# Patient Record
Sex: Female | Born: 1960 | Race: Black or African American | Hispanic: No | Marital: Married | Smoking: Never smoker
Health system: Southern US, Community
[De-identification: ages and names within clinical notes are randomized; demographics above are authoritative.]

## PROBLEM LIST (undated history)

## (undated) DIAGNOSIS — D649 Anemia, unspecified: Secondary | ICD-10-CM

## (undated) DIAGNOSIS — I10 Essential (primary) hypertension: Secondary | ICD-10-CM

## (undated) DIAGNOSIS — M1990 Unspecified osteoarthritis, unspecified site: Secondary | ICD-10-CM

## (undated) DIAGNOSIS — F32A Depression, unspecified: Secondary | ICD-10-CM

## (undated) DIAGNOSIS — T7840XA Allergy, unspecified, initial encounter: Secondary | ICD-10-CM

## (undated) DIAGNOSIS — J45909 Unspecified asthma, uncomplicated: Secondary | ICD-10-CM

## (undated) DIAGNOSIS — F329 Major depressive disorder, single episode, unspecified: Secondary | ICD-10-CM

## (undated) DIAGNOSIS — C801 Malignant (primary) neoplasm, unspecified: Secondary | ICD-10-CM

## (undated) DIAGNOSIS — F419 Anxiety disorder, unspecified: Secondary | ICD-10-CM

## (undated) HISTORY — PX: COLONOSCOPY: SHX174

## (undated) HISTORY — DX: Unspecified osteoarthritis, unspecified site: M19.90

## (undated) HISTORY — DX: Unspecified asthma, uncomplicated: J45.909

## (undated) HISTORY — DX: Allergy, unspecified, initial encounter: T78.40XA

## (undated) HISTORY — PX: HERNIA REPAIR: SHX51

## (undated) HISTORY — PX: TUBAL LIGATION: SHX77

## (undated) HISTORY — DX: Major depressive disorder, single episode, unspecified: F32.9

## (undated) HISTORY — DX: Depression, unspecified: F32.A

## (undated) HISTORY — DX: Anxiety disorder, unspecified: F41.9

## (undated) HISTORY — PX: ABDOMINAL HYSTERECTOMY: SHX81

---

## 1991-05-31 HISTORY — PX: CHOLECYSTECTOMY: SHX55

## 1997-08-15 ENCOUNTER — Other Ambulatory Visit: Admission: RE | Admit: 1997-08-15 | Discharge: 1997-08-15 | Payer: Self-pay | Admitting: Family Medicine

## 1998-08-27 ENCOUNTER — Other Ambulatory Visit: Admission: RE | Admit: 1998-08-27 | Discharge: 1998-08-27 | Payer: Self-pay | Admitting: Family Medicine

## 2000-02-25 ENCOUNTER — Encounter: Payer: Self-pay | Admitting: *Deleted

## 2000-02-25 ENCOUNTER — Inpatient Hospital Stay (HOSPITAL_COMMUNITY): Admission: AD | Admit: 2000-02-25 | Discharge: 2000-02-28 | Payer: Self-pay | Admitting: Obstetrics & Gynecology

## 2000-03-27 ENCOUNTER — Other Ambulatory Visit: Admission: RE | Admit: 2000-03-27 | Discharge: 2000-03-27 | Payer: Self-pay | Admitting: *Deleted

## 2000-05-15 ENCOUNTER — Ambulatory Visit (HOSPITAL_COMMUNITY): Admission: RE | Admit: 2000-05-15 | Discharge: 2000-05-15 | Payer: Self-pay | Admitting: Obstetrics and Gynecology

## 2000-05-15 ENCOUNTER — Encounter: Payer: Self-pay | Admitting: Obstetrics and Gynecology

## 2000-10-02 ENCOUNTER — Inpatient Hospital Stay (HOSPITAL_COMMUNITY): Admission: AD | Admit: 2000-10-02 | Discharge: 2000-10-04 | Payer: Self-pay | Admitting: Obstetrics and Gynecology

## 2000-10-08 ENCOUNTER — Encounter: Admission: RE | Admit: 2000-10-08 | Discharge: 2000-11-07 | Payer: Self-pay | Admitting: Obstetrics and Gynecology

## 2011-04-27 ENCOUNTER — Other Ambulatory Visit: Payer: Self-pay | Admitting: Internal Medicine

## 2011-04-27 DIAGNOSIS — Z1231 Encounter for screening mammogram for malignant neoplasm of breast: Secondary | ICD-10-CM

## 2011-05-16 ENCOUNTER — Ambulatory Visit
Admission: RE | Admit: 2011-05-16 | Discharge: 2011-05-16 | Disposition: A | Discharge status: Home / Self Care | Payer: Self-pay | Source: Ambulatory Visit | Attending: Internal Medicine | Admitting: Internal Medicine

## 2011-05-16 DIAGNOSIS — Z1231 Encounter for screening mammogram for malignant neoplasm of breast: Secondary | ICD-10-CM

## 2012-04-23 ENCOUNTER — Other Ambulatory Visit: Payer: Self-pay | Admitting: Obstetrics & Gynecology

## 2012-04-23 DIAGNOSIS — Z1231 Encounter for screening mammogram for malignant neoplasm of breast: Secondary | ICD-10-CM

## 2012-05-29 ENCOUNTER — Ambulatory Visit: Payer: Self-pay

## 2012-06-19 ENCOUNTER — Ambulatory Visit
Admission: RE | Admit: 2012-06-19 | Discharge: 2012-06-19 | Disposition: A | Discharge status: Home / Self Care | Payer: 59 | Source: Ambulatory Visit | Attending: Obstetrics & Gynecology | Admitting: Obstetrics & Gynecology

## 2012-06-19 DIAGNOSIS — Z1231 Encounter for screening mammogram for malignant neoplasm of breast: Secondary | ICD-10-CM

## 2013-04-19 ENCOUNTER — Other Ambulatory Visit: Payer: Self-pay | Admitting: Dermatology

## 2013-05-08 ENCOUNTER — Other Ambulatory Visit: Payer: Self-pay | Admitting: Internal Medicine

## 2013-05-08 DIAGNOSIS — E2839 Other primary ovarian failure: Secondary | ICD-10-CM

## 2013-06-11 ENCOUNTER — Other Ambulatory Visit: Payer: 59

## 2013-08-09 ENCOUNTER — Ambulatory Visit
Admission: RE | Admit: 2013-08-09 | Discharge: 2013-08-09 | Disposition: A | Discharge status: Home / Self Care | Payer: 59 | Source: Ambulatory Visit | Attending: Internal Medicine | Admitting: Internal Medicine

## 2013-08-09 DIAGNOSIS — E2839 Other primary ovarian failure: Secondary | ICD-10-CM

## 2013-08-19 ENCOUNTER — Ambulatory Visit (HOSPITAL_COMMUNITY)
Admission: RE | Admit: 2013-08-19 | Discharge: 2013-08-19 | Disposition: A | Discharge status: Home / Self Care | Payer: 59 | Source: Ambulatory Visit | Attending: Internal Medicine | Admitting: Internal Medicine

## 2013-08-19 ENCOUNTER — Other Ambulatory Visit: Payer: Self-pay

## 2013-08-19 ENCOUNTER — Other Ambulatory Visit (HOSPITAL_COMMUNITY): Payer: Self-pay | Admitting: Internal Medicine

## 2013-08-19 DIAGNOSIS — M1990 Unspecified osteoarthritis, unspecified site: Secondary | ICD-10-CM

## 2013-08-19 DIAGNOSIS — M25469 Effusion, unspecified knee: Secondary | ICD-10-CM | POA: Insufficient documentation

## 2013-08-19 DIAGNOSIS — Z1231 Encounter for screening mammogram for malignant neoplasm of breast: Secondary | ICD-10-CM

## 2013-08-19 DIAGNOSIS — M25569 Pain in unspecified knee: Secondary | ICD-10-CM | POA: Insufficient documentation

## 2013-08-19 DIAGNOSIS — M2340 Loose body in knee, unspecified knee: Secondary | ICD-10-CM | POA: Insufficient documentation

## 2013-08-19 DIAGNOSIS — M19019 Primary osteoarthritis, unspecified shoulder: Secondary | ICD-10-CM | POA: Insufficient documentation

## 2013-08-30 ENCOUNTER — Ambulatory Visit
Admission: RE | Admit: 2013-08-30 | Discharge: 2013-08-30 | Disposition: A | Discharge status: Home / Self Care | Payer: 59 | Source: Ambulatory Visit

## 2013-08-30 DIAGNOSIS — Z1231 Encounter for screening mammogram for malignant neoplasm of breast: Secondary | ICD-10-CM

## 2013-10-02 ENCOUNTER — Encounter: Payer: Self-pay | Admitting: Gastroenterology

## 2013-11-04 ENCOUNTER — Ambulatory Visit (AMBULATORY_SURGERY_CENTER): Payer: 59 | Admitting: *Deleted

## 2013-11-04 VITALS — Ht 65.0 in | Wt 239.4 lb

## 2013-11-04 DIAGNOSIS — Z1211 Encounter for screening for malignant neoplasm of colon: Secondary | ICD-10-CM

## 2013-11-04 MED ORDER — MOVIPREP 100 G PO SOLR: ORAL | Status: DC

## 2013-11-04 NOTE — Progress Notes (Signed)
Patient denies any allergies to eggs or soy. Patient denies any problems with anesthesia/sedation. Patient denies any oxygen use at home and does not take any diet/weight loss medications. EMMI education assisgned to patient on colonoscopy, this was explained and instructions given to patient. 

## 2013-11-13 ENCOUNTER — Encounter: Payer: Self-pay | Admitting: Gastroenterology

## 2013-11-18 ENCOUNTER — Encounter: Payer: Self-pay | Admitting: Gastroenterology

## 2013-11-18 ENCOUNTER — Ambulatory Visit (AMBULATORY_SURGERY_CENTER): Payer: 59 | Admitting: Gastroenterology

## 2013-11-18 VITALS — BP 143/84 | HR 73 | Temp 97.7°F | Resp 14 | Ht 68.0 in | Wt 239.0 lb

## 2013-11-18 DIAGNOSIS — Z1211 Encounter for screening for malignant neoplasm of colon: Secondary | ICD-10-CM

## 2013-11-18 MED ORDER — SODIUM CHLORIDE 0.9 % IV SOLN: 500.0000 mL | INTRAVENOUS | Status: DC

## 2013-11-18 NOTE — Patient Instructions (Signed)
YOU HAD AN ENDOSCOPIC PROCEDURE TODAY AT THE [facility]: Refer to the procedure report that was given to you for any specific questions about what was found during the examination.  If the procedure report does not answer your questions, please call your gastroenterologist to clarify.  If you requested that your care partner not be given the details of your procedure findings, then the procedure report has been included in a sealed envelope for you to review at your convenience later.  YOU SHOULD EXPECT: Some feelings of bloating in the abdomen. Passage of more gas than usual.  Walking can help get rid of the air that was put into your GI tract during the procedure and reduce the bloating. If you had a lower endoscopy (such as a colonoscopy or flexible sigmoidoscopy) you may notice spotting of blood in your stool or on the toilet paper. If you underwent a bowel prep for your procedure, then you may not have a normal bowel movement for a few days.  DIET: Your first meal following the procedure should be a light meal and then it is ok to progress to your normal diet.  A half-sandwich or bowl of soup is an example of a good first meal.  Heavy or fried foods are harder to digest and may make you feel nauseous or bloated.  Likewise meals heavy in dairy and vegetables can cause extra gas to form and this can also increase the bloating.  Drink plenty of fluids but you should avoid alcoholic beverages for 24 hours.  ACTIVITY: Your care partner should take you home directly after the procedure.  You should plan to take it easy, moving slowly for the rest of the day.  You can resume normal activity the day after the procedure however you should NOT DRIVE or use heavy machinery for 24 hours (because of the sedation medicines used during the test).    SYMPTOMS TO REPORT IMMEDIATELY: A gastroenterologist can be reached at any hour.  During normal business hours, 8:30 AM to 5:00 PM Monday through Friday,  call [phone].  After hours and on weekends, please call the GI answering service at [phone] who will take a message and have the physician on call contact you.   Following lower endoscopy (colonoscopy or flexible sigmoidoscopy):  Excessive amounts of blood in the stool  Significant tenderness or worsening of abdominal pains  Swelling of the abdomen that is new, acute  Fever of 100F or higher    FOLLOW UP: If any biopsies were taken you will be contacted by phone or by letter within the next 1-3 weeks.  Call your gastroenterologist if you have not heard about the biopsies in 3 weeks.  Our staff will call the home number listed on your records the next business day following your procedure to check on you and address any questions or concerns that you may have at that time regarding the information given to you following your procedure. This is a courtesy call and so if there is no answer at the home number and we have not heard from you through the emergency physician on call, we will assume that you have returned to your regular daily activities without incident.  SIGNATURES/CONFIDENTIALITY: You and/or your care partner have signed paperwork which will be entered into your electronic medical record.  These signatures attest to the fact that that the information above on your After Visit Summary has been reviewed and is understood.  Full responsibility of the confidentiality   of this discharge information lies with you and/or your care-partner.     

## 2013-11-18 NOTE — Op Note (Signed)
[facility]  [address] [place], [id number]   COLONOSCOPY PROCEDURE REPORT  PATIENT: Hannah, [name]  MR#: [id number] BIRTHDATE: 09-05-1960 , 52  yrs. old GENDER: Female ENDOSCOPIST: Milus Banister, MD REFERRED DP:OEUMP Jeanie Cooks, M.D. PROCEDURE DATE:  11/18/2013 PROCEDURE:   Colonoscopy, screening First Screening Colonoscopy - Avg.  risk and is 50 yrs.  old or older Yes.  Prior Negative Screening - Now for repeat screening. N/A  History of Adenoma - Now for follow-up colonoscopy & has been > or = to 3 yrs.  N/A  Polyps Removed Today? No.  Recommend repeat exam, <10 yrs? No. ASA CLASS:   Class II INDICATIONS:average risk screening. MEDICATIONS: MAC sedation, administered by CRNA and Propofol (Diprivan) 230 mg IV  DESCRIPTION OF PROCEDURE:   After the risks benefits and alternatives of the procedure were thoroughly explained, informed consent was obtained.  A digital rectal exam revealed no abnormalities of the rectum.   The LB [id number]  endoscope was introduced through the anus and advanced to the cecum, which was identified by both the appendix and ileocecal valve. No adverse events experienced.   The quality of the prep was good.  The instrument was then slowly withdrawn as the colon was fully examined.   COLON FINDINGS: A normal appearing cecum, ileocecal valve, and appendiceal orifice were identified.  The ascending, hepatic flexure, transverse, splenic flexure, descending, sigmoid colon and rectum appeared unremarkable.  No polyps or cancers were seen. Retroflexed views revealed no abnormalities. The time to cecum=2 minutes 42 seconds.  Withdrawal time=6 minutes 24 seconds.  The scope was withdrawn and the procedure completed. COMPLICATIONS: There were no complications.  ENDOSCOPIC IMPRESSION: Normal colon No polyps or cancers  RECOMMENDATIONS: You should continue to follow colorectal cancer screening guidelines for "routine risk"  patients with a repeat colonoscopy in 10 years.   eSigned:  Milus Banister, MD 11/18/2013 9:32 AM

## 2013-11-18 NOTE — Progress Notes (Signed)
Report to PACU, RN, vss, BBS= Clear.  

## 2013-11-19 ENCOUNTER — Telehealth: Payer: Self-pay | Admitting: *Deleted

## 2013-11-19 NOTE — Telephone Encounter (Signed)
  Follow up Call-  Call back number 11/18/2013  Post procedure Call Back phone  # [phone]  Permission to leave phone message Yes     Patient questions:  Do you have a fever, pain , or abdominal swelling? no Pain Score  0 *  Have you tolerated food without any problems? yes  Have you been able to return to your normal activities? yes  Do you have any questions about your discharge instructions: Diet   no Medications  no Follow up visit  no  Do you have questions or concerns about your Care? no  Actions: * If pain score is 4 or above: No action needed, pain <4.

## 2014-02-19 ENCOUNTER — Ambulatory Visit: Payer: 59 | Admitting: Obstetrics & Gynecology

## 2014-02-26 ENCOUNTER — Encounter: Payer: Self-pay | Admitting: Obstetrics & Gynecology

## 2014-02-26 ENCOUNTER — Ambulatory Visit (INDEPENDENT_AMBULATORY_CARE_PROVIDER_SITE_OTHER): Payer: 59 | Admitting: Obstetrics & Gynecology

## 2014-02-26 ENCOUNTER — Encounter: Payer: Self-pay | Admitting: *Deleted

## 2014-02-26 VITALS — Ht 65.0 in | Wt 245.0 lb

## 2014-02-26 DIAGNOSIS — Z01419 Encounter for gynecological examination (general) (routine) without abnormal findings: Secondary | ICD-10-CM

## 2014-02-26 NOTE — Patient Instructions (Signed)
Menopause Menopause is the normal time of life when menstrual periods stop completely. Menopause is complete when you have missed 12 consecutive menstrual periods. It usually occurs between the ages of 48 years and 55 years. Very rarely does a woman develop menopause before the age of 40 years. At menopause, your ovaries stop producing the female hormones estrogen and progesterone. This can cause undesirable symptoms and also affect your health. Sometimes the symptoms may occur 4-5 years before the menopause begins. There is no relationship between menopause and:  Oral contraceptives.  Number of children you had.  Race.  The age your menstrual periods started (menarche). Heavy smokers and very thin women may develop menopause earlier in life. CAUSES  The ovaries stop producing the female hormones estrogen and progesterone.  Other causes include:  Surgery to remove both ovaries.  The ovaries stop functioning for no known reason.  Tumors of the pituitary gland in the brain.  Medical disease that affects the ovaries and hormone production.  Radiation treatment to the abdomen or pelvis.  Chemotherapy that affects the ovaries. SYMPTOMS   Hot flashes.  Night sweats.  Decrease in sex drive.  Vaginal dryness and thinning of the vagina causing painful intercourse.  Dryness of the skin and developing wrinkles.  Headaches.  Tiredness.  Irritability.  Memory problems.  Weight gain.  Bladder infections.  Hair growth of the face and chest.  Infertility. More serious symptoms include:  Loss of bone (osteoporosis) causing breaks (fractures).  Depression.  Hardening and narrowing of the arteries (atherosclerosis) causing heart attacks and strokes. DIAGNOSIS   When the menstrual periods have stopped for 12 straight months.  Physical exam.  Hormone studies of the blood. TREATMENT  There are many treatment choices and nearly as many questions about them. The  decisions to treat or not to treat menopausal changes is an individual choice made with your health care provider. Your health care provider can discuss the treatments with you. Together, you can decide which treatment will work best for you. Your treatment choices may include:   Hormone therapy (estrogen and progesterone).  Non-hormonal medicines.  Treating the individual symptoms with medicine (for example antidepressants for depression).  Herbal medicines that may help specific symptoms.  Counseling by a psychiatrist or psychologist.  Group therapy.  Lifestyle changes including:  Eating healthy.  Regular exercise.  Limiting caffeine and alcohol.  Stress management and meditation.  No treatment. HOME CARE INSTRUCTIONS   Take the medicine your health care provider gives you as directed.  Get plenty of sleep and rest.  Exercise regularly.  Eat a diet that contains calcium (good for the bones) and soy products (acts like estrogen hormone).  Avoid alcoholic beverages.  Do not smoke.  If you have hot flashes, dress in layers.  Take supplements, calcium, and vitamin D to strengthen bones.  You can use over-the-counter lubricants or moisturizers for vaginal dryness.  Group therapy is sometimes very helpful.  Acupuncture may be helpful in some cases. SEEK MEDICAL CARE IF:   You are not sure you are in menopause.  You are having menopausal symptoms and need advice and treatment.  You are still having menstrual periods after age 55 years.  You have pain with intercourse.  Menopause is complete (no menstrual period for 12 months) and you develop vaginal bleeding.  You need a referral to a specialist (gynecologist, psychiatrist, or psychologist) for treatment. SEEK IMMEDIATE MEDICAL CARE IF:   You have severe depression.  You have excessive vaginal bleeding.    You fell and think you have a broken bone.  You have pain when you urinate.  You develop leg or  chest pain.  You have a fast pounding heart beat (palpitations).  You have severe headaches.  You develop vision problems.  You feel a lump in your breast.  You have abdominal pain or severe indigestion. Document Released: 08/06/2003 Document Revised: 01/16/2013 Document Reviewed: 12/13/2012 ExitCare Patient Information 2015 [employer]. This information is not intended to replace advice given to you by your health care provider. Make sure you discuss any questions you have with your health care provider.  

## 2014-02-26 NOTE — Progress Notes (Signed)
Subjective:     Hannah [name] is a 53 y.o. female here for a routine exam.  Current complaints: none.    Personal health questionnaire:  Is patient Hannah [name], have a family history of breast and/or ovarian cancer: no Is there a family history of uterine cancer diagnosed at age < 53, gastrointestinal cancer, urinary tract cancer, family member who is a Field seismologist syndrome-associated carrier: no Is the patient overweight and hypertensive, family history of diabetes, personal history of gestational diabetes or PCOS: yes Is patient over 69, have PCOS,  family history of premature CHD under age 19, diabetes, smoke, have hypertension or peripheral artery disease:  no At any time, has a partner hit, kicked or otherwise hurt or frightened you?: no Over the past 2 weeks, have you felt down, depressed or hopeless?: no Over the past 2 weeks, have you felt little interest or pleasure in doing things?:no   Gynecologic History Patient's last menstrual period was 02/15/2014. Last Pap results were: normal Last mammogram: 4/15. Results were: normal  Obstetric History OB History  Gravida Para Term Preterm AB SAB TAB Ectopic Multiple Living  5 3 3  2  2   3     # Outcome Date GA Lbr Len/2nd Weight Sex Delivery Anes PTL Lv  5 TRM         Y  4 TRM         Y  3 TRM         Y  2 TAB           1 TAB                Past Medical History  Diagnosis Date  . Allergy   . Asthma   . Arthritis     Past Surgical History  Procedure Laterality Date  . Cholecystectomy  1993  . Tubal ligation      Current outpatient prescriptions:meloxicam (MOBIC) 7.5 MG tablet, Take 7.5 mg by mouth daily., Disp: , Rfl: ;  Multiple Vitamin (MULTIVITAMIN) tablet, Take 1 tablet by mouth daily., Disp: , Rfl: ;  VITAMIN D, CHOLECALCIFEROL, PO, Take 1 tablet by mouth once a week., Disp: , Rfl:  Allergies  Allergen Reactions  . Sulfur Swelling    History  Substance Use Topics  . Smoking status: Never Smoker   . Smokeless  tobacco: Never Used  . Alcohol Use: No    Family History  Problem Relation Age of Onset  . Colon cancer Neg Hx   . Asthma Mother       Review of Systems  Constitutional: negative for fatigue and weight loss Respiratory: negative for cough and wheezing Cardiovascular: negative for chest pain, fatigue and palpitations Gastrointestinal: negative for abdominal pain and change in bowel habits Musculoskeletal:negative for myalgias Neurological: negative for gait problems and tremors Behavioral/Psych: negative for abusive relationship, depression Endocrine: negative for temperature intolerance   Genitourinary:negative for abnormal menstrual periods, genital lesions, hot flashes, sexual problems and vaginal discharge Integument/breast: negative for breast lump, breast tenderness, nipple discharge and skin lesion(s)    Objective:       Ht 5\' 5"  (1.651 m)  Wt 111.131 kg (245 lb)  BMI 40.77 kg/m2  LMP 02/15/2014 General:   alert  Skin:   no rash or abnormalities  Lungs:   clear to auscultation bilaterally  Heart:   regular rate and rhythm, S1, S2 normal, no murmur, click, rub or gallop  Breasts:   normal without suspicious masses, skin or nipple changes  or axillary nodes  Abdomen:  normal findings: no organomegaly, soft, non-tender and no hernia  Pelvis:  External genitalia: normal general appearance Urinary system: urethral meatus normal and bladder without fullness, nontender Vaginal: normal without tenderness, induration or masses Cervix: normal appearance Adnexa: normal bimanual exam Uterus: anteverted and non-tender, normal size   Lab Review Urine pregnancy test Labs reviewed no Radiologic studies reviewed yes    Assessment:    Healthy female exam.    Plan:    Education reviewed: calcium supplements, low fat, low cholesterol diet and weight bearing exercise.   Meds ordered this encounter  Medications  . meloxicam (MOBIC) 7.5 MG tablet    Sig: Take 7.5 mg by  mouth daily.   Follow up as needed.

## 2014-02-27 LAB — PAP IG AND HPV HIGH-RISK: HPV DNA HIGH RISK: NOT DETECTED

## 2014-03-31 ENCOUNTER — Encounter: Payer: Self-pay | Admitting: Obstetrics & Gynecology

## 2014-05-26 ENCOUNTER — Encounter: Payer: Self-pay | Admitting: *Deleted

## 2014-05-27 ENCOUNTER — Encounter: Payer: Self-pay | Admitting: Obstetrics & Gynecology

## 2014-06-12 ENCOUNTER — Encounter (HOSPITAL_BASED_OUTPATIENT_CLINIC_OR_DEPARTMENT_OTHER): Payer: Self-pay | Admitting: *Deleted

## 2014-06-12 ENCOUNTER — Other Ambulatory Visit (HOSPITAL_BASED_OUTPATIENT_CLINIC_OR_DEPARTMENT_OTHER): Payer: Self-pay | Admitting: Orthopaedic Surgery

## 2014-06-12 NOTE — Progress Notes (Signed)
No labs needed

## 2014-06-18 ENCOUNTER — Encounter (HOSPITAL_BASED_OUTPATIENT_CLINIC_OR_DEPARTMENT_OTHER): Payer: Self-pay | Admitting: Anesthesiology

## 2014-06-18 ENCOUNTER — Ambulatory Visit (HOSPITAL_BASED_OUTPATIENT_CLINIC_OR_DEPARTMENT_OTHER): Payer: 59 | Admitting: Anesthesiology

## 2014-06-18 ENCOUNTER — Ambulatory Visit (HOSPITAL_BASED_OUTPATIENT_CLINIC_OR_DEPARTMENT_OTHER)
Admission: RE | Admit: 2014-06-18 | Discharge: 2014-06-18 | Disposition: A | Discharge status: Home / Self Care | Payer: 59 | Source: Ambulatory Visit | Attending: Orthopaedic Surgery | Admitting: Orthopaedic Surgery

## 2014-06-18 ENCOUNTER — Encounter (HOSPITAL_BASED_OUTPATIENT_CLINIC_OR_DEPARTMENT_OTHER)
Admission: RE | Disposition: A | Discharge status: Home / Self Care | Payer: Self-pay | Source: Ambulatory Visit | Attending: Orthopaedic Surgery

## 2014-06-18 DIAGNOSIS — M65331 Trigger finger, right middle finger: Secondary | ICD-10-CM | POA: Diagnosis not present

## 2014-06-18 DIAGNOSIS — Z6841 Body Mass Index (BMI) 40.0 and over, adult: Secondary | ICD-10-CM | POA: Diagnosis not present

## 2014-06-18 DIAGNOSIS — J45909 Unspecified asthma, uncomplicated: Secondary | ICD-10-CM | POA: Insufficient documentation

## 2014-06-18 DIAGNOSIS — M1990 Unspecified osteoarthritis, unspecified site: Secondary | ICD-10-CM | POA: Diagnosis not present

## 2014-06-18 HISTORY — PX: TRIGGER FINGER RELEASE: SHX641

## 2014-06-18 LAB — POCT HEMOGLOBIN-HEMACUE: HEMOGLOBIN: 11.3 g/dL — AB (ref 12.0–15.0)

## 2014-06-18 SURGERY — RELEASE, A1 PULLEY, FOR TRIGGER FINGER
Anesthesia: Monitor Anesthesia Care | Site: Finger | Laterality: Right

## 2014-06-18 MED ORDER — HYDROMORPHONE HCL 1 MG/ML IJ SOLN: 0.2500 mg | INTRAMUSCULAR | Status: DC | PRN

## 2014-06-18 MED ORDER — OXYCODONE HCL 5 MG/5ML PO SOLN: 5.0000 mg | Freq: Once | ORAL | Status: DC | PRN

## 2014-06-18 MED ORDER — PROPOFOL INFUSION 10 MG/ML OPTIME
INTRAVENOUS | Status: DC | PRN
  Administered 2014-06-18: 100 ug/kg/min via INTRAVENOUS

## 2014-06-18 MED ORDER — OXYCODONE HCL 5 MG PO TABS: 5.0000 mg | ORAL_TABLET | Freq: Once | ORAL | Status: DC | PRN

## 2014-06-18 MED ORDER — LIDOCAINE HCL (PF) 0.5 % IJ SOLN
INTRAMUSCULAR | Status: DC | PRN
  Administered 2014-06-18: 30 mL via INTRAVENOUS

## 2014-06-18 MED ORDER — CEFAZOLIN SODIUM-DEXTROSE 2-3 GM-% IV SOLR
INTRAVENOUS | Status: AC
  Filled 2014-06-18: qty 50

## 2014-06-18 MED ORDER — CEFAZOLIN SODIUM-DEXTROSE 2-3 GM-% IV SOLR
2.0000 g | INTRAVENOUS | Status: AC
  Administered 2014-06-18: 2 g via INTRAVENOUS

## 2014-06-18 MED ORDER — 0.9 % SODIUM CHLORIDE (POUR BTL) OPTIME
TOPICAL | Status: DC | PRN
  Administered 2014-06-18: 120 mL

## 2014-06-18 MED ORDER — ONDANSETRON HCL 4 MG/2ML IJ SOLN: 4.0000 mg | Freq: Once | INTRAMUSCULAR | Status: DC | PRN

## 2014-06-18 MED ORDER — LACTATED RINGERS IV SOLN
INTRAVENOUS | Status: DC
  Administered 2014-06-18: 08:00:00 via INTRAVENOUS

## 2014-06-18 MED ORDER — MIDAZOLAM HCL 5 MG/5ML IJ SOLN
INTRAMUSCULAR | Status: DC | PRN
  Administered 2014-06-18: 2 mg via INTRAVENOUS

## 2014-06-18 MED ORDER — BUPIVACAINE HCL (PF) 0.25 % IJ SOLN
INTRAMUSCULAR | Status: DC | PRN
  Administered 2014-06-18: 6 mL

## 2014-06-18 MED ORDER — FENTANYL CITRATE 0.05 MG/ML IJ SOLN
INTRAMUSCULAR | Status: DC | PRN
  Administered 2014-06-18: 100 ug via INTRAVENOUS

## 2014-06-18 MED ORDER — MIDAZOLAM HCL 2 MG/2ML IJ SOLN
INTRAMUSCULAR | Status: AC
  Filled 2014-06-18: qty 2

## 2014-06-18 MED ORDER — BUPIVACAINE HCL (PF) 0.25 % IJ SOLN
INTRAMUSCULAR | Status: AC
  Filled 2014-06-18: qty 30

## 2014-06-18 MED ORDER — FENTANYL CITRATE 0.05 MG/ML IJ SOLN: 50.0000 ug | INTRAMUSCULAR | Status: DC | PRN

## 2014-06-18 MED ORDER — HYDROCODONE-ACETAMINOPHEN 5-325 MG PO TABS
1.0000 | ORAL_TABLET | Freq: Four times a day (QID) | ORAL | Status: DC | PRN
Start: 2014-06-18 — End: 2015-03-12

## 2014-06-18 MED ORDER — FENTANYL CITRATE 0.05 MG/ML IJ SOLN
INTRAMUSCULAR | Status: AC
  Filled 2014-06-18: qty 4

## 2014-06-18 MED ORDER — MIDAZOLAM HCL 2 MG/2ML IJ SOLN: 1.0000 mg | INTRAMUSCULAR | Status: DC | PRN

## 2014-06-18 MED ORDER — PROPOFOL 10 MG/ML IV BOLUS
INTRAVENOUS | Status: AC
  Filled 2014-06-18: qty 20

## 2014-06-18 MED ORDER — ONDANSETRON HCL 4 MG/2ML IJ SOLN
INTRAMUSCULAR | Status: DC | PRN
  Administered 2014-06-18: 4 mg via INTRAVENOUS

## 2014-06-18 SURGICAL SUPPLY — 40 items
BANDAGE ELASTIC 3 VELCRO ST LF (GAUZE/BANDAGES/DRESSINGS) ×2 IMPLANT
BLADE MINI RND TIP GREEN BEAV (BLADE) ×1 IMPLANT
BLADE SURG 15 STRL LF DISP TIS (BLADE) ×1 IMPLANT
BLADE SURG 15 STRL SS (BLADE) ×2
BNDG CMPR 9X4 STRL LF SNTH (GAUZE/BANDAGES/DRESSINGS) ×1
BNDG ESMARK 4X9 LF (GAUZE/BANDAGES/DRESSINGS) ×2 IMPLANT
BRUSH SCRUB EZ PLAIN DRY (MISCELLANEOUS) ×2 IMPLANT
CORDS BIPOLAR (ELECTRODE) ×2 IMPLANT
COVER BACK TABLE 60X90IN (DRAPES) ×2 IMPLANT
COVER MAYO STAND STRL (DRAPES) ×2 IMPLANT
CUFF TOURNIQUET SINGLE 18IN (TOURNIQUET CUFF) ×1 IMPLANT
DECANTER SPIKE VIAL GLASS SM (MISCELLANEOUS) ×1 IMPLANT
DRAPE EXTREMITY T 121X128X90 (DRAPE) ×2 IMPLANT
DRAPE SURG 17X23 STRL (DRAPES) ×2 IMPLANT
GAUZE SPONGE 4X4 12PLY STRL (GAUZE/BANDAGES/DRESSINGS) ×2 IMPLANT
GAUZE SPONGE 4X4 16PLY XRAY LF (GAUZE/BANDAGES/DRESSINGS) IMPLANT
GAUZE XEROFORM 1X8 LF (GAUZE/BANDAGES/DRESSINGS) ×2 IMPLANT
GLOVE BIOGEL PI IND STRL 7.0 (GLOVE) IMPLANT
GLOVE BIOGEL PI INDICATOR 7.0 (GLOVE) ×1
GLOVE ECLIPSE 6.5 STRL STRAW (GLOVE) ×1 IMPLANT
GLOVE EXAM NITRILE MD LF STRL (GLOVE) ×1 IMPLANT
GLOVE NEODERM STRL 7.5 LF PF (GLOVE) ×1 IMPLANT
GLOVE SURG NEODERM 7.5  LF PF (GLOVE) ×1
GLOVE SURG SYN 7.5  E (GLOVE) ×1
GLOVE SURG SYN 7.5 E (GLOVE) ×1 IMPLANT
GLOVE SURG SYN 7.5 PF PI (GLOVE) ×1 IMPLANT
GOWN PREVENTION PLUS XLARGE (GOWN DISPOSABLE) ×2 IMPLANT
GOWN STRL REIN XL XLG (GOWN DISPOSABLE) ×2 IMPLANT
NEEDLE HYPO 22GX1.5 SAFETY (NEEDLE) ×1 IMPLANT
NS IRRIG 1000ML POUR BTL (IV SOLUTION) ×2 IMPLANT
PACK BASIN DAY SURGERY FS (CUSTOM PROCEDURE TRAY) ×2 IMPLANT
PAD CAST 3X4 CTTN HI CHSV (CAST SUPPLIES) ×1 IMPLANT
PADDING CAST COTTON 3X4 STRL (CAST SUPPLIES) ×2
STOCKINETTE 4X48 STRL (DRAPES) ×2 IMPLANT
SUT ETHILON 4 0 PS 2 18 (SUTURE) ×1 IMPLANT
SYR BULB 3OZ (MISCELLANEOUS) ×2 IMPLANT
SYR CONTROL 10ML LL (SYRINGE) ×1 IMPLANT
TOWEL OR 17X24 6PK STRL BLUE (TOWEL DISPOSABLE) ×4 IMPLANT
TRAY DSU PREP LF (CUSTOM PROCEDURE TRAY) ×2 IMPLANT
UNDERPAD 30X30 INCONTINENT (UNDERPADS AND DIAPERS) ×2 IMPLANT

## 2014-06-18 NOTE — Op Note (Signed)
Date of surgery: 06/18/2014  Preoperative diagnosis: Right long trigger finger  Postoperative diagnosis: Same  Procedure: Incision of A1 pulley of right long trigger finger  Surgeon: Eduard Roux, M.D.  Anesthesia: Bier block  Estimated blood loss: Minimal  Complications: None next  Condition to PACU: Stable  Indications for procedure: Mr. Zettlemoyer is a 54 year old female who presents for surgical treatment of her trigger finger after failing conservative treatment. The risks benefits alternatives to surgery were discussed with the patient and she understood and wished to proceed.  Description of procedure: The patient was identified in the preoperative holding area. The operative site was marked by the surgeon confirmed with the patient. He is brought back to the operating room. She was placed supine on table. A nonsterile tourniquet was placed on the upper forearm. The extremity was exsanguinated using Esmarch bandage and the tourniquet was inflated to 250 mmHg. The Bier block was administered. The right upper extremity was prepped and draped in standard sterile fashion. Timeout was performed. Antibiotics were given. Timeout was performed. A horizontal incision based in the distal palmar crease in line with the long finger was used. Blunt dissection was taken down to the level of the flexor tendon. The neurovascular bundles were identified on each side of the tendon sheath and protected. The possible edge of the A1 pulley was identified. This was sharply incised. Of note the tendon was of good quality and did not exhibit any tears. The A1 pulley was incised along its full width. Care was taken not to violate the A2 pulley. The palmar pulley was then visualized and released also. The tourniquet was then deflated and hemostasis was obtained. Local anesthesia was infiltrated. The wound was thoroughly irrigated and closed with 3-0 nylon sutures. Sterile dressings were applied and the hand was placed in  a soft dressing. Patient tolerated the procedure well and was taken to the PACU in stable condition.  Postoperative plan: Patient will be weightbearing as tolerated to the right hand she is to avoid heavy lifting for 4 weeks. We will follow-up with her for suture removal as scheduled.  Azucena Cecil, MD [place] 9:29 AM

## 2014-06-18 NOTE — Transfer of Care (Signed)
Immediate Anesthesia Transfer of Care Note  Patient: Hannah [name]  Procedure(s) Performed: Procedure(s): RIGHT LONG FINGER TRIGGER RELEASE (Right)  Patient Location: PACU  Anesthesia Type:MAC and Bier block  Level of Consciousness: sedated  Airway & Oxygen Therapy: Patient Spontanous Breathing and Patient connected to face mask oxygen  Post-op Assessment: Report given to PACU RN and Post -op Vital signs reviewed and stable  Post vital signs: Reviewed and stable  Complications: No apparent anesthesia complications

## 2014-06-18 NOTE — Anesthesia Preprocedure Evaluation (Signed)
Anesthesia Evaluation  Patient identified by MRN, date of birth, ID band Patient awake    Reviewed: Allergy & Precautions, NPO status , Patient's Chart, lab work & pertinent test results  Airway Mallampati: I  TM Distance: >3 FB Neck ROM: Full    Dental  (+) Teeth Intact, Dental Advisory Given   Pulmonary asthma (rare use of inhaler) ,  breath sounds clear to auscultation        Cardiovascular Rhythm:Regular Rate:Normal     Neuro/Psych    GI/Hepatic   Endo/Other  Morbid obesity  Renal/GU      Musculoskeletal   Abdominal   Peds  Hematology   Anesthesia Other Findings   Reproductive/Obstetrics                             Anesthesia Physical Anesthesia Plan  ASA: II  Anesthesia Plan: MAC and Bier Block   Post-op Pain Management:    Induction: Intravenous  Airway Management Planned: Simple Face Mask  Additional Equipment:   Intra-op Plan:   Post-operative Plan:   Informed Consent: I have reviewed the patients History and Physical, chart, labs and discussed the procedure including the risks, benefits and alternatives for the proposed anesthesia with the patient or authorized representative who has indicated his/her understanding and acceptance.   Dental advisory given  Plan Discussed with: CRNA, Anesthesiologist and Surgeon  Anesthesia Plan Comments:         Anesthesia Quick Evaluation

## 2014-06-18 NOTE — Anesthesia Postprocedure Evaluation (Signed)
  Anesthesia Post-op Note  Patient: Hannah [name]  Procedure(s) Performed: Procedure(s): RIGHT LONG FINGER TRIGGER RELEASE (Right)  Patient Location: PACU  Anesthesia Type: MAC, Bier Block   Level of Consciousness: awake, alert  and oriented  Airway and Oxygen Therapy: Patient Spontanous Breathing  Post-op Pain: none  Post-op Assessment: Post-op Vital signs reviewed  Post-op Vital Signs: Reviewed  Last Vitals:  Filed Vitals:   06/18/14 0945  BP: 159/89  Pulse: 69  Temp:   Resp: 15    Complications: No apparent anesthesia complications

## 2014-06-18 NOTE — H&P (Signed)
PREOPERATIVE H&P  Chief Complaint: Right long finger trigger finger  HPI: Hannah [name] is a 54 y.o. female who presents for surgical treatment of Right long finger trigger finger.  She denies any changes in medical history.  Past Medical History  Diagnosis Date  . Allergy   . Asthma   . Arthritis    Past Surgical History  Procedure Laterality Date  . Cholecystectomy  1993  . Tubal ligation     History   Social History  . Marital Status: Married    Spouse Name: N/A    Number of Children: N/A  . Years of Education: N/A   Social History Main Topics  . Smoking status: Never Smoker   . Smokeless tobacco: Never Used  . Alcohol Use: No  . Drug Use: No  . Sexual Activity:    Partners: Male    Birth Control/ Protection: Surgical   Other Topics Concern  . None   Social History Narrative   Family History  Problem Relation Age of Onset  . Colon cancer Neg Hx   . Asthma Mother    Allergies  Allergen Reactions  . Sulfur Swelling   Prior to Admission medications   Medication Sig Start Date End Date Taking? Authorizing Provider  meloxicam (MOBIC) 7.5 MG tablet Take 7.5 mg by mouth daily.    Historical Provider, MD  Multiple Vitamin (MULTIVITAMIN) tablet Take 1 tablet by mouth daily.    Historical Provider, MD  VITAMIN D, CHOLECALCIFEROL, PO Take 1 tablet by mouth once a week.    Historical Provider, MD     Positive ROS: All other systems have been reviewed and were otherwise negative with the exception of those mentioned in the HPI and as above.  Physical Exam: General: Alert, no acute distress Cardiovascular: No pedal edema Respiratory: No cyanosis, no use of accessory musculature GI: abdomen soft Skin: No lesions in the area of chief complaint Neurologic: Sensation intact distally Psychiatric: Patient is competent for consent with normal mood and affect Lymphatic: no lymphedema  MUSCULOSKELETAL: exam stable  Assessment: Right long finger trigger  finger  Plan: Plan for Procedure(s): RIGHT LONG FINGER TRIGGER RELEASE  The risks benefits and alternatives were discussed with the patient including but not limited to the risks of nonoperative treatment, versus surgical intervention including infection, bleeding, nerve injury,  blood clots, cardiopulmonary complications, morbidity, mortality, among others, and they were willing to proceed.   [name], MD   06/18/2014 7:30 AM

## 2014-06-18 NOTE — Discharge Instructions (Signed)
Postoperative instructions:  Weightbearing: Weight bear as tolerated, no heavy lifting  Keep your dressing and/or splint clean and dry at all times.  You can remove your dressing on post-operative day #3 and change with a dry/sterile dressing or Band-Aids as needed thereafter.    Incision instructions:  Do not soak your incision for 3 weeks after surgery.  If the incision gets wet, pat dry and do not scrub the incision.  Pain control:  You have been given a prescription to be taken as directed for post-operative pain control.  In addition, elevate the operative extremity above the heart at all times to prevent swelling and throbbing pain.  Take over-the-counter Colace, 100mg  by mouth twice a day while taking narcotic pain medications to help prevent constipation.  Follow up appointments: 1) 10-14 days for suture removal and wound check. 2) Hannah [name] as scheduled.   -------------------------------------------------------------------------------------------------------------  After Surgery Pain Control:  After your surgery, post-surgical discomfort or pain is likely. This discomfort can last several days to a few weeks. At certain times of the day your discomfort may be more intense.  Did you receive a nerve block?  A nerve block can provide pain relief for one hour to two days after your surgery. As long as the nerve block is working, you will experience little or no sensation in the area the surgeon operated on.  As the nerve block wears off, you will begin to experience pain or discomfort. It is very important that you begin taking your prescribed pain medication before the nerve block fully wears off. Treating your pain at the first sign of the block wearing off will ensure your pain is better controlled and more tolerable when full-sensation returns. Do not wait until the pain is intolerable, as the medicine will be less effective. It is better to treat pain in advance than to try and catch  up.  General Anesthesia:  If you did not receive a nerve block during your surgery, you will need to start taking your pain medication shortly after your surgery and should continue to do so as prescribed by your surgeon.  Pain Medication:  Most commonly we prescribe Vicodin and Percocet for post-operative pain. Both of these medications contain a combination of acetaminophen (Tylenol) and a narcotic to help control pain.   It takes between 30 and 45 minutes before pain medication starts to work. It is important to take your medication before your pain level gets too intense.   Nausea is a common side effect of many pain medications. You will want to eat something before taking your pain medicine to help prevent nausea.   If you are taking a prescription pain medication that contains acetaminophen, we recommend that you do not take additional over the counter acetaminophen (Tylenol).  Other pain relieving options:   Using a cold pack to ice the affected area a few times a day (15 to 20 minutes at a time) can help to relieve pain, reduce swelling and bruising.   Elevation of the affected area can also help to reduce pain and swelling.    Call your surgeon if you experience:   1.  Fever over 101.0. 2.  Inability to urinate. 3.  Nausea and/or vomiting. 4.  Extreme swelling or bruising at the surgical site. 5.  Continued bleeding from the incision. 6.  Increased pain, redness or drainage from the incision. 7.  Problems related to your pain medication. 8. Any change in color, movement and/or sensation 9. Any problems  and/or concerns   Post Anesthesia Home Care Instructions  Activity: Get plenty of rest for the remainder of the day. A responsible adult should stay with you for 24 hours following the procedure.  For the next 24 hours, DO NOT: -Drive a car -Paediatric nurse -Drink alcoholic beverages -Take any medication unless instructed by your physician -Make any legal  decisions or sign important papers.  Meals: Start with liquid foods such as gelatin or soup. Progress to regular foods as tolerated. Avoid greasy, spicy, heavy foods. If nausea and/or vomiting occur, drink only clear liquids until the nausea and/or vomiting subsides. Call your physician if vomiting continues.  Special Instructions/Symptoms: Your throat may feel dry or sore from the anesthesia or the breathing tube placed in your throat during surgery. If this causes discomfort, gargle with warm salt water. The discomfort should disappear within 24 hours.

## 2014-06-19 ENCOUNTER — Encounter (HOSPITAL_BASED_OUTPATIENT_CLINIC_OR_DEPARTMENT_OTHER): Payer: Self-pay | Admitting: Orthopaedic Surgery

## 2015-03-12 ENCOUNTER — Other Ambulatory Visit: Payer: Self-pay

## 2015-03-12 ENCOUNTER — Encounter: Payer: Self-pay | Admitting: Obstetrics

## 2015-03-12 ENCOUNTER — Ambulatory Visit (INDEPENDENT_AMBULATORY_CARE_PROVIDER_SITE_OTHER): Payer: 59 | Admitting: Obstetrics

## 2015-03-12 VITALS — BP 135/89 | HR 96 | Temp 99.0°F | Ht 65.0 in | Wt 241.0 lb

## 2015-03-12 DIAGNOSIS — Z01419 Encounter for gynecological examination (general) (routine) without abnormal findings: Secondary | ICD-10-CM | POA: Diagnosis not present

## 2015-03-12 DIAGNOSIS — Z1231 Encounter for screening mammogram for malignant neoplasm of breast: Secondary | ICD-10-CM

## 2015-03-12 NOTE — Addendum Note (Signed)
Addended by: Lewie Loron D on: 03/12/2015 05:15 PM   Modules accepted: Orders

## 2015-03-12 NOTE — Progress Notes (Signed)
Subjective:        Hannah [name] is a 54 y.o. female here for a routine exam.  Current complaints: None.    Personal health questionnaire:  Is patient [name], have a family history of breast and/or ovarian cancer: no Is there a family history of uterine cancer diagnosed at age < 30, gastrointestinal cancer, urinary tract cancer, family member who is a Field seismologist syndrome-associated carrier: no Is the patient overweight and hypertensive, family history of diabetes, personal history of gestational diabetes, preeclampsia or PCOS: no Is patient over 41, have PCOS,  family history of premature CHD under age 79, diabetes, smoke, have hypertension or peripheral artery disease:  no At any time, has a partner hit, kicked or otherwise hurt or frightened you?: no Over the past 2 weeks, have you felt down, depressed or hopeless?: no Over the past 2 weeks, have you felt little interest or pleasure in doing things?:no   Gynecologic History Patient's last menstrual period was 02/16/2015. Contraception: tubal ligation Last Pap: 2015. Results were: normal Last mammogram: 2015. Results were: normal  Obstetric History OB History  Gravida Para Term Preterm AB SAB TAB Ectopic Multiple Living  5 3 3  2  2   3     # Outcome Date GA Lbr Len/2nd Weight Sex Delivery Anes PTL Lv  5 Term         Y  4 Term         Y  3 Term         Y  2 TAB           1 TAB               Past Medical History  Diagnosis Date  . Allergy   . Asthma   . Arthritis     Past Surgical History  Procedure Laterality Date  . Cholecystectomy  1993  . Tubal ligation    . Trigger finger release Right 06/18/2014    Procedure: RIGHT LONG FINGER TRIGGER RELEASE;  Surgeon: [name], MD;  Location: [place];  Service: Orthopedics;  Laterality: Right;     Current outpatient prescriptions:  .  B Complex-C (SUPER B COMPLEX PO), Take by mouth., Disp: , Rfl:  .  glucosamine-chondroitin 500-400 MG  tablet, Take 1 tablet by mouth 3 (three) times daily., Disp: , Rfl:  .  hydrochlorothiazide (HYDRODIURIL) 12.5 MG tablet, Take 12.5 mg by mouth daily., Disp: , Rfl:  .  Multiple Vitamin (MULTIVITAMIN) tablet, Take 1 tablet by mouth daily., Disp: , Rfl:  .  Omega-3 Fatty Acids (FISH OIL) 1000 MG CAPS, Take by mouth., Disp: , Rfl:  .  VITAMIN D, CHOLECALCIFEROL, PO, Take 1 tablet by mouth once a week., Disp: , Rfl:  .  meloxicam (MOBIC) 7.5 MG tablet, Take 7.5 mg by mouth daily., Disp: , Rfl:  Allergies  Allergen Reactions  . Sulfur Swelling    Social History  Substance Use Topics  . Smoking status: Never Smoker   . Smokeless tobacco: Never Used  . Alcohol Use: No    Family History  Problem Relation Age of Onset  . Colon cancer Neg Hx   . Asthma Mother       Review of Systems  Constitutional: negative for fatigue and weight loss Respiratory: negative for cough and wheezing Cardiovascular: negative for chest pain, fatigue and palpitations Gastrointestinal: negative for abdominal pain and change in bowel habits Musculoskeletal:negative for myalgias Neurological: negative for gait problems  and tremors Behavioral/Psych: negative for abusive relationship, depression Endocrine: negative for temperature intolerance   Genitourinary:negative for abnormal menstrual periods, genital lesions, hot flashes, sexual problems and vaginal discharge Integument/breast: negative for breast lump, breast tenderness, nipple discharge and skin lesion(s)    Objective:       BP 135/89 mmHg  Pulse 96  Temp(Src) 99 F (37.2 C)  Ht 5\' 5"  (1.651 m)  Wt 241 lb (109.317 kg)  BMI 40.10 kg/m2  LMP 02/16/2015 General:   alert  Skin:   no rash or abnormalities  Lungs:   clear to auscultation bilaterally  Heart:   regular rate and rhythm, S1, S2 normal, no murmur, click, rub or gallop  Breasts:   normal without suspicious masses, skin or nipple changes or axillary nodes  Abdomen:  normal findings: no  organomegaly, soft, non-tender and no hernia  Pelvis:  External genitalia: normal general appearance Urinary system: urethral meatus normal and bladder without fullness, nontender Vaginal: normal without tenderness, induration or masses Cervix: normal appearance Adnexa: normal bimanual exam Uterus: anteverted and non-tender, normal size   Lab Review Urine pregnancy test Labs reviewed no Radiologic studies reviewed no    Assessment:    Healthy female exam.    Plan:    Education reviewed: calcium supplements, low fat, low cholesterol diet, self breast exams and weight bearing exercise. Follow up in: 1 year.   Meds ordered this encounter  Medications  . hydrochlorothiazide (HYDRODIURIL) 12.5 MG tablet    Sig: Take 12.5 mg by mouth daily.  . B Complex-C (SUPER B COMPLEX PO)    Sig: Take by mouth.  Marland Kitchen glucosamine-chondroitin 500-400 MG tablet    Sig: Take 1 tablet by mouth 3 (three) times daily.  . Omega-3 Fatty Acids (FISH OIL) 1000 MG CAPS    Sig: Take by mouth.   No orders of the defined types were placed in this encounter.

## 2015-03-12 NOTE — Patient Instructions (Signed)

## 2015-03-14 LAB — PAP, TP IMAGING W/ HPV RNA, RFLX HPV TYPE 16,18/45: HPV mRNA, High Risk: NOT DETECTED

## 2015-03-18 LAB — SURESWAB, VAGINOSIS/VAGINITIS PLUS
Atopobium vaginae: 7.8 Log (cells/mL)
C. ALBICANS, DNA: NOT DETECTED
C. GLABRATA, DNA: NOT DETECTED
C. TRACHOMATIS RNA, TMA: NOT DETECTED
C. TROPICALIS, DNA: NOT DETECTED
C. parapsilosis, DNA: NOT DETECTED
LACTOBACILLUS SPECIES: NOT DETECTED Log (cells/mL)
MEGASPHAERA SPECIES: >8 Log (cells/mL)
N. GONORRHOEAE RNA, TMA: NOT DETECTED
T. vaginalis RNA, QL TMA: NOT DETECTED

## 2015-03-19 ENCOUNTER — Other Ambulatory Visit: Payer: Self-pay | Admitting: Obstetrics

## 2015-03-19 DIAGNOSIS — B9689 Other specified bacterial agents as the cause of diseases classified elsewhere: Secondary | ICD-10-CM

## 2015-03-19 DIAGNOSIS — N760 Acute vaginitis: Principal | ICD-10-CM

## 2015-03-19 MED ORDER — METRONIDAZOLE 500 MG PO TABS
500.0000 mg | ORAL_TABLET | Freq: Two times a day (BID) | ORAL | Status: DC
Start: 2015-03-19 — End: 2016-01-13

## 2015-03-31 ENCOUNTER — Ambulatory Visit: Payer: 59

## 2015-05-18 ENCOUNTER — Ambulatory Visit: Payer: 59

## 2015-10-18 ENCOUNTER — Encounter (HOSPITAL_COMMUNITY): Payer: Self-pay

## 2015-10-18 ENCOUNTER — Emergency Department (HOSPITAL_COMMUNITY)
Admission: EM | Admit: 2015-10-18 | Discharge: 2015-10-18 | Disposition: A | Discharge status: Home / Self Care | Payer: 59 | Attending: Emergency Medicine | Admitting: Emergency Medicine

## 2015-10-18 DIAGNOSIS — K047 Periapical abscess without sinus: Secondary | ICD-10-CM | POA: Insufficient documentation

## 2015-10-18 DIAGNOSIS — J45909 Unspecified asthma, uncomplicated: Secondary | ICD-10-CM | POA: Insufficient documentation

## 2015-10-18 DIAGNOSIS — Z79899 Other long term (current) drug therapy: Secondary | ICD-10-CM | POA: Insufficient documentation

## 2015-10-18 DIAGNOSIS — K0889 Other specified disorders of teeth and supporting structures: Secondary | ICD-10-CM | POA: Diagnosis present

## 2015-10-18 DIAGNOSIS — Z792 Long term (current) use of antibiotics: Secondary | ICD-10-CM | POA: Insufficient documentation

## 2015-10-18 MED ORDER — NAPROXEN 500 MG PO TABS: 500.0000 mg | ORAL_TABLET | Freq: Two times a day (BID) | ORAL | Status: DC

## 2015-10-18 MED ORDER — PENICILLIN V POTASSIUM 500 MG PO TABS: 500.0000 mg | ORAL_TABLET | Freq: Four times a day (QID) | ORAL | Status: DC

## 2015-10-18 NOTE — ED Provider Notes (Signed)
CSN: [id number]     Arrival date & time 10/18/15  0734 History   First MD Initiated Contact with Patient 10/18/15 0750     Chief Complaint  Patient presents with  . Dental Pain     (Consider location/radiation/quality/duration/timing/severity/associated sxs/prior Treatment) The history is provided by the patient and medical records.    55 year old female here with left lower dental pain. One of patient's left lower molar is scheduled to be extracted by oral surgeon on 10/31/2015. She reports she's had intermittent swelling of her left lower jaw for the past week, worse over the past 24 hours. She denies any fever or chills. No difficulty swallowing.  She is not currently on any type of antibiotic. No history of diabetes.  Past Medical History  Diagnosis Date  . Allergy   . Asthma   . Arthritis    Past Surgical History  Procedure Laterality Date  . Cholecystectomy  1993  . Tubal ligation    . Trigger finger release Right 06/18/2014    Procedure: RIGHT LONG FINGER TRIGGER RELEASE;  Surgeon: [name], MD;  Location: [place];  Service: Orthopedics;  Laterality: Right;   Family History  Problem Relation Age of Onset  . Colon cancer Neg Hx   . Asthma Mother    Social History  Substance Use Topics  . Smoking status: Never Smoker   . Smokeless tobacco: Never Used  . Alcohol Use: No   OB History    Gravida Para Term Preterm AB TAB SAB Ectopic Multiple Living   5 3 3  2 2    3      Review of Systems  HENT: Positive for dental problem.   All other systems reviewed and are negative.     Allergies  Sulfur  Home Medications   Prior to Admission medications   Medication Sig Start Date End Date Taking? Authorizing Provider  B Complex-C (SUPER B COMPLEX PO) Take by mouth.    Historical Provider, MD  glucosamine-chondroitin 500-400 MG tablet Take 1 tablet by mouth 3 (three) times daily.    Historical Provider, MD  hydrochlorothiazide (HYDRODIURIL)  12.5 MG tablet Take 12.5 mg by mouth daily.    Historical Provider, MD  meloxicam (MOBIC) 7.5 MG tablet Take 7.5 mg by mouth daily.    Historical Provider, MD  metroNIDAZOLE (FLAGYL) 500 MG tablet Take 1 tablet (500 mg total) by mouth 2 (two) times daily. 03/19/15   Shelly Bombard, MD  Multiple Vitamin (MULTIVITAMIN) tablet Take 1 tablet by mouth daily.    Historical Provider, MD  Omega-3 Fatty Acids (FISH OIL) 1000 MG CAPS Take by mouth.    Historical Provider, MD  VITAMIN D, CHOLECALCIFEROL, PO Take 1 tablet by mouth once a week.    Historical Provider, MD   BP 172/112 mmHg  Pulse 100  Temp(Src) 98.6 F (37 C) (Oral)  Resp 18  Ht 5\' 7"  (1.702 m)  Wt 108.863 kg  BMI 37.58 kg/m2  SpO2 96%   Physical Exam  Constitutional: She is oriented to person, place, and time. She appears well-developed and well-nourished. No distress.  HENT:  Head: Normocephalic and atraumatic.  Mouth/Throat: Uvula is midline, oropharynx is clear and moist and mucous membranes are normal. Abnormal dentition. Dental abscesses and dental caries present. No oropharyngeal exudate, posterior oropharyngeal edema, posterior oropharyngeal erythema or tonsillar abscesses.  Teeth largely in fair dentition, left lower molar broken with large defect, surrounding gingiva swollen and appears irritated, handling secretions appropriately, no trismus,  mild swelling of left lower cheek without extension into neck, normal phonation without stridor  Eyes: Conjunctivae and EOM are normal. Pupils are equal, round, and reactive to light.  Neck: Normal range of motion. Neck supple.  Cardiovascular: Normal rate, regular rhythm and normal heart sounds.   Pulmonary/Chest: Effort normal and breath sounds normal. No respiratory distress. She has no wheezes.  Musculoskeletal: Normal range of motion. She exhibits no edema.  Neurological: She is alert and oriented to person, place, and time.  Skin: Skin is warm and dry. She is not diaphoretic.   Psychiatric: She has a normal mood and affect.  Nursing note and vitals reviewed.   ED Course  Procedures (including critical care time) Labs Review Labs Reviewed - No data to display  Imaging Review No results found. I have personally reviewed and evaluated these images and lab results as part of my medical decision-making.   EKG Interpretation None      MDM   Final diagnoses:  Dental abscess   55 year old female here with left lower dental pain. Tooth is scheduled to be started on 11/27/2015 by oral surgeon. Reports worsening pain in swelling last night. Patient is afebrile, nontoxic. She does have mild swelling of her left lower cheek without extension into neck. She is handling her secretions well, normal phonation without stridor. Currently drinking a smoothie. Do not clinically suspect Ludwig's angina at this time. Will start on antibiotics, naprosyn for pain. Patient has already notified her dentist of current situation-- may follow-up with them later this week if not improving.  Discussed plan with patient, he/she acknowledged understanding and agreed with plan of care.  Return precautions given for new or worsening symptoms.  Larene Pickett, PA-C 10/18/15 [id number]  Carmin Muskrat, MD 10/18/15 [phone]

## 2015-10-18 NOTE — Discharge Instructions (Signed)
Take the prescribed medication as directed. Follow-up with your dentist later this week if not improving or if symptoms worsen. Return to the ED for new concerns.

## 2015-10-18 NOTE — ED Notes (Signed)
Patient here with known bad tooth left lower, needs pulled and unable to see oral surgeon until 6/3. Here for increased pain and jaw swelling

## 2015-11-25 ENCOUNTER — Inpatient Hospital Stay: Admit: 2015-11-25 | Payer: 59 | Admitting: Orthopaedic Surgery

## 2015-11-25 SURGERY — ARTHROPLASTY, KNEE, TOTAL
Anesthesia: Spinal | Site: Knee | Laterality: Right

## 2016-01-13 ENCOUNTER — Other Ambulatory Visit: Payer: Self-pay | Admitting: Orthopaedic Surgery

## 2016-01-14 NOTE — Pre-Procedure Instructions (Signed)
Hannah [name]  01/14/2016     Your procedure is scheduled on : Thursday January 28, 2016 at 12:15 PM.  Report to [facility] Admitting at 10:15 AM.  Call this number if you have problems the morning of surgery: [phone]    Remember:  Do not eat food or drink liquids after midnight.  Take these medicines the morning of surgery with A SIP OF WATER : Symbicort inhaler if needed   Stop taking any vitamins, herbal medications/supplements, NSAIDs, Ibuprofen, Advil, Motrin, Aleve, Meloxicam/Mobic, etc on Thursday August 24th   Do not wear jewelry, make-up or nail polish.  Do not wear lotions, powders, or perfumes.    Do not shave 48 hours prior to surgery.    Do not bring valuables to the hospital.  [facility] is not responsible for any belongings or valuables.  Contacts, dentures or bridgework may not be worn into surgery.  Leave your suitcase in the car.  After surgery it may be brought to your room.  For patients admitted to the hospital, discharge time will be determined by your treatment team.  Patients discharged the day of surgery will not be allowed to drive home.   Name and phone number of your driver:    Special instructions:  Shower using CHG soap the night before and the morning of your surgery  Please read over the following fact sheets that you were given. MRSA Information and Surgical Site Infection Prevention

## 2016-01-15 ENCOUNTER — Encounter (HOSPITAL_COMMUNITY)
Admission: RE | Admit: 2016-01-15 | Discharge: 2016-01-15 | Disposition: A | Discharge status: Home / Self Care | Payer: 59 | Source: Ambulatory Visit | Attending: Orthopaedic Surgery | Admitting: Orthopaedic Surgery

## 2016-01-15 ENCOUNTER — Other Ambulatory Visit: Payer: Self-pay

## 2016-01-15 ENCOUNTER — Encounter (HOSPITAL_COMMUNITY): Payer: Self-pay

## 2016-01-15 DIAGNOSIS — M1711 Unilateral primary osteoarthritis, right knee: Secondary | ICD-10-CM | POA: Insufficient documentation

## 2016-01-15 DIAGNOSIS — Z0183 Encounter for blood typing: Secondary | ICD-10-CM | POA: Insufficient documentation

## 2016-01-15 DIAGNOSIS — I10 Essential (primary) hypertension: Secondary | ICD-10-CM | POA: Insufficient documentation

## 2016-01-15 DIAGNOSIS — R9431 Abnormal electrocardiogram [ECG] [EKG]: Secondary | ICD-10-CM | POA: Insufficient documentation

## 2016-01-15 DIAGNOSIS — Z01818 Encounter for other preprocedural examination: Secondary | ICD-10-CM | POA: Diagnosis not present

## 2016-01-15 DIAGNOSIS — Z01812 Encounter for preprocedural laboratory examination: Secondary | ICD-10-CM | POA: Insufficient documentation

## 2016-01-15 HISTORY — DX: Essential (primary) hypertension: I10

## 2016-01-15 LAB — CBC WITH DIFFERENTIAL/PLATELET
BASOS ABS: 0 10*3/uL (ref 0.0–0.1)
Basophils Relative: 0 %
EOS ABS: 0.1 10*3/uL (ref 0.0–0.7)
Eosinophils Relative: 1 %
HCT: 33.7 % — ABNORMAL LOW (ref 36.0–46.0)
HEMOGLOBIN: 10.6 g/dL — AB (ref 12.0–15.0)
LYMPHS ABS: 2.2 10*3/uL (ref 0.7–4.0)
Lymphocytes Relative: 28 %
MCH: 22.4 pg — AB (ref 26.0–34.0)
MCHC: 31.5 g/dL (ref 30.0–36.0)
MCV: 71.1 fL — ABNORMAL LOW (ref 78.0–100.0)
Monocytes Absolute: 0.3 10*3/uL (ref 0.1–1.0)
Monocytes Relative: 4 %
NEUTROS PCT: 67 %
Neutro Abs: 5.3 10*3/uL (ref 1.7–7.7)
Platelets: 262 10*3/uL (ref 150–400)
RBC: 4.74 MIL/uL (ref 3.87–5.11)
RDW: 16.4 % — ABNORMAL HIGH (ref 11.5–15.5)
WBC: 8 10*3/uL (ref 4.0–10.5)

## 2016-01-15 LAB — COMPREHENSIVE METABOLIC PANEL
ALBUMIN: 3.4 g/dL — AB (ref 3.5–5.0)
ALK PHOS: 71 U/L (ref 38–126)
ALT: 10 U/L — AB (ref 14–54)
AST: 12 U/L — AB (ref 15–41)
Anion gap: 6 (ref 5–15)
BUN: 12 mg/dL (ref 6–20)
CALCIUM: 8.5 mg/dL — AB (ref 8.9–10.3)
CO2: 27 mmol/L (ref 22–32)
CREATININE: 0.85 mg/dL (ref 0.44–1.00)
Chloride: 104 mmol/L (ref 101–111)
GFR calc non Af Amer: >60 mL/min (ref >60)
GLUCOSE: 94 mg/dL (ref 65–99)
Potassium: 3.8 mmol/L (ref 3.5–5.1)
SODIUM: 137 mmol/L (ref 135–145)
Total Bilirubin: 0.5 mg/dL (ref 0.3–1.2)
Total Protein: 7.7 g/dL (ref 6.5–8.1)

## 2016-01-15 LAB — URINALYSIS, ROUTINE W REFLEX MICROSCOPIC
Bilirubin Urine: NEGATIVE
Glucose, UA: NEGATIVE mg/dL
Hgb urine dipstick: NEGATIVE
KETONES UR: NEGATIVE mg/dL
NITRITE: NEGATIVE
PROTEIN: NEGATIVE mg/dL
Specific Gravity, Urine: 1.023 (ref 1.005–1.030)
pH: 5.5 (ref 5.0–8.0)

## 2016-01-15 LAB — C-REACTIVE PROTEIN: CRP: 0.6 mg/dL (ref <1.0)

## 2016-01-15 LAB — URINE MICROSCOPIC-ADD ON: RBC / HPF: NONE SEEN RBC/hpf (ref 0–5)

## 2016-01-15 LAB — PROTIME-INR
INR: 1.01
Prothrombin Time: 13.3 seconds (ref 11.4–15.2)

## 2016-01-15 LAB — TYPE AND SCREEN
ABO/RH(D): B NEG
Antibody Screen: NEGATIVE

## 2016-01-15 LAB — HCG, SERUM, QUALITATIVE: Preg, Serum: NEGATIVE

## 2016-01-15 LAB — SURGICAL PCR SCREEN
MRSA, PCR: NEGATIVE
Staphylococcus aureus: NEGATIVE

## 2016-01-15 LAB — APTT: aPTT: 32 seconds (ref 24–36)

## 2016-01-15 LAB — ABO/RH: ABO/RH(D): B NEG

## 2016-01-15 LAB — SEDIMENTATION RATE: Sed Rate: 43 mm/hr — ABNORMAL HIGH (ref 0–22)

## 2016-01-15 NOTE — Progress Notes (Signed)
   01/15/16 0820  OBSTRUCTIVE SLEEP APNEA  Have you ever been diagnosed with sleep apnea through a sleep study? No  Do you snore loudly (loud enough to be heard through closed doors)?  1  Do you often feel tired, fatigued, or sleepy during the daytime (such as falling asleep during driving or talking to someone)? 0  Has anyone observed you stop breathing during your sleep? 0  Do you have, or are you being treated for high blood pressure? 1  BMI more than 35 kg/m2? 1  Age > 50 (1-yes) 1  Neck circumference greater than:Female 16 inches or larger, Female 17inches or larger? 1  Female Gender (Yes=1) 0  Obstructive Sleep Apnea Score 5  Score 5 or greater  Results sent to PCP   This patient has screened at risk for sleep apnea using the STOP Bang tool used during a pre-surgical visit. A score of 5 or greater is at risk for sleep apnea.

## 2016-01-15 NOTE — Progress Notes (Signed)
PCP is Hannah [name]  Patient denied having any acute cardiac issues, but did inform Nurse that she had asthma and uses Symbicort inhaler two puffs once a day if needed. Symbicort was not a listed medication on med rec. Medication was added.   Patient stayed on cell phone during the entire PAT visit, and only looked up a few times when spoken to.   Large regular knee high TED hose needed, however there were not any available on unit. Nurse sent a A-11 down to material management.

## 2016-01-27 MED ORDER — BUPIVACAINE LIPOSOME 1.3 % IJ SUSP
20.0000 mL | Freq: Once | INTRAMUSCULAR | Status: DC
  Filled 2016-01-27: qty 20

## 2016-01-27 MED ORDER — SODIUM CHLORIDE 0.9 % IV SOLN
1000.0000 mg | INTRAVENOUS | Status: AC
  Administered 2016-01-28: 1000 mg via INTRAVENOUS
  Filled 2016-01-27: qty 10

## 2016-01-28 ENCOUNTER — Inpatient Hospital Stay (HOSPITAL_COMMUNITY): Payer: 59 | Admitting: Certified Registered Nurse Anesthetist

## 2016-01-28 ENCOUNTER — Encounter (HOSPITAL_COMMUNITY): Payer: Self-pay | Admitting: *Deleted

## 2016-01-28 ENCOUNTER — Encounter (HOSPITAL_COMMUNITY)
Admission: RE | Disposition: A | Discharge status: Home Health | Payer: Self-pay | Source: Ambulatory Visit | Attending: Orthopaedic Surgery

## 2016-01-28 ENCOUNTER — Inpatient Hospital Stay (HOSPITAL_COMMUNITY): Payer: 59

## 2016-01-28 ENCOUNTER — Inpatient Hospital Stay (HOSPITAL_COMMUNITY)
Admission: RE | Admit: 2016-01-28 | Discharge: 2016-01-30 | DRG: 470 | Disposition: A | Discharge status: Home Health | Payer: 59 | Source: Ambulatory Visit | Attending: Orthopaedic Surgery | Admitting: Orthopaedic Surgery

## 2016-01-28 DIAGNOSIS — Z79899 Other long term (current) drug therapy: Secondary | ICD-10-CM | POA: Diagnosis not present

## 2016-01-28 DIAGNOSIS — J45909 Unspecified asthma, uncomplicated: Secondary | ICD-10-CM | POA: Diagnosis present

## 2016-01-28 DIAGNOSIS — Z791 Long term (current) use of non-steroidal anti-inflammatories (NSAID): Secondary | ICD-10-CM | POA: Diagnosis not present

## 2016-01-28 DIAGNOSIS — M1711 Unilateral primary osteoarthritis, right knee: Principal | ICD-10-CM | POA: Diagnosis present

## 2016-01-28 DIAGNOSIS — I10 Essential (primary) hypertension: Secondary | ICD-10-CM | POA: Diagnosis present

## 2016-01-28 DIAGNOSIS — Z96659 Presence of unspecified artificial knee joint: Secondary | ICD-10-CM

## 2016-01-28 HISTORY — PX: TOTAL KNEE ARTHROPLASTY: SHX125

## 2016-01-28 SURGERY — ARTHROPLASTY, KNEE, TOTAL
Anesthesia: Monitor Anesthesia Care | Site: Knee | Laterality: Right

## 2016-01-28 MED ORDER — ONDANSETRON HCL 4 MG/2ML IJ SOLN
INTRAMUSCULAR | Status: AC
  Filled 2016-01-28: qty 2

## 2016-01-28 MED ORDER — ADULT MULTIVITAMIN W/MINERALS CH
1.0000 | ORAL_TABLET | Freq: Every day | ORAL | Status: DC
  Administered 2016-01-29 – 2016-01-30 (×2): 1 via ORAL
  Filled 2016-01-28 (×2): qty 1

## 2016-01-28 MED ORDER — SENNOSIDES-DOCUSATE SODIUM 8.6-50 MG PO TABS: 1.0000 | ORAL_TABLET | Freq: Every evening | ORAL | 1 refills | Status: DC | PRN

## 2016-01-28 MED ORDER — ASPIRIN EC 325 MG PO TBEC
325.0000 mg | DELAYED_RELEASE_TABLET | Freq: Two times a day (BID) | ORAL | 0 refills | Status: DC
Start: 2016-01-28 — End: 2016-10-31

## 2016-01-28 MED ORDER — SODIUM CHLORIDE 0.9 % IR SOLN
Status: DC | PRN
  Administered 2016-01-28: 1000 mL
  Administered 2016-01-28: 3000 mL

## 2016-01-28 MED ORDER — TRANEXAMIC ACID 1000 MG/10ML IV SOLN
2000.0000 mg | INTRAVENOUS | Status: DC
  Filled 2016-01-28: qty 20

## 2016-01-28 MED ORDER — METHOCARBAMOL 500 MG PO TABS
ORAL_TABLET | ORAL | Status: AC
  Filled 2016-01-28: qty 1

## 2016-01-28 MED ORDER — ONE-DAILY MULTI VITAMINS PO TABS: 1.0000 | ORAL_TABLET | Freq: Every day | ORAL | Status: DC

## 2016-01-28 MED ORDER — METHOCARBAMOL 750 MG PO TABS: 750.0000 mg | ORAL_TABLET | Freq: Two times a day (BID) | ORAL | 0 refills | Status: DC | PRN

## 2016-01-28 MED ORDER — METHOCARBAMOL 500 MG PO TABS
500.0000 mg | ORAL_TABLET | Freq: Four times a day (QID) | ORAL | Status: DC | PRN
  Administered 2016-01-28 – 2016-01-30 (×2): 500 mg via ORAL
  Filled 2016-01-28 (×2): qty 1

## 2016-01-28 MED ORDER — METOPROLOL TARTRATE 5 MG/5ML IV SOLN
INTRAVENOUS | Status: AC
  Filled 2016-01-28: qty 5

## 2016-01-28 MED ORDER — FENTANYL CITRATE (PF) 100 MCG/2ML IJ SOLN
INTRAMUSCULAR | Status: AC
  Administered 2016-01-28: 50 ug via INTRAVENOUS
  Filled 2016-01-28: qty 2

## 2016-01-28 MED ORDER — SALINE FLUSH 0.9 % IV SOLN
INTRAVENOUS | Status: DC | PRN
  Administered 2016-01-28: 40 mL

## 2016-01-28 MED ORDER — LIDOCAINE 2% (20 MG/ML) 5 ML SYRINGE
INTRAMUSCULAR | Status: DC | PRN
  Administered 2016-01-28 (×2): 20 mg via INTRAVENOUS

## 2016-01-28 MED ORDER — DEXAMETHASONE SODIUM PHOSPHATE 10 MG/ML IJ SOLN
10.0000 mg | Freq: Once | INTRAMUSCULAR | Status: AC
  Administered 2016-01-29: 10 mg via INTRAVENOUS
  Filled 2016-01-28: qty 1

## 2016-01-28 MED ORDER — GLYCOPYRROLATE 0.2 MG/ML IV SOSY
PREFILLED_SYRINGE | INTRAVENOUS | Status: AC
Start: 2016-01-28 — End: 2016-01-28
  Filled 2016-01-28: qty 3

## 2016-01-28 MED ORDER — CHLORHEXIDINE GLUCONATE 4 % EX LIQD: 60.0000 mL | Freq: Once | CUTANEOUS | Status: DC

## 2016-01-28 MED ORDER — MIDAZOLAM HCL 2 MG/2ML IJ SOLN
INTRAMUSCULAR | Status: AC
  Filled 2016-01-28: qty 2

## 2016-01-28 MED ORDER — POLYETHYLENE GLYCOL 3350 17 G PO PACK
17.0000 g | PACK | Freq: Every day | ORAL | Status: DC | PRN
  Administered 2016-01-30: 17 g via ORAL
  Filled 2016-01-28: qty 1

## 2016-01-28 MED ORDER — CEFAZOLIN SODIUM-DEXTROSE 2-4 GM/100ML-% IV SOLN
2.0000 g | INTRAVENOUS | Status: AC
  Administered 2016-01-28: 2 g via INTRAVENOUS
  Filled 2016-01-28: qty 100

## 2016-01-28 MED ORDER — TRANEXAMIC ACID 1000 MG/10ML IV SOLN
INTRAVENOUS | Status: DC | PRN
  Administered 2016-01-28: 2000 mg via TOPICAL

## 2016-01-28 MED ORDER — GLYCOPYRROLATE 0.2 MG/ML IJ SOLN
INTRAMUSCULAR | Status: DC | PRN
  Administered 2016-01-28: 0.2 mg via INTRAVENOUS

## 2016-01-28 MED ORDER — METOCLOPRAMIDE HCL 5 MG PO TABS: 5.0000 mg | ORAL_TABLET | Freq: Three times a day (TID) | ORAL | Status: DC | PRN

## 2016-01-28 MED ORDER — FENTANYL CITRATE (PF) 100 MCG/2ML IJ SOLN
25.0000 ug | INTRAMUSCULAR | Status: DC | PRN
  Administered 2016-01-28 (×2): 50 ug via INTRAVENOUS

## 2016-01-28 MED ORDER — SODIUM CHLORIDE 0.9 % IJ SOLN: INTRAMUSCULAR | Status: DC | PRN

## 2016-01-28 MED ORDER — SORBITOL 70 % SOLN: 30.0000 mL | Freq: Every day | Status: DC | PRN

## 2016-01-28 MED ORDER — METOCLOPRAMIDE HCL 5 MG/ML IJ SOLN: 5.0000 mg | Freq: Three times a day (TID) | INTRAMUSCULAR | Status: DC | PRN

## 2016-01-28 MED ORDER — ONDANSETRON HCL 4 MG/2ML IJ SOLN
INTRAMUSCULAR | Status: DC | PRN
  Administered 2016-01-28: 4 mg via INTRAVENOUS

## 2016-01-28 MED ORDER — LIDOCAINE 2% (20 MG/ML) 5 ML SYRINGE
INTRAMUSCULAR | Status: AC
  Filled 2016-01-28: qty 5

## 2016-01-28 MED ORDER — ONDANSETRON HCL 4 MG/2ML IJ SOLN: 4.0000 mg | Freq: Four times a day (QID) | INTRAMUSCULAR | Status: DC | PRN

## 2016-01-28 MED ORDER — FENTANYL CITRATE (PF) 100 MCG/2ML IJ SOLN
INTRAMUSCULAR | Status: AC
  Filled 2016-01-28: qty 2

## 2016-01-28 MED ORDER — KETOROLAC TROMETHAMINE 30 MG/ML IJ SOLN
30.0000 mg | Freq: Four times a day (QID) | INTRAMUSCULAR | Status: DC | PRN
  Administered 2016-01-28: 30 mg via INTRAVENOUS

## 2016-01-28 MED ORDER — METHOCARBAMOL 1000 MG/10ML IJ SOLN
500.0000 mg | Freq: Four times a day (QID) | INTRAVENOUS | Status: DC | PRN
  Filled 2016-01-28: qty 5

## 2016-01-28 MED ORDER — FENTANYL CITRATE (PF) 100 MCG/2ML IJ SOLN
INTRAMUSCULAR | Status: DC | PRN
  Administered 2016-01-28 (×2): 50 ug via INTRAVENOUS

## 2016-01-28 MED ORDER — MIDAZOLAM HCL 2 MG/2ML IJ SOLN
INTRAMUSCULAR | Status: DC | PRN
  Administered 2016-01-28 (×2): 1 mg via INTRAVENOUS

## 2016-01-28 MED ORDER — ALBUTEROL SULFATE HFA 108 (90 BASE) MCG/ACT IN AERS
INHALATION_SPRAY | RESPIRATORY_TRACT | Status: DC | PRN
  Administered 2016-01-28: 2 via RESPIRATORY_TRACT

## 2016-01-28 MED ORDER — KETOROLAC TROMETHAMINE 30 MG/ML IJ SOLN
INTRAMUSCULAR | Status: AC
  Administered 2016-01-28: 30 mg via INTRAVENOUS
  Filled 2016-01-28: qty 1

## 2016-01-28 MED ORDER — HYDROCHLOROTHIAZIDE 25 MG PO TABS
12.5000 mg | ORAL_TABLET | Freq: Every day | ORAL | Status: DC
  Administered 2016-01-29 – 2016-01-30 (×2): 12.5 mg via ORAL
  Filled 2016-01-28 (×2): qty 1

## 2016-01-28 MED ORDER — OXYCODONE HCL 5 MG PO TABS
5.0000 mg | ORAL_TABLET | ORAL | Status: DC | PRN
  Administered 2016-01-28: 10 mg via ORAL
  Administered 2016-01-28: 15 mg via ORAL
  Administered 2016-01-29 (×2): 10 mg via ORAL
  Administered 2016-01-29: 15 mg via ORAL
  Administered 2016-01-29: 10 mg via ORAL
  Filled 2016-01-28: qty 3
  Filled 2016-01-28 (×4): qty 2

## 2016-01-28 MED ORDER — PHENOL 1.4 % MT LIQD: 1.0000 | OROMUCOSAL | Status: DC | PRN

## 2016-01-28 MED ORDER — OXYCODONE HCL ER 10 MG PO T12A: 10.0000 mg | EXTENDED_RELEASE_TABLET | Freq: Two times a day (BID) | ORAL | 0 refills | Status: DC

## 2016-01-28 MED ORDER — PROPOFOL 1000 MG/100ML IV EMUL
INTRAVENOUS | Status: AC
  Filled 2016-01-28: qty 200

## 2016-01-28 MED ORDER — ONDANSETRON HCL 4 MG PO TABS: 4.0000 mg | ORAL_TABLET | Freq: Three times a day (TID) | ORAL | 0 refills | Status: DC | PRN

## 2016-01-28 MED ORDER — VITAMIN D (ERGOCALCIFEROL) 1.25 MG (50000 UNIT) PO CAPS
50000.0000 [IU] | ORAL_CAPSULE | ORAL | Status: DC
  Administered 2016-01-29: 50000 [IU] via ORAL
  Filled 2016-01-28: qty 1

## 2016-01-28 MED ORDER — PROPOFOL 10 MG/ML IV BOLUS
INTRAVENOUS | Status: AC
  Filled 2016-01-28: qty 20

## 2016-01-28 MED ORDER — DEXTROSE 5 % IV SOLN
INTRAVENOUS | Status: DC | PRN
  Administered 2016-01-28: 25 ug/min via INTRAVENOUS

## 2016-01-28 MED ORDER — OXYCODONE HCL ER 10 MG PO T12A
10.0000 mg | EXTENDED_RELEASE_TABLET | Freq: Two times a day (BID) | ORAL | Status: DC
  Administered 2016-01-28 – 2016-01-30 (×4): 10 mg via ORAL
  Filled 2016-01-28 (×4): qty 1

## 2016-01-28 MED ORDER — ONDANSETRON HCL 4 MG PO TABS: 4.0000 mg | ORAL_TABLET | Freq: Four times a day (QID) | ORAL | Status: DC | PRN

## 2016-01-28 MED ORDER — OXYCODONE HCL 5 MG PO TABS: 5.0000 mg | ORAL_TABLET | Freq: Once | ORAL | Status: DC | PRN

## 2016-01-28 MED ORDER — CEFAZOLIN SODIUM-DEXTROSE 2-4 GM/100ML-% IV SOLN
2.0000 g | Freq: Four times a day (QID) | INTRAVENOUS | Status: AC
  Administered 2016-01-28 – 2016-01-29 (×2): 2 g via INTRAVENOUS
  Filled 2016-01-28 (×2): qty 100

## 2016-01-28 MED ORDER — PROPOFOL 10 MG/ML IV BOLUS
INTRAVENOUS | Status: DC | PRN
  Administered 2016-01-28 (×2): 30 mg via INTRAVENOUS
  Administered 2016-01-28: 10 mg via INTRAVENOUS
  Administered 2016-01-28: 30 mg via INTRAVENOUS
  Administered 2016-01-28 (×2): 50 mg via INTRAVENOUS

## 2016-01-28 MED ORDER — METOPROLOL TARTARATE 1 MG/ML SYRINGE (5ML)
Status: DC | PRN
  Administered 2016-01-28: 2 mg via INTRAVENOUS
  Administered 2016-01-28: 1 mg via INTRAVENOUS
  Administered 2016-01-28 (×2): 2.5 mg via INTRAVENOUS
  Administered 2016-01-28: 2 mg via INTRAVENOUS

## 2016-01-28 MED ORDER — GLUCOSAMINE-CHONDROITIN 500-400 MG PO TABS: 1.0000 | ORAL_TABLET | Freq: Every day | ORAL | Status: DC

## 2016-01-28 MED ORDER — ONDANSETRON HCL 4 MG/2ML IJ SOLN: 4.0000 mg | Freq: Once | INTRAMUSCULAR | Status: DC | PRN

## 2016-01-28 MED ORDER — ASPIRIN EC 325 MG PO TBEC
325.0000 mg | DELAYED_RELEASE_TABLET | Freq: Two times a day (BID) | ORAL | Status: DC
  Administered 2016-01-29 – 2016-01-30 (×3): 325 mg via ORAL
  Filled 2016-01-28 (×3): qty 1

## 2016-01-28 MED ORDER — BUPIVACAINE LIPOSOME 1.3 % IJ SUSP
INTRAMUSCULAR | Status: DC | PRN
  Administered 2016-01-28: 20 mL

## 2016-01-28 MED ORDER — MAGNESIUM CITRATE PO SOLN: 1.0000 | Freq: Once | ORAL | Status: DC | PRN

## 2016-01-28 MED ORDER — MENTHOL 3 MG MT LOZG: 1.0000 | LOZENGE | OROMUCOSAL | Status: DC | PRN

## 2016-01-28 MED ORDER — DIPHENHYDRAMINE HCL 12.5 MG/5ML PO ELIX: 25.0000 mg | ORAL_SOLUTION | ORAL | Status: DC | PRN

## 2016-01-28 MED ORDER — MORPHINE SULFATE (PF) 2 MG/ML IV SOLN
1.0000 mg | INTRAVENOUS | Status: DC | PRN
  Administered 2016-01-28: 1 mg via INTRAVENOUS

## 2016-01-28 MED ORDER — ACETAMINOPHEN 500 MG PO TABS
1000.0000 mg | ORAL_TABLET | Freq: Four times a day (QID) | ORAL | Status: AC
  Administered 2016-01-28 – 2016-01-29 (×3): 1000 mg via ORAL
  Filled 2016-01-28 (×3): qty 2

## 2016-01-28 MED ORDER — OXYCODONE HCL 5 MG/5ML PO SOLN: 5.0000 mg | Freq: Once | ORAL | Status: DC | PRN

## 2016-01-28 MED ORDER — 0.9 % SODIUM CHLORIDE (POUR BTL) OPTIME
TOPICAL | Status: DC | PRN
  Administered 2016-01-28: 1000 mL

## 2016-01-28 MED ORDER — TETRACAINE HCL 1 % IJ SOLN
INTRAMUSCULAR | Status: AC
  Filled 2016-01-28: qty 2

## 2016-01-28 MED ORDER — ACETAMINOPHEN 325 MG PO TABS: 650.0000 mg | ORAL_TABLET | Freq: Four times a day (QID) | ORAL | Status: DC | PRN

## 2016-01-28 MED ORDER — OXYCODONE HCL 5 MG PO TABS
ORAL_TABLET | ORAL | Status: AC
  Administered 2016-01-28: 15 mg via ORAL
  Filled 2016-01-28: qty 3

## 2016-01-28 MED ORDER — PROPOFOL 500 MG/50ML IV EMUL
INTRAVENOUS | Status: DC | PRN
  Administered 2016-01-28: 25 ug/kg/min via INTRAVENOUS
  Administered 2016-01-28: 14:00:00 via INTRAVENOUS

## 2016-01-28 MED ORDER — OXYCODONE HCL 5 MG PO TABS: 5.0000 mg | ORAL_TABLET | ORAL | 0 refills | Status: DC | PRN

## 2016-01-28 MED ORDER — ACETAMINOPHEN 650 MG RE SUPP: 650.0000 mg | Freq: Four times a day (QID) | RECTAL | Status: DC | PRN

## 2016-01-28 MED ORDER — ALUM & MAG HYDROXIDE-SIMETH 200-200-20 MG/5ML PO SUSP: 30.0000 mL | ORAL | Status: DC | PRN

## 2016-01-28 MED ORDER — LACTATED RINGERS IV SOLN
INTRAVENOUS | Status: DC
  Administered 2016-01-28 (×2): via INTRAVENOUS

## 2016-01-28 MED ORDER — MORPHINE SULFATE (PF) 2 MG/ML IV SOLN
INTRAVENOUS | Status: AC
  Filled 2016-01-28: qty 1

## 2016-01-28 MED ORDER — SODIUM CHLORIDE 0.9 % IV SOLN
INTRAVENOUS | Status: DC
  Administered 2016-01-28: 19:00:00 via INTRAVENOUS

## 2016-01-28 SURGICAL SUPPLY — 67 items
ALCOHOL ISOPROPYL (RUBBING) (MISCELLANEOUS) ×2 IMPLANT
APL SKNCLS STERI-STRIP NONHPOA (GAUZE/BANDAGES/DRESSINGS) ×1
BAG DECANTER FOR FLEXI CONT (MISCELLANEOUS) ×2 IMPLANT
BANDAGE ACE 6X5 VEL STRL LF (GAUZE/BANDAGES/DRESSINGS) ×4 IMPLANT
BANDAGE ESMARK 6X9 LF (GAUZE/BANDAGES/DRESSINGS) ×1 IMPLANT
BENZOIN TINCTURE PRP APPL 2/3 (GAUZE/BANDAGES/DRESSINGS) ×2 IMPLANT
BLADE SAW SGTL 13.0X1.19X90.0M (BLADE) ×2 IMPLANT
BNDG CMPR 9X6 STRL LF SNTH (GAUZE/BANDAGES/DRESSINGS) ×1
BNDG ESMARK 6X9 LF (GAUZE/BANDAGES/DRESSINGS) ×2
BONE CEMENT PALACOS R-G (Orthopedic Implant) ×4 IMPLANT
BOWL SMART MIX CTS (DISPOSABLE) ×2 IMPLANT
CAP KNEE TOTAL 3 ×1 IMPLANT
CEMENT BONE PALACOS R-G (Orthopedic Implant) ×2 IMPLANT
CLSR STERI-STRIP ANTIMIC 1/2X4 (GAUZE/BANDAGES/DRESSINGS) ×4 IMPLANT
COVER SURGICAL LIGHT HANDLE (MISCELLANEOUS) ×2 IMPLANT
CUFF TOURNIQUET SINGLE 34IN LL (TOURNIQUET CUFF) ×2 IMPLANT
CUFF TOURNIQUET SINGLE 44IN (TOURNIQUET CUFF) IMPLANT
DRAPE EXTREMITY T 121X128X90 (DRAPE) ×2 IMPLANT
DRAPE INCISE IOBAN 66X45 STRL (DRAPES) IMPLANT
DRAPE ORTHO SPLIT 77X108 STRL (DRAPES) ×4
DRAPE PROXIMA HALF (DRAPES) ×2 IMPLANT
DRAPE SURG 17X11 SM STRL (DRAPES) ×4 IMPLANT
DRAPE SURG ORHT 6 SPLT 77X108 (DRAPES) ×2 IMPLANT
DRSG AQUACEL AG ADV 3.5X10 (GAUZE/BANDAGES/DRESSINGS) ×1 IMPLANT
DRSG AQUACEL AG ADV 3.5X14 (GAUZE/BANDAGES/DRESSINGS) ×2 IMPLANT
DURAPREP 26ML APPLICATOR (WOUND CARE) ×6 IMPLANT
ELECT CAUTERY BLADE 6.4 (BLADE) ×2 IMPLANT
ELECT REM PT RETURN 9FT ADLT (ELECTROSURGICAL) ×2
ELECTRODE REM PT RTRN 9FT ADLT (ELECTROSURGICAL) ×1 IMPLANT
GLOVE SKINSENSE NS SZ7.5 (GLOVE) ×1
GLOVE SKINSENSE STRL SZ7.5 (GLOVE) ×1 IMPLANT
GLOVE SURG SYN 7.5  E (GLOVE) ×4
GLOVE SURG SYN 7.5 E (GLOVE) ×4 IMPLANT
GLOVE SURG SYN 7.5 PF PI (GLOVE) ×4 IMPLANT
GOWN STRL REIN XL XLG (GOWN DISPOSABLE) ×2 IMPLANT
GOWN STRL REUS W/ TWL LRG LVL3 (GOWN DISPOSABLE) ×1 IMPLANT
GOWN STRL REUS W/TWL LRG LVL3 (GOWN DISPOSABLE) ×2
HANDPIECE INTERPULSE COAX TIP (DISPOSABLE) ×2
HOOD PEEL AWAY FLYTE STAYCOOL (MISCELLANEOUS) ×4 IMPLANT
KIT BASIN OR (CUSTOM PROCEDURE TRAY) ×2 IMPLANT
KIT ROOM TURNOVER OR (KITS) ×2 IMPLANT
MANIFOLD NEPTUNE II (INSTRUMENTS) ×2 IMPLANT
NDL SPNL 18GX3.5 QUINCKE PK (NEEDLE) ×1 IMPLANT
NEEDLE SPNL 18GX3.5 QUINCKE PK (NEEDLE) ×2 IMPLANT
NS IRRIG 1000ML POUR BTL (IV SOLUTION) ×2 IMPLANT
PACK TOTAL JOINT (CUSTOM PROCEDURE TRAY) ×2 IMPLANT
PAD ARMBOARD 7.5X6 YLW CONV (MISCELLANEOUS) ×4 IMPLANT
PEN SKIN MARKING BROAD (MISCELLANEOUS) ×2 IMPLANT
SAW OSC TIP CART 19.5X105X1.3 (SAW) ×2 IMPLANT
SEALER BIPOLAR AQUA 6.0 (INSTRUMENTS) ×2 IMPLANT
SET HNDPC FAN SPRY TIP SCT (DISPOSABLE) ×1 IMPLANT
STAPLER VISISTAT 35W (STAPLE) IMPLANT
SUCTION FRAZIER HANDLE 10FR (MISCELLANEOUS) ×1
SUCTION TUBE FRAZIER 10FR DISP (MISCELLANEOUS) ×1 IMPLANT
SUT ETHILON 2 0 FS 18 (SUTURE) IMPLANT
SUT MNCRL AB 4-0 PS2 18 (SUTURE) IMPLANT
SUT VIC AB 0 CT1 27 (SUTURE) ×4
SUT VIC AB 0 CT1 27XBRD ANBCTR (SUTURE) ×2 IMPLANT
SUT VIC AB 1 CTX 27 (SUTURE) ×9 IMPLANT
SUT VIC AB 2-0 CT1 27 (SUTURE) ×12
SUT VIC AB 2-0 CT1 TAPERPNT 27 (SUTURE) ×3 IMPLANT
SYR 20CC LL (SYRINGE) ×2 IMPLANT
SYR 50ML LL SCALE MARK (SYRINGE) ×2 IMPLANT
TAPE STRIPS DRAPE STRL (GAUZE/BANDAGES/DRESSINGS) ×1 IMPLANT
TOWEL OR 17X24 6PK STRL BLUE (TOWEL DISPOSABLE) ×2 IMPLANT
TOWEL OR 17X26 10 PK STRL BLUE (TOWEL DISPOSABLE) ×2 IMPLANT
WRAP KNEE MAXI GEL POST OP (GAUZE/BANDAGES/DRESSINGS) ×3 IMPLANT

## 2016-01-28 NOTE — Discharge Instructions (Signed)

## 2016-01-28 NOTE — Anesthesia Postprocedure Evaluation (Signed)
Anesthesia Post Note  Patient: Hannah [name]  Procedure(s) Performed: Procedure(s) (LRB): RIGHT TOTAL KNEE ARTHROPLASTY (Right)  Patient location during evaluation: PACU Anesthesia Type: Spinal Level of consciousness: awake, awake and alert and oriented Pain management: pain level controlled Vital Signs Assessment: post-procedure vital signs reviewed and stable Respiratory status: spontaneous breathing, nonlabored ventilation and respiratory function stable Cardiovascular status: blood pressure returned to baseline Postop Assessment: spinal receding Anesthetic complications: no    Last Vitals:  Vitals:   01/28/16 1530 01/28/16 1545  BP: 98/63   Pulse: 86 83  Resp: 17 17  Temp:      Last Pain:  Vitals:   01/28/16 1046  TempSrc: Oral                 Yanett Conkright COKER

## 2016-01-28 NOTE — Anesthesia Preprocedure Evaluation (Signed)
Anesthesia Evaluation  Patient identified by MRN, date of birth, ID band Patient awake    Reviewed: Allergy & Precautions, NPO status , Patient's Chart, lab work & pertinent test results  Airway Mallampati: II  TM Distance: >3 FB Neck ROM: Full    Dental  (+) Teeth Intact, Dental Advisory Given   Pulmonary  breath sounds clear to auscultation        Cardiovascular hypertension, Rhythm:Regular Rate:Normal     Neuro/Psych    GI/Hepatic   Endo/Other    Renal/GU      Musculoskeletal   Abdominal   Peds  Hematology   Anesthesia Other Findings   Reproductive/Obstetrics                            Anesthesia Physical Anesthesia Plan  ASA: II  Anesthesia Plan: MAC and Spinal   Post-op Pain Management:    Induction: Intravenous  Airway Management Planned: Natural Airway and Simple Face Mask  Additional Equipment:   Intra-op Plan:   Post-operative Plan:   Informed Consent: I have reviewed the patients History and Physical, chart, labs and discussed the procedure including the risks, benefits and alternatives for the proposed anesthesia with the patient or authorized representative who has indicated his/her understanding and acceptance.   Dental advisory given  Plan Discussed with: CRNA and Anesthesiologist  Anesthesia Plan Comments:         Anesthesia Quick Evaluation  

## 2016-01-28 NOTE — Progress Notes (Signed)
Pt is resting quietly; very pleasant; family has been updated & in to visit; waiting for spinal to wear off

## 2016-01-28 NOTE — OR Nursing (Signed)
All OR documentation from 1325-1400 done by [facility]

## 2016-01-28 NOTE — Anesthesia Procedure Notes (Signed)
Spinal  Patient location during procedure: OR Start time: 01/28/2016 12:50 PM End time: 01/28/2016 12:55 PM Staffing Performed: anesthesiologist  Preanesthetic Checklist Completed: patient identified, site marked, surgical consent, pre-op evaluation, timeout performed, IV checked, risks and benefits discussed and monitors and equipment checked Spinal Block Patient position: sitting Prep: Betadine Patient monitoring: cardiac monitor, continuous pulse ox, blood pressure and heart rate Approach: left paramedian Location: L3-4 Injection technique: single-shot Needle Needle type: Tuohy  Needle gauge: 22 G Needle length: 9 cm Needle insertion depth: 8 cm Assessment Sensory level: T6 Additional Notes 12 mg 1% tetracaine injected easily

## 2016-01-28 NOTE — H&P (Signed)
    PREOPERATIVE H&P  Chief Complaint: Right knee degenerative joint disease  HPI: Hannah [name] is a 55 y.o. female who presents for surgical treatment of Right knee degenerative joint disease.  She denies any changes in medical history.  Past Medical History:  Diagnosis Date  . Allergy   . Arthritis   . Asthma   . Hypertension    Past Surgical History:  Procedure Laterality Date  . CHOLECYSTECTOMY  1993  . COLONOSCOPY    . TRIGGER FINGER RELEASE Right 06/18/2014   Procedure: RIGHT LONG FINGER TRIGGER RELEASE;  Surgeon: [name], MD;  Location: [facility];  Service: Orthopedics;  Laterality: Right;  . TUBAL LIGATION     Social History   Social History  . Marital status: Married    Spouse name: N/A  . Number of children: N/A  . Years of education: N/A   Social History Main Topics  . Smoking status: Never Smoker  . Smokeless tobacco: Never Used  . Alcohol use No  . Drug use: No  . Sexual activity: Yes    Partners: Male    Birth control/ protection: Surgical   Other Topics Concern  . None   Social History Narrative  . None   Family History  Problem Relation Age of Onset  . Asthma Mother   . Colon cancer Neg Hx    Allergies  Allergen Reactions  . Sulfur Swelling   Prior to Admission medications   Medication Sig Start Date End Date Taking? Authorizing Provider  B Complex-C (SUPER B COMPLEX PO) Take 1 tablet by mouth daily.    Yes Historical Provider, MD  budesonide-formoterol (SYMBICORT) 160-4.5 MCG/ACT inhaler Inhale 2 puffs into the lungs daily as needed.   Yes Historical Provider, MD  glucosamine-chondroitin 500-400 MG tablet Take 1 tablet by mouth daily.    Yes Historical Provider, MD  hydrochlorothiazide (HYDRODIURIL) 12.5 MG tablet Take 12.5 mg by mouth daily.   Yes Historical Provider, MD  meloxicam (MOBIC) 7.5 MG tablet Take 7.5 mg by mouth 2 (two) times daily.    Yes Historical Provider, MD  Multiple Vitamin (MULTIVITAMIN)  tablet Take 1 tablet by mouth daily.   Yes Historical Provider, MD  Vitamin D, Ergocalciferol, (DRISDOL) 50000 units CAPS capsule Take 50,000 Units by mouth every 7 (seven) days.   Yes Historical Provider, MD     Positive ROS: All other systems have been reviewed and were otherwise negative with the exception of those mentioned in the HPI and as above.  Physical Exam: General: Alert, no acute distress Cardiovascular: No pedal edema Respiratory: No cyanosis, no use of accessory musculature GI: abdomen soft Skin: No lesions in the area of chief complaint Neurologic: Sensation intact distally Psychiatric: Patient is competent for consent with normal mood and affect Lymphatic: no lymphedema  MUSCULOSKELETAL: exam stable  Assessment: Right knee degenerative joint disease  Plan: Plan for Procedure(s): RIGHT TOTAL KNEE ARTHROPLASTY  The risks benefits and alternatives were discussed with the patient including but not limited to the risks of nonoperative treatment, versus surgical intervention including infection, bleeding, nerve injury,  blood clots, cardiopulmonary complications, morbidity, mortality, among others, and they were willing to proceed.   [name], MD   01/28/2016 11:48 AM

## 2016-01-28 NOTE — Transfer of Care (Signed)
Immediate Anesthesia Transfer of Care Note  Patient: Hannah [name]  Procedure(s) Performed: Procedure(s): RIGHT TOTAL KNEE ARTHROPLASTY (Right)  Patient Location: PACU  Anesthesia Type:Spinal  Level of Consciousness: awake, alert , oriented and patient cooperative  Airway & Oxygen Therapy: Patient Spontanous Breathing and Patient connected to nasal cannula oxygen  Post-op Assessment: Report given to RN and Post -op Vital signs reviewed and stable  Post vital signs: Reviewed and stable  Last Vitals:  Vitals:   01/28/16 1046  BP: (!) 151/86  Pulse: 96  Resp: 20  Temp: 36.8 C    Last Pain:  Vitals:   01/28/16 1046  TempSrc: Oral         Complications: No apparent anesthesia complications

## 2016-01-28 NOTE — Op Note (Signed)
Total Knee Arthroplasty Procedure Note Hannah [name] [id number] 01/28/2016   Preoperative diagnosis: Right knee osteoarthritis  Postoperative diagnosis:same  Operative procedure: Right total knee arthroplasty. CPT [phone]  Surgeon: N. Eduard Roux, MD  Assistants: Ky Barban, RNFA  Anesthesia: Spinal  Tourniquet time: less than 2 hrs  Implants used: [employer] BCS Femur: Size 3 PS Tibia: Size 4 Patella: 32 mm, 7.5 thick Polyethylene: 13 mm  Indication: Hannah [name] is a 55 y.o. year old female with a history of knee pain. Having failed conservative management, the patient elected to proceed with a total knee arthroplasty.  We have reviewed the risk and benefits of the surgery and they elected to proceed after voicing understanding.  Procedure:  After informed consent was obtained and understanding of the risk were voiced including but not limited to bleeding, infection, damage to surrounding structures including nerves and vessels, blood clots, leg length inequality and the failure to achieve desired results, the operative extremity was marked with verbal confirmation of the patient in the holding area.   The patient was then brought to the operating room and transported to the operating room table in the supine position.  A tourniquet was applied to the operative extremity around the upper thigh. The operative limb was then prepped and draped in the usual sterile fashion and preoperative antibiotics were administered.  A time out was performed prior to the start of surgery confirming the correct extremity, preoperative antibiotic administration, as well as team members, implants and instruments available for the case. Correct surgical site was also confirmed with preoperative radiographs. The limb was then elevated for exsanguination and the tourniquet was inflated. A midline incision was made and a standard medial parapatellar approach was performed.  The  patella was prepared and sized to a 32 mm.  A cover was placed on the patella for protection from retractors.  We then turned our attention to the femur. Posterior cruciate ligament was sacrificed. Start site was drilled in the femur and the intramedullary distal femoral cutting guide was placed, set at 5 degrees valgus, taking 9 mm of distal resection. The distal cut was made. Osteophytes were then removed. Next, the proximal tibial cutting guide was placed with appropriate slope, varus/valgus alignment and depth of resection. The proximal tibial cut was made. Gap blocks were then used to assess the extension gap and alignment, and appropriate soft tissue releases were performed. Attention was turned back to the femur, which was sized using the sizing guide to a size 3. Appropriate rotation of the femoral component was determined using epicondylar axis, Whiteside's line, and assessing the flexion gap under ligament tension. The appropriate size 4-in-1 cutting block was placed and cuts were made. Posterior femoral osteophytes and uncapped bone were then removed with the curved osteotome. The tibia was sized for a size 4 component. The femoral box-cutting guide was placed and prepared for a PS femoral component. Trial components were placed, and stability was checked in full extension, mid-flexion, and deep flexion. Proper tibial rotation was determined and marked.  The patella tracked well without a lateral release. Trial components were then removed and tibial preparation performed. A posterior capsular injection comprising of 20 cc of 1.3% exparel and 40 cc of normal saline was performed for postoperative pain control. The bony surfaces were irrigated with a pulse lavage and then dried. Bone cement was vacuum mixed on the back table, and the final components sized above were cemented into place. After cement had  finished curing, excess cement was removed. The stability of the construct was re-evaluated  throughout a range of motion and found to be acceptable. The trial liner was removed, the knee was copiously irrigated, and the knee was re-evaluated for any excess bone debris. The real polyethylene liner, 13 mm thick, was inserted and checked to ensure the locking mechanism had engaged appropriately. The tourniquet was deflated and hemostasis was achieved. The wound was irrigated with dilute betadine in normal saline, and then again with normal saline. A drain was not placed. Capsular closure was performed with a #1 vicryl, subcutaneous fat closed with a 0 vicryl suture, then subcutaneous tissue closed with interrupted 2.0 vicryl suture. The skin was then closed with a 3.0 monocryl. A sterile dressing was applied.   The patient was awakened in the operating room and taken to recovery in stable condition. All sponge, needle, and instrument counts were correct at the end of the case.  Position: supine  Complications: none.  Time Out: performed   Drains/Packing: none  Estimated blood loss: minimal  Returned to Recovery Room: in good condition.   Antibiotics: yes   Mechanical VTE (DVT) Prophylaxis: sequential compression devices, TED thigh-high  Chemical VTE (DVT) Prophylaxis: aspirin  Fluid Replacement  Crystalloid: see anesthesia record Blood: none  FFP: none   Specimens Removed: 1 to pathology   Sponge and Instrument Count Correct? yes   PACU: portable radiograph - knee AP and Lateral   Admission: inpatient status, start PT & OT POD#1  Plan/RTC: Return in 2 weeks for wound check.   Weight Bearing/Load Lower Extremity: full   N. Eduard Roux, MD [place] 2:49 PM

## 2016-01-28 NOTE — Plan of Care (Signed)
Problem: Safety: Goal: Ability to remain free from injury will improve Outcome: Progressing Safety precautions maintained  Problem: Tissue Perfusion: Goal: Risk factors for ineffective tissue perfusion will decrease Outcome: Progressing No S/S of DVT  Problem: Nutrition: Goal: Adequate nutrition will be maintained Outcome: Progressing Excellent appetite  Problem: Bowel/Gastric: Goal: Will not experience complications related to bowel motility Outcome: Progressing No bowel issues reported  Problem: Activity: Goal: Ability to avoid complications of mobility impairment will improve Outcome: Progressing OOB to BSC twice Goal: Range of joint motion will improve Outcome: Progressing Limited ROM to RLE Goal: Will remain free from falls Outcome: Progressing No fall noted  Problem: Education: Goal: Knowledge of the prescribed therapeutic regimen will improve Outcome: Progressing SCD and ted hose on  Problem: Physical Regulation: Goal: Ability to maintain clinical measurements within normal limits will improve Outcome: Progressing Moving in the bed with minimal assistance at present time Goal: Postoperative complications will be avoided or minimized Outcome: Progressing No complications noted  Problem: Pain Management: Goal: Pain level will decrease with appropriate interventions Outcome: Progressing Medicated once with PRN pain medication with full relief

## 2016-01-29 ENCOUNTER — Encounter (HOSPITAL_COMMUNITY): Payer: Self-pay | Admitting: Orthopaedic Surgery

## 2016-01-29 LAB — CBC
HCT: 30.2 % — ABNORMAL LOW (ref 36.0–46.0)
Hemoglobin: 9.7 g/dL — ABNORMAL LOW (ref 12.0–15.0)
MCH: 22.5 pg — AB (ref 26.0–34.0)
MCHC: 32.1 g/dL (ref 30.0–36.0)
MCV: 70.1 fL — ABNORMAL LOW (ref 78.0–100.0)
Platelets: 238 10*3/uL (ref 150–400)
RBC: 4.31 MIL/uL (ref 3.87–5.11)
RDW: 16.7 % — AB (ref 11.5–15.5)
WBC: 13.8 10*3/uL — ABNORMAL HIGH (ref 4.0–10.5)

## 2016-01-29 LAB — BASIC METABOLIC PANEL
Anion gap: 10 (ref 5–15)
BUN: 9 mg/dL (ref 6–20)
CALCIUM: 8.4 mg/dL — AB (ref 8.9–10.3)
CO2: 27 mmol/L (ref 22–32)
CREATININE: 0.88 mg/dL (ref 0.44–1.00)
Chloride: 99 mmol/L — ABNORMAL LOW (ref 101–111)
GFR calc non Af Amer: >60 mL/min (ref >60)
Glucose, Bld: 131 mg/dL — ABNORMAL HIGH (ref 65–99)
Potassium: 3.9 mmol/L (ref 3.5–5.1)
SODIUM: 136 mmol/L (ref 135–145)

## 2016-01-29 MED FILL — Sodium Chloride Flush IV Soln 0.9%: INTRAVENOUS | Qty: 40 | Status: AC

## 2016-01-29 NOTE — Progress Notes (Signed)
Physical Therapy Treatment Patient Details Name: Hannah [name] MRN: [id number] DOB: September 15, 1960 Today's Date: 01/29/2016    History of Present Illness 55 y.o. female now s/p Rt TKA. PMH: HTN, asthma    PT Comments    Pt remaining drowsy throughout session with frequent cues needed to attend to task with exercises and during gait. Pt able to ambulate 25 feet but distance limited by instability and fatigue. Pt returned to chair and quickly asleep. PT to continue to follow and progress mobility in anticipation of pt improving as she becomes more alert.    Follow Up Recommendations  Home health PT;Supervision for mobility/OOB     Equipment Recommendations  Rolling walker with 5" wheels    Recommendations for Other Services       Precautions / Restrictions Precautions Precautions: Knee;Fall Precaution Comments: reviewed knee extension precautions Restrictions Weight Bearing Restrictions: Yes RLE Weight Bearing: Weight bearing as tolerated    Mobility  Bed Mobility Overal bed mobility: Needs Assistance Bed Mobility: Supine to Sit     Supine to sit: Supervision;HOB elevated     General bed mobility comments: pt moving slowly but able to perform without physical assist.   Transfers Overall transfer level: Needs assistance Equipment used: Rolling walker (2 wheeled) Transfers: Sit to/from Stand Sit to Stand: Min guard         General transfer comment: cues for hand position  Ambulation/Gait Ambulation/Gait assistance: Min guard Ambulation Distance (Feet): 25 Feet Assistive device: Rolling walker (2 wheeled) Gait Pattern/deviations: Step-to pattern;Decreased weight shift to right Gait velocity: slow pattern   General Gait Details: Pt becoming unsteady during ambulation, requiring return to sitting for safety. Very drowsy throughout session.    Stairs            Wheelchair Mobility    Modified Rankin (Stroke Patients Only)       Balance Overall balance  assessment: Needs assistance Sitting-balance support: No upper extremity supported Sitting balance-Leahy Scale: Fair     Standing balance support: Bilateral upper extremity supported Standing balance-Leahy Scale: Poor Standing balance comment: using rw                    Cognition Arousal/Alertness: Lethargic;Suspect due to medications Behavior During Therapy: [facility] for tasks assessed/performed Overall Cognitive Status: Within Functional Limits for tasks assessed                      Exercises Total Joint Exercises Quad Sets: Strengthening;Right;10 reps Heel Slides: AAROM;Right;10 reps Straight Leg Raises: Strengthening;Right;10 reps Goniometric ROM: 75 degrees knee flexion    General Comments General comments (skin integrity, edema, etc.): Pt remaining very drowsy throughout session, frequent cues to stay awake and attend to task.        Pertinent Vitals/Pain Pain Assessment: 0-10 Pain Score: 7  Pain Location: Rt knee Pain Descriptors / Indicators: Sore Pain Intervention(s): Limited activity within patient's tolerance;Monitored during session    Home Living                      Prior Function            PT Goals (current goals can now be found in the care plan section) Acute Rehab PT Goals Patient Stated Goal: not expressed PT Goal Formulation: With patient Time For Goal Achievement: 02/12/16 Potential to Achieve Goals: Good Progress towards PT goals: Progressing toward goals    Frequency  7X/week    PT Plan Current plan remains appropriate  Co-evaluation             End of Session Equipment Utilized During Treatment: Gait belt Activity Tolerance: Patient limited by lethargy Patient left: in chair;with call bell/phone within reach (in knee extension)     Time: [id number] PT Time Calculation (min) (ACUTE ONLY): 25 min  Charges:  $Gait Training: 8-22 mins $Therapeutic Exercise: 8-22 mins                    G Codes:       Cassell Clement, PT, CSCS Pager [phone] Office [phone]  01/29/2016, 3:04 PM

## 2016-01-29 NOTE — Evaluation (Signed)
Physical Therapy Evaluation Patient Details Name: Hannah [name] MRN: [id number] DOB: Oct 05, 1960 Today's Date: 01/29/2016   History of Present Illness  55 y.o. female now s/p Rt TKA. PMH: HTN, asthma  Clinical Impression  Activity limited during initial PT session due to lethargy. Repeated cues needed for pt to attend to task and stay awake during activity. Ambulation limited by instability while standing and need to sit due to safety concerns. Anticipate pt will progress well when she is more alert. PT to continue to follow.     Follow Up Recommendations Home health PT;Supervision for mobility/OOB    Equipment Recommendations  Rolling walker with 5" wheels    Recommendations for Other Services       Precautions / Restrictions Precautions Precautions: Knee;Fall Precaution Booklet Issued: Yes (comment) Precaution Comments: HEP provided, reviewed knee extension precautions.  Restrictions Weight Bearing Restrictions: Yes RLE Weight Bearing: Weight bearing as tolerated      Mobility  Bed Mobility               General bed mobility comments: pt in chair upon arrival  Transfers Overall transfer level: Needs assistance Equipment used: Rolling walker (2 wheeled) Transfers: Sit to/from Stand Sit to Stand: Min guard         General transfer comment: cues for hand position  Ambulation/Gait Ambulation/Gait assistance: Min guard Ambulation Distance (Feet): 15 Feet Assistive device: Rolling walker (2 wheeled) Gait Pattern/deviations: Step-through pattern Gait velocity: slow pattern   General Gait Details: Cues to attend to task as pt very drowsy throughout session. Pt becoming unsteady while standing and required sitting for safety.   Stairs            Wheelchair Mobility    Modified Rankin (Stroke Patients Only)       Balance Overall balance assessment: Needs assistance Sitting-balance support: No upper extremity supported Sitting balance-Leahy Scale:  Fair     Standing balance support: Bilateral upper extremity supported Standing balance-Leahy Scale: Poor Standing balance comment: using rw                             Pertinent Vitals/Pain Pain Assessment: 0-10 Pain Score: 10-Worst pain ever Pain Location: Rt knee Pain Descriptors / Indicators:  (hurts) Pain Intervention(s): Monitored during session;Limited activity within patient's tolerance;Ice applied    Home Living Family/patient expects to be discharged to:: Private residence Living Arrangements: Spouse/significant other;Children Available Help at Discharge: Family;Available 24 hours/day Type of Home: House Home Access: Stairs to enter Entrance Stairs-Rails: None Entrance Stairs-Number of Steps: 1 Home Layout: Two level Home Equipment: Crutches      Prior Function Level of Independence: Independent               Hand Dominance        Extremity/Trunk Assessment   Upper Extremity Assessment: Overall WFL for tasks assessed           Lower Extremity Assessment: RLE deficits/detail RLE Deficits / Details: assist needed to raise Rt LE       Communication   Communication: No difficulties  Cognition Arousal/Alertness: Lethargic;Suspect due to medications Behavior During Therapy: [facility] for tasks assessed/performed Overall Cognitive Status: Within Functional Limits for tasks assessed                      General Comments General comments (skin integrity, edema, etc.): Pt very drowsy throughout session, limiting activity and education.     Exercises  Assessment/Plan    PT Assessment Patient needs continued PT services  PT Diagnosis Difficulty walking   PT Problem List Decreased strength;Decreased range of motion;Decreased activity tolerance;Decreased balance;Decreased mobility  PT Treatment Interventions DME instruction;Gait training;Stair training;Functional mobility training;Therapeutic activities;Therapeutic  exercise;Patient/family education   PT Goals (Current goals can be found in the Care Plan section) Acute Rehab PT Goals Patient Stated Goal: not expressed PT Goal Formulation: With patient Time For Goal Achievement: 02/12/16 Potential to Achieve Goals: Good    Frequency 7X/week   Barriers to discharge        Co-evaluation               End of Session Equipment Utilized During Treatment: Gait belt Activity Tolerance: Patient limited by lethargy Patient left: in chair;with call bell/phone within reach (in knee extension) Nurse Communication: Mobility status         Time: [id number] PT Time Calculation (min) (ACUTE ONLY): 18 min   Charges:   PT Evaluation $PT Eval Moderate Complexity: 1 Procedure     PT G Codes:        Cassell Clement, PT, CSCS Pager [phone] Office [phone]  01/29/2016, 9:42 AM

## 2016-01-29 NOTE — Care Management Note (Signed)
Case Management Note  Patient Details  Name: Hannah [name] MRN: [id number] Date of Birth: 08/02/1960  Subjective/Objective:  55 yr old female s/p right total knee arthroplasty.                  Action/Plan: Case manager spoke with patient at bedside. Patient quite sleepy at this time. Discussed Home Health and DME needs. Patient was preoperatively setup with [facility], no changes. Patient  Will have family support at discharge.   Expected Discharge Date:  01/30/16               Expected Discharge Plan:  [place]  In-House Referral:     Discharge planning Services  CM Consult  Post Acute Care Choice:  Durable Medical Equipment, Home Health Choice offered to:  Patient  DME Arranged:  3-N-1, Walker rolling DME Agency:  [place]:  PT [place]:  [facility] (now [facility])  Status of Service:  Completed, signed off  If discussed at [employer] of Stay Meetings, dates discussed:    Additional Comments:  Ninfa Meeker, RN 01/29/2016, 11:07 AM

## 2016-01-29 NOTE — Evaluation (Signed)
Occupational Therapy Evaluation Patient Details Name: Hannah [name] MRN: [id number] DOB: 1961/05/01 Today's Date: 01/29/2016    History of Present Illness 55 y.o. female now s/p Rt TKA. PMH: HTN, asthma   Clinical Impression   Pt was independent prior to admission. Limited ability to educate pt today in ADL due to lethargy, likely due to medication. Will follow acutely.    Follow Up Recommendations  No OT follow up    Equipment Recommendations  None recommended by OT    Recommendations for Other Services       Precautions / Restrictions Precautions Precautions: Knee;Fall Precaution Comments: reviewed knee extension precautions Restrictions Weight Bearing Restrictions: Yes RLE Weight Bearing: Weight bearing as tolerated      Mobility Bed Mobility Overal bed mobility: Needs Assistance Bed Mobility: Sit to Supine      Sit to supine: Min assist   General bed mobility comments: assist for R LE  Transfers Overall transfer level: Needs assistance Equipment used: Rolling walker (2 wheeled) Transfers: Sit to/from Stand Sit to Stand: Min guard         General transfer comment: cues for hand position    Balance Overall balance assessment: Needs assistance Sitting-balance support: No upper extremity supported Sitting balance-Leahy Scale: Fair     Standing balance support: Bilateral upper extremity supported Standing balance-Leahy Scale: Poor Standing balance comment: reliant on B UEs                            ADL Overall ADL's : Needs assistance/impaired Eating/Feeding: Independent;Sitting   Grooming: Wash/dry hands;Sitting;Set up   Upper Body Bathing: Set up;Sitting   Lower Body Bathing: Sit to/from stand;Maximal assistance   Upper Body Dressing : Set up;Sitting   Lower Body Dressing: Maximal assistance;Sit to/from stand   Toilet Transfer: Min guard;Stand-pivot;BSC;RW   Toileting- Water quality scientist and Hygiene: Minimal  assistance;Sit to/from stand         General ADL Comments: Did not educate in AE or multiple uses of 3 in 1 due to lethargy, pt unlikely to recall.     Vision     Perception     Praxis      Pertinent Vitals/Pain Pain Assessment: Faces Pain Score: 7  Faces Pain Scale: Hurts even more Pain Location: R knee, IV site Pain Descriptors / Indicators: Sore;Grimacing Pain Intervention(s): Monitored during session;Premedicated before session;Repositioned;Ice applied     Hand Dominance Right   Extremity/Trunk Assessment Upper Extremity Assessment Upper Extremity Assessment: Overall WFL for tasks assessed   Lower Extremity Assessment Lower Extremity Assessment: Defer to PT evaluation       Communication Communication Communication: No difficulties   Cognition Arousal/Alertness: Lethargic Behavior During Therapy: WFL for tasks assessed/performed Overall Cognitive Status: Within Functional Limits for tasks assessed                     General Comments       Exercises       Shoulder Instructions      Home Living Family/patient expects to be discharged to:: Private residence Living Arrangements: Spouse/significant other;Children Available Help at Discharge: Family;Available 24 hours/day Type of Home: House Home Access: Stairs to enter [employer] of Steps: 1 Entrance Stairs-Rails: None Home Layout: Two level Alternate Level Stairs-Number of Steps: flight Alternate Level Stairs-Rails: Right Bathroom Shower/Tub: Teacher, early years/pre: Standard     Home Equipment: Crutches   Additional Comments: 3 in 1 and RW already delivered  to room      Prior Functioning/Environment Level of Independence: Independent             OT Diagnosis: Generalized weakness;Acute pain   OT Problem List: Decreased strength;Decreased activity tolerance;Impaired balance (sitting and/or standing);Decreased knowledge of use of DME or AE;Obesity;Pain    OT Treatment/Interventions: Self-care/ADL training;DME and/or AE instruction;Patient/family education;Balance training    OT Goals(Current goals can be found in the care plan section) Acute Rehab OT Goals Patient Stated Goal: to have IV site pain addressed OT Goal Formulation: With patient Time For Goal Achievement: 02/05/16 Potential to Achieve Goals: Good ADL Goals Pt Will Perform Grooming: with supervision;standing Pt Will Transfer to Toilet: with supervision;ambulating;bedside commode (over toilet) Pt Will Perform Toileting - Clothing Manipulation and hygiene: with supervision;sit to/from stand Additional ADL Goal #1: Pt will be aware of availability and use of AE for LB ADL.  OT Frequency: Min 2X/week   Barriers to D/C:            Co-evaluation              End of Session Equipment Utilized During Treatment: Gait belt;Rolling walker  Activity Tolerance: Patient limited by lethargy Patient left: in bed;with call bell/phone within reach;with nursing/sitter in room   Time: 1408-1430 OT Time Calculation (min): 22 min Charges:  OT General Charges $OT Visit: 1 Procedure OT Evaluation $OT Eval Moderate Complexity: 1 Procedure G-Codes:    Malka So 01/29/2016, 4:51 PM  [phone]

## 2016-01-30 ENCOUNTER — Encounter (HOSPITAL_COMMUNITY): Payer: Self-pay

## 2016-01-30 NOTE — Discharge Summary (Signed)
Patient ID: GIA SEES MRN: [id number] DOB/AGE: Jul 25, 1960 55 y.o.  Admit date: 01/28/2016 Discharge date: 01/30/2016  Admission Diagnoses:  Active Problems:   Total knee replacement status   Discharge Diagnoses:  Same  Past Medical History:  Diagnosis Date  . Allergy   . Arthritis   . Asthma   . Hypertension     Surgeries: Procedure(s): RIGHT TOTAL KNEE ARTHROPLASTY on 01/28/2016   Consultants:   Discharged Condition: Improved  Hospital Course: Hannah [name] is an 55 y.o. female who was admitted 01/28/2016 for operative treatment of<principal problem not specified>. Patient has severe unremitting pain that affects sleep, daily activities, and work/hobbies. After pre-op clearance the patient was taken to the operating room on 01/28/2016 and underwent  Procedure(s): RIGHT TOTAL KNEE ARTHROPLASTY.    Patient was given perioperative antibiotics: Anti-infectives    Start     Dose/Rate Route Frequency Ordered Stop   01/28/16 2000  ceFAZolin (ANCEF) IVPB 2g/100 mL premix     2 g 200 mL/hr over 30 Minutes Intravenous Every 6 hours 01/28/16 1550 01/29/16 0300   01/28/16 1200  ceFAZolin (ANCEF) IVPB 2g/100 mL premix     2 g 200 mL/hr over 30 Minutes Intravenous On call to O.R. 01/28/16 1032 01/28/16 1243       Patient was given sequential compression devices, early ambulation, and chemoprophylaxis to prevent DVT.  Patient benefited maximally from hospital stay and there were no complications.    Recent vital signs: Patient Vitals for the past 24 hrs:  BP Temp Temp src Pulse Resp SpO2  01/30/16 0611 (!) 159/90 98.1 F (36.7 C) Oral 97 16 93 %  01/29/16 2300 (!) 143/80 - - - - -  01/29/16 2108 (!) 171/98 98.4 F (36.9 C) Oral 91 17 95 %  01/29/16 1300 (!) 172/98 98.8 F (37.1 C) Oral (!) 104 18 93 %     Recent laboratory studies:  Recent Labs  01/29/16 0515  WBC 13.8*  HGB 9.7*  HCT 30.2*  PLT 238  NA 136  K 3.9  CL 99*  CO2 27  BUN 9  CREATININE 0.88   GLUCOSE 131*  CALCIUM 8.4*     Discharge Medications:     Medication List    TAKE these medications   aspirin EC 325 MG tablet Take 1 tablet (325 mg total) by mouth 2 (two) times daily.   budesonide-formoterol 160-4.5 MCG/ACT inhaler Commonly known as:  SYMBICORT Inhale 2 puffs into the lungs daily as needed.   glucosamine-chondroitin 500-400 MG tablet Take 1 tablet by mouth daily.   hydrochlorothiazide 12.5 MG tablet Commonly known as:  HYDRODIURIL Take 12.5 mg by mouth daily.   meloxicam 7.5 MG tablet Commonly known as:  MOBIC Take 7.5 mg by mouth 2 (two) times daily.   methocarbamol 750 MG tablet Commonly known as:  ROBAXIN Take 1 tablet (750 mg total) by mouth 2 (two) times daily as needed for muscle spasms.   multivitamin tablet Take 1 tablet by mouth daily.   ondansetron 4 MG tablet Commonly known as:  ZOFRAN Take 1-2 tablets (4-8 mg total) by mouth every 8 (eight) hours as needed for nausea or vomiting.   oxyCODONE 10 mg 12 hr tablet Commonly known as:  OXYCONTIN Take 1 tablet (10 mg total) by mouth every 12 (twelve) hours.   oxyCODONE 5 MG immediate release tablet Commonly known as:  Oxy IR/ROXICODONE Take 1-3 tablets (5-15 mg total) by mouth every 4 (four) hours as needed.   senna-docusate  8.6-50 MG tablet Commonly known as:  SENOKOT S Take 1 tablet by mouth at bedtime as needed.   SUPER B COMPLEX PO Take 1 tablet by mouth daily.   Vitamin D (Ergocalciferol) 50000 units Caps capsule Commonly known as:  DRISDOL Take 50,000 Units by mouth every 7 (seven) days.       Diagnostic Studies: Dg Knee Right Port  Result Date: 01/28/2016 CLINICAL DATA:  Status post right total knee arthroplasty. EXAM: PORTABLE RIGHT KNEE - 1-2 VIEW COMPARISON:  Radiographs of August 19, 2013. FINDINGS: The femoral and tibial components appear to be well situated. No fracture or dislocation is noted. Expected postoperative findings are seen in the anterior soft tissues.  IMPRESSION: Status post right total knee arthroplasty. Electronically Signed   By: Marijo [facility], M.D.   On: 01/28/2016 16:12    Disposition: 01-Home or Self Care  Discharge Instructions    Call MD / Call 911    Complete by:  As directed   If you experience chest pain or shortness of breath, CALL 911 and be transported to the hospital emergency room.  If you develope a fever above 101 F, pus (white drainage) or increased drainage or redness at the wound, or calf pain, call your surgeon's office.   Constipation Prevention    Complete by:  As directed   Drink plenty of fluids.  Prune juice may be helpful.  You may use a stool softener, such as Colace (over the counter) 100 mg twice a day.  Use MiraLax (over the counter) for constipation as needed.   Diet - low sodium heart healthy    Complete by:  As directed   Discharge patient    Complete by:  As directed   Increase activity slowly as tolerated    Complete by:  As directed      Follow-up Information    [name], MD Follow up in 2 week(s).   Specialty:  Orthopedic Surgery Why:  For suture removal, For wound re-check Contact information: [place] [address] [phone]        [facility] .   Why:  Someone from [facility] ([facility] will contact you to arrange start date and time for therapy Contact information: [place] [address] [phone]            Signed: [name] 01/30/2016, 8:05 AM

## 2016-01-30 NOTE — Progress Notes (Signed)
Subjective: 2 Days Post-Op Procedure(s) (LRB): RIGHT TOTAL KNEE ARTHROPLASTY (Right) Patient reports pain as moderate.    Objective: Vital signs in last 24 hours: Temp:  [98.1 F (36.7 C)-98.8 F (37.1 C)] 98.1 F (36.7 C) (09/02 0611) Pulse Rate:  [91-104] 97 (09/02 0611) Resp:  [16-18] 16 (09/02 0611) BP: (143-172)/(80-98) 159/90 (09/02 0611) SpO2:  [93 %-95 %] 93 % (09/02 0611)  Intake/Output from previous day: 09/01 0701 - 09/02 0700 In: 480 [P.O.:480] Out: 4 [Urine:4] Intake/Output this shift: No intake/output data recorded.   Recent Labs  01/29/16 0515  HGB 9.7*    Recent Labs  01/29/16 0515  WBC 13.8*  RBC 4.31  HCT 30.2*  PLT 238    Recent Labs  01/29/16 0515  NA 136  K 3.9  CL 99*  CO2 27  BUN 9  CREATININE 0.88  GLUCOSE 131*  CALCIUM 8.4*   No results for input(s): LABPT, INR in the last 72 hours.  Sensation intact distally Intact pulses distally Dorsiflexion/Plantar flexion intact Incision: scant drainage Compartment soft  Assessment/Plan: 2 Days Post-Op Procedure(s) (LRB): RIGHT TOTAL KNEE ARTHROPLASTY (Right) Up with therapy Discharge home with home health today. Needs to work on stairs prior to discharge.  Hannah [name] 01/30/2016, 8:04 AM

## 2016-01-30 NOTE — Progress Notes (Signed)
Occupational Therapy Treatment/Discharge Patient Details Name: Hannah [name] MRN: [id number] DOB: 16-Dec-1960 Today's Date: 01/30/2016    History of present illness 55 y.o. female now s/p Rt TKA. PMH: HTN, asthma   OT comments  Pt progressing well and has achieved all acute OT goals. Practiced tub transfer using 3in1 as shower seat and advised pt's daughter or husband to be present and assist when pt is bathing at home. Educated pt on fall prevention and home safety strategies. All education has been completed and pt has no further questions. Pt with no further acute OT needs. OT signing off.   Follow Up Recommendations  No OT follow up;Supervision - Intermittent    Equipment Recommendations  None recommended by OT    Recommendations for Other Services      Precautions / Restrictions Precautions Precautions: Knee;Fall Precaution Comments: reviewed knee extension precautions Restrictions Weight Bearing Restrictions: Yes RLE Weight Bearing: Weight bearing as tolerated       Mobility Bed Mobility Overal bed mobility: Needs Assistance Bed Mobility: Supine to Sit     Supine to sit: Min assist     General bed mobility comments: Assist to support RLE coming off bed.   Transfers Overall transfer level: Needs assistance Equipment used: Rolling walker (2 wheeled) Transfers: Sit to/from [facility] Sit to Stand: Min guard Stand pivot transfers: Min guard       General transfer comment: Min guard assist for balance and safety. VCs for safe hand placement    Balance Overall balance assessment: Needs assistance Sitting-balance support: No upper extremity supported;Feet supported Sitting balance-Leahy Scale: Good     Standing balance support: Bilateral upper extremity supported;During functional activity Standing balance-Leahy Scale: Poor                     ADL Overall ADL's : Needs assistance/impaired     Grooming: Wash/dry hands;Wash/dry  face;Min guard;Standing   Upper Body Bathing: Supervision/ safety;Sitting   Lower Body Bathing: Minimal assistance;Sit to/from stand Lower Body Bathing Details (indicate cue type and reason): daughter or husband can assist Upper Body Dressing : Supervision/safety;Sitting   Lower Body Dressing: Minimal assistance;Sit to/from stand Lower Body Dressing Details (indicate cue type and reason): daughter or husband can assist Toilet Transfer: Min guard;Ambulation;BSC;RW   Toileting- Water quality scientist and Hygiene: Min guard;Sit to/from stand   Tub/ Shower Transfer: Tub transfer;Minimal assistance;Ambulation;3 in 1;Rolling walker Tub/Shower Transfer Details (indicate cue type and reason): Multiple attempts to complete transfer with 3in1, but pt unable to bend R knee enough to clear lip of tub. Ultimately decided stepping L foot in tub first and sitting down on 3in1 with daughter assisting to bring RLE into tub was best option. Advised pt's daughter to be present and assist with these transfers. Functional mobility during ADLs: Minimal assistance;Rolling walker        Vision                     Perception     Praxis      Cognition   Behavior During Therapy: WFL for tasks assessed/performed Overall Cognitive Status: Within Functional Limits for tasks assessed                       Extremity/Trunk Assessment               Exercises     Shoulder Instructions       General Comments      Pertinent Vitals/  Pain       Pain Assessment: 0-10 Pain Score: 8  Pain Location: R knee Pain Descriptors / Indicators: Aching Pain Intervention(s): Limited activity within patient's tolerance;Monitored during session;Repositioned;Patient requesting pain meds-RN notified  Home Living                                          Prior Functioning/Environment              Frequency       Progress Toward Goals  OT Goals(current goals can now be  found in the care plan section)  Progress towards OT goals: Goals met/education completed, patient discharged from OT  Acute Rehab OT Goals Patient Stated Goal: to go home OT Goal Formulation: With patient Time For Goal Achievement: 02/05/16 Potential to Achieve Goals: Good ADL Goals Pt Will Perform Grooming: with supervision;standing Pt Will Transfer to Toilet: with supervision;ambulating;bedside commode Pt Will Perform Toileting - Clothing Manipulation and hygiene: with supervision;sit to/from stand Additional ADL Goal #1: Pt will be aware of availability and use of AE for LB ADL.  Plan All goals met and education completed, patient discharged from OT services    Co-evaluation                 End of Session Equipment Utilized During Treatment: Gait belt;Rolling walker   Activity Tolerance Patient tolerated treatment well   Patient Left in bed;with call bell/phone within reach;with family/visitor present   Nurse Communication Mobility status;Patient requests pain meds        Time: 4259-5638 OT Time Calculation (min): 24 min  Charges: OT Treatments $Self Care/Home Management : 23-37 mins  Redmond Baseman, OTR/L Pager: [phone] 01/30/2016, 9:42 AM

## 2016-01-30 NOTE — Progress Notes (Signed)
Physical Therapy Treatment Patient Details Name: Hannah [name] MRN: [id number] DOB: Oct 02, 1960 Today's Date: 01/30/2016    History of Present Illness 55 y.o. female now s/p Rt TKA. PMH: HTN, asthma    PT Comments    Patient is making good progress with PT.  From a mobility standpoint anticipate patient will be ready for DC home with family support. Pt denies any questions or concerns following session and reports feeling confident with her mobility.     Follow Up Recommendations  Home health PT;Supervision for mobility/OOB     Equipment Recommendations  Rolling walker with 5" wheels    Recommendations for Other Services       Precautions / Restrictions Precautions Precautions: Knee;Fall Restrictions Weight Bearing Restrictions: Yes RLE Weight Bearing: Weight bearing as tolerated    Mobility  Bed Mobility Overal bed mobility: Needs Assistance Bed Mobility: Supine to Sit     Supine to sit: Supervision     General bed mobility comments: bed flat, no rail  Transfers Overall transfer level: Needs assistance Equipment used: Rolling walker (2 wheeled) Transfers: Sit to/from Stand Sit to Stand: Min guard         General transfer comment: min guard for safety.  Ambulation/Gait Ambulation/Gait assistance: Min guard Ambulation Distance (Feet): 100 Feet (plus 75) Assistive device: Rolling walker (2 wheeled) Gait Pattern/deviations: Step-to pattern;Step-through pattern;Decreased weight shift to right Gait velocity: decreased   General Gait Details: cues for even strides and increased weightbearing through Rt LE   Stairs Stairs: Yes Stairs assistance: Min assist Stair Management: One rail Right;Step to pattern;Sideways Number of Stairs: 2 General stair comments: At top of stairs pt reports feeling lightheaded, assisted back down stairs. Pt having difficulty with confidence through [place] for stairs. Able to complete 2 steps but not safe to attempt full flight. Pt  reports that she can stay on the main floor of her home. Also reports that she can use a side entrance without any stairs. Pt refused to attempt single step with backward approach but educated on technique. Pt denies and concerns about getting into her home following PT session.   Wheelchair Mobility    Modified Rankin (Stroke Patients Only)       Balance Overall balance assessment: Needs assistance Sitting-balance support: No upper extremity supported Sitting balance-Leahy Scale: Good     Standing balance support: Bilateral upper extremity supported Standing balance-Leahy Scale: Poor Standing balance comment: using rw                    Cognition Arousal/Alertness: Awake/alert Behavior During Therapy: WFL for tasks assessed/performed Overall Cognitive Status: Within Functional Limits for tasks assessed                      Exercises Total Joint Exercises Ankle Circles/Pumps: AROM;Both;10 reps Quad Sets: Strengthening;Right;10 reps Heel Slides: AAROM;Right;10 reps Hip ABduction/ADduction: Strengthening;Right;10 reps Straight Leg Raises: Strengthening;Right;10 reps Goniometric ROM: 77 degrees flexion    General Comments        Pertinent Vitals/Pain Pain Assessment: 0-10 Pain Score: 7  Pain Location: Rt knee Pain Descriptors / Indicators: Aching Pain Intervention(s): Limited activity within patient's tolerance;Monitored during session    Home Living                      Prior Function            PT Goals (current goals can now be found in the care plan section) Acute Rehab PT  Goals Patient Stated Goal: to go home PT Goal Formulation: With patient Time For Goal Achievement: 02/12/16 Potential to Achieve Goals: Good Progress towards PT goals: Progressing toward goals    Frequency  7X/week    PT Plan Current plan remains appropriate    Co-evaluation             End of Session Equipment Utilized During Treatment: Gait  belt Activity Tolerance: Patient tolerated treatment well Patient left: in chair;with call bell/phone within reach;with family/visitor present (in knee extension)     Time: [id number] PT Time Calculation (min) (ACUTE ONLY): 32 min  Charges:  $Gait Training: 8-22 mins $Therapeutic Exercise: 8-22 mins                    G Codes:      Cassell Clement, PT, CSCS Pager [phone] Office [phone]  01/30/2016, 1:15 PM

## 2016-03-10 ENCOUNTER — Ambulatory Visit (INDEPENDENT_AMBULATORY_CARE_PROVIDER_SITE_OTHER): Payer: 59 | Admitting: Orthopaedic Surgery

## 2016-03-10 DIAGNOSIS — M1711 Unilateral primary osteoarthritis, right knee: Secondary | ICD-10-CM

## 2016-03-29 ENCOUNTER — Telehealth (INDEPENDENT_AMBULATORY_CARE_PROVIDER_SITE_OTHER): Payer: Self-pay | Admitting: *Deleted

## 2016-03-29 NOTE — Telephone Encounter (Signed)
PT. Called with questions about her return to work. PT requesting call back.

## 2016-03-30 NOTE — Telephone Encounter (Signed)
Called pt tp asdvise peer to peer was never done, we tried to do the peer to peer yesterday but the case was closed. Called case manager and did not answer left message for [place] to call us back to see what the next step will be so dis will not be denied

## 2016-04-01 ENCOUNTER — Telehealth (INDEPENDENT_AMBULATORY_CARE_PROVIDER_SITE_OTHER): Payer: Self-pay

## 2016-04-01 ENCOUNTER — Encounter (INDEPENDENT_AMBULATORY_CARE_PROVIDER_SITE_OTHER): Payer: Self-pay

## 2016-04-01 NOTE — Telephone Encounter (Signed)
Hannah [name]- Case [phone]

## 2016-04-04 ENCOUNTER — Encounter (INDEPENDENT_AMBULATORY_CARE_PROVIDER_SITE_OTHER): Payer: Self-pay | Admitting: Orthopaedic Surgery

## 2016-04-05 ENCOUNTER — Ambulatory Visit (INDEPENDENT_AMBULATORY_CARE_PROVIDER_SITE_OTHER): Payer: 59 | Admitting: Orthopaedic Surgery

## 2016-04-05 ENCOUNTER — Ambulatory Visit (INDEPENDENT_AMBULATORY_CARE_PROVIDER_SITE_OTHER): Payer: Self-pay

## 2016-04-05 ENCOUNTER — Encounter (INDEPENDENT_AMBULATORY_CARE_PROVIDER_SITE_OTHER): Payer: Self-pay | Admitting: Orthopaedic Surgery

## 2016-04-05 DIAGNOSIS — M1711 Unilateral primary osteoarthritis, right knee: Secondary | ICD-10-CM

## 2016-04-05 MED ORDER — DICLOFENAC SODIUM 75 MG PO TBEC: 75.0000 mg | DELAYED_RELEASE_TABLET | Freq: Two times a day (BID) | ORAL | 2 refills | Status: DC

## 2016-04-05 MED ORDER — HYDROCODONE-ACETAMINOPHEN 5-325 MG PO TABS: 1.0000 | ORAL_TABLET | Freq: Every evening | ORAL | 0 refills | Status: DC | PRN

## 2016-04-05 NOTE — Addendum Note (Signed)
Addended by: Precious Bard on: 04/05/2016 11:29 AM   Modules accepted: Orders

## 2016-04-05 NOTE — Progress Notes (Signed)
Office Visit Note   Patient: Hannah [name]           Date of Birth: [birth year]           MRN: [id number] Visit Date: 04/05/2016              Requested by: Nolene Ebbs, MD [address] [place], [place] [address] PCP: Philis Fendt, MD   Assessment & Plan: Visit Diagnoses:  1. Primary osteoarthritis of right knee     Plan:  - findings c/w inflammation and flare up - recommend rest, OOW this week, return to PT next week - diclofenac, ice, norco for night time - f/u as scheduled  Follow-Up Instructions: Return for as scheduled.   Orders:  Orders Placed This Encounter  Procedures  . XR Knee 1-2 Views Right   Meds ordered this encounter  Medications  . diclofenac (VOLTAREN) 75 MG EC tablet    Sig: Take 1 tablet (75 mg total) by mouth 2 (two) times daily.    Dispense:  30 tablet    Refill:  2  . HYDROcodone-acetaminophen (NORCO) 5-325 MG tablet    Sig: Take 1 tablet by mouth at bedtime as needed.    Dispense:  20 tablet    Refill:  0      Procedures: No procedures performed   Clinical Data: No additional findings.   Subjective: Chief Complaint  Patient presents with  . Right Knee - Pain, Edema    Hannah [name] comes in today for acute swelling and pain of right knee.  She's having 5-6/10 pain and has not been able to do PT.  She's back to work part time.  Denies f/c/n/v.    Review of Systems   Objective: Vital Signs: There were no vitals taken for this visit.  Physical Exam  Musculoskeletal:       Right knee: She exhibits no effusion.    Right Knee Exam   Range of Motion  The patient has normal right knee ROM.  Tests  Varus: negative Valgus: negative  Other  Sensation: normal Pulse: present Swelling: moderate Other tests: no effusion present  Comments:  No signs of infection.  Minimally warmer than left knee      Specialty Comments:  No specialty comments available.  Imaging: No results found.   PMFS History: Patient  Active Problem List   Diagnosis Date Noted  . 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 Allergy   . Arthritis   . Asthma   . Hypertension     Family History  Problem Relation Age of Onset  . Asthma Mother   . Colon cancer Neg Hx     Past Surgical History:  Procedure Laterality Date  . CHOLECYSTECTOMY  1993  . COLONOSCOPY    . 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 TOTAL KNEE ARTHROPLASTY Right 01/28/2016   Procedure: RIGHT TOTAL KNEE ARTHROPLASTY;  Surgeon: [name], MD;  Location: [facility];  Service: Orthopedics;  Laterality: Right;  . TRIGGER FINGER RELEASE Right 06/18/2014   Procedure: RIGHT LONG FINGER TRIGGER RELEASE;  Surgeon: [name], MD;  Location: [facility];  Service: Orthopedics;  Laterality: Right;  . TUBAL LIGATION     Social History   Occupational History  . Not on file.   Social History Main Topics  . Smoking status: Never Smoker  . Smokeless tobacco: Never Used  . Alcohol use No  . Drug use:  No  . Sexual activity: Yes    Partners: Male    Birth control/ protection: Surgical

## 2016-04-25 ENCOUNTER — Ambulatory Visit (INDEPENDENT_AMBULATORY_CARE_PROVIDER_SITE_OTHER): Payer: 59 | Admitting: Orthopaedic Surgery

## 2016-04-25 ENCOUNTER — Encounter (INDEPENDENT_AMBULATORY_CARE_PROVIDER_SITE_OTHER): Payer: Self-pay | Admitting: Orthopaedic Surgery

## 2016-04-25 DIAGNOSIS — Z96651 Presence of right artificial knee joint: Secondary | ICD-10-CM

## 2016-04-25 NOTE — Progress Notes (Signed)
   Office Visit Note   Patient: Hannah [name]           Date of Birth: 1960-06-04           MRN: [id number] Visit Date: 04/25/2016              Requested by: Nolene Ebbs, MD [place], [place] [address] PCP: Philis Fendt, MD   Assessment & Plan: Visit Diagnoses: No diagnosis found.  Plan: Patient is doing well from my standpoint. I would like to see her back in 3 months.  Dental prophylaxis was reinforced. She is back to work now.  Follow-Up Instructions: Return in about 3 months (around 07/26/2016).   Orders:  No orders of the defined types were placed in this encounter.  No orders of the defined types were placed in this encounter.     Procedures: No procedures performed   Clinical Data: No additional findings.   Subjective: Chief Complaint  Patient presents with  . Right Knee - Pain, Follow-up    HPI The patient is almost 3 months status post right total knee replacement. She still has some mild pain. Endorses some swelling. Overall she is doing well and doing physical therapy twice a week. She is not taking any pain medicines. Review of Systems   Objective: Vital Signs: There were no vitals taken for this visit.  Physical Exam  Ortho Exam Sam of the right knee shows a well-healed surgical scar. Range of motion is progressing appropriately. Her quad strength is much better also. Specialty Comments:  No specialty comments available.  Imaging: No results found.   PMFS History: Patient Active Problem List   Diagnosis Date Noted  . 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 Allergy   . Arthritis   . Asthma   . Hypertension     Family History  Problem Relation Age of Onset  . Asthma Mother   . Colon cancer Neg Hx     Past Surgical History:  Procedure Laterality Date  . CHOLECYSTECTOMY  1993  . COLONOSCOPY    . 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 TOTAL KNEE ARTHROPLASTY  Right 01/28/2016   Procedure: RIGHT TOTAL KNEE ARTHROPLASTY;  Surgeon: [name], MD;  Location: [facility];  Service: Orthopedics;  Laterality: Right;  . TRIGGER FINGER RELEASE Right 06/18/2014   Procedure: RIGHT LONG FINGER TRIGGER RELEASE;  Surgeon: [name], MD;  Location: [facility];  Service: Orthopedics;  Laterality: Right;  . TUBAL LIGATION     Social History   Occupational History  . Not on file.   Social History Main Topics  . Smoking status: Never Smoker  . Smokeless tobacco: Never Used  . Alcohol use No  . Drug use: No  . Sexual activity: Yes    Partners: Male    Birth control/ protection: Surgical

## 2016-04-26 NOTE — Telephone Encounter (Signed)
Patient came by this morning to pickup some medical records, she said someone told her yesterdays dictation would be ready for her to pickup this morning. I wasn't able to get in touch with anyone regarding this so place call patient when ready.

## 2016-04-27 NOTE — Telephone Encounter (Signed)
Called pt to see what she wanted and she states she want office note for 04-25-16 Ready for pick up at the front desk. Patient aware

## 2016-05-11 ENCOUNTER — Telehealth (INDEPENDENT_AMBULATORY_CARE_PROVIDER_SITE_OTHER): Payer: Self-pay | Admitting: *Deleted

## 2016-05-11 NOTE — Telephone Encounter (Signed)
Pt called stating she wanted a call back regarding job accommodation paperwork that is incomplete

## 2016-05-12 NOTE — Telephone Encounter (Signed)
Called pt back and discussed disability paper work she is going to contact her case manager to see what they need and she will call me back.

## 2016-05-12 NOTE — Telephone Encounter (Signed)
Patient called again to request a phone call from you regarding the forms that have been filled out incorrectly. She does not want to relay the message as to what needs to be done, but speak directly with you.

## 2016-05-13 NOTE — Telephone Encounter (Signed)
LMOM

## 2016-05-13 NOTE — Telephone Encounter (Signed)
Pt called stating she missed a call from [place]

## 2016-05-16 NOTE — Telephone Encounter (Signed)
Spoke with pt this morning, she wanted me to print out her disability forms and they are ready for pick up at the front desk.

## 2016-05-27 ENCOUNTER — Telehealth (INDEPENDENT_AMBULATORY_CARE_PROVIDER_SITE_OTHER): Payer: Self-pay | Admitting: Orthopaedic Surgery

## 2016-05-27 NOTE — Telephone Encounter (Signed)
Patient called asked for a call back confirming that the job accommendation paperwork have been faxed.  The number to contact patient is [phone]

## 2016-05-31 NOTE — Telephone Encounter (Signed)
Called pt today to advise forms were faxed 05/26/16. They were refaxed today again

## 2016-05-31 NOTE — Telephone Encounter (Signed)
LMOM with details

## 2016-07-26 ENCOUNTER — Encounter (INDEPENDENT_AMBULATORY_CARE_PROVIDER_SITE_OTHER): Payer: Self-pay | Admitting: Orthopaedic Surgery

## 2016-07-26 ENCOUNTER — Ambulatory Visit (INDEPENDENT_AMBULATORY_CARE_PROVIDER_SITE_OTHER): Payer: 59 | Admitting: Orthopaedic Surgery

## 2016-07-26 DIAGNOSIS — G8929 Other chronic pain: Secondary | ICD-10-CM | POA: Diagnosis not present

## 2016-07-26 DIAGNOSIS — Z96651 Presence of right artificial knee joint: Secondary | ICD-10-CM

## 2016-07-26 DIAGNOSIS — M1711 Unilateral primary osteoarthritis, right knee: Secondary | ICD-10-CM | POA: Diagnosis not present

## 2016-07-26 DIAGNOSIS — M25511 Pain in right shoulder: Secondary | ICD-10-CM

## 2016-07-26 NOTE — Progress Notes (Signed)
Office Visit Note   Patient: Hannah [name]           Date of Birth: April 06, 1961           MRN: [id number] Visit Date: 07/26/2016              Requested by: Nolene Ebbs, MD [address] [place], [place] [address] PCP: Philis Fendt, MD   Assessment & Plan: Visit Diagnoses:  1. Primary osteoarthritis of right knee   2. Status post total right knee replacement   3. Chronic right shoulder pain     Plan: right TKA is doing well.  Reminded her of dental prophylaxis.  Increase activity as tolerated.  Will refer to Dr. [name] for right shoulder cortisone injection.  Will need right knee xrays on return.  Follow-Up Instructions: Return in about 6 months (around 01/23/2017).   Orders:  Orders Placed This Encounter  Procedures  . Ambulatory referral to Physical Medicine Rehab   No orders of the defined types were placed in this encounter.     Procedures: No procedures performed   Clinical Data: No additional findings.   Subjective: Chief Complaint  Patient presents with  . Right Knee - Follow-up    Patient is 6 months s/p right TKA doing well.  Also c/o right shoulder pain.  xrays of shoulder from 2015 showed end stage shoulder OA.  She's back to work and doing well.  Happy with TKA.  Taking mobic for the shoulder.      Review of Systems  Constitutional: Negative.   HENT: Negative.   Eyes: Negative.   Respiratory: Negative.   Cardiovascular: Negative.   Endocrine: Negative.   Musculoskeletal: Negative.   Neurological: Negative.   Hematological: Negative.   Psychiatric/Behavioral: Negative.   All other systems reviewed and are negative.    Objective: Vital Signs: There were no vitals taken for this visit.  Physical Exam  Constitutional: She is oriented to person, place, and time. She appears well-developed and well-nourished.  Pulmonary/Chest: Effort normal.  Neurological: She is alert and oriented to person, place, and time.  Skin: Skin is warm.  Capillary refill takes less than 2 seconds.  Psychiatric: She has a normal mood and affect. Her behavior is normal. Judgment and thought content normal.  Nursing note and vitals reviewed.   Ortho Exam Right knee - full ROM, well healed scar, no swelling Right shoulder - good ROM with pain, RC normal Specialty Comments:  No specialty comments available.  Imaging: No results found.   PMFS History: Patient Active Problem List   Diagnosis Date Noted  . Primary osteoarthritis of right knee 07/26/2016  . 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 Allergy   . Arthritis   . Asthma   . Hypertension     Family History  Problem Relation Age of Onset  . Asthma Mother   . Colon cancer Neg Hx     Past Surgical History:  Procedure Laterality Date  . CHOLECYSTECTOMY  1993  . COLONOSCOPY    . 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 TOTAL KNEE ARTHROPLASTY Right 01/28/2016   Procedure: RIGHT TOTAL KNEE ARTHROPLASTY;  Surgeon: [name], MD;  Location: [facility];  Service: Orthopedics;  Laterality: Right;  . TRIGGER FINGER RELEASE Right 06/18/2014   Procedure: RIGHT LONG FINGER TRIGGER RELEASE;  Surgeon: [name], MD;  Location: [facility];  Service: Orthopedics;  Laterality: Right;  . TUBAL  LIGATION     Social History   Occupational History  . Not on file.   Social History Main Topics  . Smoking status: Never Smoker  . Smokeless tobacco: Never Used  . Alcohol use No  . Drug use: No  . Sexual activity: Yes    Partners: Male    Birth control/ protection: Surgical

## 2016-08-05 ENCOUNTER — Ambulatory Visit (INDEPENDENT_AMBULATORY_CARE_PROVIDER_SITE_OTHER): Payer: Self-pay

## 2016-08-05 ENCOUNTER — Ambulatory Visit (INDEPENDENT_AMBULATORY_CARE_PROVIDER_SITE_OTHER): Payer: 59 | Admitting: Physical Medicine and Rehabilitation

## 2016-08-05 DIAGNOSIS — M25511 Pain in right shoulder: Secondary | ICD-10-CM

## 2016-08-05 DIAGNOSIS — G8929 Other chronic pain: Secondary | ICD-10-CM | POA: Diagnosis not present

## 2016-08-05 NOTE — Progress Notes (Signed)
Hannah [name] - 56 y.o. female MRN [id number]  Date of birth: 1960-07-23  Office Visit Note: Visit Date: 08/05/2016 PCP: Hannah Fendt, MD Referred by: Hannah Ebbs, MD  Subjective: Chief Complaint  Patient presents with  . Right Shoulder - Pain  . Left Shoulder - Pain   HPI: Hannah [name] is a 56 year old patient complaining of chronic worsening bilateral shoulder pain. Pain for several years and worse this past month. Pain with movement. Denies pain radiating down arm. She is followed by Hannah [name] who request a diagnostic and hopefully therapeutic right glenohumeral joint anesthetic arthrogram.    ROS Otherwise per HPI.  Assessment & Plan: Visit Diagnoses:  1. Chronic right shoulder pain     Plan: Findings:  Right glenohumeral joint anesthetic arthrogram. Diagnostically during the anesthetic phase she did get relief of her symptoms.    Meds & Orders: No orders of the defined types were placed in this encounter.   Orders Placed This Encounter  Procedures  . Large Joint Injection/Arthrocentesis  . XR C-ARM NO REPORT    Follow-up: Return if symptoms worsen or fail to improve.   Procedures: Large Joint Inj Date/Time: 08/05/2016 10:01 AM Performed by: Hannah [name] Authorized by: Hannah [name]   Consent Given by:  Patient Site marked: the procedure site was marked   Timeout: prior to procedure the correct patient, procedure, and site was verified   Indications:  Pain and diagnostic evaluation Location:  Shoulder Site:  R glenohumeral Prep: patient was prepped and draped in usual sterile fashion   Needle Size:  22 G Needle Length:  3.5 inches Approach:  Anteromedial Ultrasound Guidance: No   Fluoroscopic Guidance: No   Arthrogram: Yes   Medications:  80 mg triamcinolone acetonide 40 MG/ML; 3 mL bupivacaine 0.5 % Aspiration Attempted: No   Patient tolerance:  Patient tolerated the procedure well with no immediate complications  Arthrogram demonstrated  excellent flow of contrast throughout the joint surface without extravasation or obvious defect.  The patient had relief of symptoms during the anesthetic phase of the injection.     No notes on file   Clinical History: No specialty comments available.  She reports that she has never smoked. She has never used smokeless tobacco. No results for input(s): HGBA1C, LABURIC in the last 8760 hours.  Objective:  VS:  HT:    WT:   BMI:     BP:   HR: bpm  TEMP: ( )  RESP:  Physical Exam  Musculoskeletal:  Painful shoulder range of motion bilaterally. She has good strength in the upper extremities bilaterally without any deficits.    Ortho Exam Imaging: No results found.  Past Medical/Family/Surgical/Social History: Medications & Allergies reviewed per EMR Patient Active Problem List   Diagnosis Date Noted  . Primary osteoarthritis of right knee 07/26/2016  . 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 Allergy   . Arthritis   . Asthma   . Hypertension    Family History  Problem Relation Age of Onset  . Asthma Mother   . Colon cancer Neg Hx    Past Surgical History:  Procedure Laterality Date  . CHOLECYSTECTOMY  1993  . COLONOSCOPY    . 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 TOTAL KNEE ARTHROPLASTY Right 01/28/2016   Procedure: RIGHT TOTAL KNEE ARTHROPLASTY;  Surgeon: Hannah Koyanagi, MD;  Location: [facility];  Service: Orthopedics;  Laterality: Right;  . TRIGGER FINGER RELEASE Right 06/18/2014  Procedure: RIGHT LONG FINGER TRIGGER RELEASE;  Surgeon: Hannah Payment, MD;  Location: [facility];  Service: Orthopedics;  Laterality: Right;  . TUBAL LIGATION     Social History   Occupational History  . Not on file.   Social History Main Topics  . Smoking status: Never Smoker  . Smokeless tobacco: Never Used  . Alcohol use No  . Drug use: No  . Sexual activity: Yes    Partners: Male    Birth control/ protection:  Surgical

## 2016-08-05 NOTE — Patient Instructions (Signed)

## 2016-08-08 MED ORDER — TRIAMCINOLONE ACETONIDE 40 MG/ML IJ SUSP
80.0000 mg | INTRAMUSCULAR | Status: AC | PRN
  Administered 2016-08-05: 80 mg via INTRA_ARTICULAR

## 2016-08-08 MED ORDER — BUPIVACAINE HCL 0.5 % IJ SOLN
3.0000 mL | INTRAMUSCULAR | Status: AC | PRN
  Administered 2016-08-05: 3 mL via INTRA_ARTICULAR

## 2016-09-26 ENCOUNTER — Other Ambulatory Visit: Payer: Self-pay | Admitting: Obstetrics and Gynecology

## 2016-10-13 ENCOUNTER — Inpatient Hospital Stay (HOSPITAL_COMMUNITY)
Admission: RE | Admit: 2016-10-13 | Discharge: 2016-10-13 | Disposition: A | Discharge status: Home / Self Care | Payer: 59 | Source: Ambulatory Visit

## 2016-10-17 ENCOUNTER — Other Ambulatory Visit: Payer: Self-pay | Admitting: Obstetrics and Gynecology

## 2016-10-18 NOTE — H&P (Signed)
Hannah [name] is a 56 y.o. female  P: 3-0-4-3 who presents for hysterectomy because of  abnormal uterine bleeding, endometrial thickening and anemia.  Over the past year the patient has had random "period like" bleeding that lasts for 4 days requiring a pad change every 1-1.5 hours.  On occasion she will pass clots and experiences cramping rated 10/10 on a 10 point pain scale.  By taking Aleve the cramping will decrease to 7/10.    She denies any changes in bowel or bladder function and has no dyspareunia nor lower back pain.  An endometrial biopsy in April 2018 was benign with no malignancy, atypia or hyperplasia.  A pelvic ultrasound at that same time showed: anteverted uterus: 7.61 x 7.09 x 8.07 cm (length from fundus to external os = 11.2), endometrium: 0.99 cm; myometrium has appearance of adenomyosis and within endometrium a solid hypoechoic mass 4.2 cm ? sub-mucosal fibroid.   A review of both medical and surgical management options were given to the patient however,  she has chosen definitive therapy in the form of hysterectomy.   Past Medical History  OB History: G: 7;  P: 3-0-4-3;  history of ectopic, SVB 1987, 1989 and 2002 with largest infant 9 lbs. 9 oz.   GYN History: menarche: 11    LMP: September 14, 2016    Contracepton bilateral tubal ligation  History of Herpes Simplex 1 & 2;Denies history of abnormal PAP smear.   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 Sulfur Swelling    Facial swelling     Denies sensitivity to peanuts, shellfish, soy, latex or adhesives.   ROS: Admits to glasses and occasional swelling of right knee;   Denies headache, vision changes, nasal congestion, dysphagia, tinnitus, dizziness, hoarseness, cough,  chest pain, shortness of breath, nausea, vomiting, diarrhea,constipation,  urinary frequency, urgency  dysuria, hematuria, vaginitis symptoms, pelvic pain, easy bruising,  myalgias, skin rashes, unexplained weight loss and except as is mentioned in the history of present illness, patient's review of systems is otherwise negative.   Physical Exam  Bp: 128/70    P: 88 bpm   R: 20  Temperature: 98.9 degree F orally    Weight: 239 lbs.  Height: 5' 6.75 "  BMI: 37.7  Neck: supple without masses or thyromegaly Lungs: clear to auscultation Heart: regular rate and rhythm Abdomen: soft, non-tender and no organomegaly Pelvic:(from 09/14/16, patient declined exam at pre-op) EGBUS- wnl; vagina-normal with small blood; uterus-irregular and 10 weeks size, cervix without lesions or motion tenderness; adnexae-no tenderness or masses Extremities:  no clubbing, cyanosis or edema   Assesment: Abnormal Uterine Bleeding                      Thickened Endometrium           Anemia  Adenomyosis                      Uterine Fibroid   Disposition:  A discussion was held with patient regarding the indication for her procedure(s) along with the risks, which include but are not limited to: reaction to anesthesia, damage to adjacent organs, infection and excessive bleeding. The patient verbalized understanding of these risks and has consented to proceed witha Total Vaginal Hysterectomy with Bilateral Salpingectomy at [facility] on November 04, 2016   CSN# [id number]   [place] J. Florene Glen, PA-C  for Dr. Seymour Bars. Deetya Drouillard

## 2016-10-31 ENCOUNTER — Encounter (HOSPITAL_COMMUNITY)
Admission: RE | Admit: 2016-10-31 | Discharge: 2016-10-31 | Disposition: A | Discharge status: Home / Self Care | Payer: 59 | Source: Ambulatory Visit | Attending: Obstetrics and Gynecology | Admitting: Obstetrics and Gynecology

## 2016-10-31 ENCOUNTER — Encounter (HOSPITAL_COMMUNITY): Payer: Self-pay

## 2016-10-31 DIAGNOSIS — D259 Leiomyoma of uterus, unspecified: Secondary | ICD-10-CM | POA: Insufficient documentation

## 2016-10-31 DIAGNOSIS — Z01812 Encounter for preprocedural laboratory examination: Secondary | ICD-10-CM | POA: Insufficient documentation

## 2016-10-31 HISTORY — DX: Anemia, unspecified: D64.9

## 2016-10-31 LAB — BASIC METABOLIC PANEL
Anion gap: 8 (ref 5–15)
BUN: 14 mg/dL (ref 6–20)
CALCIUM: 9.1 mg/dL (ref 8.9–10.3)
CHLORIDE: 102 mmol/L (ref 101–111)
CO2: 29 mmol/L (ref 22–32)
CREATININE: 0.9 mg/dL (ref 0.44–1.00)
GFR calc non Af Amer: >60 mL/min (ref >60)
Glucose, Bld: 106 mg/dL — ABNORMAL HIGH (ref 65–99)
Potassium: 3.9 mmol/L (ref 3.5–5.1)
Sodium: 139 mmol/L (ref 135–145)

## 2016-10-31 LAB — CBC
HCT: 32.9 % — ABNORMAL LOW (ref 36.0–46.0)
Hemoglobin: 10.9 g/dL — ABNORMAL LOW (ref 12.0–15.0)
MCH: 23 pg — ABNORMAL LOW (ref 26.0–34.0)
MCHC: 33.1 g/dL (ref 30.0–36.0)
MCV: 69.6 fL — AB (ref 78.0–100.0)
PLATELETS: 250 10*3/uL (ref 150–400)
RBC: 4.73 MIL/uL (ref 3.87–5.11)
RDW: 15.8 % — AB (ref 11.5–15.5)
WBC: 7.8 10*3/uL (ref 4.0–10.5)

## 2016-10-31 NOTE — Patient Instructions (Signed)
Your procedure is scheduled on:  Friday, November 04, 2016  Enter through the [employer] [facility] at:  7:30 AM  Pick up the phone at the desk and dial [phone].  Call this number if you have problems the morning of surgery: [phone].  Remember: Do NOT eat food or drink after:  Midnight Thursday  Take these medicines the morning of surgery with a SIP OF WATER:  Hydrochlorothiazide  Stop ALL herbal medications, Aspirin, Glucosamine, and Diclofenac at this time  Do NOT smoke the day of surgery.  Do NOT wear jewelry (body piercing), metal hair clips/bobby pins, make-up, or nail polish. Do NOT wear lotions, powders, or perfumes.  You may wear deodorant. Do NOT shave for 48 hours prior to surgery. Do NOT bring valuables to the hospital. Contacts, dentures, or bridgework may not be worn into surgery.  Leave suitcase in car.  After surgery it may be brought to your room.  For patients admitted to the hospital, checkout time is 11:00 AM the day of discharge.  Bring a copy of your healthcare power of attorney and living will documents.

## 2016-11-04 ENCOUNTER — Ambulatory Visit (HOSPITAL_COMMUNITY): Payer: 59 | Admitting: Anesthesiology

## 2016-11-04 ENCOUNTER — Encounter (HOSPITAL_COMMUNITY)
Admission: RE | Disposition: A | Discharge status: Home / Self Care | Payer: Self-pay | Source: Ambulatory Visit | Attending: Obstetrics and Gynecology

## 2016-11-04 ENCOUNTER — Observation Stay (HOSPITAL_BASED_OUTPATIENT_CLINIC_OR_DEPARTMENT_OTHER)
Admission: RE | Admit: 2016-11-04 | Discharge: 2016-11-05 | Disposition: A | Discharge status: Home / Self Care | Payer: 59 | Source: Ambulatory Visit | Attending: Obstetrics and Gynecology | Admitting: Obstetrics and Gynecology

## 2016-11-04 ENCOUNTER — Encounter (HOSPITAL_COMMUNITY): Payer: Self-pay

## 2016-11-04 DIAGNOSIS — E559 Vitamin D deficiency, unspecified: Secondary | ICD-10-CM | POA: Diagnosis not present

## 2016-11-04 DIAGNOSIS — N809 Endometriosis, unspecified: Secondary | ICD-10-CM

## 2016-11-04 DIAGNOSIS — N838 Other noninflammatory disorders of ovary, fallopian tube and broad ligament: Secondary | ICD-10-CM | POA: Insufficient documentation

## 2016-11-04 DIAGNOSIS — N8003 Adenomyosis of the uterus: Secondary | ICD-10-CM

## 2016-11-04 DIAGNOSIS — Z8049 Family history of malignant neoplasm of other genital organs: Secondary | ICD-10-CM | POA: Diagnosis not present

## 2016-11-04 DIAGNOSIS — D219 Benign neoplasm of connective and other soft tissue, unspecified: Secondary | ICD-10-CM

## 2016-11-04 DIAGNOSIS — N920 Excessive and frequent menstruation with regular cycle: Secondary | ICD-10-CM | POA: Diagnosis not present

## 2016-11-04 DIAGNOSIS — D259 Leiomyoma of uterus, unspecified: Secondary | ICD-10-CM | POA: Diagnosis present

## 2016-11-04 DIAGNOSIS — D649 Anemia, unspecified: Secondary | ICD-10-CM | POA: Diagnosis not present

## 2016-11-04 DIAGNOSIS — Z6838 Body mass index (BMI) 38.0-38.9, adult: Secondary | ICD-10-CM | POA: Insufficient documentation

## 2016-11-04 DIAGNOSIS — E668 Other obesity: Secondary | ICD-10-CM | POA: Diagnosis not present

## 2016-11-04 DIAGNOSIS — I10 Essential (primary) hypertension: Secondary | ICD-10-CM | POA: Diagnosis not present

## 2016-11-04 DIAGNOSIS — J45909 Unspecified asthma, uncomplicated: Secondary | ICD-10-CM | POA: Diagnosis not present

## 2016-11-04 DIAGNOSIS — Z79899 Other long term (current) drug therapy: Secondary | ICD-10-CM | POA: Insufficient documentation

## 2016-11-04 HISTORY — PX: VAGINAL HYSTERECTOMY: SHX2639

## 2016-11-04 SURGERY — HYSTERECTOMY, VAGINAL
Anesthesia: General | Site: Vagina | Laterality: Bilateral

## 2016-11-04 MED ORDER — CEFOTETAN DISODIUM-DEXTROSE 2-2.08 GM-% IV SOLR
INTRAVENOUS | Status: AC
  Filled 2016-11-04: qty 50

## 2016-11-04 MED ORDER — SODIUM CHLORIDE 0.9% FLUSH
INTRAVENOUS | Status: AC
  Filled 2016-11-04: qty 6

## 2016-11-04 MED ORDER — DEXAMETHASONE SODIUM PHOSPHATE 4 MG/ML IJ SOLN
INTRAMUSCULAR | Status: AC
  Filled 2016-11-04: qty 1

## 2016-11-04 MED ORDER — KETOROLAC TROMETHAMINE 30 MG/ML IJ SOLN
30.0000 mg | Freq: Four times a day (QID) | INTRAMUSCULAR | Status: AC
  Administered 2016-11-04 (×2): 30 mg via INTRAVENOUS
  Filled 2016-11-04 (×2): qty 1

## 2016-11-04 MED ORDER — FENTANYL CITRATE (PF) 100 MCG/2ML IJ SOLN
INTRAMUSCULAR | Status: DC | PRN
  Administered 2016-11-04: 100 ug via INTRAVENOUS
  Administered 2016-11-04: 50 ug via INTRAVENOUS
  Administered 2016-11-04: 100 ug via INTRAVENOUS

## 2016-11-04 MED ORDER — HYDROMORPHONE HCL 1 MG/ML IJ SOLN
INTRAMUSCULAR | Status: AC
  Administered 2016-11-04: 0.5 mg via INTRAVENOUS
  Filled 2016-11-04: qty 1

## 2016-11-04 MED ORDER — HYDROMORPHONE HCL 1 MG/ML IJ SOLN
0.2500 mg | INTRAMUSCULAR | Status: DC | PRN
  Administered 2016-11-04 (×2): 0.5 mg via INTRAVENOUS

## 2016-11-04 MED ORDER — HYDROMORPHONE HCL 1 MG/ML IJ SOLN
INTRAMUSCULAR | Status: AC
  Filled 2016-11-04: qty 1

## 2016-11-04 MED ORDER — OXYCODONE HCL 5 MG PO TABS: 5.0000 mg | ORAL_TABLET | Freq: Once | ORAL | Status: DC | PRN

## 2016-11-04 MED ORDER — VASOPRESSIN 20 UNIT/ML IV SOLN
INTRAVENOUS | Status: DC | PRN
  Administered 2016-11-04: 50 mL via INTRAMUSCULAR

## 2016-11-04 MED ORDER — LACTATED RINGERS IV SOLN
INTRAVENOUS | Status: DC
  Administered 2016-11-04 – 2016-11-05 (×3): via INTRAVENOUS

## 2016-11-04 MED ORDER — DEXAMETHASONE SODIUM PHOSPHATE 10 MG/ML IJ SOLN
INTRAMUSCULAR | Status: DC | PRN
  Administered 2016-11-04: 4 mg via INTRAVENOUS

## 2016-11-04 MED ORDER — OXYCODONE HCL 5 MG/5ML PO SOLN: 5.0000 mg | Freq: Once | ORAL | Status: DC | PRN

## 2016-11-04 MED ORDER — ONDANSETRON HCL 4 MG/2ML IJ SOLN
INTRAMUSCULAR | Status: DC | PRN
  Administered 2016-11-04: 4 mg via INTRAVENOUS

## 2016-11-04 MED ORDER — LIDOCAINE HCL (CARDIAC) 20 MG/ML IV SOLN
INTRAVENOUS | Status: AC
  Filled 2016-11-04: qty 5

## 2016-11-04 MED ORDER — FENTANYL CITRATE (PF) 250 MCG/5ML IJ SOLN
INTRAMUSCULAR | Status: AC
  Filled 2016-11-04: qty 5

## 2016-11-04 MED ORDER — VASOPRESSIN 20 UNIT/ML IV SOLN
INTRAVENOUS | Status: AC
  Filled 2016-11-04: qty 1

## 2016-11-04 MED ORDER — PROPOFOL 10 MG/ML IV BOLUS
INTRAVENOUS | Status: DC | PRN
  Administered 2016-11-04: 200 mg via INTRAVENOUS

## 2016-11-04 MED ORDER — ROCURONIUM BROMIDE 100 MG/10ML IV SOLN
INTRAVENOUS | Status: DC | PRN
  Administered 2016-11-04: 10 mg via INTRAVENOUS
  Administered 2016-11-04: 50 mg via INTRAVENOUS

## 2016-11-04 MED ORDER — SUGAMMADEX SODIUM 500 MG/5ML IV SOLN
INTRAVENOUS | Status: DC | PRN
  Administered 2016-11-04: 218.2 mg via INTRAVENOUS

## 2016-11-04 MED ORDER — KETOROLAC TROMETHAMINE 30 MG/ML IJ SOLN
INTRAMUSCULAR | Status: AC
  Filled 2016-11-04: qty 1

## 2016-11-04 MED ORDER — SCOPOLAMINE 1 MG/3DAYS TD PT72
1.0000 | MEDICATED_PATCH | Freq: Once | TRANSDERMAL | Status: DC
  Administered 2016-11-04: 1.5 mg via TRANSDERMAL

## 2016-11-04 MED ORDER — LIDOCAINE HCL (CARDIAC) 20 MG/ML IV SOLN
INTRAVENOUS | Status: DC | PRN
  Administered 2016-11-04: 100 mg via INTRAVENOUS

## 2016-11-04 MED ORDER — LISINOPRIL 10 MG PO TABS
10.0000 mg | ORAL_TABLET | Freq: Once | ORAL | Status: AC
  Administered 2016-11-04: 10 mg via ORAL
  Filled 2016-11-04: qty 1

## 2016-11-04 MED ORDER — KETOROLAC TROMETHAMINE 30 MG/ML IJ SOLN
INTRAMUSCULAR | Status: DC | PRN
  Administered 2016-11-04: 30 mg via INTRAVENOUS

## 2016-11-04 MED ORDER — SCOPOLAMINE 1 MG/3DAYS TD PT72
MEDICATED_PATCH | TRANSDERMAL | Status: AC
  Administered 2016-11-04: 1.5 mg via TRANSDERMAL
  Filled 2016-11-04: qty 1

## 2016-11-04 MED ORDER — HYDROMORPHONE HCL 1 MG/ML IJ SOLN
1.0000 mg | INTRAMUSCULAR | Status: DC | PRN
  Administered 2016-11-04: 1 mg via INTRAVENOUS
  Filled 2016-11-04: qty 1

## 2016-11-04 MED ORDER — DOCUSATE SODIUM 100 MG PO CAPS
100.0000 mg | ORAL_CAPSULE | Freq: Two times a day (BID) | ORAL | Status: DC
  Administered 2016-11-04 – 2016-11-05 (×2): 100 mg via ORAL
  Filled 2016-11-04 (×2): qty 1

## 2016-11-04 MED ORDER — LACTATED RINGERS IV SOLN
INTRAVENOUS | Status: DC
  Administered 2016-11-04 (×2): via INTRAVENOUS
  Administered 2016-11-04: 125 mL/h via INTRAVENOUS
  Administered 2016-11-04: 09:00:00 via INTRAVENOUS

## 2016-11-04 MED ORDER — MIDAZOLAM HCL 2 MG/2ML IJ SOLN
INTRAMUSCULAR | Status: AC
  Filled 2016-11-04: qty 2

## 2016-11-04 MED ORDER — IBUPROFEN 600 MG PO TABS: 600.0000 mg | ORAL_TABLET | Freq: Four times a day (QID) | ORAL | Status: DC | PRN

## 2016-11-04 MED ORDER — ONDANSETRON HCL 4 MG/2ML IJ SOLN
INTRAMUSCULAR | Status: AC
  Filled 2016-11-04: qty 2

## 2016-11-04 MED ORDER — MENTHOL 3 MG MT LOZG: 1.0000 | LOZENGE | OROMUCOSAL | Status: DC | PRN

## 2016-11-04 MED ORDER — PROPOFOL 10 MG/ML IV BOLUS
INTRAVENOUS | Status: AC
Start: 2016-11-04 — End: ?
  Filled 2016-11-04: qty 20

## 2016-11-04 MED ORDER — MEPERIDINE HCL 25 MG/ML IJ SOLN: 6.2500 mg | INTRAMUSCULAR | Status: DC | PRN

## 2016-11-04 MED ORDER — KETOROLAC TROMETHAMINE 30 MG/ML IJ SOLN: 30.0000 mg | Freq: Once | INTRAMUSCULAR | Status: DC | PRN

## 2016-11-04 MED ORDER — ONDANSETRON HCL 4 MG PO TABS
4.0000 mg | ORAL_TABLET | Freq: Three times a day (TID) | ORAL | Status: DC | PRN
  Administered 2016-11-04: 4 mg via ORAL
  Filled 2016-11-04: qty 1

## 2016-11-04 MED ORDER — HYDROMORPHONE HCL 1 MG/ML IJ SOLN
INTRAMUSCULAR | Status: DC | PRN
  Administered 2016-11-04: 1 mg via INTRAVENOUS

## 2016-11-04 MED ORDER — ROCURONIUM BROMIDE 100 MG/10ML IV SOLN
INTRAVENOUS | Status: AC
  Filled 2016-11-04: qty 1

## 2016-11-04 MED ORDER — PROMETHAZINE HCL 25 MG/ML IJ SOLN: 6.2500 mg | INTRAMUSCULAR | Status: DC | PRN

## 2016-11-04 MED ORDER — OXYCODONE-ACETAMINOPHEN 5-325 MG PO TABS
1.0000 | ORAL_TABLET | ORAL | Status: DC | PRN
  Administered 2016-11-04 – 2016-11-05 (×2): 1 via ORAL
  Filled 2016-11-04 (×2): qty 1

## 2016-11-04 MED ORDER — CEFOTETAN DISODIUM-DEXTROSE 2-2.08 GM-% IV SOLR
2.0000 g | INTRAVENOUS | Status: AC
  Administered 2016-11-04: 2 g via INTRAVENOUS

## 2016-11-04 SURGICAL SUPPLY — 28 items
CANISTER SUCT 3000ML PPV (MISCELLANEOUS) ×2 IMPLANT
CLOTH BEACON ORANGE TIMEOUT ST (SAFETY) ×2 IMPLANT
CONT PATH 16OZ SNAP LID 3702 (MISCELLANEOUS) ×1 IMPLANT
DECANTER SPIKE VIAL GLASS SM (MISCELLANEOUS) ×1 IMPLANT
DRAPE SHEET LG 3/4 BI-LAMINATE (DRAPES) ×4 IMPLANT
GAUZE PACKING 2X5 YD STRL (GAUZE/BANDAGES/DRESSINGS) IMPLANT
GLOVE BIOGEL PI IND STRL 6.5 (GLOVE) ×1 IMPLANT
GLOVE BIOGEL PI IND STRL 7.0 (GLOVE) ×1 IMPLANT
GLOVE BIOGEL PI INDICATOR 6.5 (GLOVE) ×1
GLOVE BIOGEL PI INDICATOR 7.0 (GLOVE) ×1
GLOVE SURG SS PI 6.5 STRL IVOR (GLOVE) ×4 IMPLANT
GOWN STRL REUS W/TWL LRG LVL3 (GOWN DISPOSABLE) ×8 IMPLANT
NDL MAYO CATGUT SZ4 TPR NDL (NEEDLE) ×1 IMPLANT
NDL SPNL 22GX3.5 QUINCKE BK (NEEDLE) ×1 IMPLANT
NEEDLE MAYO CATGUT SZ4 (NEEDLE) ×2 IMPLANT
NEEDLE SPNL 22GX3.5 QUINCKE BK (NEEDLE) ×2 IMPLANT
NS IRRIG 1000ML POUR BTL (IV SOLUTION) ×2 IMPLANT
PACK TRENDGUARD 600 HYBRD PROC (MISCELLANEOUS) IMPLANT
PACK VAGINAL WOMENS (CUSTOM PROCEDURE TRAY) ×2 IMPLANT
PAD MAGNETIC INST (MISCELLANEOUS) ×2 IMPLANT
PAD OB MATERNITY 4.3X12.25 (PERSONAL CARE ITEMS) ×2 IMPLANT
SUT CHROMIC 2 0 TIES 18 (SUTURE) IMPLANT
SUT VIC AB 0 CT1 18XCR BRD8 (SUTURE) ×3 IMPLANT
SUT VIC AB 0 CT1 8-18 (SUTURE) ×6
SUT VICRYL 0 TIES 12 18 (SUTURE) ×2 IMPLANT
TOWEL OR 17X24 6PK STRL BLUE (TOWEL DISPOSABLE) ×4 IMPLANT
TRAY FOLEY CATH SILVER 14FR (SET/KITS/TRAYS/PACK) ×2 IMPLANT
TRENDGUARD 600 HYBRID PROC PK (MISCELLANEOUS)

## 2016-11-04 NOTE — Progress Notes (Signed)
MD notified about high BP when pt arrived to floor from PACU. MD stated to recheck in hour and notify her of the results.

## 2016-11-04 NOTE — Anesthesia Postprocedure Evaluation (Signed)
Anesthesia Post Note  Patient: Hannah [name]  Procedure(s) Performed: Procedure(s) (LRB): HYSTERECTOMY VAGINAL with Bilateral Salpingectomy (Bilateral)     Patient location during evaluation: PACU Anesthesia Type: General Level of consciousness: sedated and patient cooperative Pain management: pain level controlled Vital Signs Assessment: post-procedure vital signs reviewed and stable Respiratory status: spontaneous breathing Cardiovascular status: stable Anesthetic complications: no    Last Vitals:  Vitals:   11/04/16 1415 11/04/16 1538  BP: (!) 174/92 (!) 177/97  Pulse: 100 (!) 102  Resp:  18  Temp: 36.6 C 36.8 C    Last Pain:  Vitals:   11/04/16 1600  TempSrc:   PainSc: Asleep   Pain Goal: Patients Stated Pain Goal: 3 (11/04/16 1530)               Nolon Nations

## 2016-11-04 NOTE — Transfer of Care (Signed)
Immediate Anesthesia Transfer of Care Note  Patient: Hannah [name]  Procedure(s) Performed: Procedure(s): HYSTERECTOMY VAGINAL with Bilateral Salpingectomy (Bilateral)  Patient Location: PACU  Anesthesia Type:General  Level of Consciousness: awake, alert  and oriented  Airway & Oxygen Therapy: Patient Spontanous Breathing and Patient connected to nasal cannula oxygen  Post-op Assessment: Report given to RN and Post -op Vital signs reviewed and stable  Post vital signs: Reviewed and stable  Last Vitals:  Vitals:   11/04/16 0748  BP: 129/84  Pulse: 87  Resp: 20  Temp: 36.9 C    Last Pain:  Vitals:   11/04/16 0748  TempSrc: Oral      Patients Stated Pain Goal: 5 (24/49/75 3005)  Complications: No apparent anesthesia complications

## 2016-11-04 NOTE — Progress Notes (Signed)
Md notified of high BP 177/97. One time dose of Lisinopril 10 mg PO ordered.

## 2016-11-04 NOTE — Progress Notes (Signed)
Day of Surgery Procedure(s) (LRB): HYSTERECTOMY VAGINAL with Bilateral Salpingectomy (Bilateral)  Subjective: Patient reports nausea, vomiting, incisional pain and tolerating PO.    Objective: I have reviewed patient's vital signs, intake and output and medications.  General: cooperative, mild distress, moderately obese and sleepy Resp: clear to auscultation bilaterally Cardio: regular rate and rhythm, S1, S2 normal, no murmur, click, rub or gallop GI: soft, non-tender; bowel sounds normal; no masses,  no organomegaly Extremities: extremities normal, atraumatic, no cyanosis or edema Vaginal Bleeding: small amount.   Pad changed  Assessment: s/[name] Procedure(s): HYSTERECTOMY VAGINAL with Bilateral Salpingectomy (Bilateral): stable, progressing well and post op hypertension managed with a single dose of lisinopril  Plan: Advance diet Encourage ambulation Advance to PO medication Discharge home in am  LOS: 0 days    Hannah [name] 11/04/2016, 7:11 PM

## 2016-11-04 NOTE — Op Note (Signed)
OPERATIVE REPORT   INDICATIONS:pelvic pain and Menorrhagia  PRE-OP DIAGNOSIS:Fibroids and menorrhagia  POST-OP DIAGNOSIS;Fibroids and menorrhagia  PROCEDURE:Procedure(s) (LRB): HYSTERECTOMY VAGINAL with Bilateral Salpingectomy (Bilateral)   SURGEON:Fredric Slabach P  ASSIST: Earnstine Regal certified physician assistant  SPECIMENS: Uterus, cervix, portions of bilateral fallopian tubes  DISPOSITION OF SPECIMEN:Delivered to Histology  EBL: 244 cc  COMPLICATIONS: None   PATIENT TO:  PACU - hemodynamically stable.  No pharmacologic DVT prophylaxis was used. Because of risk of bleeding and anticipated early ambulation. SCD hose were used throughout the procedure.  PROCEDURE DETAILS: The patient was taken to the operating room after appropriate identification and placed on the operating table.  After the attainment of adequate general anesthesia the patient was placed in the lithotomy position. A timeout was performed. The perineum and vagina were prepped with multiple layers of Betadine. A Foley catheter was inserted into the bladder and connected to straight drainage.The perineum was draped as a sterile field. A weighted speculum was placed in the posterior vagina and Lahey tenaculae were placed on the anterior and posterior surfaces of the cervix. The cervicovaginal mucosa was injected with a dilute solution of Pitressin. The cervix was circumscribed. The anterior vaginal mucosa wasbluntly dissected off the anterior cervix and the anterior peritoneum entered. The bladder blade was placed and the bladder elevated. The posterior peritoneum was entered sharply and tagged. Uterosacral ligaments on the right and left side were clamped cut and suture ligated, and the sutures held. The paracervical tissues were then clamped cut and suture ligated. The uterine arteries were clamped cut and suture ligated. The parametral tissues were clamped, cut, and suture ligated.The uterus was inverted and the upper  pedicles clamped cut tied with free tie and suture-ligated. The uterus and cervix were removed from the operative field. The left fallopian tube was grasped with Babcock clamp and the distal portion clamped cut and suture-ligated. Then removed from the operative field and a suture placed through that specimen. A similar procedure was carried out on the opposite side.  The McCall culdoplasty sutures were then placed incorporating the uterosacral ligaments on either side, and the intervening peritoneum.  These were held. The vaginal angles were created with the sutures from the  uterosacral ligaments that had been held and placed through the anterior and posterior surfaces of the vagina then tied down. The remainder of the vaginal cuff was closed with figure-of-eight sutures of.  All sutures used were 0 Vicryl.  The McCall culdoplasty sutures were then tied down. Hemostasis was noted to be adequate. The patient was awakened from general anesthesia and taken to the recovery room in satisfactory condition having tolerated the procedure well the sponge and instrument counts correct.  [name], MD 11:52 AM

## 2016-11-04 NOTE — Anesthesia Preprocedure Evaluation (Signed)
Anesthesia Evaluation  Patient identified by MRN, date of birth, ID band Patient awake    Reviewed: Allergy & Precautions, NPO status , Patient's Chart, lab work & pertinent test results  Airway Mallampati: II  TM Distance: >3 FB Neck ROM: Full    Dental  (+) Teeth Intact, Dental Advisory Given   Pulmonary asthma ,    breath sounds clear to auscultation       Cardiovascular hypertension,  Rhythm:Regular Rate:Normal     Neuro/Psych    GI/Hepatic   Endo/Other    Renal/GU      Musculoskeletal  (+) Arthritis ,   Abdominal (+) + obese,   Peds  Hematology  (+) anemia ,   Anesthesia Other Findings   Reproductive/Obstetrics                             Anesthesia Physical  Anesthesia Plan  ASA: II  Anesthesia Plan: General   Post-op Pain Management:    Induction: Intravenous  PONV Risk Score and Plan: 3 and 4 or greater and Ondansetron, Dexamethasone, Propofol, Midazolam, Scopolamine patch - Pre-op and Promethazine  Airway Management Planned: Oral ETT  Additional Equipment:   Intra-op Plan:   Post-operative Plan: Extubation in OR  Informed Consent: I have reviewed the patients History and Physical, chart, labs and discussed the procedure including the risks, benefits and alternatives for the proposed anesthesia with the patient or authorized representative who has indicated his/her understanding and acceptance.   Dental advisory given  Plan Discussed with: CRNA  Anesthesia Plan Comments:         Anesthesia Quick Evaluation

## 2016-11-04 NOTE — Anesthesia Procedure Notes (Signed)
Procedure Name: Intubation Date/Time: 11/04/2016 9:22 AM Performed by: [employer], Sheron Nightingale Pre-anesthesia Checklist: Patient identified, Emergency Drugs available, Suction available, Patient being monitored and Timeout performed Patient Re-evaluated:Patient Re-evaluated prior to inductionOxygen Delivery Method: Circle system utilized Preoxygenation: Pre-oxygenation with 100% oxygen Intubation Type: IV induction Ventilation: Mask ventilation without difficulty and Oral airway inserted - appropriate to patient size Laryngoscope Size: Mac and 3 Grade View: Grade II Tube size: 7.0 mm Number of attempts: 1 Placement Confirmation: ETT inserted through vocal cords under direct vision,  positive ETCO2 and breath sounds checked- equal and bilateral Secured at: 22 cm Dental Injury: Teeth and Oropharynx as per pre-operative assessment

## 2016-11-05 ENCOUNTER — Encounter (HOSPITAL_COMMUNITY): Payer: Self-pay | Admitting: Obstetrics and Gynecology

## 2016-11-05 DIAGNOSIS — D259 Leiomyoma of uterus, unspecified: Secondary | ICD-10-CM | POA: Diagnosis not present

## 2016-11-05 LAB — CBC
HCT: 29.4 % — ABNORMAL LOW (ref 36.0–46.0)
Hemoglobin: 9.8 g/dL — ABNORMAL LOW (ref 12.0–15.0)
MCH: 22.8 pg — ABNORMAL LOW (ref 26.0–34.0)
MCHC: 33.3 g/dL (ref 30.0–36.0)
MCV: 68.5 fL — ABNORMAL LOW (ref 78.0–100.0)
PLATELETS: 238 10*3/uL (ref 150–400)
RBC: 4.29 MIL/uL (ref 3.87–5.11)
RDW: 15.7 % — ABNORMAL HIGH (ref 11.5–15.5)
WBC: 15.5 10*3/uL — AB (ref 4.0–10.5)

## 2016-11-05 MED ORDER — OXYCODONE-ACETAMINOPHEN 5-325 MG PO TABS: 1.0000 | ORAL_TABLET | ORAL | 0 refills | Status: DC | PRN

## 2016-11-05 MED ORDER — ONDANSETRON HCL 4 MG PO TABS: 4.0000 mg | ORAL_TABLET | Freq: Three times a day (TID) | ORAL | 0 refills | Status: DC | PRN

## 2016-11-05 MED ORDER — IBUPROFEN 600 MG PO TABS: ORAL_TABLET | ORAL | 0 refills | Status: DC

## 2016-11-05 MED ORDER — DOCUSATE SODIUM 100 MG PO CAPS: 100.0000 mg | ORAL_CAPSULE | Freq: Two times a day (BID) | ORAL | 0 refills | Status: DC

## 2016-11-05 NOTE — Discharge Instructions (Signed)
Vaginal Hysterectomy, Care After °Refer to this sheet in the next few weeks. These instructions provide you with information about caring for yourself after your procedure. Your health care provider may also give you more specific instructions. Your treatment has been planned according to current medical practices, but problems sometimes occur. Call your health care provider if you have any problems or questions after your procedure. °What can I expect after the procedure? °After the procedure, it is common to have: °· Pain. °· Soreness and numbness in your incision areas. °· Vaginal bleeding and discharge. °· Constipation. °· Temporary problems emptying the bladder. °· Feelings of sadness or other emotions. ° °Follow these instructions at home: °Medicines °· Take over-the-counter and prescription medicines only as told by your health care provider. °· If you were prescribed an antibiotic medicine, take it as told by your health care provider. Do not stop taking the antibiotic even if you start to feel better. °· Do not drive or operate heavy machinery while taking prescription pain medicine. °Activity °· Return to your normal activities as told by your health care provider. Ask your health care provider what activities are safe for you. °· Get regular exercise as told by your health care provider. You may be told to take short walks every day and go farther each time. °· Do not lift anything that is heavier than 10 lb (4.5 kg). °General instructions ° °· Do not put anything in your vagina for 6 weeks after your surgery or as told by your health care provider. This includes tampons and douches. °· Do not have sex until your health care provider says you can. °· Do not take baths, swim, or use a hot tub until your health care provider approves. °· Drink enough fluid to keep your urine clear or pale yellow. °· Do not drive for 24 hours if you were given a sedative. °· Keep all follow-up visits as told by your health  care provider. This is important. °Contact a health care provider if: °· Your pain medicine is not helping. °· You have a fever. °· You have redness, swelling, or pain at your incision site. °· You have blood, pus, or a bad-smelling discharge from your vagina. °· You continue to have difficulty urinating. °Get help right away if: °· You have severe abdominal or back pain. °· You have heavy bleeding from your vagina. °· You have chest pain or shortness of breath. °This information is not intended to replace advice given to you by your health care provider. Make sure you discuss any questions you have with your health care provider. °Document Released: 09/07/2015 Document Revised: 10/22/2015 Document Reviewed: 05/31/2015 °Elsevier Interactive Patient Education © 2018 [employer] ° °

## 2016-11-05 NOTE — Progress Notes (Signed)
Discharge instructions reviewed and signed.  Patient's questions answered.

## 2016-11-05 NOTE — Discharge Summary (Signed)
Physician Discharge Summary  Patient ID: Hannah [name] MRN: [id number] DOB/AGE: 54-Nov-1962 54 y.o.  Admit date: 11/04/2016 Discharge date: 11/05/2016  Admission Diagnoses: menorrhagia, fibroids, pelvic pain  Discharge Diagnoses:  Active Problems:   Menorrhagia   Fibroid tumor   Adenomyosis   Hypertension, essential   Discharged Condition: good  Hospital Course: Pt underwent vaginal hysterectomy on the day of admission.  She progressed well post operatively.  On POD#1, she was eating a regular diet, ambulating without difficulty, voiding well with minimal vaginal bleeding and no nausea or vomiting.  She received good relief from her po pain meds, was afebrile with stable vital signs and was deemed ready for discharge home  Consults: None  Significant Diagnostic Studies: labs: CBC and pathology (pending at time of discharge)  Treatments: IV hydration, antibiotics: cefotetan for surgical prophylaxis, analgesia: acetaminophen w/ codeine and Vicodin and surgery:elective total vaginal hysterectomy and bilateral salpingectomy  Discharge Exam: Blood pressure 135/75, pulse 99, temperature 98.5 F (36.9 C), temperature source Oral, resp. rate 18, height 5' 6.5" (1.689 m), weight 240 lb (108.9 kg), last menstrual period 10/22/2016, SpO2 92 %. General appearance: alert, cooperative, appears stated age, no distress and mildly obese Resp: clear to auscultation bilaterally Heart:  Regular rate and rhythm GI: soft, non-tender; bowel sounds normal; no masses,  no organomegaly and appropriate for POD#1 Minimal vaginal bleeding  Disposition: 06-Home-Health Care Svc   Allergies as of 11/05/2016      Reactions   Sulfur Swelling   Facial swelling       Medication List    STOP taking these medications   meloxicam 7.5 MG tablet Commonly known as:  MOBIC     TAKE these medications   budesonide-formoterol 160-4.5 MCG/ACT inhaler Commonly known as:  SYMBICORT Inhale 2 puffs into the lungs  daily as needed.   docusate sodium 100 MG capsule Commonly known as:  COLACE Take 1 capsule (100 mg total) by mouth 2 (two) times daily. Notes to patient:  TAKE AS NEEDED FOR CONSTIPATION   ferrous sulfate 325 (65 FE) MG tablet Take 325 mg by mouth 2 (two) times daily with a meal.   hydrochlorothiazide 12.5 MG tablet Commonly known as:  HYDRODIURIL Take 12.5 mg by mouth daily.   ibuprofen 600 MG tablet Commonly known as:  ADVIL,MOTRIN 600 MG BY MOUTH EVERY 6 HOURS FOR 5 DAYS, THEN EVERY 6 HOURS AS NEEDED FOR PAIN.  DON'T TAKE MOBIC WHILE TAKING IBUPROFEN   multivitamin tablet Take 1 tablet by mouth daily.   ondansetron 4 MG tablet Commonly known as:  ZOFRAN Take 1 tablet (4 mg total) by mouth every 8 (eight) hours as needed for nausea or vomiting.   oxyCODONE-acetaminophen 5-325 MG tablet Commonly known as:  PERCOCET/ROXICET Take 1-2 tablets by mouth every 4 (four) hours as needed for severe pain (moderate to severe pain (when tolerating fluids)).   SUPER B COMPLEX PO Take 1 tablet by mouth daily.   Vitamin D (Ergocalciferol) 50000 units Caps capsule Commonly known as:  DRISDOL Take 50,000 Units by mouth every Monday.        Signed: Sidnie Swalley P 11/05/2016, 11:18 AM

## 2017-01-23 ENCOUNTER — Ambulatory Visit (INDEPENDENT_AMBULATORY_CARE_PROVIDER_SITE_OTHER): Payer: 59

## 2017-01-23 ENCOUNTER — Ambulatory Visit (INDEPENDENT_AMBULATORY_CARE_PROVIDER_SITE_OTHER): Payer: 59 | Admitting: Orthopaedic Surgery

## 2017-01-23 ENCOUNTER — Encounter (INDEPENDENT_AMBULATORY_CARE_PROVIDER_SITE_OTHER): Payer: Self-pay | Admitting: Orthopaedic Surgery

## 2017-01-23 DIAGNOSIS — G8929 Other chronic pain: Secondary | ICD-10-CM

## 2017-01-23 DIAGNOSIS — M1711 Unilateral primary osteoarthritis, right knee: Secondary | ICD-10-CM

## 2017-01-23 DIAGNOSIS — M25511 Pain in right shoulder: Secondary | ICD-10-CM | POA: Diagnosis not present

## 2017-01-23 NOTE — Progress Notes (Signed)
Office Visit Note   Patient: Hannah [name]           Date of Birth: Dec 12, 1960           MRN: [id number] Visit Date: 01/23/2017              Requested by: Nolene Ebbs, MD [address] [place], [place] [address] PCP: [name], MD   Assessment & Plan: Visit Diagnoses:  1. Primary osteoarthritis of right knee   2. Chronic right shoulder pain     Plan: From the right knee standpoint patient is doing well. I reminded her of her dental prophylaxis. For her right shoulder she has failed conservative treatment. Recommend MRI to rule out structural abnormalities. Follow-up after the MRI.  Follow-Up Instructions: Return in about 2 weeks (around 02/06/2017).   Orders:  Orders Placed This Encounter  Procedures  . XR KNEE 3 VIEW RIGHT   No orders of the defined types were placed in this encounter.     Procedures: No procedures performed   Clinical Data: No additional findings.   Subjective: Chief Complaint  Patient presents with  . Right Shoulder - Pain, Follow-up  . Right Knee - Pain, Follow-up    Patient is one year status post right total knee replacement. She is doing well. She complains of some occasional stiffness and soreness after periods of immobility. Her right shoulder still continues to bother her. She had temporary relief from a right shoulder injection with Dr. [name] 5 months ago. He denies any radicular symptoms.    Review of Systems  Constitutional: Negative.   HENT: Negative.   Eyes: Negative.   Respiratory: Negative.   Cardiovascular: Negative.   Endocrine: Negative.   Musculoskeletal: Negative.   Neurological: Negative.   Hematological: Negative.   Psychiatric/Behavioral: Negative.   All other systems reviewed and are negative.    Objective: Vital Signs: There were no vitals taken for this visit.  Physical Exam  Constitutional: She is oriented to person, place, and time. She appears well-developed and well-nourished.    Pulmonary/Chest: Effort normal.  Neurological: She is alert and oriented to person, place, and time.  Skin: Skin is warm. Capillary refill takes less than 2 seconds.  Psychiatric: She has a normal mood and affect. Her behavior is normal. Judgment and thought content normal.  Nursing note and vitals reviewed.   Ortho Exam Right knee exam shows a fully healed surgical scar. She has excellent range of motion. No swelling. Right shoulder exam shows positive impingement signs. Rotator cuff is intact but is somewhat limited by pain. Specialty Comments:  No specialty comments available.  Imaging: Xr Knee 3 View Right  Result Date: 01/23/2017 Stable right total knee replacement in good alignment    PMFS History: Patient Active Problem List   Diagnosis Date Noted  . Menorrhagia 11/04/2016  . Fibroid tumor 11/04/2016  . Adenomyosis 11/04/2016  . Hypertension, essential 11/04/2016  . Primary osteoarthritis of right knee 07/26/2016  . 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 Allergy   . Anemia   . Arthritis   . Asthma   . Hypertension     Family History  Problem Relation Age of Onset  . Asthma Mother   . Colon cancer Neg Hx     Past Surgical History:  Procedure Laterality Date  . CHOLECYSTECTOMY  1993  . COLONOSCOPY    . 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 TOTAL KNEE ARTHROPLASTY Right  01/28/2016   Procedure: RIGHT TOTAL KNEE ARTHROPLASTY;  Surgeon: [name], MD;  Location: [facility];  Service: Orthopedics;  Laterality: Right;  . TRIGGER FINGER RELEASE Right 06/18/2014   Procedure: RIGHT LONG FINGER TRIGGER RELEASE;  Surgeon: [name], MD;  Location: [facility];  Service: Orthopedics;  Laterality: Right;  . TUBAL LIGATION    . VAGINAL HYSTERECTOMY Bilateral 11/04/2016   Procedure: HYSTERECTOMY VAGINAL with Bilateral Salpingectomy;  Surgeon: [name], MD;  Location: [facility];  Service:  Gynecology;  Laterality: Bilateral;   Social History   Occupational History  . Not on file.   Social History Main Topics  . Smoking status: Never Smoker  . Smokeless tobacco: Never Used  . Alcohol use No  . Drug use: No  . Sexual activity: Yes    Partners: Male    Birth control/ protection: Surgical

## 2017-02-05 ENCOUNTER — Other Ambulatory Visit: Payer: 59

## 2017-02-06 ENCOUNTER — Ambulatory Visit (INDEPENDENT_AMBULATORY_CARE_PROVIDER_SITE_OTHER): Payer: 59 | Admitting: Orthopaedic Surgery

## 2017-02-09 ENCOUNTER — Ambulatory Visit (INDEPENDENT_AMBULATORY_CARE_PROVIDER_SITE_OTHER): Payer: 59 | Admitting: Orthopaedic Surgery

## 2017-02-17 ENCOUNTER — Ambulatory Visit
Admission: RE | Admit: 2017-02-17 | Discharge: 2017-02-17 | Disposition: A | Discharge status: Home / Self Care | Payer: 59 | Source: Ambulatory Visit | Attending: Orthopaedic Surgery | Admitting: Orthopaedic Surgery

## 2017-02-17 DIAGNOSIS — G8929 Other chronic pain: Secondary | ICD-10-CM

## 2017-02-17 DIAGNOSIS — M25511 Pain in right shoulder: Principal | ICD-10-CM

## 2017-02-21 ENCOUNTER — Ambulatory Visit (INDEPENDENT_AMBULATORY_CARE_PROVIDER_SITE_OTHER): Payer: 59 | Admitting: Orthopaedic Surgery

## 2017-02-21 DIAGNOSIS — M19011 Primary osteoarthritis, right shoulder: Secondary | ICD-10-CM | POA: Diagnosis not present

## 2017-02-21 NOTE — Progress Notes (Signed)
   Office Visit Note   Patient: Hannah [name]           Date of Birth: 1960-12-12           MRN: [id number] Visit Date: 02/21/2017              Requested by: [name], MD [address] [place], [place] [address] PCP: [name], MD   Assessment & Plan: Visit Diagnoses:  1. Primary osteoarthritis, right shoulder     Plan: MRI shows advanced glenohumeral degenerative joint disease with rotator cuff tendinosis and acromioclavicular arthropathy. Overall her symptoms are more consistent with the arthritis of her shoulder joint. We ordered right shoulder injection with Dr. [name] which she has gotten good relief from in the past. Follow-up as needed. Total face to face encounter time was greater than 25 minutes and over half of this time was spent in counseling and/or coordination of care.  Follow-Up Instructions: Return if symptoms worsen or fail to improve.   Orders:  Orders Placed This Encounter  Procedures  . Ambulatory referral to Physical Medicine Rehab   No orders of the defined types were placed in this encounter.     Procedures: No procedures performed   Clinical Data: No additional findings.   Subjective: Chief Complaint  Patient presents with  . Right Shoulder - Pain, Follow-up    Patient's a 56 year old female who follows up today for her right shoulder MRI. Denies any changes in medical history.    Review of Systems   Objective: Vital Signs: There were no vitals taken for this visit.  Physical Exam  Ortho Exam Right shoulder exam shows pain and catching with crepitus with elevation. Rotator cuff strength is intact. Specialty Comments:  No specialty comments available.  Imaging: No results found.   PMFS History: Patient Active Problem List   Diagnosis Date Noted  . Menorrhagia 11/04/2016  . Fibroid tumor 11/04/2016  . Adenomyosis 11/04/2016  . Hypertension, essential 11/04/2016  . Primary osteoarthritis of right knee  07/26/2016  . 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 Allergy   . Anemia   . Arthritis   . Asthma   . Hypertension     Family History  Problem Relation Age of Onset  . Asthma Mother   . Colon cancer Neg Hx     Past Surgical History:  Procedure Laterality Date  . CHOLECYSTECTOMY  1993  . COLONOSCOPY    . 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 TOTAL KNEE ARTHROPLASTY Right 01/28/2016   Procedure: RIGHT TOTAL KNEE ARTHROPLASTY;  Surgeon: [name], MD;  Location: [facility];  Service: Orthopedics;  Laterality: Right;  . TRIGGER FINGER RELEASE Right 06/18/2014   Procedure: RIGHT LONG FINGER TRIGGER RELEASE;  Surgeon: [name], MD;  Location: [facility];  Service: Orthopedics;  Laterality: Right;  . TUBAL LIGATION    . VAGINAL HYSTERECTOMY Bilateral 11/04/2016   Procedure: HYSTERECTOMY VAGINAL with Bilateral Salpingectomy;  Surgeon: [name], MD;  Location: [facility];  Service: Gynecology;  Laterality: Bilateral;   Social History   Occupational History  . Not on file.   Social History Main Topics  . Smoking status: Never Smoker  . Smokeless tobacco: Never Used  . Alcohol use No  . Drug use: No  . Sexual activity: Yes    Partners: Male    Birth control/ protection: Surgical

## 2017-03-14 ENCOUNTER — Telehealth (INDEPENDENT_AMBULATORY_CARE_PROVIDER_SITE_OTHER): Payer: Self-pay | Admitting: Orthopaedic Surgery

## 2017-03-14 DIAGNOSIS — M19011 Primary osteoarthritis, right shoulder: Secondary | ICD-10-CM

## 2017-03-14 NOTE — Telephone Encounter (Signed)
yes

## 2017-03-14 NOTE — Telephone Encounter (Signed)
IC patient and advised referral entered for her to Dr Estanislado Pandy, [facility].

## 2017-03-14 NOTE — Telephone Encounter (Signed)
Patient called asked if Dr Erlinda Hong would refer her to Dr Estanislado Pandy. The number to contact patient is [phone]

## 2017-03-14 NOTE — Telephone Encounter (Signed)
Please advise on referral. thanks

## 2017-04-05 ENCOUNTER — Telehealth (INDEPENDENT_AMBULATORY_CARE_PROVIDER_SITE_OTHER): Payer: Self-pay | Admitting: Orthopaedic Surgery

## 2017-04-05 NOTE — Telephone Encounter (Signed)
Patient called wanting to speak with you about her FMLA papers. CB # [phone]

## 2017-04-14 ENCOUNTER — Encounter (INDEPENDENT_AMBULATORY_CARE_PROVIDER_SITE_OTHER): Payer: Self-pay | Admitting: Physical Medicine and Rehabilitation

## 2017-04-14 ENCOUNTER — Ambulatory Visit (INDEPENDENT_AMBULATORY_CARE_PROVIDER_SITE_OTHER): Payer: 59 | Admitting: Physical Medicine and Rehabilitation

## 2017-04-14 ENCOUNTER — Ambulatory Visit (INDEPENDENT_AMBULATORY_CARE_PROVIDER_SITE_OTHER): Payer: 59

## 2017-04-14 DIAGNOSIS — M25511 Pain in right shoulder: Secondary | ICD-10-CM | POA: Diagnosis not present

## 2017-04-14 DIAGNOSIS — G8929 Other chronic pain: Secondary | ICD-10-CM

## 2017-04-14 MED ORDER — TRIAMCINOLONE ACETONIDE 40 MG/ML IJ SUSP
80.0000 mg | INTRAMUSCULAR | Status: AC | PRN
  Administered 2017-04-14: 80 mg via INTRA_ARTICULAR

## 2017-04-14 MED ORDER — BUPIVACAINE HCL 0.5 % IJ SOLN
3.0000 mL | INTRAMUSCULAR | Status: AC | PRN
  Administered 2017-04-14: 3 mL via INTRA_ARTICULAR

## 2017-04-14 NOTE — Progress Notes (Signed)
Hannah [name] - 56 y.o. female MRN [id number]  Date of birth: 1960-11-27  Office Visit Note: Visit Date: 04/14/2017 PCP: [name], MD Referred by: [name], MD  Subjective: Chief Complaint  Patient presents with  . Right Shoulder - Pain   HPI: In March of this year and did extremely well for a few months.  We will repeat the injection today.    ROS Otherwise per HPI.  Assessment & Plan: Visit Diagnoses:  1. Chronic right shoulder pain     Plan: Findings:  Intra-articular glenohumeral joint injection with fluoroscopic guidance.  Initial flow of contrast showed more of a muscular pattern we did reposition with good flow of contrast in the joint.  Some relief during the anesthetic phase.    Meds & Orders: No orders of the defined types were placed in this encounter.   Orders Placed This Encounter  Procedures  . Large Joint Inj: R glenohumeral  . XR C-ARM NO REPORT    Follow-up: No Follow-up on file.   Procedures: Large Joint Inj: R glenohumeral on 04/14/2017 8:26 AM Indications: pain and diagnostic evaluation Details: 22 G 3.5 in needle, fluoroscopy-guided anteromedial approach  Arthrogram: No  Medications: 80 mg triamcinolone acetonide 40 MG/ML; 3 mL bupivacaine 0.5 % Outcome: tolerated well, no immediate complications  There was excellent flow of contrast producing a partial arthrogram of the glenohumeral joint. The patient did have relief of symptoms during the anesthetic phase of the injection. Procedure, treatment alternatives, risks and benefits explained, specific risks discussed. Consent was given by the patient. Immediately prior to procedure a time out was called to verify the correct patient, procedure, equipment, support staff and site/side marked as required. Patient was prepped and draped in the usual sterile fashion.      No notes on file   Clinical History: No specialty comments available.  She reports that  has never smoked.  she has never used smokeless tobacco. No results for input(s): HGBA1C, LABURIC in the last 8760 hours.  Objective:  VS:  HT:    WT:   BMI:     BP:   HR: bpm  TEMP: ( )  RESP:  Physical Exam  Ortho Exam Imaging: No results found.  Past Medical/Family/Surgical/Social History: Medications & Allergies reviewed per EMR Patient Active Problem List   Diagnosis Date Noted  . Menorrhagia 11/04/2016  . Fibroid tumor 11/04/2016  . Adenomyosis 11/04/2016  . Hypertension, essential 11/04/2016  . Primary osteoarthritis of right knee 07/26/2016  . 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 Allergy   . Anemia   . Arthritis   . Asthma   . Hypertension    Family History  Problem Relation Age of Onset  . Asthma Mother   . Colon cancer Neg Hx    Past Surgical History:  Procedure Laterality Date  . CHOLECYSTECTOMY  1993  . COLONOSCOPY    . 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 TOTAL KNEE ARTHROPLASTY Right 01/28/2016   Procedure: RIGHT TOTAL KNEE ARTHROPLASTY;  Surgeon: [name], MD;  Location: [facility];  Service: Orthopedics;  Laterality: Right;  . TRIGGER FINGER RELEASE Right 06/18/2014   Procedure: RIGHT LONG FINGER TRIGGER RELEASE;  Surgeon: [name], MD;  Location: [facility];  Service: Orthopedics;  Laterality: Right;  . TUBAL LIGATION    . VAGINAL HYSTERECTOMY Bilateral 11/04/2016   Procedure: HYSTERECTOMY VAGINAL with Bilateral Salpingectomy;  Surgeon: Kendall Flack  P, MD;  Location: [facility];  Service: Gynecology;  Laterality: Bilateral;   Social History   Occupational History  . Not on file  Tobacco Use  . Smoking status: Never Smoker  . Smokeless tobacco: Never Used  Substance and Sexual Activity  . Alcohol use: No    Alcohol/week: 0.0 oz  . Drug use: No  . Sexual activity: Yes    Partners: Male    Birth control/protection: Surgical

## 2017-04-14 NOTE — Progress Notes (Deleted)
Right shoulder pain. Worse with sleeping, lifting arm. Pain radiates to hand and forearm when typing.

## 2017-04-14 NOTE — Patient Instructions (Signed)

## 2017-04-18 ENCOUNTER — Ambulatory Visit (INDEPENDENT_AMBULATORY_CARE_PROVIDER_SITE_OTHER): Payer: 59

## 2017-04-18 ENCOUNTER — Ambulatory Visit (INDEPENDENT_AMBULATORY_CARE_PROVIDER_SITE_OTHER): Payer: 59 | Admitting: Orthopaedic Surgery

## 2017-04-18 ENCOUNTER — Encounter (INDEPENDENT_AMBULATORY_CARE_PROVIDER_SITE_OTHER): Payer: Self-pay | Admitting: Orthopaedic Surgery

## 2017-04-18 DIAGNOSIS — M1712 Unilateral primary osteoarthritis, left knee: Secondary | ICD-10-CM | POA: Diagnosis not present

## 2017-04-18 NOTE — Progress Notes (Signed)
Office Visit Note   Patient: Hannah [name]           Date of Birth: [birth year]           MRN: [id number] Visit Date: 04/18/2017              Requested by: [name], MD [address] [place], [place] [address] PCP: Osborne Casco Fransico Him, MD   Assessment & Plan: Visit Diagnoses:  1. Primary osteoarthritis of left knee     Plan: Impression is advanced left knee degenerative joint disease.  Patient has had very little relief from previous cortisone injections and wishes to try Monovisc injection instead.  Aleve currently gives her relief.  Referral to aquatic therapy with physical therapy was made today.  Questions encouraged and answered.  Follow-up for the Monovisc injection  Follow-Up Instructions: Return if symptoms worsen or fail to improve.   Orders:  Orders Placed This Encounter  Procedures  . XR KNEE 3 VIEW LEFT   No orders of the defined types were placed in this encounter.     Procedures: No procedures performed   Clinical Data: No additional findings.   Subjective: Chief Complaint  Patient presents with  . Left Knee - Pain    Patient comes in today for worsening left knee pain reminiscent of her right knee arthritis.  She reports constant pain with locking and giving way.  Denies any swelling or numbness and tingling or radiation of pain.    Review of Systems  Constitutional: Negative.   HENT: Negative.   Eyes: Negative.   Respiratory: Negative.   Cardiovascular: Negative.   Endocrine: Negative.   Musculoskeletal: Negative.   Neurological: Negative.   Hematological: Negative.   Psychiatric/Behavioral: Negative.   All other systems reviewed and are negative.    Objective: Vital Signs: There were no vitals taken for this visit.  Physical Exam  Constitutional: She is oriented to person, place, and time. She appears well-developed and well-nourished.  Pulmonary/Chest: Effort normal.  Neurological: She is alert and oriented to person,  place, and time.  Skin: Skin is warm. Capillary refill takes less than 2 seconds.  Psychiatric: She has a normal mood and affect. Her behavior is normal. Judgment and thought content normal.  Nursing note and vitals reviewed.   Ortho Exam Left knee exam shows no joint effusion.  Collaterals and cruciates are stable.  Normal range of motion.  Positive patellar crepitus. Specialty Comments:  No specialty comments available.  Imaging: Xr Knee 3 View Left  Result Date: 04/18/2017 Advanced degenerative joint disease of the left knee.  Stable right total knee replacement in good alignment    PMFS History: Patient Active Problem List   Diagnosis Date Noted  . Primary osteoarthritis of left knee 04/18/2017  . Menorrhagia 11/04/2016  . Fibroid tumor 11/04/2016  . Adenomyosis 11/04/2016  . Hypertension, essential 11/04/2016  . Primary osteoarthritis of right knee 07/26/2016  . 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 Allergy   . Anemia   . Arthritis   . Asthma   . Hypertension     Family History  Problem Relation Age of Onset  . Asthma Mother   . Colon cancer Neg Hx     Past Surgical History:  Procedure Laterality Date  . CHOLECYSTECTOMY  1993  . COLONOSCOPY    . HYSTERECTOMY VAGINAL with Bilateral Salpingectomy Bilateral 11/04/2016   Performed by [name], MD at [facility]  .  RIGHT LONG FINGER TRIGGER RELEASE Right 06/18/2014   Performed by [name], MD at [facility]  . RIGHT TOTAL KNEE ARTHROPLASTY Right 01/28/2016   Performed by [name], MD at [facility]  . 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 TUBAL LIGATION     Social History   Occupational History  . Not on file  Tobacco Use  . Smoking status: Never Smoker  . Smokeless tobacco: Never Used  Substance and Sexual Activity  . Alcohol use: No    Alcohol/week: 0.0 oz  . Drug use: No  . Sexual activity: Yes    Partners: Male     Birth control/protection: Surgical

## 2017-04-26 ENCOUNTER — Telehealth (INDEPENDENT_AMBULATORY_CARE_PROVIDER_SITE_OTHER): Payer: Self-pay

## 2017-04-26 ENCOUNTER — Telehealth (INDEPENDENT_AMBULATORY_CARE_PROVIDER_SITE_OTHER): Payer: Self-pay | Admitting: Orthopaedic Surgery

## 2017-04-26 NOTE — Telephone Encounter (Signed)
Returned call to patient left message to call back. 

## 2017-04-26 NOTE — Telephone Encounter (Signed)
Please see first message below regarding FMLA forms.

## 2017-04-26 NOTE — Telephone Encounter (Signed)
Tried to call patient.  Patient again no answer LMOM to return my call.   FYI ----I will be leaving today at 3:30 PM

## 2017-04-26 NOTE — Telephone Encounter (Signed)
Needs Appt for Monovisc Injection. Please schedule if she calls back.  ([employer] Bill  Left Monovisc inj)

## 2017-04-26 NOTE — Telephone Encounter (Signed)
Patient needs a letter stating why she missed deadline turning FMLA papers in (was not her fault). The deadline was 11/26 and she wasn't able to send them until 11/27. So until she gets a letter stating why she missed her deadline she is denied. Also, Kathlee Nations please give patient a call back concerning injection [phone].

## 2017-04-27 NOTE — Telephone Encounter (Signed)
I am following up on this for you.  I will let you know what I find out.

## 2017-04-28 ENCOUNTER — Encounter (INDEPENDENT_AMBULATORY_CARE_PROVIDER_SITE_OTHER): Payer: Self-pay | Admitting: Radiology

## 2017-04-28 NOTE — Telephone Encounter (Signed)
See message.

## 2017-04-28 NOTE — Telephone Encounter (Signed)
Patient called and wishes to hold off making an appt for the Monovisc injection until she knows for sure that the note is sent explaining why the FMLA papers were late on our behalf. She wants to make sure its covered before she receives it. She has a new deadline: 04/30/17. She would like a call back today concerning the note. CB# [phone]

## 2017-04-28 NOTE — Telephone Encounter (Signed)
Letter is now in chart, and I have emailed it to the patient as requested. IC patient and LMVM advising done.

## 2017-05-04 ENCOUNTER — Telehealth (INDEPENDENT_AMBULATORY_CARE_PROVIDER_SITE_OTHER): Payer: Self-pay | Admitting: Orthopaedic Surgery

## 2017-05-04 NOTE — Telephone Encounter (Signed)
Patient did not specify what she needed but she would like a call back Kathlee Nations! CB # [phone]

## 2017-05-04 NOTE — Telephone Encounter (Signed)
Tried to call patient no answer. 

## 2017-05-05 NOTE — Telephone Encounter (Signed)
Patient called back, you were away from your desk. Please call her when possible. # [phone]

## 2017-05-05 NOTE — Telephone Encounter (Signed)
Tried to call patient no answer LMOM to return call.  

## 2017-05-09 NOTE — Telephone Encounter (Signed)
IC patient and LMVM to call office.

## 2017-07-27 ENCOUNTER — Telehealth (INDEPENDENT_AMBULATORY_CARE_PROVIDER_SITE_OTHER): Payer: Self-pay | Admitting: Physical Medicine and Rehabilitation

## 2017-07-27 NOTE — Telephone Encounter (Signed)
Would run by Dr. Erlinda Hong as he has not seen her since November and follow up at that time was mainly about her knees. Obviously need to know how much/how long it helped

## 2017-07-27 NOTE — Telephone Encounter (Signed)
Left message asking patient to call back to let me know amount and duration of relief from last injection.

## 2017-08-15 NOTE — Telephone Encounter (Signed)
Closing call. Will await callback from patient.

## 2017-08-24 NOTE — Progress Notes (Signed)
Office Visit Note  Patient: Hannah [name]             Date of Birth: [birth year]           MRN: [id number]             PCP: [name], MD Referring: [name], MD Visit Date: 09/05/2017 Occupation: Customer service representative     Subjective:  Pain in multiple joints   History of Present Illness: Hannah [name] is a 57 y.o. female in consultation per request of her PCP.  According to patient she has had pain in her knee joints for the last 10 years.  She underwent right total knee replacement by Dr. Erlinda Hong in 2017 which is done quite well.  She had been having some discomfort in her left knee joint as well.  She states for the last 1-1/2-year she has been having discomfort in her right shoulder joint pain where she has difficulty raising her arm and sleeping on that side.  She also has difficulty dressing up.  She has received 2 cortisone injections by Dr. [name] which did not help much.  She states she was advised total shoulder replacement due to severe osteoarthritis.  She states over time she has experienced pain in multiple joints which she describes in her lower back, bilateral shoulders more in the right than the left, right elbow, and bilateral hands.  She states that she gets intermittent swelling in her hands especially in the morning.  She is also noticed decreased grip strength in her hands.  She also describes discomfort in her left knee joint and intermittent swelling in her right knee which is been replaced.  She denies any discomfort in her ankles and feet.  There is no history of rash.  There is no family history of psoriasis. She has taken Mobic in the past for joint pain but discontinued due to elevation in blood pressure.  She states she takes Aleve and Tylenol on as needed basis now. Activities of Daily Living:  Patient reports morning stiffness for 2 hours.   Patient Reports nocturnal pain.  Difficulty dressing/grooming: Denies Difficulty climbing stairs:  Reports Difficulty getting out of chair: Reports Difficulty using hands for taps, buttons, cutlery, and/or writing: Reports   Review of Systems  Constitutional: Positive for fatigue. Negative for night sweats, weight gain and weight loss.  HENT: Negative for mouth sores, trouble swallowing, trouble swallowing, mouth dryness and nose dryness.   Eyes: Negative for pain, redness, visual disturbance and dryness.  Respiratory: Negative for cough, shortness of breath and difficulty breathing.   Cardiovascular: Negative for chest pain, palpitations, hypertension, irregular heartbeat and swelling in legs/feet.  Gastrointestinal: Negative for blood in stool, constipation and diarrhea.  Endocrine: Negative for increased urination.  Genitourinary: Negative for vaginal dryness.  Musculoskeletal: Positive for arthralgias, joint pain, joint swelling and morning stiffness. Negative for myalgias, muscle weakness, muscle tenderness and myalgias.  Skin: Negative for color change, rash, hair loss, skin tightness, ulcers and sensitivity to sunlight.  Allergic/Immunologic: Negative for susceptible to infections.  Neurological: Negative for dizziness, memory loss, night sweats and weakness.  Hematological: Negative for swollen glands.  Psychiatric/Behavioral: Negative for depressed mood and sleep disturbance. The patient is nervous/anxious.     PMFS History:  Patient Active Problem List   Diagnosis Date Noted  . Primary osteoarthritis of left knee 04/18/2017  . Menorrhagia 11/04/2016  . Fibroid tumor 11/04/2016  . Adenomyosis 11/04/2016  . Hypertension, essential 11/04/2016  .  Primary osteoarthritis of right knee 07/26/2016  . Total knee replacement status 01/28/2016    Past Medical History:  Diagnosis Date  . Allergy   . Anemia   . Arthritis   . Asthma   . Hypertension     Family History  Problem Relation Age of Onset  . Cancer Mother        uterine   . Asthma Mother   . Arthritis Daughter     . Colon cancer Neg Hx    Past Surgical History:  Procedure Laterality Date  . CHOLECYSTECTOMY  1993  . COLONOSCOPY    . 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 TOTAL KNEE ARTHROPLASTY Right 01/28/2016   Procedure: RIGHT TOTAL KNEE ARTHROPLASTY;  Surgeon: [name], MD;  Location: [facility];  Service: Orthopedics;  Laterality: Right;  . TRIGGER FINGER RELEASE Right 06/18/2014   Procedure: RIGHT LONG FINGER TRIGGER RELEASE;  Surgeon: [name], MD;  Location: [facility];  Service: Orthopedics;  Laterality: Right;  . TUBAL LIGATION    . VAGINAL HYSTERECTOMY Bilateral 11/04/2016   Procedure: HYSTERECTOMY VAGINAL with Bilateral Salpingectomy;  Surgeon: [name], MD;  Location: [facility];  Service: Gynecology;  Laterality: Bilateral;   Social History   Social History Narrative  . Not on file     Objective: Vital Signs: BP 117/77 (BP Location: Left Arm, Patient Position: Sitting, Cuff Size: Large)   Pulse 86   Resp 16   Ht 5\' 6"  (1.676 m)   Wt 250 lb (113.4 kg)   LMP 10/22/2016 (Exact Date)   BMI 40.35 kg/m    Physical Exam  Constitutional: She is oriented to person, place, and time. She appears well-developed and well-nourished.  HENT:  Head: Normocephalic and atraumatic.  Eyes: Conjunctivae and EOM are normal.  Neck: Normal range of motion.  Cardiovascular: Normal rate, regular rhythm, normal heart sounds and intact distal pulses.  Pulmonary/Chest: Effort normal and breath sounds normal.  Abdominal: Soft. Bowel sounds are normal.  Lymphadenopathy:    She has no cervical adenopathy.  Neurological: She is alert and oriented to person, place, and time.  Skin: Skin is warm and dry. Capillary refill takes less than 2 seconds.  Psychiatric: She has a normal mood and affect. Her behavior is normal.  Nursing note and vitals reviewed.    Musculoskeletal Exam: C-spine thoracic lumbar spine good range of motion.  She has some discomfort with range of  motion of her lumbar spine.  Left shoulder joint full range of motion with discomfort.  Right shoulder joint abduction was limited to 90 degrees.  She has some tenderness over right lateral epicondyle area.  She has DIP and PIP thickening but no synovitis was noted in her hands.  Hip joints and limited painful range of motion.  Right total knee replacement is doing well with full extension.  She is some crepitus in her left knee joint without any warmth swelling or effusion.  She has osteoarthritic changes in her toes with postsurgical changes from prior left bunionectomy. CDAI Exam: No CDAI exam completed.    Investigation: Findings:  IMPRESSION: 1. Advanced glenohumeral joint degenerative changes as described above. 2. Degenerated and torn superior and posterior glenoid labrum. 3. Moderate rotator cuff tendinopathy/tendinosis. Articular surface tears but no full-thickness tear. 4. Tendinopathy involving the intra-articular portion of the long head biceps tendon. 5. Mild subacromial/subdeltoid bursitis.   Electronically Signed   By: Marijo Sanes M.D.   On: 02/18/2017 10:26  CBC Latest  Ref Rng & Units 11/05/2016 10/31/2016 01/29/2016  WBC 4.0 - 10.5 K/uL 15.5(H) 7.8 13.8(H)  Hemoglobin 12.0 - 15.0 g/dL 9.8(L) 10.9(L) 9.7(L)  Hematocrit 36.0 - 46.0 % 29.4(L) 32.9(L) 30.2(L)  Platelets 150 - 400 K/uL 238 250 238   CMP Latest Ref Rng & Units 10/31/2016 01/29/2016 01/15/2016  Glucose 65 - 99 mg/dL 106(H) 131(H) 94  BUN 6 - 20 mg/dL 14 9 12   Creatinine 0.44 - 1.00 mg/dL 0.90 0.88 0.85  Sodium 135 - 145 mmol/L 139 136 137  Potassium 3.5 - 5.1 mmol/L 3.9 3.9 3.8  Chloride 101 - 111 mmol/L 102 99(L) 104  CO2 22 - 32 mmol/L 29 27 27   Calcium 8.9 - 10.3 mg/dL 9.1 8.4(L) 8.5(L)  Total Protein 6.5 - 8.1 g/dL - - 7.7  Total Bilirubin 0.3 - 1.2 mg/dL - - 0.5  Alkaline Phos 38 - 126 U/L - - 71  AST 15 - 41 U/L - - 12(L)  ALT 14 - 54 U/L - - 10(L)    Imaging: Xr Hip Unilat W Or W/o Pelvis 2-3  Views Left  Result Date: 09/05/2017 Mild inferior medial joint space narrowing was noted.  No chondrocalcinosis was noted. Impression: These findings are consistent with mild osteoarthritis of the hip joint.  Xr Hip Unilat W Or W/o Pelvis 2-3 Views Right  Result Date: 09/05/2017 Mild inferior medial joint space narrowing was noted.  No chondrocalcinosis was noted. Impression: These findings are consistent with mild osteoarthritis of the hip joint.  Xr Hand 2 View Left  Result Date: 09/05/2017 All PIP and DIP joint space narrowing was noted.  CMC joint space narrowing was noted.  No MCP, intercarpal, radiocarpal changes were noted. Impression :These findings are consistent with osteoarthritis of the hand.  Xr Hand 2 View Right  Result Date: 09/05/2017 Right first MCP narrowing was noted.  CMC narrowing was noted.  DIP and PIP narrowing was noted.  None of the other MCPs were involved.  No intercarpal radiocarpal joint space narrowing was noted.  No erosive changes were noted. Impression: These findings are consistent with osteoarthritis of the hand.  Xr Shoulder Left  Result Date: 09/05/2017 No glenohumeral joint space narrowing was noted.  Inferior humeral spur was noted.  No acromioclavicular joint space narrowing was noted.  No chondrocalcinosis was noted. Impression- unremarkable x-ray of the left shoulder.   Speciality Comments: No specialty comments available.    Procedures:  No procedures performed Allergies: Sulfur   Assessment / Plan:     Visit Diagnoses: Primary osteoarthritis of right shoulder - MRI showed severe  loss of cartilage and rotator cuff tear  Chronic left shoulder pain - Plan: XR Shoulder Left.  The x-ray was unremarkable.  Pain in both hands -she has DIP and PIP thickening consistent with osteoarthritis.  No synovitis was noted.  Patient gives history of intermittent swelling and significant stiffness in the morning with decreased grip strength.  Plan: XR Hand 2  View Right, XR Hand 2 View Left, x-rays were consistent with osteoarthritis.  Uric acid, Rheumatoid factor, Cyclic citrul peptide antibody, IgG, Angiotensin converting enzyme  Chronic pain of both hips -she had discomfort range of motion of bilateral hip joints.  Plan: XR HIP UNILAT W OR W/O PELVIS 2-3 VIEWS RIGHT, XR HIP UNILAT W OR W/O PELVIS 2-3 VIEWS LEFT.  X-rays reveal mild osteoarthritic changes in the hip joints.  Primary osteoarthritis of left knee- She continues to have ongoing pain and discomfort in her left knee.  Status  post total right knee replacement - 2017 by Dr. Erlinda Hong  Primary osteoarthritis of both feet - Status post left bunionectomy, pes planus  Hypertension, essential-her blood pressure is well controlled currently.  History of asthma  Other fatigue -she gives history of significant fatigue for the last few months.  Plan: CBC with Differential/Platelet, CK, TSH, Sedimentation rate, Serum protein electrophoresis with reflex, COMPLETE METABOLIC PANEL WITH GFR    Orders: Orders Placed This Encounter  Procedures  . XR Hand 2 View Right  . XR Hand 2 View Left  . XR HIP UNILAT W OR W/O PELVIS 2-3 VIEWS RIGHT  . XR HIP UNILAT W OR W/O PELVIS 2-3 VIEWS LEFT  . XR Shoulder Left  . CBC with Differential/Platelet  . CK  . TSH  . Uric acid  . Sedimentation rate  . Rheumatoid factor  . Cyclic citrul peptide antibody, IgG  . Angiotensin converting enzyme  . Serum protein electrophoresis with reflex  . COMPLETE METABOLIC PANEL WITH GFR   No orders of the defined types were placed in this encounter.   Face-to-face time spent with patient was 50 minutes.  Greater than 50% of time was spent in counseling and coordination of care.  Follow-Up Instructions: Return for Osteoarthritis.   Bo Merino, MD  Note - This record has been created using Editor, commissioning.  Chart creation errors have been sought, but may not always  have been located. Such creation errors do not  reflect on  the standard of medical care.

## 2017-09-05 ENCOUNTER — Ambulatory Visit (INDEPENDENT_AMBULATORY_CARE_PROVIDER_SITE_OTHER): Payer: 59 | Admitting: Rheumatology

## 2017-09-05 ENCOUNTER — Ambulatory Visit (INDEPENDENT_AMBULATORY_CARE_PROVIDER_SITE_OTHER): Payer: Self-pay

## 2017-09-05 ENCOUNTER — Encounter: Payer: Self-pay | Admitting: Rheumatology

## 2017-09-05 VITALS — BP 117/77 | HR 86 | Resp 16 | Ht 66.0 in | Wt 250.0 lb

## 2017-09-05 DIAGNOSIS — M25512 Pain in left shoulder: Secondary | ICD-10-CM | POA: Diagnosis not present

## 2017-09-05 DIAGNOSIS — M79641 Pain in right hand: Secondary | ICD-10-CM

## 2017-09-05 DIAGNOSIS — M19072 Primary osteoarthritis, left ankle and foot: Secondary | ICD-10-CM

## 2017-09-05 DIAGNOSIS — M25552 Pain in left hip: Secondary | ICD-10-CM | POA: Diagnosis not present

## 2017-09-05 DIAGNOSIS — M1712 Unilateral primary osteoarthritis, left knee: Secondary | ICD-10-CM | POA: Diagnosis not present

## 2017-09-05 DIAGNOSIS — Z96651 Presence of right artificial knee joint: Secondary | ICD-10-CM | POA: Diagnosis not present

## 2017-09-05 DIAGNOSIS — M79642 Pain in left hand: Secondary | ICD-10-CM | POA: Diagnosis not present

## 2017-09-05 DIAGNOSIS — I10 Essential (primary) hypertension: Secondary | ICD-10-CM

## 2017-09-05 DIAGNOSIS — M25551 Pain in right hip: Secondary | ICD-10-CM

## 2017-09-05 DIAGNOSIS — M19071 Primary osteoarthritis, right ankle and foot: Secondary | ICD-10-CM

## 2017-09-05 DIAGNOSIS — R5383 Other fatigue: Secondary | ICD-10-CM | POA: Diagnosis not present

## 2017-09-05 DIAGNOSIS — Z8709 Personal history of other diseases of the respiratory system: Secondary | ICD-10-CM

## 2017-09-05 DIAGNOSIS — M19011 Primary osteoarthritis, right shoulder: Secondary | ICD-10-CM | POA: Diagnosis not present

## 2017-09-05 DIAGNOSIS — G8929 Other chronic pain: Secondary | ICD-10-CM | POA: Diagnosis not present

## 2017-09-06 NOTE — Progress Notes (Signed)
We will discuss labs at the follow-up visit.

## 2017-09-08 LAB — PROTEIN ELECTROPHORESIS, SERUM, WITH REFLEX
ABNORMAL PROTEIN BAND1: 1.4 g/dL — AB
ABNORMAL PROTEIN BAND2: 0.2 g/dL — AB
ALPHA 2: 0.7 g/dL (ref 0.5–0.9)
Albumin ELP: 3.4 g/dL — ABNORMAL LOW (ref 3.8–4.8)
Alpha 1: 0.3 g/dL (ref 0.2–0.3)
BETA 2: 1.5 g/dL — AB (ref 0.2–0.5)
BETA GLOBULIN: 0.4 g/dL (ref 0.4–0.6)
GAMMA GLOBULIN: 0.8 g/dL (ref 0.8–1.7)
Total Protein: 7.2 g/dL (ref 6.1–8.1)

## 2017-09-08 LAB — COMPLETE METABOLIC PANEL WITH GFR
AG Ratio: 1.2 (calc) (ref 1.0–2.5)
ALBUMIN MSPROF: 3.8 g/dL (ref 3.6–5.1)
ALT: 8 U/L (ref 6–29)
AST: 11 U/L (ref 10–35)
Alkaline phosphatase (APISO): 74 U/L (ref 33–130)
BUN: 17 mg/dL (ref 7–25)
CO2: 32 mmol/L (ref 20–32)
CREATININE: 0.85 mg/dL (ref 0.50–1.05)
Calcium: 8.6 mg/dL (ref 8.6–10.4)
Chloride: 100 mmol/L (ref 98–110)
GFR, EST AFRICAN AMERICAN: 90 mL/min/{1.73_m2} (ref >=60)
GFR, Est Non African American: 78 mL/min/{1.73_m2} (ref >=60)
GLUCOSE: 94 mg/dL (ref 65–99)
Globulin: 3.1 g/dL (calc) (ref 1.9–3.7)
Potassium: 3.5 mmol/L (ref 3.5–5.3)
Sodium: 139 mmol/L (ref 135–146)
TOTAL PROTEIN: 6.9 g/dL (ref 6.1–8.1)
Total Bilirubin: 0.2 mg/dL (ref 0.2–1.2)

## 2017-09-08 LAB — CBC WITH DIFFERENTIAL/PLATELET
BASOS ABS: 26 {cells}/uL (ref 0–200)
BASOS PCT: 0.3 %
EOS PCT: 0.8 %
Eosinophils Absolute: 70 cells/uL (ref 15–500)
HEMATOCRIT: 33.9 % — AB (ref 35.0–45.0)
HEMOGLOBIN: 11.2 g/dL — AB (ref 11.7–15.5)
LYMPHS ABS: 2059 {cells}/uL (ref 850–3900)
MCH: 23.3 pg — ABNORMAL LOW (ref 27.0–33.0)
MCHC: 33 g/dL (ref 32.0–36.0)
MCV: 70.5 fL — ABNORMAL LOW (ref 80.0–100.0)
MPV: 10.8 fL (ref 7.5–12.5)
Monocytes Relative: 4.9 %
NEUTROS ABS: 6213 {cells}/uL (ref 1500–7800)
Neutrophils Relative %: 70.6 %
Platelets: 244 10*3/uL (ref 140–400)
RBC: 4.81 10*6/uL (ref 3.80–5.10)
RDW: 16.5 % — ABNORMAL HIGH (ref 11.0–15.0)
Total Lymphocyte: 23.4 %
WBC mixed population: 431 cells/uL (ref 200–950)
WBC: 8.8 10*3/uL (ref 3.8–10.8)

## 2017-09-08 LAB — IFE INTERPRETATION: IMMUNOFIX ELECTR INT: DETECTED

## 2017-09-08 LAB — CYCLIC CITRUL PEPTIDE ANTIBODY, IGG: Cyclic Citrullin Peptide Ab: <16 UNITS

## 2017-09-08 LAB — CK: Total CK: 86 U/L (ref 29–143)

## 2017-09-08 LAB — TSH: TSH: 1.14 mIU/L

## 2017-09-08 LAB — SEDIMENTATION RATE: SED RATE: 53 mm/h — AB (ref 0–30)

## 2017-09-08 LAB — RHEUMATOID FACTOR: Rhuematoid fact SerPl-aCnc: <14 IU/mL (ref <14)

## 2017-09-08 LAB — ANGIOTENSIN CONVERTING ENZYME: Angiotensin-Converting Enzyme: 35 U/L (ref 9–67)

## 2017-09-08 LAB — URIC ACID: URIC ACID, SERUM: 7.2 mg/dL — AB (ref 2.5–7.0)

## 2017-09-26 ENCOUNTER — Telehealth: Payer: Self-pay | Admitting: Rheumatology

## 2017-09-26 DIAGNOSIS — Z8709 Personal history of other diseases of the respiratory system: Secondary | ICD-10-CM | POA: Insufficient documentation

## 2017-09-26 DIAGNOSIS — Z96651 Presence of right artificial knee joint: Secondary | ICD-10-CM | POA: Insufficient documentation

## 2017-09-26 DIAGNOSIS — M19072 Primary osteoarthritis, left ankle and foot: Secondary | ICD-10-CM

## 2017-09-26 DIAGNOSIS — M19041 Primary osteoarthritis, right hand: Secondary | ICD-10-CM | POA: Insufficient documentation

## 2017-09-26 DIAGNOSIS — M19042 Primary osteoarthritis, left hand: Secondary | ICD-10-CM | POA: Insufficient documentation

## 2017-09-26 DIAGNOSIS — M19011 Primary osteoarthritis, right shoulder: Secondary | ICD-10-CM | POA: Insufficient documentation

## 2017-09-26 DIAGNOSIS — M160 Bilateral primary osteoarthritis of hip: Secondary | ICD-10-CM | POA: Insufficient documentation

## 2017-09-26 DIAGNOSIS — M19071 Primary osteoarthritis, right ankle and foot: Secondary | ICD-10-CM | POA: Insufficient documentation

## 2017-09-26 NOTE — Progress Notes (Signed)
 Office Visit Note  Patient: Hannah [name]             Date of Birth: 11/24/1960           MRN: [id number]             PCP: Tisovec, Richard W, MD Referring: Tisovec, Richard W, MD Visit Date: 10/05/2017 Occupation: @GUAROCC@    Subjective:  Pain in multiple joints.   History of Present Illness: Hannah [name] is a 56 y.o. female with history of osteoarthritis.  She continues to have pain and discomfort in her bilateral hands, bilateral shoulders, bilateral knee joints and her lower back.  She states she has difficulty walking because of that.  She feels tightness and swelling in her hands especially in the morning.  She also feels some swelling in her right knee joint.  Patient reports her pain level on scale of 0-10 about 8.  Activities of Daily Living:  Patient reports morning stiffness for 2 hours.   Patient Reports nocturnal pain.  Difficulty dressing/grooming: Denies Difficulty climbing stairs: Reports Difficulty getting out of chair: Reports Difficulty using hands for taps, buttons, cutlery, and/or writing: Reports   Review of Systems  Constitutional: Positive for fatigue. Negative for night sweats, weight gain and weight loss.  HENT: Negative for mouth sores, trouble swallowing, trouble swallowing, mouth dryness and nose dryness.   Eyes: Negative for pain, redness, visual disturbance and dryness.  Respiratory: Negative for cough, shortness of breath and difficulty breathing.   Cardiovascular: Negative for chest pain, palpitations, hypertension, irregular heartbeat and swelling in legs/feet.  Gastrointestinal: Negative for blood in stool, constipation and diarrhea.  Endocrine: Negative for increased urination.  Genitourinary: Negative for vaginal dryness.  Musculoskeletal: Positive for arthralgias, joint pain, joint swelling, myalgias, morning stiffness and myalgias. Negative for muscle weakness and muscle tenderness.  Skin: Negative for color change, rash, hair loss,  skin tightness, ulcers and sensitivity to sunlight.  Allergic/Immunologic: Negative for susceptible to infections.  Neurological: Negative for dizziness, memory loss, night sweats and weakness.  Hematological: Negative for swollen glands.  Psychiatric/Behavioral: Positive for depressed mood and sleep disturbance. The patient is nervous/anxious.     PMFS History:  Patient Active Problem List   Diagnosis Date Noted  . Primary osteoarthritis of both hands 09/26/2017  . Primary osteoarthritis of right shoulder 09/26/2017  . Primary osteoarthritis of both hips 09/26/2017  . Status post total knee replacement, right 09/26/2017  . Primary osteoarthritis of both feet 09/26/2017  . History of asthma 09/26/2017  . Primary osteoarthritis of left knee 04/18/2017  . Menorrhagia 11/04/2016  . Fibroid tumor 11/04/2016  . Adenomyosis 11/04/2016  . Hypertension, essential 11/04/2016  . Total knee replacement status 01/28/2016    Past Medical History:  Diagnosis Date  . Allergy   . Anemia   . Anxiety   . Arthritis   . Asthma   . Depression   . Hypertension     Family History  Problem Relation Age of Onset  . Cancer Mother        uterine   . Asthma Mother   . Arthritis Daughter   . Depression Maternal Uncle   . Colon cancer Neg Hx    Past Surgical History:  Procedure Laterality Date  . CHOLECYSTECTOMY  1993  . COLONOSCOPY    . 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 TOTAL KNEE ARTHROPLASTY Right 01/28/2016   Procedure: RIGHT TOTAL KNEE ARTHROPLASTY;  Surgeon: Naiping M Xu, MD;  Location: [facility];    Service: Orthopedics;  Laterality: Right;  . TRIGGER FINGER RELEASE Right 06/18/2014   Procedure: RIGHT LONG FINGER TRIGGER RELEASE;  Surgeon: [name], MD;  Location: [facility];  Service: Orthopedics;  Laterality: Right;  . TUBAL LIGATION    . VAGINAL HYSTERECTOMY Bilateral 11/04/2016   Procedure: HYSTERECTOMY VAGINAL with Bilateral Salpingectomy;  Surgeon:  [name], MD;  Location: [facility];  Service: Gynecology;  Laterality: Bilateral;   Social History   Social History Narrative  . Not on file     Objective: Vital Signs: BP 124/76 (BP Location: Left Arm, Patient Position: Sitting, Cuff Size: Large)   Pulse 72   Resp 17   Ht 5' 6" (1.676 m)   Wt 246 lb 8 oz (111.8 kg)   LMP 10/22/2016 (Exact Date)   BMI 39.79 kg/m    Physical Exam  Constitutional: She is oriented to person, place, and time. She appears well-developed and well-nourished.  HENT:  Head: Normocephalic and atraumatic.  Eyes: Conjunctivae and EOM are normal.  Neck: Normal range of motion.  Cardiovascular: Normal rate, regular rhythm, normal heart sounds and intact distal pulses.  Pulmonary/Chest: Effort normal and breath sounds normal.  Abdominal: Soft. Bowel sounds are normal.  Lymphadenopathy:    She has no cervical adenopathy.  Neurological: She is alert and oriented to person, place, and time.  Skin: Skin is warm and dry. Capillary refill takes less than 2 seconds.  Psychiatric: She has a normal mood and affect. Her behavior is normal.  Nursing note and vitals reviewed.    Musculoskeletal Exam: C-spine thoracic lumbar spine good range of motion.  Shoulder joints painful range of motion bilaterally.  Elbow joints were in good range of motion.  She has good range of motion of her wrist joint and MCPs.  She has DIP PIP thickening bilaterally with no synovitis.  Hip joints were in good range of motion.  Her right total knee replacement has some warmth and discomfort with range of motion.  Left knee joint has some crepitus but no warmth or swelling.  She has some osteoarthritic changes in her feet.  No synovitis was noted on examination.  She has some generalized hyperalgesia.  CDAI Exam: No CDAI exam completed.    Investigation: No additional findings. 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  Speciality Comments: No specialty comments available.    Procedures:  No procedures performed Allergies: Sulfur   Assessment / Plan:     Visit Diagnoses: Primary osteoarthritis of both hands-patient continues to have some stiffness and discomfort in her hands.  Her clinical findings are consistent with osteoarthritis.  She has no synovitis on examination.  I informed patient if she has recurrence of swelling she should notify us and we can schedule ultrasound of her bilateral hands.  Primary osteoarthritis of right shoulder - MRI consistent with severe osteoarthritis and rotator cuff tear.  She has chronic pain.  Primary osteoarthritis of both hips - mild.  She has good range of motion in her bilateral hips.  Primary osteoarthritis of left knee-she has ongoing discomfort in her knee joints.  Weight loss diet and exercise was discussed.  Patient has not started the water therapy yet.  Status post total knee replacement, right - 2017 by Dr. Xu, she still have some discomfort.  Primary osteoarthritis of both feet-proper fitting shoes were discussed.  Hypertension, essential-her blood pressure is under control.  History of asthma  Abnormal laboratory test - Positive IFE and elevated ESR .  She was referred to hematology.   Orders: No orders of the defined types were placed in this encounter.  No orders of the defined types were placed in this encounter.   Face-to-face time spent with patient was 30 minutes. .>50% of time was spent in counseling and coordination of care.  Follow-Up Instructions: Return if symptoms worsen or fail to improve, for Osteoarthritis.   Shaili  Deveshwar, MD  Note - This record has been created using Dragon software.  Chart creation errors have been sought, but may not always  have been located. Such creation errors do not reflect on  the standard of medical care. 

## 2017-09-26 NOTE — Telephone Encounter (Signed)
Patient left a voicemail requesting the results from her bloodwork on 09/05/17.

## 2017-09-26 NOTE — Telephone Encounter (Signed)
Left message to advise patient lab results will be discussed at new patient follow up visit.

## 2017-10-03 ENCOUNTER — Other Ambulatory Visit (HOSPITAL_COMMUNITY): Payer: PRIVATE HEALTH INSURANCE | Attending: Psychiatry | Admitting: Psychiatry

## 2017-10-03 ENCOUNTER — Encounter (HOSPITAL_COMMUNITY): Payer: Self-pay | Admitting: Psychiatry

## 2017-10-03 DIAGNOSIS — Z8261 Family history of arthritis: Secondary | ICD-10-CM | POA: Insufficient documentation

## 2017-10-03 DIAGNOSIS — Z9889 Other specified postprocedural states: Secondary | ICD-10-CM | POA: Diagnosis not present

## 2017-10-03 DIAGNOSIS — Z90710 Acquired absence of both cervix and uterus: Secondary | ICD-10-CM | POA: Insufficient documentation

## 2017-10-03 DIAGNOSIS — Z825 Family history of asthma and other chronic lower respiratory diseases: Secondary | ICD-10-CM | POA: Diagnosis not present

## 2017-10-03 DIAGNOSIS — Z791 Long term (current) use of non-steroidal anti-inflammatories (NSAID): Secondary | ICD-10-CM | POA: Diagnosis not present

## 2017-10-03 DIAGNOSIS — Z882 Allergy status to sulfonamides status: Secondary | ICD-10-CM | POA: Diagnosis not present

## 2017-10-03 DIAGNOSIS — Z808 Family history of malignant neoplasm of other organs or systems: Secondary | ICD-10-CM | POA: Diagnosis not present

## 2017-10-03 DIAGNOSIS — F320 Major depressive disorder, single episode, mild: Secondary | ICD-10-CM | POA: Insufficient documentation

## 2017-10-03 DIAGNOSIS — Z96651 Presence of right artificial knee joint: Secondary | ICD-10-CM | POA: Insufficient documentation

## 2017-10-03 DIAGNOSIS — Z79891 Long term (current) use of opiate analgesic: Secondary | ICD-10-CM | POA: Insufficient documentation

## 2017-10-03 DIAGNOSIS — Z79899 Other long term (current) drug therapy: Secondary | ICD-10-CM | POA: Insufficient documentation

## 2017-10-03 DIAGNOSIS — R451 Restlessness and agitation: Secondary | ICD-10-CM | POA: Diagnosis not present

## 2017-10-03 DIAGNOSIS — Z9049 Acquired absence of other specified parts of digestive tract: Secondary | ICD-10-CM | POA: Insufficient documentation

## 2017-10-03 NOTE — Progress Notes (Signed)
    Daily Group Progress Note  Program: IOP  Group Time: 9:00-12:00  Participation Level: Active  Behavioral Response: Appropriate  Type of Therapy:  Group Therapy  Summary of Progress: Pt. Presented initially with flat affect, depressed. Pt.'s affect brightened significantly by the end of group while talking in grief and loss group. Pt. Participated in grief and loss group with the Chaplain. Pt. Commented that she was working through issues related to job stress, parenting, and several family relationships. Pt. Commented that she appreciated the term "reset" to describe starting the IOP program.      Nancie Neas, LPC

## 2017-10-03 NOTE — Progress Notes (Signed)
Comprehensive Clinical Assessment (CCA) Note  10/03/2017 Hannah [name] [id number]  Visit Diagnosis:      ICD-10-CM   1. Current mild episode of major depressive disorder without prior episode ([place]) F32.0       CCA Part One  Part One has been completed on paper by the patient.  (See scanned document in Chart Review)  CCA Part Two A  Intake/Chief Complaint:  CCA Intake With Chief Complaint CCA Part Two Date: 10/03/17 CCA Part Two Time: 7616 Chief Complaint/Presenting Problem: This is a 57 yr old, married, employed, [place] American female; who was referred per PCP (Dr. Osborne Casco); treatment for worsening depressive symptoms with anxiety.  Admits to passive SI.  Pt denies a plan or intent/means.  Discussed safety options with pt.  Pt able to contract for safety.  Denies any prior attempts or gestures.  Admits to seeing a therapist in the past; denies seeing a psychiatrist in the past.  Denies inpt hospitalizations.  Stressors:  1)  Grief/Loss Issues:  Two yrs ago mother passed d/t uterine cancer.  Father resides two hrs away and pt assists him when needed.  2)  Job ([employer]) of sixteen yrs.  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  Reports being written up due to forgetting to say things on the phone to customers.  Plans to explore other job options.  3)  46 yr old son:  Pt states she worries about him.  He is ADD. "I think he's on the Autism Spectrum."  Pt plans to have him tested.  77 yr old son is also ADD and resides at the home.  ETOH issues; third DUI.  Family hx:  Maternal Uncle (Depression)                                                          Patients Currently Reported Symptoms/Problems: Sadness, tearfulness, poor concentrating, agitated, ruminating thoughts, anxious, poor sleep (awakenings), no energy, anhedonia, isolative, flucuating appetite, passive SI Collateral Involvement: Reports her church, family and friends are supportive. Individual's  Strengths: Pt is motivated for treatment. Type of Services Patient Feels Are Needed: MH-IOP  Mental Health Symptoms Depression:  Depression: Change in energy/activity, Difficulty Concentrating, Increase/decrease in appetite, Irritability, Sleep (too much or little), Tearfulness  Mania:  Mania: N/A  Anxiety:   Anxiety: Worrying  Psychosis:  Psychosis: N/A  Trauma:  Trauma: N/A  Obsessions:  Obsessions: N/A  Compulsions:  Compulsions: N/A  Inattention:  Inattention: N/A  Hyperactivity/Impulsivity:  Hyperactivity/Impulsivity: N/A  Oppositional/Defiant Behaviors:  Oppositional/Defiant Behaviors: N/A  Borderline Personality:  Emotional Irregularity: N/A  Other Mood/Personality Symptoms:      Mental Status Exam Appearance and self-care  Stature:  Stature: Average  Weight:  Weight: Average weight  Clothing:  Clothing: Casual  Grooming:  Grooming: Normal  Cosmetic use:  Cosmetic Use: None  Posture/gait:  Posture/Gait: Normal  Motor activity:  Motor Activity: Not Remarkable  Sensorium  Attention:  Attention: Normal  Concentration:  Concentration: Normal  Orientation:  Orientation: X5  Recall/memory:  Recall/Memory: Normal  Affect and Mood  Affect:  Affect: Blunted  Mood:  Mood: Depressed  Relating  Eye contact:  Eye Contact: Normal  Facial expression:  Facial Expression: Sad  Attitude toward examiner:  Attitude Toward Examiner: Cooperative  Thought and Language  Speech flow: Speech Flow: Normal  Thought content:  Thought Content: Appropriate to mood and circumstances  Preoccupation:     Hallucinations:     Organization:     Transport planner of Knowledge:  Fund of Knowledge: Average  Intelligence:  Intelligence: Average  Abstraction:  Abstraction: Normal  Judgement:  Judgement: Normal  Reality Testing:  Reality Testing: Adequate  Insight:  Insight: Good  Decision Making:  Decision Making: Normal  Social Functioning  Social Maturity:  Social Maturity: Isolates   Social Judgement:  Social Judgement: Normal  Stress  Stressors:  Stressors: Family conflict, Grief/losses, Work  Coping Ability:  Coping Ability: English as a second language teacher Deficits:     Supports:      Family and Psychosocial History: Family history Marital status: Married Number of Years Married: 76 What types of issues is patient dealing with in the relationship?: states husband is supportive Does patient have children?: Yes How many children?: 2 How is patient's relationship with their children?: 38 yr old and 1 yr old (both in the home)  Childhood History:  Childhood History By whom was/is the patient raised?: Both parents Additional childhood history information: Born in [place].  States childhood was good.  "I was a mouthy child, so my mom kept me busy in activities."  Denies trauma or abuse. Description of patient's relationship with caregiver when they were a child: "Good" Patient's description of current relationship with people who raised him/her: Close to father. Does patient have siblings?: Yes Number of Siblings: 2 Description of patient's current relationship with siblings: Two younger sisters. Did patient suffer any verbal/emotional/physical/sexual abuse as a child?: No Did patient suffer from severe childhood neglect?: No Has patient ever been sexually abused/assaulted/raped as an adolescent or adult?: No Was the patient ever a victim of a crime or a disaster?: No Witnessed domestic violence?: No Has patient been effected by domestic violence as an adult?: No  CCA Part Two B  Employment/Work Situation: Employment / Work Copywriter, advertising Employment situation: Employed Where is patient currently employed?: [employer] How long has patient been employed?: 16 yrs Patient's job has been impacted by current illness: Yes Describe how patient's job has been impacted: Decreased functioning at work; taking work home to complete Has patient 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) Has patient ever served in combat?: No Did You Receive Any Psychiatric Treatment/Services While in Passenger transport manager?: No Are There Guns or Other Weapons in [place]?: No  Education: Education Did Teacher, adult education From [employer]?: Yes Did Physicist, medical?: Yes What Type of College Degree Do you Have?: Business ED in IT Did [place]?: No Did You Have An Individualized Education Program (IIEP): No Did You Have Any Difficulty At [employer]?: (States she's dx with ADD)  Religion: Religion/Spirituality Are You A Religious Person?: Yes What is Your Religious Affiliation?: Pentecostal  Leisure/Recreation: Leisure / Recreation Leisure and Hobbies: Shopping at [employer]; performing arts with kids at church  Exercise/Diet: Exercise/Diet Do You Exercise?: No Have You Gained or Lost A Significant Amount of Weight in the Past Six Months?: No Do You Follow a Special Diet?: No Do You Have Any Trouble Sleeping?: Yes Explanation of Sleeping Difficulties: awakenings  CCA Part Two C  Alcohol/Drug Use: Alcohol / Drug Use Pain Medications: CC:  MAR Prescriptions: cc:  MAR Over the Counter: cc:  MAR History of alcohol / drug use?: No history of alcohol / drug abuse  CCA Part Three  ASAM's:  Six Dimensions of Multidimensional Assessment  Dimension 1:  Acute Intoxication and/or Withdrawal Potential:     Dimension 2:  Biomedical Conditions and Complications:     Dimension 3:  Emotional, Behavioral, or Cognitive Conditions and Complications:     Dimension 4:  Readiness to Change:     Dimension 5:  Relapse, Continued use, or Continued Problem Potential:     Dimension 6:  Recovery/Living Environment:      Substance use Disorder (SUD)    Social Function:  Social Functioning Social Maturity: Isolates Social Judgement: Normal  Stress:  Stress Stressors: Family conflict, Grief/losses, Work Coping Ability:  Overwhelmed Patient Takes Medications The Way The Doctor Instructed?: Yes Priority Risk: Moderate Risk  Risk Assessment- Self-Harm Potential: Risk Assessment For Self-Harm Potential Thoughts of Self-Harm: Vague current thoughts Availability of Means: No access/NA Additional Comments for Self-Harm Potential: able to contract for safety.  Risk Assessment -Dangerous to Others Potential: Risk Assessment For Dangerous to Others Potential Method: No Plan Availability of Means: No access or NA Intent: Vague intent or NA Notification Required: No need or identified person  DSM5 Diagnoses: Patient Active Problem List   Diagnosis Date Noted  . Primary osteoarthritis of both hands 09/26/2017  . Primary osteoarthritis of right shoulder 09/26/2017  . Primary osteoarthritis of both hips 09/26/2017  . Status post total knee replacement, right 09/26/2017  . Primary osteoarthritis of both feet 09/26/2017  . History of asthma 09/26/2017  . Primary osteoarthritis of left knee 04/18/2017  . Menorrhagia 11/04/2016  . Fibroid tumor 11/04/2016  . Adenomyosis 11/04/2016  . Hypertension, essential 11/04/2016  . Total knee replacement status 01/28/2016    Patient Centered Plan: Patient is on the following Treatment Plan(s):  Anxiety and Depression  Recommendations for Services/Supports/Treatments: Recommendations for Services/Supports/Treatments Recommendations For Services/Supports/Treatments: IOP (Intensive Outpatient Program)  Treatment Plan Summary:  Oriented pt to MH-IOP.  Provided pt with an orientation folder.  Encouraged support groups.  Will refer pt to a therapist.  F/U with Dr. Osborne Casco.  Referrals to Alternative Service(s): Referred to Alternative Service(s):   Place:   Date:   Time:    Referred to Alternative Service(s):   Place:   Date:   Time:    Referred to Alternative Service(s):   Place:   Date:   Time:    Referred to Alternative Service(s):   Place:   Date:   Time:      Dellia Nims, M.Ed,CNA

## 2017-10-03 NOTE — Progress Notes (Signed)
Psychiatric Initial Adult Assessment   Patient Identification: Hannah [name] MRN:  [id number] Date of Evaluation:  10/03/2017 Referral Source:  Chief Complaint:   Visit Diagnosis:    ICD-10-CM   1. 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  Patient reports worsening depression. States stress related to employment and multiple family stressors.  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 Reports she feels pressure perform current job duties and has not been able to keep up with tasks.  States slight resentment with upper management due to her supervisors have been younger than her.  Hata continues to ruminate with the passing of her mother. Reports her mother passed away 2 years ago from cervical cancer and states she continues grief and mourning her mother's death. Reports a fairly good relationship with her father however states her father relocated to [facility] Idy reports she is  married with 2 children in the household, 2 y.o son who she reports  may have a learning disability, she is currently seeking for developmental screenings for autism and psychological testing.  Reports ongoing stress and worry with regards to her 33 year old son who recently got his third ( DUI ) driving while intoxicated.   Reports she is noticed that her mood has changed and she is easily angered and agitated.  Reports difficulty with concentration, hopelessness and depression.  Denies previous suicidal ideations or attempts.  However patient is expressing passive thoughts of " not wanting to be here" denies plan or intent.  Reports she was recently started on Zoloft however has not picked up medications as of yet. States she dosnt  like taking medications. Patient was encouraged to take medications.  Support, encouragement and reassurance was provided.     Associated Signs/Symptoms: Depression Symptoms:  depressed mood, difficulty concentrating, hopelessness, (Hypo) Manic Symptoms:  Distractibility, Irritable Mood, Anxiety Symptoms:  Excessive Worry, Psychotic Symptoms:  Hallucinations: None PTSD Symptoms: NA  Past Psychiatric History:   Previous Psychotropic Medications:   Substance Abuse History in the last 12 months:  No.  Consequences of Substance Abuse: NA  Past Medical History:  Past Medical History:  Diagnosis Date  . Allergy   . Anemia   . Arthritis   . Asthma   . Hypertension     Past Surgical History:  Procedure Laterality Date  . CHOLECYSTECTOMY  1993  . COLONOSCOPY    . 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 TOTAL KNEE ARTHROPLASTY Right 01/28/2016   Procedure: RIGHT TOTAL KNEE ARTHROPLASTY;  Surgeon: [name], MD;  Location: [facility];  Service: Orthopedics;  Laterality: Right;  . TRIGGER FINGER RELEASE Right 06/18/2014   Procedure: RIGHT LONG FINGER TRIGGER RELEASE;  Surgeon: [name], MD;  Location: [facility];  Service: Orthopedics;  Laterality: Right;  . TUBAL LIGATION    . VAGINAL HYSTERECTOMY Bilateral 11/04/2016   Procedure: HYSTERECTOMY VAGINAL with Bilateral Salpingectomy;  Surgeon: [name], MD;  Location: [facility];  Service: Gynecology;  Laterality: Bilateral;    Family Psychiatric History:   Family History:  Family History  Problem Relation Age of Onset  . Cancer Mother        uterine   . Asthma Mother   . Arthritis Daughter   . Colon cancer Neg Hx  Social History:   Social History   Socioeconomic History  . Marital status: Married    Spouse name: Not on file  . Number of children: Not on file  . Years of education: Not on file  . Highest education level: Not on file  Occupational History  . Not on file  Social Needs  . Financial resource strain: Not on file  . Food insecurity:     Worry: Not on file    Inability: Not on file  . Transportation needs:    Medical: Not on file    Non-medical: Not on file  Tobacco Use  . Smoking status: Never Smoker  . Smokeless tobacco: Never Used  Substance and Sexual Activity  . Alcohol use: No    Alcohol/week: 0.0 oz  . Drug use: No  . Sexual activity: Yes    Partners: Male    Birth control/protection: Surgical  Lifestyle  . Physical activity:    Days per week: Not on file    Minutes per session: Not on file  . Stress: Not on file  Relationships  . Social connections:    Talks on phone: Not on file    Gets together: Not on file    Attends religious service: Not on file    Active member of club or organization: Not on file    Attends meetings of clubs or organizations: Not on file    Relationship status: Not on file  Other Topics Concern  . Not on file  Social History Narrative  . Not on file    Additional Social History:   Allergies:   Allergies  Allergen Reactions  . Sulfur Swelling    Facial swelling     Metabolic Disorder Labs: No results found for: HGBA1C, MPG No results found for: PROLACTIN No results found for: CHOL, TRIG, HDL, CHOLHDL, VLDL, LDLCALC   Current Medications: Current Outpatient Medications  Medication Sig Dispense Refill  . B Complex-C (SUPER B COMPLEX PO) Take 1 tablet by mouth daily.     . budesonide-formoterol (SYMBICORT) 160-4.5 MCG/ACT inhaler Inhale 2 puffs into the lungs daily as needed.    . chlorthalidone (HYGROTON) 25 MG tablet TAKE 1 TABLET BY MOUTH EVERY DAY FOR HTN  3  . docusate sodium (COLACE) 100 MG capsule Take 1 capsule (100 mg total) by mouth 2 (two) times daily. (Patient not taking: Reported on 01/23/2017) 10 capsule 0  . ferrous sulfate 325 (65 FE) MG tablet Take 325 mg by mouth 2 (two) times daily with a meal.    . hydrochlorothiazide (HYDRODIURIL) 12.5 MG tablet Take 12.5 mg by mouth daily.    Marland Kitchen ibuprofen (ADVIL,MOTRIN) 600 MG tablet 600 MG BY MOUTH EVERY 6  HOURS FOR 5 DAYS, THEN EVERY 6 HOURS AS NEEDED FOR PAIN.  DON'T TAKE MOBIC WHILE TAKING IBUPROFEN (Patient not taking: Reported on 09/05/2017) 60 tablet 0  . Multiple Vitamin (MULTIVITAMIN) tablet Take 1 tablet by mouth daily.    . naproxen sodium (ALEVE) 220 MG tablet Take 220 mg by mouth as needed.    . ondansetron (ZOFRAN) 4 MG tablet Take 1 tablet (4 mg total) by mouth every 8 (eight) hours as needed for nausea or vomiting. (Patient not taking: Reported on 09/05/2017) 20 tablet 0  . oxyCODONE-acetaminophen (PERCOCET/ROXICET) 5-325 MG tablet Take 1-2 tablets by mouth every 4 (four) hours as needed for severe pain (moderate to severe pain (when tolerating fluids)). (Patient not taking: Reported on 09/05/2017) 30 tablet 0  . Vitamin D, Ergocalciferol, (  DRISDOL) 50000 units CAPS capsule Take 50,000 Units by mouth every Monday.      No current facility-administered medications for this visit.     Neurologic: Headache: No Seizure: No Paresthesias:No  Musculoskeletal: Strength & Muscle Tone: within normal limits Gait & Station: normal Patient leans: N/A  Psychiatric Specialty Exam: Review of Systems  Psychiatric/Behavioral: Positive for depression. Negative for suicidal ideas (passive ). The patient is nervous/anxious and has insomnia.   All other systems reviewed and are negative.   Last menstrual period 10/22/2016.There is no height or weight on file to calculate BMI.  General Appearance: Casual  Eye Contact:  Fair  Speech:  Clear and Coherent  Volume:  Normal  Mood:  Anxious and Depressed  Affect:  Congruent  Thought Process:  Coherent  Orientation:  Full (Time, Place, and Person)  Thought Content:  Hallucinations: None and Rumination  Suicidal Thoughts:  No  Homicidal Thoughts:  No  Memory:  Immediate;   Fair Recent;   Fair Remote;   Fair  Judgement:  Fair  Insight:  Present  Psychomotor Activity:  Normal  Concentration:  Concentration: Fair  Recall:  [employer] of  [place]: Fair  Akathisia:  No  Handed:  Right  AIMS (if indicated):    Assets:  Communication Skills Desire for Improvement Resilience Social Support  ADL's:  Intact  Cognition: WNL  Sleep:      Treatment Plan Summary: Admit to IOP ( intensive outpatient )  Consider Grief therapy session as discharge  Start Zoloft 50 mg PO QD as prescribed by PCP   Treatment plan was reviewed and agreed upon by NP T.Sailor Hevia and patient Nezzie Manera need for group session  Derrill Center, NP 5/7/20194:01 PM

## 2017-10-04 ENCOUNTER — Other Ambulatory Visit (HOSPITAL_COMMUNITY): Payer: PRIVATE HEALTH INSURANCE | Admitting: Psychiatry

## 2017-10-04 DIAGNOSIS — F320 Major depressive disorder, single episode, mild: Secondary | ICD-10-CM

## 2017-10-05 ENCOUNTER — Ambulatory Visit (INDEPENDENT_AMBULATORY_CARE_PROVIDER_SITE_OTHER): Payer: 59 | Admitting: Rheumatology

## 2017-10-05 ENCOUNTER — Encounter: Payer: Self-pay | Admitting: Rheumatology

## 2017-10-05 VITALS — BP 124/76 | HR 72 | Resp 17 | Ht 66.0 in | Wt 246.5 lb

## 2017-10-05 DIAGNOSIS — M19071 Primary osteoarthritis, right ankle and foot: Secondary | ICD-10-CM

## 2017-10-05 DIAGNOSIS — M19072 Primary osteoarthritis, left ankle and foot: Secondary | ICD-10-CM

## 2017-10-05 DIAGNOSIS — R899 Unspecified abnormal finding in specimens from other organs, systems and tissues: Secondary | ICD-10-CM

## 2017-10-05 DIAGNOSIS — M19042 Primary osteoarthritis, left hand: Secondary | ICD-10-CM

## 2017-10-05 DIAGNOSIS — M19041 Primary osteoarthritis, right hand: Secondary | ICD-10-CM

## 2017-10-05 DIAGNOSIS — Z96651 Presence of right artificial knee joint: Secondary | ICD-10-CM

## 2017-10-05 DIAGNOSIS — M160 Bilateral primary osteoarthritis of hip: Secondary | ICD-10-CM

## 2017-10-05 DIAGNOSIS — M1712 Unilateral primary osteoarthritis, left knee: Secondary | ICD-10-CM

## 2017-10-05 DIAGNOSIS — M19011 Primary osteoarthritis, right shoulder: Secondary | ICD-10-CM

## 2017-10-05 DIAGNOSIS — Z8709 Personal history of other diseases of the respiratory system: Secondary | ICD-10-CM

## 2017-10-05 DIAGNOSIS — I10 Essential (primary) hypertension: Secondary | ICD-10-CM

## 2017-10-05 NOTE — Progress Notes (Signed)
    Daily Group Progress Note  Program: IOP  Group Time: 9:00-12:00  Participation Level: Active  Behavioral Response: Appropriate  Type of Therapy:  Group Therapy  Summary of Progress: Pt. Presented as talkative, alert, engaged in the group process. Pt. Provided thoughtful feedback to other patients that they found helpful related to her experience of being a caregiver for her mother. Pt. Discussed that it had been very difficult to ask for help from her siblings and her mother did not want outside help in the home. Pt. Participated in discussion about recognizing barriers to self-care, how she will address and remind herself to practice self-care, identifying negative coping strategies (I.e., isolating), and what I will do instead (I.e., reaching out to my support system, journaling, breathing exercises).       Nancie Neas, LPC

## 2017-10-06 ENCOUNTER — Other Ambulatory Visit (HOSPITAL_COMMUNITY): Payer: PRIVATE HEALTH INSURANCE | Admitting: Psychiatry

## 2017-10-06 DIAGNOSIS — F320 Major depressive disorder, single episode, mild: Secondary | ICD-10-CM

## 2017-10-06 NOTE — Progress Notes (Signed)
    Daily Group Progress Note  Program: IOP  Group Time: 9:00-12:00  Participation Level: Active  Behavioral Response: Appropriate  Type of Therapy:  Group Therapy  Summary of Progress: Pt. Presented with bright affect, talkative, engaged in the group process. Pt. Discussed her recent problems with emotional regulation and fears about not being able to control her anger. Pt. Received feedback from the group about learning to recognize the thoughts that she has about situations that trigger her to have strong emotions. Pt. Discussed thoughts about her husband and children and reviewed cognitive modeling worksheet to help identify her problematic thoughts and develop new thoughts that will allow her feelings to be more manageable. Pt. Dicussed that her self-care has been inconsistent except for sleeping which has been excessive and contributed to her depression. Pt. Discussed her love for movies and books and spending time with her family.     Nancie Neas, LPC

## 2017-10-09 ENCOUNTER — Other Ambulatory Visit (HOSPITAL_COMMUNITY): Payer: PRIVATE HEALTH INSURANCE | Admitting: Psychiatry

## 2017-10-09 DIAGNOSIS — F320 Major depressive disorder, single episode, mild: Secondary | ICD-10-CM | POA: Diagnosis not present

## 2017-10-09 NOTE — Progress Notes (Signed)
    Daily Group Progress Note  Program: IOP  Group Time: 9:00-12:00  Participation Level: Active  Behavioral Response: Appropriate  Type of Therapy:  Group Therapy  Summary of Progress: Pt. Presented as alert,  talkative, engaged in the therapeutic process pt. Shared that she was feeling "Ok". Pt. Reported to the group that she was able to "focus on me" over the weekend referring to pattern of caregiving and overextending herself with family obligations. Pt. Participated in group discussion about developing healthy boundaries, developing consistent self-care, and the importance of assertive communication in relationships.      Nancie Neas, LPC

## 2017-10-10 ENCOUNTER — Other Ambulatory Visit (HOSPITAL_COMMUNITY): Payer: PRIVATE HEALTH INSURANCE

## 2017-10-11 ENCOUNTER — Other Ambulatory Visit (HOSPITAL_COMMUNITY): Payer: PRIVATE HEALTH INSURANCE | Admitting: Psychiatry

## 2017-10-11 DIAGNOSIS — F320 Major depressive disorder, single episode, mild: Secondary | ICD-10-CM

## 2017-10-12 ENCOUNTER — Telehealth: Payer: Self-pay | Admitting: Nurse Practitioner

## 2017-10-12 ENCOUNTER — Encounter: Payer: Self-pay | Admitting: Nurse Practitioner

## 2017-10-12 ENCOUNTER — Other Ambulatory Visit (HOSPITAL_COMMUNITY): Payer: PRIVATE HEALTH INSURANCE | Admitting: Psychiatry

## 2017-10-12 DIAGNOSIS — F320 Major depressive disorder, single episode, mild: Secondary | ICD-10-CM

## 2017-10-12 NOTE — Telephone Encounter (Signed)
Received a referral from Dr. Estanislado Pandy for a dx of abnormal IFE. Tc has been made to the pt to see Regan Rakers Burton/Dr. Burr Medico on 5/28 at 130pm. Pt aware to arrive 30 minutes early. Letter with the appt information mailed to the pt.

## 2017-10-13 ENCOUNTER — Other Ambulatory Visit (HOSPITAL_COMMUNITY): Payer: PRIVATE HEALTH INSURANCE | Admitting: Psychiatry

## 2017-10-13 DIAGNOSIS — F320 Major depressive disorder, single episode, mild: Secondary | ICD-10-CM | POA: Diagnosis not present

## 2017-10-13 NOTE — Progress Notes (Signed)
    Daily Group Progress Note  Program: IOP  Group Time: 9:00-12:00  Participation Level: Active  Behavioral Response: Appropriate  Type of Therapy:  Group Therapy  Summary of Progress: Pt. Presented as talkative, engaged in group process. Pt. Discussed challenge of school system and parenting children. Pt. Discussed that she copes by making engaged connections with her son and volunteering in his school. Pt. Offered helpful advice to another group member about writing down short term goals with objectives to avoid getting overwhelmed about the future. Pt. Participated in guided meditation breathwork exercise.     Nancie Neas, LPC

## 2017-10-16 ENCOUNTER — Other Ambulatory Visit (HOSPITAL_COMMUNITY): Payer: PRIVATE HEALTH INSURANCE | Admitting: Psychiatry

## 2017-10-16 DIAGNOSIS — F320 Major depressive disorder, single episode, mild: Secondary | ICD-10-CM | POA: Diagnosis not present

## 2017-10-16 NOTE — Progress Notes (Signed)
    Daily Group Progress Note  Program: IOP  Group Time: 9:00-12:00  Participation Level: Active  Behavioral Response: Appropriate  Type of Therapy:  Group Therapy   Summary of Progress: Pt. Presented as talkative, engaged in the group process. Pt. Discussed generally work stress and feeling overwhelmed by changes that have occurred in her work place. Pt. Discussed that in the past she has not made a daily commitment to her self-care and recognized the need to rediscover what that would mean for her. Pt. Participated in discussion facilitated by Wellness Director Frederich Balding about developing a wellness plan.     Nancie Neas, LPC

## 2017-10-16 NOTE — Progress Notes (Signed)
    Daily Group Progress Note  Program: IOP  Group Time: 9:00-12:00  Participation Level: Active  Behavioral Response: Appropriate  Type of Therapy:  Group Therapy  Summary of Progress: Pt. Presents as alert, talkative, engaged in the group process. Pt. Discussed her history of becoming overwhelmed by work, family, and volunteering at her son's schools. Pt. Is learning to set effective boundaries and prioritize her self-care in order to better manage her stress. Pt. Participated in discussion about taking small steps to develop motivation during periods of depression and discussion about identifying the salient parts of self that are not dependent on roles.    Nancie Neas, LPC

## 2017-10-17 ENCOUNTER — Other Ambulatory Visit (HOSPITAL_COMMUNITY): Payer: PRIVATE HEALTH INSURANCE | Admitting: Psychiatry

## 2017-10-17 DIAGNOSIS — F320 Major depressive disorder, single episode, mild: Secondary | ICD-10-CM | POA: Diagnosis not present

## 2017-10-17 NOTE — Progress Notes (Signed)
    Daily Group Progress Note  Program: IOP  Group Time: 9:00-12:00  Participation Level: Active  Behavioral Response: Appropriate  Type of Therapy:  Group Therapy  Summary of Progress:  Pt. Presents as talkative, alert, brightened affect, engaged in the group process. Pt. Discussed the challenges of raising a teenage son, challenges of managing a healthy communication with her spouse. Pt. Briefly discussed her job related stress and concerns about returning to work. Pt. Participated in discussion about use of the grounding series, meditation, and breathing to manage anxiety, depression, and stress.      Nancie Neas, LPC

## 2017-10-17 NOTE — Progress Notes (Signed)
Hannah [name] is a 57 y.o. , married, employed, [place] American female; who was referred per PCP (Dr. Osborne Casco); treatment for worsening depressive symptoms with anxiety.  Admits to passive SI.  Pt denies a plan or intent/means.  Discussed safety options with pt.  Pt able to contract for safety.  Denies any prior attempts or gestures.  Admits to seeing a therapist in the past; denies seeing a psychiatrist in the past.  Denies inpt hospitalizations.  Stressors:  1)  Grief/Loss Issues:  Two yrs ago mother passed d/t uterine cancer.  Father resides two hrs away and pt assists him when needed.  2)  Job ([employer]) of sixteen yrs.  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  Reports being written up due to forgetting to say things on the phone to customers.  3)  3 yr old son:  Pt states she worries about him.  He is ADD. "I think he's on the Autism Spectrum."  Pt plans to have him tested.  27 yr old son is also ADD and resides at the home.  ETOH issues; third DUI. Fam JJ:HERDEYCXKGYJE([id number])                                                    Patients Currently Reported Symptoms/Problems: Sadness, tearfulness, poor concentrating, agitated, ruminating thoughts, anxious, poor sleep (awakenings), no energy, anhedonia, isolative, flucuating appetite, passive SI. Pt has been attending [facility].  Pt is very active in all the groups. A: Pt will be discharged on 10-26-17. Recommending that patient doesn't return to work until 11-06-17; this will give her a chance to see her new therapist at least 1-2 times along with her PCP. R:  Pt receptive.         Carlis Abbott, RITA, M.Ed, CNA

## 2017-10-17 NOTE — Progress Notes (Signed)
    Daily Group Progress Note  Program: IOP  Group Time: 9:00-12:00  Participation Level: Active  Behavioral Response: Appropriate  Type of Therapy:  Group Therapy  Summary of Progress: Pt. Presents as talkative, engaged in the group process. Pt. Shared her experience of being a single parent to her older children, parenting challenges related to raising her teenage son, and the importance of learning to exercise daily forgiveness of self in order to better manage depression. Pt. Participated in medication management group with the pharmacist. Pt. Participated in discussion about "Just for Today" present focused awareness and mindfulness.     Hannah [name], LPC

## 2017-10-18 ENCOUNTER — Other Ambulatory Visit (HOSPITAL_COMMUNITY): Payer: PRIVATE HEALTH INSURANCE | Admitting: Psychiatry

## 2017-10-18 DIAGNOSIS — F320 Major depressive disorder, single episode, mild: Secondary | ICD-10-CM | POA: Diagnosis not present

## 2017-10-18 NOTE — Progress Notes (Signed)
    Daily Group Progress Note  Program: IOP  Group Time: 9:00-12:00  Participation Level: Active  Behavioral Response: Appropriate  Type of Therapy:  Group Therapy  Summary of Progress:  Pt. Presented as talkative, alert, engaged in the group process. Pt. Discussed her relationship with her son and appreciating his ability to help her become more self-aware through open communication. Pt. Participated in reflective reading about developing compassion for others while honoring our feelings. Pt. Participated in discussion about the use of self-compassion- mindfulness, connection to other, and kindness to self.    Nancie Neas, LPC

## 2017-10-19 ENCOUNTER — Other Ambulatory Visit (HOSPITAL_COMMUNITY): Payer: PRIVATE HEALTH INSURANCE | Admitting: Psychiatry

## 2017-10-19 DIAGNOSIS — F320 Major depressive disorder, single episode, mild: Secondary | ICD-10-CM | POA: Diagnosis not present

## 2017-10-20 ENCOUNTER — Other Ambulatory Visit (HOSPITAL_COMMUNITY): Payer: PRIVATE HEALTH INSURANCE | Admitting: Psychiatry

## 2017-10-20 DIAGNOSIS — F320 Major depressive disorder, single episode, mild: Secondary | ICD-10-CM
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
## 2017-10-23 ENCOUNTER — Ambulatory Visit (HOSPITAL_COMMUNITY): Payer: Self-pay | Admitting: Psychiatry

## 2017-10-24 ENCOUNTER — Inpatient Hospital Stay: Payer: 59

## 2017-10-24 ENCOUNTER — Telehealth: Payer: Self-pay | Admitting: Hematology

## 2017-10-24 ENCOUNTER — Encounter: Payer: Self-pay | Admitting: Nurse Practitioner

## 2017-10-24 ENCOUNTER — Inpatient Hospital Stay: Payer: 59 | Attending: Nurse Practitioner | Admitting: Nurse Practitioner

## 2017-10-24 ENCOUNTER — Other Ambulatory Visit (HOSPITAL_COMMUNITY): Payer: PRIVATE HEALTH INSURANCE | Admitting: Psychiatry

## 2017-10-24 VITALS — BP 136/89 | HR 80 | Temp 98.6°F | Resp 18 | Ht 66.0 in | Wt 243.2 lb

## 2017-10-24 DIAGNOSIS — D649 Anemia, unspecified: Secondary | ICD-10-CM

## 2017-10-24 DIAGNOSIS — D472 Monoclonal gammopathy: Secondary | ICD-10-CM | POA: Diagnosis not present

## 2017-10-24 DIAGNOSIS — D509 Iron deficiency anemia, unspecified: Secondary | ICD-10-CM | POA: Diagnosis not present

## 2017-10-24 DIAGNOSIS — M1990 Unspecified osteoarthritis, unspecified site: Secondary | ICD-10-CM

## 2017-10-24 DIAGNOSIS — I10 Essential (primary) hypertension: Secondary | ICD-10-CM | POA: Insufficient documentation

## 2017-10-24 DIAGNOSIS — F320 Major depressive disorder, single episode, mild: Secondary | ICD-10-CM

## 2017-10-24 DIAGNOSIS — E876 Hypokalemia: Secondary | ICD-10-CM

## 2017-10-24 LAB — CBC WITH DIFFERENTIAL (CANCER CENTER ONLY)
BASOS PCT: 0 %
Basophils Absolute: 0 10*3/uL (ref 0.0–0.1)
EOS ABS: 0.1 10*3/uL (ref 0.0–0.5)
Eosinophils Relative: 1 %
HEMATOCRIT: 33.6 % — AB (ref 34.8–46.6)
HEMOGLOBIN: 11.1 g/dL — AB (ref 11.6–15.9)
LYMPHS ABS: 2.3 10*3/uL (ref 0.9–3.3)
Lymphocytes Relative: 28 %
MCH: 23.5 pg — ABNORMAL LOW (ref 25.1–34.0)
MCHC: 33 g/dL (ref 31.5–36.0)
MCV: 71.2 fL — ABNORMAL LOW (ref 79.5–101.0)
Monocytes Absolute: 0.4 10*3/uL (ref 0.1–0.9)
Monocytes Relative: 4 %
NEUTROS ABS: 5.6 10*3/uL (ref 1.5–6.5)
NEUTROS PCT: 67 %
Platelet Count: 244 10*3/uL (ref 145–400)
RBC: 4.72 MIL/uL (ref 3.70–5.45)
RDW: 16.5 % — ABNORMAL HIGH (ref 11.2–14.5)
WBC: 8.4 10*3/uL (ref 3.9–10.3)

## 2017-10-24 LAB — CMP (CANCER CENTER ONLY)
ALT: 13 U/L (ref 0–55)
ANION GAP: 12 — AB (ref 3–11)
AST: 16 U/L (ref 5–34)
Albumin: 3.6 g/dL (ref 3.5–5.0)
Alkaline Phosphatase: 76 U/L (ref 40–150)
BUN: 11 mg/dL (ref 7–26)
CALCIUM: 8.9 mg/dL (ref 8.4–10.4)
CHLORIDE: 99 mmol/L (ref 98–109)
CO2: 27 mmol/L (ref 22–29)
CREATININE: 0.81 mg/dL (ref 0.60–1.10)
Glucose, Bld: 101 mg/dL (ref 70–140)
Potassium: 3 mmol/L — CL (ref 3.5–5.1)
SODIUM: 138 mmol/L (ref 136–145)
Total Bilirubin: 0.4 mg/dL (ref 0.2–1.2)
Total Protein: 8 g/dL (ref 6.4–8.3)

## 2017-10-24 MED ORDER — POTASSIUM CHLORIDE CRYS ER 20 MEQ PO TBCR: 20.0000 meq | EXTENDED_RELEASE_TABLET | Freq: Every day | ORAL | 0 refills | Status: DC

## 2017-10-24 NOTE — Telephone Encounter (Signed)
Gave patient avs report and appointments for August.  °

## 2017-10-24 NOTE — Progress Notes (Addendum)
[facility]  [phone] Fax:([phone]) [phone]  Clinic New consult Note   Patient Care Team: Tisovec, Fransico Him, MD as PCP - General (Internal Medicine) 10/24/2017  CHIEF COMPLAINTS/PURPOSE OF CONSULTATION:  Abnormal IFE  REFERRED BY: Dr. Estanislado Pandy, rheumatology   HISTORY OF PRESENTING ILLNESS:  Hannah [name] 57 y.o. female is here because of abnormal blood work. She was referred by her rheumatologist, Dr. Estanislado Pandy. She presents alone for today's consult. She was found to have abnormal CBC on 01/15/2016 with mild anemia Hgb 10.6, low MCV 71.7, and elevated RDW 16.4%. Her anemia appears chronic; underwent on 11/16/2016 per GYN for menorrhagia.  She has taken iron sporadically for a few months. She was referred to rheumatology on 09/05/17 for evaluation and work up for existing bone/joint pain. She has imaging findings consisted with OA. She has relatively new pelvic and lower back pain within the last 6 months. OA pain is controlled with aleve and extra strength tylenol PRN. She reported to rheumatology few months history of fatigue, which she attributed to recent diagnosis of anxiety and depression, which prompted further lab work. She illness or injury. Sed rate elevated to 53, SPEP with two abnormal protein bands detected, one in the gamma globulins and one in the beta globulins. 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   Past medical history includes anemia on iron therapy, OA of knees, right shoulder, bilateral hands, and bilateral hips, HTN, asthma, depression, and anxiety. She is stable on zoloft and seeing mental health counselor. S/p hysterectomy for menorrhagia in 10/2016 per Dr. Leo Grosser and total right knee arthroplasty on 12/2015 per Dr. Erlinda Hong. She lives in her home, independent of all ADLs. She works as a Secondary school teacher. Family history is positive for mother with uterine cancer. No prior personal history of cancer; she is up to date on routine  cancer screenings such as PAP, mammogram, and colonoscopy.   Today she reports few months history of fatigue, she is still able to work and function. Has 28-monthhistory of night sweats which she attributes to menopause. No decreased appetite, fever, chills, or abnormal unexplained weight loss. No recent bleeding, cough, chest pain, dyspnea, n/v/c/d. Mood is controlled. Sleep is fair. Joint pain is controlled with NSAIDs and tylenol PRN. Denies neuropathy or skin rash.    MEDICAL HISTORY:  Past Medical History:  Diagnosis Date  . Allergy   . Anemia   . Anxiety   . Arthritis   . Asthma   . Depression   . Hypertension     SURGICAL HISTORY: Past Surgical History:  Procedure Laterality Date  . CHOLECYSTECTOMY  1993  . COLONOSCOPY    . 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 TOTAL KNEE ARTHROPLASTY Right 01/28/2016   Procedure: RIGHT TOTAL KNEE ARTHROPLASTY;  Surgeon: NLeandrew Koyanagi MD;  [facility]  Service: Orthopedics;  Laterality: Right;  . TRIGGER FINGER RELEASE Right 06/18/2014   Procedure: RIGHT LONG FINGER TRIGGER RELEASE;  Surgeon: NMarianna Payment MD;  Location[facility]  Service: Orthopedics;  Laterality: Right;  . TUBAL LIGATION    . VAGINAL HYSTERECTOMY Bilateral 11/04/2016   Procedure: HYSTERECTOMY VAGINAL with Bilateral Salpingectomy;  Surgeon: HEldred Manges MD;  [facility];  Service: Gynecology;  Laterality: Bilateral;    SOCIAL HISTORY: Social History   Socioeconomic History  . Marital status: Married    Spouse name: Not on file  . Number of children: Not on file  . Years of  education: Not on file  . Highest education level: Not on file  Occupational History  . Not on file  Social Needs  . Financial resource strain: Not on file  . Food insecurity:    Worry: Not on file    Inability: Not on file  . Transportation needs:    Medical: Not on file    Non-medical: Not on file  Tobacco Use  . Smoking status: Never Smoker    . Smokeless tobacco: Never Used  Substance and Sexual Activity  . Alcohol use: No    Alcohol/week: 0.0 oz  . Drug use: No  . Sexual activity: Yes    Partners: Male    Birth control/protection: Surgical  Lifestyle  . Physical activity:    Days per week: Not on file    Minutes per session: Not on file  . Stress: Not on file  Relationships  . Social connections:    Talks on phone: Not on file    Gets together: Not on file    Attends religious service: Not on file    Active member of club or organization: Not on file    Attends meetings of clubs or organizations: Not on file    Relationship status: Not on file  . Intimate partner violence:    Fear of current or ex partner: Not on file    Emotionally abused: Not on file    Physically abused: Not on file    Forced sexual activity: Not on file  Other Topics Concern  . Not on file  Social History Narrative  . Not on file    FAMILY HISTORY: Family History  Problem Relation Age of Onset  . Cancer Mother        uterine   . Asthma Mother   . Arthritis Daughter   . Depression Maternal Uncle   . Colon cancer Neg Hx     ALLERGIES:  is allergic to sulfur.  MEDICATIONS:  Current Outpatient Medications  Medication Sig Dispense Refill  . B Complex-C (SUPER B COMPLEX PO) Take 1 tablet by mouth daily.     . budesonide-formoterol (SYMBICORT) 160-4.5 MCG/ACT inhaler Inhale 2 puffs into the lungs daily as needed.    . chlorthalidone (HYGROTON) 25 MG tablet TAKE 1 TABLET BY MOUTH EVERY DAY FOR HTN  3  . ferrous sulfate 325 (65 FE) MG tablet Take 325 mg by mouth 2 (two) times daily with a meal.    . ibuprofen (ADVIL,MOTRIN) 600 MG tablet 600 MG BY MOUTH EVERY 6 HOURS FOR 5 DAYS, THEN EVERY 6 HOURS AS NEEDED FOR PAIN.  DON'T TAKE MOBIC WHILE TAKING IBUPROFEN 60 tablet 0  . Multiple Vitamin (MULTIVITAMIN) tablet Take 1 tablet by mouth daily.    . naproxen sodium (ALEVE) 220 MG tablet Take 220 mg by mouth as needed.    . sertraline  (ZOLOFT) 50 MG tablet Take 25 mg by mouth at bedtime.    . Vitamin D, Ergocalciferol, (DRISDOL) 50000 units CAPS capsule Take 50,000 Units by mouth every Monday.     . docusate sodium (COLACE) 100 MG capsule Take 1 capsule (100 mg total) by mouth 2 (two) times daily. (Patient not taking: Reported on 10/24/2017) 10 capsule 0  . hydrochlorothiazide (HYDRODIURIL) 12.5 MG tablet Take 12.5 mg by mouth daily.    . ondansetron (ZOFRAN) 4 MG tablet Take 1 tablet (4 mg total) by mouth every 8 (eight) hours as needed for nausea or vomiting. (Patient not taking: Reported on 10/05/2017) 20 tablet  0  . oxyCODONE-acetaminophen (PERCOCET/ROXICET) 5-325 MG tablet Take 1-2 tablets by mouth every 4 (four) hours as needed for severe pain (moderate to severe pain (when tolerating fluids)). (Patient not taking: Reported on 10/05/2017) 30 tablet 0  . potassium chloride SA (K-DUR,KLOR-CON) 20 MEQ tablet Take 1 tablet (20 mEq total) by mouth daily. Take 1 tab twice daily for 3 days then take 1 tab daily 33 tablet 0   No current facility-administered medications for this visit.     REVIEW OF SYSTEMS:   Constitutional: Denies fevers, chills, weight loss (+) night sweats x6 months (+) few months h/o fatigue  Eyes: Denies blurriness of vision, double vision or watery eyes Ears, nose, mouth, throat, and face: Denies mucositis or sore throat Respiratory: Denies cough, dyspnea or wheezes Cardiovascular: Denies palpitation, chest discomfort or lower extremity swelling Gastrointestinal:  Denies nausea, vomiting, constipation, diarrhea, hematochezia, heartburn or change in bowel habits Skin: Denies abnormal skin rashes Lymphatics: Denies new lymphadenopathy or easy bruising Neurological:Denies numbness, tingling or new weaknesses Behavioral/Psych: Mood is stable, no new changes (+) anxiety (+) depression, stable on zoloft under care of [place] provider   MSK: (+) OA related chronic joint pain R shoulder, hips, hands (+) 6 month h/o low  back pain  All other systems were reviewed with the patient and are negative.  PHYSICAL EXAMINATION: ECOG PERFORMANCE STATUS: 1 - Symptomatic but completely ambulatory  Vitals:   10/24/17 1332  BP: 136/89  Pulse: 80  Resp: 18  Temp: 98.6 F (37 C)  SpO2: 96%   Filed Weights   10/24/17 1332  Weight: 243 lb 3.2 oz (110.3 kg)    GENERAL:alert, no distress and comfortable SKIN: no rashes or significant lesions EYES: normal, conjunctiva are pink and non-injected, sclera clear OROPHARYNX:no thrush or ulcers   LYMPH:  no palpable cervical, supraclavicular, or axillary lymphadenopathy LUNGS: clear to auscultation with normal breathing effort HEART: regular rate & rhythm and no murmurs and no lower extremity edema ABDOMEN:abdomen soft, non-tender and normal bowel sounds Musculoskeletal:no cyanosis of digits and no clubbing  PSYCH: alert & oriented x 3 with fluent speech NEURO: no focal motor/sensory deficits  LABORATORY DATA:  I have reviewed the data as listed CBC Latest Ref Rng & Units 10/24/2017 09/05/2017 11/05/2016  WBC 3.9 - 10.3 K/uL 8.4 8.8 15.5(H)  Hemoglobin 11.6 - 15.9 g/dL 11.1(L) 11.2(L) 9.8(L)  Hematocrit 34.8 - 46.6 % 33.6(L) 33.9(L) 29.4(L)  Platelets 145 - 400 K/uL 244 244 238    CMP Latest Ref Rng & Units 10/24/2017 09/05/2017 09/05/2017  Glucose 70 - 140 mg/dL 101 94 -  BUN 7 - 26 mg/dL 11 17 -  Creatinine 0.60 - 1.10 mg/dL 0.81 0.85 -  Sodium 136 - 145 mmol/L 138 139 -  Potassium 3.5 - 5.1 mmol/L 3.0(LL) 3.5 -  Chloride 98 - 109 mmol/L 99 100 -  CO2 22 - 29 mmol/L 27 32 -  Calcium 8.4 - 10.4 mg/dL 8.9 8.6 -  Total Protein 6.4 - 8.3 g/dL 8.0 6.9 7.2  Total Bilirubin 0.2 - 1.2 mg/dL 0.4 0.2 -  Alkaline Phos 40 - 150 U/L 76 - -  AST 5 - 34 U/L 16 11 -  ALT 0 - 55 U/L 13 8 -     RADIOGRAPHIC STUDIES: I have personally reviewed the radiological images as listed and agreed with the findings in the report. No results found.  ASSESSMENT & PLAN: Hannah [name] is a  pleasant 57 year old AAFwith chronic anemia and abnormal SPEP  and IFE.   1. MGUS, IGA Kappa monoclonal protein  -We reviewed her medical chart including labs and imaging in detail with the patient. 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 She has no renal dysfunction. CMP is normal other than hypokalemia likely related to diuretic use. Dr. Burr Medico reviewed the nature of MGUS as a benign condition but carries a risk of transforming in to smoldering myeloma or multiple myeloma which is a cancerous condition. Dr. Burr Medico plan to repeat MM panel and light chains, will check bone survey to rule out lytic lesions.  -Will call to discuss labs and imaging results. If labs are stable will likely monitor her labs and condition q3 months. If M protein is very elevated or other abnormalities, Dr. Burr Medico may obtain bone marrow biopsy. The risks of the procedure were explained to the patient and she agrees.  -F/u pending labs, bone survey  2. Anemia, unspecified type  -She appears to have chronic mild microcytic anemia, Hgb 9.7 - 11, RDW is elevated; likely a component of iron deficiency. She has been on oral iron 1 tab BID although not consistently. Will recheck iron studies today. She will continue oral iron   3. HTN -controlled on chlorthalidone.  -followed by PCP   4. OA -She has imaging evidence consistent with OA in multiple joints. Pain currently controlled with NSAIDs and tylenol PRN.  -she was evaluated by rheumatology, will f/u there on PRN basis   5. Anxiety, depression -under care of [place] provider, stable and tolerating zoloft   6. Hypokalemia -likely secondary to diuretic; K. 3.0 today. I reviewed potassium rich foods to incorporate in her diet. Will begin oral K supplement 20 mEq, take 1 tab BID x3 days then continue 1 tab daily. I send Rx to her pharmacy.   PLAN: -repeats labs today: MM panel, light chains, CBC, CMP, iron studies -bone survey in next week  -f/u to  be determined based on lab and imaging findings  -will call patient to discuss results -hypokalemia: begin oral K therapy 20 Meq 1 tab BID x3 days then take 1 tab daily    All questions were answered. The patient knows to call the clinic with any problems, questions or concerns. I spent 45 minutes counseling the patient face to face. The total time spent in the appointment was 60 minutes and more than 50% was on counseling.     Alla Feeling, NP 10/24/17   Addendum  I have seen the patient, examined her. I agree with the assessment and and plan and have edited the notes.   Hannah [name] is a 57 yo female with PMH of OA, chronic anemia, hypertension, was referred due to her abnormal SPEP.  The test was done for her work-up of arthritis.  She has chronic long-standing osteoarthritis, has developed some low back pain in the past year, no other new symptoms.  Labs reviewed, she has microcytic mild anemia, will check her iron level.  No hypercalcemia, renal function is normal, no clinical high suspicion for multiple myeloma.  This is likely MGUS.  I will repeat SPEP with immunofixation, light chain levels, and get a bone survey.  I will decide if she requires a bone marrow biopsy after the above results coming back. Will call her with the results, and schedule her f/u accordingly.   Truitt Merle  10/24/2017

## 2017-10-24 NOTE — Progress Notes (Signed)
    Daily Group Progress Note  Program: IOP  Group Time: 9:00-12:00  Participation Level: Active  Behavioral Response: Appropriate  Type of Therapy:  Group Therapy  Summary of Progress: Pt. Presented as talkative, brightened affect, engaged in the group process. Pt. Discussed her plans to go to the beach for the [facility]. Pt. Discussed her ongoing family support, concerns about return to work and using skills to cope with work related stress, and learning "new normal" since the loss of her mother.      Nancie Neas, LPC

## 2017-10-25 ENCOUNTER — Encounter (HOSPITAL_COMMUNITY): Payer: Self-pay | Admitting: Family

## 2017-10-25 ENCOUNTER — Ambulatory Visit (HOSPITAL_COMMUNITY): Payer: Self-pay | Admitting: Psychiatry

## 2017-10-25 ENCOUNTER — Other Ambulatory Visit (HOSPITAL_COMMUNITY): Payer: PRIVATE HEALTH INSURANCE | Admitting: Psychiatry

## 2017-10-25 DIAGNOSIS — F320 Major depressive disorder, single episode, mild: Secondary | ICD-10-CM | POA: Diagnosis not present

## 2017-10-25 LAB — IRON AND TIBC
IRON: 51 ug/dL (ref 41–142)
SATURATION RATIOS: 18 % — AB (ref 21–57)
TIBC: 288 ug/dL (ref 236–444)
UIBC: 237 ug/dL

## 2017-10-25 LAB — KAPPA/LAMBDA LIGHT CHAINS
Kappa free light chain: 16.8 mg/L (ref 3.3–19.4)
Kappa, lambda light chain ratio: 2.9 — ABNORMAL HIGH (ref 0.26–1.65)
Lambda free light chains: 5.8 mg/L (ref 5.7–26.3)

## 2017-10-25 LAB — FERRITIN: Ferritin: 40 ng/mL (ref 9–269)

## 2017-10-25 NOTE — Progress Notes (Signed)
    Daily Group Progress Note  Program: IOP  Group Time: 9:00-12:00  Participation Level: Active  Behavioral Response: Appropriate  Type of Therapy:  Group Therapy  Summary of Progress: Pt. Presents as talkative, alert, engaged in the therapy process. Pt. Met with the nurse practitioner and case manager for discharge planning for planned discharge on tomorrow. Pt. Discussed her concerns about ADD and therapist provided information about following up with psychological testing for diagnosis. Pt. Discussed how her difficulty concentration, ease of distraction are interfering with her work performance and that they have been present in other areas of her life. Pt. Discussed ongoing challenge of planning to transition from her current job. Pt. Participated in discussion facilitated by the [place].    Nancie Neas, LPC

## 2017-10-25 NOTE — Progress Notes (Signed)
    Daily Group Progress Note  Program: IOP  Group Time: 9:00-12:00  Participation Level: Active  Behavioral Response: Appropriate  Type of Therapy:  Group Therapy  Summary of Progress: Pt. Presented as active and engaged in the group process. Pt. Discussed that she had a good weekend at the beach with her family and this was very therapeutic for her. Pt. Discussed her upcoming return to work and developing awareness that the job is not a good fit for her interests. Pt. 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 Pt. Discussed her plan to use her education degree and to enter the school system. Pt. Received feedback from the group about making a transition plan. Pt. Participated in grief and loss group with the Chaplain.    Nancie Neas, LPC

## 2017-10-25 NOTE — Progress Notes (Signed)
  [facility] Discharge Summary  MARTHENA WHITMYER [id number]  Admission date: 10/03/2017 Discharge date: 10/25/2017  Reason for admission: Depression   Per assessment note: Malonie Tatum 57 y.o African [place] female present after a referral by her primary care provider [place].  Patient reports worsening depression. States stress related to employment and multiple family stressors.  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 Reports she feels pressure perform current job duties and has not been able to keep up with tasks.  States slight resentment with upper management due to her supervisors have been younger than her.Sherman continues to ruminate with the passing of her mother. Reports her mother passed away 2 years ago from cervical cancer and states she continues grief and mourning her mother's death. Reports a fairly good relationship with her father however states her father relocated to [facility]. Kiaria reports she is  married with 2 children in the household, 77 y.o son who she reports  may have a learning disability, she is currently seeking for developmental screenings for autism and psychological testing.  Reports ongoing stress and worry with regards to her 76 year old son who recently got his third ( DUI ) driving while intoxicated.  Family of Origin Issues:  Reports her husband and children are supportive, states she will continue to work on her anxiety about returning back to work.   Progress in Program Toward Treatment Goals: Patient attended and participated in group session.  Patient reports she is looking forward to following up with her therapist.  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  Denies suicidal homicidal ideations during discharge interview.  Continues to present future or goal oriented.  -Continues to  express concerns with her disability approval  Progress (rationale): Ongoing, Follow-up with Eloise Levels 10-31-2017 an PCP Dr . Cyndra Numbers   Patient is requesting psychological testing.  Additional resources was provided with [place] attention specialists- or Dr. Jake Michaelis Altabet      Take all medications as prescribed. Keep all follow-up appointments as scheduled.  Do not consume alcohol or use illegal drugs while on prescription medications. Report any adverse effects from your medications to your primary care provider promptly.  In the event of recurrent symptoms or worsening symptoms, call 911, a crisis hotline, or go to the nearest emergency department for evaluation.   Derrill Center, NP 10/25/2017

## 2017-10-26 ENCOUNTER — Other Ambulatory Visit (HOSPITAL_COMMUNITY): Payer: PRIVATE HEALTH INSURANCE | Admitting: Psychiatry

## 2017-10-26 DIAGNOSIS — F320 Major depressive disorder, single episode, mild: Secondary | ICD-10-CM | POA: Diagnosis not present

## 2017-10-26 NOTE — Patient Instructions (Signed)
D:  Patient successfully completed MH-IOP today.  A:  Discharge today.  Follow up with Eloise Levels, LPC on 10-31-17 @ 2 pm and PCP (Dr. Osborne Casco).  Recommended patient to return to work on 11-06-17.  (In order to see providers).  R:  Pt receptive.

## 2017-10-26 NOTE — Progress Notes (Signed)
Hannah [name] is a 57 y.o. , married, employed, [place] American female; who was referred per PCP (Dr. Osborne Casco); treatment for worsening depressive symptoms with anxiety.  Admitted to passive SI upon admission.  Pt denied a plan or intent/means.  Pt currently denies any SI.  Also, denies HI or A/V hallucinations.  Admits to seeing a therapist in the past; denies seeing a psychiatrist in the past.  Denies inpt hospitalizations.  Stressors:  1)  Grief/Loss Issues:  Two yrs ago mother passed d/t uterine cancer.  Father resides two hrs away and pt assists him when needed.  2)  Job ([employer]) of sixteen yrs.  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  Reports being written up due to forgetting to say things on the phone to customers.  Plans to explore other job options.  3)  45 yr old son:  Pt states she worries about him.  He is ADD. "I think he's on the Autism Spectrum."  Pt plans to have him tested.  63 yr old son is also ADD and resides at the home.  ETOH issues; third DUI.  Family hx:  Maternal Uncle (Depression)   Pt completed MH-IOP today.  Reports overall mood improving.  Admits to feeling anxious about her return to work.  A:  D/C today.  F/U with Eloise Levels, PhD on 10-31-17 @ 2 pm.  Pt will also f/u with Dr. Osborne Casco as needed.  Recommend patient return to work on 11-06-17.  Encouraged support groups.  R:  Pt receptive.                                                          Carlis Abbott, RITA, M.Ed, CNA

## 2017-10-27 ENCOUNTER — Other Ambulatory Visit (HOSPITAL_COMMUNITY): Payer: PRIVATE HEALTH INSURANCE

## 2017-10-27 LAB — MULTIPLE MYELOMA PANEL, SERUM
ALBUMIN/GLOB SERPL: 1 (ref 0.7–1.7)
ALPHA 1: 0.2 g/dL (ref 0.0–0.4)
ALPHA2 GLOB SERPL ELPH-MCNC: 0.6 g/dL (ref 0.4–1.0)
Albumin SerPl Elph-Mcnc: 3.6 g/dL (ref 2.9–4.4)
B-GLOBULIN SERPL ELPH-MCNC: 2.3 g/dL — AB (ref 0.7–1.3)
GAMMA GLOB SERPL ELPH-MCNC: 0.7 g/dL (ref 0.4–1.8)
GLOBULIN, TOTAL: 3.8 g/dL (ref 2.2–3.9)
IGG (IMMUNOGLOBIN G), SERUM: 855 mg/dL (ref 700–1600)
IGM (IMMUNOGLOBULIN M), SRM: 32 mg/dL (ref 26–217)
IgA: 1795 mg/dL — ABNORMAL HIGH (ref 87–352)
M Protein SerPl Elph-Mcnc: 1.2 g/dL — ABNORMAL HIGH
Total Protein ELP: 7.4 g/dL (ref 6.0–8.5)

## 2017-10-30 ENCOUNTER — Other Ambulatory Visit (HOSPITAL_COMMUNITY): Payer: 59

## 2017-10-30 ENCOUNTER — Telehealth: Payer: Self-pay | Admitting: Nurse Practitioner

## 2017-10-30 ENCOUNTER — Telehealth: Payer: Self-pay

## 2017-10-30 NOTE — Progress Notes (Signed)
    Daily Group Progress Note  Program: IOP  Group Time: 9:00-12:00  Participation Level: Active  Behavioral Response: Appropriate  Type of Therapy:  Group Therapy  Summary of Progress: Pt. Prepared for discharge and met with the case manager and NP. Pt. Received feedback from the group therapist and group members regarding her participation in group. Pt. Discussed her plans to return to work, continue with her therapy and self-care. Pt. Participated in group about use of aromatherapy and essential oils as complementary therapy for anxiety and depression.     Nancie Neas, LPC

## 2017-10-30 NOTE — Telephone Encounter (Signed)
Patient calls stating she called to schedule bone marrow biopsy and was forwarded to voice mail.  None scheduled,   Her [phone]

## 2017-10-30 NOTE — Telephone Encounter (Signed)
I reviewed labs with Dr. Burr Medico and called patient to discuss. Due to elevated M spike, MD recommends bone marrow biopsy. She has not had bone survey yet, will need to undergo this test as well. Will arrange for these studies and f/u with patient in office 1-2 weeks depending on when the above can be done. I briefly reviewed BM procedure, she agrees. She verbalizes understanding.

## 2017-10-31 ENCOUNTER — Ambulatory Visit (INDEPENDENT_AMBULATORY_CARE_PROVIDER_SITE_OTHER): Payer: PRIVATE HEALTH INSURANCE | Admitting: Psychiatry

## 2017-10-31 ENCOUNTER — Other Ambulatory Visit (HOSPITAL_COMMUNITY): Payer: 59

## 2017-10-31 DIAGNOSIS — F321 Major depressive disorder, single episode, moderate: Secondary | ICD-10-CM

## 2017-10-31 DIAGNOSIS — F320 Major depressive disorder, single episode, mild: Secondary | ICD-10-CM

## 2017-11-01 ENCOUNTER — Other Ambulatory Visit (HOSPITAL_COMMUNITY): Payer: 59

## 2017-11-02 ENCOUNTER — Telehealth: Payer: Self-pay | Admitting: Adult Health

## 2017-11-02 ENCOUNTER — Other Ambulatory Visit (HOSPITAL_COMMUNITY): Payer: 59

## 2017-11-02 NOTE — Progress Notes (Signed)
Psychiatric Initial Adult Assessment   Patient Identification: Hannah [name] MRN:  [id number] Date of Evaluation:  11/04/2017 Referral Source: IOP, Ricky Ala, NP Chief Complaint:   Chief Complaint    Follow-up; Depression    "I'm really not happy" Visit Diagnosis:    ICD-10-CM   1. MDD (major depressive disorder), recurrent episode, moderate (HCC) F33.1     History of Present Illness:   Hannah [name] is a 57 y.o. year old female with a history of depression, abnormal SPEP, OA, chronic anemia, hypertension , who is referred for depression. She completed IOP for depression.   She states that she is here for depression. She recently finished IOP and have found it helpful as she is now more aware that she has symptoms of depression. She has been struggling with hopelessness, difficulty in concentration for a while. She believes that she has been suffering from depression for many years. She came to the program partly because her son at home, age 72 recommended it. She has been off from work since May 7th. She had to take days off prior to it as she knows that she will not be able to handle interacting with others due to fatigue and irritability. Thoughts of going back to work makes her feels more anxious, causes panic attacks and she does not think she is ready to back to work. She has been overwhelmed and stressed about their management at work. She did not like to be interrupted by monitor while she was talking with customers. She lash out to her supervisor at times. She feels that what "I am doing does not matter to others" when she works. She is weighing other job options, stating that she has a degree in business. She is isolative and tends to stay in the bed lately. Although she does volunteer at [employer], she feels exhausted and does not enjoy it. Although she used to be very active, she stopped doing things due to OA. She is also concerned about upcoming bone marrow biopsy, referring that her  mother suffered from gynecological cancer. She has fear that she may have some malignancy. She misses her mother at times; her mother deceased in 09-23-15. She has been unable to share her feelings with others, stating that she is a "glue" to the family and take care of other family members. She goes to see her father every other week, who lives in the area of two hours of driving from her place. She reports good relationship with her husband and her son.   She has insomnia. She feels fatigue and depressed. She has difficulty in concentration. She denies SI. She feels anxious, tense. She has occasional panic attacks. She denies alcohol use or drug use.   Associated Signs/Symptoms: Depression Symptoms:  depressed mood, anhedonia, insomnia, fatigue, (Hypo) Manic Symptoms:  denies decreased need for sleep, euphoria Anxiety Symptoms:  Excessive Worry, Panic Symptoms, Psychotic Symptoms:  denies AH, VH PTSD Symptoms: Negative  Past Psychiatric History: Outpatient: denies Psychiatry admission: denies  Previous suicide attempt: denies Past trials of medication: sertraline  History of violence: denies   Previous Psychotropic Medications: Yes   Substance Abuse History in the last 12 months:  No.  Consequences of Substance Abuse: NA  Past Medical History:  Past Medical History:  Diagnosis Date  . Allergy   . Anemia   . Anxiety   . Arthritis   . Asthma   . Depression   . Hypertension     Past Surgical History:  Procedure Laterality Date  . CHOLECYSTECTOMY  1993  . COLONOSCOPY    . 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 TOTAL KNEE ARTHROPLASTY Right 01/28/2016   Procedure: RIGHT TOTAL KNEE ARTHROPLASTY;  Surgeon: [name], MD;  Location: [facility];  Service: Orthopedics;  Laterality: Right;  . TRIGGER FINGER RELEASE Right 06/18/2014   Procedure: RIGHT LONG FINGER TRIGGER RELEASE;  Surgeon: Hannah Payment, MD;  Location: [facility];  Service: Orthopedics;   Laterality: Right;  . TUBAL LIGATION    . VAGINAL HYSTERECTOMY Bilateral 11/04/2016   Procedure: HYSTERECTOMY VAGINAL with Bilateral Salpingectomy;  Surgeon: Hannah Manges, MD;  Location: [facility];  Service: Gynecology;  Laterality: Bilateral;    Family Psychiatric History:  Maternal uncle- some issues in [place]-  not formally diagnosed  Family History:  Family History  Problem Relation Age of Onset  . Cancer Mother        uterine   . Asthma Mother   . Arthritis Daughter   . Depression Maternal Uncle   . Colon cancer Neg Hx     Social History:   Social History   Socioeconomic History  . Marital status: Married    Spouse name: Not on file  . Number of children: Not on file  . Years of education: Not on file  . Highest education level: Not on file  Occupational History  . Not on file  Social Needs  . Financial resource strain: Not hard at all  . Food insecurity:    Worry: Never true    Inability: Never true  . Transportation needs:    Medical: No    Non-medical: No  Tobacco Use  . Smoking status: Never Smoker  . Smokeless tobacco: Never Used  Substance and Sexual Activity  . Alcohol use: No    Alcohol/week: 0.0 oz  . Drug use: No  . Sexual activity: Yes    Partners: Male    Birth control/protection: Surgical  Lifestyle  . Physical activity:    Days per week: 0 days    Minutes per session: 0 min  . Stress: Very much  Relationships  . Social connections:    Talks on phone: More than three times a week    Gets together: Once a week    Attends religious service: More than 4 times per year    Active member of club or organization: Yes    Attends meetings of clubs or organizations: More than 4 times per year    Relationship status: Married  Other Topics Concern  . Not on file  Social History Narrative  . Not on file    Additional Social History:  Married, she lives with her husband of 84 years (in November) and her son, age 31. Her husband is "very laid  back" and is supportive to the patient.  She grew up in [place]. She has two sisters. She reports her father is laid back and her mother had "very strong personality"; she deceased in Aug 03, 2015.  Work: [employer] call center for 16 years.   Education: graduated from college, Chemical engineer in business education  Allergies:   Allergies  Allergen Reactions  . Sulfur Swelling    Facial swelling     Metabolic Disorder Labs: No results found for: HGBA1C, MPG No results found for: PROLACTIN No results found for: CHOL, TRIG, HDL, CHOLHDL, VLDL, LDLCALC   Current Medications: Current Outpatient Medications  Medication Sig Dispense Refill  . B Complex-C (SUPER B COMPLEX PO) Take  1 tablet by mouth daily.     . budesonide-formoterol (SYMBICORT) 160-4.5 MCG/ACT inhaler Inhale 2 puffs into the lungs daily as needed.    . chlorthalidone (HYGROTON) 25 MG tablet TAKE 1 TABLET BY MOUTH EVERY DAY FOR HTN  3  . docusate sodium (COLACE) 100 MG capsule Take 1 capsule (100 mg total) by mouth 2 (two) times daily. 10 capsule 0  . ferrous sulfate 325 (65 FE) MG tablet Take 325 mg by mouth 2 (two) times daily with a meal.    . hydrochlorothiazide (HYDRODIURIL) 12.5 MG tablet Take 12.5 mg by mouth daily.    Marland Kitchen ibuprofen (ADVIL,MOTRIN) 600 MG tablet 600 MG BY MOUTH EVERY 6 HOURS FOR 5 DAYS, THEN EVERY 6 HOURS AS NEEDED FOR PAIN.  DON'T TAKE MOBIC WHILE TAKING IBUPROFEN 60 tablet 0  . Multiple Vitamin (MULTIVITAMIN) tablet Take 1 tablet by mouth daily.    . naproxen sodium (ALEVE) 220 MG tablet Take 220 mg by mouth as needed.    . ondansetron (ZOFRAN) 4 MG tablet Take 1 tablet (4 mg total) by mouth every 8 (eight) hours as needed for nausea or vomiting. 20 tablet 0  . oxyCODONE-acetaminophen (PERCOCET/ROXICET) 5-325 MG tablet Take 1-2 tablets by mouth every 4 (four) hours as needed for severe pain (moderate to severe pain (when tolerating fluids)). 30 tablet 0  . Potassium (POTASSIMIN PO) Take by mouth.    . potassium  chloride SA (K-DUR,KLOR-CON) 20 MEQ tablet Take 1 tablet (20 mEq total) by mouth daily. Take 1 tab twice daily for 3 days then take 1 tab daily 33 tablet 0  . sertraline (ZOLOFT) 50 MG tablet Take 1.5 tablets (75 mg total) by mouth at bedtime. 45 tablet 1  . Vitamin D, Ergocalciferol, (DRISDOL) 50000 units CAPS capsule Take 50,000 Units by mouth every Monday.      No current facility-administered medications for this visit.     Neurologic: Headache: No Seizure: No Paresthesias:No  Musculoskeletal: Strength & Muscle Tone: within normal limits Gait & Station: normal Patient leans: N/A  Psychiatric Specialty Exam: Review of Systems  Psychiatric/Behavioral: Positive for depression. Negative for hallucinations, memory loss, substance abuse and suicidal ideas. The patient is nervous/anxious and has insomnia.   All other systems reviewed and are negative.   Blood pressure 124/83, pulse 88, height _0  (1.676 m), weight 245 lb (111.1 kg), last menstrual period 10/22/2016, SpO2 93 %.Body mass index is 39.54 kg/m.  General Appearance: Fairly Groomed  Eye Contact:  Good  Speech:  Clear and Coherent  Volume:  Normal  Mood:  Depressed  Affect:  Restricted, Tearful and down  Thought Process:  Coherent  Orientation:  Full (Time, Place, and Person)  Thought Content:  Logical  Suicidal Thoughts:  No  Homicidal Thoughts:  No  Memory:  Immediate;   Good  Judgement:  Good  Insight:  Fair  Psychomotor Activity:  Normal  Concentration:  Concentration: Good and Attention Span: Good  Recall:  Good  Fund of Knowledge:Good  Language: Good  Akathisia:  No  Handed:  Right  AIMS (if indicated):  N/A  Assets:  Communication Skills Desire for Improvement  ADL's:  Intact  Cognition: WNL  Sleep:  fair   Assessment TAREVA LESKE is a 57 y.o. year old female with a history of depression, abnormal SPEP, OA, chronic anemia, hypertension , who is referred for depression. She completed IOP for  depression.   # MDD, moderate, recurrent without psychotic features Exam is notable for restricted affect  and patient continues to endorse neurovegetative symptoms and irritability. Psychosocial stressor including work environment and she is demoralized by her medical condition. Will uptitrate sertraline to optimize its effect for depression. Will do gradual uptitration given patient preference.  Discussed behavioral activation of having structure in a day and does daily exercise (waterobic). Explored her value of connection with her family and discussed value congruent action she can take. She will continue to see a therapist.   She will not be able to return to work until the next appointment due to severity of her symptoms. Her current mood symptoms interferes ability to collaborate with others, interacting with customers or completing tasks.    Plan 1. Increase sertraline 75 mg at night  2. Return to clinic in one month for 30 mins 3. Hypokalemia- followed at cancer center per patient - She will continue to see Mr. Owens Shark for therapy  The patient demonstrates the following risk factors for suicide: Chronic risk factors for suicide include: psychiatric disorder of depression and medical illness of OA. Acute risk factors for suicide include: N/A. Protective factors for this patient include: positive social support, responsibility to others (children, family), coping skills and hope for the future. Considering these factors, the overall suicide risk at this point appears to be low. Patient is appropriate for outpatient follow up.   Treatment Plan Summary: Plan as above   Norman Clay, MD 6/8/201911:51 AM

## 2017-11-02 NOTE — Progress Notes (Signed)
   THERAPIST PROGRESS NOTE  Session Time: 2:15-3:00  Participation Level: Active  Behavioral Response: CasualLethargicDepressed  Type of Therapy: Individual Therapy  Treatment Goals addressed: Coping  Interventions: CBT  Summary: Hannah [name] is a 57 y.o. female who presents with major depressive disorder.   Suicidal/Homicidal: Nowithout intent/plan  Therapist Response: Pt. Presented for her first therapy session following her discharge from the IOP program. Pt. Presented as lethargic, appeared sad, appropriately tearful, complained of poor motivation to participate in family activities, household chores. Pt. Reported that she felt like doing little else other than sleep. Pt. Discussed her reluctance to give herself time to heal and felt pressure to return to work. Pt. Received concerning results from blood test that required her to schedule further blood marrow testing at the cancer center next week. Pt. Discussed that she is worrying about the results of the blood test. Pt. Also expressed concern for her adult daughter who is also experiencing a mental health crisis. Pt. Did not appear in condition to return to work and was encouraged to take additional time to focus on stress management and recovery from depression crisis.  Plan: Return again in 2 weeks.  Diagnosis: Major Depressive Episode, Moderate    Nancie Neas, [facility] 11/02/2017

## 2017-11-02 NOTE — Telephone Encounter (Signed)
Called patient to review bone marrow biopsy.  Reviewed with her that due to her weight, it would be a challenging procedure, and she may prefer having the bone marrow biopsy done with CT guidance.  I discussed how the procedure was done.  I reviewed the differences between having the procedure done here or in radiology. Patient would like to think about her options, and will call me back to let me know.  Wilber Bihari, NP

## 2017-11-03 ENCOUNTER — Other Ambulatory Visit (HOSPITAL_COMMUNITY): Payer: 59

## 2017-11-04 ENCOUNTER — Ambulatory Visit (INDEPENDENT_AMBULATORY_CARE_PROVIDER_SITE_OTHER): Payer: PRIVATE HEALTH INSURANCE | Admitting: Psychiatry

## 2017-11-04 ENCOUNTER — Telehealth (HOSPITAL_COMMUNITY): Payer: Self-pay

## 2017-11-04 ENCOUNTER — Encounter (HOSPITAL_COMMUNITY): Payer: Self-pay | Admitting: Psychiatry

## 2017-11-04 VITALS — BP 124/83 | HR 88 | Ht 66.0 in | Wt 245.0 lb

## 2017-11-04 DIAGNOSIS — M1990 Unspecified osteoarthritis, unspecified site: Secondary | ICD-10-CM | POA: Diagnosis not present

## 2017-11-04 DIAGNOSIS — Z818 Family history of other mental and behavioral disorders: Secondary | ICD-10-CM

## 2017-11-04 DIAGNOSIS — F419 Anxiety disorder, unspecified: Secondary | ICD-10-CM | POA: Diagnosis not present

## 2017-11-04 DIAGNOSIS — G4700 Insomnia, unspecified: Secondary | ICD-10-CM

## 2017-11-04 DIAGNOSIS — Z566 Other physical and mental strain related to work: Secondary | ICD-10-CM

## 2017-11-04 DIAGNOSIS — F331 Major depressive disorder, recurrent, moderate: Secondary | ICD-10-CM

## 2017-11-04 DIAGNOSIS — R454 Irritability and anger: Secondary | ICD-10-CM

## 2017-11-04 DIAGNOSIS — E876 Hypokalemia: Secondary | ICD-10-CM | POA: Diagnosis not present

## 2017-11-04 DIAGNOSIS — R450 Nervousness: Secondary | ICD-10-CM | POA: Diagnosis not present

## 2017-11-04 DIAGNOSIS — F410 Panic disorder [episodic paroxysmal anxiety] without agoraphobia: Secondary | ICD-10-CM

## 2017-11-04 MED ORDER — SERTRALINE HCL 50 MG PO TABS: 75.0000 mg | ORAL_TABLET | Freq: Every day | ORAL | 1 refills | Status: DC

## 2017-11-04 NOTE — Telephone Encounter (Signed)
Patient came to the office to see Dr. Modesta Messing, she asked that I have you call her, she would like to see you next week, patient states she is not doing real well.

## 2017-11-04 NOTE — Patient Instructions (Signed)
1. Increase sertraline 75 mg daily  2. Return to clinic in one month for 30 mins

## 2017-11-06 ENCOUNTER — Other Ambulatory Visit (HOSPITAL_COMMUNITY): Payer: 59

## 2017-11-06 ENCOUNTER — Ambulatory Visit (HOSPITAL_COMMUNITY)
Admission: RE | Admit: 2017-11-06 | Discharge: 2017-11-06 | Disposition: A | Discharge status: Home / Self Care | Payer: 59 | Source: Ambulatory Visit | Attending: Hematology | Admitting: Hematology

## 2017-11-06 DIAGNOSIS — D472 Monoclonal gammopathy: Secondary | ICD-10-CM | POA: Diagnosis present

## 2017-11-07 ENCOUNTER — Telehealth: Payer: Self-pay

## 2017-11-07 ENCOUNTER — Other Ambulatory Visit (HOSPITAL_COMMUNITY): Payer: 59

## 2017-11-07 ENCOUNTER — Inpatient Hospital Stay: Payer: 59

## 2017-11-07 NOTE — Telephone Encounter (Signed)
Hannah [name], please let the patient know that bone marrow biopsies require coordinations between three different departments: the medical oncology team (myself/Dr. Lindi Adie, etc), the flow lab (lab techs who come and collect the specimen), and nursing in the infusion room (must have a procedure bed and nurse to help).    This requires planning as all three of these teams must coordinate an available time for collection.  Please let her know that due to all of these variables, we only perform bone marrow biopsies in the morning at the cancer center for her to arrive at 730 am at her appointment time.    If something cancels later this week however, my nurse Cecille Rubin will call her.    Thanks,  Mendel Ryder

## 2017-11-07 NOTE — Telephone Encounter (Signed)
Per Dr. Burr Medico let patient know bone survey is negative.  She had an appointment to have bone marrow biopsy today but overslept.  She was very upset she missed it.  Has been reschedule for Monday but if Wilber Bihari NP if she has cancellation for today she wants it.

## 2017-11-07 NOTE — Telephone Encounter (Signed)
-----  Message from Truitt Merle, MD sent at 11/06/2017 11:33 PM EDT ----- Malachy Mood, please let pt know the bone survey was negative. Please encourage her to decide if she wants bone marrow biopsy here or at radiology (due to her weight) and we will arrange accordingly). Please schedule her to see me 2 weeks after bone marrow biopsy after her biopsy is scheduled.   Thanks  Truitt Merle

## 2017-11-08 ENCOUNTER — Other Ambulatory Visit (HOSPITAL_COMMUNITY): Payer: 59

## 2017-11-09 ENCOUNTER — Other Ambulatory Visit (HOSPITAL_COMMUNITY): Payer: 59

## 2017-11-10 ENCOUNTER — Other Ambulatory Visit (HOSPITAL_COMMUNITY): Payer: 59

## 2017-11-13 ENCOUNTER — Inpatient Hospital Stay: Payer: 59 | Attending: Nurse Practitioner | Admitting: Hematology and Oncology

## 2017-11-13 ENCOUNTER — Inpatient Hospital Stay: Payer: 59

## 2017-11-13 ENCOUNTER — Other Ambulatory Visit (HOSPITAL_COMMUNITY): Payer: 59

## 2017-11-13 VITALS — BP 153/94 | HR 71 | Temp 98.7°F | Resp 18

## 2017-11-13 DIAGNOSIS — D472 Monoclonal gammopathy: Secondary | ICD-10-CM | POA: Insufficient documentation

## 2017-11-13 LAB — CBC WITH DIFFERENTIAL (CANCER CENTER ONLY)
Basophils Absolute: 0 K/uL (ref 0.0–0.1)
Basophils Relative: 0 %
Eosinophils Absolute: 0.1 K/uL (ref 0.0–0.5)
Eosinophils Relative: 1 %
HCT: 34.8 % (ref 34.8–46.6)
Hemoglobin: 11.4 g/dL — ABNORMAL LOW (ref 11.6–15.9)
Lymphocytes Relative: 27 %
Lymphs Abs: 1.8 K/uL (ref 0.9–3.3)
MCH: 23.5 pg — ABNORMAL LOW (ref 25.1–34.0)
MCHC: 32.8 g/dL (ref 31.5–36.0)
MCV: 71.6 fL — ABNORMAL LOW (ref 79.5–101.0)
Monocytes Absolute: 0.4 K/uL (ref 0.1–0.9)
Monocytes Relative: 6 %
Neutro Abs: 4.4 K/uL (ref 1.5–6.5)
Neutrophils Relative %: 66 %
Platelet Count: 229 K/uL (ref 145–400)
RBC: 4.86 MIL/uL (ref 3.70–5.45)
RDW: 16.3 % — ABNORMAL HIGH (ref 11.2–14.5)
WBC Count: 6.6 K/uL (ref 3.9–10.3)

## 2017-11-13 MED ORDER — LIDOCAINE HCL 2 % IJ SOLN
INTRAMUSCULAR | Status: AC
  Filled 2017-11-13: qty 20

## 2017-11-13 NOTE — Progress Notes (Signed)
INDICATION: r/u multiple myeloma, MGUS Assisted by Dr. Gudena   Bone Marrow Biopsy and Aspiration Procedure Note   Informed consent was obtained and potential risks including bleeding, infection and pain were reviewed with the patient.  The patient's name, date of birth, identification, consent and allergies were verified prior to the start of procedure and time out was performed.  The left posterior iliac crest was chosen as the site of biopsy.  The skin was prepped with ChloraPrep.   12 cc of 1% lidocaine was used to provide local anaesthesia.   8 cc of bone marrow aspirate was obtained followed by 1cm biopsy.  Pressure was applied to the biopsy site and bandage was placed over the biopsy site. Patient was made to lie on the back for 30 mins prior to discharge.  The procedure was tolerated well. COMPLICATIONS: None BLOOD LOSS: none The patient was discharged home in stable condition with a 1 week follow up to review results.  Patient was provided with post bone marrow biopsy instructions and instructed to call if there was any bleeding or worsening pain.  Specimens sent for flow cytometry, cytogenetics and additional studies.  Signed Lindsey C Causey, NP  ATTENDING NOTE: I supervised the procedure from consent to the completion of procedure. I assisted Lindsey in the procedure as well. Patient tolerated the procedure extremely well. No complications.   

## 2017-11-13 NOTE — Patient Instructions (Signed)
Bone Marrow Aspiration and Bone Marrow Biopsy, Adult, Care After This sheet gives you information about how to care for yourself after your procedure. Your health care provider may also give you more specific instructions. If you have problems or questions, contact your health care provider. What can I expect after the procedure? After the procedure, it is common to have:  Mild pain and tenderness.  Swelling.  Bruising.  Follow these instructions at home:  Take over-the-counter or prescription medicines only as told by your health care provider.  Do not take baths, swim, or use a hot tub until your health care provider approves. Ask if you can take a shower or have a sponge bath.  Follow instructions from your health care provider about how to take care of the puncture site. Make sure you: ? Wash your hands with soap and water before you change your bandage (dressing). If soap and water are not available, use hand sanitizer. ? Change your dressing as told by your health care provider.  Check your puncture siteevery day for signs of infection. Check for: ? More redness, swelling, or pain. ? More fluid or blood. ? Warmth. ? Pus or a bad smell.  Return to your normal activities as told by your health care provider. Ask your health care provider what activities are safe for you.  Do not drive for 24 hours if you were given a medicine to help you relax (sedative).  Keep all follow-up visits as told by your health care provider. This is important. Contact a health care provider if:  You have more redness, swelling, or pain around the puncture site.  You have more fluid or blood coming from the puncture site.  Your puncture site feels warm to the touch.  You have pus or a bad smell coming from the puncture site.  You have a fever.  Your pain is not controlled with medicine. This information is not intended to replace advice given to you by your health care provider. Make sure  you discuss any questions you have with your health care provider. Document Released: 12/03/2004 Document Revised: 12/04/2015 Document Reviewed: 10/28/2015 Elsevier Interactive Patient Education  2018 [employer].

## 2017-11-13 NOTE — Progress Notes (Signed)
At 0900 1min post procedure, site dressing clean/dry/intact, no bleeding or bruising noted.

## 2017-11-14 ENCOUNTER — Ambulatory Visit (INDEPENDENT_AMBULATORY_CARE_PROVIDER_SITE_OTHER): Payer: 59 | Admitting: Psychiatry

## 2017-11-14 ENCOUNTER — Encounter (HOSPITAL_COMMUNITY): Payer: Self-pay | Admitting: Psychiatry

## 2017-11-14 ENCOUNTER — Other Ambulatory Visit (HOSPITAL_COMMUNITY): Payer: 59

## 2017-11-14 DIAGNOSIS — F331 Major depressive disorder, recurrent, moderate: Secondary | ICD-10-CM

## 2017-11-14 DIAGNOSIS — F321 Major depressive disorder, single episode, moderate: Secondary | ICD-10-CM | POA: Diagnosis not present

## 2017-11-15 ENCOUNTER — Other Ambulatory Visit (HOSPITAL_COMMUNITY): Payer: 59

## 2017-11-16 ENCOUNTER — Other Ambulatory Visit: Payer: Self-pay | Admitting: Nurse Practitioner

## 2017-11-16 ENCOUNTER — Other Ambulatory Visit (HOSPITAL_COMMUNITY): Payer: 59

## 2017-11-16 DIAGNOSIS — E876 Hypokalemia: Secondary | ICD-10-CM

## 2017-11-17 ENCOUNTER — Other Ambulatory Visit (HOSPITAL_COMMUNITY): Payer: 59

## 2017-11-17 NOTE — Progress Notes (Signed)
   THERAPIST PROGRESS NOTE  Session Time: 2:10-3:00  Participation Level: Active  Behavioral Response: CasualLethargicDepressed  Type of Therapy: Individual Therapy  Treatment Goals addressed: Coping  Interventions: CBT  Summary: Hannah [name] is a 57 y.o. female who presents with major depressive disorder.   Suicidal/Homicidal: Nowithout intent/plan  Therapist Response: Pt. As lethargic, depressed. Pt. Processed loss of her mother two years ago. Pt. Was given referral to follow up with hospice for grief counseling. Pt. Discussed history of caregiving for her father and feeling that her sisters do not take on their share of the responsibility, and that her younger sister is excessively dependent. Pt. Reported significant pain due to recent medical procedures. Pt. Discussed that she has been sleeping a lot and has low motivation to engage in household tasks. Session focused on accepting her sister for who she is and releasing herself of the responsibility to control her sister's actions.  Plan: Return again in 2 weeks.  Diagnosis:      Major Depressive Episode, Moderate   Nancie Neas, [facility] 11/17/2017

## 2017-11-20 ENCOUNTER — Other Ambulatory Visit (HOSPITAL_COMMUNITY): Payer: 59

## 2017-11-21 ENCOUNTER — Other Ambulatory Visit (HOSPITAL_COMMUNITY): Payer: 59

## 2017-11-22 ENCOUNTER — Other Ambulatory Visit (HOSPITAL_COMMUNITY): Payer: 59

## 2017-11-22 ENCOUNTER — Encounter (HOSPITAL_COMMUNITY): Payer: Self-pay | Admitting: Hematology

## 2017-11-23 ENCOUNTER — Other Ambulatory Visit (HOSPITAL_COMMUNITY): Payer: 59

## 2017-11-23 ENCOUNTER — Telehealth: Payer: Self-pay | Admitting: Hematology

## 2017-11-23 NOTE — Telephone Encounter (Signed)
Called pt re appts that were added per 6/26 sch msg - spoke w/ pt re appts.

## 2017-11-23 NOTE — Progress Notes (Signed)
[place]  [phone] Fax:([phone]) [phone]  Clinic Follow-up Note   Patient Care Team: Tisovec, Fransico Him, MD as PCP - General (Internal Medicine) Bo Merino, MD as Consulting Physician (Rheumatology) 11/28/2017  CHIEF COMPLAINTS:  Follow-up lab and bone marrow biopsy results    HISTORY OF PRESENTING ILLNESS (10/24/2017):  Hannah [name] 57 y.o. female is here because of abnormal blood work. She was referred by her rheumatologist, Dr. Estanislado Pandy. She presents alone for today's consult. She was found to have abnormal CBC on 01/15/2016 with mild anemia Hgb 10.6, low MCV 71.7, and elevated RDW 16.4%. Her anemia appears chronic; underwent on 11/16/2016 per GYN for menorrhagia.  She has taken iron sporadically for a few months. She was referred to rheumatology on 09/05/17 for evaluation and work up for existing bone/joint pain. She has imaging findings consisted with OA. She has relatively new pelvic and lower back pain within the last 6 months. OA pain is controlled with aleve and extra strength tylenol PRN. She reported to rheumatology few months history of fatigue, which she attributed to recent diagnosis of anxiety and depression, which prompted further lab work. She illness or injury. Sed rate elevated to 53, SPEP with two abnormal protein bands detected, one in the gamma globulins and one in the beta globulins. 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   Past medical history includes anemia on iron therapy, OA of knees, right shoulder, bilateral hands, and bilateral hips, HTN, asthma, depression, and anxiety. She is stable on zoloft and seeing mental health counselor. S/p hysterectomy for menorrhagia in 10/2016 per Dr. Leo Grosser and total right knee arthroplasty on 12/2015 per Dr. Erlinda Hong. She lives in her home, independent of all ADLs. She works as a Secondary school teacher. Family history is positive for mother with uterine cancer. No prior personal history of  cancer; she is up to date on routine cancer screenings such as PAP, mammogram, and colonoscopy.   Today she reports few months history of fatigue, she is still able to work and function. Has 45-monthhistory of night sweats which she attributes to menopause. No decreased appetite, fever, chills, or abnormal unexplained weight loss. No recent bleeding, cough, chest pain, dyspnea, n/v/c/d. Mood is controlled. Sleep is fair. Joint pain is controlled with NSAIDs and tylenol PRN. Denies neuropathy or skin rash.   CURRENT THERAPY: Surveillance and oral iron   INTERVAL HISTORY: .Hannah MULDREWis a 57y.o. female who is here for follow-up and discussed her recent test results. She presents to the clinic today with her family member. She complains of right knee pain. She has a right knee replacement, but still experiences pain. She feels fatigued with generalized pains and aches including her back and pelvic area. These symptoms' have been these for  6 months-1 year, which she contributes to decreased physical activity.  She is compliant with potassium pills. She states that she takes iron pills BID. She had a hysterectomy last year due to menorrhagia.    MEDICAL HISTORY:  Past Medical History:  Diagnosis Date  . Allergy   . Anemia   . Anxiety   . Arthritis   . Asthma   . Depression   . Hypertension     SURGICAL HISTORY: Past Surgical History:  Procedure Laterality Date  . CHOLECYSTECTOMY  1993  . COLONOSCOPY    . 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 TOTAL KNEE ARTHROPLASTY Right 01/28/2016   Procedure: RIGHT TOTAL KNEE ARTHROPLASTY;  Surgeon: NMarylynn Pearson  Erlinda Hong, MD;  Location: [facility];  Service: Orthopedics;  Laterality: Right;  . TRIGGER FINGER RELEASE Right 06/18/2014   Procedure: RIGHT LONG FINGER TRIGGER RELEASE;  Surgeon: [name], MD;  Location: [facility];  Service: Orthopedics;  Laterality: Right;  . TUBAL LIGATION    . VAGINAL HYSTERECTOMY Bilateral 11/04/2016    Procedure: HYSTERECTOMY VAGINAL with Bilateral Salpingectomy;  Surgeon: [name], MD;  Location: [facility];  Service: Gynecology;  Laterality: Bilateral;    SOCIAL HISTORY: Social History   Socioeconomic History  . Marital status: Married    Spouse name: Not on file  . Number of children: Not on file  . Years of education: Not on file  . Highest education level: Not on file  Occupational History  . Not on file  Social Needs  . Financial resource strain: Not hard at all  . Food insecurity:    Worry: Never true    Inability: Never true  . Transportation needs:    Medical: No    Non-medical: No  Tobacco Use  . Smoking status: Never Smoker  . Smokeless tobacco: Never Used  Substance and Sexual Activity  . Alcohol use: No    Alcohol/week: 0.0 oz  . Drug use: No  . Sexual activity: Yes    Partners: Male    Birth control/protection: Surgical  Lifestyle  . Physical activity:    Days per week: 0 days    Minutes per session: 0 min  . Stress: Very much  Relationships  . Social connections:    Talks on phone: More than three times a week    Gets together: Once a week    Attends religious service: More than 4 times per year    Active member of club or organization: Yes    Attends meetings of clubs or organizations: More than 4 times per year    Relationship status: Married  . Intimate partner violence:    Fear of current or ex partner: No    Emotionally abused: No    Physically abused: No    Forced sexual activity: No  Other Topics Concern  . Not on file  Social History Narrative  . Not on file    FAMILY HISTORY: Family History  Problem Relation Age of Onset  . Cancer Mother        uterine   . Asthma Mother   . Arthritis Daughter   . Depression Maternal Uncle   . Colon cancer Neg Hx     ALLERGIES:  is allergic to sulfur.  MEDICATIONS:  Current Outpatient Medications  Medication Sig Dispense Refill  . budesonide-formoterol (SYMBICORT) 160-4.5 MCG/ACT  inhaler Inhale 2 puffs into the lungs daily as needed.    . chlorthalidone (HYGROTON) 25 MG tablet TAKE 1 TABLET BY MOUTH EVERY DAY FOR HTN  3  . docusate sodium (COLACE) 100 MG capsule Take 1 capsule (100 mg total) by mouth 2 (two) times daily. 10 capsule 0  . ferrous sulfate 325 (65 FE) MG tablet Take 325 mg by mouth 2 (two) times daily with a meal.    . hydrochlorothiazide (HYDRODIURIL) 12.5 MG tablet Take 12.5 mg by mouth daily.    Marland Kitchen ibuprofen (ADVIL,MOTRIN) 600 MG tablet 600 MG BY MOUTH EVERY 6 HOURS FOR 5 DAYS, THEN EVERY 6 HOURS AS NEEDED FOR PAIN.  DON'T TAKE MOBIC WHILE TAKING IBUPROFEN 60 tablet 0  . naproxen sodium (ALEVE) 220 MG tablet Take 220 mg by mouth as needed.    Marland Kitchen  Potassium (POTASSIMIN PO) Take by mouth.    . potassium chloride SA (K-DUR,KLOR-CON) 20 MEQ tablet Take 1 tablet (20 mEq total) by mouth daily. Take 1 tab twice daily for 3 days then take 1 tab daily 33 tablet 0  . sertraline (ZOLOFT) 50 MG tablet Take 1.5 tablets (75 mg total) by mouth at bedtime. 45 tablet 1  . Multiple Vitamin (MULTIVITAMIN) tablet Take 1 tablet by mouth daily.    . ondansetron (ZOFRAN) 4 MG tablet Take 1 tablet (4 mg total) by mouth every 8 (eight) hours as needed for nausea or vomiting. (Patient not taking: Reported on 11/28/2017) 20 tablet 0  . Vitamin D, Ergocalciferol, (DRISDOL) 50000 units CAPS capsule Take 50,000 Units by mouth every Monday.      No current facility-administered medications for this visit.     REVIEW OF SYSTEMS:   Constitutional: Denies fevers, chills, weight loss(+) months of generalized fatigue and body aches  Eyes: Denies blurriness of vision, double vision or watery eyes Ears, nose, mouth, throat, and face: Denies mucositis or sore throat Respiratory: Denies cough, dyspnea or wheezes Cardiovascular: Denies palpitation, chest discomfort or lower extremity swelling Gastrointestinal:  Denies nausea, vomiting, constipation, diarrhea, hematochezia, heartburn or change in  bowel habits Skin: Denies abnormal skin rashes Lymphatics: Denies new lymphadenopathy or easy bruising Neurological:Denies numbness, tingling or new weaknesses Behavioral/Psych: Mood is stable, no new changes   MSK: (+) OA related chronic joint pain R shoulder, hips, hands (+) low back pain  All other systems were reviewed with the patient and are negative.  PHYSICAL EXAMINATION: ECOG PERFORMANCE STATUS: 1 - Symptomatic but completely ambulatory  Vitals:   11/28/17 1420  BP: 119/76  Pulse: 80  Resp: 18  Temp: 98 F (36.7 C)  SpO2: 98%   Filed Weights   11/28/17 1420  Weight: 238 lb 8 oz (108.2 kg)    GENERAL:alert, no distress and comfortable SKIN: no rashes or significant lesions EYES: normal, conjunctiva are pink and non-injected, sclera clear OROPHARYNX:no thrush or ulcers   LYMPH:  no palpable cervical, supraclavicular, or axillary lymphadenopathy LUNGS: clear to auscultation with normal breathing effort HEART: regular rate & rhythm and no murmurs and no lower extremity edema ABDOMEN:abdomen soft, non-tender and normal bowel sounds Musculoskeletal:no cyanosis of digits and no clubbing  PSYCH: alert & oriented x 3 with fluent speech NEURO: no focal motor/sensory deficits  LABORATORY DATA:  I have reviewed the data as listed CBC Latest Ref Rng & Units 11/28/2017 11/13/2017 10/24/2017  WBC 3.9 - 10.3 K/uL 7.7 6.6 8.4  Hemoglobin 11.6 - 15.9 g/dL 11.0(L) 11.4(L) 11.1(L)  Hematocrit 34.8 - 46.6 % 33.6(L) 34.8 33.6(L)  Platelets 145 - 400 K/uL 222 229 244    CMP Latest Ref Rng & Units 11/28/2017 10/24/2017 09/05/2017  Glucose 70 - 99 mg/dL 90 101 94  BUN 6 - 20 mg/dL _0 Creatinine 0.44 - 1.00 mg/dL 0.97 0.81 0.85  Sodium 135 - 145 mmol/L 140 138 139  Potassium 3.5 - 5.1 mmol/L 3.3(L) 3.0(LL) 3.5  Chloride 98 - 111 mmol/L 101 99 100  CO2 22 - 32 mmol/L 32 27 32  Calcium 8.9 - 10.3 mg/dL 9.1 8.9 8.6  Total Protein 6.5 - 8.1 g/dL 7.9 8.0 6.9  Total Bilirubin 0.3 - 1.2  mg/dL 0.4 0.4 0.2  Alkaline Phos 38 - 126 U/L 81 76 -  AST 15 - 41 U/L _1 ALT 0 - 44 U/L _2 PATHOLOGY 11/13/2017  Bone Marrow Biopsy Surgical Pathology Results Diagnosis Bone Marrow, Aspirate,Biopsy, and Clot - PLASMA CELL NEOPLASM. - SEE COMMENT. PERIPHERAL BLOOD: - MICROCYTIC ANEMIA. Diagnosis Note The marrow reveals increased monoclonal plasma cells (6% by aspirate, 20% by CD138). Correlation with clinical, radiographic, and laboratory data are required for further classification. Vicente Males MD Pathologist, Electronic Signature (Case signed 11/15/2017) Specimen Clinical Information r/o multiple myeloma [rd] Source Bone Marrow, Aspirate,Biopsy, and Clot Microscopic LAB DATA: CBC performed on 11/13/2017 shows: WBC 6.6 K/ul Neutrophils 62% HB 11.4 g/dl Lymphocytes 33% HCT 34.8 % Monocytes 4% MCV 71.6 fL Eosinophils 1% RDW 16.3 % Basophils 0% PLT 229 K/ul PERIPHERAL BLOOD SMEAR: There is a microcytic anemia with occasional target cells and mild rouleaux formation. Leukocytes are present in normal numbers. Circulating plasma cells are not identified. Platelets are present in normal numbers. BONE MARROW ASPIRATE: Spicular and cellular. Erythroid precursors: Present in appropriate proportions. No significant dysplasia. Granulocytic precursors: Present in appropriate proportions. No significant dysplasia. No increase in blasts. Megakaryocytes: Present with occasional hypolobated forms. Lymphocytes/plasma cells: There is a mild increase in plasma cells (6% by manual differential counts) with atypical forms (large forms, prominent nucleoli). The distribution is patchy and some spicules have more plasma cells. Lymphocytes are not increased. TOUCH PREPARATIONS: Similar to aspirate smears. CLOT and BIOPSY: The core biopsy and clot section are normocellular for age (40%). CD138 highlights increased plasma cells (approximately 20%) with small aggregates. By light chain in  situ hybridization the plasma cells are kappa restricted. Myeloid and erythroid elements are present in normal proportions. There are scattered hypolobated megakaryocytes. There are not atypical lymphoid aggregates. IRON STAIN: Iron stains are performed on a bone marrow aspirate smear and section of clot. The controls stained appropriately. Storage Iron: Present. Ringed Sideroblasts: Absent. ADDITIONAL DATA / TESTING: Cytogenetics, including FISH for myeloma, was ordered. Specimen Table Bone Marrow count performed on 500 cells shows: Blasts: 0% Myeloid 54% Promyelocyts: 0% Myelocytes: 9% Erythroid 30% Metamyelocyts: 3% Bands: 16% Lymphocytes: 10% Neutrophils: 24% Eosinophils: 2% Plasma Cells: 6% Basophils: 0% Monocytes: 0% M:E ratio: 1.8 Gross Received in B plus is a 0.5 x 0.4 x 0.1 cm aggregate of tan red soft material, submitted in block A. Also received in B plus in a separate container is a 1.1 x .15 cm core of tan red firm tissue, submitted in block B following decalcification. (SSW:kh 11-13-17) Stain(s) Used in Diagnosis The following stain(s) were used in diagnosing the case: [place], CD 138, KAPPA-ISH. The control(s) stained Appropriately.  11/22/2017 [facility] Cytogenetic Pathology Result    RADIOGRAPHIC STUDIES:  11/06/2017 DG Bone Survey Met IMPRESSION: Single tiny lucency high in the parietal region of the skull, most likely a vascular channel. Otherwise, negative metastatic bone Survey.  I have personally reviewed the radiological images as listed and agreed with the findings in the report. Dg Bone Survey Met  Result Date: 11/06/2017 CLINICAL DATA:  Monoclonal gammopathy of unknown significance. EXAM: METASTATIC BONE SURVEY COMPARISON:  None. FINDINGS: Single 4 mm lucency high in the right parietal region. The skull otherwise appears normal. No other lytic lesions. Moderate arthritis of the right glenohumeral joint with slight arthritis of the left glenohumeral  joint. IMPRESSION: Single tiny lucency high in the parietal region of the skull, most likely a vascular channel. Otherwise, negative metastatic bone survey. Electronically Signed   By: Lorriane Shire M.D.   On: 11/06/2017 15:39    ASSESSMENT & PLAN:  Hannah [name] is a pleasant 57 year old AA Fwith chronic anemia and abnormal  SPEP and IFE.   1.  Smoldering multiple myeloma, IgA Kappa type -We reviewed her medical chart including labs and imaging in detail with the patient.  -Her lab test showed elevated M protein 1.2g/dl, elevated IgA level, light chain levels were within normal limits and ratio was slightly increased.  He does have mild anemia, likely related to iron deficiency, no renal failure, hypercalcemia, neuropathy, and her previous surgery was negative. -Discussed her bone marrow biopsy findings.  She has increased plasma (6% on aspirate, 20% by CD 138).  I discussed with pathologist Dr. Gari Crown, will review the report, the increased plasma cells in bone marrow is likely between 10 to 20%, and would favor monitoring multiple myeloma than MGUS -Also patient does not meet CRAB for MM, she does have low back and hip pain which started 6-12 months ago. I will obtain an pelvic and lumbar MRI for further evaluation to see if she has MM bone changes to further rule out MM -Reviewed the natural course of smoldering multiple myeloma, and the risk of transforming to multiple myeloma (5-10% yearly).  I recommend close follow-up by repeating lab every 3 months she agrees. -We discussed that current standard care for smoldering multiple myeloma is close clinical monitoring, although there are clinical trials to evaluate if earlier treatment is needed  -Discussed multiple myeloma related clinical symptoms, and what to watch, pt voiced good understanding. -I will see her back in 3 months.    2. Anemia, unspecified type  -She appears to have chronic mild microcytic anemia, Hgb 9.7 - 11, RDW is elevated;  likely a component of iron deficiency. She has been on oral iron 1 tab BID although not consistently.  Iron level showed a mild low transferrin saturation, serum iron and return levels are normal.  We discussed the benefits and potential side effect of IV around, she declined.  She would like to continue oral iron -I will check hemoglobin electrophoresis on next lab, to rule out thalassemia also.  3. HTN -controlled on chlorthalidone.  -followed by PCP   4. OA -She has imaging evidence consistent with OA in multiple joints. Pain currently controlled with NSAIDs and tylenol PRN.  -she was evaluated by rheumatology, will f/u there on PRN basis   5. Anxiety, depression -under care of [place] provider, stable and tolerating zoloft   6. Hypokalemia -likely secondary to diuretic; -she is on KCL supplement    PLAN: - continue iron pills -Pelvic and lumbar spine MRI w wo contrast in 2-3 weeks, I will call her after the scan  -F/u in 3 months with lab one week before     All questions were answered. The patient knows to call the clinic with any problems, questions or concerns. I spent 20 minutes counseling the patient face to face. The total time spent in the appointment was 30 minutes and more than 50% was on counseling.   Dierdre Searles Dweik am acting as scribe for Dr. Truitt Merle.  I have reviewed the above documentation for accuracy and completeness, and I agree with the above.    Truitt Merle, MD 11/28/2017

## 2017-11-24 ENCOUNTER — Other Ambulatory Visit (HOSPITAL_COMMUNITY): Payer: 59

## 2017-11-27 ENCOUNTER — Telehealth (HOSPITAL_COMMUNITY): Payer: Self-pay | Admitting: Psychiatry

## 2017-11-27 ENCOUNTER — Other Ambulatory Visit (HOSPITAL_COMMUNITY): Payer: PRIVATE HEALTH INSURANCE

## 2017-11-27 NOTE — Telephone Encounter (Signed)
Late entry: Peer review for [employer] was requested by Dr. Freda Munro, which was scheduled on 6/27. There was no call on scheduled time.   Left voice message today to call back and have discussion if needed. Dr. Freda Munro [phone] (consent form obtained to release information)

## 2017-11-27 NOTE — Telephone Encounter (Signed)
Discussed with Dr. Ebony Hail, who is a covering provider for Dr. Freda Munro for disability evaluation. Discussed the case with this provider.

## 2017-11-28 ENCOUNTER — Other Ambulatory Visit (HOSPITAL_COMMUNITY): Payer: PRIVATE HEALTH INSURANCE

## 2017-11-28 ENCOUNTER — Inpatient Hospital Stay (HOSPITAL_BASED_OUTPATIENT_CLINIC_OR_DEPARTMENT_OTHER): Payer: 59 | Admitting: Hematology

## 2017-11-28 ENCOUNTER — Inpatient Hospital Stay: Payer: 59 | Attending: Nurse Practitioner

## 2017-11-28 ENCOUNTER — Other Ambulatory Visit: Payer: Self-pay

## 2017-11-28 ENCOUNTER — Encounter: Payer: Self-pay | Admitting: Hematology

## 2017-11-28 VITALS — BP 119/76 | HR 80 | Temp 98.0°F | Resp 18 | Ht 66.0 in | Wt 238.5 lb

## 2017-11-28 DIAGNOSIS — D509 Iron deficiency anemia, unspecified: Secondary | ICD-10-CM

## 2017-11-28 DIAGNOSIS — F419 Anxiety disorder, unspecified: Secondary | ICD-10-CM

## 2017-11-28 DIAGNOSIS — F329 Major depressive disorder, single episode, unspecified: Secondary | ICD-10-CM | POA: Diagnosis not present

## 2017-11-28 DIAGNOSIS — C9000 Multiple myeloma not having achieved remission: Secondary | ICD-10-CM | POA: Diagnosis not present

## 2017-11-28 DIAGNOSIS — I10 Essential (primary) hypertension: Secondary | ICD-10-CM

## 2017-11-28 DIAGNOSIS — E876 Hypokalemia: Secondary | ICD-10-CM | POA: Diagnosis not present

## 2017-11-28 DIAGNOSIS — M1990 Unspecified osteoarthritis, unspecified site: Secondary | ICD-10-CM | POA: Diagnosis not present

## 2017-11-28 DIAGNOSIS — D472 Monoclonal gammopathy: Secondary | ICD-10-CM

## 2017-11-28 DIAGNOSIS — Z8049 Family history of malignant neoplasm of other genital organs: Secondary | ICD-10-CM

## 2017-11-28 LAB — CMP (CANCER CENTER ONLY)
ALBUMIN: 3.8 g/dL (ref 3.5–5.0)
ALT: 13 U/L (ref 0–44)
AST: 16 U/L (ref 15–41)
Alkaline Phosphatase: 81 U/L (ref 38–126)
Anion gap: 7 (ref 5–15)
BILIRUBIN TOTAL: 0.4 mg/dL (ref 0.3–1.2)
BUN: 18 mg/dL (ref 6–20)
CO2: 32 mmol/L (ref 22–32)
CREATININE: 0.97 mg/dL (ref 0.44–1.00)
Calcium: 9.1 mg/dL (ref 8.9–10.3)
Chloride: 101 mmol/L (ref 98–111)
GFR, Est AFR Am: >60 mL/min (ref >60)
GLUCOSE: 90 mg/dL (ref 70–99)
POTASSIUM: 3.3 mmol/L — AB (ref 3.5–5.1)
Sodium: 140 mmol/L (ref 135–145)
Total Protein: 7.9 g/dL (ref 6.5–8.1)

## 2017-11-28 LAB — CBC WITH DIFFERENTIAL (CANCER CENTER ONLY)
BASOS PCT: 0 %
Basophils Absolute: 0 10*3/uL (ref 0.0–0.1)
Eosinophils Absolute: 0.1 10*3/uL (ref 0.0–0.5)
Eosinophils Relative: 1 %
HEMATOCRIT: 33.6 % — AB (ref 34.8–46.6)
HEMOGLOBIN: 11 g/dL — AB (ref 11.6–15.9)
Lymphocytes Relative: 30 %
Lymphs Abs: 2.3 10*3/uL (ref 0.9–3.3)
MCH: 23.5 pg — ABNORMAL LOW (ref 25.1–34.0)
MCHC: 32.7 g/dL (ref 31.5–36.0)
MCV: 71.6 fL — ABNORMAL LOW (ref 79.5–101.0)
Monocytes Absolute: 0.3 10*3/uL (ref 0.1–0.9)
Monocytes Relative: 3 %
NEUTROS ABS: 5.1 10*3/uL (ref 1.5–6.5)
NEUTROS PCT: 66 %
Platelet Count: 222 10*3/uL (ref 145–400)
RBC: 4.69 MIL/uL (ref 3.70–5.45)
RDW: 15.7 % — ABNORMAL HIGH (ref 11.2–14.5)
WBC: 7.7 10*3/uL (ref 3.9–10.3)

## 2017-11-28 LAB — FERRITIN: Ferritin: 54 ng/mL (ref 11–307)

## 2017-11-28 NOTE — Addendum Note (Signed)
Addended by: Truitt Merle on: 11/28/2017 09:46 PM   Modules accepted: Orders

## 2017-12-05 ENCOUNTER — Ambulatory Visit (HOSPITAL_COMMUNITY): Payer: Self-pay | Admitting: Psychiatry

## 2017-12-05 NOTE — Progress Notes (Signed)
[facility] MD/PA/NP OP Progress Note  12/06/2017 8:35 AM Hannah [name]  MRN:  [id number]  Chief Complaint:  Chief Complaint    Follow-up; Depression     HPI:  Patient presents for follow-up appointment for depression.  She states that she has been feeling a little better.  She has just started to go to grief counseling.  She still misses her mother, although it has becoming less. She did have a few periods of feeling  isolative, hopeless and very anxious, thinking about her stresses; her father, who lives in two hours away and her work. However, she does want to try returning to work, working for half days. She has started to take a walk every day in the morning.  She denies insomnia after working on her sleep hygiene. She has fair concentration. She denies SI. She feels anxious, tense. She denies panic attacks. She feels a little drowsy after taking sertraline; denies significant residual effect in the morning.    Visit Diagnosis:    ICD-10-CM   1. MDD (major depressive disorder), recurrent episode, moderate ([place]) F33.1     Past Psychiatric History: Please see initial evaluation for full details. I have reviewed the history. No updates at this time.     Past Medical History:  Past Medical History:  Diagnosis Date  . Allergy   . Anemia   . Anxiety   . Arthritis   . Asthma   . Depression   . Hypertension     Past Surgical History:  Procedure Laterality Date  . CHOLECYSTECTOMY  1993  . COLONOSCOPY    . 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 TOTAL KNEE ARTHROPLASTY Right 01/28/2016   Procedure: RIGHT TOTAL KNEE ARTHROPLASTY;  Surgeon: [name], MD;  Location: [facility];  Service: Orthopedics;  Laterality: Right;  . TRIGGER FINGER RELEASE Right 06/18/2014   Procedure: RIGHT LONG FINGER TRIGGER RELEASE;  Surgeon: [name], MD;  Location: [facility];  Service: Orthopedics;  Laterality: Right;  . TUBAL LIGATION    . VAGINAL HYSTERECTOMY Bilateral 11/04/2016    Procedure: HYSTERECTOMY VAGINAL with Bilateral Salpingectomy;  Surgeon: [name], MD;  Location: [facility];  Service: Gynecology;  Laterality: Bilateral;    Family Psychiatric History: Please see initial evaluation for full details. I have reviewed the history. No updates at this time.     Family History:  Family History  Problem Relation Age of Onset  . Cancer Mother        uterine   . Asthma Mother   . Arthritis Daughter   . Depression Maternal Uncle   . Colon cancer Neg Hx     Social History:  Social History   Socioeconomic History  . Marital status: Married    Spouse name: Not on file  . Number of children: Not on file  . Years of education: Not on file  . Highest education level: Not on file  Occupational History  . Not on file  Social Needs  . Financial resource strain: Not hard at all  . Food insecurity:    Worry: Never true    Inability: Never true  . Transportation needs:    Medical: No    Non-medical: No  Tobacco Use  . Smoking status: Never Smoker  . Smokeless tobacco: Never Used  Substance and Sexual Activity  . Alcohol use: No    Alcohol/week: 0.0 oz  . Drug use: No  . Sexual activity: Yes    Partners: Male  Birth control/protection: Surgical  Lifestyle  . Physical activity:    Days per week: 0 days    Minutes per session: 0 min  . Stress: Very much  Relationships  . Social connections:    Talks on phone: More than three times a week    Gets together: Once a week    Attends religious service: More than 4 times per year    Active member of club or organization: Yes    Attends meetings of clubs or organizations: More than 4 times per year    Relationship status: Married  Other Topics Concern  . Not on file  Social History Narrative  . Not on file    Allergies:  Allergies  Allergen Reactions  . Sulfur Swelling    Facial swelling     Metabolic Disorder Labs: No results found for: HGBA1C, MPG No results found for:  PROLACTIN No results found for: CHOL, TRIG, HDL, CHOLHDL, VLDL, LDLCALC Lab Results  Component Value Date   TSH 1.14 09/05/2017    Therapeutic Level Labs: No results found for: LITHIUM No results found for: VALPROATE No components found for:  CBMZ  Current Medications: Current Outpatient Medications  Medication Sig Dispense Refill  . budesonide-formoterol (SYMBICORT) 160-4.5 MCG/ACT inhaler Inhale 2 puffs into the lungs daily as needed.    . chlorthalidone (HYGROTON) 25 MG tablet TAKE 1 TABLET BY MOUTH EVERY DAY FOR HTN  3  . docusate sodium (COLACE) 100 MG capsule Take 1 capsule (100 mg total) by mouth 2 (two) times daily. 10 capsule 0  . ferrous sulfate 325 (65 FE) MG tablet Take 325 mg by mouth 2 (two) times daily with a meal.    . hydrochlorothiazide (HYDRODIURIL) 12.5 MG tablet Take 12.5 mg by mouth daily.    Marland Kitchen ibuprofen (ADVIL,MOTRIN) 600 MG tablet 600 MG BY MOUTH EVERY 6 HOURS FOR 5 DAYS, THEN EVERY 6 HOURS AS NEEDED FOR PAIN.  DON'T TAKE MOBIC WHILE TAKING IBUPROFEN 60 tablet 0  . Multiple Vitamin (MULTIVITAMIN) tablet Take 1 tablet by mouth daily.    . naproxen sodium (ALEVE) 220 MG tablet Take 220 mg by mouth as needed.    . ondansetron (ZOFRAN) 4 MG tablet Take 1 tablet (4 mg total) by mouth every 8 (eight) hours as needed for nausea or vomiting. 20 tablet 0  . Potassium (POTASSIMIN PO) Take by mouth.    . potassium chloride SA (K-DUR,KLOR-CON) 20 MEQ tablet Take 1 tablet (20 mEq total) by mouth daily. Take 1 tab twice daily for 3 days then take 1 tab daily 33 tablet 0  . sertraline (ZOLOFT) 100 MG tablet Take 1 tablet (100 mg total) by mouth at bedtime. 30 tablet 0  . Vitamin D, Ergocalciferol, (DRISDOL) 50000 units CAPS capsule Take 50,000 Units by mouth every Monday.      No current facility-administered medications for this visit.      Musculoskeletal: Strength & Muscle Tone: within normal limits Gait & Station: normal Patient leans: N/A  Psychiatric Specialty  Exam: Review of Systems  Psychiatric/Behavioral: Positive for depression. Negative for hallucinations, memory loss, substance abuse and suicidal ideas. The patient is nervous/anxious. The patient does not have insomnia.   All other systems reviewed and are negative.   Blood pressure 118/81, pulse 66, height 5\' 6"  (1.676 m), weight 245 lb (111.1 kg), last menstrual period 10/22/2016, SpO2 94 %.Body mass index is 39.54 kg/m.  General Appearance: Fairly Groomed  Eye Contact:  Good  Speech:  Clear and Coherent  Volume:  Normal  Mood:  "better"  Affect:  Appropriate, Congruent, Restricted and improving  Thought Process:  Coherent  Orientation:  Full (Time, Place, and Person)  Thought Content: Logical   Suicidal Thoughts:  No  Homicidal Thoughts:  No  Memory:  Immediate;   Good  Judgement:  Good  Insight:  Fair  Psychomotor Activity:  Normal  Concentration:  Concentration: Good and Attention Span: Good  Recall:  Good  Fund of Knowledge: Good  Language: Good  Akathisia:  No  Handed:  Right  AIMS (if indicated): not done  Assets:  Communication Skills Desire for Improvement  ADL's:  Intact  Cognition: WNL  Sleep:  Fair   Screenings:   Assessment and Plan:  Hannah [name] is a 57 y.o. year old female with a history of depression, abnormal SPEP, OA,chronic anemia, hypertension, who presents for follow up appointment for MDD (major depressive disorder), recurrent episode, moderate ([place])  # MDD, moderate, recurrent without psychotic features Exam is notable for slightly improvement in restricted affect, and patient reports overall improvement in neurovegetative symptoms after up titration of sertraline.  Psychosocial stressors including loss of her mother, work environment and she is demoralized by current medical condition.  Will uptitrate sertraline to target residual neurovegetative symptoms.  Discussed behavioral activation. She will continue to see a therapist for grief.   In  regards to her work, she would like to start working half days. It is reasonable given her severity of her symptoms, which can be worsened easily in the stressful environment. If she relapses, it is difficult for her to effectively communicate with co-workers, customers or complete tasks. Will plan to send this note to company per patient request.   Plan 1. Increase sertraline 100 mg at night  2. Return to clinic in one month for 30 mins - Will plan to return to work on 7/15, half days. This note will be send to them per patient request.   The patient demonstrates the following risk factors for suicide: Chronic risk factors for suicide include: psychiatric disorder of depression and medical illness of OA. Acute risk factors for suicide include: N/A. Protective factors for this patient include: positive social support, responsibility to others (children, family), coping skills and hope for the future. Considering these factors, the overall suicide risk at this point appears to be low. Patient is appropriate for outpatient follow up.  The duration of this appointment visit was 30 minutes of face-to-face time with the patient.  Greater than 50% of this time was spent in counseling, explanation of  diagnosis, planning of further management, and coordination of care.  Norman Clay, MD 12/06/2017, 8:35 AM

## 2017-12-06 ENCOUNTER — Ambulatory Visit (HOSPITAL_COMMUNITY): Payer: Self-pay | Admitting: Psychiatry

## 2017-12-06 ENCOUNTER — Encounter (HOSPITAL_COMMUNITY): Payer: Self-pay | Admitting: Psychiatry

## 2017-12-06 ENCOUNTER — Ambulatory Visit (INDEPENDENT_AMBULATORY_CARE_PROVIDER_SITE_OTHER): Payer: 59 | Admitting: Psychiatry

## 2017-12-06 VITALS — BP 118/81 | HR 66 | Ht 66.0 in | Wt 245.0 lb

## 2017-12-06 DIAGNOSIS — F331 Major depressive disorder, recurrent, moderate: Secondary | ICD-10-CM | POA: Diagnosis not present

## 2017-12-06 MED ORDER — SERTRALINE HCL 100 MG PO TABS: 100.0000 mg | ORAL_TABLET | Freq: Every day | ORAL | 0 refills | Status: DC

## 2017-12-06 NOTE — Patient Instructions (Signed)
1. Increase sertraline 100 mg at night  2. Return to clinic in one month for 30 mins

## 2017-12-08 ENCOUNTER — Telehealth (HOSPITAL_COMMUNITY): Payer: Self-pay | Admitting: Psychiatry

## 2017-12-08 ENCOUNTER — Encounter (HOSPITAL_COMMUNITY): Payer: Self-pay | Admitting: Psychiatry

## 2017-12-08 NOTE — Telephone Encounter (Signed)
Received a call from the patient regarding the disability. She reports that she was told that this note writer told them that she can come back to work last month. Discussed with the patient that the information is incorrect. However, the company will make decision regarding her work. She will appeal this and may send another paperwork to the office.

## 2017-12-08 NOTE — Telephone Encounter (Signed)
This encounter was created in error - please disregard.

## 2017-12-13 ENCOUNTER — Telehealth (HOSPITAL_COMMUNITY): Payer: Self-pay | Admitting: *Deleted

## 2017-12-13 NOTE — Telephone Encounter (Signed)
Patient called stating that her short term disability was denied after the peer to peer took place. Per patient her case worker states that the call did not reflect the office visit notes. She is requesting that the MD fax paperwork to support her being off work as previously discussed  And to have documentation to support her office visit or to call and speak with case worker to give more information to support her case. Her case worker is Gildardo Griffes claim # is [id number]. Fax # [phone] or phone # [phone].

## 2017-12-13 NOTE — Telephone Encounter (Signed)
Discussed with Gae Bon, who is covering for [employer], [phone]. Discussed the case in length with Gae Bon. Please tell the patient that Gae Bon states that the disability is declined. If the patient would like to submit for appeal, she may do it. There are no other forms/notes for [place] to send to them at this time. Please remind the patient that they make decision regarding disability, but not [place]- we unfortunately will not be able to change that decision.

## 2017-12-14 ENCOUNTER — Ambulatory Visit (HOSPITAL_COMMUNITY)
Admission: RE | Admit: 2017-12-14 | Discharge: 2017-12-14 | Disposition: A | Discharge status: Home / Self Care | Payer: 59 | Source: Ambulatory Visit | Attending: Hematology | Admitting: Hematology

## 2017-12-14 DIAGNOSIS — M5127 Other intervertebral disc displacement, lumbosacral region: Secondary | ICD-10-CM | POA: Diagnosis not present

## 2017-12-14 DIAGNOSIS — C9000 Multiple myeloma not having achieved remission: Secondary | ICD-10-CM | POA: Diagnosis not present

## 2017-12-14 DIAGNOSIS — M5126 Other intervertebral disc displacement, lumbar region: Secondary | ICD-10-CM | POA: Diagnosis not present

## 2017-12-14 DIAGNOSIS — M47898 Other spondylosis, sacral and sacrococcygeal region: Secondary | ICD-10-CM | POA: Insufficient documentation

## 2017-12-14 DIAGNOSIS — D472 Monoclonal gammopathy: Secondary | ICD-10-CM

## 2017-12-14 DIAGNOSIS — Z90710 Acquired absence of both cervix and uterus: Secondary | ICD-10-CM | POA: Diagnosis not present

## 2017-12-14 DIAGNOSIS — M47896 Other spondylosis, lumbar region: Secondary | ICD-10-CM | POA: Insufficient documentation

## 2017-12-14 DIAGNOSIS — M47897 Other spondylosis, lumbosacral region: Secondary | ICD-10-CM | POA: Insufficient documentation

## 2017-12-14 MED ORDER — GADOBENATE DIMEGLUMINE 529 MG/ML IV SOLN
20.0000 mL | Freq: Once | INTRAVENOUS | Status: AC | PRN
  Administered 2017-12-14: 20 mL via INTRAVENOUS

## 2017-12-15 ENCOUNTER — Telehealth (HOSPITAL_COMMUNITY): Payer: Self-pay | Admitting: Professional

## 2017-12-19 ENCOUNTER — Ambulatory Visit (INDEPENDENT_AMBULATORY_CARE_PROVIDER_SITE_OTHER): Payer: PRIVATE HEALTH INSURANCE | Admitting: Psychiatry

## 2017-12-19 DIAGNOSIS — F331 Major depressive disorder, recurrent, moderate: Secondary | ICD-10-CM

## 2017-12-20 ENCOUNTER — Ambulatory Visit (INDEPENDENT_AMBULATORY_CARE_PROVIDER_SITE_OTHER): Payer: 59 | Admitting: Orthopaedic Surgery

## 2017-12-20 ENCOUNTER — Telehealth (HOSPITAL_COMMUNITY): Payer: Self-pay | Admitting: Professional

## 2017-12-20 ENCOUNTER — Ambulatory Visit (INDEPENDENT_AMBULATORY_CARE_PROVIDER_SITE_OTHER): Payer: Self-pay

## 2017-12-20 ENCOUNTER — Encounter (INDEPENDENT_AMBULATORY_CARE_PROVIDER_SITE_OTHER): Payer: Self-pay | Admitting: Orthopaedic Surgery

## 2017-12-20 ENCOUNTER — Other Ambulatory Visit (HOSPITAL_COMMUNITY): Payer: Self-pay | Admitting: Psychiatry

## 2017-12-20 DIAGNOSIS — G8929 Other chronic pain: Secondary | ICD-10-CM | POA: Insufficient documentation

## 2017-12-20 DIAGNOSIS — M19011 Primary osteoarthritis, right shoulder: Secondary | ICD-10-CM

## 2017-12-20 DIAGNOSIS — M25511 Pain in right shoulder: Secondary | ICD-10-CM

## 2017-12-20 MED ORDER — SERTRALINE HCL 100 MG PO TABS: 100.0000 mg | ORAL_TABLET | Freq: Every day | ORAL | 0 refills | Status: DC

## 2017-12-20 NOTE — Progress Notes (Signed)
Office Visit Note   Patient: Hannah [name]           Date of Birth: 1961/01/02           MRN: [id number] Visit Date: 12/20/2017              Requested by: [name], MD [address] [place], [place] [address] PCP: [name], MD   Assessment & Plan: Visit Diagnoses:  1. Primary osteoarthritis of right shoulder     Plan: Findings are consistent with primary osteoarthritis of the right shoulder.  She does have MRI from last year which does show some rotator cuff tendinosis but overall presentation is not consistent with rotator cuff arthropathy.  After long discussion it sounds like the patient is interested in discussing total shoulder replacement further with Dr. [name] who can provide her with more detai on the surgery itself and postoperative rehab and recovery.  Follow-Up Instructions: Return for Dr. [name] asap, Friday if possible.   Orders:  Orders Placed This Encounter  Procedures  . XR Shoulder Right   No orders of the defined types were placed in this encounter.     Procedures: No procedures performed   Clinical Data: No additional findings.   Subjective: Chief Complaint  Patient presents with  . Right Shoulder - Pain    HPI patient is a 57 year old female who presents our clinic today with recurrent right shoulder pain.  History of right shoulder glenohumeral osteoarthritis.  She has had this shoulder joint injected by Dr. [name] in the past.  Her first injection gave her significant relief, but her last injection did not help very much.  She also had a previous MRI from 02/17/2017 which showed marked glenohumeral changes, degenerated and torn superior and posterior labrum, moderate rotator cuff tendinopathy.  She has discussed replacement surgery in the past and would like to discuss it again today.  Review of Systems as detailed in HPI.  All others reviewed and are negative.   Objective: Vital Signs: LMP 10/22/2016 (Exact Date)   Physical  Exam well-developed and well-nourished female in no acute distress.  Alert and oriented x3.  Ortho Exam  Specialty Comments:  No specialty comments available.  Imaging: Xr Shoulder Right  Result Date: 12/20/2017 X-rays demonstrate severe glenohumeral degenerative changes with periarticular spurring    PMFS History: Patient Active Problem List   Diagnosis Date Noted  . Chronic right shoulder pain 12/20/2017  . 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 MDD (major depressive disorder), recurrent episode, moderate (Hannah [name]) 11/04/2017  . MGUS (monoclonal gammopathy of unknown significance) 10/24/2017  . Primary osteoarthritis of both hands 09/26/2017  . Primary osteoarthritis of right shoulder 09/26/2017  . Primary osteoarthritis of both hips 09/26/2017  . Status post total knee replacement, right 09/26/2017  . Primary osteoarthritis of both feet 09/26/2017  . History of asthma 09/26/2017  . Primary osteoarthritis of left knee 04/18/2017  . Menorrhagia 11/04/2016  . Fibroid tumor 11/04/2016  . Adenomyosis 11/04/2016  . Hypertension, essential 11/04/2016  . 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 Allergy   . Anemia   . Anxiety   . Arthritis   . Asthma   . Depression   . Hypertension     Family History  Problem Relation Age of Onset  . Cancer Mother        uterine   . Asthma Mother   . Arthritis Daughter   . Depression Maternal  Uncle   . Colon cancer Neg Hx     Past Surgical History:  Procedure Laterality Date  . CHOLECYSTECTOMY  1993  . COLONOSCOPY    . 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 TOTAL KNEE ARTHROPLASTY Right 01/28/2016   Procedure: RIGHT TOTAL KNEE ARTHROPLASTY;  Surgeon: [name], MD;  Location: [facility];  Service: Orthopedics;  Laterality: Right;  . TRIGGER FINGER RELEASE Right 06/18/2014   Procedure: RIGHT LONG FINGER TRIGGER RELEASE;  Surgeon: [name], MD;  Location: [facility];  Service: Orthopedics;  Laterality: Right;  . TUBAL LIGATION    . VAGINAL HYSTERECTOMY Bilateral 11/04/2016   Procedure: HYSTERECTOMY VAGINAL with Bilateral Salpingectomy;  Surgeon: [name], MD;  Location: [facility];  Service: Gynecology;  Laterality: Bilateral;   Social History   Occupational History  . Not on file  Tobacco Use  . Smoking status: Never Smoker  . Smokeless tobacco: Never Used  Substance and Sexual Activity  . Alcohol use: No    Alcohol/week: 0.0 oz  . Drug use: No  . Sexual activity: Yes    Partners: Male    Birth control/protection: Surgical

## 2017-12-21 ENCOUNTER — Other Ambulatory Visit (HOSPITAL_COMMUNITY): Payer: PRIVATE HEALTH INSURANCE | Attending: Psychiatry | Admitting: Licensed Clinical Social Worker

## 2017-12-21 DIAGNOSIS — M1990 Unspecified osteoarthritis, unspecified site: Secondary | ICD-10-CM | POA: Insufficient documentation

## 2017-12-21 DIAGNOSIS — Z9114 Patient's other noncompliance with medication regimen: Secondary | ICD-10-CM | POA: Diagnosis not present

## 2017-12-21 DIAGNOSIS — F331 Major depressive disorder, recurrent, moderate: Secondary | ICD-10-CM | POA: Diagnosis not present

## 2017-12-21 DIAGNOSIS — Z79899 Other long term (current) drug therapy: Secondary | ICD-10-CM | POA: Insufficient documentation

## 2017-12-21 DIAGNOSIS — R451 Restlessness and agitation: Secondary | ICD-10-CM | POA: Diagnosis not present

## 2017-12-21 DIAGNOSIS — Z90710 Acquired absence of both cervix and uterus: Secondary | ICD-10-CM | POA: Diagnosis not present

## 2017-12-21 DIAGNOSIS — Z8261 Family history of arthritis: Secondary | ICD-10-CM | POA: Insufficient documentation

## 2017-12-21 DIAGNOSIS — Z818 Family history of other mental and behavioral disorders: Secondary | ICD-10-CM | POA: Insufficient documentation

## 2017-12-21 DIAGNOSIS — Z9049 Acquired absence of other specified parts of digestive tract: Secondary | ICD-10-CM | POA: Insufficient documentation

## 2017-12-21 DIAGNOSIS — Z825 Family history of asthma and other chronic lower respiratory diseases: Secondary | ICD-10-CM | POA: Diagnosis not present

## 2017-12-21 DIAGNOSIS — Z8049 Family history of malignant neoplasm of other genital organs: Secondary | ICD-10-CM | POA: Insufficient documentation

## 2017-12-21 DIAGNOSIS — Z96651 Presence of right artificial knee joint: Secondary | ICD-10-CM | POA: Insufficient documentation

## 2017-12-21 DIAGNOSIS — Z9889 Other specified postprocedural states: Secondary | ICD-10-CM | POA: Diagnosis not present

## 2017-12-21 DIAGNOSIS — I10 Essential (primary) hypertension: Secondary | ICD-10-CM | POA: Diagnosis not present

## 2017-12-21 DIAGNOSIS — Z882 Allergy status to sulfonamides status: Secondary | ICD-10-CM | POA: Insufficient documentation

## 2017-12-21 DIAGNOSIS — J45909 Unspecified asthma, uncomplicated: Secondary | ICD-10-CM | POA: Insufficient documentation

## 2017-12-21 DIAGNOSIS — F079 Unspecified personality and behavioral disorder due to known physiological condition: Secondary | ICD-10-CM | POA: Diagnosis not present

## 2017-12-21 DIAGNOSIS — F419 Anxiety disorder, unspecified: Secondary | ICD-10-CM | POA: Insufficient documentation

## 2017-12-21 DIAGNOSIS — R4589 Other symptoms and signs involving emotional state: Secondary | ICD-10-CM | POA: Insufficient documentation

## 2017-12-21 NOTE — Progress Notes (Signed)
     THERAPIST PROGRESS NOTE  Session Time: 3:10-3:45  Participation Level: Active  Behavioral Response: CasualLethargicDepressed  Type of Therapy: Individual Therapy  Treatment Goals addressed: Coping  Interventions: CBT  Summary: Hannah [name] is a 57 y.o. female who presents with major depressive disorder.   Suicidal/Homicidal: Nowithout intent/plan  Therapist Response: Pt. Continues to present as lethargic, sad, tearful, complained of poor motivation to participate in family functions/responsibilities, and work requirements, difficulty engaging and focusing. Pt. Reported that her depression seems to be getting worse and that she does not feel that she has the coping skills to function at work or at home. Pt. Also discussed that she continues to be assessed for blood disorder which has caused her significant distress. Pt. Was referred for assessment for Partial Hospitalization program.  Plan: Pt to be assessed by [facility] program.  Diagnosis:      Major Depressive Episode, Moderate     Nancie Neas, [facility] 12/21/2017

## 2017-12-22 ENCOUNTER — Telehealth: Payer: Self-pay

## 2017-12-22 NOTE — Telephone Encounter (Signed)
Left voice message for patient with MRI results of lumbar spine and pelvis, no evidence of multiple myeloma, Dr. Burr Medico recommends follow up with PCP, but no concerns.

## 2017-12-22 NOTE — Telephone Encounter (Signed)
-----   Message from Wilmon Arms, RN sent at 12/21/2017  2:55 PM EDT -----   ----- Message ----- From: Truitt Merle, MD Sent: 12/21/2017  10:57 AM To: Wilmon Arms, RN  Please let pt know the MRI findings, let her f/u with PCP for her back pain and MRI findings, no concerns for MM. Thanks   Truitt Merle  12/21/2017

## 2017-12-25 ENCOUNTER — Encounter (HOSPITAL_COMMUNITY): Payer: Self-pay | Admitting: Occupational Therapy

## 2017-12-25 ENCOUNTER — Other Ambulatory Visit: Payer: Self-pay

## 2017-12-25 ENCOUNTER — Other Ambulatory Visit (HOSPITAL_COMMUNITY): Payer: PRIVATE HEALTH INSURANCE | Admitting: Occupational Therapy

## 2017-12-25 ENCOUNTER — Other Ambulatory Visit (HOSPITAL_COMMUNITY): Payer: PRIVATE HEALTH INSURANCE | Admitting: Licensed Clinical Social Worker

## 2017-12-25 ENCOUNTER — Encounter (HOSPITAL_COMMUNITY): Payer: Self-pay | Admitting: Professional

## 2017-12-25 VITALS — BP 122/74 | HR 72 | Ht 66.0 in | Wt 245.0 lb

## 2017-12-25 DIAGNOSIS — F331 Major depressive disorder, recurrent, moderate: Secondary | ICD-10-CM | POA: Diagnosis not present

## 2017-12-25 DIAGNOSIS — R4589 Other symptoms and signs involving emotional state: Secondary | ICD-10-CM

## 2017-12-25 DIAGNOSIS — F079 Unspecified personality and behavioral disorder due to known physiological condition: Secondary | ICD-10-CM

## 2017-12-25 NOTE — Psych (Signed)
Comprehensive Clinical Assessment (CCA) Note  12/25/2017 Hannah [name] [id number]  Visit Diagnosis:      ICD-10-CM   1. MDD (major depressive disorder), recurrent episode, moderate ([place]) F33.1       CCA Part One  Part One has been completed on paper by the patient.  (See scanned document in Chart Review)  CCA Part Two A  Intake/Chief Complaint:  CCA Intake With Chief Complaint CCA Part Two Date: 12/21/17 CCA Part Two Time: 31 Chief Complaint/Presenting Problem: This is a 57 yr old, married, employed, [place] American female; who was referred per counselor, Eloise Levels; treatment for worsening depressive symptoms with anxiety.  Admits to passive SI.  Pt denies a plan or intent/means.  Discussed safety options with pt.  Pt able to contract for safety.  Denies any prior attempts or gestures.  Denies inpt hospitalizations.  Stressors:  1)  Grief/Loss Issues:  Two yrs ago mother passed d/t uterine cancer.  Father resides two hrs away and pt assists him when needed;  Pt reports 2 unexpected deaths of co-workers in the past 2 weeks. Pt reports she is struggling with her grief and constantly wondering "am I going to die next?"  2)  Job ([employer]) of sixteen yrs.  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  Reports being written up due to forgetting to say things on the phone to customers.  Plans to explore other job options.  Pt reports she has been taking days off due to "being unable to get out of bed." Family hx:  Maternal Uncle (Depression)                                                          Patients Currently Reported Symptoms/Problems: increased depression/anxiety; passive SI; Sadness, tearfulness, poor concentrating, agitated, ruminating thoughts, anxious, poor sleep (awakenings), no energy, anhedonia, isolative, flucuating appetite Collateral Involvement: Reports her church, family and friends are supportive. Individual's Strengths: Pt is motivated for  treatment.  Mental Health Symptoms Depression:  Depression: Change in energy/activity, Difficulty Concentrating, Increase/decrease in appetite, Irritability, Sleep (too much or little), Tearfulness, Hopelessness, Fatigue  Mania:  Mania: N/A  Anxiety:   Anxiety: Worrying  Psychosis:  Psychosis: N/A  Trauma:  Trauma: N/A  Obsessions:  Obsessions: N/A  Compulsions:  Compulsions: N/A  Inattention:  Inattention: N/A  Hyperactivity/Impulsivity:  Hyperactivity/Impulsivity: N/A  Oppositional/Defiant Behaviors:  Oppositional/Defiant Behaviors: N/A  Borderline Personality:  Emotional Irregularity: N/A  Other Mood/Personality Symptoms:      Mental Status Exam Appearance and self-care  Stature:  Stature: Average  Weight:  Weight: Overweight  Clothing:  Clothing: Casual  Grooming:  Grooming: Normal  Cosmetic use:  Cosmetic Use: None  Posture/gait:  Posture/Gait: Normal  Motor activity:  Motor Activity: Not Remarkable  Sensorium  Attention:  Attention: Normal  Concentration:  Concentration: Normal  Orientation:  Orientation: X5  Recall/memory:  Recall/Memory: Normal  Affect and Mood  Affect:  Affect: Depressed  Mood:  Mood: Depressed  Relating  Eye contact:  Eye Contact: Normal  Facial expression:  Facial Expression: Sad  Attitude toward examiner:  Attitude Toward Examiner: Cooperative  Thought and Language  Speech flow: Speech Flow: Normal  Thought content:  Thought Content: Appropriate to mood and circumstances  Preoccupation:     Hallucinations:  Organization:     Transport planner of Knowledge:  Fund of Knowledge: Average  Intelligence:  Intelligence: Average  Abstraction:  Abstraction: Normal  Judgement:  Judgement: Normal  Reality Testing:  Reality Testing: Adequate  Insight:  Insight: Good  Decision Making:  Decision Making: Normal  Social Functioning  Social Maturity:  Social Maturity: Isolates  Social Judgement:  Social Judgement: Normal  Stress   Stressors:  Stressors: Brewing technologist, Chiropodist, Work, Illness  Coping Ability:  Coping Ability: Research officer, political party Deficits:     Supports:      Family and Psychosocial History: Family history Marital status: Married Number of Years Married: 25 What types of issues is patient dealing with in the relationship?: states husband is supportive Additional relationship information: Pt reports she does not share much about her mental health with husband because she does not want to worry him. Does patient have children?: Yes How many children?: 3 How is patient's relationship with their children?: 27 yr old and 78 yr old sons (both in the home); 104 yr old daughter  Childhood History:  Childhood History By whom was/is the patient raised?: Both parents Additional childhood history information: Born in [place].  States childhood was good.  "I was a mouthy child, so my mom kept me busy in activities."  Denies trauma or abuse. Description of patient's relationship with caregiver when they were a child: "Good" Patient's description of current relationship with people who raised him/her: Close to father. Mother deceased Does patient have siblings?: Yes Number of Siblings: 2 Description of patient's current relationship with siblings: Two younger sisters. Did patient suffer any verbal/emotional/physical/sexual abuse as a child?: No Did patient suffer from severe childhood neglect?: No Has patient ever been sexually abused/assaulted/raped as an adolescent or adult?: No Was the patient ever a victim of a crime or a disaster?: No Witnessed domestic violence?: No Has patient been effected by domestic violence as an adult?: No  CCA Part Two B  Employment/Work Situation: Employment / Work Copywriter, advertising Employment situation: Employed Where is patient currently employed?: [employer] How long has patient been employed?: 16 yrs Patient's job has been impacted by current illness: Yes Describe how patient's job has been  impacted: Decreased functioning at work; taking work home to complete Did You Receive Any Psychiatric Treatment/Services While in Passenger transport manager?: No Are There Guns or Other Weapons in [place]?: No  Education: Education Did Teacher, adult education From [employer]?: Yes Did Physicist, medical?: Yes What Type of College Degree Do you Have?: Business ED in IT Did [place]?: No Did You Have An Individualized Education Program (IIEP): No Did You Have Any Difficulty At [employer]?: (States she's dx with ADD)  Religion: Religion/Spirituality Are You A Religious Person?: Yes What is Your Religious Affiliation?: Pentecostal  Leisure/Recreation: Leisure / Recreation Leisure and Hobbies: Shopping at [employer]; performing arts with kids at [employer]  Exercise/Diet: Exercise/Diet Do You Exercise?: No Have You Gained or Lost A Significant Amount of Weight in the Past Six Months?: No Do You Follow a Special Diet?: No Do You Have Any Trouble Sleeping?: Yes Explanation of Sleeping Difficulties: awakenings  CCA Part Two C  Alcohol/Drug Use: Alcohol / Drug Use Pain Medications: see MAR Prescriptions: see MAR Over the Counter: see MAR History of alcohol / drug use?: No history of alcohol / drug abuse  CCA Part Three  ASAM's:  Six Dimensions of Multidimensional Assessment  Dimension 1:  Acute Intoxication and/or Withdrawal Potential:  Dimension 2:  Biomedical Conditions and Complications:     Dimension 3:  Emotional, Behavioral, or Cognitive Conditions and Complications:     Dimension 4:  Readiness to Change:     Dimension 5:  Relapse, Continued use, or Continued Problem Potential:     Dimension 6:  Recovery/Living Environment:      Substance use Disorder (SUD)    Social Function:  Social Functioning Social Maturity: Isolates Social Judgement: Normal  Stress:  Stress Stressors: Grief/losses, Chiropodist, Work, Illness Coping Ability: Exhausted Patient Takes Medications The Way  The Doctor Instructed?: Yes Priority Risk: Moderate Risk  Risk Assessment- Self-Harm Potential: Risk Assessment For Self-Harm Potential Thoughts of Self-Harm: Vague current thoughts Method: No plan Additional Comments for Self-Harm Potential: Pt reports daily passive SI. Pt states "I'm always worrying about if I will be the next one that needs a memorial book at work."  Risk Assessment -Dangerous to Others Potential: Risk Assessment For Dangerous to Others Potential Method: No Plan  DSM5 Diagnoses: Patient Active Problem List   Diagnosis Date Noted  . Chronic right shoulder pain 12/20/2017  . 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 MDD (major depressive disorder), recurrent episode, moderate ([place]) 11/04/2017  . MGUS (monoclonal gammopathy of unknown significance) 10/24/2017  . Primary osteoarthritis of both hands 09/26/2017  . Primary osteoarthritis of right shoulder 09/26/2017  . Primary osteoarthritis of both hips 09/26/2017  . Status post total knee replacement, right 09/26/2017  . Primary osteoarthritis of both feet 09/26/2017  . History of asthma 09/26/2017  . Primary osteoarthritis of left knee 04/18/2017  . Menorrhagia 11/04/2016  . Fibroid tumor 11/04/2016  . Adenomyosis 11/04/2016  . Hypertension, essential 11/04/2016  . Total knee replacement status 01/28/2016    Patient Centered Plan: Patient is on the following Treatment Plan(s):  Depression  Recommendations for Services/Supports/Treatments: Recommendations for Services/Supports/Treatments Recommendations For Services/Supports/Treatments: Partial Hospitalization(Pt referred to [facility] per counselor, Eloise Levels. Pt reports daily passive SI. Pt is unable to function at work due to symptoms. Pt needs to gain coping skills to help manage passive SI, depression, anxiety symptoms.)  Treatment Plan Summary:  Pt reports, "I want tools to manage this. Right now I don't have any tools."  Referrals to Alternative  Service(s): Referred to Alternative Service(s):   Place:   Date:   Time:    Referred to Alternative Service(s):   Place:   Date:   Time:    Referred to Alternative Service(s):   Place:   Date:   Time:    Referred to Alternative Service(s):   Place:   Date:   Time:     Maitland Lesiak J Margarette Vannatter, LPCA. LCASA

## 2017-12-25 NOTE — Progress Notes (Signed)
Patient presents with flat affect, depressed mood but denied any suicidal or homicidal ideations, no auditory or visual hallucinations and no plan or intent to harm self or others. Patient reported no past attempts to harm self or others but acknowledged after loosing her Mother to cancer 2 years previous, then learning her uncle has stage 4 lung cancer and her own concern for being tested recently for cancer, she has been more depressed and having problems sleeping.  Patient admitted she has never taken the Zoloft prescribed to her accurately and none over the past 2 weeks.  Discussed this as a concern and helped explain to patient how medication works and risks and benefits.  Patient reported being on edge more recently too and anxious so plans to restart Zoloft at 50 mg, one a day and will discuss with nurse practitioner regarding how she should titrate it back up of if should be tried on another medication.  Encouraged patient to also discuss sleep concerns if continues to only sleep 4-5 hours a night.  Patient rated her current level of depression a 10, anxiety a 9 and hopelessness a 9 on a scale of 0-10 with 0 being none and 10 the worst she could manage.  Patient reported desire to help her better manage ruminating thoughts as reports at times difficulty shutting down her mind.  Admits this occurs due to her own fears about death and recent losses.  Patient reported poor appetite but does "eat for comfort".  Patient again denied any suicidal ideations or plan or intent to harm self or others and agreed to discuss medications with Ricky Ala, NP due to concerns for potential side effects.  Patient agreed to contact this nurse or PHP staff if any worsening of symptoms or problems with medications. Patient plans to restart Zoloft this evening and will let our staff know if any problems with this.

## 2017-12-25 NOTE — Therapy (Signed)
[facility] [place], [place], [address] Phone: [phone]   Fax:  [phone]  Occupational Therapy Evaluation  Patient Details  Name: Hannah [name] MRN: [id number] Date of Birth: 1960/10/10 Referring Provider: Ricky Ala, NP   Encounter Date: 12/25/2017  OT End of Session - 12/25/17 1548    Visit Number  1    Number of Visits  16    Date for OT Re-Evaluation  01/22/18    OT Start Time  1115    OT Stop Time  1245    OT Time Calculation (min)  90 min    Activity Tolerance  Patient tolerated treatment well    Behavior During Therapy  [facility] for tasks assessed/performed       Past Medical History:  Diagnosis Date  . Allergy   . Anemia   . Anxiety   . Arthritis   . Asthma   . Depression   . Hypertension     Past Surgical History:  Procedure Laterality Date  . CHOLECYSTECTOMY  1993  . COLONOSCOPY    . 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 TOTAL KNEE ARTHROPLASTY Right 01/28/2016   Procedure: RIGHT TOTAL KNEE ARTHROPLASTY;  Surgeon: [name], MD;  Location: [facility];  Service: Orthopedics;  Laterality: Right;  . TRIGGER FINGER RELEASE Right 06/18/2014   Procedure: RIGHT LONG FINGER TRIGGER RELEASE;  Surgeon: [name], MD;  Location: [facility];  Service: Orthopedics;  Laterality: Right;  . TUBAL LIGATION    . VAGINAL HYSTERECTOMY Bilateral 11/04/2016   Procedure: HYSTERECTOMY VAGINAL with Bilateral Salpingectomy;  Surgeon: [name], MD;  Location: [facility];  Service: Gynecology;  Laterality: Bilateral;    There were no vitals filed for this visit.  Subjective Assessment - 12/25/17 1543    Currently in Pain?  Other (Comment) OA pain        [facility] OT Assessment - 12/25/17 0001      Assessment   Medical Diagnosis  Major Depressive Disorder, moderate    Referring Provider  Ricky Ala, NP    Onset Date/Surgical Date  12/25/17      Balance Screen   Has the  patient fallen in the past 6 months  No    Has the patient had a decrease in activity level because of a fear of falling?   No    Is the patient reluctant to leave their home because of a fear of falling?   No        OT assessment: OCAIRS  Diagnosis: MDD moderate  Past medical history/referral information: Pt presents to PHP after previous admin to IOP with increased SI.  Living situation: Pt lives with husband and 34 year old son  ADLs/IADLs: Pt reports decreased motivation to complete IADL/ADL activity  Work: Pt works for [employer], she reports the work place is minimally supportive  Leisure: Pt identifies no leisure activities, but is very involved in volunteering for church and school  Social support: Pt identifies husband and children as supports  OT goal: To learn coping skills to regain functional independence  [place] Summary of Client Scores:  FACILITATES PARTICIPATION IN OCCUPATION  ALLOWS PARTICIPATION IN OCCUPATION INHIBITS PARTICIPATION IN OCCUPATION RESTRICTS PARTICIPATION IN OCCUPATION COMMENTS  ROLES              X    HABITS              X  PERSONAL CAUSATION              X    VALUES              X    INTERESTS             X     SKILLS             X     SHORT TERM GOALS              X    LONG TERM GOALS              X    INTERPETATION OF PAST EXPERIENCES             X     PHYSICAL ENVIRONMENT             X     SOCIAL ENVIRONMENT              X    READINESS FOR CHANGE              X      Need for Occupational Therapy:  4 Shows positive occupational participation, no need for OT.   3 Need for minimal intervention/consultative participation    X 2 Need for OT intervention indicated to restore/improve participation   1 Need for extensive OT intervention indicated to improve participation.  Referral for follow up services also recommended.    Assessment:  Patient demonstrates behavior that inhibits participation in occupation.   Patient will benefit from occupational therapy intervention in order to improve time management, financial management, stress management, job readiness skills, social skills, and health management skills in preparation to return to full time community living and to be a productive community member.    Plan:  Patient will participate in skilled occupational therapy sessions individually or in a group setting to improve coping skills, psychosocial skills, and emotional skills required to return to prior level of function. Treatment will be 4-5 times per week for 3 weeks.     OT Treatment  S: "I need to work on my physical"  O: Education given on protective factors and their importance in building resiliency to face difficult life challenges. Protective factors worksheet completed. Pt to rate current protective factors of social support, coping skills, physical health, sense of purpose, self-esteem, and healthy thinking on a scale from weak-moderate-strong. Pt then to identify the most valuable protective factor, 2 protective factors to improve, and specific goals to accomplish this task.   A: Pt presents to group with flat affect, engaged and offering examples with other group members and facilitator. Pt completed protective factors worksheet, identifying that she scores close to weak on every protective factor except social support. Pt made a goal to work on physical health by increasing activity and coping skills this date.   P: Education given on protective factors and goal setting using art as therapeutic tool. OT will continue follow up with pt to ensure successful implementation in daily life.              OT Education - 12/25/17 1547    Education Details  education given on protective factors     Person(s) Educated  Patient    Methods  Explanation;Handout    Comprehension  Verbalized understanding       OT Short Term Goals - 12/25/17 1552      OT SHORT TERM GOAL #1   Title   Pt  will apply psychosocial skills and coping mechanisms to daily activities in order to function independently and reintegrate into community dwelling    Time  4    Period  Weeks    Status  New    Target Date  01/22/18      OT SHORT TERM GOAL #2   Title  Pt will be educated on strategies to improve psychosocial skills needed to participate fully in all daily, work, and leisure activities    Time  4    Period  Weeks    Status  New      OT SHORT TERM GOAL #3   Title  Pt will be educated and independent with HEP implementation for improved coping strategies    Time  4    Period  Weeks    Status  New               Plan - 12/25/17 1550    Occupational performance deficits (Please refer to evaluation for details):  ADL's;IADL's;Rest and Sleep;Work;Leisure;Social Participation    Rehab Potential  Good    OT Frequency  5x / week    OT Duration  4 weeks    OT Treatment/Interventions  Psychosocial skills training;Coping strategies training;Self-care/ADL training;Other (comment) community reintegration    Consulted and Agree with Plan of Care  Patient       Patient will benefit from skilled therapeutic intervention in order to improve the following deficits and impairments:  Decreased coping skills, Decreased psychosocial skills, Other (comment)(decreased ability to engage in BADL and reintegrate into the comunity)  Visit Diagnosis: 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 Chronic right shoulder pain 12/20/2017  . 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 MDD (major depressive disorder), recurrent episode, moderate ([place]) 11/04/2017  . MGUS (monoclonal gammopathy of unknown significance) 10/24/2017  . Primary osteoarthritis of both hands 09/26/2017  . Primary osteoarthritis of  right shoulder 09/26/2017  . Primary osteoarthritis of both hips 09/26/2017  . Status post total knee replacement, right 09/26/2017  . Primary osteoarthritis of both feet 09/26/2017  . History of asthma 09/26/2017  . Primary osteoarthritis of left knee 04/18/2017  . Menorrhagia 11/04/2016  . Fibroid tumor 11/04/2016  . Adenomyosis 11/04/2016  . Hypertension, essential 11/04/2016  . Total knee replacement status 01/28/2016   Zenovia Jarred, MSOT, OTR/L  [place] 12/25/2017, 3:55 PM  [facility] PARTIAL HOSPITALIZATION PROGRAM [place], [place], [address] Phone: [phone]   Fax:  [phone]  Name: Hannah [name] MRN: [id number] Date of Birth: [birth year]

## 2017-12-26 ENCOUNTER — Other Ambulatory Visit (HOSPITAL_COMMUNITY): Payer: PRIVATE HEALTH INSURANCE | Admitting: Licensed Clinical Social Worker

## 2017-12-26 ENCOUNTER — Encounter (HOSPITAL_COMMUNITY): Payer: Self-pay | Admitting: Occupational Therapy

## 2017-12-26 ENCOUNTER — Encounter (HOSPITAL_COMMUNITY): Payer: Self-pay | Admitting: Family

## 2017-12-26 ENCOUNTER — Other Ambulatory Visit (HOSPITAL_COMMUNITY): Payer: PRIVATE HEALTH INSURANCE | Admitting: Occupational Therapy

## 2017-12-26 DIAGNOSIS — F331 Major depressive disorder, recurrent, moderate: Secondary | ICD-10-CM | POA: Diagnosis not present

## 2017-12-26 DIAGNOSIS — F079 Unspecified personality and behavioral disorder due to known physiological condition: Secondary | ICD-10-CM

## 2017-12-26 DIAGNOSIS — R4589 Other symptoms and signs involving emotional state: Secondary | ICD-10-CM

## 2017-12-26 MED ORDER — SERTRALINE HCL 50 MG PO TABS: 50.0000 mg | ORAL_TABLET | Freq: Every day | ORAL | 0 refills | Status: DC

## 2017-12-26 MED ORDER — TRAZODONE HCL 50 MG PO TABS: 50.0000 mg | ORAL_TABLET | Freq: Every day | ORAL | 0 refills | Status: DC

## 2017-12-26 NOTE — Progress Notes (Signed)
Behavioral Health Partial Program Assessment Note  Date: 12/26/2017 Name: Hannah [name] MRN: [id number]  Chief Complaint: Worsening depression with mood irritability     HPI: Patient is a 57 y.o. African American female presents with worsening depression and mood irritability.  Patient was recently discharged from intensive outpatient hospitalization and was to follow-up with primary psychiatrist.  Patient was prescribed sertraline 150 mg at the time of discharge.  However reports she has not been taking her medications as prescribed.  Reports a fear of medication side effects and reports she felt that she was doing better without the medications.  Reports after going back to work she noticed her mood and a consultation have been getting increasingly worse.  Reports racing thoughts, and ongoing grief related to death and dying.  [place] reports her own physical health has been declining and she has fears that she could possibly be diagnosed with cancer.  Denies suicidal or homicidal ideation.  Denies auditory or  visual hallucinations.  Patient reports a poor appetite continues to report job related stressors.  Reports during her previous admission to the intensive outpatient program patient had a "peer to to peer' review how however her disability was denied.  patient was enrolled in partial psychiatric program on 12/26/17.  Primary complaints include: agitation, depression worse, difficulty sleeping, feeling depressed and poor concentration.  Onset of symptoms was gradual with gradually worsening course since that time. Psychosocial Stressors include the following: family, financial and occupational.   I have reviewed the following documentation dated 12/26/2017: past psychiatric history, past medical history, past social and family history and past Review of systems  Complaints of Pain: location: Generalized Past Psychiatric History:  Previous admission to intensive outpatient program.  Patient  is followed by Dr. Modesta Messing but she is prescribed Zoloft 150 mg p.o. daily  Currently in treatment with Zoloft  Substance Abuse History: none Use of Alcohol: denied Use of Caffeine: denies use Use of over the counter:   Past Surgical History:  Procedure Laterality Date  . CHOLECYSTECTOMY  1993  . COLONOSCOPY    . 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 TOTAL KNEE ARTHROPLASTY Right 01/28/2016   Procedure: RIGHT TOTAL KNEE ARTHROPLASTY;  Surgeon: [name], MD;  Location: [facility];  Service: Orthopedics;  Laterality: Right;  . TRIGGER FINGER RELEASE Right 06/18/2014   Procedure: RIGHT LONG FINGER TRIGGER RELEASE;  Surgeon: [name], MD;  Location: [facility];  Service: Orthopedics;  Laterality: Right;  . TUBAL LIGATION    . VAGINAL HYSTERECTOMY Bilateral 11/04/2016   Procedure: HYSTERECTOMY VAGINAL with Bilateral Salpingectomy;  Surgeon: [name], MD;  Location: [facility];  Service: Gynecology;  Laterality: Bilateral;    Past Medical History:  Diagnosis Date  . Allergy   . Anemia   . Anxiety   . Arthritis   . Asthma   . Depression   . Hypertension    Outpatient Encounter Medications as of 12/26/2017  Medication Sig Note  . budesonide-formoterol (SYMBICORT) 160-4.5 MCG/ACT inhaler Inhale 2 puffs into the lungs daily as needed.   . chlorthalidone (HYGROTON) 25 MG tablet TAKE 1 TABLET BY MOUTH EVERY DAY FOR HTN   . docusate sodium (COLACE) 100 MG capsule Take 1 capsule (100 mg total) by mouth 2 (two) times daily. (Patient not taking: Reported on 12/25/2017)   . ferrous sulfate 325 (65 FE) MG tablet Take 325 mg by mouth 2 (two) times daily with a meal.   . hydrochlorothiazide (HYDRODIURIL) 12.5 MG tablet  Take 12.5 mg by mouth daily.   Marland Kitchen ibuprofen (ADVIL,MOTRIN) 600 MG tablet 600 MG BY MOUTH EVERY 6 HOURS FOR 5 DAYS, THEN EVERY 6 HOURS AS NEEDED FOR PAIN.  DON'T TAKE MOBIC WHILE TAKING IBUPROFEN   . Multiple Vitamin (MULTIVITAMIN) tablet Take 1 tablet by  mouth daily.   . naproxen sodium (ALEVE) 220 MG tablet Take 220 mg by mouth as needed.   . ondansetron (ZOFRAN) 4 MG tablet Take 1 tablet (4 mg total) by mouth every 8 (eight) hours as needed for nausea or vomiting. (Patient not taking: Reported on 12/25/2017)   . Potassium (POTASSIMIN PO) Take by mouth.   . potassium chloride SA (K-DUR,KLOR-CON) 20 MEQ tablet Take 1 tablet (20 mEq total) by mouth daily. Take 1 tab twice daily for 3 days then take 1 tab daily (Patient not taking: Reported on 12/25/2017)   . sertraline (ZOLOFT) 100 MG tablet Take 1 tablet (100 mg total) by mouth at bedtime.   . Vitamin D, Ergocalciferol, (DRISDOL) 50000 units CAPS capsule Take 50,000 Units by mouth every Monday.  10/31/2016: Takes on Monday   No facility-administered encounter medications on file as of 12/26/2017.    Allergies  Allergen Reactions  . Sulfur Swelling    Facial swelling     Social History   Tobacco Use  . Smoking status: Never Smoker  . Smokeless tobacco: Never Used  Substance Use Topics  . Alcohol use: No    Alcohol/week: 0.0 oz   Functioning Relationships: good support system, gets along well with co-workers and good relationship with children, church family supportive Education:  Other Pertinent History:  Family History  Problem Relation Age of Onset  . Cancer Mother        uterine   . Asthma Mother   . Arthritis Daughter   . Depression Maternal Uncle   . Colon cancer Neg Hx      Review of Systems Constitutional: negative  Objective:  There were no vitals filed for this visit.  Physical Exam:   Mental Status Exam: Appearance:  Well groomed Psychomotor::  Within Normal Limits Attention span and concentration: Normal Behavior: calm and cooperative Speech:  loud Mood:  depressed and anxious Affect:  normal Thought Process:  Coherent Thought Content:  WDL Orientation:  person, place and time/date Cognition:  grossly intact Insight:  Fair Judgment:  Fair Estimate of  Intelligence: Average Fund of knowledge: Aware of current events Memory: Recent and remote intact Abnormal movements: None Gait and station: Normal  Assessment:  Diagnosis: MDD (major depressive disorder), recurrent episode, moderate ([place]) [F33.1] 1. MDD (major depressive disorder), recurrent episode, moderate ([place])     Indications for admission: Worsening depression  Plan: Orders placed for occupational therapy OT patient enrolled in Partial Hospitalization Program, patient's current medications are to be continued, the following medications are being prescribed trazodone and Zoloft a comprehensive treatment plan will be developed and side effects of medications have been reviewed with patient  Restarted sertraline 50 mg x 4 days will titrate to 100 mg  initiated trazodone 50 mg PO PRN for insomnia   Treatment options and alternatives reviewed with patient and patient understands the above plan.  Treatment plan was reviewed and agreed upon by NP., T .Bobby Rumpf and patient Nena Alexander for continued group.   Derrill Center, NP

## 2017-12-26 NOTE — Therapy (Signed)
[facility] [place], [place], [address] Phone: [phone]   Fax:  [phone]  Occupational Therapy Treatment  Patient Details  Name: Hannah [name] MRN: [id number] Date of Birth: 23-Mar-1961 Referring Provider: Ricky Ala, NP   Encounter Date: 12/26/2017  OT End of Session - 12/26/17 1332    Visit Number  2    Number of Visits  16    Date for OT Re-Evaluation  01/22/18    OT Start Time  1100    OT Stop Time  1200    OT Time Calculation (min)  60 min    Activity Tolerance  Patient tolerated treatment well    Behavior During Therapy  [facility] for tasks assessed/performed       Past Medical History:  Diagnosis Date  . Allergy   . Anemia   . Anxiety   . Arthritis   . Asthma   . Depression   . Hypertension     Past Surgical History:  Procedure Laterality Date  . CHOLECYSTECTOMY  1993  . COLONOSCOPY    . 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 TOTAL KNEE ARTHROPLASTY Right 01/28/2016   Procedure: RIGHT TOTAL KNEE ARTHROPLASTY;  Surgeon: [name], MD;  Location: [facility];  Service: Orthopedics;  Laterality: Right;  . TRIGGER FINGER RELEASE Right 06/18/2014   Procedure: RIGHT LONG FINGER TRIGGER RELEASE;  Surgeon: [name], MD;  Location: [facility];  Service: Orthopedics;  Laterality: Right;  . TUBAL LIGATION    . VAGINAL HYSTERECTOMY Bilateral 11/04/2016   Procedure: HYSTERECTOMY VAGINAL with Bilateral Salpingectomy;  Surgeon: [name], MD;  Location: [facility];  Service: Gynecology;  Laterality: Bilateral;    There were no vitals filed for this visit.  Subjective Assessment - 12/26/17 1331    Currently in Pain?  Other (Comment) OA pain       S: "I like to communicate to problem solve, but feel devalued in my communication at work"  O: Education given on communication skills to apply when reintegrating into community dwelling. Connection made of how mental health  conditions tie into communication difficulties and how to improve these practices. Nonverbal and conversational skills discussed and applied to pt daily life. Further education given on how to practice these skills within the community to build confidence. Education also given on how to express feelings/emotions to others with appropriate social skills. Active listening discussed for further sessions, handout given for pt to reference. Pt asked to identify one area of communication they wish to develop this date.  ?  A: Pt presents to group with flat affect, engaged and participatory throughout session. Pt educated on nonverbal and conversational skills to apply when reintegrating into community this date. Pt shares that she has most difficulty expressing herself at work without feeling devalued. She states her work place is very unsupportive with closed communication. Pt states she would like to work on her facial expressions during conversation, because they can often come off as offensive. Pt in understanding of active listening and acquired handout.   P: Pt provided with communication skills to implement when reintegrating into community dwelling. OT will continue follow up with communication skills for successful implementation in daily life.                      OT Education - 12/26/17 1331    Education Details  education given on communication skills to apply when reinegrating  into community    Person(s) Educated  Patient    Methods  Explanation;Handout    Comprehension  Verbalized understanding       OT Short Term Goals - 12/26/17 1333      OT SHORT TERM GOAL #1   Title  Pt will apply psychosocial skills and coping mechanisms to daily activities in order to function independently and reintegrate into community dwelling    Time  4    Period  Weeks    Status  On-going    Target Date  01/22/18      OT SHORT TERM GOAL #2   Title  Pt will be educated on strategies to  improve psychosocial skills needed to participate fully in all daily, work, and leisure activities    Time  4    Period  Weeks    Status  On-going      OT [place] #3   Title  Pt will be educated and independent with HEP implementation for improved coping strategies    Time  4    Period  Weeks    Status  On-going               Plan - 12/26/17 1332    Occupational performance deficits (Please refer to evaluation for details):  ADL's;IADL's;Rest and Sleep;Work;Leisure;Social Participation       Patient will benefit from skilled therapeutic intervention in order to improve the following deficits and impairments:  Decreased coping skills, Decreased psychosocial skills, Other (comment)(decreased ability to engage in BADL and reintegrate into community)  Visit Diagnosis: 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 Chronic right shoulder pain 12/20/2017  . 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 MDD (major depressive disorder), recurrent episode, moderate ([place]) 11/04/2017  . MGUS (monoclonal gammopathy of unknown significance) 10/24/2017  . Primary osteoarthritis of both hands 09/26/2017  . Primary osteoarthritis of right shoulder 09/26/2017  . Primary osteoarthritis of both hips 09/26/2017  . Status post total knee replacement, right 09/26/2017  . Primary osteoarthritis of both feet 09/26/2017  . History of asthma 09/26/2017  . Primary osteoarthritis of left knee 04/18/2017  . Menorrhagia 11/04/2016  . Fibroid tumor 11/04/2016  . Adenomyosis 11/04/2016  . Hypertension, essential 11/04/2016  . Total knee replacement status 01/28/2016   Zenovia Jarred, MSOT, OTR/L  [place] 12/26/2017, 1:34 PM  [facility] PARTIAL HOSPITALIZATION PROGRAM [place], [place], [address] Phone: [phone]   Fax:  [phone]  Name: GOLDY CALANDRA MRN: [id number] Date of Birth: 1960-08-03

## 2017-12-27 ENCOUNTER — Encounter (HOSPITAL_COMMUNITY): Payer: Self-pay | Admitting: Family

## 2017-12-27 ENCOUNTER — Other Ambulatory Visit (HOSPITAL_COMMUNITY): Payer: PRIVATE HEALTH INSURANCE | Admitting: Licensed Clinical Social Worker

## 2017-12-27 DIAGNOSIS — F331 Major depressive disorder, recurrent, moderate: Secondary | ICD-10-CM

## 2017-12-28 ENCOUNTER — Other Ambulatory Visit (HOSPITAL_COMMUNITY): Payer: PRIVATE HEALTH INSURANCE | Attending: Psychiatry | Admitting: Occupational Therapy

## 2017-12-28 ENCOUNTER — Encounter (HOSPITAL_COMMUNITY): Payer: Self-pay | Admitting: Occupational Therapy

## 2017-12-28 ENCOUNTER — Other Ambulatory Visit (HOSPITAL_COMMUNITY): Payer: PRIVATE HEALTH INSURANCE | Admitting: Licensed Clinical Social Worker

## 2017-12-28 ENCOUNTER — Ambulatory Visit (HOSPITAL_COMMUNITY): Payer: 59 | Admitting: Psychiatry

## 2017-12-28 DIAGNOSIS — F331 Major depressive disorder, recurrent, moderate: Secondary | ICD-10-CM | POA: Insufficient documentation

## 2017-12-28 DIAGNOSIS — F079 Unspecified personality and behavioral disorder due to known physiological condition: Secondary | ICD-10-CM

## 2017-12-28 DIAGNOSIS — R4589 Other symptoms and signs involving emotional state: Secondary | ICD-10-CM

## 2017-12-28 DIAGNOSIS — Z9114 Patient's other noncompliance with medication regimen: Secondary | ICD-10-CM | POA: Diagnosis not present

## 2017-12-28 NOTE — Therapy (Signed)
[facility] [place], [place], [address] Phone: [phone]   Fax:  [phone]  Occupational Therapy Treatment  Patient Details  Name: Hannah [name] MRN: [id number] Date of Birth: [birth year] Referring Provider: Ricky Ala, NP   Encounter Date: 12/28/2017  OT End of Session - 12/28/17 1355    Visit Number  3    Number of Visits  16    Date for OT Re-Evaluation  01/22/18    Authorization Type  [employer]    OT Start Time  1100    OT Stop Time  1200    OT Time Calculation (min)  60 min    Activity Tolerance  Patient tolerated treatment well    Behavior During Therapy  [facility] for tasks assessed/performed       Past Medical History:  Diagnosis Date  . Allergy   . Anemia   . Anxiety   . Arthritis   . Asthma   . Depression   . Hypertension     Past Surgical History:  Procedure Laterality Date  . CHOLECYSTECTOMY  1993  . COLONOSCOPY    . 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 TOTAL KNEE ARTHROPLASTY Right 01/28/2016   Procedure: RIGHT TOTAL KNEE ARTHROPLASTY;  Surgeon: [name], MD;  Location: [facility];  Service: Orthopedics;  Laterality: Right;  . TRIGGER FINGER RELEASE Right 06/18/2014   Procedure: RIGHT LONG FINGER TRIGGER RELEASE;  Surgeon: [name], MD;  Location: [facility];  Service: Orthopedics;  Laterality: Right;  . TUBAL LIGATION    . VAGINAL HYSTERECTOMY Bilateral 11/04/2016   Procedure: HYSTERECTOMY VAGINAL with Bilateral Salpingectomy;  Surgeon: [name], MD;  Location: [facility];  Service: Gynecology;  Laterality: Bilateral;    There were no vitals filed for this visit.  Subjective Assessment - 12/28/17 1353    Currently in Pain?  Other (Comment) OA pain        S: "I am going to start playing positive affirmations everyday"   O: Education and demonstration given on using yoga as a psychosocial coping strategy throughout daily routines. Yoga video used as  therapeutic aide for participants to follow and incorporate into daily routine. Possible activity restrictions addressed for safety of pt in yoga practice. Opportunities for practice of yoga on the floor with a mat or in a chair per preference. Further education given on how to access various styles and types of yoga for different situations (exercise, stretch, relaxation, etc.), pt in understanding. Information given on the importance of positive self-talk, with the use of positive affirmations as a coping strategy. Meditation video with incorporated positive affirmations used as therapeutic aide to help continue to build coping skills. Handouts given to educate on incorporating exercise into daily routine, contraindications, and places in the community to get active.   A: Pt presents to group with flat affect, was actively engaged in treatment this date. Pt will minimal yoga experience this date. Pt participated in yoga while sitting in chair. Mild OA pain was noted at baseline so activity was was limited to not increase baseline pain. Pt in understanding of how to acquire different yoga practices for appropriate situation and routine. Pt exhibits understanding of the importance of positive self-talk and how positive affirmations can be a useful coping strategy to combat negative thoughts. Pt expressed much enjoyment of the positive affirmation videos and wishes to consistently use this practice and share it with others in her family.  P:  Pt provided with education and demonstration of yoga and using positive affirmations during meditation as an effective coping strategy. OT will continue to follow up on skills learned this date for successful implementation into daily life.                      OT Education - 12/28/17 1354    Education Details  education and demonstration of yoga and positive affirmations as a coping strategy    Person(s) Educated  Patient    Methods   Explanation;Handout    Comprehension  Verbalized understanding       OT Short Term Goals - 12/26/17 1333      OT SHORT TERM GOAL #1   Title  Pt will apply psychosocial skills and coping mechanisms to daily activities in order to function independently and reintegrate into community dwelling    Time  4    Period  Weeks    Status  On-going    Target Date  01/22/18      OT SHORT TERM GOAL #2   Title  Pt will be educated on strategies to improve psychosocial skills needed to participate fully in all daily, work, and leisure activities    Time  4    Period  Weeks    Status  On-going      OT [place] #3   Title  Pt will be educated and independent with HEP implementation for improved coping strategies    Time  4    Period  Weeks    Status  On-going               Plan - 12/28/17 1355    Occupational performance deficits (Please refer to evaluation for details):  ADL's;IADL's;Rest and Sleep;Work;Leisure;Social Participation       Patient will benefit from skilled therapeutic intervention in order to improve the following deficits and impairments:  Decreased coping skills, Decreased psychosocial skills, Other (comment)(decreased ability to engage in BADL and reintegrate into community)  Visit Diagnosis: 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 Chronic right shoulder pain 12/20/2017  . 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 MDD (major depressive disorder), recurrent episode, moderate ([place]) 11/04/2017  . MGUS (monoclonal gammopathy of unknown significance) 10/24/2017  . Primary osteoarthritis of both hands 09/26/2017  . Primary osteoarthritis of right shoulder 09/26/2017  . Primary osteoarthritis of both hips 09/26/2017  . Status post total knee replacement, right 09/26/2017  . Primary osteoarthritis of both feet 09/26/2017  .  History of asthma 09/26/2017  . Primary osteoarthritis of left knee 04/18/2017  . Menorrhagia 11/04/2016  . Fibroid tumor 11/04/2016  . Adenomyosis 11/04/2016  . Hypertension, essential 11/04/2016  . Total knee replacement status 01/28/2016   Zenovia Jarred, MSOT, OTR/L  [place] 12/28/2017, 1:56 PM  [facility] PARTIAL HOSPITALIZATION PROGRAM [place], [place], [address] Phone: [phone]   Fax:  [phone]  Name: Hannah [name] MRN: [id number] Date of Birth: 1961/05/13

## 2017-12-28 NOTE — Psych (Signed)
   [facility] PHP THERAPIST PROGRESS NOTE  [name] [id number]  Session Time: 9:00 - 11:00  Participation Level: Active  Behavioral Response: CasualAlertDepressed and Irritable  Type of Therapy: Group Therapy; Psychotherapy  Treatment Goals addressed: Coping  Interventions: CBT, DBT, Solution Focused, Supportive and Reframing  Summary: Clinician led check-in regarding current stressors and situation, and review of patient completed daily inventory. Clinician utilized active listening and empathetic response and validated patient emotions. Clinician facilitated processing group on pertinent issues.   Therapist Response: Hannah [name] is a 57 y.o. female who presents with depression symptoms. Patient arrived within time allowed and reports that she is feeling "okay." Patient rates her mood at a 5 on a scale of 1-10 with 10 being great. Pt reports she began taking her medication again last night. Pt reports church went "okay" and she tried to pace herself to not get frustrated. Pt reports continued poor sleep. Pt is able to process this in group. Patient engaged in discussion.      Session Time: 11:00 -12:00   Participation Level: Active   Behavioral Response: CasualAlertDepressed   Type of Therapy: Group Therapy, OT   Treatment Goals addressed: Coping   Interventions: Psychosocial skills training, Supportive,    Summary:  Occupational Therapy group   Therapist Response: Patient engaged in group. See OT note.           Session Time: 12:00 - 12:45  Participation Level: Active  Behavioral Response: CasualAlertDepressed  Type of Therapy: Group Therapy, Activity Therapy  Treatment Goals addressed: Coping  Interventions: Systems analyst, Supportive  Summary:  Reflection Group: Patients encouraged to practice skills and interpersonal techniques or work on mindfulness and relaxation techniques. The importance of self-care and making skills part of a routine  to increase usage were stressed   Therapist Response: Patient engaged and participated appropriately.       Session Time: 12:45- 2:00  Participation Level: Active  Behavioral Response: CasualAlertDepressed  Type of Therapy: Group Therapy, Psychoeducation; Psychotherapy  Treatment Goals addressed: Coping  Interventions: CBT; Solution focused; Supportive; Reframing  Summary: 12:45 - 1:50: Clinician continued topic of distress tolerance and educated group on ACCEPTS skill. Group members discussed examples and applications for their lives. 1:50 -2:00 Clinician led check-out. Clinician assessed for immediate needs, medication compliance and efficacy, and safety concerns   Therapist Response: Patient engaged activity and discussion. Pt able to recognize strategies for application including music. At [place], patient rates her mood at a 7 on a scale of 1-10 with 10 being great. Patient reports plans of going to church this afternoon.  Patient demonstrates some progress as evidenced by trying to use skills last night. Patient denies SI/HI/self-harm at the end of group.     Suicidal/Homicidal: Nowithout intent/plan   Plan: Pt will continue in PHP while working to decrease depression and increase skillfulness to manage symptoms.    Diagnosis: MDD (major depressive disorder), recurrent episode, moderate ([place]) [F33.1]    1. MDD (major depressive disorder), recurrent episode, moderate ([place])       Lorin Glass, LCSW 12/28/2017

## 2017-12-28 NOTE — Psych (Signed)
   [facility] BH PHP THERAPIST PROGRESS NOTE  Hannah [name] [id number]  Session Time: 9:00 - 11:00  Participation Level: Active  Behavioral Response: CasualAlertDepressed and Irritable  Type of Therapy: Group Therapy; Psychotherapy  Treatment Goals addressed: Coping  Interventions: CBT, DBT, Solution Focused, Supportive and Reframing  Summary: Clinician led check-in regarding current stressors and situation, and review of patient completed daily inventory. Clinician utilized active listening and empathetic response and validated patient emotions. Clinician facilitated processing group on pertinent issues.   Therapist Response: Hannah [name] is a 56 y.o. female who presents with depression symptoms. Patient arrived within time allowed and reports that she is feeling "agitated." Patient rates her mood at a 1.5 on a scale of 1-10 with 10 being great. Pt reports she spent the weekend with family which was nice, but sad because her uncle is dying. Pt states stress regarding helping with a church conference this week. Pt shares she is not taking her medication and has some fears regarding side effects. Pt is able to process this in group. Patient engaged in discussion.      Session Time: 11:00 -12:00   Participation Level: Active   Behavioral Response: CasualAlertDepressed   Type of Therapy: Group Therapy, OT   Treatment Goals addressed: Coping   Interventions: Psychosocial skills training, Supportive,    Summary:  Occupational Therapy group   Therapist Response: Patient engaged in group. See OT note.           Session Time: 12:00 - 12:45  Participation Level: Active  Behavioral Response: CasualAlertDepressed  Type of Therapy: Group Therapy, Activity Therapy  Treatment Goals addressed: Coping  Interventions: Systems analyst, Supportive  Summary:  Reflection Group: Patients encouraged to practice skills and interpersonal techniques or work on mindfulness and  relaxation techniques. The importance of self-care and making skills part of a routine to increase usage were stressed   Therapist Response: Patient engaged and participated appropriately.       Session Time: 12:45- 2:00  Participation Level: Active  Behavioral Response: CasualAlertAnxious and Depressed  Type of Therapy: Group Therapy, Psychoeducation; Psychotherapy  Treatment Goals addressed: Coping  Interventions: CBT; Solution focused; Supportive; Reframing  Summary: 12:45 - 1:50: Clinician introduced topic of distress tolerance. Clinician educated group on what the skills are and their purpose. Cln introduced STOP skill and group members discussed how to incorporate this into their lives. 1:50 -2:00 Clinician led check-out. Clinician assessed for immediate needs, medication compliance and efficacy, and safety concerns   Therapist Response: Patient engaged activity and discussion. Pt reports this will be helpful when she is ruminating.   At [place], patient rates her mood at a 5 on a scale of 1-10 with 10 being great. Patient reports plans of doing her hair and going to church this afternoon. Patient demonstrates some progress as evidenced by engaging in first group session. Patient denies SI/HI/self-harm at the end of group.     Suicidal/Homicidal: Nowithout intent/plan   Plan: Pt will continue in PHP while working to decrease depression and increase skillfulness to manage symptoms.    Diagnosis: MDD (major depressive disorder), recurrent episode, moderate ([place]) [F33.1]    1. MDD (major depressive disorder), recurrent episode, moderate ([place])       Lorin Glass, LCSW 12/28/2017

## 2017-12-29 ENCOUNTER — Other Ambulatory Visit (HOSPITAL_COMMUNITY): Payer: Self-pay

## 2017-12-29 ENCOUNTER — Telehealth (HOSPITAL_COMMUNITY): Payer: Self-pay | Admitting: Professional

## 2017-12-29 ENCOUNTER — Ambulatory Visit (HOSPITAL_COMMUNITY): Payer: Self-pay | Admitting: Occupational Therapy

## 2018-01-01 ENCOUNTER — Other Ambulatory Visit (HOSPITAL_COMMUNITY): Payer: PRIVATE HEALTH INSURANCE | Admitting: Occupational Therapy

## 2018-01-01 ENCOUNTER — Encounter (HOSPITAL_COMMUNITY): Payer: Self-pay | Admitting: Occupational Therapy

## 2018-01-01 ENCOUNTER — Other Ambulatory Visit (HOSPITAL_COMMUNITY): Payer: PRIVATE HEALTH INSURANCE | Admitting: Licensed Clinical Social Worker

## 2018-01-01 DIAGNOSIS — F331 Major depressive disorder, recurrent, moderate: Secondary | ICD-10-CM | POA: Diagnosis not present

## 2018-01-01 DIAGNOSIS — R4589 Other symptoms and signs involving emotional state: Secondary | ICD-10-CM

## 2018-01-01 DIAGNOSIS — F079 Unspecified personality and behavioral disorder due to known physiological condition: Secondary | ICD-10-CM

## 2018-01-01 NOTE — Psych (Signed)
   [facility] BH PHP THERAPIST PROGRESS NOTE  Hannah [name] [id number]  Session Time: 9:00 - 9:50  Participation Level: Minimal  Behavioral Response: CasualAlertDepressed and Irritable  Type of Therapy: Group Therapy; Psychotherapy  Treatment Goals addressed: Coping  Interventions: CBT, DBT, Solution Focused, Supportive and Reframing  Summary: Clinician led check-in regarding current stressors and situation, and review of patient completed daily inventory. Clinician utilized active listening and empathetic response and validated patient emotions. Clinician facilitated processing group on pertinent issues.   Therapist Response: RALENE GASPARYAN is a 57 y.o. female who presents with depression symptoms. Patient arrived within time allowed and reports that she is feeling "out of it." Patient rates her mood at a 3 on a scale of 1-10 with 10 being great. Pt reports feeling irritable and dealing with pain. Pt is short with answers and has depressed affect. Pt requests to leave at 9:50, stating she needs to rest and cannot focus in group today. Cln meets with pt to assess for risk prior to pt leaving. Pt denies SI/HI.        Suicidal/Homicidal: Nowithout intent/plan   Plan: Pt will continue in PHP while working to decrease depression and increase skillfulness to manage symptoms.    Diagnosis: MDD (major depressive disorder), recurrent episode, moderate ([place]) [F33.1]    1. MDD (major depressive disorder), recurrent episode, moderate ([place])       Lorin Glass, LCSW 01/01/2018

## 2018-01-01 NOTE — Therapy (Signed)
[place], [place], [address] Phone: [phone]   Fax:  [phone]  Occupational Therapy Treatment  Patient Details  Name: Hannah [name] MRN: [id number] Date of Birth: Jul 08, 1960 Referring Provider: Ricky Ala, NP   Encounter Date: 01/01/2018  OT End of Session - 01/01/18 1545    Visit Number  4    Number of Visits  16    Date for OT Re-Evaluation  01/22/18    Authorization Type  [employer]    OT Start Time  1100    OT Stop Time  1200    OT Time Calculation (min)  60 min    Activity Tolerance  Patient tolerated treatment well    Behavior During Therapy  [facility] for tasks assessed/performed       Past Medical History:  Diagnosis Date  . Allergy   . Anemia   . Anxiety   . Arthritis   . Asthma   . Depression   . Hypertension     Past Surgical History:  Procedure Laterality Date  . CHOLECYSTECTOMY  1993  . COLONOSCOPY    . 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 TOTAL KNEE ARTHROPLASTY Right 01/28/2016   Procedure: RIGHT TOTAL KNEE ARTHROPLASTY;  Surgeon: [name], MD;  Location: [place];  Service: Orthopedics;  Laterality: Right;  . TRIGGER FINGER RELEASE Right 06/18/2014   Procedure: RIGHT LONG FINGER TRIGGER RELEASE;  Surgeon: [name], MD;  Location: [place];  Service: Orthopedics;  Laterality: Right;  . TUBAL LIGATION    . VAGINAL HYSTERECTOMY Bilateral 11/04/2016   Procedure: HYSTERECTOMY VAGINAL with Bilateral Salpingectomy;  Surgeon: [name], MD;  Location: [facility];  Service: Gynecology;  Laterality: Bilateral;    There were no vitals filed for this visit.  Subjective Assessment - 01/01/18 1544    Currently in Pain?  Other (Comment) OA pain       S: "I get stressed out because I help too many people"  O: Stress management group completed to use as productive coping strategy, to help mitigate maladaptive coping to integrate in functional  BADL/IADL. Education given on the definition of stress and its cognitive, behavioral, emotional, and physical effects on the body. Stress symptom checklist completed to raise insight on current symptoms felt with stress. Stress management tool worksheet begun, to finish next treatment date. Mood tracker worksheet given to implement into daily routine.  A: Pt presents to group with appropriate, engaged and participatory throughout session. Pt has tendency to be tangential about unnecessary topics. Pt completed stress symptom checklist, reporting pt has high stress levels. Pt in agrees with score and states she loses appetite and becomes irritable when stressed. Stress management tools worksheet initiated, with pt identifying that she suppresses emotions until the point of acting out. She also mentions stress because she takes care of too many people and not herself.  P: Pt provided with education on stress management activities. OT will continue to follow up with activities learned for successful implementation into daily life                      OT Education - 01/01/18 1545    Education Details  education given on stress management to apply when reintegrating into community    Person(s) Educated  Patient    Methods  Explanation;Handout    Comprehension  Verbalized understanding       OT Short Term Goals -  12/26/17 1333      OT SHORT TERM GOAL #1   Title  Pt will apply psychosocial skills and coping mechanisms to daily activities in order to function independently and reintegrate into community dwelling    Time  4    Period  Weeks    Status  On-going    [place] Date  01/22/18      OT SHORT TERM GOAL #2   Title  Pt will be educated on strategies to improve psychosocial skills needed to participate fully in all daily, work, and leisure activities    Time  4    Period  Weeks    Status  On-going      OT [place] #3   Title  Pt will be educated and independent with  HEP implementation for improved coping strategies    Time  4    Period  Weeks    Status  On-going               Plan - 01/01/18 1545    Occupational performance deficits (Please refer to evaluation for details):  ADL's;IADL's;Rest and Sleep;Work;Leisure;Social Participation       Patient will benefit from skilled therapeutic intervention in order to improve the following deficits and impairments:  Decreased coping skills, Decreased psychosocial skills, Other (comment)(decreased ability to engage in BADL and reintegrate into community)  Visit Diagnosis: 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 Chronic right shoulder pain 12/20/2017  . 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 MDD (major depressive disorder), recurrent episode, moderate ([place]) 11/04/2017  . MGUS (monoclonal gammopathy of unknown significance) 10/24/2017  . Primary osteoarthritis of both hands 09/26/2017  . Primary osteoarthritis of right shoulder 09/26/2017  . Primary osteoarthritis of both hips 09/26/2017  . Status post total knee replacement, right 09/26/2017  . Primary osteoarthritis of both feet 09/26/2017  . History of asthma 09/26/2017  . Primary osteoarthritis of left knee 04/18/2017  . Menorrhagia 11/04/2016  . Fibroid tumor 11/04/2016  . Adenomyosis 11/04/2016  . Hypertension, essential 11/04/2016  . Total knee replacement status 01/28/2016   Zenovia Jarred, MSOT, OTR/L  [place] 01/01/2018, 3:46 PM  [facility] PARTIAL HOSPITALIZATION PROGRAM [place], [place], [address] Phone: [phone]   Fax:  [phone]  Name: Hannah [name] MRN: [id number] Date of Birth: 09-16-1960

## 2018-01-01 NOTE — Progress Notes (Signed)
[place] MD/PA/NP OP Progress Note  01/15/2018 8:42 AM Hannah [name]  MRN:  [id number]  Chief Complaint:  Chief Complaint    Depression; Follow-up     HPI:  Patient presents late for follow up appointment for depression.  She states that she "don't know" when she is asked how she was doing. She completed PHP a few days ago, which was originally recommended by her therapist. She feels "up and down." She felt very overwhelmed and had significant anxiety when she tried to return to work.  She has been very frustrated and overwhelmed as her leave was not approved.  She does not know what to do.  Although she wants to go back to work, she finds it very difficult due to her difficulty in concentration.  She tends to stay in the bed most of the time.  When she was able to take a walk in the morning, she feels slightly better.  She could not continue sertraline 100 mg due to nausea. She self discontinued sertraline as she was taking it in the morning (as opposed to night) and felt drowsy when she went to work.  It was recently restarted at [facility] at lower dose. She denies drowsiness when she takes it at night. She has insomnia.  She feels fatigue.  She has anhedonia.  She has passive SI.  She feels mildly anxious.  She denies panic attacks.   Visit Diagnosis:    ICD-10-CM   1. Moderate episode of recurrent major depressive disorder ([place]) F33.1     Past Psychiatric History: Please see initial evaluation for full details. I have reviewed the history. No updates at this time.     Past Medical History:  Past Medical History:  Diagnosis Date  . Allergy   . Anemia   . Anxiety   . Arthritis   . Asthma   . Depression   . Hypertension     Past Surgical History:  Procedure Laterality Date  . CHOLECYSTECTOMY  1993  . COLONOSCOPY    . 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 TOTAL KNEE ARTHROPLASTY Right 01/28/2016   Procedure: RIGHT TOTAL KNEE ARTHROPLASTY;  Surgeon: [name], MD;  Location: [facility];   Service: Orthopedics;  Laterality: Right;  . TRIGGER FINGER RELEASE Right 06/18/2014   Procedure: RIGHT LONG FINGER TRIGGER RELEASE;  Surgeon: [name], MD;  Location: [facility];  Service: Orthopedics;  Laterality: Right;  . TUBAL LIGATION    . VAGINAL HYSTERECTOMY Bilateral 11/04/2016   Procedure: HYSTERECTOMY VAGINAL with Bilateral Salpingectomy;  Surgeon: [name], MD;  Location: [facility];  Service: Gynecology;  Laterality: Bilateral;    Family Psychiatric History: Please see initial evaluation for full details. I have reviewed the history. No updates at this time.     Family History:  Family History  Problem Relation Age of Onset  . Cancer Mother        uterine   . Asthma Mother   . Arthritis Daughter   . Depression Maternal Uncle   . Colon cancer Neg Hx     Social History:  Social History   Socioeconomic History  . Marital status: Married    Spouse name: Not on file  . Number of children: Not on file  . Years of education: Not on file  . Highest education level: Not on file  Occupational History  . Not on file  Social Needs  . Financial resource strain: Not hard at all  . Food  insecurity:    Worry: Never true    Inability: Never true  . Transportation needs:    Medical: No    Non-medical: No  Tobacco Use  . Smoking status: Never Smoker  . Smokeless tobacco: Never Used  Substance and Sexual Activity  . Alcohol use: No    Alcohol/week: 0.0 standard drinks  . Drug use: No  . Sexual activity: Yes    Partners: Male    Birth control/protection: Surgical  Lifestyle  . Physical activity:    Days per week: 0 days    Minutes per session: 0 min  . Stress: Very much  Relationships  . Social connections:    Talks on phone: More than three times a week    Gets together: Once a week    Attends religious service: More than 4 times per year    Active member of club or organization: Yes    Attends meetings of clubs or organizations:  More than 4 times per year    Relationship status: Married  Other Topics Concern  . Not on file  Social History Narrative  . Not on file    Allergies:  Allergies  Allergen Reactions  . Sulfur Swelling    Facial swelling     Metabolic Disorder Labs: No results found for: HGBA1C, MPG No results found for: PROLACTIN No results found for: CHOL, TRIG, HDL, CHOLHDL, VLDL, LDLCALC Lab Results  Component Value Date   TSH 1.14 09/05/2017    Therapeutic Level Labs: No results found for: LITHIUM No results found for: VALPROATE No components found for:  CBMZ  Current Medications: Current Outpatient Medications  Medication Sig Dispense Refill  . budesonide-formoterol (SYMBICORT) 160-4.5 MCG/ACT inhaler Inhale 2 puffs into the lungs daily as needed.    . chlorthalidone (HYGROTON) 25 MG tablet TAKE 1 TABLET BY MOUTH EVERY DAY FOR HTN  3  . ferrous sulfate 325 (65 FE) MG tablet Take 325 mg by mouth 2 (two) times daily with a meal.    . hydrochlorothiazide (HYDRODIURIL) 12.5 MG tablet Take 12.5 mg by mouth daily.    Marland Kitchen ibuprofen (ADVIL,MOTRIN) 600 MG tablet 600 MG BY MOUTH EVERY 6 HOURS FOR 5 DAYS, THEN EVERY 6 HOURS AS NEEDED FOR PAIN.  DON'T TAKE MOBIC WHILE TAKING IBUPROFEN 60 tablet 0  . Multiple Vitamin (MULTIVITAMIN) tablet Take 1 tablet by mouth daily.    . naproxen sodium (ALEVE) 220 MG tablet Take 220 mg by mouth as needed.    . Potassium (POTASSIMIN PO) Take by mouth.    . sertraline (ZOLOFT) 50 MG tablet Take 1 tablet (50 mg total) by mouth at bedtime. 30 tablet 0  . traZODone (DESYREL) 50 MG tablet Take 1 tablet (50 mg total) by mouth at bedtime. 30 tablet 0  . buPROPion (WELLBUTRIN XL) 150 MG 24 hr tablet Take 1 tablet (150 mg total) by mouth daily. 30 tablet 0  . docusate sodium (COLACE) 100 MG capsule Take 1 capsule (100 mg total) by mouth 2 (two) times daily. (Patient not taking: Reported on 12/25/2017) 10 capsule 0  . ondansetron (ZOFRAN) 4 MG tablet Take 1 tablet (4 mg  total) by mouth every 8 (eight) hours as needed for nausea or vomiting. (Patient not taking: Reported on 12/25/2017) 20 tablet 0  . potassium chloride SA (K-DUR,KLOR-CON) 20 MEQ tablet Take 1 tablet (20 mEq total) by mouth daily. Take 1 tab twice daily for 3 days then take 1 tab daily (Patient not taking: Reported on 12/25/2017) 33 tablet  0  . Vitamin D, Ergocalciferol, (DRISDOL) 50000 units CAPS capsule Take 50,000 Units by mouth every Monday.      No current facility-administered medications for this visit.      Musculoskeletal: Strength & Muscle Tone: within normal limits Gait & Station: normal Patient leans: N/A  Psychiatric Specialty Exam: Review of Systems  Psychiatric/Behavioral: Positive for depression and suicidal ideas. Negative for hallucinations, memory loss and substance abuse. The patient is nervous/anxious and has insomnia.   All other systems reviewed and are negative.   Blood pressure 119/81, pulse 70, height 5\' 6"  (1.676 m), weight 238 lb (108 kg), last menstrual period 10/22/2016, SpO2 94 %.Body mass index is 38.41 kg/m.  General Appearance: Fairly Groomed  Eye Contact:  Minimal  Speech:  Clear and Coherent  Volume:  Normal  Mood:  Depressed  Affect:  Appropriate, Congruent, Tearful and dysphoric  Thought Process:  Coherent  Orientation:  Full (Time, Place, and Person)  Thought Content: Logical   Suicidal Thoughts:  Yes.  without intent/plan  Homicidal Thoughts:  No  Memory:  Immediate;   Good  Judgement:  Good  Insight:  Present  Psychomotor Activity:  Decreased  Concentration:  Concentration: Poor and Attention Span: Poor  Recall:  Good  Fund of Knowledge: Good  Language: Good  Akathisia:  No  Handed:  Right  AIMS (if indicated): not done  Assets:  Desire for Improvement  ADL's:  Intact  Cognition: WNL  Sleep:  Poor   Screenings: GAD-7     Counselor from 12/25/2017 in [facility]  Total GAD-7 Score  17     PHQ2-9     Counselor from 12/25/2017 in [facility]  PHQ-2 Total Score  6  PHQ-9 Total Score  24       Assessment and Plan:  Hannah [name] is a 57 y.o. year old female with a history of depression, abnormal SPEP,OA,chronic anemia, hypertension , who presents for follow up appointment for Moderate episode of recurrent major depressive disorder ([place])  # MDD, moderate, recurrent without psychotic features Exam is notable for worsening affect (tearful, dysphoric), slightly slowed psychomotor activity and patient endorses worsening in neurovegetative symptoms since the last appointment.  The patient could not tolerate higher dose of sertraline due to nausea.  Will add Wellbutrin as adjunctive treatment for depression. Discussed risk of worsening anxiety, headache. She has no known history of seizure.  Will continue sertraline at lower dose to target depression. Will continue trazodone for insomnia. Psychosocial stressors including loss of her mother, work environment and she is demoralized by her current medical condition.  Discussed behavioral activation.  She is encouraged to continue to see her therapist.   Patient will not be able to function at work given severity of her symptoms, which includes difficulty in concentration (evidenced by her difficulty in answering questions). Patient will not be able to complete tasks nor collaborate with others. It is discussed that patient remain out of work until the next appointment.   Plan 1. Continue sertraline 50 mg daily  2. Start Wellbutrin 150 mg daily  3. Continue Trazodone 50 mg at night as needed for sleep 4. Return to clinic in one month for 30 mins - Will send today's note per patient request, write return to work note (out of work until next appt)  The patient demonstrates the following risk factors for suicide: Chronic risk factors for suicide include:psychiatric disorder ofdepressionand medical  illness of OA. Acute risk factorsfor  suicide include: N/A. Protective factorsfor this patient include: positive social support, responsibility to others (children, family), coping skills and hope for the future. Considering these factors, the overall suicide risk at this point appears to below. Patientisappropriate for outpatient follow up.  The duration of this appointment visit was 30 minutes of face-to-face time with the patient.  Greater than 50% of this time was spent in counseling, explanation of  diagnosis, planning of further management, and coordination of care.   Norman Clay, MD 01/15/2018, 8:42 AM

## 2018-01-02 ENCOUNTER — Other Ambulatory Visit (HOSPITAL_COMMUNITY): Payer: PRIVATE HEALTH INSURANCE | Admitting: Licensed Clinical Social Worker

## 2018-01-02 ENCOUNTER — Other Ambulatory Visit (INDEPENDENT_AMBULATORY_CARE_PROVIDER_SITE_OTHER): Payer: PRIVATE HEALTH INSURANCE | Admitting: Occupational Therapy

## 2018-01-02 ENCOUNTER — Encounter (HOSPITAL_COMMUNITY): Payer: Self-pay | Admitting: Family

## 2018-01-02 DIAGNOSIS — F079 Unspecified personality and behavioral disorder due to known physiological condition: Secondary | ICD-10-CM

## 2018-01-02 DIAGNOSIS — F331 Major depressive disorder, recurrent, moderate: Secondary | ICD-10-CM

## 2018-01-02 DIAGNOSIS — R4589 Other symptoms and signs involving emotional state: Secondary | ICD-10-CM

## 2018-01-02 NOTE — Therapy (Addendum)
[place], [place], [address] Phone: [phone]   Fax:  [phone]  Patient Details  Name: Hannah [name] MRN: [id number] Date of Birth: 22-May-1961 Referring Provider:  [name], MD  Encounter Date: 01/02/2018  Pt left group prior to OT session, pt being d/c due to meeting goals and missing +3 sessions in PHP.   OCCUPATIONAL THERAPY DISCHARGE SUMMARY  Visits from Start of Care: 4  Current functional level related to goals / functional outcomes: Independent    Remaining deficits: N/A, pt is stepping down to individual counseling level of care   Education / Equipment: Education given on a variety of psychosocial coping mechanisms to apply to BADL/IADL routine to increase community reintegration. Plan: Patient agrees to discharge.  Patient goals were met. Patient is being discharged due to meeting the stated rehab goals.  ?????        Zenovia Jarred, MSOT, OTR/L  [place] 01/02/2018, 1:22 PM  [facility] HOSPITALIZATION PROGRAM [place], [place], [address] Phone: [phone]   Fax:  [phone]

## 2018-01-02 NOTE — Progress Notes (Signed)
  [facility] Discharge Summary  Hannah [name] [id number]  Admission date:  Discharge date: 01/02/2018  Reason for admission:  Worsening Depression   Per assessment note: Hannah [name] is a 57 y.o. African American female presents with worsening depression and mood irritability.  Patient was recently discharged from intensive outpatient hospitalization and was to follow-up with primary psychiatrist.  Patient was prescribed sertraline 150 mg at the time of discharge.  However reports she has not been taking her medications as prescribed.  Reports a fear of medication side effects and reports she felt that she was doing better without the medications.  Reports after going back to work she noticed her mood and a consultation have been getting increasingly worse.  Reports racing thoughts, and ongoing grief related to death and dying.  Hannah [name] reports her own physical health has been declining and she has fears that she could possibly be diagnosed with cancer.  Denies suicidal or homicidal ideation.  Denies auditory or  visual hallucinations.  Patient reports a poor appetite continues to report job related stressors.  Reports during her previous admission to the intensive outpatient program patient had a "peer to to peer' review how however her disability was denied.  patient was enrolled in partial psychiatric program on 12/26/17.   Chemical Use History: was denied   Family of Origin Issues:  Reports she continues to be a support symptom  for her family states she recently discovered that her uncle health is declining and needs to be there for her family.    Progress in Program Toward Treatment Goals: Hannah [name] was recently discharged from our intensive outpatient program ( IOP), she was recently enrolled with partial hospitalization with sporadic attendance and limited engagement during group sessions. Report ongoing stressor with her employment, however states her church  family has been a great support.    Progress (rationale): Ongoing, Patient to keep follow-up with Psychiatrist [facility] 01/14/2018 and Eloise Levels, [facility]  Medications refill was offer at discharge, patient declined.   Take all medications as prescribed. Keep all follow-up appointments as scheduled.  Do not consume alcohol or use illegal drugs while on prescription medications. Report any adverse effects from your medications to your primary care provider promptly.  In the event of recurrent symptoms or worsening symptoms, call 911, a crisis hotline, or go to the nearest emergency department for evaluation.    Derrill Center, NP 01/02/2018

## 2018-01-03 ENCOUNTER — Other Ambulatory Visit (HOSPITAL_COMMUNITY): Payer: Self-pay

## 2018-01-03 ENCOUNTER — Encounter (INDEPENDENT_AMBULATORY_CARE_PROVIDER_SITE_OTHER): Payer: Self-pay | Admitting: Physical Medicine and Rehabilitation

## 2018-01-03 NOTE — Psych (Signed)
   [facility] PHP THERAPIST PROGRESS NOTE  [name] [id number]  Session Time: 9:00 - 11:00  Participation Level: Active  Behavioral Response: CasualAlertDepressed and Irritable  Type of Therapy: Group Therapy; Psychotherapy  Treatment Goals addressed: Coping  Interventions: CBT, DBT, Solution Focused, Supportive and Reframing  Summary: Clinician led check-in regarding current stressors and situation, and review of patient completed daily inventory. Clinician utilized active listening and empathetic response and validated patient emotions. Clinician facilitated processing group on pertinent issues.   Therapist Response: Hannah [name] is a 57 y.o. female who presents with depression symptoms. Patient arrived within time allowed and reports that she is feeling "annoyed." Patient rates her mood at a 5.5 on a scale of 1-10 with 10 being great. Pt reports she lost power at her house overnight which upset her routine. Pt reports continued adjustment to her medication and feeling groggy. Pt shared she has not told her son about her treatment and has some guilt for it. Pt is able to process this in group. Patient engaged in discussion.      Session Time: 11:00 -12:00   Participation Level: Active   Behavioral Response: CasualAlertDepressed   Type of Therapy: Group Therapy, OT   Treatment Goals addressed: Coping   Interventions: Psychosocial skills training, Supportive,    Summary:  Occupational Therapy group   Therapist Response: Patient engaged in group. See OT note.           Session Time: 12:00 - 12:45  Participation Level: Active  Behavioral Response: CasualAlertDepressed  Type of Therapy: Group Therapy, Activity Therapy  Treatment Goals addressed: Coping  Interventions: Systems analyst, Supportive  Summary:  Reflection Group: Patients encouraged to practice skills and interpersonal techniques or work on mindfulness and relaxation techniques. The  importance of self-care and making skills part of a routine to increase usage were stressed   Therapist Response: Patient engaged and participated appropriately.       Session Time: 12:45 - 2:00   Participation Level: Active   Behavioral Response: CasualAlertDepressed   Type of Therapy: Group Therapy, Psychotherapy; Psychoeducation   Treatment Goals addressed: Coping   Interventions: CBT, Solution focused, Supportive, Reframing   Summary: 12:45 - 1:50 Cln introduced topic of boundaries. Cln provided psychoeducation on what boundaries are, porous, rigid and healthy boundary characteristics, and the different types of boundaries. Group related the information to themselves to begin discovering potential boundary issues.  1:50 -2:00 Clinician led check-out. Clinician assessed for immediate needs, medication compliance and efficacy, and safety concerns      Therapist Response: Patient engaged in group. Pt reports understanding of boundaries and shares she has mainly porous boundaries.  At [place], patient rates her mood at a 7 on a scale of 1-10 with 10 being great. Pt reports afternoon plans of spending time with her cousin and going to church. Patient demonstrates some progress as evidenced by sharing her situation with supports. Patient denies SI/HI/self-harm thoughts at the end of group.       Suicidal/Homicidal: Nowithout intent/plan   Plan: Pt will continue in PHP while working to decrease depression and increase skillfulness to manage symptoms.    Diagnosis: MDD (major depressive disorder), recurrent episode, moderate ([place]) [F33.1]    1. MDD (major depressive disorder), recurrent episode, moderate ([place])       Lorin Glass, LCSW 01/03/2018

## 2018-01-03 NOTE — Psych (Signed)
   [facility] PHP THERAPIST PROGRESS NOTE  Hannah [name] [id number]  Session Time: 9:00 - 11:00  Participation Level: Active  Behavioral Response: CasualAlertDepressed and Irritable  Type of Therapy: Group Therapy; Psychotherapy  Treatment Goals addressed: Coping  Interventions: CBT, DBT, Solution Focused, Supportive and Reframing  Summary: Clinician led check-in regarding current stressors and situation, and review of patient completed daily inventory. Clinician utilized active listening and empathetic response and validated patient emotions. Clinician facilitated processing group on pertinent issues.   Therapist Response: Hannah [name] is a 57 y.o. female who presents with depression symptoms. Patient arrived within time allowed and reports that she is feeling "irritated." Patient rates her mood at a 4 on a scale of 1-10 with 10 being great. Pt reports a mixed weekend with an anxiety attack and feeling exhausted, and also being able to have family time, be out in public, and let a friend talk her through the anxiety. Pt reports continued medication compliance.  Pt is able to process this in group. Patient engaged in discussion.      Session Time: 11:00 -12:00   Participation Level: Active   Behavioral Response: CasualAlertDepressed   Type of Therapy: Group Therapy, OT   Treatment Goals addressed: Coping   Interventions: Psychosocial skills training, Supportive,    Summary:  Occupational Therapy group   Therapist Response: Patient engaged in group. See OT note.           Session Time: 12:00 - 12:45  Participation Level: Active  Behavioral Response: CasualAlertDepressed  Type of Therapy: Group Therapy, Activity Therapy  Treatment Goals addressed: Coping  Interventions: Systems analyst, Supportive  Summary:  Reflection Group: Patients encouraged to practice skills and interpersonal techniques or work on mindfulness and relaxation techniques. The  importance of self-care and making skills part of a routine to increase usage were stressed   Therapist Response: Patient engaged and participated appropriately.       Session Time: 12:45 - 2:00  Participation Level: Alert  Behavioral Response: CasualAlertDepressed  Type of Therapy: Group Therapy, Psychoeducation; Psychotherapy  Treatment Goals addressed: Coping  Interventions: CBT; Solution focused; Supportive; Reframing  Summary: 12:45 - 1:50: Cln discussed how to set and maintain boundaries. Group reviewed handout "How to set boundaries" and walked through steps together.  1:50 -2:00 Clinician led check-out. Clinician assessed for immediate needs, medication compliance and efficacy, and safety concerns   Therapist Response: Patient engaged in activity. Pt reports understanding of how to set boundaries. At [place], patient rates her mood at a 5.5 on a scale of 1-10 with 10 being great. Pt reports afternoon plans to rest.  Patient demonstrates some progress as evidenced by increased ability to work through emotions. Patient denies SI/HI/self-harm thoughts at the end of group.       Suicidal/Homicidal: Nowithout intent/plan   Plan: Pt will continue in PHP while working to decrease depression and increase skillfulness to manage symptoms.    Diagnosis: MDD (major depressive disorder), recurrent episode, moderate ([place]) [F33.1]    1. MDD (major depressive disorder), recurrent episode, moderate ([place])       Lorin Glass, LCSW 01/03/2018

## 2018-01-04 ENCOUNTER — Other Ambulatory Visit (HOSPITAL_COMMUNITY): Payer: Self-pay

## 2018-01-04 ENCOUNTER — Ambulatory Visit (HOSPITAL_COMMUNITY): Payer: Self-pay

## 2018-01-04 NOTE — Psych (Signed)
   [facility] PHP THERAPIST PROGRESS NOTE  Hannah [name] [id number]  Session Time: 9:00 - 11:00  Participation Level: Active  Behavioral Response: CasualAlertDepressed and Irritable  Type of Therapy: Group Therapy; Psychotherapy  Treatment Goals addressed: Coping  Interventions: CBT, DBT, Solution Focused, Supportive and Reframing  Summary: Clinician led check-in regarding current stressors and situation, and review of patient completed daily inventory. Clinician utilized active listening and empathetic response and validated patient emotions. Clinician facilitated processing group on pertinent issues.   Therapist Response: Hannah [name] is a 57 y.o. female who presents with depression symptoms. Patient arrived within time allowed and reports that she is feeling "rested." Patient rates her mood at a 7 on a scale of 1-10 with 10 being great. Pt reports feeling well rested however is still tired. Pt reports she did hair and spent time with her son yesterday. Pt reports improvement in appetite and concentration since joining group and rates depression and anxiety as mild today.  Pt reports continued medication compliance.  Pt is able to process  in group. Patient engaged in discussion.   Pt spoke to cln privately and reports she received a call from her aunt that pt's uncle's health took a sharp decline and they do not think he will be alive much longer. Pt chose to leave group so she could transport her niece to her father. Pt denies SI/HI at time of her leaving.   After pt's departure, pt's chart was reviewed by provider and decision was made to discharge pt due to non-compliance. Provider, Ricky Ala, and cln [name], called pt and informed her of discharge and reasons, namely that she had 3 absences which is grounds for discharge due to group rules signed upon intake. Pt is not in danger to step down from intensive care. Pt is aligned with discharge and will return to previous  outpatient providers.      Suicidal/Homicidal: Nowithout intent/plan   Plan: Pt will discharge from PHP due to non-compliance AEB poor attendance and leaving group early. Pt has appointments with previous providers, Eloise Levels 01/11/18 and Dr Modesta Messing 01/15/18 within this agency. Pt denies SI/HI at time of discharge.    Diagnosis: MDD (major depressive disorder), recurrent episode, moderate ([place]) [F33.1]    1. MDD (major depressive disorder), recurrent episode, moderate ([place])       Lorin Glass, LCSW 01/04/2018

## 2018-01-05 ENCOUNTER — Ambulatory Visit (HOSPITAL_COMMUNITY): Payer: Self-pay

## 2018-01-05 ENCOUNTER — Other Ambulatory Visit (HOSPITAL_COMMUNITY): Payer: Self-pay

## 2018-01-08 ENCOUNTER — Ambulatory Visit (HOSPITAL_COMMUNITY): Payer: Self-pay

## 2018-01-08 ENCOUNTER — Other Ambulatory Visit (HOSPITAL_COMMUNITY): Payer: Self-pay

## 2018-01-09 ENCOUNTER — Other Ambulatory Visit (HOSPITAL_COMMUNITY): Payer: Self-pay

## 2018-01-09 ENCOUNTER — Ambulatory Visit (HOSPITAL_COMMUNITY): Payer: Self-pay

## 2018-01-10 ENCOUNTER — Other Ambulatory Visit (HOSPITAL_COMMUNITY): Payer: Self-pay

## 2018-01-11 ENCOUNTER — Other Ambulatory Visit (HOSPITAL_COMMUNITY): Payer: Self-pay

## 2018-01-11 ENCOUNTER — Ambulatory Visit (HOSPITAL_COMMUNITY): Payer: Self-pay

## 2018-01-11 ENCOUNTER — Ambulatory Visit (INDEPENDENT_AMBULATORY_CARE_PROVIDER_SITE_OTHER): Payer: PRIVATE HEALTH INSURANCE | Admitting: Psychiatry

## 2018-01-11 DIAGNOSIS — F331 Major depressive disorder, recurrent, moderate: Secondary | ICD-10-CM | POA: Diagnosis not present

## 2018-01-12 ENCOUNTER — Telehealth (HOSPITAL_COMMUNITY): Payer: Self-pay | Admitting: Licensed Clinical Social Worker

## 2018-01-12 ENCOUNTER — Ambulatory Visit (HOSPITAL_COMMUNITY): Payer: Self-pay

## 2018-01-12 ENCOUNTER — Other Ambulatory Visit (HOSPITAL_COMMUNITY): Payer: Self-pay

## 2018-01-12 DIAGNOSIS — F339 Major depressive disorder, recurrent, unspecified: Secondary | ICD-10-CM | POA: Insufficient documentation

## 2018-01-12 NOTE — Progress Notes (Signed)
   THERAPIST PROGRESS NOTE  Session Time:3:05-4:00  Participation Level:Active  Behavioral Response:CasualLethargicDepressed  Type of Therapy:Individual Therapy  Treatment Goals addressed:Coping  Interventions:CBT  Summary:Hannah L Geraldis a 57 y.o.femalewho presents with major depressive disorder.   Suicidal/Homicidal:Nowithout intent/plan  Therapist Response:Pt. presents for her first session since discharge from the partial hospitalization program. Pt. Presents with dysphoric mood, lethargic, sad, poor motivation to engage in daily activities. Pt. Does not report SI/HI. Pt. Reports that she could sleep most of everyday, but has been forcing herself to meet her scheduled appointments. Pt. Reports that she continues to engage in hypersomnia and has awareness that she is using sleep as avoidance. Pt. Discussed that she has chronic arthritis and shoulder pain that she manages. Pt. Discussed that she is not sure that her medication is well managed and is scheduled to see her psychiatrist on Monday. Pt. Continues to follow through with communication regarding her short term disability. Pt. Discussed that she is letting go of caregiving responsibilities for her teenage son and husband, realizing that she needs to continue to make herself her primary priority. Pt. Discussed ongoing stressor of workplace dissatisfaction, but feels that the emotion regulation skills that she has learned will assist her with her planned return to work next week.  Plan: Pt to be assessed by [facility] program.  Diagnosis:Major Depressive Episode, Moderate    Nancie Neas, [facility] 01/12/2018

## 2018-01-15 ENCOUNTER — Encounter (HOSPITAL_COMMUNITY): Payer: Self-pay | Admitting: Psychiatry

## 2018-01-15 ENCOUNTER — Ambulatory Visit (INDEPENDENT_AMBULATORY_CARE_PROVIDER_SITE_OTHER): Payer: 59 | Admitting: Psychiatry

## 2018-01-15 ENCOUNTER — Other Ambulatory Visit (HOSPITAL_COMMUNITY): Payer: Self-pay

## 2018-01-15 VITALS — BP 119/81 | HR 70 | Ht 66.0 in | Wt 238.0 lb

## 2018-01-15 DIAGNOSIS — R45851 Suicidal ideations: Secondary | ICD-10-CM | POA: Diagnosis not present

## 2018-01-15 DIAGNOSIS — F331 Major depressive disorder, recurrent, moderate: Secondary | ICD-10-CM | POA: Diagnosis not present

## 2018-01-15 DIAGNOSIS — G4700 Insomnia, unspecified: Secondary | ICD-10-CM

## 2018-01-15 DIAGNOSIS — R450 Nervousness: Secondary | ICD-10-CM

## 2018-01-15 MED ORDER — SERTRALINE HCL 50 MG PO TABS: 50.0000 mg | ORAL_TABLET | Freq: Every day | ORAL | 0 refills | Status: DC

## 2018-01-15 MED ORDER — BUPROPION HCL ER (XL) 150 MG PO TB24: 150.0000 mg | ORAL_TABLET | Freq: Every day | ORAL | 0 refills | Status: DC

## 2018-01-15 MED ORDER — TRAZODONE HCL 50 MG PO TABS: 50.0000 mg | ORAL_TABLET | Freq: Every day | ORAL | 0 refills | Status: DC

## 2018-01-15 NOTE — Patient Instructions (Signed)
1. Continue sertraline 50 mg daily  2. Start Wellbutrin 150 mg daily  3. Continue Trazodone 50 mg at night as needed for sleep 4. Return to clinic in one month for 30 mins  CONTACT INFORMATION  What to do if you need to get in touch with someone regarding a psychiatric issue:  1. EMERGENCY: For psychiatric emergencies (if you are suicidal or if there are any other safety issues) call 911 and/or go to your nearest Emergency Room immediately.   2. IF YOU NEED SOMEONE TO TALK TO RIGHT NOW: Given my clinical responsibilities, I may not be able to speak with you over the phone for a prolonged period of time.  a. You may always call The National Suicide Prevention Lifeline at 1-800-273-TALK 838-559-3310).  b. Your county of residence will also have local crisis services. For [facility]: [place] at [phone] ([place])

## 2018-01-16 ENCOUNTER — Other Ambulatory Visit (HOSPITAL_COMMUNITY): Payer: Self-pay

## 2018-01-16 ENCOUNTER — Ambulatory Visit (HOSPITAL_COMMUNITY): Payer: Self-pay

## 2018-01-17 ENCOUNTER — Other Ambulatory Visit (HOSPITAL_COMMUNITY): Payer: Self-pay

## 2018-01-18 ENCOUNTER — Ambulatory Visit (HOSPITAL_COMMUNITY): Payer: Self-pay

## 2018-01-18 ENCOUNTER — Other Ambulatory Visit (HOSPITAL_COMMUNITY): Payer: Self-pay

## 2018-01-19 ENCOUNTER — Other Ambulatory Visit (HOSPITAL_COMMUNITY): Payer: Self-pay

## 2018-01-19 ENCOUNTER — Ambulatory Visit (HOSPITAL_COMMUNITY): Payer: Self-pay

## 2018-01-22 ENCOUNTER — Other Ambulatory Visit (HOSPITAL_COMMUNITY): Payer: Self-pay

## 2018-01-22 ENCOUNTER — Ambulatory Visit (HOSPITAL_COMMUNITY): Payer: Self-pay

## 2018-01-23 ENCOUNTER — Other Ambulatory Visit (HOSPITAL_COMMUNITY): Payer: Self-pay

## 2018-01-23 ENCOUNTER — Other Ambulatory Visit: Payer: Self-pay

## 2018-01-23 ENCOUNTER — Ambulatory Visit (HOSPITAL_COMMUNITY): Payer: Self-pay

## 2018-01-23 ENCOUNTER — Ambulatory Visit: Payer: Self-pay | Admitting: Nurse Practitioner

## 2018-01-24 ENCOUNTER — Other Ambulatory Visit (HOSPITAL_COMMUNITY): Payer: Self-pay

## 2018-01-25 ENCOUNTER — Ambulatory Visit (INDEPENDENT_AMBULATORY_CARE_PROVIDER_SITE_OTHER): Payer: PRIVATE HEALTH INSURANCE | Admitting: Psychiatry

## 2018-01-25 DIAGNOSIS — F330 Major depressive disorder, recurrent, mild: Secondary | ICD-10-CM | POA: Diagnosis not present

## 2018-01-25 NOTE — Progress Notes (Signed)
   THERAPIST PROGRESS NOTE Session Time:2:10-3:00  Participation Level:Active  Behavioral Response:CasualLethargicMildlyDepressed  Type of Therapy:Individual Therapy  Treatment Goals addressed:Coping  Interventions:CBT  Summary:Hannah [name] a 57  y.o.femalewho presents with major depressive disorder.   Suicidal/Homicidal:Nowithout intent/plan  Therapist Response:Treatment plan was completed in today's session. Pt.presents with significantly improved mood. Pt. Reports that she is sleeping less and better able to focus on her self care. Session focused on strategies for planned return to work following next psychiatric appointment. Pt. Discussed that she was taking new medication and she feels that it is helping. Pt. Discussed patterns of low self-esteem regarding professional contributions, despite knowing her worth with her children and family. Pt. Discussed tendency to underrate her skills and abilities which and not apply for jobs because of her fear of rejection. Pt. Was not able to identify the origination of this thought. Pt. Received challenge from therapist that a place for her to work is to routinely apply for positions and to sit with the discomfort of possible rejection so that she can raise her tolerance for rejection. Pt. Indicated that she thought this would be hard for her, but understood that it was an important part of hr recovery process.   Plan:Pt. To return for CBT based therapy in two weeks  Diagnosis:Major Depressive Episode, Moderate     Nancie Neas, [facility] 01/25/2018

## 2018-02-06 NOTE — Progress Notes (Signed)
[facility] MD/PA/NP OP Progress Note  02/15/2018 8:40 AM TORRI MICHALSKI  MRN:  [id number]  Chief Complaint:  Chief Complaint    Follow-up; Depression     HPI:  Patient presents for follow-up appointment for depression.  She states that she has been feeling much better since the last appointment.  She makes sure to take a walk every day for 30 minutes.  She also notices that she does better if she goes to bed earlier.  She has been trying to work on sleep hygiene to be prepared for work.  Her maternal uncle deceased a few days ago from cancer.  She had flashback of loss of her mother, referring that she also took care of her mother at home.  She tries to be prepared for herself for funeral as she knows that she tends to get overwhelmed and anxious when there are group of people.  She agrees that now she has more awareness about her feeding.  She reports passive SI and imagining her to be deceased as she is undergoing evaluation for potential bone malignancy, although she denies any active plan/intent. She sleeps better. She feels fatigue. She feels less depressed. She has fair concentration (although she occasionally pauses for word finding difficulty). She feels less anxious. She denies panic attacks. She has not started Wellbutrin as she was scared of taking medication, with concern for cancer.  She now feels ready to take it next week.  She also feels ready to go back to work next month, working half day.  Wt Readings from Last 3 Encounters:  02/15/18 235 lb (106.6 kg)  01/15/18 238 lb (108 kg)  12/25/17 245 lb (111.1 kg)    Visit Diagnosis:    ICD-10-CM   1. MDD (major depressive disorder), recurrent episode, moderate ([place]) F33.1     Past Psychiatric History: Please see initial evaluation for full details. I have reviewed the history. No updates at this time.     Past Medical History:  Past Medical History:  Diagnosis Date  . Allergy   . Anemia   . Anxiety   . Arthritis   . Asthma   .  Depression   . Hypertension     Past Surgical History:  Procedure Laterality Date  . CHOLECYSTECTOMY  1993  . COLONOSCOPY    . 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 TRIGGER FINGER RELEASE Right 06/18/2014   Procedure: RIGHT LONG FINGER TRIGGER RELEASE;  Surgeon: [name], MD;  Location: [facility];  Service: Orthopedics;  Laterality: Right;  . TUBAL LIGATION    . VAGINAL HYSTERECTOMY Bilateral 11/04/2016   Procedure: HYSTERECTOMY VAGINAL with Bilateral Salpingectomy;  Surgeon: [name], MD;  Location: [facility];  Service: Gynecology;  Laterality: Bilateral;    Family Psychiatric History: Please see initial evaluation for full details. I have reviewed the history. No updates at this time.     Family History:  Family History  Problem Relation Age of Onset  . Cancer Mother        uterine   . Asthma Mother   . Arthritis Daughter   . Depression Maternal Uncle   . Colon cancer Neg Hx     Social History:  Social History   Socioeconomic History  . Marital status: Married    Spouse name: Not on  file  . Number of children: Not on file  . Years of education: Not on file  . Highest education level: Not on file  Occupational History  . Not on file  Social Needs  . Financial resource strain: Not hard at all  . Food insecurity:    Worry: Never true    Inability: Never true  . Transportation needs:    Medical: No    Non-medical: No  Tobacco Use  . Smoking status: Never Smoker  . Smokeless tobacco: Never Used  Substance and Sexual Activity  . Alcohol use: No    Alcohol/week: 0.0 standard drinks  . Drug use: No  . Sexual activity: Yes    Partners: Male    Birth control/protection: Surgical  Lifestyle  . Physical activity:    Days per week: 0 days    Minutes per  session: 0 min  . Stress: Very much  Relationships  . Social connections:    Talks on phone: More than three times a week    Gets together: Once a week    Attends religious service: More than 4 times per year    Active member of club or organization: Yes    Attends meetings of clubs or organizations: More than 4 times per year    Relationship status: Married  Other Topics Concern  . Not on file  Social History Narrative  . Not on file    Allergies:  Allergies  Allergen Reactions  . Sulfur Swelling    Facial swelling     Metabolic Disorder Labs: No results found for: HGBA1C, MPG No results found for: PROLACTIN No results found for: CHOL, TRIG, HDL, CHOLHDL, VLDL, LDLCALC Lab Results  Component Value Date   TSH 1.14 09/05/2017    Therapeutic Level Labs: No results found for: LITHIUM No results found for: VALPROATE No components found for:  CBMZ  Current Medications: Current Outpatient Medications  Medication Sig Dispense Refill  . budesonide-formoterol (SYMBICORT) 160-4.5 MCG/ACT inhaler Inhale 2 puffs into the lungs daily as needed.    Marland Kitchen buPROPion (WELLBUTRIN XL) 150 MG 24 hr tablet Take 1 tablet (150 mg total) by mouth daily. 30 tablet 0  . chlorthalidone (HYGROTON) 25 MG tablet TAKE 1 TABLET BY MOUTH EVERY DAY FOR HTN  3  . ferrous sulfate 325 (65 FE) MG tablet Take 325 mg by mouth 2 (two) times daily with a meal.    . ibuprofen (ADVIL,MOTRIN) 600 MG tablet 600 MG BY MOUTH EVERY 6 HOURS FOR 5 DAYS, THEN EVERY 6 HOURS AS NEEDED FOR PAIN.  DON'T TAKE MOBIC WHILE TAKING IBUPROFEN 60 tablet 0  . naproxen sodium (ALEVE) 220 MG tablet Take 220 mg by mouth as needed.    . sertraline (ZOLOFT) 50 MG tablet Take 1 tablet (50 mg total) by mouth at bedtime. 30 tablet 0  . traZODone (DESYREL) 50 MG tablet Take 1 tablet (50 mg total) by mouth at bedtime. 30 tablet 0  . Vitamin D, Ergocalciferol, (DRISDOL) 50000 units CAPS capsule Take 50,000 Units by mouth every Monday.     .  docusate sodium (COLACE) 100 MG capsule Take 1 capsule (100 mg total) by mouth 2 (two) times daily. (Patient not taking: Reported on 12/25/2017) 10 capsule 0  . Multiple Vitamin (MULTIVITAMIN) tablet Take 1 tablet by mouth daily.    . Potassium (POTASSIMIN PO) Take by mouth.    . potassium chloride SA (K-DUR,KLOR-CON) 20 MEQ tablet Take 1 tablet (20 mEq total) by mouth daily. Take 1  tab twice daily for 3 days then take 1 tab daily (Patient not taking: Reported on 12/25/2017) 33 tablet 0   No current facility-administered medications for this visit.      Musculoskeletal: Strength & Muscle Tone: within normal limits Gait & Station: normal Patient leans: N/A  Psychiatric Specialty Exam: Review of Systems  Psychiatric/Behavioral: Positive for depression and suicidal ideas. Negative for hallucinations, memory loss and substance abuse. The patient is nervous/anxious and has insomnia.   All other systems reviewed and are negative.   Blood pressure 130/84, pulse 77, height 5' 6.75" (1.695 m), weight 235 lb (106.6 kg), last menstrual period 10/22/2016.Body mass index is 37.08 kg/m.  General Appearance: Fairly Groomed  Eye Contact:  Good  Speech:  Clear and Coherent  Volume:  Normal  Mood:  "much better"  Affect:  Appropriate, Congruent and slightly down, but more reactive  Thought Process:  Coherent  Orientation:  Full (Time, Place, and Person)  Thought Content: Logical   Suicidal Thoughts:  Yes.  without intent/plan  Homicidal Thoughts:  No  Memory:  Immediate;   Good  Judgement:  Good  Insight:  Good  Psychomotor Activity:  slightly slow- significantly improving  Concentration:  Concentration: Fair and Attention Span: Fair  Recall:  Good  Fund of Knowledge: Good  Language: Good  Akathisia:  No  Handed:  Right  AIMS (if indicated): not done  Assets:  Communication Skills Desire for Improvement  ADL's:  Intact  Cognition: WNL  Sleep:  Fair   Screenings: GAD-7     Counselor  from 12/25/2017 in [facility]  Total GAD-7 Score  17    PHQ2-9     Counselor from 12/25/2017 in [facility]  PHQ-2 Total Score  6  PHQ-9 Total Score  24       Assessment and Plan:  SARAHY CREEDON is a 57 y.o. year old female with a history of depression, abnormal SPEP,OA,chronic anemia, hypertension, who presents for follow up appointment for MDD (major depressive disorder), recurrent episode, moderate ([place])  # MDD, moderate, recurrent without psychotic features Exam is notable for significantly improved affect, and improvement in psychomotor activity since the last appointment.  She has not started Wellbutrin due to fear of potential side effect, although she now feels ready to take it.  Will start Wellbutrin as adjunctive treatment for depression.  Discussed potential risk of seizure, headache and worsening anxiety.  Will continue sertraline to target depression.  Will continue trazodone for insomnia.  Psychosocial stressors including loss of her mother, demoralization due to her medical condition, and recent loss of her uncle.  Discussed behavioral activation.  Patient is willing to return to work on October 1st. Discussed that she will have transition period to avoid relapse given severity of her symptoms. She agrees to work half day for two to four weeks, then return to full work. This note will be sent to them per patient request.  Plan  I have reviewed and updated plans as below 1. Continue sertraline 50 mg daily (nausea at higher dose) 2. Start Wellbutrin 150 mg daily  3. Continue Trazodone 50 mg at night as needed for sleep 4. Return to clinic in one month for 30 mins - Will send today's note per patient request, write return to work note (out of work until next appt)  The patient demonstrates the following risk factors for suicide: Chronic risk factors for suicide include:psychiatric disorder  ofdepressionand medical illness of OA. Acute  risk factorsfor suicide include: N/A. Protective factorsfor this patient include: positive social support, responsibility to others (children, family), coping skills and hope for the future. Considering these factors, the overall suicide risk at this point appears to below. Patientisappropriate for outpatient follow up.  The duration of this appointment visit was 30 minutes of face-to-face time with the patient.  Greater than 50% of this time was spent in counseling, explanation of  diagnosis, planning of further management, and coordination of care.  Norman Clay, MD 02/15/2018, 8:40 AM

## 2018-02-08 ENCOUNTER — Ambulatory Visit (HOSPITAL_COMMUNITY): Payer: Self-pay | Admitting: Psychiatry

## 2018-02-12 ENCOUNTER — Ambulatory Visit (INDEPENDENT_AMBULATORY_CARE_PROVIDER_SITE_OTHER): Payer: PRIVATE HEALTH INSURANCE | Admitting: Psychiatry

## 2018-02-12 DIAGNOSIS — F331 Major depressive disorder, recurrent, moderate: Secondary | ICD-10-CM

## 2018-02-12 NOTE — Progress Notes (Signed)
   THERAPIST PROGRESS NOTE  Session Time:10:10-11:00  Participation Level:Active  Behavioral Response:CasualLethargicEuthymic  Type of Therapy:Individual Therapy  Treatment Goals addressed:Coping  Interventions:CBT  Summary:Hannah [name] a 57  y.o.femalewho presents with major depressive disorder.   Suicidal/Homicidal:Nowithout intent/plan  Therapist Response:Pt. Presented with bright affect, talkative, engaged in therapy process. Pt. Discussed that her maternal uncle died this morning, but she was coping with his passing well because she knew that he was no longer in pain. Pt. Discussed that she got up this morning and walked for 30 minutes and posted on FB her gratitude to many family members who had taught her how to survive adversity. Pt. Discussed recent meaningful interaction with her son and husband as evidence of creating healthy boundaries and reversing patterns of overextending herself to family and excessive caregiving. Pt. Discussed that she completed decoration of her "she shed" and was happy with the colors, organization and focus on her personal interests and self-care. Pt. Discussed plan for return to work next Monday following visit with psychiatrist. Pt. Discussed plan for returning to work and continuing to not overextend herself and focus on her emotional health and self-care and choosing not to focus on toxic environment. Counselor discussed that this would be our last session and discussed transition plan to new therapist. Pt. [facility] and Nannette Funderburke as follow-up.   Plan:Pt. Referred to Youlanda Roys and Frederich Balding for follow-up.  Diagnosis:Major Depressive Episode, Moderate  Nancie Neas, [facility] 02/12/2018

## 2018-02-15 ENCOUNTER — Encounter (HOSPITAL_COMMUNITY): Payer: Self-pay | Admitting: Psychiatry

## 2018-02-15 ENCOUNTER — Ambulatory Visit (INDEPENDENT_AMBULATORY_CARE_PROVIDER_SITE_OTHER): Payer: 59 | Admitting: Psychiatry

## 2018-02-15 VITALS — BP 130/84 | HR 77 | Ht 66.75 in | Wt 235.0 lb

## 2018-02-15 DIAGNOSIS — F331 Major depressive disorder, recurrent, moderate: Secondary | ICD-10-CM

## 2018-02-15 DIAGNOSIS — G4700 Insomnia, unspecified: Secondary | ICD-10-CM | POA: Diagnosis not present

## 2018-02-15 DIAGNOSIS — Z818 Family history of other mental and behavioral disorders: Secondary | ICD-10-CM | POA: Diagnosis not present

## 2018-02-15 MED ORDER — SERTRALINE HCL 50 MG PO TABS: 50.0000 mg | ORAL_TABLET | Freq: Every day | ORAL | 0 refills | Status: DC

## 2018-02-15 MED ORDER — TRAZODONE HCL 50 MG PO TABS: 50.0000 mg | ORAL_TABLET | Freq: Every day | ORAL | 0 refills | Status: DC

## 2018-02-15 NOTE — Patient Instructions (Signed)
1. Continue sertraline 50 mg daily (nausea at higher dose) 2. Start Wellbutrin 150 mg daily  3. Continue Trazodone 50 mg at night as needed for sleep 4. Return to clinic in one month for 30 mins

## 2018-02-16 ENCOUNTER — Other Ambulatory Visit (HOSPITAL_COMMUNITY): Payer: Self-pay | Admitting: Psychiatry

## 2018-02-16 MED ORDER — TRAZODONE HCL 50 MG PO TABS: 50.0000 mg | ORAL_TABLET | Freq: Every day | ORAL | 0 refills | Status: DC

## 2018-02-16 MED ORDER — SERTRALINE HCL 50 MG PO TABS: 50.0000 mg | ORAL_TABLET | Freq: Every day | ORAL | 0 refills | Status: DC

## 2018-02-21 ENCOUNTER — Inpatient Hospital Stay: Payer: 59 | Attending: Hematology

## 2018-02-21 ENCOUNTER — Telehealth (HOSPITAL_COMMUNITY): Payer: Self-pay | Admitting: *Deleted

## 2018-02-21 DIAGNOSIS — D472 Monoclonal gammopathy: Secondary | ICD-10-CM

## 2018-02-21 DIAGNOSIS — D509 Iron deficiency anemia, unspecified: Secondary | ICD-10-CM

## 2018-02-21 LAB — CBC WITH DIFFERENTIAL (CANCER CENTER ONLY)
Basophils Absolute: 0 10*3/uL (ref 0.0–0.1)
Basophils Relative: 0 %
EOS ABS: 0 10*3/uL (ref 0.0–0.5)
Eosinophils Relative: 1 %
HEMATOCRIT: 34.4 % — AB (ref 34.8–46.6)
HEMOGLOBIN: 11.2 g/dL — AB (ref 11.6–15.9)
Lymphocytes Relative: 24 %
Lymphs Abs: 1.5 10*3/uL (ref 0.9–3.3)
MCH: 23.4 pg — AB (ref 25.1–34.0)
MCHC: 32.5 g/dL (ref 31.5–36.0)
MCV: 72.1 fL — AB (ref 79.5–101.0)
Monocytes Absolute: 0.3 10*3/uL (ref 0.1–0.9)
Monocytes Relative: 5 %
NEUTROS PCT: 70 %
Neutro Abs: 4.3 10*3/uL (ref 1.5–6.5)
Platelet Count: 199 10*3/uL (ref 145–400)
RBC: 4.78 MIL/uL (ref 3.70–5.45)
RDW: 16 % — ABNORMAL HIGH (ref 11.2–14.5)
WBC Count: 6.1 10*3/uL (ref 3.9–10.3)

## 2018-02-21 LAB — CMP (CANCER CENTER ONLY)
ALT: 12 U/L (ref 0–44)
AST: 14 U/L — AB (ref 15–41)
Albumin: 3.6 g/dL (ref 3.5–5.0)
Alkaline Phosphatase: 72 U/L (ref 38–126)
Anion gap: 8 (ref 5–15)
BUN: 15 mg/dL (ref 6–20)
CO2: 34 mmol/L — ABNORMAL HIGH (ref 22–32)
CREATININE: 0.83 mg/dL (ref 0.44–1.00)
Calcium: 9.1 mg/dL (ref 8.9–10.3)
Chloride: 101 mmol/L (ref 98–111)
GFR, Estimated: >60 mL/min (ref >60)
Glucose, Bld: 97 mg/dL (ref 70–99)
Potassium: 3.4 mmol/L — ABNORMAL LOW (ref 3.5–5.1)
SODIUM: 143 mmol/L (ref 135–145)
Total Bilirubin: 0.5 mg/dL (ref 0.3–1.2)
Total Protein: 7.7 g/dL (ref 6.5–8.1)

## 2018-02-21 LAB — FERRITIN: FERRITIN: 65 ng/mL (ref 11–307)

## 2018-02-21 NOTE — Telephone Encounter (Signed)
Printed last office visit to send to [facility]

## 2018-02-23 LAB — HEMOGLOBINOPATHY EVALUATION
HGB A: 64.7 % — AB (ref 96.4–98.8)
HGB S QUANTITAION: 0 %
HGB VARIANT: 0 %
Hgb A2 Quant: 2.9 % (ref 1.8–3.2)
Hgb C: 32.4 % — ABNORMAL HIGH
Hgb F Quant: 0 % (ref 0.0–2.0)

## 2018-02-25 NOTE — Progress Notes (Signed)
[facility]  [phone] [phone]  Clinic Follow-up Note   Patient Care Team: Hannah [name], Hannah Him, MD as PCP - General (Internal Medicine) Hannah Merino, MD as Consulting Physician (Rheumatology) 02/28/2018  CHIEF COMPLAINTS:  Follow-up anemia and smoldering multiple myeloma   HISTORY OF PRESENTING ILLNESS (10/24/2017):  Hannah [name] 57 y.o. female is here because of abnormal blood work. She was referred by her rheumatologist, Dr. Estanislado [name]. She presents alone for today's consult. She was found to have abnormal CBC on 01/15/2016 with mild anemia Hgb 10.6, low MCV 71.7, and elevated RDW 16.4%. Her anemia appears chronic; underwent on 11/16/2016 per GYN for menorrhagia.  She has taken iron sporadically for a few months. She was referred to rheumatology on 09/05/17 for evaluation and work up for existing bone/joint pain. She has imaging findings consisted with OA. She has relatively new pelvic and lower back pain within the last 6 months. OA pain is controlled with aleve and extra strength tylenol PRN. She reported to rheumatology few months history of fatigue, which she attributed to recent diagnosis of anxiety and depression, which prompted further lab work. She illness or injury. Sed rate elevated to 53, SPEP with two abnormal protein bands detected, one in the gamma globulins and one in the beta globulins. 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   Past medical history includes anemia on iron therapy, OA of knees, right shoulder, bilateral hands, and bilateral hips, HTN, asthma, depression, and anxiety. She is stable on zoloft and seeing mental health counselor. S/p hysterectomy for menorrhagia in 10/2016 per Dr. Leo [name] and total right knee arthroplasty on 12/2015 per Dr. Erlinda [name]. She lives in her home, independent of all ADLs. She works as a Secondary school teacher. Family history is positive for mother with uterine cancer. No prior personal history  of cancer; she is up to date on routine cancer screenings such as PAP, mammogram, and colonoscopy.   Today she reports few months history of fatigue, she is still able to work and function. Has 72-monthhistory of night sweats which she attributes to menopause. No decreased appetite, fever, chills, or abnormal unexplained weight loss. No recent bleeding, cough, chest pain, dyspnea, n/v/c/d. Mood is controlled. Sleep is fair. Joint pain is controlled with NSAIDs and tylenol PRN. Denies neuropathy or skin rash.   CURRENT THERAPY: Surveillance and oral iron   INTERVAL HISTORY: .Hannah HUESCAis a 57y.o. female who is here for follow-up. I last saw her 2 months ago. She has been following up with psychiatry in the interim. Today, she is here alone at the clinic. She feels good. Her pain is tolerable now. She ran out of potassium pills and still takes iron pills.  She states that her uncle died from lung cancer. Family history is positive for lung, endometrial and brain cancer. No family history of colon cancer. She follows up with her PCP for health maintenance and cancer screening.   She is curious about her ideal weight as she is exercising and trying to lose weight.   MEDICAL HISTORY:  Past Medical History:  Diagnosis Date  . Allergy   . Anemia   . Anxiety   . Arthritis   . Asthma   . Depression   . Hypertension     SURGICAL HISTORY: Past Surgical History:  Procedure Laterality Date  . CHOLECYSTECTOMY  1993  . COLONOSCOPY    . 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 TOTAL KNEE ARTHROPLASTY Right 01/28/2016  Procedure: RIGHT TOTAL KNEE ARTHROPLASTY;  Surgeon: Hannah Koyanagi, MD;  Location: [facility];  Service: Orthopedics;  Laterality: Right;  . TRIGGER FINGER RELEASE Right 06/18/2014   Procedure: RIGHT LONG FINGER TRIGGER RELEASE;  Surgeon: Hannah Payment, MD;  Location: [facility];  Service: Orthopedics;  Laterality: Right;  . TUBAL LIGATION    . VAGINAL  HYSTERECTOMY Bilateral 11/04/2016   Procedure: HYSTERECTOMY VAGINAL with Bilateral Salpingectomy;  Surgeon: Hannah Manges, MD;  Location: [facility];  Service: Gynecology;  Laterality: Bilateral;    SOCIAL HISTORY: Social History   Socioeconomic History  . Marital status: Married    Spouse name: Not on file  . Number of children: Not on file  . Years of education: Not on file  . Highest education level: Not on file  Occupational History  . Not on file  Social Needs  . Financial resource strain: Not hard at all  . Food insecurity:    Worry: Never true    Inability: Never true  . Transportation needs:    Medical: No    Non-medical: No  Tobacco Use  . Smoking status: Never Smoker  . Smokeless tobacco: Never Used  Substance and Sexual Activity  . Alcohol use: No    Alcohol/week: 0.0 standard drinks  . Drug use: No  . Sexual activity: Yes    Partners: Male    Birth control/protection: Surgical  Lifestyle  . Physical activity:    Days per week: 0 days    Minutes per session: 0 min  . Stress: Very much  Relationships  . Social connections:    Talks on phone: More than three times a week    Gets together: Once a week    Attends religious service: More than 4 times per year    Active member of club or organization: Yes    Attends meetings of clubs or organizations: More than 4 times per year    Relationship status: Married  . Intimate partner violence:    Fear of current or ex partner: No    Emotionally abused: No    Physically abused: No    Forced sexual activity: No  Other Topics Concern  . Not on file  Social History Narrative  . Not on file    FAMILY HISTORY: Family History  Problem Relation Age of Onset  . Cancer Mother        uterine   . Asthma Mother   . Arthritis Daughter   . Depression Maternal Uncle   . Colon cancer Neg Hx     ALLERGIES:  is allergic to sulfur.  MEDICATIONS:  Current Outpatient Medications  Medication Sig Dispense Refill  .  budesonide-formoterol (SYMBICORT) 160-4.5 MCG/ACT inhaler Inhale 2 puffs into the lungs daily as needed.    Marland Kitchen buPROPion (WELLBUTRIN XL) 150 MG 24 hr tablet Take 1 tablet (150 mg total) by mouth daily. 30 tablet 0  . chlorthalidone (HYGROTON) 25 MG tablet TAKE 1 TABLET BY MOUTH EVERY DAY FOR HTN  3  . ferrous sulfate 325 (65 FE) MG tablet Take 325 mg by mouth 2 (two) times daily with a meal.    . ibuprofen (ADVIL,MOTRIN) 600 MG tablet 600 MG BY MOUTH EVERY 6 HOURS FOR 5 DAYS, THEN EVERY 6 HOURS AS NEEDED FOR PAIN.  DON'T TAKE MOBIC WHILE TAKING IBUPROFEN 60 tablet 0  . Multiple Vitamin (MULTIVITAMIN) tablet Take 1 tablet by mouth daily.    . naproxen sodium (ALEVE) 220 MG tablet Take 220  mg by mouth as needed.    . sertraline (ZOLOFT) 50 MG tablet Take 1 tablet (50 mg total) by mouth at bedtime. 90 tablet 0  . traZODone (DESYREL) 50 MG tablet Take 1 tablet (50 mg total) by mouth at bedtime. 90 tablet 0  . Vitamin D, Ergocalciferol, (DRISDOL) 50000 units CAPS capsule Take 50,000 Units by mouth every Monday.     . Potassium (POTASSIMIN PO) Take by mouth.    . potassium chloride SA (K-DUR,KLOR-CON) 20 MEQ tablet Take 1 tablet (20 mEq total) by mouth daily. Take 1 tab twice daily for 3 days then take 1 tab daily 33 tablet 0   No current facility-administered medications for this visit.     REVIEW OF SYSTEMS:   Constitutional: Denies fevers, chills, weight loss Eyes: Denies blurriness of vision, double vision or watery eyes Ears, nose, mouth, throat, and face: Denies mucositis or sore throat Respiratory: Denies cough, dyspnea or wheezes Cardiovascular: Denies palpitation, chest discomfort or lower extremity swelling Gastrointestinal:  Denies nausea, vomiting, constipation, diarrhea, hematochezia, heartburn or change in bowel habits Skin: Denies abnormal skin rashes Lymphatics: Denies new lymphadenopathy or easy bruising Neurological:Denies numbness, tingling or new weaknesses Behavioral/Psych:  Mood is stable, no new changes   MSK: (+) OA related chronic joint pain R shoulder, hips, hands (+) low back pain, tolerable All other systems were reviewed with the patient and are negative.  PHYSICAL EXAMINATION: ECOG PERFORMANCE STATUS: 1 - Symptomatic but completely ambulatory  Vitals:   02/28/18 0818  BP: 127/76  Pulse: 65  Resp: 20  Temp: 98 F (36.7 C)  SpO2: 94%   Filed Weights   02/28/18 0818  Weight: 232 lb 12.8 oz (105.6 kg)    GENERAL:alert, no distress and comfortable SKIN: no rashes or significant lesions EYES: normal, conjunctiva are pink and non-injected, sclera clear OROPHARYNX:no thrush or ulcers   LYMPH:  no palpable cervical, supraclavicular, or axillary lymphadenopathy LUNGS: clear to auscultation with normal breathing effort HEART: regular rate & rhythm and no murmurs and no lower extremity edema ABDOMEN:abdomen soft, non-tender and normal bowel sounds Musculoskeletal:no cyanosis of digits and no clubbing  PSYCH: alert & oriented x 3 with fluent speech NEURO: no focal motor/sensory deficits  LABORATORY DATA:  I have reviewed the data as listed CBC Latest Ref Rng & Units 02/21/2018 11/28/2017 11/13/2017  WBC 3.9 - 10.3 K/uL 6.1 7.7 6.6  Hemoglobin 11.6 - 15.9 g/dL 11.2(L) 11.0(L) 11.4(L)  Hematocrit 34.8 - 46.6 % 34.4(L) 33.6(L) 34.8  Platelets 145 - 400 K/uL 199 222 229    CMP Latest Ref Rng & Units 02/21/2018 11/28/2017 10/24/2017  Glucose 70 - 99 mg/dL 97 90 101  BUN 6 - 20 mg/dL 15 18 11   Creatinine 0.44 - 1.00 mg/dL 0.83 0.97 0.81  Sodium 135 - 145 mmol/L 143 140 138  Potassium 3.5 - 5.1 mmol/L 3.4(L) 3.3(L) 3.0(LL)  Chloride 98 - 111 mmol/L 101 101 99  CO2 22 - 32 mmol/L 34(H) 32 27  Calcium 8.9 - 10.3 mg/dL 9.1 9.1 8.9  Total Protein 6.5 - 8.1 g/dL 7.7 7.9 8.0  Total Bilirubin 0.3 - 1.2 mg/dL 0.5 0.4 0.4  Alkaline Phos 38 - 126 U/L 72 81 76  AST 15 - 41 U/L 14(L) 16 16  ALT 0 - 44 U/L 12 13 13    02/21/2018 Hg pattern revealed Hg C elevated  at 32.4% (normal 0%) and Hg A decreased at 64.7% (normal 96.4-98.8%)   PATHOLOGY 11/13/2017 Bone Marrow Biopsy Surgical Pathology Results  Diagnosis Bone Marrow, Aspirate,Biopsy, and Clot - PLASMA CELL NEOPLASM. - SEE COMMENT. PERIPHERAL BLOOD: - MICROCYTIC ANEMIA. Diagnosis Note The marrow reveals increased monoclonal plasma cells (6% by aspirate, 20% by CD138). Correlation with clinical, radiographic, and laboratory data are required for further classification. Specimen Clinical Information r/o multiple myeloma [rd] Source Bone Marrow, Aspirate,Biopsy, and Clot Microscopic LAB DATA: CBC performed on 11/13/2017 shows: WBC 6.6 K/ul Neutrophils 62% HB 11.4 g/dl Lymphocytes 33% HCT 34.8 % Monocytes 4% MCV 71.6 fL Eosinophils 1% RDW 16.3 % Basophils 0% PLT 229 K/ul PERIPHERAL BLOOD SMEAR: There is a microcytic anemia with occasional target cells and mild rouleaux formation. Leukocytes are present in normal numbers. Circulating plasma cells are not identified. Platelets are present in normal numbers. BONE MARROW ASPIRATE: Spicular and cellular. Erythroid precursors: Present in appropriate proportions. No significant dysplasia. Granulocytic precursors: Present in appropriate proportions. No significant dysplasia. No increase in blasts. Megakaryocytes: Present with occasional hypolobated forms. Lymphocytes/plasma cells: There is a mild increase in plasma cells (6% by manual differential counts) with atypical forms (large forms, prominent nucleoli). The distribution is patchy and some spicules have more plasma cells. Lymphocytes are not increased. TOUCH PREPARATIONS: Similar to aspirate smears. CLOT and BIOPSY: The core biopsy and clot section are normocellular for age (40%). CD138 highlights increased plasma cells (approximately 20%) with small aggregates. By light chain in situ hybridization the plasma cells are kappa restricted. Myeloid and erythroid elements are present in normal  proportions. There are scattered hypolobated megakaryocytes. There are not atypical lymphoid aggregates. IRON STAIN: Iron stains are performed on a bone marrow aspirate smear and section of clot. The controls stained appropriately. Storage Iron: Present. Ringed Sideroblasts: Absent. ADDITIONAL DATA / TESTING: Cytogenetics, including FISH for myeloma, was ordered. Specimen Table Bone Marrow count performed on 500 cells shows: Blasts: 0% Myeloid 54% Promyelocyts: 0% Myelocytes: 9% Erythroid 30% Metamyelocyts: 3% Bands: 16% Lymphocytes: 10% Neutrophils: 24% Eosinophils: 2% Plasma Cells: 6% Basophils: 0% Monocytes: 0% M:E ratio: 1.8 Gross Received in B plus is a 0.5 x 0.4 x 0.1 cm aggregate of tan red soft material, submitted in block A. Also received in B plus in a separate container is a 1.1 x .15 cm core of tan red firm tissue, submitted in block B following decalcification. (SSW:kh 11-13-17) Stain(s) Used in Diagnosis The following stain(s) were used in diagnosing the case: [place], CD 138, KAPPA-ISH. The control(s) stained Appropriately.  11/22/2017 [facility] Cytogenetic Pathology Result    RADIOGRAPHIC STUDIES:  12/20/2017 XR Shoulder Narrative  X-rays demonstrate severe glenohumeral degenerative changes with  periarticular spurring   12/15/2017 MRI Lumbar Spine IMPRESSION: No evidence of myeloma involvement in the region from T12 through S3.  L3-4: Shallow disc protrusion more towards the right. Facet osteoarthritis right worse than left. Findings could certainly be associated with back pain. There is some potential for irritation of the right L3 and L4 nerves as well. Definite neural compression is not demonstrated however.  L4-5: Disc bulge and facet osteoarthritis. Findings could contribute to low back pain.  L5-S1: Minimal disc bulge and facet osteoarthritis.  12/15/2017 MRI Pelvis IMPRESSION: Negative for multiple myeloma.  No acute or focal  abnormality.  Mild bilateral SI joint degenerative change  11/06/2017 DG Bone Survey Met IMPRESSION: Single tiny lucency high in the parietal region of the skull, most likely a vascular channel. Otherwise, negative metastatic bone Survey.  I have personally reviewed the radiological images as listed and agreed with the findings in the report. No results found.  ASSESSMENT &  PLAN:  Hannah [name] is a pleasant 57 year old AA Fwith chronic anemia and abnormal SPEP and IFE.   1.  Smoldering multiple myeloma, IgA Kappa type -We previously reviewed her medical chart including labs and imaging in detail with the patient.  -Her previous lab test showed elevated M protein 1.2g/dl, elevated IgA level, light chain levels were within normal limits and ratio was slightly increased.  She does have mild anemia, likely related to iron deficiency, no renal failure, hypercalcemia, neuropathy, and her previous surgery was negative. -Discussed her bone marrow biopsy findings.  She has increased plasma (6% on aspirate, 20% by CD 138).  I previously discussed with pathologist Dr. Gari Crown, the increased plasma cells in bone marrow is likely between 10 to 20%, and would favor monitoring multiple myeloma than MGUS -Also patient does not meet CRAB for MM, she does have low back and hip pain which started 6-12 months ago. I previously obtained a pelvic and lumbar MRI for further evaluation to see if she has MM bone changes to further rule out MM -Reviewed the natural course of smoldering multiple myeloma, and the risk of transforming to multiple myeloma (5-10% yearly).  I recommend close follow-up by repeating lab every 3 months she agrees. -We discussed that current standard care for smoldering multiple myeloma is close clinical monitoring, although there are clinical trials to evaluate if earlier treatment is needed  -Discussed multiple myeloma related clinical symptoms, and what to watch, pt voiced good  understanding. -Her most recent MRI of the pelvis and lumbar spine didn't reveal evidence of MM I reviewed with her.  -Her pain is now more tolerable. I advised her to call if her pain worsens.  -Labs in 3 months and f/u in 6 months   2. Anemia, hemoglobin C trait -She appears to have chronic mild microcytic anemia, Hgb 9.7 - 11, RDW is elevated; likely a component of iron deficiency. She has been on oral iron 1 tab BID although not consistently.  Iron level showed a mild low transferrin saturation, serum iron and return levels are normal.  We previously discussed the benefits and potential side effect of IV around, she declined.  She would like to continue oral iron -Hemoglobin electrophoresis showed increase in Hg C, consistent with Hg C trait. I discussed the results with her and informed her that her children are at risk of hemoglobin C trait, and I encouraged [name] to be tested. -Labs from 02/21/2018 reviewed, CBC showed Hg 11.2 Hct 34.4, table.  3. HTN -controlled on chlorthalidone.  -followed by PCP   4. OA -She has imaging evidence consistent with OA in multiple joints. Pain currently controlled with NSAIDs and tylenol PRN.  -she was evaluated by rheumatology, will f/u there on PRN basis   5. Anxiety, depression -under care of [facility] provider, stable and tolerating zoloft   6. Hypokalemia -likely secondary to diuretic; -she is on KCL supplement -She states that she ran out of potassium pills, but is trying to take high potassium food. -Labs from 02/21/2018 reviewed, CMP showed K 3.4 -I refilled today, she will take potassium 2 to 3 tablets a week for a few months and increase her K intake in diet   7. Obesity -She is trying to lose weight and exercise  -I offered to refer her to a weight loss program, but she states that she is trying on her own with diet and exercise.    8. Cancer Screening -f/u with PCP, she has had screening colonoscopy,  and annual mammogram  PLAN: - continue  iron pills once daily  -refilled potassium today -Labs today and in 3 months -F/u in 6 months with labs one week before.   All questions were answered. The patient knows to call the clinic with any problems, questions or concerns. I spent 20 minutes counseling the patient face to face. The total time spent in the appointment was 25 minutes and more than 50% was on counseling.   Dierdre Searles Dweik am acting as scribe for Dr. Truitt Merle.  I have reviewed the above documentation for accuracy and completeness, and I agree with the above.    Truitt Merle, MD 02/28/2018

## 2018-02-27 DIAGNOSIS — D582 Other hemoglobinopathies: Secondary | ICD-10-CM | POA: Insufficient documentation

## 2018-02-28 ENCOUNTER — Telehealth: Payer: Self-pay

## 2018-02-28 ENCOUNTER — Telehealth (HOSPITAL_COMMUNITY): Payer: Self-pay | Admitting: Psychiatry

## 2018-02-28 ENCOUNTER — Inpatient Hospital Stay: Payer: 59

## 2018-02-28 ENCOUNTER — Encounter: Payer: Self-pay | Admitting: Hematology

## 2018-02-28 ENCOUNTER — Inpatient Hospital Stay: Payer: 59 | Attending: Nurse Practitioner | Admitting: Hematology

## 2018-02-28 VITALS — BP 127/76 | HR 65 | Temp 98.0°F | Resp 20 | Ht 66.75 in | Wt 232.8 lb

## 2018-02-28 DIAGNOSIS — I10 Essential (primary) hypertension: Secondary | ICD-10-CM | POA: Diagnosis not present

## 2018-02-28 DIAGNOSIS — Z90710 Acquired absence of both cervix and uterus: Secondary | ICD-10-CM | POA: Diagnosis not present

## 2018-02-28 DIAGNOSIS — D509 Iron deficiency anemia, unspecified: Secondary | ICD-10-CM | POA: Diagnosis not present

## 2018-02-28 DIAGNOSIS — D472 Monoclonal gammopathy: Secondary | ICD-10-CM

## 2018-02-28 DIAGNOSIS — M5126 Other intervertebral disc displacement, lumbar region: Secondary | ICD-10-CM | POA: Diagnosis not present

## 2018-02-28 DIAGNOSIS — D582 Other hemoglobinopathies: Secondary | ICD-10-CM

## 2018-02-28 DIAGNOSIS — E669 Obesity, unspecified: Secondary | ICD-10-CM | POA: Diagnosis not present

## 2018-02-28 DIAGNOSIS — Z8049 Family history of malignant neoplasm of other genital organs: Secondary | ICD-10-CM | POA: Insufficient documentation

## 2018-02-28 DIAGNOSIS — M545 Low back pain: Secondary | ICD-10-CM | POA: Insufficient documentation

## 2018-02-28 DIAGNOSIS — F419 Anxiety disorder, unspecified: Secondary | ICD-10-CM | POA: Insufficient documentation

## 2018-02-28 DIAGNOSIS — C9000 Multiple myeloma not having achieved remission: Secondary | ICD-10-CM | POA: Diagnosis present

## 2018-02-28 DIAGNOSIS — E876 Hypokalemia: Secondary | ICD-10-CM | POA: Insufficient documentation

## 2018-02-28 DIAGNOSIS — M170 Bilateral primary osteoarthritis of knee: Secondary | ICD-10-CM | POA: Insufficient documentation

## 2018-02-28 DIAGNOSIS — M47897 Other spondylosis, lumbosacral region: Secondary | ICD-10-CM | POA: Diagnosis not present

## 2018-02-28 DIAGNOSIS — Z801 Family history of malignant neoplasm of trachea, bronchus and lung: Secondary | ICD-10-CM | POA: Diagnosis not present

## 2018-02-28 DIAGNOSIS — J45909 Unspecified asthma, uncomplicated: Secondary | ICD-10-CM | POA: Insufficient documentation

## 2018-02-28 DIAGNOSIS — F329 Major depressive disorder, single episode, unspecified: Secondary | ICD-10-CM | POA: Diagnosis not present

## 2018-02-28 DIAGNOSIS — Z79899 Other long term (current) drug therapy: Secondary | ICD-10-CM | POA: Diagnosis not present

## 2018-02-28 DIAGNOSIS — Z96651 Presence of right artificial knee joint: Secondary | ICD-10-CM | POA: Insufficient documentation

## 2018-02-28 DIAGNOSIS — N920 Excessive and frequent menstruation with regular cycle: Secondary | ICD-10-CM | POA: Diagnosis not present

## 2018-02-28 MED ORDER — POTASSIUM CHLORIDE CRYS ER 20 MEQ PO TBCR: 20.0000 meq | EXTENDED_RELEASE_TABLET | Freq: Every day | ORAL | 0 refills | Status: DC

## 2018-02-28 NOTE — Telephone Encounter (Signed)
Printed avs and calender of upcoming appointment. Per 10/2 los 

## 2018-03-01 LAB — KAPPA/LAMBDA LIGHT CHAINS
Kappa free light chain: 18.1 mg/L (ref 3.3–19.4)
Kappa, lambda light chain ratio: 2.92 — ABNORMAL HIGH (ref 0.26–1.65)
LAMDA FREE LIGHT CHAINS: 6.2 mg/L (ref 5.7–26.3)

## 2018-03-02 LAB — MULTIPLE MYELOMA PANEL, SERUM
ALBUMIN/GLOB SERPL: 1.1 (ref 0.7–1.7)
ALPHA 1: 0.2 g/dL (ref 0.0–0.4)
ALPHA2 GLOB SERPL ELPH-MCNC: 0.6 g/dL (ref 0.4–1.0)
Albumin SerPl Elph-Mcnc: 3.6 g/dL (ref 2.9–4.4)
B-Globulin SerPl Elph-Mcnc: 2.2 g/dL — ABNORMAL HIGH (ref 0.7–1.3)
GAMMA GLOB SERPL ELPH-MCNC: 0.6 g/dL (ref 0.4–1.8)
GLOBULIN, TOTAL: 3.6 g/dL (ref 2.2–3.9)
IGG (IMMUNOGLOBIN G), SERUM: 764 mg/dL (ref 700–1600)
IgA: 1699 mg/dL — ABNORMAL HIGH (ref 87–352)
IgM (Immunoglobulin M), Srm: 27 mg/dL (ref 26–217)
M Protein SerPl Elph-Mcnc: 1.3 g/dL — ABNORMAL HIGH
Total Protein ELP: 7.2 g/dL (ref 6.0–8.5)

## 2018-03-07 ENCOUNTER — Telehealth: Payer: Self-pay

## 2018-03-07 NOTE — Telephone Encounter (Signed)
-----   Message from Truitt Merle, MD sent at 03/07/2018  7:16 AM EDT ----- Please let pt know her myeloma lab is stable, no concerns, thanks   Truitt Merle  03/07/2018

## 2018-03-07 NOTE — Telephone Encounter (Signed)
Spoke with patient per Dr. Burr Medico notified her that the myeloma labs are stable. No concerns, patient verbalized an understanding.

## 2018-03-13 ENCOUNTER — Ambulatory Visit (INDEPENDENT_AMBULATORY_CARE_PROVIDER_SITE_OTHER): Payer: PRIVATE HEALTH INSURANCE | Admitting: Licensed Clinical Social Worker

## 2018-03-13 ENCOUNTER — Encounter (HOSPITAL_COMMUNITY): Payer: Self-pay | Admitting: Licensed Clinical Social Worker

## 2018-03-13 DIAGNOSIS — F331 Major depressive disorder, recurrent, moderate: Secondary | ICD-10-CM

## 2018-03-13 NOTE — Progress Notes (Signed)
IKHLAS ALBO is a 57 y.o. female patient who presents after being referred by Eloise Levels, LPC. Pt has significant recent tx hx w/ this office, attending our PHP, IOP, and individual counseling. She is active,engaged, and warm in session. She describes her current status as "ok" and admits that she is nervous about returning to work full-time. She is currently working half days, per Dr. Modesta Messing, and feels this is "tolerable but she is severely drained" after working. She describes a long hx of needing control of situations, significant unresolved grief for family and friends she has lost this year. She identifies as the oldest child of 3 sisters and states her father will still call her to fix things in her house even though she lives 2 hours away and her sisters live in his same town. Counselor spends time asking about pt's current self care pattern. Pt replies she walks in the mornings and practices mindfulness. Pt wants to work on her focus and her ability to tolerate waiting and/or silence.         Archie Balboa, LCAS-A

## 2018-03-16 NOTE — Progress Notes (Signed)
Hannah [name]/PA/NP OP Progress Note  03/19/2018 3:33 PM Hannah [name]  MRN:  [id number]  Chief Complaint:  Chief Complaint    Follow-up; Depression     HPI:  Patient presents late for follow-up appointment for depression.  She states that she has been feeling better.  She returned to work already this month.  She has been trying to have structure in the day.  She believes taking a walk in the morning helped her mood significantly.  She feels exhausted later in the day, and complains of difficulty in concentration.  She is unsure if she can handle work full day due to her significant fatigue.  She has good relationship with her husband.  She hopes to go out this weekend with him, although she feels nervous being around with people.  Although she misses her mother and her uncle, she does not do well on it as much as she used to.  She sleeps well.  She has more motivation.  She denies SI.  She denies panic attacks.  She has occasional headache after starting Wellbutrin.   Visit Diagnosis:    ICD-10-CM   1. Moderate episode of recurrent major depressive disorder ([place]) F33.1     Past Psychiatric History: Please see initial evaluation for full details. I have reviewed the history. No updates at this time.     Past Medical History:  Past Medical History:  Diagnosis Date  . Allergy   . Anemia   . Anxiety   . Arthritis   . Asthma   . Depression   . Hypertension     Past Surgical History:  Procedure Laterality Date  . CHOLECYSTECTOMY  1993  . COLONOSCOPY    . 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 TOTAL KNEE ARTHROPLASTY Right 01/28/2016   Procedure: RIGHT TOTAL KNEE ARTHROPLASTY;  Surgeon: [name], [name];  Location: [facility];  Service: Orthopedics;  Laterality: Right;  . TRIGGER FINGER RELEASE Right 06/18/2014   Procedure: RIGHT LONG FINGER TRIGGER RELEASE;  Surgeon: [name], [name];  Location: [facility];  Service: Orthopedics;  Laterality: Right;  . TUBAL LIGATION     . VAGINAL HYSTERECTOMY Bilateral 11/04/2016   Procedure: HYSTERECTOMY VAGINAL with Bilateral Salpingectomy;  Surgeon: [name], [name];  Location: [facility];  Service: Gynecology;  Laterality: Bilateral;    Family Psychiatric History: Please see initial evaluation for full details. I have reviewed the history. No updates at this time.     Family History:  Family History  Problem Relation Age of Onset  . Cancer Mother        uterine   . Asthma Mother   . Arthritis Daughter   . Depression Maternal Uncle   . Colon cancer Neg Hx     Social History:  Social History   Socioeconomic History  . Marital status: Married    Spouse name: Not on file  . Number of children: Not on file  . Years of education: Not on file  . Highest education level: Not on file  Occupational History  . Not on file  Social Needs  . Financial resource strain: Not hard at all  . Food insecurity:    Worry: Never true    Inability: Never true  . Transportation needs:    Medical: No    Non-medical: No  Tobacco Use  . Smoking status: Never Smoker  . Smokeless tobacco: Never Used  Substance and Sexual Activity  . Alcohol use: No  Alcohol/week: 0.0 standard drinks  . Drug use: No  . Sexual activity: Yes    Partners: Male    Birth control/protection: Surgical  Lifestyle  . Physical activity:    Days per week: 0 days    Minutes per session: 0 min  . Stress: Very much  Relationships  . Social connections:    Talks on phone: More than three times a week    Gets together: Once a week    Attends religious service: More than 4 times per year    Active member of club or organization: Yes    Attends meetings of clubs or organizations: More than 4 times per year    Relationship status: Married  Other Topics Concern  . Not on file  Social History Narrative  . Not on file    Allergies:  Allergies  Allergen Reactions  . Sulfur Swelling    Facial swelling     Metabolic Disorder Labs: No  results found for: HGBA1C, MPG No results found for: PROLACTIN No results found for: CHOL, TRIG, HDL, CHOLHDL, VLDL, LDLCALC Lab Results  Component Value Date   TSH 1.14 09/05/2017    Therapeutic Level Labs: No results found for: LITHIUM No results found for: VALPROATE No components found for:  CBMZ  Current Medications: Current Outpatient Medications  Medication Sig Dispense Refill  . budesonide-formoterol (SYMBICORT) 160-4.5 MCG/ACT inhaler Inhale 2 puffs into the lungs daily as needed.    Marland Kitchen buPROPion (WELLBUTRIN XL) 150 MG 24 hr tablet Take 1 tablet (150 mg total) by mouth daily. 90 tablet 0  . chlorthalidone (HYGROTON) 25 MG tablet TAKE 1 TABLET BY MOUTH EVERY DAY FOR HTN  3  . ferrous sulfate 325 (65 FE) MG tablet Take 325 mg by mouth 2 (two) times daily with a meal.    . ibuprofen (ADVIL,MOTRIN) 600 MG tablet 600 MG BY MOUTH EVERY 6 HOURS FOR 5 DAYS, THEN EVERY 6 HOURS AS NEEDED FOR PAIN.  DON'T TAKE MOBIC WHILE TAKING IBUPROFEN 60 tablet 0  . Multiple Vitamin (MULTIVITAMIN) tablet Take 1 tablet by mouth daily.    . naproxen sodium (ALEVE) 220 MG tablet Take 220 mg by mouth as needed.    . Potassium (POTASSIMIN PO) Take by mouth.    . potassium chloride SA (K-DUR,KLOR-CON) 20 MEQ tablet Take 1 tablet (20 mEq total) by mouth daily. Take 1 tab twice daily for 3 days then take 1 tab daily 33 tablet 0  . sertraline (ZOLOFT) 50 MG tablet Take 1 tablet (50 mg total) by mouth at bedtime. 90 tablet 0  . traZODone (DESYREL) 50 MG tablet Take 1 tablet (50 mg total) by mouth at bedtime. 90 tablet 0  . Vitamin D, Ergocalciferol, (DRISDOL) 50000 units CAPS capsule Take 50,000 Units by mouth every Monday.      No current facility-administered medications for this visit.      Musculoskeletal: Strength & Muscle Tone: within normal limits Gait & Station: normal Patient leans: N/A  Psychiatric Specialty Exam: Review of Systems  Psychiatric/Behavioral: Positive for depression. Negative for  hallucinations, memory loss, substance abuse and suicidal ideas. The patient is nervous/anxious. The patient does not have insomnia.   All other systems reviewed and are negative.   Blood pressure 134/83, pulse 81, height 5\' 6"  (1.676 m), weight 232 lb (105.2 kg), last menstrual period 10/22/2016, SpO2 98 %.Body mass index is 37.45 kg/m.  General Appearance: Fairly Groomed  Eye Contact:  Good  Speech:  Clear and Coherent  Volume:  Normal  Mood:  "better"  Affect:  Appropriate, Congruent and slightly down, but reactive  Thought Process:  Coherent  Orientation:  Full (Time, Place, and Person)  Thought Content: Logical   Suicidal Thoughts:  No  Homicidal Thoughts:  No  Memory:  Immediate;   Good  Judgement:  Good  Insight:  Good  Psychomotor Activity:  Normal  Concentration:  Concentration: Fair and Attention Span: Fair  Recall:  Good  Fund of Knowledge: Good  Language: Good  Akathisia:  No  Handed:  Right  AIMS (if indicated): not done  Assets:  Communication Skills Desire for Improvement  ADL's:  Intact  Cognition: WNL  Sleep:  Good   Screenings: GAD-7     Counselor from 12/25/2017 in [place]  Total GAD-7 Score  17    PHQ2-9     Counselor from 12/25/2017 in [place]  PHQ-2 Total Score  6  PHQ-9 Total Score  24       Assessment and Plan:  Hannah [name] is a 57 y.o. year old female with a history of depression, abnormal SPEP,OA,chronic anemia, hypertension,, who presents for follow up appointment for Moderate episode of recurrent major depressive disorder ([place])  # MDD, moderate, recurrent without psychotic features Exam is notable for reactive affect, and patient reports steady improvement in neurovegetative symptoms since the last appointment.  Psychosocial stressors including loss of her mother, demoralization due to medical condition, and recent loss of her uncle.  Will continue  Wellbutrin at the current dose as adjunctive treatment for depression.  May consider up titration in the future if she continues to have residual mood symptoms.  She has no known history of seizure.  Will continue to monitor headache.  Will continue sertraline to target depression.  Will continue trazodone as needed for insomnia.  Discussed behavioral activation.   I would support that patient remain working half days until the next appointment given she is still at risk of relapsing in depression. Although her psychomotor activity is significantly improved, she still has difficulty in concentration and endorses fatigue, which interferes with her ability to work.   Plan  I have reviewed and updated plans as below 1.Continue sertraline 50 mg daily (nausea at higher dose) 2. Continue Wellbutrin 150 mg daily  3. Continue Trazodone 50 mg at night as needed for sleep 4.Return to clinic in one month for 15 mins  - Will send today's note per patient request, write return to work note (out of work until next appt)  The patient demonstrates the following risk factors for suicide: Chronic risk factors for suicide include:psychiatric disorder ofdepressionand medical illness of OA. Acute risk factorsfor suicide include: N/A. Protective factorsfor this patient include: positive social support, responsibility to others (children, family), coping skills and hope for the future. Considering these factors, the overall suicide risk at this point appears to below. Patientisappropriate for outpatient follow up.  Norman Clay, [name] 03/19/2018, 3:33 PM

## 2018-03-19 ENCOUNTER — Encounter (HOSPITAL_COMMUNITY): Payer: Self-pay | Admitting: Psychiatry

## 2018-03-19 ENCOUNTER — Ambulatory Visit (INDEPENDENT_AMBULATORY_CARE_PROVIDER_SITE_OTHER): Payer: PRIVATE HEALTH INSURANCE | Admitting: Psychiatry

## 2018-03-19 ENCOUNTER — Ambulatory Visit (HOSPITAL_COMMUNITY): Payer: 59 | Admitting: Psychiatry

## 2018-03-19 VITALS — BP 134/83 | HR 81 | Ht 66.0 in | Wt 232.0 lb

## 2018-03-19 DIAGNOSIS — F419 Anxiety disorder, unspecified: Secondary | ICD-10-CM | POA: Diagnosis not present

## 2018-03-19 DIAGNOSIS — F331 Major depressive disorder, recurrent, moderate: Secondary | ICD-10-CM

## 2018-03-19 MED ORDER — BUPROPION HCL ER (XL) 150 MG PO TB24: 150.0000 mg | ORAL_TABLET | Freq: Every day | ORAL | 0 refills | Status: DC

## 2018-03-19 NOTE — Patient Instructions (Addendum)
1.Continue sertraline 50 mg daily  2. Continue Wellbutrin 150 mg daily  3. Continue Trazodone 50 mg at night as needed for sleep 4.Return to clinic in one month for 15 mins

## 2018-03-23 ENCOUNTER — Other Ambulatory Visit: Payer: Self-pay | Admitting: Hematology

## 2018-03-23 ENCOUNTER — Telehealth (HOSPITAL_COMMUNITY): Payer: Self-pay

## 2018-03-23 DIAGNOSIS — E876 Hypokalemia: Secondary | ICD-10-CM

## 2018-03-23 NOTE — Telephone Encounter (Signed)
Left voice message to contact the office.

## 2018-03-23 NOTE — Telephone Encounter (Signed)
Patients disability company calling with questions about patients work status. Please return call at [phone]

## 2018-03-29 NOTE — Telephone Encounter (Signed)
Dr Modesta Messing Dr Tora Kindred requesting  PEER TO PEER review for [place] Return call @ [phone]

## 2018-03-29 NOTE — Telephone Encounter (Signed)
Left voice message to contact the office. If I am not available, please advise them to schedule review when I have open slot for 15 mins.

## 2018-03-30 NOTE — Telephone Encounter (Signed)
Dr Cecile Sheerer ( covering Dr Philbert Riser) Peer  To Peer for Short Term Disability   Has to be done today To Close- Out Case Phone #  [phone].

## 2018-03-30 NOTE — Telephone Encounter (Signed)
Discussed with Dr. Cecile Sheerer. Discussed the case. If the patient has any questions about process in the future, contact [phone]

## 2018-04-13 NOTE — Progress Notes (Addendum)
BH MD/PA/NP OP Progress Note  04/19/2018 1:43 PM SEVEN MARENGO  MRN:  [id number]  Chief Complaint:  Chief Complaint    Follow-up; Depression     HPI:  Patient presents late for follow-up appointment for depression.  She states that she has not been doing well.  She went back to work full-time last week.  She has not been functioning well due to her difficulty in concentration.  She feels exhausted.  She goes to bed right after she returns from work.  She had a fair day on her wedding anniversary, visiting her family.  She sleeps well with trazodone.  She feels fatigue.  She has fair appetite.  She denies SI.  She feels anxious and tense.  She denies panic attacks.    Visit Diagnosis:    ICD-10-CM   1. MDD (major depressive disorder), recurrent episode, moderate ([place]) F33.1     Past Psychiatric History: Please see initial evaluation for full details. I have reviewed the history. No updates at this time.     Past Medical History:  Past Medical History:  Diagnosis Date  . Allergy   . Anemia   . Anxiety   . Arthritis   . Asthma   . Depression   . Hypertension     Past Surgical History:  Procedure Laterality Date  . CHOLECYSTECTOMY  1993  . COLONOSCOPY    . 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 TRIGGER FINGER RELEASE Right 06/18/2014   Procedure: RIGHT LONG FINGER TRIGGER RELEASE;  Surgeon: [name], MD;  Location: [facility];  Service: Orthopedics;  Laterality: Right;  . TUBAL LIGATION    . VAGINAL HYSTERECTOMY Bilateral 11/04/2016   Procedure: HYSTERECTOMY VAGINAL with Bilateral Salpingectomy;  Surgeon: [name], MD;  Location: [facility];  Service: Gynecology;  Laterality: Bilateral;    Family Psychiatric History: Please see initial evaluation for full details. I  have reviewed the history. No updates at this time.     Family History:  Family History  Problem Relation Age of Onset  . Cancer Mother        uterine   . Asthma Mother   . Arthritis Daughter   . Depression Maternal Uncle   . Colon cancer Neg Hx     Social History:  Social History   Socioeconomic History  . Marital status: Married    Spouse name: Not on file  . Number of children: Not on file  . Years of education: Not on file  . Highest education level: Not on file  Occupational History  . Not on file  Social Needs  . Financial resource strain: Not hard at all  . Food insecurity:    Worry: Never true    Inability: Never true  . Transportation needs:    Medical: No    Non-medical: No  Tobacco Use  . Smoking status: Never Smoker  . Smokeless tobacco: Never Used  Substance and Sexual Activity  . Alcohol use: No    Alcohol/week: 0.0 standard drinks  . Drug use: No  . Sexual activity: Yes    Partners: Male    Birth control/protection: Surgical  Lifestyle  . Physical activity:    Days per week: 0 days    Minutes per session: 0 min  . Stress: Very much  Relationships  .  Social connections:    Talks on phone: More than three times a week    Gets together: Once a week    Attends religious service: More than 4 times per year    Active member of club or organization: Yes    Attends meetings of clubs or organizations: More than 4 times per year    Relationship status: Married  Other Topics Concern  . Not on file  Social History Narrative  . Not on file    Allergies:  Allergies  Allergen Reactions  . Sulfur Swelling    Facial swelling     Metabolic Disorder Labs: No results found for: HGBA1C, MPG No results found for: PROLACTIN No results found for: CHOL, TRIG, HDL, CHOLHDL, VLDL, LDLCALC Lab Results  Component Value Date   TSH 1.14 09/05/2017    Therapeutic Level Labs: No results found for: LITHIUM No results found for: VALPROATE No components  found for:  CBMZ  Current Medications: Current Outpatient Medications  Medication Sig Dispense Refill  . budesonide-formoterol (SYMBICORT) 160-4.5 MCG/ACT inhaler Inhale 2 puffs into the lungs daily as needed.    Marland Kitchen buPROPion (WELLBUTRIN XL) 150 MG 24 hr tablet Take 1 tablet (150 mg total) by mouth daily. 90 tablet 0  . chlorthalidone (HYGROTON) 25 MG tablet TAKE 1 TABLET BY MOUTH EVERY DAY FOR HTN  3  . ferrous sulfate 325 (65 FE) MG tablet Take 325 mg by mouth 2 (two) times daily with a meal.    . ibuprofen (ADVIL,MOTRIN) 600 MG tablet 600 MG BY MOUTH EVERY 6 HOURS FOR 5 DAYS, THEN EVERY 6 HOURS AS NEEDED FOR PAIN.  DON'T TAKE MOBIC WHILE TAKING IBUPROFEN 60 tablet 0  . KLOR-CON M20 20 MEQ tablet TAKE 1 TABLET (20 MEQ TOTAL) BY MOUTH DAILY. TAKE 1 TAB TWICE DAILY FOR 3 DAYS THEN TAKE 1 TAB DAILY 33 tablet 0  . Multiple Vitamin (MULTIVITAMIN) tablet Take 1 tablet by mouth daily.    . naproxen sodium (ALEVE) 220 MG tablet Take 220 mg by mouth as needed.    . Potassium (POTASSIMIN PO) Take by mouth.    Derrill Memo ON 05/16/2018] sertraline (ZOLOFT) 50 MG tablet Take 1 tablet (50 mg total) by mouth at bedtime. 90 tablet 0  . traZODone (DESYREL) 50 MG tablet Take 1 tablet (50 mg total) by mouth at bedtime. 90 tablet 0  . Vitamin D, Ergocalciferol, (DRISDOL) 50000 units CAPS capsule Take 50,000 Units by mouth every Monday.      No current facility-administered medications for this visit.      Musculoskeletal: Strength & Muscle Tone: within normal limits Gait & Station: normal Patient leans: N/A  Psychiatric Specialty Exam: Review of Systems  Psychiatric/Behavioral: Positive for depression. Negative for hallucinations, memory loss, substance abuse and suicidal ideas. The patient is nervous/anxious. The patient does not have insomnia.   All other systems reviewed and are negative.   Blood pressure (!) 150/96, pulse 67, height 5\' 6"  (1.676 m), weight 232 lb (105.2 kg), last menstrual period  10/22/2016, SpO2 96 %.Body mass index is 37.45 kg/m.  General Appearance: Fairly Groomed  Eye Contact:  Good  Speech:  Clear and Coherent  Volume:  Normal  Mood:  Depressed  Affect:  Appropriate, Congruent and Restricted  Thought Process:  Coherent  Orientation:  Full (Time, Place, and Person)  Thought Content: Logical   Suicidal Thoughts:  No  Homicidal Thoughts:  No  Memory:  Immediate;   Good  Judgement:  Good  Insight:  Fair  Psychomotor Activity:  Normal  Concentration:  Concentration: Good and Attention Span: Good  Recall:  Good  Fund of Knowledge: Good  Language: Good  Akathisia:  No  Handed:  Right  AIMS (if indicated): not done  Assets:  Communication Skills Desire for Improvement  ADL's:  Intact  Cognition: WNL  Sleep:  Fair   Screenings: GAD-7     Counselor from 12/25/2017 in [place]  Total GAD-7 Score  17    PHQ2-9     Counselor from 12/25/2017 in [place]  PHQ-2 Total Score  6  PHQ-9 Total Score  24       Assessment and Plan:  CHANLER SCHREITER is a 57 y.o. year old female with a history of depression, abnormal SPEP,OA,chronic anemia, hypertension, who presents for follow up appointment for MDD (major depressive disorder), recurrent episode, moderate ([place])  # MDD, moderate, recurrent without psychotic features Exam is notable for restricted affect, and patient reports worsening in depressive symptoms since the last appointment.  Psychosocial stressors including loss of her mother, demoralization due to medical condition, and loss of her uncle.  Will do up titration of Wellbutrin to target residual mood symptoms.  She has no known history of seizure.  Discussed potential side effect of worsening anxiety and headache.  Will continue scheduling to target depression.  Will continue trazodone as needed for insomnia.  Discussed behavioral activation.   I would support that she works  half days until the next appointment given her relapse of depression. She has very low energy, difficulty in concentration, and it is difficult for the patient to attend full time at work.   Plan I have reviewed and updated plans as below 1.Continue sertraline 50 mg daily(nausea at higher dose) 2. Increase Wellbutrin 300 mg daily  3. Continue Trazodone 50 mg at night as needed for sleep 4.Return to clinic in one month for 15 mins  - Will send today's note per patient request, write return to work note (out of work until next appt)  The patient demonstrates the following risk factors for suicide: Chronic risk factors for suicide include:psychiatric disorder ofdepressionand medical illness of OA. Acute risk factorsfor suicide include: N/A. Protective factorsfor this patient include: positive social support, responsibility to others (children, family), coping skills and hope for the future. Considering these factors, the overall suicide risk at this point appears to below. Patientisappropriate for outpatient follow up.   Norman Clay, MD 04/19/2018, 1:43 PM

## 2018-04-19 ENCOUNTER — Ambulatory Visit (INDEPENDENT_AMBULATORY_CARE_PROVIDER_SITE_OTHER): Payer: PRIVATE HEALTH INSURANCE | Admitting: Psychiatry

## 2018-04-19 VITALS — BP 150/96 | HR 67 | Ht 66.0 in | Wt 232.0 lb

## 2018-04-19 DIAGNOSIS — F331 Major depressive disorder, recurrent, moderate: Secondary | ICD-10-CM | POA: Diagnosis not present

## 2018-04-19 MED ORDER — SERTRALINE HCL 50 MG PO TABS: 50.0000 mg | ORAL_TABLET | Freq: Every day | ORAL | 0 refills | Status: DC

## 2018-04-19 NOTE — Patient Instructions (Addendum)
1.Continue sertraline 50 mg daily 2. Increase Wellbutrin 300 mg daily  3. Continue Trazodone 50 mg at night as needed for sleep 4.Return to clinic in one month for 15 mins

## 2018-04-20 ENCOUNTER — Ambulatory Visit (HOSPITAL_COMMUNITY): Payer: PRIVATE HEALTH INSURANCE | Admitting: Licensed Clinical Social Worker

## 2018-04-20 ENCOUNTER — Encounter

## 2018-04-20 ENCOUNTER — Telehealth (HOSPITAL_COMMUNITY): Payer: Self-pay

## 2018-04-20 NOTE — Telephone Encounter (Signed)
Reice, could you contact the patient. We did send a note/form to them to request for working half day as requested by the patient yesterday. Verify with her if she would like to pursue this, or any change in plans. Thanks.

## 2018-04-20 NOTE — Telephone Encounter (Signed)
Informed  Patient that our fax machine isn't working . And  hadn'e since 11/21/9 afternoon. Repairman has been working on for 2 hrs

## 2018-04-20 NOTE — Telephone Encounter (Signed)
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 Apparently the paperwork allowed her 1 hour a month for her office visit with the doctor, but she also drives to [place] twice a month to see Wes for therapy. Patient would like this included. She states she also asked for additional accommodations such as extra time or breaks for anxiety, extra time to complete tasks etc. Please review and advise or contact patient, thank you

## 2018-04-30 ENCOUNTER — Ambulatory Visit (INDEPENDENT_AMBULATORY_CARE_PROVIDER_SITE_OTHER): Payer: PRIVATE HEALTH INSURANCE | Admitting: Licensed Clinical Social Worker

## 2018-04-30 ENCOUNTER — Encounter (HOSPITAL_COMMUNITY): Payer: Self-pay | Admitting: Licensed Clinical Social Worker

## 2018-04-30 DIAGNOSIS — F331 Major depressive disorder, recurrent, moderate: Secondary | ICD-10-CM

## 2018-04-30 NOTE — Progress Notes (Signed)
   THERAPIST PROGRESS NOTE  Session Time: 9-10  Participation Level: Active  Behavioral Response: CasualAlertDepressed  Type of Therapy: Individual Therapy  Treatment Goals addressed: Coping  Interventions: CBT and Motivational Interviewing  Summary: Hannah [name] is a 57 y.o. female who presents with MDD.   Pt was active, engaged, and reports feeling that she is "in a fog today" but that it is unusual since the past few weeks have been productive and her mood has been mostly upbeat. She continues to report feeling frustrated and tired w/ low energy at her job and irritation about how to proceed w/ the next 2 years at [employer] before she can retire. Counselor and pt discuss strategies for improving her work experience, her attitude while at [employer], and improving her atmosphere at her desk.  Suicidal/Homicidal: Nowithout intent/plan  Therapist Response: Counselor used open questions, active listening, and supportive encouragement. Counselor asked pt to consider how her "[employer] employee self would coach her [employer] employee self". Counselor worked to help pt integrate her energized and tired selves. Pt appears frustrated w/ her lack of creativity in her job. Pt seems to do best when presented w/ challenges and problem solving.   Plan: Return again in 2 weeks.  Diagnosis:    ICD-10-CM   1. MDD (major depressive disorder), recurrent episode, moderate ([place]) F33.1       Archie Balboa, LCAS-A 04/30/2018

## 2018-05-14 NOTE — Progress Notes (Signed)
[facility] [facility] MD/PA/NP OP Progress Note  05/21/2018 10:10 AM Hannah [name]  MRN:  [id number]  Chief Complaint:  Chief Complaint    Follow-up; Depression     HPI:  Patient presents for follow-up appointment for depression.  She states that she started to increase the dose of Wellbutrin this week.  She denies any side effect from the medication.  She feels comfortable working half days.  She feels apprehensive if she were not to return to work full-time.  She has been trying to stay up during the day so that she has good sleep at night.  She denies insomnia.  She feels fatigue.  She feels less depressed.  She has a little more energy.  She denies SI.  She feels anxious and tense.  She had a panic attack when she had conversation with her supervisor the other day. She wants to use noise cancelling headphone as she feels distracted at work.   Wt Readings from Last 3 Encounters:  05/21/18 227 lb (103 kg)  04/19/18 232 lb (105.2 kg)  03/19/18 232 lb (105.2 kg)    Visit Diagnosis:    ICD-10-CM   1. MDD (major depressive disorder), recurrent episode, moderate ([place]) F33.1     Past Psychiatric History: Please see initial evaluation for full details. I have reviewed the history. No updates at this time.     Past Medical History:  Past Medical History:  Diagnosis Date  . Allergy   . Anemia   . Anxiety   . Arthritis   . Asthma   . Depression   . Hypertension     Past Surgical History:  Procedure Laterality Date  . CHOLECYSTECTOMY  1993  . COLONOSCOPY    . 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 TOTAL KNEE ARTHROPLASTY Right 01/28/2016   Procedure: RIGHT TOTAL KNEE ARTHROPLASTY;  Surgeon: [name], MD;  Location: [facility];  Service: Orthopedics;  Laterality: Right;  . TRIGGER FINGER RELEASE Right 06/18/2014   Procedure: RIGHT LONG FINGER TRIGGER RELEASE;  Surgeon: [name], MD;  Location: [facility];  Service: Orthopedics;  Laterality: Right;  . TUBAL LIGATION     . VAGINAL HYSTERECTOMY Bilateral 11/04/2016   Procedure: HYSTERECTOMY VAGINAL with Bilateral Salpingectomy;  Surgeon: [name], MD;  Location: [facility];  Service: Gynecology;  Laterality: Bilateral;    Family Psychiatric History: Please see initial evaluation for full details. I have reviewed the history. No updates at this time.     Family History:  Family History  Problem Relation Age of Onset  . Cancer Mother        uterine   . Asthma Mother   . Arthritis Daughter   . Depression Maternal Uncle   . Colon cancer Neg Hx     Social History:  Social History   Socioeconomic History  . Marital status: Married    Spouse name: Not on file  . Number of children: Not on file  . Years of education: Not on file  . Highest education level: Not on file  Occupational History  . Not on file  Social Needs  . Financial resource strain: Not hard at all  . Food insecurity:    Worry: Never true    Inability: Never true  . Transportation needs:    Medical: No    Non-medical: No  Tobacco Use  . Smoking status: Never Smoker  . Smokeless tobacco: Never Used  Substance and Sexual Activity  . Alcohol use: No  Alcohol/week: 0.0 standard drinks  . Drug use: No  . Sexual activity: Yes    Partners: Male    Birth control/protection: Surgical  Lifestyle  . Physical activity:    Days per week: 0 days    Minutes per session: 0 min  . Stress: Very much  Relationships  . Social connections:    Talks on phone: More than three times a week    Gets together: Once a week    Attends religious service: More than 4 times per year    Active member of club or organization: Yes    Attends meetings of clubs or organizations: More than 4 times per year    Relationship status: Married  Other Topics Concern  . Not on file  Social History Narrative  . Not on file    Allergies:  Allergies  Allergen Reactions  . Sulfur Swelling    Facial swelling     Metabolic Disorder Labs: No  results found for: HGBA1C, MPG No results found for: PROLACTIN No results found for: CHOL, TRIG, HDL, CHOLHDL, VLDL, LDLCALC Lab Results  Component Value Date   TSH 1.14 09/05/2017    Therapeutic Level Labs: No results found for: LITHIUM No results found for: VALPROATE No components found for:  CBMZ  Current Medications: Current Outpatient Medications  Medication Sig Dispense Refill  . budesonide-formoterol (SYMBICORT) 160-4.5 MCG/ACT inhaler Inhale 2 puffs into the lungs daily as needed.    Marland Kitchen buPROPion (WELLBUTRIN XL) 300 MG 24 hr tablet Take 1 tablet (300 mg total) by mouth daily. 90 tablet 0  . chlorthalidone (HYGROTON) 25 MG tablet TAKE 1 TABLET BY MOUTH EVERY DAY FOR HTN  3  . ferrous sulfate 325 (65 FE) MG tablet Take 325 mg by mouth 2 (two) times daily with a meal.    . ibuprofen (ADVIL,MOTRIN) 600 MG tablet 600 MG BY MOUTH EVERY 6 HOURS FOR 5 DAYS, THEN EVERY 6 HOURS AS NEEDED FOR PAIN.  DON'T TAKE MOBIC WHILE TAKING IBUPROFEN 60 tablet 0  . KLOR-CON M20 20 MEQ tablet TAKE 1 TABLET (20 MEQ TOTAL) BY MOUTH DAILY. TAKE 1 TAB TWICE DAILY FOR 3 DAYS THEN TAKE 1 TAB DAILY 33 tablet 0  . Multiple Vitamin (MULTIVITAMIN) tablet Take 1 tablet by mouth daily.    . naproxen sodium (ALEVE) 220 MG tablet Take 220 mg by mouth as needed.    . Potassium (POTASSIMIN PO) Take by mouth.    . sertraline (ZOLOFT) 50 MG tablet Take 1 tablet (50 mg total) by mouth at bedtime. 90 tablet 0  . traZODone (DESYREL) 50 MG tablet Take 1 tablet (50 mg total) by mouth at bedtime. 90 tablet 0  . Vitamin D, Ergocalciferol, (DRISDOL) 50000 units CAPS capsule Take 50,000 Units by mouth every Monday.      No current facility-administered medications for this visit.      Musculoskeletal: Strength & Muscle Tone: within normal limits Gait & Station: normal Patient leans: N/A  Psychiatric Specialty Exam: Review of Systems  Psychiatric/Behavioral: Positive for depression. Negative for hallucinations, memory  loss, substance abuse and suicidal ideas. The patient is nervous/anxious. The patient does not have insomnia.   All other systems reviewed and are negative.   Blood pressure 124/82, pulse 64, height 5\' 6"  (1.676 m), weight 227 lb (103 kg), last menstrual period 10/22/2016, SpO2 97 %.Body mass index is 36.64 kg/m.  General Appearance: Fairly Groomed  Eye Contact:  Good  Speech:  Clear and Coherent  Volume:  Normal  Mood:  Depressed  Affect:  Appropriate, Congruent, Restricted and down  Thought Process:  Coherent  Orientation:  Full (Time, Place, and Person)  Thought Content: Logical   Suicidal Thoughts:  No  Homicidal Thoughts:  No  Memory:  Immediate;   Good  Judgement:  Good  Insight:  Fair  Psychomotor Activity:  Normal  Concentration:  Concentration: Good and Attention Span: Good  Recall:  Good  Fund of Knowledge: Good  Language: Good  Akathisia:  No  Handed:  Right  AIMS (if indicated): not done  Assets:  Communication Skills Desire for Improvement  ADL's:  Intact  Cognition: WNL  Sleep:  Good   Screenings: GAD-7     Counselor from 12/25/2017 in [facility]  Total GAD-7 Score  17    PHQ2-9     Counselor from 12/25/2017 in [facility]  PHQ-2 Total Score  6  PHQ-9 Total Score  24       Assessment and Plan:  Hannah [name] is a 57 y.o. year old female with a history of depression,  abnormal SPEP,OA,chronic anemia, hypertension, who presents for follow up appointment for MDD (major depressive disorder), recurrent episode, moderate ([place])  # MDD, moderate, recurrent without psychotic features There has been steady improvement in depressive symptoms after up titration of Wellbutrin.  Psychosocial stressors including work, loss of her mother, demoralization due to medical condition, and loss of her uncle.  She uptitrated Wellbutrin about a week ago; will continue current dose to see if it exerts  full effect to target depression.  She has no known history of seizure.  Discussed potential side effect of worsening anxiety and headache.  Will continue sertraline to target depression.  Discussed behavioral activation.   # Insomnia Will continue trazodone as needed for insomnia.  She is advised to limit its use if any worsening of snoring.  She is scheduled to see PCP for sleep study.  Although there has been improvement in depression, she is at high risk of relapsing depression under certain stress.  I would support that she remains working half days until the next appointment, which would allow her to take care of her depression.  I would also support that she uses noise canceling headphones at work given decreased concentration.   Plan I have reviewed and updated plans as below 1.Continue sertraline 50 mg daily(nausea at higher dose) 2.ContinueWellbutrin 300 mg daily  3. Continue Trazodone 50 mg at night as needed for sleep 4.Return to clinic in one month for24mins   The patient demonstrates the following risk factors for suicide: Chronic risk factors for suicide include:psychiatric disorder ofdepressionand medical illness of OA. Acute risk factorsfor suicide include: N/A. Protective factorsfor this patient include: positive social support, responsibility to others (children, family), coping skills and hope for the future. Considering these factors, the overall suicide risk at this point appears to below. Patientisappropriate for outpatient follow up.  Norman Clay, MD 05/21/2018, 10:10 AM

## 2018-05-21 ENCOUNTER — Ambulatory Visit (INDEPENDENT_AMBULATORY_CARE_PROVIDER_SITE_OTHER): Payer: PRIVATE HEALTH INSURANCE | Admitting: Psychiatry

## 2018-05-21 ENCOUNTER — Encounter (HOSPITAL_COMMUNITY): Payer: Self-pay | Admitting: Psychiatry

## 2018-05-21 VITALS — BP 124/82 | HR 64 | Ht 66.0 in | Wt 227.0 lb

## 2018-05-21 DIAGNOSIS — F331 Major depressive disorder, recurrent, moderate: Secondary | ICD-10-CM | POA: Diagnosis not present

## 2018-05-21 MED ORDER — TRAZODONE HCL 50 MG PO TABS: 50.0000 mg | ORAL_TABLET | Freq: Every day | ORAL | 0 refills | Status: DC

## 2018-05-21 MED ORDER — BUPROPION HCL ER (XL) 300 MG PO TB24: 300.0000 mg | ORAL_TABLET | Freq: Every day | ORAL | 0 refills | Status: DC

## 2018-05-21 NOTE — Patient Instructions (Addendum)
1.Continue sertraline 50 mg daily 2.ContinueWellbutrin 300 mg daily  3. Continue Trazodone 50 mg at night as needed for sleep 4.Return to clinic in one month for30mins

## 2018-05-29 ENCOUNTER — Ambulatory Visit (INDEPENDENT_AMBULATORY_CARE_PROVIDER_SITE_OTHER): Payer: PRIVATE HEALTH INSURANCE | Admitting: Licensed Clinical Social Worker

## 2018-05-29 ENCOUNTER — Encounter (HOSPITAL_COMMUNITY): Payer: Self-pay | Admitting: Licensed Clinical Social Worker

## 2018-05-29 DIAGNOSIS — F331 Major depressive disorder, recurrent, moderate: Secondary | ICD-10-CM

## 2018-05-29 NOTE — Progress Notes (Signed)
   THERAPIST PROGRESS NOTE  Session Time: 2-3  Participation Level: Active  Behavioral Response: CasualDrowsyEuthymic  Type of Therapy: Individual Therapy  Treatment Goals addressed: Diagnosis: Depression  Interventions: CBT  Summary: Hannah [name] is a 57 y.o. female who presents with MDD.  Pt presents as flat and tired in session. She makes intermittent eye contact. She reports a woman who she considered a "surrogate mother" died last weekend and pt is grieving. Pt admitted in the past she has tried to "keep busy" but now she is working to "allow herself to feel sad if needed". Counselor and pt discussed pt's plan for 2020 and her long-term plan for retirement from [employer]   Suicidal/Homicidal: Nowithout intent/plan  Therapist Response: Counselor used open questions, active listening, and supportive reflection. Counselor helped pt to process the ways she "allows others to push her buttons' and taught brief psychoeducation on "not allowing other's feelings to effect our own".  Plan: Return again in 2 weeks.  Diagnosis:    ICD-10-CM   1. MDD (major depressive disorder), recurrent episode, moderate ([place]) F33.1        Archie Balboa, LCAS-A 05/29/2018

## 2018-06-01 ENCOUNTER — Inpatient Hospital Stay: Payer: 59 | Attending: Nurse Practitioner

## 2018-06-01 DIAGNOSIS — C9000 Multiple myeloma not having achieved remission: Secondary | ICD-10-CM | POA: Diagnosis not present

## 2018-06-01 DIAGNOSIS — D472 Monoclonal gammopathy: Secondary | ICD-10-CM

## 2018-06-01 DIAGNOSIS — D509 Iron deficiency anemia, unspecified: Secondary | ICD-10-CM | POA: Diagnosis not present

## 2018-06-01 LAB — CMP (CANCER CENTER ONLY)
ALBUMIN: 3.4 g/dL — AB (ref 3.5–5.0)
ALK PHOS: 81 U/L (ref 38–126)
ALT: 14 U/L (ref 0–44)
AST: 15 U/L (ref 15–41)
Anion gap: 10 (ref 5–15)
BUN: 14 mg/dL (ref 6–20)
CALCIUM: 9.1 mg/dL (ref 8.9–10.3)
CO2: 29 mmol/L (ref 22–32)
Chloride: 103 mmol/L (ref 98–111)
Creatinine: 0.82 mg/dL (ref 0.44–1.00)
GFR, Est AFR Am: >60 mL/min (ref >60)
GFR, Estimated: >60 mL/min (ref >60)
GLUCOSE: 102 mg/dL — AB (ref 70–99)
Potassium: 3.6 mmol/L (ref 3.5–5.1)
SODIUM: 142 mmol/L (ref 135–145)
Total Bilirubin: 0.3 mg/dL (ref 0.3–1.2)
Total Protein: 7.8 g/dL (ref 6.5–8.1)

## 2018-06-01 LAB — CBC WITH DIFFERENTIAL (CANCER CENTER ONLY)
ABS IMMATURE GRANULOCYTES: 0.02 10*3/uL (ref 0.00–0.07)
BASOS ABS: 0 10*3/uL (ref 0.0–0.1)
Basophils Relative: 0 %
Eosinophils Absolute: 0.1 10*3/uL (ref 0.0–0.5)
Eosinophils Relative: 1 %
HCT: 35.7 % — ABNORMAL LOW (ref 36.0–46.0)
HEMOGLOBIN: 11.6 g/dL — AB (ref 12.0–15.0)
IMMATURE GRANULOCYTES: 0 %
LYMPHS ABS: 1.9 10*3/uL (ref 0.7–4.0)
LYMPHS PCT: 25 %
MCH: 23.9 pg — ABNORMAL LOW (ref 26.0–34.0)
MCHC: 32.5 g/dL (ref 30.0–36.0)
MCV: 73.6 fL — ABNORMAL LOW (ref 80.0–100.0)
Monocytes Absolute: 0.4 10*3/uL (ref 0.1–1.0)
Monocytes Relative: 5 %
NEUTROS ABS: 5.3 10*3/uL (ref 1.7–7.7)
NEUTROS PCT: 69 %
NRBC: 0 % (ref 0.0–0.2)
Platelet Count: 208 10*3/uL (ref 150–400)
RBC: 4.85 MIL/uL (ref 3.87–5.11)
RDW: 15.4 % (ref 11.5–15.5)
WBC Count: 7.7 10*3/uL (ref 4.0–10.5)

## 2018-06-01 LAB — FERRITIN: Ferritin: 83 ng/mL (ref 11–307)

## 2018-06-04 LAB — KAPPA/LAMBDA LIGHT CHAINS
Kappa free light chain: 18.7 mg/L (ref 3.3–19.4)
Kappa, lambda light chain ratio: 3.12 — ABNORMAL HIGH (ref 0.26–1.65)
Lambda free light chains: 6 mg/L (ref 5.7–26.3)

## 2018-06-04 LAB — MULTIPLE MYELOMA PANEL, SERUM
ALBUMIN SERPL ELPH-MCNC: 4 g/dL (ref 2.9–4.4)
ALPHA 1: 0.2 g/dL (ref 0.0–0.4)
Albumin/Glob SerPl: 1.2 (ref 0.7–1.7)
Alpha2 Glob SerPl Elph-Mcnc: 0.6 g/dL (ref 0.4–1.0)
B-GLOBULIN SERPL ELPH-MCNC: 2 g/dL — AB (ref 0.7–1.3)
GLOBULIN, TOTAL: 3.4 g/dL (ref 2.2–3.9)
Gamma Glob SerPl Elph-Mcnc: 0.7 g/dL (ref 0.4–1.8)
IgA: 1839 mg/dL — ABNORMAL HIGH (ref 87–352)
IgG (Immunoglobin G), Serum: 839 mg/dL (ref 700–1600)
IgM (Immunoglobulin M), Srm: 29 mg/dL (ref 26–217)
M Protein SerPl Elph-Mcnc: 1.3 g/dL — ABNORMAL HIGH
TOTAL PROTEIN ELP: 7.4 g/dL (ref 6.0–8.5)

## 2018-06-08 ENCOUNTER — Telehealth (HOSPITAL_COMMUNITY): Payer: Self-pay | Admitting: Psychiatry

## 2018-06-08 ENCOUNTER — Telehealth (INDEPENDENT_AMBULATORY_CARE_PROVIDER_SITE_OTHER): Payer: Self-pay | Admitting: Orthopaedic Surgery

## 2018-06-08 NOTE — Telephone Encounter (Signed)
Returned call to patient left message to call back concerning her being referred to another doctor for surgery.

## 2018-06-12 ENCOUNTER — Ambulatory Visit (HOSPITAL_COMMUNITY): Payer: PRIVATE HEALTH INSURANCE | Admitting: Licensed Clinical Social Worker

## 2018-06-12 ENCOUNTER — Telehealth: Payer: Self-pay

## 2018-06-12 NOTE — Telephone Encounter (Signed)
Left patient voice message regarding lab results, per Dr. Burr Medico M-protein is stable, no concerns.

## 2018-06-12 NOTE — Telephone Encounter (Signed)
-----   Message from Truitt Merle, MD sent at 06/10/2018  8:23 PM EST ----- Please let pt know her recent lab showedstable M-protein, her MGUS is stable, no concerns, thanks   Truitt Merle  06/10/2018

## 2018-06-15 ENCOUNTER — Encounter (HOSPITAL_COMMUNITY): Payer: Self-pay | Admitting: Licensed Clinical Social Worker

## 2018-06-15 ENCOUNTER — Ambulatory Visit (INDEPENDENT_AMBULATORY_CARE_PROVIDER_SITE_OTHER): Payer: 59 | Admitting: Licensed Clinical Social Worker

## 2018-06-15 DIAGNOSIS — F331 Major depressive disorder, recurrent, moderate: Secondary | ICD-10-CM

## 2018-06-15 NOTE — Progress Notes (Signed)
   THERAPIST PROGRESS NOTE  Session Time: 8-9  Participation Level: Active  Behavioral Response: Well GroomedAlertAnxious  Type of Therapy: Individual Therapy  Treatment Goals addressed: Anxiety  Interventions: CBT and Supportive  Summary: Hannah [name] is a 58 y.o. female who presents with hx of MDD and anxiety.  She is well groomed, leans forward, and appears agitated in session. She states agitation began on her way to this appointment. Pt discusses an upcoming meeting she is anxious about which will take place today at noon and will involve a coworker she is having difficulty with.  Pt processes her feelings and identifies her goals for the meeting.   Suicidal/Homicidal: Nowithout intent/plan  Therapist Response: Counselor used open questions, active listening, emotion-focusing, breathing techniques, and reflection.   Plan: Return again in 2 weeks.  Diagnosis:    ICD-10-CM   1. MDD (major depressive disorder), recurrent episode, moderate ([place]) F33.1       Archie Balboa, LCAS-A 06/15/2018

## 2018-06-18 ENCOUNTER — Telehealth (HOSPITAL_COMMUNITY): Payer: Self-pay | Admitting: Psychiatry

## 2018-06-18 NOTE — Telephone Encounter (Signed)
Please inform that patient that I plan to finalize the accomodation form after I see her on 1/23 for better assessment.

## 2018-06-18 NOTE — Telephone Encounter (Signed)
Per Recording the mailbox is full & cannot accept new messages

## 2018-06-19 NOTE — Progress Notes (Addendum)
[facility] MD/PA/NP OP Progress Note  06/21/2018 8:44 AM Hannah [name]  MRN:  [id number]  Chief Complaint:  Chief Complaint    Depression; Follow-up     HPI:  Patient presents for follow-up appointment for depression.  She states that she has been feeling more depressed.  She notices that she is forgetful.  Although she goes to work every day, she has significant fatigue especially later in the day, and it is difficult for the patient to function well.  She has appetite loss, although she will make herself eat.  She has insomnia.  She has passive SI.  She feels anxious and tense.  She denies panic attacks.  She wants to try PHP, which she could not complete as she had absence due to loss of her uncle when she attended the last time. She does not want to be "highly medicated," and declined to make any medication change at this time.  Wt Readings from Last 3 Encounters:  06/21/18 229 lb (103.9 kg)  05/21/18 227 lb (103 kg)  04/19/18 232 lb (105.2 kg)    Visit Diagnosis:    ICD-10-CM   1. Moderate episode of recurrent major depressive disorder ([place]) F33.1     Past Psychiatric History: Please see initial evaluation for full details. I have reviewed the history. No updates at this time.     Past Medical History:  Past Medical History:  Diagnosis Date  . Allergy   . Anemia   . Anxiety   . Arthritis   . Asthma   . Depression   . Hypertension     Past Surgical History:  Procedure Laterality Date  . CHOLECYSTECTOMY  1993  . COLONOSCOPY    . 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 TOTAL KNEE ARTHROPLASTY Right 01/28/2016   Procedure: RIGHT TOTAL KNEE ARTHROPLASTY;  Surgeon: [name], MD;  Location: [facility];  Service: Orthopedics;  Laterality: Right;  . TRIGGER FINGER RELEASE Right 06/18/2014   Procedure: RIGHT LONG FINGER TRIGGER RELEASE;  Surgeon: [name], MD;  Location: [facility];  Service: Orthopedics;  Laterality: Right;  . TUBAL LIGATION    . VAGINAL  HYSTERECTOMY Bilateral 11/04/2016   Procedure: HYSTERECTOMY VAGINAL with Bilateral Salpingectomy;  Surgeon: [name], MD;  Location: [facility];  Service: Gynecology;  Laterality: Bilateral;    Family Psychiatric History: Please see initial evaluation for full details. I have reviewed the history. No updates at this time.     Family History:  Family History  Problem Relation Age of Onset  . Cancer Mother        uterine   . Asthma Mother   . Arthritis Daughter   . Depression Maternal Uncle   . Colon cancer Neg Hx     Social History:  Social History   Socioeconomic History  . Marital status: Married    Spouse name: Not on file  . Number of children: Not on file  . Years of education: Not on file  . Highest education level: Not on file  Occupational History  . Not on file  Social Needs  . Financial resource strain: Not hard at all  . Food insecurity:    Worry: Never true    Inability: Never true  . Transportation needs:    Medical: No    Non-medical: No  Tobacco Use  . Smoking status: Never Smoker  . Smokeless tobacco: Never Used  Substance and Sexual Activity  . Alcohol use: No  Alcohol/week: 0.0 standard drinks  . Drug use: No  . Sexual activity: Yes    Partners: Male    Birth control/protection: Surgical  Lifestyle  . Physical activity:    Days per week: 0 days    Minutes per session: 0 min  . Stress: Very much  Relationships  . Social connections:    Talks on phone: More than three times a week    Gets together: Once a week    Attends religious service: More than 4 times per year    Active member of club or organization: Yes    Attends meetings of clubs or organizations: More than 4 times per year    Relationship status: Married  Other Topics Concern  . Not on file  Social History Narrative  . Not on file    Allergies:  Allergies  Allergen Reactions  . Sulfur Swelling    Facial swelling     Metabolic Disorder Labs: No results found  for: HGBA1C, MPG No results found for: PROLACTIN No results found for: CHOL, TRIG, HDL, CHOLHDL, VLDL, LDLCALC Lab Results  Component Value Date   TSH 1.14 09/05/2017    Therapeutic Level Labs: No results found for: LITHIUM No results found for: VALPROATE No components found for:  CBMZ  Current Medications: Current Outpatient Medications  Medication Sig Dispense Refill  . budesonide-formoterol (SYMBICORT) 160-4.5 MCG/ACT inhaler Inhale 2 puffs into the lungs daily as needed.    Marland Kitchen buPROPion (WELLBUTRIN XL) 300 MG 24 hr tablet Take 1 tablet (300 mg total) by mouth daily. 90 tablet 0  . chlorthalidone (HYGROTON) 25 MG tablet TAKE 1 TABLET BY MOUTH EVERY DAY FOR HTN  3  . ferrous sulfate 325 (65 FE) MG tablet Take 325 mg by mouth 2 (two) times daily with a meal.    . ibuprofen (ADVIL,MOTRIN) 600 MG tablet 600 MG BY MOUTH EVERY 6 HOURS FOR 5 DAYS, THEN EVERY 6 HOURS AS NEEDED FOR PAIN.  DON'T TAKE MOBIC WHILE TAKING IBUPROFEN 60 tablet 0  . KLOR-CON M20 20 MEQ tablet TAKE 1 TABLET (20 MEQ TOTAL) BY MOUTH DAILY. TAKE 1 TAB TWICE DAILY FOR 3 DAYS THEN TAKE 1 TAB DAILY 33 tablet 0  . Multiple Vitamin (MULTIVITAMIN) tablet Take 1 tablet by mouth daily.    . naproxen sodium (ALEVE) 220 MG tablet Take 220 mg by mouth as needed.    . Potassium (POTASSIMIN PO) Take by mouth.    . sertraline (ZOLOFT) 50 MG tablet Take 1 tablet (50 mg total) by mouth at bedtime. 90 tablet 0  . traZODone (DESYREL) 50 MG tablet Take 1 tablet (50 mg total) by mouth at bedtime. 90 tablet 0  . Vitamin D, Ergocalciferol, (DRISDOL) 50000 units CAPS capsule Take 50,000 Units by mouth every Monday.      No current facility-administered medications for this visit.      Musculoskeletal: Strength & Muscle Tone: within normal limits Gait & Station: normal Patient leans: N/A  Psychiatric Specialty Exam: Review of Systems  Psychiatric/Behavioral: Positive for depression, memory loss and suicidal ideas. Negative for  hallucinations and substance abuse. The patient is nervous/anxious and has insomnia.   All other systems reviewed and are negative.   Blood pressure 119/79, pulse 91, height 5\' 6"  (1.676 m), weight 229 lb (103.9 kg), last menstrual period 10/22/2016, SpO2 93 %.Body mass index is 36.96 kg/m.  General Appearance: Fairly Groomed  Eye Contact:  Good  Speech:  Clear and Coherent  Volume:  Normal  Mood:  Depressed  Affect:  Appropriate, Congruent and Restricted  Thought Process:  Coherent  Orientation:  Full (Time, Place, and Person)  Thought Content: Logical   Suicidal Thoughts:  Yes.  without intent/plan  Homicidal Thoughts:  No  Memory:  Immediate;   Good  Judgement:  Good  Insight:  Fair  Psychomotor Activity:  Normal  Concentration:  Concentration: Fair and Attention Span: Fair  Recall:  Good  Fund of Knowledge: Good  Language: Good  Akathisia:  No  Handed:  Right  AIMS (if indicated): N/A  Assets:  Communication Skills Desire for Improvement  ADL's:  Intact  Cognition: WNL  Sleep:  Poor   Screenings: GAD-7     Counselor from 12/25/2017 in [facility]  Total GAD-7 Score  17    PHQ2-9     Counselor from 12/25/2017 in [facility]  PHQ-2 Total Score  6  PHQ-9 Total Score  24       Assessment and Plan:  ADALENE GULOTTA is a 58 y.o. year old female with a history of depression,  abnormal SPEP,OA,chronic anemia, hypertension, who presents for follow up appointment for Moderate episode of recurrent major depressive disorder ([place])  # MDD, moderate, recurrent without psychotic features Patient reports slight worsening in depressive symptoms since the last visit.  Psychosocial stressors including work, loss of her mother, demoralization due to medical condition, and loss of her uncle.  Although it is strongly recommended to uptitrate bupropion, she reports strong preference to stay on current dose.   Will continue current dose of bupropion to target depression.  She has no known history of seizure.  Discussed potential side effect of worsening anxiety and headache.  Will continue sertraline to target depression.  Discussed behavioral activation.  She is interested in trying PHP again; will make referral.   She has relapsing depression, which appears to be exacerbated under stress at work.  I would support that she has intermittent absences 30 hours/month given patient is at high risk of worsening in depression, which interferes with her ability to complete tasks or go to work.   Plan I have reviewed and updated plans as below 1.Continue sertraline 50 mg daily(nausea at higher dose) 2.ContinueWellbutrin300 mg daily  3. Continue Trazodone 50 mg at night as needed for sleep 4.Return to clinic in one month for68mins  Fill out paperwork for job accomodation: 30 hours per month of intermittent absences. - Referral to [facility]  The patient demonstrates the following risk factors for suicide: Chronic risk factors for suicide include:psychiatric disorder ofdepressionand medical illness of OA. Acute risk factorsfor suicide include: N/A. Protective factorsfor this patient include: positive social support, responsibility to others (children, family), coping skills and hope for the future. Considering these factors, the overall suicide risk at this point appears to below. Patientisappropriate for outpatient follow up.  Norman Clay, MD 06/21/2018, 8:44 AM

## 2018-06-21 ENCOUNTER — Ambulatory Visit (INDEPENDENT_AMBULATORY_CARE_PROVIDER_SITE_OTHER): Payer: 59 | Admitting: Psychiatry

## 2018-06-21 ENCOUNTER — Encounter (HOSPITAL_COMMUNITY): Payer: Self-pay | Admitting: Psychiatry

## 2018-06-21 VITALS — BP 119/79 | HR 91 | Ht 66.0 in | Wt 229.0 lb

## 2018-06-21 DIAGNOSIS — F331 Major depressive disorder, recurrent, moderate: Secondary | ICD-10-CM | POA: Diagnosis not present

## 2018-06-21 NOTE — Patient Instructions (Signed)
1.Continue sertraline 50 mg daily 2.ContinueWellbutrin300 mg daily  3. Continue Trazodone 50 mg at night as needed for sleep 4.Return to clinic in one month for79mins

## 2018-06-25 ENCOUNTER — Telehealth (HOSPITAL_COMMUNITY): Payer: Self-pay | Admitting: Professional

## 2018-06-26 ENCOUNTER — Ambulatory Visit (INDEPENDENT_AMBULATORY_CARE_PROVIDER_SITE_OTHER): Payer: 59 | Admitting: Licensed Clinical Social Worker

## 2018-06-26 ENCOUNTER — Telehealth (HOSPITAL_COMMUNITY): Payer: Self-pay | Admitting: Professional

## 2018-06-26 DIAGNOSIS — F331 Major depressive disorder, recurrent, moderate: Secondary | ICD-10-CM

## 2018-06-26 NOTE — Telephone Encounter (Signed)
Cln returned pt call. Pt made pt aware that provider, Ricky Ala, declined pt and recommended pt try another program since pt has done PHP and IOP within this agency. Cln told pt about OV in [place] and pt reports she would not drive to [place] "because that would be too much." Cln told pt that would be her option and the provider has the final say about group. Cln told pt she could discuss other options with Youlanda Roys, individual counselor, at appt today. Pt hung up on cln before could complete conversation.

## 2018-06-27 ENCOUNTER — Encounter (HOSPITAL_COMMUNITY): Payer: Self-pay | Admitting: Licensed Clinical Social Worker

## 2018-06-27 NOTE — Progress Notes (Signed)
   THERAPIST PROGRESS NOTE  Session Time: 2:30-3:30PM  Participation Level: Active  Behavioral Response: NeatAlertEuthymic  Type of Therapy: Individual Therapy  Treatment Goals addressed: Coping  Interventions: CBT  Summary: Hannah [name] is a 58 y.o. female who presents with MDD.  She states she is "still not happy w/ the days that she wants to sleep all day."  PT reports she is enjoying her job more and noticing that her irritability is decreasing and is able to brush off minor annoyances. PT reports she recently started taking a new prescribed medication that is helping her sleep, though she cannot name the medication. PT wants to discuss possiblity of starting an intensive group therapy treatment again in this office. PT is discouraged from this and recommended to try [facility] if she is interested in "going deeper" or working on unresolved issues. PT wonders aloud if she is ADHD and reports having lack of focus, inability to stay on task, and easily distracted sxs. Counselor recommends PT seek testing from Rachel Moulds at [facility] to rule out adult ADHD.   Suicidal/Homicidal: Nowithout intent/plan  Therapist Response: Counselor used supportive, open questions, feedback, challenging cognitions, and reflection. Counselor made recommendations about increasing therapy interventions w/o joining another group since she has completed 2 groups here last year. PT continues to show improvement though she is bothered by her "lack of being perfect".   Plan: Return again in 2 weeks.  Diagnosis:    ICD-10-CM   1. MDD (major depressive disorder), recurrent episode, moderate ([place]) F33.1       Archie Balboa, LCAS-A 06/27/2018

## 2018-07-09 ENCOUNTER — Ambulatory Visit (INDEPENDENT_AMBULATORY_CARE_PROVIDER_SITE_OTHER): Payer: 59 | Admitting: Licensed Clinical Social Worker

## 2018-07-09 ENCOUNTER — Encounter (HOSPITAL_COMMUNITY): Payer: Self-pay | Admitting: Licensed Clinical Social Worker

## 2018-07-09 DIAGNOSIS — F331 Major depressive disorder, recurrent, moderate: Secondary | ICD-10-CM

## 2018-07-09 NOTE — Progress Notes (Signed)
   THERAPIST PROGRESS NOTE  Session Time: 8-9  Participation Level: Active  Behavioral Response: Well GroomedAlertEuthymic  Type of Therapy: Individual Therapy  Treatment Goals addressed: Communication: Self-Talk, and interpersonal effectiveness  Interventions: CBT and Psychosocial Skills: Communication  Summary: PRINCES FINGER is a 58 y.o. female who presents with hx of MDD and Anxiety. She is pleasant and talkative in session. She reports on her difficult weekend and finding out that her 65yo daughter's is having relationship difficulties. PT and counselor discuss PT desire for "plans of action" and how to improve her self talk.   Suicidal/Homicidal: Nowithout intent/plan  Therapist Response: Counselor used open questions active listening and encouragement. Counselor reframed PT's criticism of her daughter as feedback for herself. PT continues to report she needs to be gentler w/ herself and consider how her depression can slow her down.   Plan: Return again in 2 weeks.  Diagnosis:    ICD-10-CM   1. MDD (major depressive disorder), recurrent episode, moderate ([place]) F33.1         Archie Balboa, LCAS-A 07/09/2018

## 2018-07-16 ENCOUNTER — Ambulatory Visit (HOSPITAL_COMMUNITY): Payer: PRIVATE HEALTH INSURANCE | Admitting: Licensed Clinical Social Worker

## 2018-07-23 ENCOUNTER — Ambulatory Visit (INDEPENDENT_AMBULATORY_CARE_PROVIDER_SITE_OTHER): Payer: 59 | Admitting: Licensed Clinical Social Worker

## 2018-07-23 DIAGNOSIS — F331 Major depressive disorder, recurrent, moderate: Secondary | ICD-10-CM

## 2018-07-23 NOTE — Progress Notes (Signed)
[facility] MD/PA/NP OP Progress Note  07/26/2018 3:35 PM IRIDIAN READER  MRN:  [id number]  Chief Complaint:  Chief Complaint    Follow-up; Depression     HPI:  Patient presents late for follow-up appointment for depression.  She states that "Work is work." She was not approved of intermittent absence from work. She has had difficulty in concentration and has significant fatigue.  She has significant anhedonia. She does not do anything after returning from work.  She has improved sleep after starting trazodone.  She has fair appetite.  She denies SI.  She feels anxious and tense; it worsened especially when there are loud noises at work. She had at least a few panic attacks.  Wt Readings from Last 3 Encounters:  07/26/18 233 lb (105.7 kg)  06/21/18 229 lb (103.9 kg)  05/21/18 227 lb (103 kg)    Visit Diagnosis:    ICD-10-CM   1. Moderate episode of recurrent major depressive disorder ([place]) F33.1     Past Psychiatric History: Please see initial evaluation for full details. I have reviewed the history. No updates at this time.     Past Medical History:  Past Medical History:  Diagnosis Date  . Allergy   . Anemia   . Anxiety   . Arthritis   . Asthma   . Depression   . Hypertension     Past Surgical History:  Procedure Laterality Date  . CHOLECYSTECTOMY  1993  . COLONOSCOPY    . 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 TRIGGER FINGER RELEASE Right 06/18/2014   Procedure: RIGHT LONG FINGER TRIGGER RELEASE;  Surgeon: [name], MD;  Location: [facility];  Service: Orthopedics;  Laterality: Right;  . TUBAL LIGATION    . VAGINAL HYSTERECTOMY Bilateral 11/04/2016   Procedure: HYSTERECTOMY VAGINAL with Bilateral Salpingectomy;  Surgeon: [name], MD;  Location: [facility];  Service:  Gynecology;  Laterality: Bilateral;    Family Psychiatric History: Please see initial evaluation for full details. I have reviewed the history. No updates at this time.     Family History:  Family History  Problem Relation Age of Onset  . Cancer Mother        uterine   . Asthma Mother   . Arthritis Daughter   . Depression Maternal Uncle   . Colon cancer Neg Hx     Social History:  Social History   Socioeconomic History  . Marital status: Married    Spouse name: Not on file  . Number of children: Not on file  . Years of education: Not on file  . Highest education level: Not on file  Occupational History  . Not on file  Social Needs  . Financial resource strain: Not hard at all  . Food insecurity:    Worry: Never true    Inability: Never true  . Transportation needs:    Medical: No    Non-medical: No  Tobacco Use  . Smoking status: Never Smoker  . Smokeless tobacco: Never Used  Substance and Sexual Activity  . Alcohol use: No    Alcohol/week: 0.0 standard drinks  . Drug use: No  . Sexual activity: Yes    Partners: Male    Birth control/protection: Surgical  Lifestyle  . Physical activity:    Days per week: 0  days    Minutes per session: 0 min  . Stress: Very much  Relationships  . Social connections:    Talks on phone: More than three times a week    Gets together: Once a week    Attends religious service: More than 4 times per year    Active member of club or organization: Yes    Attends meetings of clubs or organizations: More than 4 times per year    Relationship status: Married  Other Topics Concern  . Not on file  Social History Narrative  . Not on file    Allergies:  Allergies  Allergen Reactions  . Sulfur Swelling    Facial swelling     Metabolic Disorder Labs: No results found for: HGBA1C, MPG No results found for: PROLACTIN No results found for: CHOL, TRIG, HDL, CHOLHDL, VLDL, LDLCALC Lab Results  Component Value Date   TSH 1.14  09/05/2017    Therapeutic Level Labs: No results found for: LITHIUM No results found for: VALPROATE No components found for:  CBMZ  Current Medications: Current Outpatient Medications  Medication Sig Dispense Refill  . budesonide-formoterol (SYMBICORT) 160-4.5 MCG/ACT inhaler Inhale 2 puffs into the lungs daily as needed.    Marland Kitchen buPROPion (WELLBUTRIN XL) 300 MG 24 hr tablet 450 mg daily (150 mg + 300 mg) 90 tablet 0  . chlorthalidone (HYGROTON) 25 MG tablet TAKE 1 TABLET BY MOUTH EVERY DAY FOR HTN  3  . ferrous sulfate 325 (65 FE) MG tablet Take 325 mg by mouth 2 (two) times daily with a meal.    . ibuprofen (ADVIL,MOTRIN) 600 MG tablet 600 MG BY MOUTH EVERY 6 HOURS FOR 5 DAYS, THEN EVERY 6 HOURS AS NEEDED FOR PAIN.  DON'T TAKE MOBIC WHILE TAKING IBUPROFEN 60 tablet 0  . KLOR-CON M20 20 MEQ tablet TAKE 1 TABLET (20 MEQ TOTAL) BY MOUTH DAILY. TAKE 1 TAB TWICE DAILY FOR 3 DAYS THEN TAKE 1 TAB DAILY 33 tablet 0  . Multiple Vitamin (MULTIVITAMIN) tablet Take 1 tablet by mouth daily.    . naproxen sodium (ALEVE) 220 MG tablet Take 220 mg by mouth as needed.    . Potassium (POTASSIMIN PO) Take by mouth.    Derrill Memo ON 08/13/2018] sertraline (ZOLOFT) 50 MG tablet Take 1 tablet (50 mg total) by mouth at bedtime. 90 tablet 0  . [START ON 08/15/2018] traZODone (DESYREL) 50 MG tablet Take 1 tablet (50 mg total) by mouth at bedtime. 90 tablet 0  . Vitamin D, Ergocalciferol, (DRISDOL) 50000 units CAPS capsule Take 50,000 Units by mouth every Monday.     Marland Kitchen buPROPion (WELLBUTRIN XL) 150 MG 24 hr tablet 450 mg daily (150 mg + 300 mg) 30 tablet 0   No current facility-administered medications for this visit.      Musculoskeletal: Strength & Muscle Tone: within normal limits Gait & Station: normal Patient leans: N/A  Psychiatric Specialty Exam: Review of Systems  Psychiatric/Behavioral: Positive for depression. Negative for hallucinations, memory loss, substance abuse and suicidal ideas. The patient is  nervous/anxious. The patient does not have insomnia.   All other systems reviewed and are negative.   Blood pressure 128/82, pulse 72, height 5\' 6"  (1.676 m), weight 233 lb (105.7 kg), last menstrual period 10/22/2016, SpO2 97 %.Body mass index is 37.61 kg/m.  General Appearance: Fairly Groomed  Eye Contact:  Good  Speech:  Clear and Coherent  Volume:  Normal  Mood:  Depressed  Affect:  Appropriate, Congruent and Restricted  Thought  Process:  Coherent  Orientation:  Full (Time, Place, and Person)  Thought Content: Logical   Suicidal Thoughts:  No  Homicidal Thoughts:  No  Memory:  Immediate;   Good  Judgement:  Good  Insight:  Fair  Psychomotor Activity:  Normal  Concentration:  Concentration: Fair and Attention Span: Fair  Recall:  Good  Fund of Knowledge: Good  Language: Good  Akathisia:  No  Handed:  Right  AIMS (if indicated): not done  Assets:  Communication Skills Desire for Improvement  ADL's:  Intact  Cognition: WNL  Sleep:  Fair   Screenings: GAD-7     Counselor from 12/25/2017 in [place]  Total GAD-7 Score  17    PHQ2-9     Counselor from 12/25/2017 in [place]  PHQ-2 Total Score  6  PHQ-9 Total Score  24       Assessment and Plan:  GILMA BESSETTE is a 58 y.o. year old female with a history of depression, abnormal SPEP,OA,chronic anemia, hypertension, who presents for follow up appointment for Moderate episode of recurrent major depressive disorder ([place])  # MDD, moderate, recurrent without psychotic features Exam is notable for restricted affect, fatigue, and she continues to have depressive symptoms since the last visit.  Psychosocial stressors including work, loss of her mother, demoralization due to medical condition and loss of her uncle.  Will uptitrate bupropion as adjunctive treatment for depression.  Discussed potential side effect of worsening anxiety and headache.   She has no known history of seizure.  Will continue sertraline to target depression.  Discussed behavioral activation.   No change in the following recommendation She has relapsing depression, which appears to be exacerbated under stress at work.  I would support that she has intermittent absences 30 hours/month given patient is at high risk of worsening in depression, which interferes with her ability to complete tasks or go to work.   Plan I have reviewed and updated plans as below 1.Continue sertraline 50 mg daily(nausea at higher dose) 2.Increase bupropion 450 mg daily (300 mg + 150 mg) 3. Continue Trazodone 50 mg at night as needed for sleep 4.Return to clinic in one month for82mins   The patient demonstrates the following risk factors for suicide: Chronic risk factors for suicide include:psychiatric disorder ofdepressionand medical illness of OA. Acute risk factorsfor suicide include: N/A. Protective factorsfor this patient include: positive social support, responsibility to others (children, family), coping skills and hope for the future. Considering these factors, the overall suicide risk at this point appears to below. Patientisappropriate for outpatient follow up.  Norman Clay, MD 07/26/2018, 3:35 PM

## 2018-07-24 NOTE — Progress Notes (Signed)
PT no showed.

## 2018-07-26 ENCOUNTER — Encounter (HOSPITAL_COMMUNITY): Payer: Self-pay | Admitting: Psychiatry

## 2018-07-26 ENCOUNTER — Ambulatory Visit (INDEPENDENT_AMBULATORY_CARE_PROVIDER_SITE_OTHER): Payer: PRIVATE HEALTH INSURANCE | Admitting: Psychiatry

## 2018-07-26 VITALS — BP 128/82 | HR 72 | Ht 66.0 in | Wt 233.0 lb

## 2018-07-26 DIAGNOSIS — F331 Major depressive disorder, recurrent, moderate: Secondary | ICD-10-CM | POA: Diagnosis not present

## 2018-07-26 MED ORDER — BUPROPION HCL ER (XL) 300 MG PO TB24: ORAL_TABLET | ORAL | 0 refills | Status: DC

## 2018-07-26 MED ORDER — SERTRALINE HCL 50 MG PO TABS: 50.0000 mg | ORAL_TABLET | Freq: Every day | ORAL | 0 refills | Status: DC

## 2018-07-26 MED ORDER — BUPROPION HCL ER (XL) 150 MG PO TB24: ORAL_TABLET | ORAL | 0 refills | Status: DC

## 2018-07-26 MED ORDER — TRAZODONE HCL 50 MG PO TABS: 50.0000 mg | ORAL_TABLET | Freq: Every day | ORAL | 0 refills | Status: DC

## 2018-07-26 NOTE — Patient Instructions (Addendum)
1.Continue sertraline 50 mg daily 2.Increase bupropion 450 mg daily (300 mg + 150 mg) 3. Continue Trazodone 50 mg at night as needed for sleep 4.Return to clinic in one month for12mins

## 2018-07-27 ENCOUNTER — Telehealth (HOSPITAL_COMMUNITY): Payer: Self-pay | Admitting: Psychiatry

## 2018-07-27 NOTE — Telephone Encounter (Signed)
Left voice message. Will plan to contact on Monday as well.

## 2018-07-30 ENCOUNTER — Telehealth (HOSPITAL_COMMUNITY): Payer: Self-pay | Admitting: Psychiatry

## 2018-07-30 NOTE — Telephone Encounter (Signed)
Discussed with the patient. Apologized about the incident, and updated about her job accomodation paperwork. She verbalized the understanding. She would like the copy of paperwork to be mailed to her. Will plan to mail the form.

## 2018-07-30 NOTE — Telephone Encounter (Signed)
Called [employer] disability service center  [phone]  They have not received any paperwork from this clinic since last Dec. Explained to the staff that we did fax the form, which was signed on 06/21/2018 [phone]). The staff on the phone advised to send the form to the number below. Completed another form with additional information from the recent encounter. Will plan to fax the form   Fax: [phone]

## 2018-08-06 ENCOUNTER — Ambulatory Visit (INDEPENDENT_AMBULATORY_CARE_PROVIDER_SITE_OTHER): Payer: 59 | Admitting: Licensed Clinical Social Worker

## 2018-08-06 DIAGNOSIS — F331 Major depressive disorder, recurrent, moderate: Secondary | ICD-10-CM

## 2018-08-11 ENCOUNTER — Encounter (HOSPITAL_COMMUNITY): Payer: Self-pay | Admitting: Licensed Clinical Social Worker

## 2018-08-11 NOTE — Progress Notes (Signed)
   THERAPIST PROGRESS NOTE  Session Time: 8-9am  Participation Level: Minimal  Behavioral Response: Well GroomedDrowsyEuthymic  Type of Therapy: Individual Therapy  Treatment Goals addressed: Coping  Interventions: CBT  Summary: Hannah [name] is a 58 y.o. female who presents with hx of anxiety and depression for an individual therapy session. She is tired, yawns frequently throughout session, and states she is sleepy. She reports on her recent trip to [facility] w/ family friends. PT denies any struggles w/ depression since last session. She wants to step down to meeting monthly.  Suicidal/Homicidal: Nowithout intent/plan  Therapist Response: Counselor used open questions, active listening, and reflection of coping skills.  Plan: Return again in 4 weeks.  Diagnosis:    ICD-10-CM   1. Moderate episode of recurrent major depressive disorder [facility]) F33.1       Archie Balboa, LCAS-A 08/11/2018

## 2018-08-20 ENCOUNTER — Ambulatory Visit (HOSPITAL_COMMUNITY): Payer: 59 | Admitting: Licensed Clinical Social Worker

## 2018-08-20 NOTE — Progress Notes (Signed)
Virtual Visit via Telephone Note  I connected with Hannah [name] on 08/23/18 at  4:30 PM EDT by telephone and verified that I am speaking with the correct person using two identifiers.   I discussed the limitations, risks, security and privacy concerns of performing an evaluation and management service by telephone and the availability of in person appointments. I also discussed with the patient that there may be a patient responsible charge related to this service. The patient expressed understanding and agreed to proceed.    I discussed the assessment and treatment plan with the patient. The patient was provided an opportunity to ask questions and all were answered. The patient agreed with the plan and demonstrated an understanding of the instructions.   The patient was advised to call back or seek an in-person evaluation if the symptoms worsen or if the condition fails to improve as anticipated.  I provided 15 minutes of non-face-to-face time during this encounter.   Norman Clay, MD   [facility] MD/PA/NP OP Progress Note  08/23/2018 4:58 PM Hannah [name]  MRN:  [id number]  Chief Complaint:  Chief Complaint    Follow-up; Depression     HPI:  Patient states that she has been feeling better since the last visit. She has not increased bupropion as she misunderstood that the new order to be higher dose and she wanted to complete the medication she has.  She has been out of work for the past week as her son is at home.  She will likely have a choice to stay out of work for another few weeks if that is needed because of her son.  She believes that she has been able to handle situation better. She notices more about her trigger. She tries to remove herself from environment when she feels anxious.  She notices that it is easily triggered by customers who are "excitable." She takes a walk regularly and hopes to do it more.   She reports better sleep.  She feels less depressed.  She has more  motivation and energy.  She has difficulty concentration.  She denies SI.  She feels less anxious.  She had occasional panic attacks.    Visit Diagnosis:    ICD-10-CM   1. Moderate episode of recurrent major depressive disorder ([place]) F33.1     Past Psychiatric History: Please see initial evaluation for full details. I have reviewed the history. No updates at this time.     Past Medical History:  Past Medical History:  Diagnosis Date  . Allergy   . Anemia   . Anxiety   . Arthritis   . Asthma   . Depression   . Hypertension     Past Surgical History:  Procedure Laterality Date  . CHOLECYSTECTOMY  1993  . COLONOSCOPY    . 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 TRIGGER FINGER RELEASE Right 06/18/2014   Procedure: RIGHT LONG FINGER TRIGGER RELEASE;  Surgeon: [name], MD;  Location: [facility];  Service: Orthopedics;  Laterality: Right;  . TUBAL LIGATION    . VAGINAL HYSTERECTOMY Bilateral 11/04/2016   Procedure: HYSTERECTOMY VAGINAL with Bilateral Salpingectomy;  Surgeon: [name], MD;  Location: [facility];  Service: Gynecology;  Laterality: Bilateral;    Family Psychiatric History: Please see initial evaluation for full details.  I have reviewed the history. No updates at this time.     Family History:  Family History  Problem Relation Age of Onset  . Cancer Mother        uterine   . Asthma Mother   . Arthritis Daughter   . Depression Maternal Uncle   . Colon cancer Neg Hx     Social History:  Social History   Socioeconomic History  . Marital status: Married    Spouse name: Not on file  . Number of children: Not on file  . Years of education: Not on file  . Highest education level: Not on file  Occupational History  . Not on file  Social Needs  .  Financial resource strain: Not hard at all  . Food insecurity:    Worry: Never true    Inability: Never true  . Transportation needs:    Medical: No    Non-medical: No  Tobacco Use  . Smoking status: Never Smoker  . Smokeless tobacco: Never Used  Substance and Sexual Activity  . Alcohol use: No    Alcohol/week: 0.0 standard drinks  . Drug use: No  . Sexual activity: Yes    Partners: Male    Birth control/protection: Surgical  Lifestyle  . Physical activity:    Days per week: 0 days    Minutes per session: 0 min  . Stress: Very much  Relationships  . Social connections:    Talks on phone: More than three times a week    Gets together: Once a week    Attends religious service: More than 4 times per year    Active member of club or organization: Yes    Attends meetings of clubs or organizations: More than 4 times per year    Relationship status: Married  Other Topics Concern  . Not on file  Social History Narrative  . Not on file    Allergies:  Allergies  Allergen Reactions  . Sulfur Swelling    Facial swelling     Metabolic Disorder Labs: No results found for: HGBA1C, MPG No results found for: PROLACTIN No results found for: CHOL, TRIG, HDL, CHOLHDL, VLDL, LDLCALC Lab Results  Component Value Date   TSH 1.14 09/05/2017    Therapeutic Level Labs: No results found for: LITHIUM No results found for: VALPROATE No components found for:  CBMZ  Current Medications: Current Outpatient Medications  Medication Sig Dispense Refill  . budesonide-formoterol (SYMBICORT) 160-4.5 MCG/ACT inhaler Inhale 2 puffs into the lungs daily as needed.    Marland Kitchen buPROPion (WELLBUTRIN XL) 150 MG 24 hr tablet 450 mg daily (150 mg + 300 mg) 30 tablet 0  . buPROPion (WELLBUTRIN XL) 300 MG 24 hr tablet 450 mg daily (150 mg + 300 mg) 90 tablet 0  . chlorthalidone (HYGROTON) 25 MG tablet TAKE 1 TABLET BY MOUTH EVERY DAY FOR HTN  3  . ferrous sulfate 325 (65 FE) MG tablet Take 325 mg by mouth 2  (two) times daily with a meal.    . ibuprofen (ADVIL,MOTRIN) 600 MG tablet 600 MG BY MOUTH EVERY 6 HOURS FOR 5 DAYS, THEN EVERY 6 HOURS AS NEEDED FOR PAIN.  DON'T TAKE MOBIC WHILE TAKING IBUPROFEN 60 tablet 0  . KLOR-CON M20 20 MEQ tablet TAKE 1 TABLET (20 MEQ TOTAL) BY MOUTH DAILY. TAKE 1 TAB TWICE DAILY FOR 3 DAYS THEN TAKE 1 TAB DAILY 33 tablet 0  . Multiple Vitamin (MULTIVITAMIN) tablet Take 1 tablet by mouth daily.    Marland Kitchen  naproxen sodium (ALEVE) 220 MG tablet Take 220 mg by mouth as needed.    . Potassium (POTASSIMIN PO) Take by mouth.    . sertraline (ZOLOFT) 50 MG tablet Take 1 tablet (50 mg total) by mouth at bedtime. 90 tablet 0  . traZODone (DESYREL) 50 MG tablet Take 1 tablet (50 mg total) by mouth at bedtime. 90 tablet 0  . Vitamin D, Ergocalciferol, (DRISDOL) 50000 units CAPS capsule Take 50,000 Units by mouth every Monday.      No current facility-administered medications for this visit.      Musculoskeletal: Strength & Muscle Tone: within normal limits Gait & Station: normal Patient leans: N/A  Psychiatric Specialty Exam: Review of Systems Immediate;   NA Psychiatric/Behavioral: Positive for depression. Negative for hallucinations, memory loss, substance abuse and suicidal ideas. The patient is nervous/anxious. The patient does not have insomnia.   All other systems reviewed and are negative.   Last menstrual period 10/22/2016.There is no height or weight on file to calculate BMI.  General Appearance: NA  Eye Contact:  NA  Speech:  Clear and Coherent  Volume:  Normal  Mood:  Depressed  Affect:  NA  Thought Process:  Coherent  Orientation:  Full (Time, Place, and Person)  Thought Content: Logical   Suicidal Thoughts:  No  Homicidal Thoughts:  No  Memory:  Immediate;   Good  Judgement:  Good  Insight:  Fair  Psychomotor Activity:  Normal  Concentration:  Concentration: Good and Attention Span: Good  Recall:  Good  Fund of Knowledge: Good  Language: Good   Akathisia:  No  Handed:  Right  AIMS (if indicated): not done  Assets:  Communication Skills Desire for Improvement  ADL's:  Intact  Cognition: WNL  Sleep:  Good   Screenings: GAD-7     Counselor from 12/25/2017 in [place]  Total GAD-7 Score  17    PHQ2-9     Counselor from 12/25/2017 in [place]  PHQ-2 Total Score  6  PHQ-9 Total Score  24       Assessment and Plan:  Hannah [name] is a 58 y.o. year old female with a history of depressionabnormal SPEP,OA,chronic anemia, hypertension, , who presents for follow up appointment for Moderate episode of recurrent major depressive disorder ([place])  # MDD, moderate, recurrent without psychotic features There has been overall improvement in depressive symptoms and anxiety in the context of being out from work.  Psychosocial stressors includes work, loss of her mother, demoralization due to medical condition and loss of her uncle.  Noted that the patient misunderstood the instruction and has not uptitrated bupropion.  However, given her improvement in mood symptoms, will plan to stay on the same medication regimen at this time.  Will continue bupropion as adjunctive treatment for depression.  She has no known history of seizure.  Will continue sertraline to target depression.  Discussed behavioral activation.   Although she reports better mood in the context of being out of work for her son, her mood symptoms is easily exacerbated under stress at work.  I would support that she has intermittent absences 30 hours/month to avoid relapse in her mood symptoms.  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  Plan I have reviewed and updated plans as below 1.Continue sertraline 50 mg daily(nausea at higher dose) 2.Continue bupropion 300 mg daily  3. Continue Trazodone  50 mg at night as needed for  sleep 4.Return to clinic in two months for 15 mins  The patient demonstrates the following risk factors for suicide: Chronic risk factors for suicide include:psychiatric disorder ofdepressionand medical illness of OA. Acute risk factorsfor suicide include: N/A. Protective factorsfor this patient include: positive social support, responsibility to others (children, family), coping skills and hope for the future. Considering these factors, the overall suicide risk at this point appears to below. Patientisappropriate for outpatient follow up.  Norman Clay, MD 08/23/2018, 4:58 PM

## 2018-08-21 ENCOUNTER — Telehealth (INDEPENDENT_AMBULATORY_CARE_PROVIDER_SITE_OTHER): Payer: Self-pay | Admitting: *Deleted

## 2018-08-21 NOTE — Telephone Encounter (Signed)
Called pt and asked COVID-19 Pre-Screening Questions and they answered no to all questions listed. 

## 2018-08-22 ENCOUNTER — Other Ambulatory Visit: Payer: Self-pay

## 2018-08-22 ENCOUNTER — Encounter (INDEPENDENT_AMBULATORY_CARE_PROVIDER_SITE_OTHER): Payer: Self-pay | Admitting: Orthopedic Surgery

## 2018-08-22 ENCOUNTER — Ambulatory Visit (INDEPENDENT_AMBULATORY_CARE_PROVIDER_SITE_OTHER): Payer: 59 | Admitting: Orthopedic Surgery

## 2018-08-22 DIAGNOSIS — M19011 Primary osteoarthritis, right shoulder: Secondary | ICD-10-CM

## 2018-08-22 NOTE — Progress Notes (Signed)
Office Visit Note   Patient: Hannah [name]           Date of Birth: 05-13-1961           MRN: [id number] Visit Date: 08/22/2018 Requested by: [name], MD [address] [place], [place] [address] PCP: Osborne Casco Fransico Him, MD  Subjective: Chief Complaint  Patient presents with  . Right Shoulder - Pain    HPI: Hannah [name] is a patient with right shoulder pain.  She has a known history of arthritis.  She is had 2 injections done about 2 years ago.  First 1 helped second 1 did not.  Radiographs on the hospital system demonstrate end-stage glenohumeral arthritis without much in terms of glenoid deformity and maintenance of the acromiohumeral distance.  MRI scan shows some tendinosis of the supraspinatus tendon but rotator cuff is intact.  Patient does hair.  The pain does radiate down her arm.  She has some catching when she walks.  She describes decreased motion and decreased strength.  Takes Aleve and Tylenol as needed.  Did well with right total knee replacement 3 years ago.              ROS: All systems reviewed are negative as they relate to the chief complaint within the history of present illness.  Patient denies  fevers or chills.   Assessment & Plan: Visit Diagnoses: No diagnosis found.  Plan: Impression is right shoulder pain and arthritis.  Plan is observation for now.  I think she is heading for shoulder replacement at sometime in the future but currently she is relatively not symptomatic enough to warrant that intervention.  We will see her back in 6 months and consider further intervention at that time.  She does have good external rotation range of motion at this time.  Follow-Up Instructions: Return in about 6 months (around 02/22/2019).   Orders:  No orders of the defined types were placed in this encounter.  No orders of the defined types were placed in this encounter.     Procedures: No procedures performed   Clinical Data: No additional findings.   Objective: Vital Signs: LMP 10/22/2016 (Exact Date)   Physical Exam:   Constitutional: Patient appears well-developed HEENT:  Head: Normocephalic Eyes:EOM are normal Neck: Normal range of motion Cardiovascular: Normal rate Pulmonary/chest: Effort normal Neurologic: Patient is alert Skin: Skin is warm Psychiatric: Patient has normal mood and affect    Ortho Exam: Ortho exam demonstrates full active and passive range of motion of the cervical spine.  Right shoulder demonstrates full forward flexion to about 150 and isolated real abduction to about 95.  This is slightly less on the left-hand side.  Rotator cuff strength is excellent infraspinatus supraspinatus and subscap muscle testing.  Radial pulse intact bilaterally.  No other masses lymphadenopathy or skin changes noted in that shoulder girdle region.  Specialty Comments:  No specialty comments available.  Imaging: No results found.   PMFS History: Patient Active Problem List   Diagnosis Date Noted  . 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 Major depression, recurrent ([place]) 01/12/2018  . Chronic right shoulder pain 12/20/2017  . Smoldering multiple myeloma ([place]) 11/28/2017  . MDD (major depressive disorder), recurrent episode, moderate ([place]) 11/04/2017  . MGUS (monoclonal gammopathy of unknown significance) 10/24/2017  . Primary osteoarthritis of both hands 09/26/2017  . Primary osteoarthritis of right shoulder 09/26/2017  . Primary osteoarthritis of both hips 09/26/2017  . Status post total knee replacement, right 09/26/2017  .  Primary osteoarthritis of both feet 09/26/2017  . History of asthma 09/26/2017  . Primary osteoarthritis of left knee 04/18/2017  . Menorrhagia 11/04/2016  . Fibroid tumor 11/04/2016  . Adenomyosis 11/04/2016  . Hypertension, essential 11/04/2016  . 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 Allergy   . Anemia   . Anxiety   . Arthritis   .  Asthma   . Depression   . Hypertension     Family History  Problem Relation Age of Onset  . Cancer Mother        uterine   . Asthma Mother   . Arthritis Daughter   . Depression Maternal Uncle   . Colon cancer Neg Hx     Past Surgical History:  Procedure Laterality Date  . CHOLECYSTECTOMY  1993  . COLONOSCOPY    . 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 TOTAL KNEE ARTHROPLASTY Right 01/28/2016   Procedure: RIGHT TOTAL KNEE ARTHROPLASTY;  Surgeon: [name], MD;  Location: [facility];  Service: Orthopedics;  Laterality: Right;  . TRIGGER FINGER RELEASE Right 06/18/2014   Procedure: RIGHT LONG FINGER TRIGGER RELEASE;  Surgeon: [name], MD;  Location: [facility];  Service: Orthopedics;  Laterality: Right;  . TUBAL LIGATION    . VAGINAL HYSTERECTOMY Bilateral 11/04/2016   Procedure: HYSTERECTOMY VAGINAL with Bilateral Salpingectomy;  Surgeon: [name], MD;  Location: [facility];  Service: Gynecology;  Laterality: Bilateral;   Social History   Occupational History  . Not on file  Tobacco Use  . Smoking status: Never Smoker  . Smokeless tobacco: Never Used  Substance and Sexual Activity  . Alcohol use: No    Alcohol/week: 0.0 standard drinks  . Drug use: No  . Sexual activity: Yes    Partners: Male    Birth control/protection: Surgical

## 2018-08-23 ENCOUNTER — Ambulatory Visit (INDEPENDENT_AMBULATORY_CARE_PROVIDER_SITE_OTHER): Payer: 59 | Admitting: Psychiatry

## 2018-08-23 ENCOUNTER — Encounter (HOSPITAL_COMMUNITY): Payer: Self-pay | Admitting: Psychiatry

## 2018-08-23 DIAGNOSIS — F331 Major depressive disorder, recurrent, moderate: Secondary | ICD-10-CM

## 2018-08-23 NOTE — Patient Instructions (Signed)
1.Continue sertraline 50 mg daily(nausea at higher dose) 2.Continue bupropion 300 mg daily  3. Continue Trazodone 50 mg at night as needed for sleep 4.Return to clinic in two months for 15 mins

## 2018-08-29 ENCOUNTER — Inpatient Hospital Stay: Payer: 59 | Attending: Nurse Practitioner

## 2018-08-29 ENCOUNTER — Ambulatory Visit: Payer: Self-pay | Admitting: Hematology

## 2018-08-29 ENCOUNTER — Other Ambulatory Visit: Payer: Self-pay

## 2018-08-29 DIAGNOSIS — C9000 Multiple myeloma not having achieved remission: Secondary | ICD-10-CM | POA: Insufficient documentation

## 2018-08-29 DIAGNOSIS — E876 Hypokalemia: Secondary | ICD-10-CM | POA: Diagnosis not present

## 2018-08-29 DIAGNOSIS — I10 Essential (primary) hypertension: Secondary | ICD-10-CM | POA: Insufficient documentation

## 2018-08-29 DIAGNOSIS — E669 Obesity, unspecified: Secondary | ICD-10-CM | POA: Diagnosis not present

## 2018-08-29 DIAGNOSIS — F419 Anxiety disorder, unspecified: Secondary | ICD-10-CM | POA: Diagnosis not present

## 2018-08-29 DIAGNOSIS — D509 Iron deficiency anemia, unspecified: Secondary | ICD-10-CM | POA: Diagnosis not present

## 2018-08-29 DIAGNOSIS — Z90710 Acquired absence of both cervix and uterus: Secondary | ICD-10-CM | POA: Insufficient documentation

## 2018-08-29 DIAGNOSIS — F329 Major depressive disorder, single episode, unspecified: Secondary | ICD-10-CM | POA: Insufficient documentation

## 2018-08-29 DIAGNOSIS — J45909 Unspecified asthma, uncomplicated: Secondary | ICD-10-CM | POA: Insufficient documentation

## 2018-08-29 DIAGNOSIS — Z791 Long term (current) use of non-steroidal anti-inflammatories (NSAID): Secondary | ICD-10-CM | POA: Insufficient documentation

## 2018-08-29 DIAGNOSIS — Z79899 Other long term (current) drug therapy: Secondary | ICD-10-CM | POA: Diagnosis not present

## 2018-08-29 DIAGNOSIS — D472 Monoclonal gammopathy: Secondary | ICD-10-CM

## 2018-08-29 LAB — CBC WITH DIFFERENTIAL (CANCER CENTER ONLY)
Abs Immature Granulocytes: 0.02 10*3/uL (ref 0.00–0.07)
Basophils Absolute: 0 10*3/uL (ref 0.0–0.1)
Basophils Relative: 0 %
Eosinophils Absolute: 0.1 10*3/uL (ref 0.0–0.5)
Eosinophils Relative: 1 %
HCT: 37.2 % (ref 36.0–46.0)
Hemoglobin: 12.2 g/dL (ref 12.0–15.0)
Immature Granulocytes: 0 %
Lymphocytes Relative: 23 %
Lymphs Abs: 1.8 10*3/uL (ref 0.7–4.0)
MCH: 24.1 pg — ABNORMAL LOW (ref 26.0–34.0)
MCHC: 32.8 g/dL (ref 30.0–36.0)
MCV: 73.4 fL — ABNORMAL LOW (ref 80.0–100.0)
Monocytes Absolute: 0.4 10*3/uL (ref 0.1–1.0)
Monocytes Relative: 5 %
Neutro Abs: 5.5 10*3/uL (ref 1.7–7.7)
Neutrophils Relative %: 71 %
Platelet Count: 216 10*3/uL (ref 150–400)
RBC: 5.07 MIL/uL (ref 3.87–5.11)
RDW: 15.2 % (ref 11.5–15.5)
WBC Count: 7.7 10*3/uL (ref 4.0–10.5)
nRBC: 0 % (ref 0.0–0.2)

## 2018-08-29 LAB — CMP (CANCER CENTER ONLY)
ALT: 13 U/L (ref 0–44)
AST: 14 U/L — ABNORMAL LOW (ref 15–41)
Albumin: 3.6 g/dL (ref 3.5–5.0)
Alkaline Phosphatase: 83 U/L (ref 38–126)
Anion gap: 12 (ref 5–15)
BUN: 16 mg/dL (ref 6–20)
CO2: 27 mmol/L (ref 22–32)
Calcium: 8.9 mg/dL (ref 8.9–10.3)
Chloride: 100 mmol/L (ref 98–111)
Creatinine: 0.91 mg/dL (ref 0.44–1.00)
GFR, Est AFR Am: >60 mL/min (ref >60)
GFR, Estimated: >60 mL/min (ref >60)
Glucose, Bld: 107 mg/dL — ABNORMAL HIGH (ref 70–99)
Potassium: 3.2 mmol/L — ABNORMAL LOW (ref 3.5–5.1)
Sodium: 139 mmol/L (ref 135–145)
Total Bilirubin: 0.3 mg/dL (ref 0.3–1.2)
Total Protein: 8.2 g/dL — ABNORMAL HIGH (ref 6.5–8.1)

## 2018-08-29 LAB — FERRITIN: Ferritin: 103 ng/mL (ref 11–307)

## 2018-08-30 LAB — KAPPA/LAMBDA LIGHT CHAINS
Kappa free light chain: 22.7 mg/L — ABNORMAL HIGH (ref 3.3–19.4)
Kappa, lambda light chain ratio: 3.98 — ABNORMAL HIGH (ref 0.26–1.65)
Lambda free light chains: 5.7 mg/L (ref 5.7–26.3)

## 2018-08-30 LAB — MULTIPLE MYELOMA PANEL, SERUM
Albumin SerPl Elph-Mcnc: 3.6 g/dL (ref 2.9–4.4)
Albumin/Glob SerPl: 0.9 (ref 0.7–1.7)
Alpha 1: 0.2 g/dL (ref 0.0–0.4)
Alpha2 Glob SerPl Elph-Mcnc: 0.7 g/dL (ref 0.4–1.0)
B-Globulin SerPl Elph-Mcnc: 2.5 g/dL — ABNORMAL HIGH (ref 0.7–1.3)
Gamma Glob SerPl Elph-Mcnc: 0.7 g/dL (ref 0.4–1.8)
Globulin, Total: 4.2 g/dL — ABNORMAL HIGH (ref 2.2–3.9)
IgA: 2041 mg/dL — ABNORMAL HIGH (ref 87–352)
IgG (Immunoglobin G), Serum: 864 mg/dL (ref 586–1602)
IgM (Immunoglobulin M), Srm: 29 mg/dL (ref 26–217)
M Protein SerPl Elph-Mcnc: 1.7 g/dL — ABNORMAL HIGH
Total Protein ELP: 7.8 g/dL (ref 6.0–8.5)

## 2018-09-03 ENCOUNTER — Ambulatory Visit (HOSPITAL_COMMUNITY): Payer: 59 | Admitting: Licensed Clinical Social Worker

## 2018-09-03 NOTE — Progress Notes (Signed)
[facility]   Telephone:(336) [phone] Fax:(336) [phone]   Clinic Follow up Note   Patient Care Team: Tisovec, Fransico Him, MD as PCP - General (Internal Medicine) Bo Merino, MD as Consulting Physician (Rheumatology)   I connected with Hannah [name] on 09/05/2018 at  8:30 AM EDT by telephone and verified that I am speaking with the correct person using two identifiers.   I discussed the limitations, risks, security and privacy concerns of performing an evaluation and management service by telephone and the availability of in person appointments. I also discussed with the patient that there may be a patient responsible charge related to this service. The patient expressed understanding and agreed to proceed.   Patient's location:  Her home Provider's location:  My Office   CHIEF COMPLAINT: Follow-up anemia and smoldering multiple myeloma  CURRENT THERAPY:  Surveillance and oral iron  INTERVAL HISTORY:  Hannah [name] is here for a follow up of Smoldering Myeloma. She was last seen by me 6 months ago. She was able to identify herself by birth date. She notes she is doing well. She notes stable pain in her back and no new changes. She notes she is tolerating oral iron well and taking daily. She notes she no new changes.    REVIEW OF SYSTEMS:   Constitutional: Denies fevers, chills or abnormal weight loss Eyes: Denies blurriness of vision Ears, nose, mouth, throat, and face: Denies mucositis or sore throat Respiratory: Denies cough, dyspnea or wheezes Cardiovascular: Denies palpitation, chest discomfort or lower extremity swelling Gastrointestinal:  Denies nausea, heartburn or change in bowel habits Skin: Denies abnormal skin rashes MSK:(+) Stable, chronic back pain  Lymphatics: Denies new lymphadenopathy or easy bruising Neurological:Denies numbness, tingling or new weaknesses Behavioral/Psych: Mood is stable, no new changes  All other systems were reviewed  with the patient and are negative.  MEDICAL HISTORY:  Past Medical History:  Diagnosis Date  . Allergy   . Anemia   . Anxiety   . Arthritis   . Asthma   . Depression   . Hypertension     SURGICAL HISTORY: Past Surgical History:  Procedure Laterality Date  . CHOLECYSTECTOMY  1993  . COLONOSCOPY    . 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 TOTAL KNEE ARTHROPLASTY Right 01/28/2016   Procedure: RIGHT TOTAL KNEE ARTHROPLASTY;  Surgeon: [name], MD;  Location: [facility];  Service: Orthopedics;  Laterality: Right;  . TRIGGER FINGER RELEASE Right 06/18/2014   Procedure: RIGHT LONG FINGER TRIGGER RELEASE;  Surgeon: [name], MD;  Location: [facility];  Service: Orthopedics;  Laterality: Right;  . TUBAL LIGATION    . VAGINAL HYSTERECTOMY Bilateral 11/04/2016   Procedure: HYSTERECTOMY VAGINAL with Bilateral Salpingectomy;  Surgeon: [name], MD;  Location: [facility];  Service: Gynecology;  Laterality: Bilateral;    I have reviewed the social history and family history with the patient and they are unchanged from previous note.  ALLERGIES:  is allergic to sulfur.  MEDICATIONS:  Current Outpatient Medications  Medication Sig Dispense Refill  . budesonide-formoterol (SYMBICORT) 160-4.5 MCG/ACT inhaler Inhale 2 puffs into the lungs daily as needed.    Marland Kitchen buPROPion (WELLBUTRIN XL) 150 MG 24 hr tablet 450 mg daily (150 mg + 300 mg) 30 tablet 0  . buPROPion (WELLBUTRIN XL) 300 MG 24 hr tablet 450 mg daily (150 mg + 300 mg) 90 tablet 0  . chlorthalidone (HYGROTON) 25 MG tablet TAKE 1 TABLET BY MOUTH EVERY DAY  FOR HTN  3  . ferrous sulfate 325 (65 FE) MG tablet Take 325 mg by mouth 2 (two) times daily with a meal.    . ibuprofen (ADVIL,MOTRIN) 600 MG tablet 600 MG BY MOUTH EVERY 6 HOURS FOR 5 DAYS, THEN EVERY 6 HOURS AS NEEDED FOR PAIN.  DON'T TAKE MOBIC WHILE TAKING IBUPROFEN 60 tablet 0  . KLOR-CON M20 20 MEQ tablet TAKE 1 TABLET (20 MEQ TOTAL) BY MOUTH DAILY.  TAKE 1 TAB TWICE DAILY FOR 3 DAYS THEN TAKE 1 TAB DAILY 33 tablet 0  . Multiple Vitamin (MULTIVITAMIN) tablet Take 1 tablet by mouth daily.    . naproxen sodium (ALEVE) 220 MG tablet Take 220 mg by mouth as needed.    . Potassium (POTASSIMIN PO) Take by mouth.    . potassium chloride (KLOR-CON) 20 MEQ packet Take 20 mEq by mouth 2 (two) times daily. 30 packet 5  . sertraline (ZOLOFT) 50 MG tablet Take 1 tablet (50 mg total) by mouth at bedtime. 90 tablet 0  . traZODone (DESYREL) 50 MG tablet Take 1 tablet (50 mg total) by mouth at bedtime. 90 tablet 0  . Vitamin D, Ergocalciferol, (DRISDOL) 50000 units CAPS capsule Take 50,000 Units by mouth every Monday.      No current facility-administered medications for this visit.     PHYSICAL EXAMINATION: ECOG PERFORMANCE STATUS: 1 - Symptomatic but completely ambulatory  exam not performed today   LABORATORY DATA:  I have reviewed the data as listed CBC Latest Ref Rng & Units 08/29/2018 06/01/2018 02/21/2018  WBC 4.0 - 10.5 K/uL 7.7 7.7 6.1  Hemoglobin 12.0 - 15.0 g/dL 12.2 11.6(L) 11.2(L)  Hematocrit 36.0 - 46.0 % 37.2 35.7(L) 34.4(L)  Platelets 150 - 400 K/uL 216 208 199     CMP Latest Ref Rng & Units 08/29/2018 06/01/2018 02/21/2018  Glucose 70 - 99 mg/dL 107(H) 102(H) 97  BUN 6 - 20 mg/dL 16 14 15   Creatinine 0.44 - 1.00 mg/dL 0.91 0.82 0.83  Sodium 135 - 145 mmol/L 139 142 143  Potassium 3.5 - 5.1 mmol/L 3.2(L) 3.6 3.4(L)  Chloride 98 - 111 mmol/L 100 103 101  CO2 22 - 32 mmol/L 27 29 34(H)  Calcium 8.9 - 10.3 mg/dL 8.9 9.1 9.1  Total Protein 6.5 - 8.1 g/dL 8.2(H) 7.8 7.7  Total Bilirubin 0.3 - 1.2 mg/dL 0.3 0.3 0.5  Alkaline Phos 38 - 126 U/L 83 81 72  AST 15 - 41 U/L 14(L) 15 14(L)  ALT 0 - 44 U/L 13 14 12       RADIOGRAPHIC STUDIES: I have personally reviewed the radiological images as listed and agreed with the findings in the report. No results found.   ASSESSMENT & PLAN:  Hannah [name] is a 58 y.o. female with   1.  Smoldering multiple myeloma, IgA Kappa type -We previously reviewed her medical chart including labs and imaging in detail with the patient.  -Her previous outside test showed elevated M protein 1.2g/dl, elevated IgA level, light chain levels were within normal limits and ratio was slightly increased.  She does have mild anemia, likely related to iron deficiency, no renal failure, hypercalcemia, neuropathy, and her previous surgery was negative. -Discussed her bone marrow biopsy findings from 10/2017. She has increased plasma (6% on aspirate, 20% by CD 138).  I previously discussed with pathologist Dr. Gari Crown, the increased plasma cells in bone marrow is likely between 10 to 20%, and would favor monitoring multiple myeloma than MGUS -Also patient does  not meet CRAB for MM, she does have low back and hip pain which started 6-12 months ago. I previously obtained a pelvic and lumbar MRI for further evaluation to see if she has MM bone changes to further rule out MM -Reviewed the natural course of smoldering multiple myeloma, and the risk of transforming to multiple myeloma (5-10% yearly).  I recommend close follow-up by repeating lab every 3 months she agrees. -She is clinically doing well. Last week's labs reviewed, CBC, CMP and Ferritin WNL except K 3.2, BG 107. I will refill her oral potassium and advised her to increase potassium in her diet. She is on  chlorthalidone which is a diuretic, so I recommend she continue on potassium daily.  -Her MM panel shows slight increase with M-protein at 1.7 and IgA 2041, slightly increased,  with light chains 22.7, 5.7, 3.98. Overall smoldering Myeloma is mainly stable but trending up. -We discussed that current standard care for smoldering multiple myeloma is close clinical monitoring, although there are clinical trials to evaluate if earlier treatment is needed  -Discussed multiple myeloma related clinical symptoms, and what to watch, pt voiced good understanding. Would  repeat bone marrow biopsy at that time.  -Her back pain is stable and tolerable. I advised her to call if her pain worsens.  -Labs in 3 months and f/u in 6 months. Will repeat bone survey before next visit.   2. 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  -Hemoglobin electrophoresis showed increase in Hg C, consistent with Hg C trait. I discussed the results with her and informed her that her children are at risk of hemoglobin C trait, and I encouraged him to be tested. -Labs from 08/29/18 reviewed, CBC showed anemia resolved, ferritin WNL.  -Continue oral iron daily.   3. HTN -controlled on chlorthalidone.  -followed by PCP   4. OA -She has imaging evidence consistent with OA in multiple joints. Pain currently controlled with NSAIDs and tylenol PRN.  -she was evaluated by rheumatology, will f/u there on PRN basis   5. Anxiety, depression -under care of [facility] provider, stable and tolerating zoloft   6. Hypokalemia -likely secondary to diuretic, chlorthalidone -Labs from 08/29/18 reviewed, CMP showed K 3.2 -Continue KCL daily, and increase in potassium rich foods. I refilled today (09/05/18)  7. Obesity -She is trying to lose weight and exercise  -I previously offered to refer her to a weight loss program, but she states that she is trying on her own with diet and exercise.    8. Cancer Screening -f/u with PCP, she has had screening colonoscopy, and annual mammogram  PLAN:  -continue iron pills once daily  -refilled potassium today -Labs in 3 months -F/u in 6 months with labs and bone survey one week before    No problem-specific Assessment & Plan notes found for this encounter.   Orders Placed This Encounter  Procedures  . DG Bone Survey Met    Standing Status:   Future    Standing Expiration Date:   11/05/2019    Order Specific Question:   Reason for Exam (SYMPTOM  OR DIAGNOSIS REQUIRED)     Answer:   rule out myeloma bone lesions    Order Specific Question:   Is patient pregnant?    Answer:   No    Order Specific Question:   Preferred imaging location?    Answer:   [facility]    Order  Specific Question:   Radiology Contrast Protocol - do NOT remove file path    Answer:   [email or web address]   All questions were answered. The patient knows to call the clinic with any problems, questions or concerns. No barriers to learning was detected. I spent 15 minutes counseling the patient over the [facility]. The total time spent in the appointment was 20 minutes and more than 50% was on counseling and review of test results     Truitt Merle, MD 09/05/2018   I, Joslyn Devon, am acting as scribe for Truitt Merle, MD.   I have reviewed the above documentation for accuracy and completeness, and I agree with the above.

## 2018-09-05 ENCOUNTER — Encounter: Payer: Self-pay | Admitting: Hematology

## 2018-09-05 ENCOUNTER — Inpatient Hospital Stay (HOSPITAL_BASED_OUTPATIENT_CLINIC_OR_DEPARTMENT_OTHER): Payer: 59 | Admitting: Hematology

## 2018-09-05 ENCOUNTER — Other Ambulatory Visit: Payer: Self-pay

## 2018-09-05 ENCOUNTER — Telehealth: Payer: Self-pay | Admitting: Hematology

## 2018-09-05 DIAGNOSIS — D582 Other hemoglobinopathies: Secondary | ICD-10-CM

## 2018-09-05 DIAGNOSIS — D472 Monoclonal gammopathy: Secondary | ICD-10-CM

## 2018-09-05 DIAGNOSIS — C9000 Multiple myeloma not having achieved remission: Secondary | ICD-10-CM

## 2018-09-05 MED ORDER — POTASSIUM CHLORIDE 20 MEQ PO PACK: 20.0000 meq | PACK | Freq: Two times a day (BID) | ORAL | 5 refills | Status: DC

## 2018-09-05 NOTE — Telephone Encounter (Signed)
Scheduled appt per 4/8 sch message.   Patient aware of scheduled appts.

## 2018-10-15 ENCOUNTER — Other Ambulatory Visit (HOSPITAL_COMMUNITY): Payer: Self-pay | Admitting: Psychiatry

## 2018-10-17 NOTE — Progress Notes (Deleted)
[facility] MD/PA/NP OP Progress Note  10/17/2018 11:08 AM Hannah [name]  MRN:  [id number]  Chief Complaint:  HPI: *** Visit Diagnosis: No diagnosis found.  Past Psychiatric History: Please see initial evaluation for full details. I have reviewed the history. No updates at this time.     Past Medical History:  Past Medical History:  Diagnosis Date  . Allergy   . Anemia   . Anxiety   . Arthritis   . Asthma   . Depression   . Hypertension     Past Surgical History:  Procedure Laterality Date  . CHOLECYSTECTOMY  1993  . COLONOSCOPY    . 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 TOTAL KNEE ARTHROPLASTY Right 01/28/2016   Procedure: RIGHT TOTAL KNEE ARTHROPLASTY;  Surgeon: Hannah Koyanagi, MD;  Location: [facility];  Service: Orthopedics;  Laterality: Right;  . TRIGGER FINGER RELEASE Right 06/18/2014   Procedure: RIGHT LONG FINGER TRIGGER RELEASE;  Surgeon: Hannah Payment, MD;  Location: [facility];  Service: Orthopedics;  Laterality: Right;  . TUBAL LIGATION    . VAGINAL HYSTERECTOMY Bilateral 11/04/2016   Procedure: HYSTERECTOMY VAGINAL with Bilateral Salpingectomy;  Surgeon: Hannah Manges, MD;  Location: [facility];  Service: Gynecology;  Laterality: Bilateral;    Family Psychiatric History: Please see initial evaluation for full details. I have reviewed the history. No updates at this time.     Family History:  Family History  Problem Relation Age of Onset  . Cancer Mother        uterine   . Asthma Mother   . Arthritis Daughter   . Depression Maternal Uncle   . Colon cancer Neg Hx     Social History:  Social History   Socioeconomic History  . Marital status: Married    Spouse name: Not on file  . Number of children: Not on file  . Years of education: Not on file  . Highest education level: Not on file  Occupational History  . Not on file  Social Needs  . Financial resource strain: Not hard at all  . Food insecurity:    Worry: Never true     Inability: Never true  . Transportation needs:    Medical: No    Non-medical: No  Tobacco Use  . Smoking status: Never Smoker  . Smokeless tobacco: Never Used  Substance and Sexual Activity  . Alcohol use: No    Alcohol/week: 0.0 standard drinks  . Drug use: No  . Sexual activity: Yes    Partners: Male    Birth control/protection: Surgical  Lifestyle  . Physical activity:    Days per week: 0 days    Minutes per session: 0 min  . Stress: Very much  Relationships  . Social connections:    Talks on phone: More than three times a week    Gets together: Once a week    Attends religious service: More than 4 times per year    Active member of club or organization: Yes    Attends meetings of clubs or organizations: More than 4 times per year    Relationship status: Married  Other Topics Concern  . Not on file  Social History Narrative  . Not on file    Allergies:  Allergies  Allergen Reactions  . Sulfur Swelling    Facial swelling     Metabolic Disorder Labs: No results found for: HGBA1C, MPG No results found for: PROLACTIN No results found for: CHOL, TRIG, HDL,  CHOLHDL, VLDL, LDLCALC Lab Results  Component Value Date   TSH 1.14 09/05/2017    Therapeutic Level Labs: No results found for: LITHIUM No results found for: VALPROATE No components found for:  CBMZ  Current Medications: Current Outpatient Medications  Medication Sig Dispense Refill  . budesonide-formoterol (SYMBICORT) 160-4.5 MCG/ACT inhaler Inhale 2 puffs into the lungs daily as needed.    Marland Kitchen buPROPion (WELLBUTRIN XL) 150 MG 24 hr tablet 450 mg daily (150 mg + 300 mg) 30 tablet 0  . buPROPion (WELLBUTRIN XL) 300 MG 24 hr tablet 450 mg daily (150 mg + 300 mg) 90 tablet 0  . chlorthalidone (HYGROTON) 25 MG tablet TAKE 1 TABLET BY MOUTH EVERY DAY FOR HTN  3  . ferrous sulfate 325 (65 FE) MG tablet Take 325 mg by mouth 2 (two) times daily with a meal.    . ibuprofen (ADVIL,MOTRIN) 600 MG tablet 600 MG BY  MOUTH EVERY 6 HOURS FOR 5 DAYS, THEN EVERY 6 HOURS AS NEEDED FOR PAIN.  DON'T TAKE MOBIC WHILE TAKING IBUPROFEN 60 tablet 0  . KLOR-CON M20 20 MEQ tablet TAKE 1 TABLET (20 MEQ TOTAL) BY MOUTH DAILY. TAKE 1 TAB TWICE DAILY FOR 3 DAYS THEN TAKE 1 TAB DAILY 33 tablet 0  . Multiple Vitamin (MULTIVITAMIN) tablet Take 1 tablet by mouth daily.    . naproxen sodium (ALEVE) 220 MG tablet Take 220 mg by mouth as needed.    . Potassium (POTASSIMIN PO) Take by mouth.    . potassium chloride (KLOR-CON) 20 MEQ packet Take 20 mEq by mouth 2 (two) times daily. 30 packet 5  . sertraline (ZOLOFT) 50 MG tablet Take 1 tablet (50 mg total) by mouth at bedtime. 90 tablet 0  . traZODone (DESYREL) 50 MG tablet Take 1 tablet (50 mg total) by mouth at bedtime. 90 tablet 0  . Vitamin D, Ergocalciferol, (DRISDOL) 50000 units CAPS capsule Take 50,000 Units by mouth every Monday.      No current facility-administered medications for this visit.      Musculoskeletal: Strength & Muscle Tone: N/A Gait & Station: N/A Patient leans: N/A  Psychiatric Specialty Exam: ROS  Last menstrual period 10/22/2016.There is no height or weight on file to calculate BMI.  General Appearance: {Appearance:22683}  Eye Contact:  {BHH EYE CONTACT:22684}  Speech:  Clear and Coherent  Volume:  Normal  Mood:  {BHH MOOD:22306}  Affect:  {Affect (PAA):22687}  Thought Process:  Coherent  Orientation:  Full (Time, Place, and Person)  Thought Content: Logical   Suicidal Thoughts:  {ST/HT (PAA):22692}  Homicidal Thoughts:  {ST/HT (PAA):22692}  Memory:  Immediate;   Good  Judgement:  {Judgement (PAA):22694}  Insight:  {Insight (PAA):22695}  Psychomotor Activity:  Normal  Concentration:  Concentration: Good and Attention Span: Good  Recall:  Good  Fund of Knowledge: Good  Language: Good  Akathisia:  No  Handed:  Right  AIMS (if indicated): not done  Assets:  Communication Skills Desire for Improvement  ADL's:  Intact  Cognition: WNL   Sleep:  {BHH GOOD/FAIR/POOR:22877}   Screenings: GAD-7     Counselor from 12/25/2017 in [facility]  Total GAD-7 Score  17    PHQ2-9     Counselor from 12/25/2017 in [facility]  PHQ-2 Total Score  6  PHQ-9 Total Score  24       Assessment and Plan:  Hannah [name] is a 58 y.o. year old female with a history of depression,  abnormal SPEP,OA,chronic anemia, hypertension, , who presents for follow up appointment for No diagnosis found.  # MDD, moderate, recurrent without psychotic features  There has been overall improvement in depressive symptoms and anxiety in the context of being out from work.  Psychosocial stressors includes work, loss of her mother, demoralization due to medical condition and loss of her uncle.  Noted that the patient misunderstood the instruction and has not uptitrated bupropion.  However, given her improvement in mood symptoms, will plan to stay on the same medication regimen at this time.  Will continue bupropion as adjunctive treatment for depression.  She has no known history of seizure.  Will continue sertraline to target depression.  Discussed behavioral activation.   Although she reports better mood in the context of being out of work for her son, her mood symptoms is easily exacerbated under stress at work.  I would support that she has intermittent absences 30 hours/month to avoid relapse in her mood symptoms.  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  Plan  1.Continue sertraline 50 mg daily(nausea at higher dose) 2.Continue bupropion 300 mg daily  3. Continue Trazodone 50 mg at night as needed for sleep 4.Return to clinic in two months for 15 mins  The patient demonstrates the following risk factors for suicide: Chronic risk factors for suicide include:psychiatric disorder ofdepressionand medical  illness of OA. Acute risk factorsfor suicide include: N/A. Protective factorsfor this patient include: positive social support, responsibility to others (children, family), coping skills and hope for the future. Considering these factors, the overall suicide risk at this point appears to below. Patientisappropriate for outpatient follow up.   Norman Clay, MD 10/17/2018, 11:08 AM

## 2018-10-23 ENCOUNTER — Ambulatory Visit (HOSPITAL_COMMUNITY): Payer: 59 | Admitting: Psychiatry

## 2018-12-05 ENCOUNTER — Inpatient Hospital Stay: Payer: Self-pay | Attending: Nurse Practitioner

## 2018-12-08 ENCOUNTER — Other Ambulatory Visit: Payer: Self-pay | Admitting: Hematology

## 2018-12-13 ENCOUNTER — Other Ambulatory Visit (HOSPITAL_COMMUNITY): Payer: Self-pay | Admitting: Psychiatry

## 2018-12-13 MED ORDER — TRAZODONE HCL 50 MG PO TABS: 50.0000 mg | ORAL_TABLET | Freq: Every day | ORAL | 0 refills | Status: DC

## 2019-02-13 DIAGNOSIS — C9000 Multiple myeloma not having achieved remission: Secondary | ICD-10-CM | POA: Diagnosis not present

## 2019-02-13 DIAGNOSIS — I10 Essential (primary) hypertension: Secondary | ICD-10-CM | POA: Diagnosis not present

## 2019-02-13 DIAGNOSIS — D582 Other hemoglobinopathies: Secondary | ICD-10-CM | POA: Diagnosis not present

## 2019-02-13 DIAGNOSIS — Z0000 Encounter for general adult medical examination without abnormal findings: Secondary | ICD-10-CM | POA: Diagnosis not present

## 2019-02-13 DIAGNOSIS — F3341 Major depressive disorder, recurrent, in partial remission: Secondary | ICD-10-CM | POA: Diagnosis not present

## 2019-02-13 DIAGNOSIS — M159 Polyosteoarthritis, unspecified: Secondary | ICD-10-CM | POA: Diagnosis not present

## 2019-02-22 ENCOUNTER — Other Ambulatory Visit: Payer: Self-pay

## 2019-02-22 ENCOUNTER — Encounter: Payer: Self-pay | Admitting: Orthopedic Surgery

## 2019-02-22 ENCOUNTER — Ambulatory Visit (INDEPENDENT_AMBULATORY_CARE_PROVIDER_SITE_OTHER): Payer: BC Managed Care – PPO | Admitting: Orthopedic Surgery

## 2019-02-22 DIAGNOSIS — M19011 Primary osteoarthritis, right shoulder: Secondary | ICD-10-CM | POA: Diagnosis not present

## 2019-02-22 DIAGNOSIS — M545 Low back pain, unspecified: Secondary | ICD-10-CM

## 2019-02-22 NOTE — Progress Notes (Addendum)
Office Visit Note   Patient: Hannah [name]           Date of Birth: 09-04-1960           MRN: [id number] Visit Date: 02/22/2019 Requested by: [name], MD [address] [place],  [place] [address] PCP: Osborne Casco Fransico Him, MD  Subjective: Chief Complaint  Patient presents with  . Right Shoulder - Follow-up    HPI: Hannah [name] is a 58 y.o. female who presents to the office complaining of R shoulder and low back pain.  Pt was last seen for the R shoulder 6 months ago.  She complains of ongoing R shoulder pain that is worse with lifting, laying on her R side at night, and overhead motion.  The pain wakes her at night occasionally.  She has tried Aleve, Tylenol, and a stretching routine with some relief.  She has never had surgery on the R shoulder and has had 2 injections 2 years ago.  Her pain radiates to her elbow but she denies any numbness/tingling or neck pain.    She also reports low back pain that is getting worse.  Denies any weakness or radicular leg symptoms.  She had a lumbar MRI 1 year ago that showed facet arthritis and bone edema.              ROS:  All systems reviewed are negative as they relate to the chief complaint within the history of present illness.  Patient denies fevers or chills.  Assessment & Plan: Visit Diagnoses:  1. Primary osteoarthritis of right shoulder     Plan: Patient is a 58 year old female who presents complaining of right shoulder and lumbar back pain.  Right shoulder impression is right shoulder arthritis.  Patient has had a history of right shoulder pain but that is not her major concern in the office today and she does not request on injection today.  Her main complaint today in the clinic is lumbar back pain.  She has had a previous MRI about a year ago that showed facet arthritis.  Recommended that patient try turmeric as well as 1-2 sessions of physical therapy with transition to a home exercise program.  Will refer patient for ESI's  with Dr. [name] as well.  Patient will bring a copy of her lumbar spine MRI to physical therapy.  Patient will follow-up with the office as needed.  From Dr. [name].  Patient seen and examined.  Right shoulder early arthritis symptomatic but not excessively so at this time.  No intervention required for that right now.  More symptomatic is the patient's low back.  Plan referral to Dr. [name] for lumbar ESI and physical therapy.  We can see her back as needed for further treatment on the shoulder when it becomes symptomatic.   Follow-Up Instructions: No follow-ups on file.   Orders:  No orders of the defined types were placed in this encounter.  No orders of the defined types were placed in this encounter.     Procedures: No procedures performed   Clinical Data: No additional findings.  Objective: Vital Signs: LMP 10/22/2016 (Exact Date)   Physical Exam:  Constitutional: Patient appears well-developed HEENT:  Head: Normocephalic Eyes:EOM are normal Neck: Normal range of motion Cardiovascular: Normal rate Pulmonary/chest: Effort normal Neurologic: Patient is alert Skin: Skin is warm Psychiatric: Patient has normal mood and affect  Ortho Exam:  Right shoulder Exam Able to fully forward flex and abduct shoulder overhead No loss  of ER relative to the other shoulder.  Good endpoint with ER No TTP over the [facility] joint or bicipital groove 5/5 motor strength of the subscapularis, supraspinatus, and infraspinatus muscles Positive Hawkins impingement 5/5 grip strength, forearm pronation/supination, and bicep strength No grinding with passive range of motion of the shoulder  Mild TTP throughout the axial lumbar spine Negative straight leg raise bilaterally 5/5 motor strength of the bilateral lower extremities  Specialty Comments:  No specialty comments available.  Imaging: No results found.   PMFS History: Patient Active Problem List   Diagnosis Date Noted  . 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 Major depression, recurrent ([facility]) 01/12/2018  . Chronic right shoulder pain 12/20/2017  . Smoldering multiple myeloma ([facility]) 11/28/2017  . MDD (major depressive disorder), recurrent episode, moderate ([facility]) 11/04/2017  . MGUS (monoclonal gammopathy of unknown significance) 10/24/2017  . Primary osteoarthritis of both hands 09/26/2017  . Primary osteoarthritis of right shoulder 09/26/2017  . Primary osteoarthritis of both hips 09/26/2017  . Status post total knee replacement, right 09/26/2017  . Primary osteoarthritis of both feet 09/26/2017  . History of asthma 09/26/2017  . Primary osteoarthritis of left knee 04/18/2017  . Menorrhagia 11/04/2016  . Fibroid tumor 11/04/2016  . Adenomyosis 11/04/2016  . Hypertension, essential 11/04/2016  . 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 Allergy   . Anemia   . Anxiety   . Arthritis   . Asthma   . Depression   . Hypertension     Family History  Problem Relation Age of Onset  . Cancer Mother        uterine   . Asthma Mother   . Arthritis Daughter   . Depression Maternal Uncle   . Colon cancer Neg Hx     Past Surgical History:  Procedure Laterality Date  . CHOLECYSTECTOMY  1993  . COLONOSCOPY    . 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 TOTAL KNEE ARTHROPLASTY Right 01/28/2016   Procedure: RIGHT TOTAL KNEE ARTHROPLASTY;  Surgeon: [name], MD;  Location: [facility];  Service: Orthopedics;  Laterality: Right;  . TRIGGER FINGER RELEASE Right 06/18/2014   Procedure: RIGHT LONG FINGER TRIGGER RELEASE;  Surgeon: [name], MD;  Location: [facility];  Service: Orthopedics;  Laterality: Right;  . TUBAL LIGATION    . VAGINAL HYSTERECTOMY Bilateral 11/04/2016   Procedure: HYSTERECTOMY VAGINAL with Bilateral Salpingectomy;  Surgeon: [name], MD;  Location: [facility];  Service: Gynecology;  Laterality: Bilateral;   Social History    Occupational History  . Not on file  Tobacco Use  . Smoking status: Never Smoker  . Smokeless tobacco: Never Used  Substance and Sexual Activity  . Alcohol use: No    Alcohol/week: 0.0 standard drinks  . Drug use: No  . Sexual activity: Yes    Partners: Male    Birth control/protection: Surgical

## 2019-02-24 ENCOUNTER — Encounter: Payer: Self-pay | Admitting: Orthopedic Surgery

## 2019-02-27 ENCOUNTER — Other Ambulatory Visit: Payer: Self-pay

## 2019-02-27 DIAGNOSIS — D472 Monoclonal gammopathy: Secondary | ICD-10-CM

## 2019-02-27 DIAGNOSIS — C9000 Multiple myeloma not having achieved remission: Secondary | ICD-10-CM

## 2019-02-28 ENCOUNTER — Other Ambulatory Visit: Payer: Self-pay

## 2019-02-28 ENCOUNTER — Inpatient Hospital Stay: Payer: BC Managed Care – PPO | Attending: Nurse Practitioner

## 2019-02-28 DIAGNOSIS — J45909 Unspecified asthma, uncomplicated: Secondary | ICD-10-CM | POA: Diagnosis not present

## 2019-02-28 DIAGNOSIS — E669 Obesity, unspecified: Secondary | ICD-10-CM | POA: Diagnosis not present

## 2019-02-28 DIAGNOSIS — D509 Iron deficiency anemia, unspecified: Secondary | ICD-10-CM | POA: Insufficient documentation

## 2019-02-28 DIAGNOSIS — I10 Essential (primary) hypertension: Secondary | ICD-10-CM | POA: Insufficient documentation

## 2019-02-28 DIAGNOSIS — Z79899 Other long term (current) drug therapy: Secondary | ICD-10-CM | POA: Insufficient documentation

## 2019-02-28 DIAGNOSIS — R5383 Other fatigue: Secondary | ICD-10-CM | POA: Diagnosis not present

## 2019-02-28 DIAGNOSIS — Z791 Long term (current) use of non-steroidal anti-inflammatories (NSAID): Secondary | ICD-10-CM | POA: Diagnosis not present

## 2019-02-28 DIAGNOSIS — D472 Monoclonal gammopathy: Secondary | ICD-10-CM

## 2019-02-28 DIAGNOSIS — Z7951 Long term (current) use of inhaled steroids: Secondary | ICD-10-CM | POA: Diagnosis not present

## 2019-02-28 DIAGNOSIS — G8929 Other chronic pain: Secondary | ICD-10-CM | POA: Diagnosis not present

## 2019-02-28 DIAGNOSIS — M549 Dorsalgia, unspecified: Secondary | ICD-10-CM | POA: Insufficient documentation

## 2019-02-28 DIAGNOSIS — C9000 Multiple myeloma not having achieved remission: Secondary | ICD-10-CM | POA: Diagnosis not present

## 2019-02-28 DIAGNOSIS — F419 Anxiety disorder, unspecified: Secondary | ICD-10-CM | POA: Insufficient documentation

## 2019-02-28 DIAGNOSIS — F329 Major depressive disorder, single episode, unspecified: Secondary | ICD-10-CM | POA: Diagnosis not present

## 2019-02-28 LAB — CBC WITH DIFFERENTIAL (CANCER CENTER ONLY)
Abs Immature Granulocytes: 0.02 10*3/uL (ref 0.00–0.07)
Basophils Absolute: 0 10*3/uL (ref 0.0–0.1)
Basophils Relative: 0 %
Eosinophils Absolute: 0.1 10*3/uL (ref 0.0–0.5)
Eosinophils Relative: 1 %
HCT: 35.9 % — ABNORMAL LOW (ref 36.0–46.0)
Hemoglobin: 11.8 g/dL — ABNORMAL LOW (ref 12.0–15.0)
Immature Granulocytes: 0 %
Lymphocytes Relative: 29 %
Lymphs Abs: 2.2 10*3/uL (ref 0.7–4.0)
MCH: 23.8 pg — ABNORMAL LOW (ref 26.0–34.0)
MCHC: 32.9 g/dL (ref 30.0–36.0)
MCV: 72.5 fL — ABNORMAL LOW (ref 80.0–100.0)
Monocytes Absolute: 0.3 10*3/uL (ref 0.1–1.0)
Monocytes Relative: 4 %
Neutro Abs: 4.9 10*3/uL (ref 1.7–7.7)
Neutrophils Relative %: 66 %
Platelet Count: 215 10*3/uL (ref 150–400)
RBC: 4.95 MIL/uL (ref 3.87–5.11)
RDW: 15 % (ref 11.5–15.5)
WBC Count: 7.5 10*3/uL (ref 4.0–10.5)
nRBC: 0 % (ref 0.0–0.2)

## 2019-03-01 LAB — KAPPA/LAMBDA LIGHT CHAINS
Kappa free light chain: 31.2 mg/L — ABNORMAL HIGH (ref 3.3–19.4)
Kappa, lambda light chain ratio: 4.73 — ABNORMAL HIGH (ref 0.26–1.65)
Lambda free light chains: 6.6 mg/L (ref 5.7–26.3)

## 2019-03-03 LAB — MULTIPLE MYELOMA PANEL, SERUM
Albumin SerPl Elph-Mcnc: 3.7 g/dL (ref 2.9–4.4)
Albumin/Glob SerPl: 1.1 (ref 0.7–1.7)
Alpha 1: 0.2 g/dL (ref 0.0–0.4)
Alpha2 Glob SerPl Elph-Mcnc: 0.7 g/dL (ref 0.4–1.0)
B-Globulin SerPl Elph-Mcnc: 2 g/dL — ABNORMAL HIGH (ref 0.7–1.3)
Gamma Glob SerPl Elph-Mcnc: 0.6 g/dL (ref 0.4–1.8)
Globulin, Total: 3.5 g/dL (ref 2.2–3.9)
IgA: 1751 mg/dL — ABNORMAL HIGH (ref 87–352)
IgG (Immunoglobin G), Serum: 837 mg/dL (ref 586–1602)
IgM (Immunoglobulin M), Srm: 26 mg/dL (ref 26–217)
M Protein SerPl Elph-Mcnc: 1.4 g/dL — ABNORMAL HIGH
Total Protein ELP: 7.2 g/dL (ref 6.0–8.5)

## 2019-03-04 NOTE — Progress Notes (Signed)
[facility]   Telephone:(336) [phone] [phone]) [phone]   Clinic Follow up Note   Patient Care Team: Tisovec, Fransico Him, MD as PCP - General (Internal Medicine) Bo Merino, MD as Consulting Physician (Rheumatology)  Date of Service:  03/07/2019  CHIEF COMPLAINT: Follow-upanemia andsmoldering multiple myeloma   CURRENT THERAPY:  Surveillance and oral iron  INTERVAL HISTORY:  Hannah [name] is here for a follow up. She was last seen by me 6 months ago. She presents to the clinic alone.  She is clinically doing well and stable, with mild fatigue, stable chronic back pain, no new pain or other symptoms, her appetite and energy level has been normal.    REVIEW OF SYSTEMS:   Constitutional: Denies fevers, chills or abnormal weight loss Eyes: Denies blurriness of vision Ears, nose, mouth, throat, and face: Denies mucositis or sore throat Respiratory: Denies cough, dyspnea or wheezes Cardiovascular: Denies palpitation, chest discomfort or lower extremity swelling Gastrointestinal:  Denies nausea, heartburn or change in bowel habits Skin: Denies abnormal skin rashes Lymphatics: Denies new lymphadenopathy or easy bruising Neurological:Denies numbness, tingling or new weaknesses, (+) chronic back pain  Behavioral/Psych: Mood is stable, no new changes  All other systems were reviewed with the patient and are negative.  MEDICAL HISTORY:  Past Medical History:  Diagnosis Date  . Allergy   . Anemia   . Anxiety   . Arthritis   . Asthma   . Depression   . Hypertension     SURGICAL HISTORY: Past Surgical History:  Procedure Laterality Date  . CHOLECYSTECTOMY  1993  . COLONOSCOPY    . 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 TOTAL KNEE ARTHROPLASTY Right 01/28/2016   Procedure: RIGHT TOTAL KNEE ARTHROPLASTY;  Surgeon: [name], MD;  Location: [facility];  Service: Orthopedics;  Laterality: Right;  . TRIGGER FINGER RELEASE Right 06/18/2014   Procedure:  RIGHT LONG FINGER TRIGGER RELEASE;  Surgeon: [name], MD;  Location: [facility];  Service: Orthopedics;  Laterality: Right;  . TUBAL LIGATION    . VAGINAL HYSTERECTOMY Bilateral 11/04/2016   Procedure: HYSTERECTOMY VAGINAL with Bilateral Salpingectomy;  Surgeon: [name], MD;  Location: [facility];  Service: Gynecology;  Laterality: Bilateral;    I have reviewed the social history and family history with the patient and they are unchanged from previous note.  ALLERGIES:  is allergic to sulfur.  MEDICATIONS:  Current Outpatient Medications  Medication Sig Dispense Refill  . budesonide-formoterol (SYMBICORT) 160-4.5 MCG/ACT inhaler Inhale 2 puffs into the lungs daily as needed.    Marland Kitchen buPROPion (WELLBUTRIN XL) 150 MG 24 hr tablet 450 mg daily (150 mg + 300 mg) 30 tablet 0  . chlorthalidone (HYGROTON) 25 MG tablet TAKE 1 TABLET BY MOUTH EVERY DAY FOR HTN  3  . ferrous sulfate 325 (65 FE) MG tablet Take 325 mg by mouth 2 (two) times daily with a meal.    . ibuprofen (ADVIL,MOTRIN) 600 MG tablet 600 MG BY MOUTH EVERY 6 HOURS FOR 5 DAYS, THEN EVERY 6 HOURS AS NEEDED FOR PAIN.  DON'T TAKE MOBIC WHILE TAKING IBUPROFEN 60 tablet 0  . KLOR-CON M20 20 MEQ tablet TAKE 1 TABLET (20 MEQ TOTAL) BY MOUTH DAILY. TAKE 1 TAB TWICE DAILY FOR 3 DAYS THEN TAKE 1 TAB DAILY 33 tablet 0  . Multiple Vitamin (MULTIVITAMIN) tablet Take 1 tablet by mouth daily.    . naproxen sodium (ALEVE) 220 MG tablet Take 220 mg by mouth as needed.    Marland Kitchen  sertraline (ZOLOFT) 50 MG tablet Take 1 tablet (50 mg total) by mouth at bedtime. 90 tablet 0  . traZODone (DESYREL) 50 MG tablet Take 1 tablet (50 mg total) by mouth at bedtime. 90 tablet 0  . Vitamin D, Ergocalciferol, (DRISDOL) 50000 units CAPS capsule Take 50,000 Units by mouth every Monday.      No current facility-administered medications for this visit.     PHYSICAL EXAMINATION: ECOG PERFORMANCE STATUS: 1 - Symptomatic but completely ambulatory   Vitals:   03/07/19 0944 03/07/19 0948  BP: (!) 161/138 (!) 157/101  Pulse: 82   Resp: 17   Temp: 98.2 F (36.8 C)   SpO2: 94%    Filed Weights   03/07/19 0944  Weight: 241 lb 4.8 oz (109.5 kg)   GENERAL:alert, no distress and comfortable, obese female  SKIN: skin color, texture, turgor are normal, no rashes or significant lesions EYES: normal, Conjunctiva are pink and non-injected, sclera clear NECK: supple, thyroid normal size, non-tender, without nodularity LYMPH:  no palpable lymphadenopathy in the cervical, axillary  LUNGS: clear to auscultation and percussion with normal breathing effort HEART: regular rate & rhythm and no murmurs and no lower extremity edema ABDOMEN:abdomen soft, non-tender and normal bowel sounds Musculoskeletal:no cyanosis of digits and no clubbing  NEURO: alert & oriented x 3 with fluent speech, no focal motor/sensory deficits  LABORATORY DATA:  I have reviewed the data as listed CBC Latest Ref Rng & Units 02/28/2019 08/29/2018 06/01/2018  WBC 4.0 - 10.5 K/uL 7.5 7.7 7.7  Hemoglobin 12.0 - 15.0 g/dL 11.8(L) 12.2 11.6(L)  Hematocrit 36.0 - 46.0 % 35.9(L) 37.2 35.7(L)  Platelets 150 - 400 K/uL 215 216 208     CMP Latest Ref Rng & Units 08/29/2018 06/01/2018 02/21/2018  Glucose 70 - 99 mg/dL 107(H) 102(H) 97  BUN 6 - 20 mg/dL _0 Creatinine 0.44 - 1.00 mg/dL 0.91 0.82 0.83  Sodium 135 - 145 mmol/L 139 142 143  Potassium 3.5 - 5.1 mmol/L 3.2(L) 3.6 3.4(L)  Chloride 98 - 111 mmol/L 100 103 101  CO2 22 - 32 mmol/L 27 29 34(H)  Calcium 8.9 - 10.3 mg/dL 8.9 9.1 9.1  Total Protein 6.5 - 8.1 g/dL 8.2(H) 7.8 7.7  Total Bilirubin 0.3 - 1.2 mg/dL 0.3 0.3 0.5  Alkaline Phos 38 - 126 U/L 83 81 72  AST 15 - 41 U/L 14(L) 15 14(L)  ALT 0 - 44 U/L _1 RADIOGRAPHIC STUDIES: I have personally reviewed the radiological images as listed and agreed with the findings in the report. No results found.   ASSESSMENT & PLAN:  Hannah [name] is a 58 y.o.  female with   1. Smoldering multiple myeloma, IgA Kappa type -Wepreviouslyreviewed her medical chart including labs and imaging in detail with the patient.  -Herpreviousoutside test showed elevated M protein 1.2g/dl, elevated IgA level, light chain levels were within normal limits and ratio was slightly increased.She does have mild anemia, likely related to iron deficiency, no renal failure, hypercalcemia, neuropathy, and her previous surgery was negative. -Discussed her bone marrow biopsy findings from 10/2017. She has increased plasma (6% on aspirate, 20% by CD 138). I previouslydiscussed with pathologist Dr. Gari [facility], the increased plasma cells in bone marrow is likely between 10 to 20%, and would favor smoldering multiple myeloma than MGUS -Also patient does not meet CRAB for MM, she does have low back and hip pain. Ipreviouslyobtaineda pelvic and lumbar MRI which was negative for  myeloma involvement -She is clinically stable, no change of her back pain for knee pain.  Lab reviewed, mild anemia stable, recent outside CMP was unremarkable, and multiple myeloma panel from last week showed a stable M protein 1.4, IgA and light chain level, concerning for disease progression. -We again discussed she is at high risk for developing multiple myeloma, I recommend continue monitoring, no indication for treatment at this point. -will continue monitoring, I will see her back in 8 months. -We will continue bone survey yearly   2. Anemia,hemoglobin C trait -She appears to have chronic mild microcytic anemia, likely a component of iron deficiency.  -Hemoglobin electrophoresisshowed increase in Hg C, consistent with Hg C trait. I discussed the results with her and informed her that her children are at risk ofhemoglobin C trait, and I encouraged him to be tested. -Anemia overall mild and stable.  Continue oral iron daily.   3. HTN -controlled on chlorthalidone.  -followed by PCP   4. OA -She has  imaging evidence consistent with OA in multiple joints. Pain currently controlled with NSAIDs and tylenol PRN.  -she was evaluated by rheumatology, will f/u there on PRN basis   5. Anxiety, depression -under care of [facility] provider, stable and tolerating zoloft   6. Obesity -She is trying to lose weight and exercise  -I previously offered to refer her to a weight loss program, but she states that she is trying on her own with diet and exercise.    PLAN:  -Labs reviewed, stable -Bone survey in the next few weeks, I will call her with results -Labs every 4 months, follow-up in 8 months   No problem-specific Assessment & Plan notes found for this encounter.   Orders Placed This Encounter  Procedures  . DG Bone Survey Met    Standing Status:   Future    Standing Expiration Date:   05/06/2020    Order Specific Question:   Reason for Exam (SYMPTOM  OR DIAGNOSIS REQUIRED)    Answer:   screening bony lesion, rule out MM    Order Specific Question:   Is patient pregnant?    Answer:   No    Order Specific Question:   Preferred imaging location?    Answer:   [facility]    Order Specific Question:   Radiology Contrast Protocol - do NOT remove file path    Answer:   [email or web address]   All questions were answered. The patient knows to call the clinic with any problems, questions or concerns. No barriers to learning was detected.     Truitt Merle, MD 03/07/2019   I, Joslyn Devon, am acting as scribe for Truitt Merle, MD.   I have reviewed the above documentation for accuracy and completeness, and I agree with the above.

## 2019-03-07 ENCOUNTER — Inpatient Hospital Stay (HOSPITAL_BASED_OUTPATIENT_CLINIC_OR_DEPARTMENT_OTHER): Payer: BC Managed Care – PPO | Admitting: Hematology

## 2019-03-07 ENCOUNTER — Encounter: Payer: Self-pay | Admitting: Hematology

## 2019-03-07 ENCOUNTER — Other Ambulatory Visit: Payer: Self-pay

## 2019-03-07 VITALS — BP 157/101 | HR 82 | Temp 98.2°F | Resp 17 | Ht 66.0 in | Wt 241.3 lb

## 2019-03-07 DIAGNOSIS — C9000 Multiple myeloma not having achieved remission: Secondary | ICD-10-CM | POA: Diagnosis not present

## 2019-03-07 DIAGNOSIS — F419 Anxiety disorder, unspecified: Secondary | ICD-10-CM | POA: Diagnosis not present

## 2019-03-07 DIAGNOSIS — D509 Iron deficiency anemia, unspecified: Secondary | ICD-10-CM | POA: Diagnosis not present

## 2019-03-07 DIAGNOSIS — M549 Dorsalgia, unspecified: Secondary | ICD-10-CM | POA: Diagnosis not present

## 2019-03-07 DIAGNOSIS — D582 Other hemoglobinopathies: Secondary | ICD-10-CM | POA: Diagnosis not present

## 2019-03-07 DIAGNOSIS — D472 Monoclonal gammopathy: Secondary | ICD-10-CM

## 2019-03-07 DIAGNOSIS — E669 Obesity, unspecified: Secondary | ICD-10-CM | POA: Diagnosis not present

## 2019-03-07 DIAGNOSIS — Z791 Long term (current) use of non-steroidal anti-inflammatories (NSAID): Secondary | ICD-10-CM | POA: Diagnosis not present

## 2019-03-07 DIAGNOSIS — R5383 Other fatigue: Secondary | ICD-10-CM | POA: Diagnosis not present

## 2019-03-07 DIAGNOSIS — Z7951 Long term (current) use of inhaled steroids: Secondary | ICD-10-CM | POA: Diagnosis not present

## 2019-03-07 DIAGNOSIS — F329 Major depressive disorder, single episode, unspecified: Secondary | ICD-10-CM | POA: Diagnosis not present

## 2019-03-07 DIAGNOSIS — J45909 Unspecified asthma, uncomplicated: Secondary | ICD-10-CM | POA: Diagnosis not present

## 2019-03-07 DIAGNOSIS — Z79899 Other long term (current) drug therapy: Secondary | ICD-10-CM | POA: Diagnosis not present

## 2019-03-07 DIAGNOSIS — G8929 Other chronic pain: Secondary | ICD-10-CM | POA: Diagnosis not present

## 2019-03-07 DIAGNOSIS — I10 Essential (primary) hypertension: Secondary | ICD-10-CM | POA: Diagnosis not present

## 2019-03-08 ENCOUNTER — Telehealth: Payer: Self-pay | Admitting: Hematology

## 2019-03-08 NOTE — Telephone Encounter (Signed)
Scheduled appt per 10/8 los.  Sent a staff message to get a calendar mailed out.

## 2019-04-01 DIAGNOSIS — E559 Vitamin D deficiency, unspecified: Secondary | ICD-10-CM | POA: Diagnosis not present

## 2019-04-01 DIAGNOSIS — Z1231 Encounter for screening mammogram for malignant neoplasm of breast: Secondary | ICD-10-CM | POA: Diagnosis not present

## 2019-04-01 DIAGNOSIS — B009 Herpesviral infection, unspecified: Secondary | ICD-10-CM | POA: Diagnosis not present

## 2019-04-01 DIAGNOSIS — Z01411 Encounter for gynecological examination (general) (routine) with abnormal findings: Secondary | ICD-10-CM | POA: Diagnosis not present

## 2019-04-01 DIAGNOSIS — B373 Candidiasis of vulva and vagina: Secondary | ICD-10-CM | POA: Diagnosis not present

## 2019-04-01 DIAGNOSIS — Z6841 Body Mass Index (BMI) 40.0 and over, adult: Secondary | ICD-10-CM | POA: Diagnosis not present

## 2019-04-09 DIAGNOSIS — B373 Candidiasis of vulva and vagina: Secondary | ICD-10-CM | POA: Diagnosis not present

## 2019-04-09 DIAGNOSIS — B009 Herpesviral infection, unspecified: Secondary | ICD-10-CM | POA: Diagnosis not present

## 2019-04-09 DIAGNOSIS — N9089 Other specified noninflammatory disorders of vulva and perineum: Secondary | ICD-10-CM | POA: Diagnosis not present

## 2019-04-10 DIAGNOSIS — J4540 Moderate persistent asthma, uncomplicated: Secondary | ICD-10-CM | POA: Diagnosis not present

## 2019-04-24 ENCOUNTER — Telehealth: Payer: Self-pay | Admitting: Physical Medicine and Rehabilitation

## 2019-04-24 ENCOUNTER — Encounter: Payer: BC Managed Care – PPO | Admitting: Physical Medicine and Rehabilitation

## 2019-04-24 NOTE — Telephone Encounter (Signed)
Called pt and lvm #1 to get appt r/s

## 2019-04-24 NOTE — Telephone Encounter (Signed)
Rescheduled for 12/9.

## 2019-04-24 NOTE — Telephone Encounter (Signed)
Patient lmom states she's having car trouble and would like to reschedule her appt for 04/24/19 @ 3:00pm with Dr [name]

## 2019-05-08 ENCOUNTER — Encounter: Payer: Self-pay | Admitting: Physical Medicine and Rehabilitation

## 2019-05-08 ENCOUNTER — Other Ambulatory Visit: Payer: Self-pay

## 2019-05-08 ENCOUNTER — Ambulatory Visit (INDEPENDENT_AMBULATORY_CARE_PROVIDER_SITE_OTHER): Payer: BC Managed Care – PPO | Admitting: Physical Medicine and Rehabilitation

## 2019-05-08 ENCOUNTER — Ambulatory Visit: Payer: Self-pay

## 2019-05-08 VITALS — BP 154/98 | HR 87

## 2019-05-08 DIAGNOSIS — M47816 Spondylosis without myelopathy or radiculopathy, lumbar region: Secondary | ICD-10-CM

## 2019-05-08 MED ORDER — METHYLPREDNISOLONE ACETATE 80 MG/ML IJ SUSP
80.0000 mg | Freq: Once | INTRAMUSCULAR | Status: AC
  Administered 2019-05-08: 80 mg

## 2019-05-08 NOTE — Progress Notes (Signed)
Pt states pain across the lower back. Pt states pain started more than 6 months ago. Pt states bending and sitting makes pain worse. Aleve helps with pain.   .Numeric Pain Rating Scale and Functional Assessment Average Pain 8   In the last MONTH (on 0-10 scale) has pain interfered with the following?  1. General activity like being  able to carry out your everyday physical activities such as walking, climbing stairs, carrying groceries, or moving a chair?  Rating(8)   -Driver(calling husband do see if he can drive her home), -BT, -Dye Allergies.

## 2019-07-05 ENCOUNTER — Telehealth: Payer: Self-pay | Admitting: Hematology

## 2019-07-05 NOTE — Telephone Encounter (Signed)
Returned patient's phone call regarding rescheduling an appointment, left a voicemail. 

## 2019-07-08 ENCOUNTER — Inpatient Hospital Stay: Payer: BC Managed Care – PPO

## 2019-07-11 ENCOUNTER — Telehealth: Payer: Self-pay | Admitting: Hematology

## 2019-07-11 NOTE — Telephone Encounter (Signed)
Rescheduled per 2/10 sch msg, pt req. Called and left a msg. Mailing printout

## 2019-07-12 ENCOUNTER — Telehealth: Payer: Self-pay | Admitting: Orthopaedic Surgery

## 2019-07-12 NOTE — Telephone Encounter (Signed)
Only for two years after replacement.  Looks like it has been longer than that so she does not need them

## 2019-07-12 NOTE — Telephone Encounter (Signed)
Patient called and wanted to know if she needs an antibiotic for teeth cleaning because she had knee replacement surgery on 01/28/16.  Please call patient to advise.  [phone]

## 2019-07-12 NOTE — Telephone Encounter (Signed)
Called patient no answer. LMOM with details. '

## 2019-07-22 ENCOUNTER — Inpatient Hospital Stay: Payer: BC Managed Care – PPO | Attending: Hematology

## 2019-07-30 DIAGNOSIS — Z79899 Other long term (current) drug therapy: Secondary | ICD-10-CM | POA: Diagnosis not present

## 2019-07-30 DIAGNOSIS — I10 Essential (primary) hypertension: Secondary | ICD-10-CM | POA: Diagnosis not present

## 2019-07-30 DIAGNOSIS — Z0000 Encounter for general adult medical examination without abnormal findings: Secondary | ICD-10-CM | POA: Diagnosis not present

## 2019-08-05 DIAGNOSIS — I10 Essential (primary) hypertension: Secondary | ICD-10-CM | POA: Diagnosis not present

## 2019-08-05 DIAGNOSIS — R82998 Other abnormal findings in urine: Secondary | ICD-10-CM | POA: Diagnosis not present

## 2019-08-06 DIAGNOSIS — I10 Essential (primary) hypertension: Secondary | ICD-10-CM | POA: Diagnosis not present

## 2019-08-06 DIAGNOSIS — Z0000 Encounter for general adult medical examination without abnormal findings: Secondary | ICD-10-CM | POA: Diagnosis not present

## 2019-08-06 DIAGNOSIS — C9000 Multiple myeloma not having achieved remission: Secondary | ICD-10-CM | POA: Diagnosis not present

## 2019-08-06 DIAGNOSIS — D582 Other hemoglobinopathies: Secondary | ICD-10-CM | POA: Diagnosis not present

## 2019-08-06 DIAGNOSIS — Z1331 Encounter for screening for depression: Secondary | ICD-10-CM | POA: Diagnosis not present

## 2019-08-06 DIAGNOSIS — D509 Iron deficiency anemia, unspecified: Secondary | ICD-10-CM | POA: Diagnosis not present

## 2019-08-16 ENCOUNTER — Other Ambulatory Visit: Payer: Self-pay

## 2019-08-16 ENCOUNTER — Inpatient Hospital Stay: Payer: BC Managed Care – PPO | Attending: Hematology

## 2019-08-16 DIAGNOSIS — D472 Monoclonal gammopathy: Secondary | ICD-10-CM

## 2019-08-16 DIAGNOSIS — C9000 Multiple myeloma not having achieved remission: Secondary | ICD-10-CM | POA: Diagnosis not present

## 2019-08-16 LAB — CMP (CANCER CENTER ONLY)
ALT: 9 U/L (ref 0–44)
AST: 13 U/L — ABNORMAL LOW (ref 15–41)
Albumin: 3.2 g/dL — ABNORMAL LOW (ref 3.5–5.0)
Alkaline Phosphatase: 73 U/L (ref 38–126)
Anion gap: 10 (ref 5–15)
BUN: 11 mg/dL (ref 6–20)
CO2: 26 mmol/L (ref 22–32)
Calcium: 8.3 mg/dL — ABNORMAL LOW (ref 8.9–10.3)
Chloride: 105 mmol/L (ref 98–111)
Creatinine: 0.79 mg/dL (ref 0.44–1.00)
GFR, Est AFR Am: >60 mL/min (ref >60)
GFR, Estimated: >60 mL/min (ref >60)
Glucose, Bld: 104 mg/dL — ABNORMAL HIGH (ref 70–99)
Potassium: 3.4 mmol/L — ABNORMAL LOW (ref 3.5–5.1)
Sodium: 141 mmol/L (ref 135–145)
Total Bilirubin: 0.3 mg/dL (ref 0.3–1.2)
Total Protein: 7.3 g/dL (ref 6.5–8.1)

## 2019-08-16 LAB — CBC WITH DIFFERENTIAL (CANCER CENTER ONLY)
Abs Immature Granulocytes: 0.02 10*3/uL (ref 0.00–0.07)
Basophils Absolute: 0 10*3/uL (ref 0.0–0.1)
Basophils Relative: 0 %
Eosinophils Absolute: 0.1 10*3/uL (ref 0.0–0.5)
Eosinophils Relative: 1 %
HCT: 34.5 % — ABNORMAL LOW (ref 36.0–46.0)
Hemoglobin: 11.2 g/dL — ABNORMAL LOW (ref 12.0–15.0)
Immature Granulocytes: 0 %
Lymphocytes Relative: 35 %
Lymphs Abs: 2.4 10*3/uL (ref 0.7–4.0)
MCH: 24.3 pg — ABNORMAL LOW (ref 26.0–34.0)
MCHC: 32.5 g/dL (ref 30.0–36.0)
MCV: 74.8 fL — ABNORMAL LOW (ref 80.0–100.0)
Monocytes Absolute: 0.4 10*3/uL (ref 0.1–1.0)
Monocytes Relative: 6 %
Neutro Abs: 4 10*3/uL (ref 1.7–7.7)
Neutrophils Relative %: 58 %
Platelet Count: 196 10*3/uL (ref 150–400)
RBC: 4.61 MIL/uL (ref 3.87–5.11)
RDW: 14.4 % (ref 11.5–15.5)
WBC Count: 7 10*3/uL (ref 4.0–10.5)
nRBC: 0 % (ref 0.0–0.2)

## 2019-08-19 LAB — KAPPA/LAMBDA LIGHT CHAINS
Kappa free light chain: 64 mg/L — ABNORMAL HIGH (ref 3.3–19.4)
Kappa, lambda light chain ratio: 12.8 — ABNORMAL HIGH (ref 0.26–1.65)
Lambda free light chains: 5 mg/L — ABNORMAL LOW (ref 5.7–26.3)

## 2019-08-20 DIAGNOSIS — Z1212 Encounter for screening for malignant neoplasm of rectum: Secondary | ICD-10-CM | POA: Diagnosis not present

## 2019-08-20 LAB — MULTIPLE MYELOMA PANEL, SERUM
Albumin SerPl Elph-Mcnc: 3.3 g/dL (ref 2.9–4.4)
Albumin/Glob SerPl: 1 (ref 0.7–1.7)
Alpha 1: 0.2 g/dL (ref 0.0–0.4)
Alpha2 Glob SerPl Elph-Mcnc: 0.5 g/dL (ref 0.4–1.0)
B-Globulin SerPl Elph-Mcnc: 2.1 g/dL — ABNORMAL HIGH (ref 0.7–1.3)
Gamma Glob SerPl Elph-Mcnc: 0.7 g/dL (ref 0.4–1.8)
Globulin, Total: 3.6 g/dL (ref 2.2–3.9)
IgA: 2089 mg/dL — ABNORMAL HIGH (ref 87–352)
IgG (Immunoglobin G), Serum: 681 mg/dL (ref 586–1602)
IgM (Immunoglobulin M), Srm: 25 mg/dL — ABNORMAL LOW (ref 26–217)
M Protein SerPl Elph-Mcnc: 1.5 g/dL — ABNORMAL HIGH
Total Protein ELP: 6.9 g/dL (ref 6.0–8.5)

## 2019-08-21 ENCOUNTER — Ambulatory Visit: Payer: Self-pay

## 2019-08-21 ENCOUNTER — Other Ambulatory Visit: Payer: Self-pay

## 2019-08-21 ENCOUNTER — Ambulatory Visit (INDEPENDENT_AMBULATORY_CARE_PROVIDER_SITE_OTHER): Payer: BC Managed Care – PPO | Admitting: Orthopedic Surgery

## 2019-08-21 DIAGNOSIS — M19011 Primary osteoarthritis, right shoulder: Secondary | ICD-10-CM

## 2019-08-21 DIAGNOSIS — R29898 Other symptoms and signs involving the musculoskeletal system: Secondary | ICD-10-CM

## 2019-08-21 DIAGNOSIS — M25511 Pain in right shoulder: Secondary | ICD-10-CM | POA: Diagnosis not present

## 2019-08-21 DIAGNOSIS — M542 Cervicalgia: Secondary | ICD-10-CM | POA: Diagnosis not present

## 2019-08-21 NOTE — Progress Notes (Signed)
Subjective: Patient is here for ultrasound-guided intra-articular right glenohumeral injection.  She has [place] DJD.  Objective:  Decreased overhead reach.  Procedure: Ultrasound-guided right glenohumeral injection: After sterile prep with Betadine, injected 8 cc 1% lidocaine without epinephrine and 40 mg methylprednisolone using a 22-gauge spinal needle, passing the needle through approach into the glenohumeral joint.  Injectate seen filling joint capsule.  A flash of clear yellow synovial fluid was obtained prior to injection giving extra confirmation that this was intra-articular.  She had very good immediate relief.

## 2019-08-23 ENCOUNTER — Encounter: Payer: Self-pay | Admitting: Orthopedic Surgery

## 2019-08-23 NOTE — Progress Notes (Signed)
Office Visit Note   Patient: Hannah [name]           Date of Birth: [birth year]           MRN: [id number] Visit Date: 08/21/2019 Requested by: [name], MD [address] [place],  [place] [address] PCP: Osborne Casco Fransico Him, MD  Subjective: Chief Complaint  Patient presents with  . Right Shoulder - Pain    HPI: Hannah [name] is a 59 y.o. female who presents to the office complaining of right shoulder pain.  Patient has a history of right shoulder arthritis.  She notes that over the last year her right shoulder pain has been getting worse.  She localizes majority of her pain to the right shoulder with radiation to her right elbow.  Occasionally she has radiating pain past the right elbow.  She notes a catching sensation in her shoulder.  She has been dropping things and an increased rate and notes right hand weakness and numbness/tingling.  She also notes associated neck pain.  She has been taking Aleve and Tylenol which helps.  Mobic provided no relief.  She has no history of shoulder or neck surgery.  She does have a history of ESI's in the lumbar spine.  She has no recent C-spine MRI.  She notes pain occasionally radiates down into her hand..                ROS:  All systems reviewed are negative as they relate to the chief complaint within the history of present illness.  Patient denies fevers or chills.  Assessment & Plan: Visit Diagnoses:  1. Right shoulder pain, unspecified chronicity   2. Neck pain     Plan: Patient is a 59 year old female who presents complaining of right shoulder pain.  She has a history of right shoulder arthritis.  She notes worsening pain over the last year and she has also developed radicular symptoms with right hand weakness and numbness/tingling on occasion.  She does have a history of having lumbar spine ESI's with good relief.  Ordered MRI of the cervical spine to evaluate right radiculopathy.  Additionally with her history of right shoulder  arthritis, referred her to Dr. Junius Roads for a ultrasound-guided glenohumeral injection.  She will follow-up after MRI to review results.  Follow-Up Instructions: No follow-ups on file.   Orders:  Orders Placed This Encounter  Procedures  . XR Shoulder Right  . XR Cervical Spine 2 or 3 views  . US Guided Needle Placement  . MR Cervical Spine w/o contrast   No orders of the defined types were placed in this encounter.     Procedures: No procedures performed   Clinical Data: No additional findings.  Objective: Vital Signs: LMP 10/22/2016 (Exact Date)   Physical Exam:  Constitutional: Patient appears well-developed HEENT:  Head: Normocephalic Eyes:EOM are normal Neck: Normal range of motion Cardiovascular: Normal rate Pulmonary/chest: Effort normal Neurologic: Patient is alert Skin: Skin is warm Psychiatric: Patient has normal mood and affect  Ortho Exam:  Right shoulder Exam Decreased passive range of motion of the right shoulder with worsened pain at the terminal ends of range of motion. No TTP over the [facility] joint or bicipital groove Good subscapularis, supraspinatus, and infraspinatus strength 5/5 forearm pronation/supination, and bicep strength.   4/5 motor strength with right grip strength and tricep extension strength compared with contralateral side Mild pain with cervical range of motion. Sensation intact through all dermatomes of the bilateral upper extremities.  Specialty Comments:  No specialty comments available.  Imaging: No results found.   PMFS History: Patient Active Problem List   Diagnosis Date Noted  . Hemoglobin C trait ([facility]) 02/27/2018  . Major depression, recurrent ([place]) 01/12/2018  . Chronic right shoulder pain 12/20/2017  . Smoldering multiple myeloma ([facility]) 11/28/2017  . MDD (major depressive disorder), recurrent episode, moderate ([facility]) 11/04/2017  . MGUS (monoclonal gammopathy of unknown significance) 10/24/2017  . Primary  osteoarthritis of both hands 09/26/2017  . Primary osteoarthritis of right shoulder 09/26/2017  . Primary osteoarthritis of both hips 09/26/2017  . Status post total knee replacement, right 09/26/2017  . Primary osteoarthritis of both feet 09/26/2017  . History of asthma 09/26/2017  . Primary osteoarthritis of left knee 04/18/2017  . Menorrhagia 11/04/2016  . Fibroid tumor 11/04/2016  . Adenomyosis 11/04/2016  . Hypertension, essential 11/04/2016  . 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 Allergy   . Anemia   . Anxiety   . Arthritis   . Asthma   . Depression   . Hypertension     Family History  Problem Relation Age of Onset  . Cancer Mother        uterine   . Asthma Mother   . Arthritis Daughter   . Depression Maternal Uncle   . Colon cancer Neg Hx     Past Surgical History:  Procedure Laterality Date  . CHOLECYSTECTOMY  1993  . COLONOSCOPY    . 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 TOTAL KNEE ARTHROPLASTY Right 01/28/2016   Procedure: RIGHT TOTAL KNEE ARTHROPLASTY;  Surgeon: [name], MD;  Location: [facility];  Service: Orthopedics;  Laterality: Right;  . TRIGGER FINGER RELEASE Right 06/18/2014   Procedure: RIGHT LONG FINGER TRIGGER RELEASE;  Surgeon: [name], MD;  Location: [facility];  Service: Orthopedics;  Laterality: Right;  . TUBAL LIGATION    . VAGINAL HYSTERECTOMY Bilateral 11/04/2016   Procedure: HYSTERECTOMY VAGINAL with Bilateral Salpingectomy;  Surgeon: [name], MD;  Location: [facility];  Service: Gynecology;  Laterality: Bilateral;   Social History   Occupational History  . Not on file  Tobacco Use  . Smoking status: Never Smoker  . Smokeless tobacco: Never Used  Substance and Sexual Activity  . Alcohol use: No    Alcohol/week: 0.0 standard drinks  . Drug use: No  . Sexual activity: Yes    Partners: Male    Birth control/protection: Surgical

## 2019-08-27 ENCOUNTER — Telehealth: Payer: Self-pay | Admitting: *Deleted

## 2019-08-27 NOTE — Telephone Encounter (Signed)
-----   Message from Truitt Merle, MD sent at 08/24/2019 12:17 PM EDT ----- Please let pt know her lab results, M-protein stable, but light chain level slightly increased, other lab are stable, continue monitoring for now.   Truitt Merle  08/24/2019

## 2019-08-27 NOTE — Telephone Encounter (Signed)
Left message for pt to return call to discuss labs.  

## 2019-08-28 ENCOUNTER — Telehealth: Payer: Self-pay

## 2019-08-28 NOTE — Telephone Encounter (Signed)
TC to pt per Dr Burr Medico to let her know her lab results. I let her know that her  M-protein stable, but light chain level slightly increased, other lab are stable. Pt verbalized understanding and stated that she would be looking on Mychart to  See her labs on there also. No further problems or concerns at this time.

## 2019-09-12 ENCOUNTER — Ambulatory Visit
Admission: RE | Admit: 2019-09-12 | Discharge: 2019-09-12 | Disposition: A | Discharge status: Home / Self Care | Payer: BC Managed Care – PPO | Source: Ambulatory Visit | Attending: Orthopedic Surgery | Admitting: Orthopedic Surgery

## 2019-09-12 ENCOUNTER — Other Ambulatory Visit: Payer: Self-pay

## 2019-09-12 DIAGNOSIS — M542 Cervicalgia: Secondary | ICD-10-CM

## 2019-09-12 DIAGNOSIS — M4802 Spinal stenosis, cervical region: Secondary | ICD-10-CM | POA: Diagnosis not present

## 2019-09-18 ENCOUNTER — Ambulatory Visit (INDEPENDENT_AMBULATORY_CARE_PROVIDER_SITE_OTHER): Payer: BC Managed Care – PPO | Admitting: Orthopedic Surgery

## 2019-09-18 ENCOUNTER — Other Ambulatory Visit: Payer: Self-pay

## 2019-09-18 DIAGNOSIS — M19011 Primary osteoarthritis, right shoulder: Secondary | ICD-10-CM | POA: Diagnosis not present

## 2019-09-22 NOTE — Progress Notes (Signed)
Office Visit Note   Patient: Hannah [name]           Date of Birth: 1961/01/29           MRN: [id number] Visit Date: 09/18/2019 Requested by: [name], MD [address] [place],  [place] [address] PCP: Osborne Casco Fransico Him, MD  Subjective: Chief Complaint  Patient presents with  . Follow-up    HPI: Hannah [name] is a 59 y.o. female who presents to the office complaining of shoulder pain.  She returns for C-spine MRI review.  MRI of the cervical spine revealed lower cervical disc and endplate degeneration without significant spinal stenosis.  She does have moderate to severe left C7 neural foraminal stenosis.  She complains of right-sided symptoms.  She had a right glenohumeral injection previously that gave relief.  3 weeks.  Numbness and tingling resolved with the injection.  Pain was much improved with injection.  She has had several years of pain.  She has a history of moderate to severe right glenohumeral joint arthritis.  She has been taking Aleve and Tylenol for pain.  Mobic provides no relief..                ROS:  All systems reviewed are negative as they relate to the chief complaint within the history of present illness.  Patient denies fevers or chills.  Assessment & Plan: Visit Diagnoses:  1. Primary osteoarthritis of right shoulder     Plan: Patient is a 59 year old female female who presents complaint of right shoulder pain.  She has numbness and tingling that goes down her right arm.  She also notes some subjective weakness of the shoulder.  She has a history of right glenohumeral arthritis and received an injection for this gave relief for 3 weeks.  MRI of the cervical spine shows left-sided neuroforaminal stenosis but no significant findings that would cause right-sided symptoms.  Plan for patient to follow-up as needed for right shoulder injections periodically.  Patient agreed this plan and will follow as needed.  Follow-Up Instructions: No follow-ups on file.    Orders:  No orders of the defined types were placed in this encounter.  No orders of the defined types were placed in this encounter.     Procedures: No procedures performed   Clinical Data: No additional findings.  Objective: Vital Signs: LMP 10/22/2016 (Exact Date)   Physical Exam:  Constitutional: Patient appears well-developed HEENT:  Head: Normocephalic Eyes:EOM are normal Neck: Normal range of motion Cardiovascular: Normal rate Pulmonary/chest: Effort normal Neurologic: Patient is alert Skin: Skin is warm Psychiatric: Patient has normal mood and affect  Ortho Exam:  Right shoulder Exam Decreased forward flexion and abduction compared with contralateral side. Decrease external rotation relative to the contralateral side. No significant tenderness palpation of the AC joint.  Mild to moderate tenderness to palpation over the bicipital groove. Good subscapularis, supraspinatus, and infraspinatus strength. Negative Hawkins impingement 5/5 grip strength, forearm pronation/supination, and bicep strength.  4/5 strength of the tricep  Specialty Comments:  No specialty comments available.  Imaging: No results found.   PMFS History: Patient Active Problem List   Diagnosis Date Noted  . 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 Major depression, recurrent ([facility]) 01/12/2018  . Chronic right shoulder pain 12/20/2017  . Smoldering multiple myeloma ([facility]) 11/28/2017  . MDD (major depressive disorder), recurrent episode, moderate ([facility]) 11/04/2017  . MGUS (monoclonal gammopathy of unknown significance) 10/24/2017  . Primary osteoarthritis of both hands 09/26/2017  .  Primary osteoarthritis of right shoulder 09/26/2017  . Primary osteoarthritis of both hips 09/26/2017  . Status post total knee replacement, right 09/26/2017  . Primary osteoarthritis of both feet 09/26/2017  . History of asthma 09/26/2017  . Primary osteoarthritis of left knee 04/18/2017  . Menorrhagia  11/04/2016  . Fibroid tumor 11/04/2016  . Adenomyosis 11/04/2016  . Hypertension, essential 11/04/2016  . 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 Allergy   . Anemia   . Anxiety   . Arthritis   . Asthma   . Depression   . Hypertension     Family History  Problem Relation Age of Onset  . Cancer Mother        uterine   . Asthma Mother   . Arthritis Daughter   . Depression Maternal Uncle   . Colon cancer Neg Hx     Past Surgical History:  Procedure Laterality Date  . CHOLECYSTECTOMY  1993  . COLONOSCOPY    . 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 TOTAL KNEE ARTHROPLASTY Right 01/28/2016   Procedure: RIGHT TOTAL KNEE ARTHROPLASTY;  Surgeon: [name], MD;  Location: [facility];  Service: Orthopedics;  Laterality: Right;  . TRIGGER FINGER RELEASE Right 06/18/2014   Procedure: RIGHT LONG FINGER TRIGGER RELEASE;  Surgeon: [name], MD;  Location: [facility];  Service: Orthopedics;  Laterality: Right;  . TUBAL LIGATION    . VAGINAL HYSTERECTOMY Bilateral 11/04/2016   Procedure: HYSTERECTOMY VAGINAL with Bilateral Salpingectomy;  Surgeon: [name], MD;  Location: [facility];  Service: Gynecology;  Laterality: Bilateral;   Social History   Occupational History  . Not on file  Tobacco Use  . Smoking status: Never Smoker  . Smokeless tobacco: Never Used  Substance and Sexual Activity  . Alcohol use: No    Alcohol/week: 0.0 standard drinks  . Drug use: No  . Sexual activity: Yes    Partners: Male    Birth control/protection: Surgical

## 2019-09-25 ENCOUNTER — Encounter: Payer: Self-pay | Admitting: Orthopedic Surgery

## 2019-10-29 ENCOUNTER — Inpatient Hospital Stay: Payer: BC Managed Care – PPO | Attending: Hematology

## 2019-10-29 ENCOUNTER — Other Ambulatory Visit: Payer: Self-pay

## 2019-10-29 DIAGNOSIS — F329 Major depressive disorder, single episode, unspecified: Secondary | ICD-10-CM | POA: Insufficient documentation

## 2019-10-29 DIAGNOSIS — R768 Other specified abnormal immunological findings in serum: Secondary | ICD-10-CM | POA: Diagnosis not present

## 2019-10-29 DIAGNOSIS — C9000 Multiple myeloma not having achieved remission: Secondary | ICD-10-CM | POA: Insufficient documentation

## 2019-10-29 DIAGNOSIS — M1990 Unspecified osteoarthritis, unspecified site: Secondary | ICD-10-CM | POA: Insufficient documentation

## 2019-10-29 DIAGNOSIS — F419 Anxiety disorder, unspecified: Secondary | ICD-10-CM | POA: Diagnosis not present

## 2019-10-29 DIAGNOSIS — M25511 Pain in right shoulder: Secondary | ICD-10-CM | POA: Insufficient documentation

## 2019-10-29 DIAGNOSIS — D509 Iron deficiency anemia, unspecified: Secondary | ICD-10-CM | POA: Insufficient documentation

## 2019-10-29 DIAGNOSIS — E669 Obesity, unspecified: Secondary | ICD-10-CM | POA: Insufficient documentation

## 2019-10-29 DIAGNOSIS — I10 Essential (primary) hypertension: Secondary | ICD-10-CM | POA: Diagnosis not present

## 2019-10-29 DIAGNOSIS — D472 Monoclonal gammopathy: Secondary | ICD-10-CM

## 2019-10-29 LAB — CMP (CANCER CENTER ONLY)
ALT: 13 U/L (ref 0–44)
AST: 13 U/L — ABNORMAL LOW (ref 15–41)
Albumin: 3.5 g/dL (ref 3.5–5.0)
Alkaline Phosphatase: 78 U/L (ref 38–126)
Anion gap: 11 (ref 5–15)
BUN: 12 mg/dL (ref 6–20)
CO2: 30 mmol/L (ref 22–32)
Calcium: 9.1 mg/dL (ref 8.9–10.3)
Chloride: 100 mmol/L (ref 98–111)
Creatinine: 0.82 mg/dL (ref 0.44–1.00)
GFR, Est AFR Am: >60 mL/min (ref >60)
GFR, Estimated: >60 mL/min (ref >60)
Glucose, Bld: 91 mg/dL (ref 70–99)
Potassium: 3.5 mmol/L (ref 3.5–5.1)
Sodium: 141 mmol/L (ref 135–145)
Total Bilirubin: 0.4 mg/dL (ref 0.3–1.2)
Total Protein: 8.3 g/dL — ABNORMAL HIGH (ref 6.5–8.1)

## 2019-10-29 LAB — CBC WITH DIFFERENTIAL (CANCER CENTER ONLY)
Abs Immature Granulocytes: 0.02 10*3/uL (ref 0.00–0.07)
Basophils Absolute: 0 10*3/uL (ref 0.0–0.1)
Basophils Relative: 0 %
Eosinophils Absolute: 0.1 10*3/uL (ref 0.0–0.5)
Eosinophils Relative: 1 %
HCT: 35.7 % — ABNORMAL LOW (ref 36.0–46.0)
Hemoglobin: 11.5 g/dL — ABNORMAL LOW (ref 12.0–15.0)
Immature Granulocytes: 0 %
Lymphocytes Relative: 33 %
Lymphs Abs: 2.2 10*3/uL (ref 0.7–4.0)
MCH: 23.4 pg — ABNORMAL LOW (ref 26.0–34.0)
MCHC: 32.2 g/dL (ref 30.0–36.0)
MCV: 72.7 fL — ABNORMAL LOW (ref 80.0–100.0)
Monocytes Absolute: 0.3 10*3/uL (ref 0.1–1.0)
Monocytes Relative: 4 %
Neutro Abs: 4.2 10*3/uL (ref 1.7–7.7)
Neutrophils Relative %: 62 %
Platelet Count: 199 10*3/uL (ref 150–400)
RBC: 4.91 MIL/uL (ref 3.87–5.11)
RDW: 15.1 % (ref 11.5–15.5)
WBC Count: 6.8 10*3/uL (ref 4.0–10.5)
nRBC: 0 % (ref 0.0–0.2)

## 2019-10-30 LAB — KAPPA/LAMBDA LIGHT CHAINS
Kappa free light chain: 106.4 mg/L — ABNORMAL HIGH (ref 3.3–19.4)
Kappa, lambda light chain ratio: 21.28 — ABNORMAL HIGH (ref 0.26–1.65)
Lambda free light chains: 5 mg/L — ABNORMAL LOW (ref 5.7–26.3)

## 2019-11-01 LAB — MULTIPLE MYELOMA PANEL, SERUM
Albumin SerPl Elph-Mcnc: 3.5 g/dL (ref 2.9–4.4)
Albumin/Glob SerPl: 0.9 (ref 0.7–1.7)
Alpha 1: 0.3 g/dL (ref 0.0–0.4)
Alpha2 Glob SerPl Elph-Mcnc: 0.6 g/dL (ref 0.4–1.0)
B-Globulin SerPl Elph-Mcnc: 2.8 g/dL — ABNORMAL HIGH (ref 0.7–1.3)
Gamma Glob SerPl Elph-Mcnc: 0.6 g/dL (ref 0.4–1.8)
Globulin, Total: 4.3 g/dL — ABNORMAL HIGH (ref 2.2–3.9)
IgA: 2525 mg/dL — ABNORMAL HIGH (ref 87–352)
IgG (Immunoglobin G), Serum: 762 mg/dL (ref 586–1602)
IgM (Immunoglobulin M), Srm: 22 mg/dL — ABNORMAL LOW (ref 26–217)
M Protein SerPl Elph-Mcnc: 1.9 g/dL — ABNORMAL HIGH
Total Protein ELP: 7.8 g/dL (ref 6.0–8.5)

## 2019-11-01 NOTE — Progress Notes (Signed)
Hannah [name]   Telephone:([phone] Fax:(336) [phone]   Clinic Follow up Note   Patient Care Team: [name], Hannah Him, MD as PCP - General (Internal Medicine) Hannah Merino, MD as Consulting Physician (Rheumatology)  Date of Service:  11/04/2019  CHIEF COMPLAINT: Follow-upanemia andsmoldering multiple myeloma  CURRENT THERAPY:  Surveillance and oral iron   INTERVAL HISTORY:  Hannah [name] is here for a follow up of anemia andsmoldering multiple myeloma. She was last seen by me 8 months ago. She presents to the clinic alone. She notes she is doing well. She notes her energy level is adequate. She is on oral iron BID. She denies any new changes. She notes she has arthritis with mainly right shoulder pain. Her TMJ pain resolved after being seen by chiropractor. She notes she stopped oral potassium but of bad taste. She has been trying to eat more potassium in her diet. I encouraged her to proceed.    REVIEW OF SYSTEMS:   Constitutional: Denies fevers, chills or abnormal weight loss Eyes: Denies blurriness of vision Ears, nose, mouth, throat, and face: Denies mucositis or sore throat Respiratory: Denies cough, dyspnea or wheezes Cardiovascular: Denies palpitation, chest discomfort or lower extremity swelling Gastrointestinal:  Denies nausea, heartburn or change in bowel habits Skin: Denies abnormal skin rashes MSK: (+) Arthritis, right shoulder pain  Lymphatics: Denies new lymphadenopathy or easy bruising Neurological:Denies numbness, tingling or new weaknesses Behavioral/Psych: Mood is stable, no new changes  All other systems were reviewed with the patient and are negative.  MEDICAL HISTORY:  Past Medical History:  Diagnosis Date  . Allergy   . Anemia   . Anxiety   . Arthritis   . Asthma   . Depression   . Hypertension     SURGICAL HISTORY: Past Surgical History:  Procedure Laterality Date  . CHOLECYSTECTOMY  1993  . COLONOSCOPY    . TOTAL  KNEE ARTHROPLASTY Right 01/28/2016  . TOTAL KNEE ARTHROPLASTY Right 01/28/2016   Procedure: RIGHT TOTAL KNEE ARTHROPLASTY;  Surgeon: Hannah Koyanagi, MD;  Location: [facility];  Service: Orthopedics;  Laterality: Right;  . TRIGGER FINGER RELEASE Right 06/18/2014   Procedure: RIGHT LONG FINGER TRIGGER RELEASE;  Surgeon: Hannah Payment, MD;  Location: [facility];  Service: Orthopedics;  Laterality: Right;  . TUBAL LIGATION    . VAGINAL HYSTERECTOMY Bilateral 11/04/2016   Procedure: HYSTERECTOMY VAGINAL with Bilateral Salpingectomy;  Surgeon: Hannah Manges, MD;  Location: [facility];  Service: Gynecology;  Laterality: Bilateral;    I have reviewed the social history and family history with the patient and they are unchanged from previous note.  ALLERGIES:  is allergic to sulfur.  MEDICATIONS:  Current Outpatient Medications  Medication Sig Dispense Refill  . budesonide-formoterol (SYMBICORT) 160-4.5 MCG/ACT inhaler Inhale 2 puffs into the lungs daily as needed.    Marland Kitchen buPROPion (WELLBUTRIN XL) 150 MG 24 hr tablet 450 mg daily (150 mg + 300 mg) 30 tablet 0  . chlorthalidone (HYGROTON) 25 MG tablet TAKE 1 TABLET BY MOUTH EVERY DAY FOR HTN  3  . ferrous sulfate 325 (65 FE) MG tablet Take 325 mg by mouth 2 (two) times daily with a meal.    . ibuprofen (ADVIL,MOTRIN) 600 MG tablet 600 MG BY MOUTH EVERY 6 HOURS FOR 5 DAYS, THEN EVERY 6 HOURS AS NEEDED FOR PAIN.  DON'T TAKE MOBIC WHILE TAKING IBUPROFEN 60 tablet 0  . KLOR-CON M20 20 MEQ tablet TAKE 1 TABLET (20 MEQ TOTAL) BY MOUTH  DAILY. TAKE 1 TAB TWICE DAILY FOR 3 DAYS THEN TAKE 1 TAB DAILY 33 tablet 0  . Multiple Vitamin (MULTIVITAMIN) tablet Take 1 tablet by mouth daily.    . naproxen sodium (ALEVE) 220 MG tablet Take 220 mg by mouth as needed.    . sertraline (ZOLOFT) 50 MG tablet Take 1 tablet (50 mg total) by mouth at bedtime. 90 tablet 0  . traZODone (DESYREL) 50 MG tablet Take 1 tablet (50 mg total) by mouth at bedtime. 90 tablet 0  .  Vitamin D, Ergocalciferol, (DRISDOL) 50000 units CAPS capsule Take 50,000 Units by mouth every Monday.      No current facility-administered medications for this visit.    PHYSICAL EXAMINATION: ECOG PERFORMANCE STATUS: 0 - Asymptomatic  Vitals:   11/04/19 1121  BP: (!) 141/88  Pulse: 90  Resp: 20  Temp: (!) 97.3 F (36.3 C)  SpO2: 97%   Filed Weights   11/04/19 1121  Weight: 242 lb 1.6 oz (109.8 kg)    Due to COVID19 we will limit examination to appearance. Patient had no complaints.  GENERAL:alert, no distress and comfortable SKIN: skin color normal, no rashes or significant lesions EYES: normal, Conjunctiva are pink and non-injected, sclera clear  NEURO: alert & oriented x 3 with fluent speech   LABORATORY DATA:  I have reviewed the data as listed CBC Latest Ref Rng & Units 10/29/2019 08/16/2019 02/28/2019  WBC 4.0 - 10.5 K/uL 6.8 7.0 7.5  Hemoglobin 12.0 - 15.0 g/dL 11.5(L) 11.2(L) 11.8(L)  Hematocrit 36.0 - 46.0 % 35.7(L) 34.5(L) 35.9(L)  Platelets 150 - 400 K/uL 199 196 215     CMP Latest Ref Rng & Units 10/29/2019 08/16/2019 08/29/2018  Glucose 70 - 99 mg/dL 91 104(H) 107(H)  BUN 6 - 20 mg/dL 12 11 16   Creatinine 0.44 - 1.00 mg/dL 0.82 0.79 0.91  Sodium 135 - 145 mmol/L 141 141 139  Potassium 3.5 - 5.1 mmol/L 3.5 3.4(L) 3.2(L)  Chloride 98 - 111 mmol/L 100 105 100  CO2 22 - 32 mmol/L 30 26 27   Calcium 8.9 - 10.3 mg/dL 9.1 8.3(L) 8.9  Total Protein 6.5 - 8.1 g/dL 8.3(H) 7.3 8.2(H)  Total Bilirubin 0.3 - 1.2 mg/dL 0.4 0.3 0.3  Alkaline Phos 38 - 126 U/L 78 73 83  AST 15 - 41 U/L 13(L) 13(L) 14(L)  ALT 0 - 44 U/L 13 9 13       RADIOGRAPHIC STUDIES: I have personally reviewed the radiological images as listed and agreed with the findings in the report. No results found.   ASSESSMENT & PLAN:  Hannah [name] is a 59 y.o. female with    1. Smoldering multiple myeloma, IgA Kappa type -Wepreviouslyreviewed her medical chart including labs and imaging in detail  with the patient.  -Herpreviousoutsidetest showed elevated M protein 1.2g/dl, elevated IgA level, light chain levels were within normal limits and ratio was slightly increased.She does have mild anemia, likely related to iron deficiency, no renal failure, hypercalcemia, neuropathy, and her previous surgery was negative. -Discussed her bone marrow biopsy findingsfrom 10/2017. She has increased plasma (6% on aspirate, 20% by CD 138). I previouslydiscussed with pathologist Dr. Gari Crown, the increased plasma cells in bone marrow is likely between 10 to 20%, and would favor smoldering multiple myeloma than MGUS -Also patient does not meet CRAB for MM, she does have arthritis of low back, hip and right shoulder. Ipreviouslyobtaineda pelvic and lumbar MRI which was negative for myeloma involvement -She is clinically doing well. Labs  reviewed, Hg 11.5, protein 8.3, IgA increased to 2525, IgM 22, M-Protein increased to 1.9, Kappa light chain 106.4, Lambda light chain 5, ratio increased to 21.28.  -We again discussed she is at high risk for developing multiple myeloma. I discussed if her light chain ratio increases above 100, that is diagnostic for MM. I reviewed CRAB criteria with her. If labs worsen may obtain bone marrow biopsy.  -I discussed there is research being done on treatment for Smoldering Myeloma, but current standard care is observation. I recommend continue monitoring, no indication for treatment at this point.  -Will do bone survey in 1-2 weeks and 24 Hr urine before her next visit.  -Will monitor closer with Lab every 3 months and f/u in 6 months.  2. 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  -Hemoglobin electrophoresisshowed increase in Hg C, consistent with Hg C trait. I discussed the results with her and informed her that her children are at risk ofhemoglobin C trait, and I encouraged him to be  tested. -Anemia overall mild and stable. Continue oral iron BID.   3. HTN -On chlorthalidone. Continue to f/u with PCP   4. OA -She has imaging evidence consistent with OA in multiple joints. Pain currently controlled with NSAIDs and tylenol PRN.  -She was evaluated by rheumatology, will f/u there on PRN basis Seen to have extensive disc issues on Cervical MRI on 09/12/19.  -She will continue to f/u with PCP, Chiropractor and Ortho.   5. Anxiety, depression -under care of [facility] provider, stable and tolerating zoloft   6. Obesity  -She is trying to lose weight and exercise  -Ipreviouslyoffered to refer her to a weight loss program, but she states that she is trying on her own with diet and exercise. -Weight stable     PLAN: -24 hr Urine lab in 3 months  -Bone survey in 1-2 weeks  -Lab in 3 months  -F/u in 6 months with lab a week before.    No problem-specific Assessment & Plan notes found for this encounter.   Orders Placed This Encounter  Procedures  . DG Bone Survey Met    Standing Status:   Future    Standing Expiration Date:   11/03/2020    Order Specific Question:   Reason for Exam (SYMPTOM  OR DIAGNOSIS REQUIRED)    Answer:   screening for MM    Order Specific Question:   Is patient pregnant?    Answer:   No    Order Specific Question:   Preferred imaging location?    Answer:   [facility]    Order Specific Question:   Radiology Contrast Protocol - do NOT remove file path    Answer:   [email or web address]  . 24-Hr Ur UPEP/UIFE/Light Chains/TP    In 3 or 6 months    Standing Status:   Future    Standing Expiration Date:   11/03/2020   All questions were answered. The patient knows to call the clinic with any problems, questions or concerns. No barriers to learning was detected. The total time spent in the appointment was 25 minutes.     Truitt Merle, MD 11/04/2019   I, Joslyn Devon, am acting as scribe for Truitt Merle, MD.   I have reviewed the above documentation for accuracy and completeness, and I agree with the above.

## 2019-11-04 ENCOUNTER — Inpatient Hospital Stay (HOSPITAL_BASED_OUTPATIENT_CLINIC_OR_DEPARTMENT_OTHER): Payer: BC Managed Care – PPO | Admitting: Hematology

## 2019-11-04 ENCOUNTER — Encounter: Payer: Self-pay | Admitting: Hematology

## 2019-11-04 ENCOUNTER — Other Ambulatory Visit: Payer: Self-pay

## 2019-11-04 VITALS — BP 141/88 | HR 90 | Temp 97.3°F | Resp 20 | Ht 66.0 in | Wt 242.1 lb

## 2019-11-04 DIAGNOSIS — C9000 Multiple myeloma not having achieved remission: Secondary | ICD-10-CM

## 2019-11-04 DIAGNOSIS — M25511 Pain in right shoulder: Secondary | ICD-10-CM | POA: Diagnosis not present

## 2019-11-04 DIAGNOSIS — F419 Anxiety disorder, unspecified: Secondary | ICD-10-CM | POA: Diagnosis not present

## 2019-11-04 DIAGNOSIS — D472 Monoclonal gammopathy: Secondary | ICD-10-CM

## 2019-11-04 DIAGNOSIS — D509 Iron deficiency anemia, unspecified: Secondary | ICD-10-CM | POA: Diagnosis not present

## 2019-11-04 DIAGNOSIS — F329 Major depressive disorder, single episode, unspecified: Secondary | ICD-10-CM | POA: Diagnosis not present

## 2019-11-04 DIAGNOSIS — I10 Essential (primary) hypertension: Secondary | ICD-10-CM | POA: Diagnosis not present

## 2019-11-04 DIAGNOSIS — E669 Obesity, unspecified: Secondary | ICD-10-CM | POA: Diagnosis not present

## 2019-11-04 DIAGNOSIS — M1990 Unspecified osteoarthritis, unspecified site: Secondary | ICD-10-CM | POA: Diagnosis not present

## 2019-11-04 DIAGNOSIS — R768 Other specified abnormal immunological findings in serum: Secondary | ICD-10-CM | POA: Diagnosis not present

## 2019-11-04 NOTE — Progress Notes (Signed)
Hannah [name] - 59 y.o. female MRN [id number]  Date of birth: Oct 21, 1960  Office Visit Note: Visit Date: 05/08/2019 PCP: [name], MD Referred by: [name], MD  Subjective: Chief Complaint  Patient presents with  . Lower Back - Pain   HPI:  Hannah [name] is a 59 y.o. female who comes in today for planned Bilateral L4-L5 lumbar facet/medial branch block with fluoroscopic guidance.  The patient has failed conservative care including home exercise, medications, time and activity modification.  This injection will be diagnostic and hopefully therapeutic.  Please see requesting physician notes for further details and justification.  Exam shows concordant low back pain with facet joint loading and extension.  MRI reviewed with images and spine model.  MRI reviewed in the note below.     ROS Otherwise per HPI.  Assessment & Plan: Visit Diagnoses:  1. Spondylosis without myelopathy or radiculopathy, lumbar region     Plan: No additional findings.   Meds & Orders:  Meds ordered this encounter  Medications  . methylPREDNISolone acetate (DEPO-MEDROL) injection 80 mg    Orders Placed This Encounter  Procedures  . Facet Injection  . XR C-ARM NO REPORT    Follow-up: Return if symptoms worsen or fail to improve.   Procedures: No procedures performed  Lumbar Facet Joint Intra-Articular Injection(s) with Fluoroscopic Guidance  Patient: Hannah [name]      Date of Birth: Sep 18, 1960 MRN: [id number] PCP: [name], MD      Visit Date: 05/08/2019   Universal Protocol:    Date/Time: 05/08/2019  Consent Given By: the patient  Position: PRONE   Additional Comments: Vital signs were monitored before and after the procedure. Patient was prepped and draped in the usual sterile fashion. The correct patient, procedure, and site was verified.   Injection Procedure Details:  Procedure Site One Meds Administered:  Meds ordered this encounter    Medications  . methylPREDNISolone acetate (DEPO-MEDROL) injection 80 mg     Laterality: Bilateral  Location/Site:  L4-L5  Needle size: 22 guage  Needle type: Spinal  Needle Placement: Articular  Findings:  -Comments: Excellent flow of contrast producing a partial arthrogram.  Procedure Details: The fluoroscope beam is vertically oriented in AP, and the inferior recess is visualized beneath the lower pole of the inferior apophyseal process, which represents the target point for needle insertion. When direct visualization is difficult the target point is located at the medial projection of the vertebral pedicle. The region overlying each aforementioned target is locally anesthetized with a 1 to 2 ml. volume of 1% Lidocaine without Epinephrine.   The spinal needle was inserted into each of the above mentioned facet joints using biplanar fluoroscopic guidance. A 0.25 to 0.5 ml. volume of Isovue-250 was injected and a partial facet joint arthrogram was obtained. A single spot film was obtained of the resulting arthrogram.    One to 1.25 ml of the steroid/anesthetic solution was then injected into each of the facet joints noted above.   Additional Comments:  The patient tolerated the procedure well Dressing: 2 x 2 sterile gauze and Band-Aid    Post-procedure details: Patient was observed during the procedure. Post-procedure instructions were reviewed.  Patient left the clinic in stable condition.     Clinical History: MRI LUMBAR SPINE WITHOUT AND WITH CONTRAST  TECHNIQUE: Multiplanar and multiecho pulse sequences of the lumbar spine were obtained without and with intravenous contrast.  CONTRAST:  53mL MULTIHANCE GADOBENATE DIMEGLUMINE 529 MG/ML IV  SOLN  COMPARISON:  Radiography 11/06/2017  FINDINGS: Segmentation:  5 lumbar type vertebral bodies.  Alignment:  No malalignment.  Vertebrae: No fracture or bone lesion. No myeloma deposits evident.  Conus  medullaris and cauda equina: Conus extends to the L1 level. Conus and cauda equina appear normal.  Paraspinal and other soft tissues: Negative  Disc levels:  No abnormality at L2-3 or above.  L3-4: Shallow protrusion of the disc more towards the right. Facet degeneration and hypertrophy. Some edema and enhancement of the facets which could contribute to back pain. Mild narrowing of the right lateral recess and mild right foraminal encroachment without visible neural compression.  L4-5: Mild bulging of the disc. Bilateral facet osteoarthritis with mild edema and enhancement. No stenosis or neural compression.  L5-S1: Mild bulging of the disc. Mild facet osteoarthritis. No stenosis.  IMPRESSION: No evidence of myeloma involvement in the region from T12 through S3.  L3-4: Shallow disc protrusion more towards the right. Facet osteoarthritis right worse than left. Findings could certainly be associated with back pain. There is some potential for irritation of the right L3 and L4 nerves as well. Definite neural compression is not demonstrated however.  L4-5: Disc bulge and facet osteoarthritis. Findings could contribute to low back pain.  L5-S1: Minimal disc bulge and facet osteoarthritis.   Electronically Signed   By: Nelson Chimes M.D.   On: 12/15/2017 09:50     Objective:  VS:  HT:    WT:   BMI:     BP:(!) 154/98  HR:87bpm  TEMP: ( )  RESP:  Physical Exam Constitutional:      General: She is not in acute distress.    Appearance: Normal appearance. She is not ill-appearing.  HENT:     Head: Normocephalic and atraumatic.     Right Ear: External ear normal.     Left Ear: External ear normal.  Eyes:     Extraocular Movements: Extraocular movements intact.  Cardiovascular:     Rate and Rhythm: Normal rate.     Pulses: Normal pulses.  Musculoskeletal:     Right lower leg: No edema.     Left lower leg: No edema.     Comments: Patient has good distal  strength with no pain over the greater trochanters.  No clonus or focal weakness.Exam shows concordant low back pain with facet joint loading and extension.   Skin:    Findings: No erythema, lesion or rash.  Neurological:     General: No focal deficit present.     Mental Status: She is alert and oriented to person, place, and time.     Sensory: No sensory deficit.     Motor: No weakness or abnormal muscle tone.     Coordination: Coordination normal.  Psychiatric:        Mood and Affect: Mood normal.        Behavior: Behavior normal.      Imaging: No results found.

## 2019-11-04 NOTE — Procedures (Signed)
Lumbar Facet Joint Intra-Articular Injection(s) with Fluoroscopic Guidance  Patient: Hannah [name]      Date of Birth: April 16, 1961 MRN: [id number] PCP: [name], MD      Visit Date: 05/08/2019   Universal Protocol:    Date/Time: 05/08/2019  Consent Given By: the patient  Position: PRONE   Additional Comments: Vital signs were monitored before and after the procedure. Patient was prepped and draped in the usual sterile fashion. The correct patient, procedure, and site was verified.   Injection Procedure Details:  Procedure Site One Meds Administered:  Meds ordered this encounter  Medications  . methylPREDNISolone acetate (DEPO-MEDROL) injection 80 mg     Laterality: Bilateral  Location/Site:  L4-L5  Needle size: 22 guage  Needle type: Spinal  Needle Placement: Articular  Findings:  -Comments: Excellent flow of contrast producing a partial arthrogram.  Procedure Details: The fluoroscope beam is vertically oriented in AP, and the inferior recess is visualized beneath the lower pole of the inferior apophyseal process, which represents the target point for needle insertion. When direct visualization is difficult the target point is located at the medial projection of the vertebral pedicle. The region overlying each aforementioned target is locally anesthetized with a 1 to 2 ml. volume of 1% Lidocaine without Epinephrine.   The spinal needle was inserted into each of the above mentioned facet joints using biplanar fluoroscopic guidance. A 0.25 to 0.5 ml. volume of Isovue-250 was injected and a partial facet joint arthrogram was obtained. A single spot film was obtained of the resulting arthrogram.    One to 1.25 ml of the steroid/anesthetic solution was then injected into each of the facet joints noted above.   Additional Comments:  The patient tolerated the procedure well Dressing: 2 x 2 sterile gauze and Band-Aid    Post-procedure details: Patient was  observed during the procedure. Post-procedure instructions were reviewed.  Patient left the clinic in stable condition.

## 2019-11-05 ENCOUNTER — Telehealth: Payer: Self-pay | Admitting: Hematology

## 2019-11-05 NOTE — Telephone Encounter (Deleted)
Scheduled appt per 6/7 los.  Printed calendar and avs 

## 2019-11-05 NOTE — Telephone Encounter (Signed)
Scheduled appt per 6/7 los.  Printed and mailed appt calendar. 

## 2019-11-27 DIAGNOSIS — M62838 Other muscle spasm: Secondary | ICD-10-CM | POA: Diagnosis not present

## 2019-11-27 DIAGNOSIS — K439 Ventral hernia without obstruction or gangrene: Secondary | ICD-10-CM | POA: Diagnosis not present

## 2019-11-27 DIAGNOSIS — E669 Obesity, unspecified: Secondary | ICD-10-CM | POA: Diagnosis not present

## 2019-11-27 DIAGNOSIS — I10 Essential (primary) hypertension: Secondary | ICD-10-CM | POA: Diagnosis not present

## 2019-12-03 LAB — UPEP/UIFE/LIGHT CHAINS/TP, 24-HR UR
% BETA, Urine: 18.9 %
ALPHA 1 URINE: 3.4 %
Albumin, U: 18.8 %
Alpha 2, Urine: 8.9 %
Free Kappa Lt Chains,Ur: 337.54 mg/L — ABNORMAL HIGH (ref 0.63–113.79)
Free Kappa/Lambda Ratio: 189.63 — ABNORMAL HIGH (ref 1.03–31.76)
Free Lambda Lt Chains,Ur: 1.78 mg/L (ref 0.47–11.77)
GAMMA GLOBULIN URINE: 50 %
M-SPIKE %, Urine: 35.3 % — ABNORMAL HIGH
M-Spike, Mg/24 Hr: 50 mg/24 hr — ABNORMAL HIGH
Total Protein, Urine-Ur/day: 143 mg/24 hr (ref 30–150)
Total Protein, Urine: 11.9 mg/dL
Total Volume: 1200

## 2019-12-10 ENCOUNTER — Telehealth: Payer: Self-pay | Admitting: *Deleted

## 2019-12-10 NOTE — Telephone Encounter (Signed)
Contacted patient with information as per Dr. Ernestina Penna message. Bone survey scheduled for 12/23/19 at 10:30 am at [facility], patient to arrive at 10:15. Advised patient to contact Radiology Scheduling at [phone] if change needed for appt. Patient verbalized understanding of all information.

## 2019-12-10 NOTE — Telephone Encounter (Signed)
-----   Message from Truitt Merle, MD sent at 12/07/2019 10:39 AM EDT ----- Please let pt know her urine test result, which was positive for M protein and Kappa light chain but level is not very high. She had no prior UPEP to compare. Please schedule her bone survey for her, thanks   Truitt Merle  12/07/2019

## 2019-12-23 ENCOUNTER — Ambulatory Visit (HOSPITAL_COMMUNITY)
Admission: RE | Admit: 2019-12-23 | Discharge: 2019-12-23 | Disposition: A | Discharge status: Home / Self Care | Payer: BC Managed Care – PPO | Source: Ambulatory Visit | Attending: Hematology | Admitting: Hematology

## 2019-12-23 ENCOUNTER — Other Ambulatory Visit (HOSPITAL_COMMUNITY): Payer: BC Managed Care – PPO

## 2019-12-23 ENCOUNTER — Other Ambulatory Visit: Payer: Self-pay

## 2019-12-23 DIAGNOSIS — D472 Monoclonal gammopathy: Secondary | ICD-10-CM

## 2019-12-23 DIAGNOSIS — C9000 Multiple myeloma not having achieved remission: Secondary | ICD-10-CM | POA: Diagnosis not present

## 2019-12-24 ENCOUNTER — Telehealth: Payer: Self-pay

## 2019-12-24 NOTE — Telephone Encounter (Signed)
Called spoke with pt bone survey results no concerns encouraged pt to call for any changes or concerns

## 2019-12-26 DIAGNOSIS — K432 Incisional hernia without obstruction or gangrene: Secondary | ICD-10-CM | POA: Diagnosis not present

## 2019-12-31 ENCOUNTER — Ambulatory Visit (INDEPENDENT_AMBULATORY_CARE_PROVIDER_SITE_OTHER): Payer: BC Managed Care – PPO | Admitting: Orthopaedic Surgery

## 2019-12-31 ENCOUNTER — Encounter: Payer: Self-pay | Admitting: Orthopaedic Surgery

## 2019-12-31 VITALS — Ht 66.5 in | Wt 242.2 lb

## 2019-12-31 DIAGNOSIS — M65332 Trigger finger, left middle finger: Secondary | ICD-10-CM | POA: Diagnosis not present

## 2019-12-31 NOTE — Progress Notes (Signed)
Office Visit Note   Patient: Hannah [name]           Date of Birth: [birth year]           MRN: [id number] Visit Date: 12/31/2019              Requested by: [name], MD [address] [place],  [place] [address] PCP: Osborne Casco Fransico Him, MD   Assessment & Plan: Visit Diagnoses:  1. Trigger finger, left middle finger     Plan: Impression is left hand long finger trigger finger.  We have discussed nonoperative treatment to include cortisone injection versus operative treatment to include A1 pulley release.  She is really hesitant to proceed with cortisone injection and would rather go ahead and proceed with the surgery.  Risks, benefits and poss complications reviewed.  Rehab and recovery time discussed.  All questions were answered.  Follow-Up Instructions: Return for post-op.   Orders:  No orders of the defined types were placed in this encounter.  No orders of the defined types were placed in this encounter.     Procedures: No procedures performed   Clinical Data: No additional findings.   Subjective: Chief Complaint  Patient presents with   Left Hand - Pain    MIDDLE TRIGGER FINGER     HPI patient is a 59 year old female who comes in today with triggering and pain to left long finger.  She noticed this a couple months ago and has recently worsened.  She has noticed increased frequency any triggering.  No previous cortisone injection to this trigger finger.  She does note a history of right hand index trigger finger with subsequent A1 pulley release.  No previous injections prior to surgery on that side.  She is not a diabetic.  Review of Systems as detailed in HPI.  All others reviewed and are negative.   Objective: Vital Signs: Ht 5' 6.5" (1.689 m)    Wt 242 lb 3.2 oz (109.9 kg)    LMP 10/22/2016 (Exact Date)    BMI 38.51 kg/m   Physical Exam well-developed and well-nourished female in no acute distress.  Alert and oriented x3.  Ortho Exam  examination of the left long finger reveals a moderately palpable and tender nodule at the A1 pulley.  She does have reproducible triggering.  She is neurovascular intact distally.  Specialty Comments:  No specialty comments available.  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   MDD (major depressive disorder), recurrent episode, moderate ([place]) 11/04/2017   MGUS (monoclonal gammopathy of unknown significance) 10/24/2017   Primary osteoarthritis of both hands 09/26/2017   Primary osteoarthritis of right shoulder 09/26/2017   Primary osteoarthritis of both hips 09/26/2017   Status post total knee replacement, right 09/26/2017   Primary osteoarthritis of both feet 09/26/2017   History of asthma 09/26/2017   Primary osteoarthritis of left knee 04/18/2017   Menorrhagia 11/04/2016   Fibroid tumor 11/04/2016   Adenomyosis 11/04/2016   Hypertension, essential 11/04/2016   Total knee replacement status 01/28/2016   Past Medical History:  Diagnosis Date   Allergy    Anemia    Anxiety    Arthritis    Asthma    Depression    Hypertension     Family History  Problem Relation Age of Onset   Cancer Mother        uterine    Asthma Mother    Arthritis Daughter    Depression Maternal Uncle    Colon cancer Neg Hx     Past Surgical History:  Procedure Laterality Date   CHOLECYSTECTOMY  1993   COLONOSCOPY     TOTAL KNEE ARTHROPLASTY Right 01/28/2016   TOTAL KNEE ARTHROPLASTY Right 01/28/2016   Procedure: RIGHT TOTAL KNEE ARTHROPLASTY;  Surgeon: [name], MD;  Location: [facility];  Service: Orthopedics;  Laterality: Right;   TRIGGER FINGER RELEASE Right 06/18/2014   Procedure: RIGHT LONG FINGER TRIGGER RELEASE;  Surgeon: [name], MD;  Location: [facility];  Service: Orthopedics;  Laterality: Right;   TUBAL LIGATION     VAGINAL HYSTERECTOMY Bilateral 11/04/2016   Procedure: HYSTERECTOMY VAGINAL with Bilateral Salpingectomy;  Surgeon: [name], MD;  Location: [facility];  Service: Gynecology;  Laterality: Bilateral;   Social History   Occupational History   Not on file  Tobacco Use   Smoking status: Never Smoker   Smokeless tobacco: Never Used  Vaping Use   Vaping Use: Never used  Substance and Sexual Activity   Alcohol use: No    Alcohol/week: 0.0 standard drinks   Drug use: No   Sexual activity: Yes    Partners: Male    Birth control/protection: Surgical

## 2020-01-20 ENCOUNTER — Telehealth: Payer: Self-pay | Admitting: Orthopaedic Surgery

## 2020-01-20 NOTE — Telephone Encounter (Signed)
Received call from pt regarding her [employer] forms. I advised [employer] completed on 8/13 and mailed her copy.

## 2020-01-27 DIAGNOSIS — Z01818 Encounter for other preprocedural examination: Secondary | ICD-10-CM | POA: Diagnosis not present

## 2020-01-31 DIAGNOSIS — K432 Incisional hernia without obstruction or gangrene: Secondary | ICD-10-CM | POA: Diagnosis not present

## 2020-02-04 ENCOUNTER — Inpatient Hospital Stay: Payer: BC Managed Care – PPO | Attending: Hematology

## 2020-02-04 ENCOUNTER — Other Ambulatory Visit: Payer: Self-pay

## 2020-02-04 DIAGNOSIS — C9000 Multiple myeloma not having achieved remission: Secondary | ICD-10-CM | POA: Diagnosis not present

## 2020-02-04 DIAGNOSIS — D472 Monoclonal gammopathy: Secondary | ICD-10-CM

## 2020-02-04 LAB — CBC WITH DIFFERENTIAL (CANCER CENTER ONLY)
Abs Immature Granulocytes: 0.04 10*3/uL (ref 0.00–0.07)
Basophils Absolute: 0 10*3/uL (ref 0.0–0.1)
Basophils Relative: 0 %
Eosinophils Absolute: 0.2 10*3/uL (ref 0.0–0.5)
Eosinophils Relative: 2 %
HCT: 34.2 % — ABNORMAL LOW (ref 36.0–46.0)
Hemoglobin: 11.2 g/dL — ABNORMAL LOW (ref 12.0–15.0)
Immature Granulocytes: 0 %
Lymphocytes Relative: 23 %
Lymphs Abs: 2.2 10*3/uL (ref 0.7–4.0)
MCH: 23.7 pg — ABNORMAL LOW (ref 26.0–34.0)
MCHC: 32.7 g/dL (ref 30.0–36.0)
MCV: 72.5 fL — ABNORMAL LOW (ref 80.0–100.0)
Monocytes Absolute: 0.5 10*3/uL (ref 0.1–1.0)
Monocytes Relative: 5 %
Neutro Abs: 6.8 10*3/uL (ref 1.7–7.7)
Neutrophils Relative %: 70 %
Platelet Count: 230 10*3/uL (ref 150–400)
RBC: 4.72 MIL/uL (ref 3.87–5.11)
RDW: 15.5 % (ref 11.5–15.5)
WBC Count: 9.7 10*3/uL (ref 4.0–10.5)
nRBC: 0 % (ref 0.0–0.2)

## 2020-02-04 LAB — CMP (CANCER CENTER ONLY)
ALT: 8 U/L (ref 0–44)
AST: 10 U/L — ABNORMAL LOW (ref 15–41)
Albumin: 3.2 g/dL — ABNORMAL LOW (ref 3.5–5.0)
Alkaline Phosphatase: 70 U/L (ref 38–126)
Anion gap: 9 (ref 5–15)
BUN: 17 mg/dL (ref 6–20)
CO2: 29 mmol/L (ref 22–32)
Calcium: 9.4 mg/dL (ref 8.9–10.3)
Chloride: 100 mmol/L (ref 98–111)
Creatinine: 0.88 mg/dL (ref 0.44–1.00)
GFR, Est AFR Am: >60 mL/min (ref >60)
GFR, Estimated: >60 mL/min (ref >60)
Glucose, Bld: 97 mg/dL (ref 70–99)
Potassium: 3.9 mmol/L (ref 3.5–5.1)
Sodium: 138 mmol/L (ref 135–145)
Total Bilirubin: 0.5 mg/dL (ref 0.3–1.2)
Total Protein: 9 g/dL — ABNORMAL HIGH (ref 6.5–8.1)

## 2020-02-05 LAB — KAPPA/LAMBDA LIGHT CHAINS
Kappa free light chain: 258 mg/L — ABNORMAL HIGH (ref 3.3–19.4)
Kappa, lambda light chain ratio: 47.78 — ABNORMAL HIGH (ref 0.26–1.65)
Lambda free light chains: 5.4 mg/L — ABNORMAL LOW (ref 5.7–26.3)

## 2020-02-06 LAB — MULTIPLE MYELOMA PANEL, SERUM
Albumin SerPl Elph-Mcnc: 3.4 g/dL (ref 2.9–4.4)
Albumin/Glob SerPl: 0.7 (ref 0.7–1.7)
Alpha 1: 0.3 g/dL (ref 0.0–0.4)
Alpha2 Glob SerPl Elph-Mcnc: 0.8 g/dL (ref 0.4–1.0)
B-Globulin SerPl Elph-Mcnc: 3.4 g/dL — ABNORMAL HIGH (ref 0.7–1.3)
Gamma Glob SerPl Elph-Mcnc: 0.6 g/dL (ref 0.4–1.8)
Globulin, Total: 5 g/dL — ABNORMAL HIGH (ref 2.2–3.9)
IgA: 3211 mg/dL — ABNORMAL HIGH (ref 87–352)
IgG (Immunoglobin G), Serum: 663 mg/dL (ref 586–1602)
IgM (Immunoglobulin M), Srm: 20 mg/dL — ABNORMAL LOW (ref 26–217)
M Protein SerPl Elph-Mcnc: 2.1 g/dL — ABNORMAL HIGH
Total Protein ELP: 8.4 g/dL (ref 6.0–8.5)

## 2020-02-12 DIAGNOSIS — I10 Essential (primary) hypertension: Secondary | ICD-10-CM | POA: Diagnosis not present

## 2020-02-12 DIAGNOSIS — C9000 Multiple myeloma not having achieved remission: Secondary | ICD-10-CM | POA: Diagnosis not present

## 2020-02-13 ENCOUNTER — Inpatient Hospital Stay: Payer: BC Managed Care – PPO | Admitting: Orthopaedic Surgery

## 2020-02-13 ENCOUNTER — Telehealth: Payer: Self-pay | Admitting: Hematology

## 2020-02-13 ENCOUNTER — Telehealth: Payer: Self-pay

## 2020-02-13 NOTE — Telephone Encounter (Signed)
I spoke with Ms Roskos.  I reveiwed Dr. Ernestina Penna comments and recommendations with her.  Dr Burr Medico would like to see her in 2 months instead of 3.  Ms Barrasso verbalized understanding.  Scheduling message sent

## 2020-02-13 NOTE — Telephone Encounter (Signed)
R/s appt per 9/16 sch msg - left message for patient with new apt date and time

## 2020-02-25 ENCOUNTER — Other Ambulatory Visit: Payer: Self-pay | Admitting: Physician Assistant

## 2020-02-25 MED ORDER — HYDROCODONE-ACETAMINOPHEN 5-325 MG PO TABS: 1.0000 | ORAL_TABLET | Freq: Three times a day (TID) | ORAL | 0 refills | Status: DC | PRN

## 2020-02-25 MED ORDER — ONDANSETRON HCL 4 MG PO TABS: 4.0000 mg | ORAL_TABLET | Freq: Three times a day (TID) | ORAL | 0 refills | Status: DC | PRN

## 2020-02-27 ENCOUNTER — Encounter: Payer: Self-pay | Admitting: Orthopaedic Surgery

## 2020-02-27 DIAGNOSIS — M65842 Other synovitis and tenosynovitis, left hand: Secondary | ICD-10-CM | POA: Diagnosis not present

## 2020-02-27 DIAGNOSIS — M65332 Trigger finger, left middle finger: Secondary | ICD-10-CM | POA: Diagnosis not present

## 2020-02-28 ENCOUNTER — Ambulatory Visit (INDEPENDENT_AMBULATORY_CARE_PROVIDER_SITE_OTHER): Payer: BC Managed Care – PPO

## 2020-02-28 DIAGNOSIS — M65332 Trigger finger, left middle finger: Secondary | ICD-10-CM

## 2020-02-28 NOTE — Progress Notes (Signed)
Patient came in for Nurse visit. Rewrapped her her left hand. New 4x4 applied and ace wrap applied.

## 2020-03-05 ENCOUNTER — Ambulatory Visit (INDEPENDENT_AMBULATORY_CARE_PROVIDER_SITE_OTHER): Payer: BC Managed Care – PPO | Admitting: Physician Assistant

## 2020-03-05 ENCOUNTER — Encounter: Payer: Self-pay | Admitting: Orthopaedic Surgery

## 2020-03-05 DIAGNOSIS — Z9889 Other specified postprocedural states: Secondary | ICD-10-CM

## 2020-03-05 NOTE — Progress Notes (Signed)
Post-Op Visit Note   Patient: Hannah [name]           Date of Birth: 1960-06-02           MRN: [id number] Visit Date: 03/05/2020 PCP: [name], MD   Assessment & Plan:  Chief Complaint:  Chief Complaint  Patient presents with  . Left Hand - Pain   Visit Diagnoses:  1. S/P trigger finger release     Plan: Patient is a pleasant 59 year old female who comes in today 1 week out left long trigger finger release 02/27/2020. She has been doing okay. She notes moderate throbbing at times which is relieved with Aleve, Tylenol and hydrocodone. No fevers or chills. Examination of her left hand reveals a well-healing surgical incision with nylon sutures in place. No evidence of infection or cellulitis. Fingers are warm and well-perfused. Today, the wound was recovered. She will avoid any heavy lifting or submerging her hand in water for another few weeks. She will follow up with [place] next week for repeat evaluation and probable suture removal. We have provided her with a return to work note for 03/30/2020. Call with concerns or questions.  Follow-Up Instructions: Return in about 1 week (around 03/12/2020).   Orders:  No orders of the defined types were placed in this encounter.  No orders of the defined types were placed in this encounter.   Imaging: No new imaging  PMFS History: Patient Active Problem List   Diagnosis Date Noted  . Stenosing tenosynovitis of finger of left hand 02/27/2020  . 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 Major depression, recurrent ([place]) 01/12/2018  . Chronic right shoulder pain 12/20/2017  . 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 MDD (major depressive disorder), recurrent episode, moderate ([place]) 11/04/2017  . MGUS (monoclonal gammopathy of unknown significance) 10/24/2017  . Primary osteoarthritis of both hands 09/26/2017  . Primary osteoarthritis of right shoulder 09/26/2017  . Primary osteoarthritis of both hips 09/26/2017  . Status  post total knee replacement, right 09/26/2017  . Primary osteoarthritis of both feet 09/26/2017  . History of asthma 09/26/2017  . Primary osteoarthritis of left knee 04/18/2017  . Menorrhagia 11/04/2016  . Fibroid tumor 11/04/2016  . Adenomyosis 11/04/2016  . Hypertension, essential 11/04/2016  . 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 Allergy   . Anemia   . Anxiety   . Arthritis   . Asthma   . Depression   . Hypertension     Family History  Problem Relation Age of Onset  . Cancer Mother        uterine   . Asthma Mother   . Arthritis Daughter   . Depression Maternal Uncle   . Colon cancer Neg Hx     Past Surgical History:  Procedure Laterality Date  . CHOLECYSTECTOMY  1993  . COLONOSCOPY    . 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 TOTAL KNEE ARTHROPLASTY Right 01/28/2016   Procedure: RIGHT TOTAL KNEE ARTHROPLASTY;  Surgeon: [name], MD;  Location: [facility];  Service: Orthopedics;  Laterality: Right;  . TRIGGER FINGER RELEASE Right 06/18/2014   Procedure: RIGHT LONG FINGER TRIGGER RELEASE;  Surgeon: [name], MD;  Location: [facility];  Service: Orthopedics;  Laterality: Right;  . TUBAL LIGATION    . VAGINAL HYSTERECTOMY Bilateral 11/04/2016   Procedure: HYSTERECTOMY VAGINAL with Bilateral Salpingectomy;  Surgeon: [name], MD;  Location: [facility];  Service:  Gynecology;  Laterality: Bilateral;   Social History   Occupational History  . Not on file  Tobacco Use  . Smoking status: Never Smoker  . Smokeless tobacco: Never Used  Vaping Use  . Vaping Use: Never used  Substance and Sexual Activity  . Alcohol use: No    Alcohol/week: 0.0 standard drinks  . Drug use: No  . Sexual activity: Yes    Partners: Male    Birth control/protection: Surgical

## 2020-03-09 ENCOUNTER — Telehealth: Payer: Self-pay | Admitting: Orthopaedic Surgery

## 2020-03-09 NOTE — Telephone Encounter (Signed)
[employer] forms received. Sent to [employer] 

## 2020-03-12 ENCOUNTER — Ambulatory Visit (INDEPENDENT_AMBULATORY_CARE_PROVIDER_SITE_OTHER): Payer: BC Managed Care – PPO | Admitting: Orthopaedic Surgery

## 2020-03-12 ENCOUNTER — Ambulatory Visit: Payer: Self-pay

## 2020-03-12 ENCOUNTER — Ambulatory Visit (INDEPENDENT_AMBULATORY_CARE_PROVIDER_SITE_OTHER): Payer: BC Managed Care – PPO | Admitting: Orthopedic Surgery

## 2020-03-12 ENCOUNTER — Encounter: Payer: Self-pay | Admitting: Orthopedic Surgery

## 2020-03-12 ENCOUNTER — Encounter: Payer: Self-pay | Admitting: Orthopaedic Surgery

## 2020-03-12 VITALS — Ht 66.5 in | Wt 242.0 lb

## 2020-03-12 VITALS — Ht 66.0 in | Wt 242.0 lb

## 2020-03-12 DIAGNOSIS — M205X1 Other deformities of toe(s) (acquired), right foot: Secondary | ICD-10-CM

## 2020-03-12 DIAGNOSIS — M79674 Pain in right toe(s): Secondary | ICD-10-CM

## 2020-03-12 DIAGNOSIS — M65332 Trigger finger, left middle finger: Secondary | ICD-10-CM

## 2020-03-12 DIAGNOSIS — Z9889 Other specified postprocedural states: Secondary | ICD-10-CM

## 2020-03-12 MED ORDER — PREDNISONE 10 MG (21) PO TBPK: ORAL_TABLET | ORAL | 0 refills | Status: DC

## 2020-03-12 NOTE — Progress Notes (Signed)
Office Visit Note   Patient: Hannah [name]           Date of Birth: Oct 24, 1960           MRN: [id number] Visit Date: 03/12/2020              Requested by: [name], MD [address] [place],  [place] [address] PCP: [name], MD  Chief Complaint  Patient presents with   Right Foot - Pain      HPI: Patient is a 59 year old woman who presents in referral from Dr.Xu for initial evaluation of clawing of the right foot second and fourth toes she states she has no active extension she states that with weightbearing she has pain over the tips of the toes and feels like her gait is unstable from the floppy toes.  Assessment & Plan: Visit Diagnoses:  1. Claw toe, acquired, right     Plan: Discussed that she could proceed with conservative treatment with a wide sneaker versus surgical intervention.  Patient states she would like to proceed with surgical correction.  Would plan for a fusion of the PIP joint of the second and fourth toe and a Weil osteotomy for the second third and fourth metatarsal.  Risks and benefits were discussed postoperative care was discussed patient states she understands and wished to proceed at this time.  Follow-Up Instructions: Return if symptoms worsen or fail to improve, for We will follow-up 1 week after proposed surgery.Manson Passey Exam  Patient is alert, oriented, no adenopathy, well-dressed, normal affect, normal respiratory effort. Examination patient has a good dorsalis pedis pulse she has good range of motion of the ankle and subtalar joint she has a floppy second and fourth toe that are flexed with pressure on the end of the toe from walking she cannot actively extend these toes.  She has a prominent second third and fourth metatarsal head.  There are surgical incisions over the PIP joint of the second and fourth toe and it appears that she had a tendon release same procedure.  The toe flexors are active with flexion of the second and  fourth toe.  Imaging: XR Foot Complete Right  Result Date: 03/12/2020 Three-view radiographs of the right foot shows previous bunion surgery with retained K wire with a short first metatarsal and a long second third and fourth metatarsal.  There is no evidence of fusion of the PIP joints.  No images are attached to the encounter.  Labs: Lab Results  Component Value Date   ESRSEDRATE 53 (H) 09/05/2017   ESRSEDRATE 43 (H) 01/15/2016   CRP 0.6 01/15/2016   LABURIC 7.2 (H) 09/05/2017     Lab Results  Component Value Date   ALBUMIN 3.2 (L) 02/04/2020   ALBUMIN 3.5 10/29/2019   ALBUMIN 3.2 (L) 08/16/2019   LABURIC 7.2 (H) 09/05/2017    No results found for: MG No results found for: VD25OH  No results found for: PREALBUMIN CBC EXTENDED Latest Ref Rng & Units 02/04/2020 10/29/2019 08/16/2019  WBC 4.0 - 10.5 K/uL 9.7 6.8 7.0  RBC 3.87 - 5.11 MIL/uL 4.72 4.91 4.61  HGB 12.0 - 15.0 g/dL 11.2(L) 11.5(L) 11.2(L)  HCT 36 - 46 % 34.2(L) 35.7(L) 34.5(L)  PLT 150 - 400 K/uL 230 199 196  NEUTROABS 1.7 - 7.7 K/uL 6.8 4.2 4.0  LYMPHSABS 0.7 - 4.0 K/uL 2.2 2.2 2.4     Body mass index is 39.06 kg/m.  Orders:  No orders of  the defined types were placed in this encounter.  No orders of the defined types were placed in this encounter.    Procedures: No procedures performed  Clinical Data: No additional findings.  ROS:  All other systems negative, except as noted in the HPI. Review of Systems  Objective: Vital Signs: Ht _0  (1.676 m)    Wt 242 lb (109.8 kg)    LMP 10/22/2016 (Exact Date)    BMI 39.06 kg/m   Specialty Comments:  No specialty comments available.  PMFS History: Patient Active Problem List   Diagnosis Date Noted   Stenosing tenosynovitis of finger of left hand 02/27/2020   Hemoglobin C trait ([facility]) 02/27/2018   Major depression, recurrent ([facility]) 01/12/2018   Chronic right shoulder pain 12/20/2017   Smoldering multiple myeloma ([facility]) 11/28/2017   MDD  (major depressive disorder), recurrent episode, moderate ([facility]) 11/04/2017   MGUS (monoclonal gammopathy of unknown significance) 10/24/2017   Primary osteoarthritis of both hands 09/26/2017   Primary osteoarthritis of right shoulder 09/26/2017   Primary osteoarthritis of both hips 09/26/2017   Status post total knee replacement, right 09/26/2017   Primary osteoarthritis of both feet 09/26/2017   History of asthma 09/26/2017   Primary osteoarthritis of left knee 04/18/2017   Menorrhagia 11/04/2016   Fibroid tumor 11/04/2016   Adenomyosis 11/04/2016   Hypertension, essential 11/04/2016   Total knee replacement status 01/28/2016   Past Medical History:  Diagnosis Date   Allergy    Anemia    Anxiety    Arthritis    Asthma    Depression    Hypertension     Family History  Problem Relation Age of Onset   Cancer Mother        uterine    Asthma Mother    Arthritis Daughter    Depression Maternal Uncle    Colon cancer Neg Hx     Past Surgical History:  Procedure Laterality Date   CHOLECYSTECTOMY  1993   COLONOSCOPY     TOTAL KNEE ARTHROPLASTY Right 01/28/2016   TOTAL KNEE ARTHROPLASTY Right 01/28/2016   Procedure: RIGHT TOTAL KNEE ARTHROPLASTY;  Surgeon: [name], MD;  Location: [facility];  Service: Orthopedics;  Laterality: Right;   TRIGGER FINGER RELEASE Right 06/18/2014   Procedure: RIGHT LONG FINGER TRIGGER RELEASE;  Surgeon: [name], MD;  Location: [facility];  Service: Orthopedics;  Laterality: Right;   TUBAL LIGATION     VAGINAL HYSTERECTOMY Bilateral 11/04/2016   Procedure: HYSTERECTOMY VAGINAL with Bilateral Salpingectomy;  Surgeon: [name], MD;  Location: [facility];  Service: Gynecology;  Laterality: Bilateral;   Social History   Occupational History   Not on file  Tobacco Use   Smoking status: Never Smoker   Smokeless tobacco: Never Used  Vaping Use   Vaping Use: Never used  Substance and  Sexual Activity   Alcohol use: No    Alcohol/week: 0.0 standard drinks   Drug use: No   Sexual activity: Yes    Partners: Male    Birth control/protection: Surgical

## 2020-03-12 NOTE — Progress Notes (Signed)
Office Visit Note   Patient: Hannah [name]           Date of Birth: [birth year]           MRN: [id number] Visit Date: 03/12/2020              Requested by: [name], MD [address] [place],  [place] [address] PCP: [name], MD   Assessment & Plan: Visit Diagnoses:  1. Pain in right toe(s)   2. S/P trigger finger release     Plan: Impression is status post left long trigger finger release.  #2 hammertoes to the second and fourth toes. Regards to trigger finger, sutures were removed and Steri-Strips were applied today. She will avoid any heavy lifting or submerging her hand in water for another 2 weeks. Follow-up with [place] in 4 weeks time for recheck. In regards to her toes, we will refer her to Dr. Sharol Given for further evaluation treatment recommendation.  Follow-Up Instructions: Return in about 4 weeks (around 04/09/2020).   Orders:  Orders Placed This Encounter  Procedures   XR Foot Complete Right   Meds ordered this encounter  Medications   predniSONE (STERAPRED UNI-PAK 21 TAB) 10 MG (21) TBPK tablet    Sig: Take as directed    Dispense:  21 tablet    Refill:  0      Procedures: No procedures performed   Clinical Data: No additional findings.   Subjective: Chief Complaint  Patient presents with   Left Hand - Follow-up    Left long finger release 02/27/2020    HPI patient is a pleasant 59 year old female who comes in today 2 weeks out left long trigger finger release.  She has been doing well.  She still has slight soreness but nothing significant.  She is taking over-the-counter pain medication as needed.  The other issue she brings up today is pain, dragging and popping to her her right foot second and fourth toes.  She has a history of what sounds like fusion to the second toe and possibly the fourth toe.  These have been bothering her and she would like to have these evaluated today.    Review of Systems as detailed in HPI.  All others  reviewed and are negative.   Objective: Vital Signs: Ht 5' 6.5" (1.689 m)    Wt 242 lb (109.8 kg)    LMP 10/22/2016 (Exact Date)    BMI 38.47 kg/m   Physical Exam well-developed well-nourished female no acute distress.  Alert oriented x3.  Ortho Exam left hand exam shows a well-healing surgical incision with nylon sutures in place.  It does look like one of the nylon sutures has come out on its own.  There are no signs of infection or cellulitis.  Fingers are warm and well-perfused. Right foot appears to have hammer toes to the second and fourth toes.  These are non-tender.  She is neurovascularly intact distally.     Specialty Comments:  No specialty comments available.  Imaging: XR Foot Complete Right  Result Date: 03/12/2020 No acute findings    PMFS History: Patient Active Problem List   Diagnosis Date Noted   Stenosing tenosynovitis of finger of left hand 02/27/2020   Hemoglobin C trait ([facility]) 02/27/2018   Major depression, recurrent ([facility]) 01/12/2018   Chronic right shoulder pain 12/20/2017   Smoldering multiple myeloma ([facility]) 11/28/2017   MDD (major depressive disorder), recurrent episode, moderate ([facility]) 11/04/2017   MGUS (monoclonal gammopathy of  unknown significance) 10/24/2017   Primary osteoarthritis of both hands 09/26/2017   Primary osteoarthritis of right shoulder 09/26/2017   Primary osteoarthritis of both hips 09/26/2017   Status post total knee replacement, right 09/26/2017   Primary osteoarthritis of both feet 09/26/2017   History of asthma 09/26/2017   Primary osteoarthritis of left knee 04/18/2017   Menorrhagia 11/04/2016   Fibroid tumor 11/04/2016   Adenomyosis 11/04/2016   Hypertension, essential 11/04/2016   Total knee replacement status 01/28/2016   Past Medical History:  Diagnosis Date   Allergy    Anemia    Anxiety    Arthritis    Asthma    Depression    Hypertension     Family History  Problem Relation Age of  Onset   Cancer Mother        uterine    Asthma Mother    Arthritis Daughter    Depression Maternal Uncle    Colon cancer Neg Hx     Past Surgical History:  Procedure Laterality Date   CHOLECYSTECTOMY  1993   COLONOSCOPY     TOTAL KNEE ARTHROPLASTY Right 01/28/2016   TOTAL KNEE ARTHROPLASTY Right 01/28/2016   Procedure: RIGHT TOTAL KNEE ARTHROPLASTY;  Surgeon: [name], MD;  Location: [facility];  Service: Orthopedics;  Laterality: Right;   TRIGGER FINGER RELEASE Right 06/18/2014   Procedure: RIGHT LONG FINGER TRIGGER RELEASE;  Surgeon: [name], MD;  Location: [facility];  Service: Orthopedics;  Laterality: Right;   TUBAL LIGATION     VAGINAL HYSTERECTOMY Bilateral 11/04/2016   Procedure: HYSTERECTOMY VAGINAL with Bilateral Salpingectomy;  Surgeon: [name], MD;  Location: [facility];  Service: Gynecology;  Laterality: Bilateral;   Social History   Occupational History   Not on file  Tobacco Use   Smoking status: Never Smoker   Smokeless tobacco: Never Used  Vaping Use   Vaping Use: Never used  Substance and Sexual Activity   Alcohol use: No    Alcohol/week: 0.0 standard drinks   Drug use: No   Sexual activity: Yes    Partners: Male    Birth control/protection: Surgical

## 2020-03-17 ENCOUNTER — Telehealth: Payer: Self-pay | Admitting: Orthopaedic Surgery

## 2020-03-17 NOTE — Telephone Encounter (Signed)
[employer] forms received. Sent to [employer] 

## 2020-03-18 ENCOUNTER — Other Ambulatory Visit: Payer: Self-pay | Admitting: Hematology

## 2020-03-18 DIAGNOSIS — C9000 Multiple myeloma not having achieved remission: Secondary | ICD-10-CM

## 2020-03-18 DIAGNOSIS — D472 Monoclonal gammopathy: Secondary | ICD-10-CM

## 2020-03-19 ENCOUNTER — Other Ambulatory Visit: Payer: Self-pay

## 2020-03-19 ENCOUNTER — Inpatient Hospital Stay: Payer: BC Managed Care – PPO | Attending: Hematology

## 2020-03-19 DIAGNOSIS — D472 Monoclonal gammopathy: Secondary | ICD-10-CM

## 2020-03-19 DIAGNOSIS — C9000 Multiple myeloma not having achieved remission: Secondary | ICD-10-CM | POA: Diagnosis not present

## 2020-03-19 DIAGNOSIS — I10 Essential (primary) hypertension: Secondary | ICD-10-CM | POA: Insufficient documentation

## 2020-03-19 DIAGNOSIS — D649 Anemia, unspecified: Secondary | ICD-10-CM | POA: Insufficient documentation

## 2020-03-19 DIAGNOSIS — E669 Obesity, unspecified: Secondary | ICD-10-CM | POA: Diagnosis not present

## 2020-03-19 DIAGNOSIS — F419 Anxiety disorder, unspecified: Secondary | ICD-10-CM | POA: Insufficient documentation

## 2020-03-19 DIAGNOSIS — F329 Major depressive disorder, single episode, unspecified: Secondary | ICD-10-CM | POA: Insufficient documentation

## 2020-03-19 DIAGNOSIS — D582 Other hemoglobinopathies: Secondary | ICD-10-CM | POA: Insufficient documentation

## 2020-03-19 LAB — CBC WITH DIFFERENTIAL (CANCER CENTER ONLY)
Abs Immature Granulocytes: 0.04 10*3/uL (ref 0.00–0.07)
Basophils Absolute: 0 10*3/uL (ref 0.0–0.1)
Basophils Relative: 0 %
Eosinophils Absolute: 0.1 10*3/uL (ref 0.0–0.5)
Eosinophils Relative: 1 %
HCT: 34.5 % — ABNORMAL LOW (ref 36.0–46.0)
Hemoglobin: 11.1 g/dL — ABNORMAL LOW (ref 12.0–15.0)
Immature Granulocytes: 0 %
Lymphocytes Relative: 39 %
Lymphs Abs: 4.1 10*3/uL — ABNORMAL HIGH (ref 0.7–4.0)
MCH: 23 pg — ABNORMAL LOW (ref 26.0–34.0)
MCHC: 32.2 g/dL (ref 30.0–36.0)
MCV: 71.6 fL — ABNORMAL LOW (ref 80.0–100.0)
Monocytes Absolute: 0.6 10*3/uL (ref 0.1–1.0)
Monocytes Relative: 6 %
Neutro Abs: 5.6 10*3/uL (ref 1.7–7.7)
Neutrophils Relative %: 54 %
Platelet Count: 241 10*3/uL (ref 150–400)
RBC: 4.82 MIL/uL (ref 3.87–5.11)
RDW: 16.2 % — ABNORMAL HIGH (ref 11.5–15.5)
WBC Count: 10.5 10*3/uL (ref 4.0–10.5)
nRBC: 0 % (ref 0.0–0.2)

## 2020-03-19 LAB — CMP (CANCER CENTER ONLY)
ALT: 10 U/L (ref 0–44)
AST: 12 U/L — ABNORMAL LOW (ref 15–41)
Albumin: 3.4 g/dL — ABNORMAL LOW (ref 3.5–5.0)
Alkaline Phosphatase: 81 U/L (ref 38–126)
Anion gap: 11 (ref 5–15)
BUN: 23 mg/dL — ABNORMAL HIGH (ref 6–20)
CO2: 31 mmol/L (ref 22–32)
Calcium: 9.4 mg/dL (ref 8.9–10.3)
Chloride: 98 mmol/L (ref 98–111)
Creatinine: 0.87 mg/dL (ref 0.44–1.00)
GFR, Estimated: >60 mL/min (ref >60)
Glucose, Bld: 90 mg/dL (ref 70–99)
Potassium: 3.3 mmol/L — ABNORMAL LOW (ref 3.5–5.1)
Sodium: 140 mmol/L (ref 135–145)
Total Bilirubin: 0.4 mg/dL (ref 0.3–1.2)
Total Protein: 9.3 g/dL — ABNORMAL HIGH (ref 6.5–8.1)

## 2020-03-20 DIAGNOSIS — E669 Obesity, unspecified: Secondary | ICD-10-CM | POA: Diagnosis not present

## 2020-03-20 DIAGNOSIS — I10 Essential (primary) hypertension: Secondary | ICD-10-CM | POA: Diagnosis not present

## 2020-03-20 DIAGNOSIS — F329 Major depressive disorder, single episode, unspecified: Secondary | ICD-10-CM | POA: Diagnosis not present

## 2020-03-20 DIAGNOSIS — C9000 Multiple myeloma not having achieved remission: Secondary | ICD-10-CM | POA: Diagnosis not present

## 2020-03-20 DIAGNOSIS — D582 Other hemoglobinopathies: Secondary | ICD-10-CM | POA: Diagnosis not present

## 2020-03-20 DIAGNOSIS — F419 Anxiety disorder, unspecified: Secondary | ICD-10-CM | POA: Diagnosis not present

## 2020-03-20 DIAGNOSIS — D649 Anemia, unspecified: Secondary | ICD-10-CM | POA: Diagnosis not present

## 2020-03-24 LAB — UPEP/UIFE/LIGHT CHAINS/TP, 24-HR UR
% BETA, Urine: 5.1 %
ALPHA 1 URINE: 1.9 %
Albumin, U: 10.9 %
Alpha 2, Urine: 6.8 %
Free Kappa Lt Chains,Ur: 868.96 mg/L — ABNORMAL HIGH (ref 0.63–113.79)
Free Kappa/Lambda Ratio: 309.24 — ABNORMAL HIGH (ref 1.03–31.76)
Free Lambda Lt Chains,Ur: 2.81 mg/L (ref 0.47–11.77)
GAMMA GLOBULIN URINE: 75.2 %
M-SPIKE %, Urine: 45.1 % — ABNORMAL HIGH
M-Spike, Mg/24 Hr: 145 mg/24 hr — ABNORMAL HIGH
Total Protein, Urine-Ur/day: 322 mg/24 hr — ABNORMAL HIGH (ref 30–150)
Total Protein, Urine: 29.3 mg/dL
Total Volume: 1100

## 2020-03-25 NOTE — Progress Notes (Signed)
[facility]   [phone] [phone]   Clinic Follow up Note   Patient Care Team: Tisovec, Fransico Him, MD as PCP - General (Internal Medicine) Bo Merino, MD as Consulting Physician (Rheumatology)  Date of Service:  03/26/2020  CHIEF COMPLAINT: Follow-upanemia andsmoldering multiple myeloma  CURRENT THERAPY:  Surveillance and oral iron  INTERVAL HISTORY:  Hannah [name] is here for a follow up of MM. She presents to the clinic alone.  She is clinically doing well, denies any new pain.  She has mild back and knee pain which is stable.  She underwent bladder hernia repair in September 2021, and has recovered well.  Her appetite and energy level are good, no other new concerns.  Weight is stable. All other systems were reviewed with the patient and are negative.  MEDICAL HISTORY:  Past Medical History:  Diagnosis Date  . Allergy   . Anemia   . Anxiety   . Arthritis   . Asthma   . Depression   . Hypertension     SURGICAL HISTORY: Past Surgical History:  Procedure Laterality Date  . CHOLECYSTECTOMY  1993  . COLONOSCOPY    . 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 TOTAL KNEE ARTHROPLASTY Right 01/28/2016   Procedure: RIGHT TOTAL KNEE ARTHROPLASTY;  Surgeon: [name], MD;  Location: [facility];  Service: Orthopedics;  Laterality: Right;  . TRIGGER FINGER RELEASE Right 06/18/2014   Procedure: RIGHT LONG FINGER TRIGGER RELEASE;  Surgeon: [name], MD;  Location: [facility];  Service: Orthopedics;  Laterality: Right;  . TUBAL LIGATION    . VAGINAL HYSTERECTOMY Bilateral 11/04/2016   Procedure: HYSTERECTOMY VAGINAL with Bilateral Salpingectomy;  Surgeon: [name], MD;  Location: [facility];  Service: Gynecology;  Laterality: Bilateral;    I have reviewed the social history and family history with the patient and they are unchanged from previous note.  ALLERGIES:  is allergic to sulfur.  MEDICATIONS:    Current Outpatient Medications  Medication Sig Dispense Refill  . budesonide-formoterol (SYMBICORT) 160-4.5 MCG/ACT inhaler Inhale 2 puffs into the lungs daily as needed.    Marland Kitchen buPROPion (WELLBUTRIN XL) 150 MG 24 hr tablet 450 mg daily (150 mg + 300 mg) 30 tablet 0  . chlorthalidone (HYGROTON) 25 MG tablet TAKE 1 TABLET BY MOUTH EVERY DAY FOR HTN  3  . ferrous sulfate 325 (65 FE) MG tablet Take 325 mg by mouth 2 (two) times daily with a meal.    . HYDROcodone-acetaminophen (NORCO) 5-325 MG tablet Take 1 tablet by mouth 3 (three) times daily as needed for moderate pain. 30 tablet 0  . ibuprofen (ADVIL,MOTRIN) 600 MG tablet 600 MG BY MOUTH EVERY 6 HOURS FOR 5 DAYS, THEN EVERY 6 HOURS AS NEEDED FOR PAIN.  DON'T TAKE MOBIC WHILE TAKING IBUPROFEN 60 tablet 0  . KLOR-CON M20 20 MEQ tablet TAKE 1 TABLET (20 MEQ TOTAL) BY MOUTH DAILY. TAKE 1 TAB TWICE DAILY FOR 3 DAYS THEN TAKE 1 TAB DAILY 33 tablet 0  . Multiple Vitamin (MULTIVITAMIN) tablet Take 1 tablet by mouth daily.    . naproxen sodium (ALEVE) 220 MG tablet Take 220 mg by mouth as needed.    . ondansetron (ZOFRAN) 4 MG tablet Take 1 tablet (4 mg total) by mouth every 8 (eight) hours as needed for nausea or vomiting. 40 tablet 0  . predniSONE (STERAPRED UNI-PAK 21 TAB) 10 MG (21) TBPK tablet Take as directed 21 tablet 0  . sertraline (  ZOLOFT) 50 MG tablet Take 1 tablet (50 mg total) by mouth at bedtime. 90 tablet 0  . traZODone (DESYREL) 50 MG tablet Take 1 tablet (50 mg total) by mouth at bedtime. 90 tablet 0  . Vitamin D, Ergocalciferol, (DRISDOL) 50000 units CAPS capsule Take 50,000 Units by mouth every Monday.      No current facility-administered medications for this visit.    PHYSICAL EXAMINATION: ECOG PERFORMANCE STATUS: 0 - Asymptomatic  Vitals:   03/26/20 1305  BP: (!) 126/91  Pulse: (!) 103  Resp: 18  Temp: 97.6 F (36.4 C)  SpO2: 96%   Filed Weights   03/26/20 1305  Weight: 237 lb 6.4 oz (107.7 kg)    GENERAL:alert, no  distress and comfortable SKIN: skin color, texture, turgor are normal, no rashes or significant lesions EYES: normal, Conjunctiva are pink and non-injected, sclera clear NECK: supple, thyroid normal size, non-tender, without nodularity LYMPH:  no palpable lymphadenopathy in the cervical, axillary  LUNGS: clear to auscultation and percussion with normal breathing effort HEART: regular rate & rhythm and no murmurs and no lower extremity edema ABDOMEN:abdomen soft, non-tender and normal bowel sounds Musculoskeletal:no cyanosis of digits and no clubbing  NEURO: alert & oriented x 3 with fluent speech, no focal motor/sensory deficits  LABORATORY DATA:  I have reviewed the data as listed CBC Latest Ref Rng & Units 03/19/2020 02/04/2020 10/29/2019  WBC 4.0 - 10.5 K/uL 10.5 9.7 6.8  Hemoglobin 12.0 - 15.0 g/dL 11.1(L) 11.2(L) 11.5(L)  Hematocrit 36 - 46 % 34.5(L) 34.2(L) 35.7(L)  Platelets 150 - 400 K/uL 241 230 199     CMP Latest Ref Rng & Units 03/19/2020 02/04/2020 10/29/2019  Glucose 70 - 99 mg/dL 90 97 91  BUN 6 - 20 mg/dL 23(H) 17 12  Creatinine 0.44 - 1.00 mg/dL 0.87 0.88 0.82  Sodium 135 - 145 mmol/L 140 138 141  Potassium 3.5 - 5.1 mmol/L 3.3(L) 3.9 3.5  Chloride 98 - 111 mmol/L 98 100 100  CO2 22 - 32 mmol/L _0 Calcium 8.9 - 10.3 mg/dL 9.4 9.4 9.1  Total Protein 6.5 - 8.1 g/dL 9.3(H) 9.0(H) 8.3(H)  Total Bilirubin 0.3 - 1.2 mg/dL 0.4 0.5 0.4  Alkaline Phos 38 - 126 U/L 81 70 78  AST 15 - 41 U/L 12(L) 10(L) 13(L)  ALT 0 - 44 U/L _1 RADIOGRAPHIC STUDIES: I have personally reviewed the radiological images as listed and agreed with the findings in the report. No results found.   ASSESSMENT & PLAN:  Hannah [name] is a 59 y.o. female with    1. Smoldering multiple myeloma, IgA Kappa type -Wepreviouslyreviewed her medical chart including labs and imaging in detail with the patient.  -Herpreviousoutsidetest showed elevated M protein 1.2g/dl, elevated IgA  level, light chain levels were within normal limits and ratio was slightly increased.She does have mild anemia, likely related to iron deficiency, no renal failure, hypercalcemia, neuropathy, and her previous surgery was negative. -Discussed her bone marrow biopsy findingsfrom 10/2017. She has increased plasma (6% on aspirate, 20% by CD 138). I previouslydiscussed with pathologist Dr. Gari Crown, the increased plasma cells in bone marrow is likely between 10 to 20%, and would favorsmolderingmultiple myeloma than MGUS -Also patient does not meet CRAB for MM, she does have arthritis of low back, hip and right shoulder. Ipreviouslyobtaineda pelvic and lumbar MRIwhich was negativefor myeloma involvement.  Her recent bone survey was also negative for lytic bone lesion. -Lab reviewed,  her kappa light chain has significantly increased lately, light chain ratio close to 50.  Her 24-hour urine also showed significant increased kappa light chain level. -We will repeat multiple myeloma panel and IgM level in 2 months. If her light chain ratio >100, she will meet criteria for multiple myeloma and will start her on treatment.  I plan to repeat a bone marrow biopsy at that time   2. 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  -Hemoglobin electrophoresisshowed increase in Hg C, consistent with Hg C trait. I discussed the results with her and informed her that her children are at risk ofhemoglobin C trait, and I encouraged him to be tested. -Anemia overall mild and stable.Continue oral iron BID.   3. HTN, OA, Obesity  -On chlorthalidone for HTN -She has imaging evidence consistent with OA in multiple joints. Pain currently controlled with NSAIDs and tylenol PRN.  -Ipreviouslyoffered to refer her to a weight loss program, but she states that she is trying on her own with diet and exercise. -She will continue to f/u with PCP,  Chiropractor and Ortho.   4. Anxiety, depression -under care of [facility] provider, stable and tolerating zoloft    PLAN: -lab in about 2 months with a phone visit after, to determine if she needs bone marrow biopsy    No problem-specific Assessment & Plan notes found for this encounter.   No orders of the defined types were placed in this encounter.  All questions were answered. The patient knows to call the clinic with any problems, questions or concerns. No barriers to learning was detected. The total time spent in the appointment was 25 minutes.     Truitt Merle, MD 03/26/2020   I, Joslyn Devon, am acting as scribe for Truitt Merle, MD.   I have reviewed the above documentation for accuracy and completeness, and I agree with the above.

## 2020-03-26 ENCOUNTER — Encounter: Payer: Self-pay | Admitting: Orthopaedic Surgery

## 2020-03-26 ENCOUNTER — Telehealth: Payer: Self-pay

## 2020-03-26 ENCOUNTER — Telehealth: Payer: Self-pay | Admitting: Hematology

## 2020-03-26 ENCOUNTER — Encounter: Payer: Self-pay | Admitting: Hematology

## 2020-03-26 ENCOUNTER — Other Ambulatory Visit: Payer: Self-pay

## 2020-03-26 ENCOUNTER — Inpatient Hospital Stay (HOSPITAL_BASED_OUTPATIENT_CLINIC_OR_DEPARTMENT_OTHER): Payer: BC Managed Care – PPO | Admitting: Hematology

## 2020-03-26 VITALS — BP 126/91 | HR 103 | Temp 97.6°F | Resp 18 | Ht 66.0 in | Wt 237.4 lb

## 2020-03-26 DIAGNOSIS — D582 Other hemoglobinopathies: Secondary | ICD-10-CM | POA: Diagnosis not present

## 2020-03-26 DIAGNOSIS — I10 Essential (primary) hypertension: Secondary | ICD-10-CM | POA: Diagnosis not present

## 2020-03-26 DIAGNOSIS — F419 Anxiety disorder, unspecified: Secondary | ICD-10-CM | POA: Diagnosis not present

## 2020-03-26 DIAGNOSIS — D472 Monoclonal gammopathy: Secondary | ICD-10-CM

## 2020-03-26 DIAGNOSIS — C9000 Multiple myeloma not having achieved remission: Secondary | ICD-10-CM | POA: Diagnosis not present

## 2020-03-26 DIAGNOSIS — F329 Major depressive disorder, single episode, unspecified: Secondary | ICD-10-CM | POA: Diagnosis not present

## 2020-03-26 DIAGNOSIS — D649 Anemia, unspecified: Secondary | ICD-10-CM | POA: Diagnosis not present

## 2020-03-26 DIAGNOSIS — E669 Obesity, unspecified: Secondary | ICD-10-CM | POA: Diagnosis not present

## 2020-03-26 NOTE — Telephone Encounter (Signed)
Scheduled appointment per 10/28 los. Spoke to patient who is aware of appointment date and time.

## 2020-03-26 NOTE — Telephone Encounter (Signed)
Patient called in wanting to be seen today for her finger , says It is swollen and feel very tight . Supposed to return to work Monday . Wants to make sure that it is okay to be able to go to work.

## 2020-03-26 NOTE — Telephone Encounter (Signed)
She will send [place] a picture through my chart.if not she will call us back and get appt for tomorrow.

## 2020-03-27 ENCOUNTER — Telehealth: Payer: Self-pay | Admitting: Orthopaedic Surgery

## 2020-03-27 NOTE — Telephone Encounter (Signed)
Pt is requesting to renew her handicap placard

## 2020-03-27 NOTE — Telephone Encounter (Signed)
5 years

## 2020-03-27 NOTE — Telephone Encounter (Signed)
If yes, for how long?

## 2020-03-30 NOTE — Telephone Encounter (Signed)
Ready for pick up. Sent patient a Comptroller.

## 2020-04-08 DIAGNOSIS — Z1231 Encounter for screening mammogram for malignant neoplasm of breast: Secondary | ICD-10-CM | POA: Diagnosis not present

## 2020-04-08 DIAGNOSIS — I10 Essential (primary) hypertension: Secondary | ICD-10-CM | POA: Diagnosis not present

## 2020-04-08 DIAGNOSIS — Z01411 Encounter for gynecological examination (general) (routine) with abnormal findings: Secondary | ICD-10-CM | POA: Diagnosis not present

## 2020-04-08 DIAGNOSIS — B009 Herpesviral infection, unspecified: Secondary | ICD-10-CM | POA: Diagnosis not present

## 2020-04-08 DIAGNOSIS — Z6838 Body mass index (BMI) 38.0-38.9, adult: Secondary | ICD-10-CM | POA: Diagnosis not present

## 2020-04-09 ENCOUNTER — Encounter: Payer: Self-pay | Admitting: Orthopaedic Surgery

## 2020-04-09 ENCOUNTER — Ambulatory Visit (INDEPENDENT_AMBULATORY_CARE_PROVIDER_SITE_OTHER): Payer: BC Managed Care – PPO | Admitting: Orthopaedic Surgery

## 2020-04-09 DIAGNOSIS — M65332 Trigger finger, left middle finger: Secondary | ICD-10-CM

## 2020-04-09 NOTE — Progress Notes (Signed)
Post-Op Visit Note   Patient: Hannah [name]           Date of Birth: 03-Feb-1961           MRN: [id number] Visit Date: 04/09/2020 PCP: [name], MD   Assessment & Plan:  Chief Complaint:  Chief Complaint  Patient presents with  . Left Hand - Pain   Visit Diagnoses:  1. Trigger finger, left middle finger     Plan: [place] is approximately 5 weeks status post left long trigger finger release.  Overall doing well.  Takes Aleve and Tylenol as needed.  Surgical scar is fully healed.  It is nontender.  She has no more triggering or catching of the finger.  She still lacks few degrees of full extension and she is not quite able to touch the fingertip to the palmar crease.  Overall I think this is just a matter of some residual swelling and inflammation which should improve with continued NSAIDs and time.  Reassurance was provided.  Patient will follow-up if she has any issues or concerns.  Follow-Up Instructions: Return if symptoms worsen or fail to improve.   Orders:  No orders of the defined types were placed in this encounter.  No orders of the defined types were placed in this encounter.   Imaging: No results found.  PMFS History: Patient Active Problem List   Diagnosis Date Noted  . Trigger finger, left middle finger 04/09/2020  . Stenosing tenosynovitis of finger of left hand 02/27/2020  . Hemoglobin C trait ([facility]) 02/27/2018  . Major depression, recurrent ([facility]) 01/12/2018  . Chronic right shoulder pain 12/20/2017  . Smoldering multiple myeloma ([facility]) 11/28/2017  . MDD (major depressive disorder), recurrent episode, moderate ([facility]) 11/04/2017  . MGUS (monoclonal gammopathy of unknown significance) 10/24/2017  . Primary osteoarthritis of both hands 09/26/2017  . Primary osteoarthritis of right shoulder 09/26/2017  . Primary osteoarthritis of both hips 09/26/2017  . Status post total knee replacement, right 09/26/2017  . Primary osteoarthritis of both feet  09/26/2017  . History of asthma 09/26/2017  . Primary osteoarthritis of left knee 04/18/2017  . Menorrhagia 11/04/2016  . Fibroid tumor 11/04/2016  . Adenomyosis 11/04/2016  . Hypertension, essential 11/04/2016  . 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 Allergy   . Anemia   . Anxiety   . Arthritis   . Asthma   . Depression   . Hypertension     Family History  Problem Relation Age of Onset  . Cancer Mother        uterine   . Asthma Mother   . Arthritis Daughter   . Depression Maternal Uncle   . Colon cancer Neg Hx     Past Surgical History:  Procedure Laterality Date  . CHOLECYSTECTOMY  1993  . COLONOSCOPY    . 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 TOTAL KNEE ARTHROPLASTY Right 01/28/2016   Procedure: RIGHT TOTAL KNEE ARTHROPLASTY;  Surgeon: [name], MD;  Location: [facility];  Service: Orthopedics;  Laterality: Right;  . TRIGGER FINGER RELEASE Right 06/18/2014   Procedure: RIGHT LONG FINGER TRIGGER RELEASE;  Surgeon: [name], MD;  Location: [facility];  Service: Orthopedics;  Laterality: Right;  . TUBAL LIGATION    . VAGINAL HYSTERECTOMY Bilateral 11/04/2016   Procedure: HYSTERECTOMY VAGINAL with Bilateral Salpingectomy;  Surgeon: [name], MD;  Location: [facility];  Service: Gynecology;  Laterality: Bilateral;  Social History   Occupational History  . Not on file  Tobacco Use  . Smoking status: Never Smoker  . Smokeless tobacco: Never Used  Vaping Use  . Vaping Use: Never used  Substance and Sexual Activity  . Alcohol use: No    Alcohol/week: 0.0 standard drinks  . Drug use: No  . Sexual activity: Yes    Partners: Male    Birth control/protection: Surgical

## 2020-04-16 ENCOUNTER — Telehealth: Payer: Self-pay

## 2020-04-16 NOTE — Telephone Encounter (Signed)
Do you have a blue sheet on this pt?

## 2020-04-16 NOTE — Telephone Encounter (Signed)
Patient called in wanting to get surgery sch with duda . Says she has called multiple times and left vm.

## 2020-04-19 ENCOUNTER — Emergency Department (HOSPITAL_COMMUNITY): Payer: BC Managed Care – PPO

## 2020-04-19 ENCOUNTER — Emergency Department (HOSPITAL_COMMUNITY)
Admission: EM | Admit: 2020-04-19 | Discharge: 2020-04-19 | Disposition: A | Discharge status: Home / Self Care | Payer: BC Managed Care – PPO | Attending: Emergency Medicine | Admitting: Emergency Medicine

## 2020-04-19 ENCOUNTER — Encounter (HOSPITAL_COMMUNITY): Payer: Self-pay | Admitting: Emergency Medicine

## 2020-04-19 DIAGNOSIS — M79605 Pain in left leg: Secondary | ICD-10-CM | POA: Insufficient documentation

## 2020-04-19 DIAGNOSIS — Z79899 Other long term (current) drug therapy: Secondary | ICD-10-CM | POA: Insufficient documentation

## 2020-04-19 DIAGNOSIS — M549 Dorsalgia, unspecified: Secondary | ICD-10-CM | POA: Diagnosis not present

## 2020-04-19 DIAGNOSIS — Z96651 Presence of right artificial knee joint: Secondary | ICD-10-CM | POA: Diagnosis not present

## 2020-04-19 DIAGNOSIS — M5136 Other intervertebral disc degeneration, lumbar region: Secondary | ICD-10-CM | POA: Diagnosis not present

## 2020-04-19 DIAGNOSIS — I10 Essential (primary) hypertension: Secondary | ICD-10-CM | POA: Diagnosis not present

## 2020-04-19 DIAGNOSIS — J45909 Unspecified asthma, uncomplicated: Secondary | ICD-10-CM | POA: Diagnosis not present

## 2020-04-19 DIAGNOSIS — R0781 Pleurodynia: Secondary | ICD-10-CM

## 2020-04-19 DIAGNOSIS — M5441 Lumbago with sciatica, right side: Secondary | ICD-10-CM | POA: Diagnosis not present

## 2020-04-19 DIAGNOSIS — M5432 Sciatica, left side: Secondary | ICD-10-CM | POA: Diagnosis not present

## 2020-04-19 DIAGNOSIS — M545 Low back pain, unspecified: Secondary | ICD-10-CM | POA: Diagnosis not present

## 2020-04-19 MED ORDER — DEXAMETHASONE SODIUM PHOSPHATE 10 MG/ML IJ SOLN
10.0000 mg | Freq: Once | INTRAMUSCULAR | Status: AC
  Administered 2020-04-19: 10 mg via INTRAMUSCULAR
  Filled 2020-04-19: qty 1

## 2020-04-19 NOTE — Discharge Instructions (Addendum)
Use the Voltaren (diclofenac) gel in addition to your other medications.  Follow-up with your orthopedist.  Return here as needed if you have any worsening symptoms.

## 2020-04-19 NOTE — ED Provider Notes (Signed)
[facility] EMERGENCY DEPARTMENT Provider Note   CSN: [id number] Arrival date & time: 04/19/20  1050     History Chief Complaint  Patient presents with  . Back Pain    Hannah [name] is a 59 y.o. female.  Patient is a 59 year old female who presents with back pain.  She has a history of multiple myeloma in remission and arthritis.  She reports with a 1 month history of some worsening pain in her left lower back.  It is worse when she moves or turns over.  She has some radiation down her left leg and some tingling in her left foot which is been going on for about a week.  She says the fourth and fifth toes of her left foot are tingly.  She denies any weakness to the leg.  No fevers.  No loss of bowel or bladder control or difficulty with urination.  No associate abdominal pain.  No recent injuries to her back.  She has been taking Aleve and Tylenol without significant improvement in symptoms.  She also has some pain to her right ribs.  She says is been hurting for about 2 to 3 days.  Hurts when she moves.  There is no shortness of breath.  She thinks she might of pulled something.  No other recent trauma.  No fevers.  No cough or cold symptoms.  No leg pain or swelling.        Past Medical History:  Diagnosis Date  . Allergy   . Anemia   . Anxiety   . Arthritis   . Asthma   . Depression   . Hypertension     Patient Active Problem List   Diagnosis Date Noted  . Trigger finger, left middle finger 04/09/2020  . Stenosing tenosynovitis of finger of left hand 02/27/2020  . 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 Major depression, recurrent ([place]) 01/12/2018  . Chronic right shoulder pain 12/20/2017  . 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 MDD (major depressive disorder), recurrent episode, moderate ([place]) 11/04/2017  . MGUS (monoclonal gammopathy of unknown significance) 10/24/2017  . Primary osteoarthritis of both hands 09/26/2017  . Primary  osteoarthritis of right shoulder 09/26/2017  . Primary osteoarthritis of both hips 09/26/2017  . Status post total knee replacement, right 09/26/2017  . Primary osteoarthritis of both feet 09/26/2017  . History of asthma 09/26/2017  . Primary osteoarthritis of left knee 04/18/2017  . Menorrhagia 11/04/2016  . Fibroid tumor 11/04/2016  . Adenomyosis 11/04/2016  . Hypertension, essential 11/04/2016  . Total knee replacement status 01/28/2016    Past Surgical History:  Procedure Laterality Date  . CHOLECYSTECTOMY  1993  . COLONOSCOPY    . 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 TOTAL KNEE ARTHROPLASTY Right 01/28/2016   Procedure: RIGHT TOTAL KNEE ARTHROPLASTY;  Surgeon: [name], MD;  Location: [facility];  Service: Orthopedics;  Laterality: Right;  . TRIGGER FINGER RELEASE Right 06/18/2014   Procedure: RIGHT LONG FINGER TRIGGER RELEASE;  Surgeon: [name], MD;  Location: [facility];  Service: Orthopedics;  Laterality: Right;  . TUBAL LIGATION    . VAGINAL HYSTERECTOMY Bilateral 11/04/2016   Procedure: HYSTERECTOMY VAGINAL with Bilateral Salpingectomy;  Surgeon: [name], MD;  Location: [facility];  Service: Gynecology;  Laterality: Bilateral;     OB History    Gravida  5   Para  3   Term  3   Preterm  AB  2   Living  3     SAB      TAB  2   Ectopic      Multiple      Live Births  3           Family History  Problem Relation Age of Onset  . Cancer Mother        uterine   . Asthma Mother   . Arthritis Daughter   . Depression Maternal Uncle   . Colon cancer Neg Hx     Social History   Tobacco Use  . Smoking status: Never Smoker  . Smokeless tobacco: Never Used  Vaping Use  . Vaping Use: Never used  Substance Use Topics  . Alcohol use: No    Alcohol/week: 0.0 standard drinks  . Drug use: No    Home Medications Prior to Admission medications   Medication Sig Start Date End Date Taking? Authorizing Provider    budesonide-formoterol (SYMBICORT) 160-4.5 MCG/ACT inhaler Inhale 2 puffs into the lungs daily as needed.    [provider]  buPROPion (WELLBUTRIN XL) 150 MG 24 hr tablet 450 mg daily (150 mg + 300 mg) 07/26/18   Hisada, Elie Goody, MD  chlorthalidone (HYGROTON) 25 MG tablet TAKE 1 TABLET BY MOUTH EVERY DAY FOR HTN 07/05/17   [provider]  ferrous sulfate 325 (65 FE) MG tablet Take 325 mg by mouth 2 (two) times daily with a meal.    [provider]  HYDROcodone-acetaminophen (NORCO) 5-325 MG tablet Take 1 tablet by mouth 3 (three) times daily as needed for moderate pain. 02/25/20   Aundra Dubin, PA-C  ibuprofen (ADVIL,MOTRIN) 600 MG tablet 600 MG BY MOUTH EVERY 6 HOURS FOR 5 DAYS, THEN EVERY 6 HOURS AS NEEDED FOR PAIN.  DON'T TAKE MOBIC WHILE TAKING IBUPROFEN 11/05/16   Haygood, Seymour Bars, MD  KLOR-CON M20 20 MEQ tablet TAKE 1 TABLET (20 MEQ TOTAL) BY MOUTH DAILY. TAKE 1 TAB TWICE DAILY FOR 3 DAYS THEN TAKE 1 TAB DAILY 03/23/18   Truitt Merle, MD  Multiple Vitamin (MULTIVITAMIN) tablet Take 1 tablet by mouth daily.    [provider]  naproxen sodium (ALEVE) 220 MG tablet Take 220 mg by mouth as needed.    [provider]  ondansetron (ZOFRAN) 4 MG tablet Take 1 tablet (4 mg total) by mouth every 8 (eight) hours as needed for nausea or vomiting. 02/25/20   Aundra Dubin, PA-C  predniSONE (STERAPRED UNI-PAK 21 TAB) 10 MG (21) TBPK tablet Take as directed 03/12/20   Aundra Dubin, PA-C  sertraline (ZOLOFT) 50 MG tablet Take 1 tablet (50 mg total) by mouth at bedtime. 08/13/18   Norman Clay, MD  traZODone (DESYREL) 50 MG tablet Take 1 tablet (50 mg total) by mouth at bedtime. 12/13/18   Norman Clay, MD  Vitamin D, Ergocalciferol, (DRISDOL) 50000 units CAPS capsule Take 50,000 Units by mouth every Monday.     [provider]    Allergies    Sulfur  Review of Systems   Review of Systems  Constitutional: Negative for chills, diaphoresis, fatigue  and fever.  HENT: Negative for congestion, rhinorrhea and sneezing.   Eyes: Negative.   Respiratory: Negative for cough, chest tightness and shortness of breath.   Cardiovascular: Positive for chest pain (Right rib pain). Negative for leg swelling.  Gastrointestinal: Negative for abdominal pain, blood in stool, diarrhea, nausea and vomiting.  Genitourinary: Negative for difficulty urinating, flank pain, frequency and  hematuria.  Musculoskeletal: Positive for back pain. Negative for arthralgias.  Skin: Negative for rash.  Neurological: Positive for numbness. Negative for dizziness, speech difficulty, weakness and headaches.    Physical Exam Updated Vital Signs BP (!) 165/94   Pulse 84   Temp 98.2 F (36.8 C) (Oral)   Resp 14   LMP 10/22/2016 (Exact Date)   SpO2 100%   Physical Exam Constitutional:      Appearance: She is well-developed.  HENT:     Head: Normocephalic and atraumatic.  Eyes:     Pupils: Pupils are equal, round, and reactive to light.  Cardiovascular:     Rate and Rhythm: Normal rate and regular rhythm.     Heart sounds: Normal heart sounds.  Pulmonary:     Effort: Pulmonary effort is normal. No respiratory distress.     Breath sounds: Normal breath sounds. No wheezing or rales.  Chest:     Chest wall: Tenderness (Positive tenderness over the right lateral lower ribs, no crepitus or deformity, no external lesions noted.  No associated abdominal pain) present.  Abdominal:     General: Bowel sounds are normal.     Palpations: Abdomen is soft.     Tenderness: There is no abdominal tenderness. There is no guarding or rebound.  Musculoskeletal:        General: Normal range of motion.     Cervical back: Normal range of motion and neck supple.     Comments: Positive tenderness in the left lower lumbar paraspinal area.  There is also pain over the left sciatic nerve area.  Negative straight leg raise bilaterally.  Patellar reflexes symmetric bilaterally.  She does  have some numbness to the lateral side of her left foot.  Otherwise her sensation is intact.  She has normal motor function in the lower extremities.  Pedal pulses are intact.  Lymphadenopathy:     Cervical: No cervical adenopathy.  Skin:    General: Skin is warm and dry.     Findings: No rash.  Neurological:     Mental Status: She is alert and oriented to person, place, and time.     ED Results / Procedures / Treatments   Labs (all labs ordered are listed, but only abnormal results are displayed) Labs Reviewed - No data to display  EKG None  Radiology DG Ribs Unilateral W/Chest Right  Result Date: 04/19/2020 CLINICAL DATA:  Back pain chronic but unbearable at this time. Rib pain at BB marker site X3 weeks. No known injury. Triage notes: Pt arrives POV to ER with chief of low back, she states she does have a history of arthritis and has been usin.*comment was truncated*back pain EXAM: RIGHT RIBS AND CHEST - 3+ VIEW COMPARISON:  None FINDINGS: Normal mediastinum and cardiac silhouette. Normal pulmonary vasculature. No evidence of effusion, infiltrate, or pneumothorax. No acute bony abnormality. Dedicated views of the RIGHT ribs demonstrate displaced fracture or aggressive osseous lesion. IMPRESSION: No acute cardiopulmonary process. No RIGHT rib fracture or aggressive osseous lesion. Electronically Signed   By: Suzy Bouchard M.D.   On: 04/19/2020 13:09   DG Lumbar Spine Complete  Result Date: 04/19/2020 CLINICAL DATA:  Back pain EXAM: LUMBAR SPINE - COMPLETE 4+ VIEW COMPARISON:  None. FINDINGS: Lumbar vertebral body heights and alignment are maintained. Mild lower lumbar disc space narrowing. Mild lower lumbar facet hypertrophy. No evidence of pars break. Cholecystectomy clips. IMPRESSION: Mild lumbar degenerative changes. Electronically Signed   By: Macy Mis M.D.   On:  04/19/2020 13:10    Procedures Procedures (including critical care time)  Medications Ordered in  ED Medications  dexamethasone (DECADRON) injection 10 mg (10 mg Intramuscular Given 04/19/20 1259)    ED Course  I have reviewed the triage vital signs and the nursing notes.  Pertinent labs & imaging results that were available during my care of the patient were reviewed by me and considered in my medical decision making (see chart for details).    MDM Rules/Calculators/A&P                          Patient is a 59 year old female who presents primarily with back pain.  She has radicular symptoms but no weakness or other suggestions of cauda equina.  No associate abdominal pain.  No fevers.  No trauma.  However given her past history of multiple myeloma, x-rays were performed which showed no acute abnormality.  She does have some degenerative changes.  She is neurologically intact.  She was given a dose of Decadron in the ED.  She declines need for any further pain medication.  She will use over-the-counter medications at home along with diclofenac gel.  She has an orthopedist that she will follow up with next week.  She potentially will need an MRI given her associated numbness.  Return precautions were given.  She also had some pain in her right ribs.  Its reproducible on palpation.  X-rays do not reveal any underlying pathology.  Her lungs are clear.  She has no hypoxia or significant pleuritic symptoms.  She says it gets worse only if she takes a really deep breath but not with normal breathing.  It is mostly worse with movement.  She has no hypoxia, tachycardia, leg swelling or other symptoms that would be more concerning for PE.  This is likely musculoskeletal.  She was discharged home in good condition.  Return precautions were given. Final Clinical Impression(s) / ED Diagnoses Final diagnoses:  Sciatica of left side  Rib pain on right side    Rx / DC Orders ED Discharge Orders    None       Malvin Johns, MD 04/19/20 1324

## 2020-04-19 NOTE — ED Triage Notes (Addendum)
Pt arrives POV to ER with chief of low back, she states she does have a history of arthritis and has been using extra strength tylenol & ibuprofen, with only mild relief. Over the last one week she has also noticed pain into left hip and numbness in 3-5th toes on left foot. She is having trouble with getting up from sitting position to standing due to pain and trouble sleeping due to unable to find a comfortable position.

## 2020-04-19 NOTE — ED Notes (Signed)
Off floor to xray.

## 2020-04-28 ENCOUNTER — Other Ambulatory Visit: Payer: Self-pay

## 2020-04-28 ENCOUNTER — Ambulatory Visit (INDEPENDENT_AMBULATORY_CARE_PROVIDER_SITE_OTHER): Payer: BC Managed Care – PPO | Admitting: Orthopaedic Surgery

## 2020-04-28 ENCOUNTER — Encounter: Payer: Self-pay | Admitting: Orthopaedic Surgery

## 2020-04-28 ENCOUNTER — Telehealth: Payer: Self-pay | Admitting: Orthopaedic Surgery

## 2020-04-28 DIAGNOSIS — M5442 Lumbago with sciatica, left side: Secondary | ICD-10-CM | POA: Diagnosis not present

## 2020-04-28 DIAGNOSIS — G8929 Other chronic pain: Secondary | ICD-10-CM | POA: Diagnosis not present

## 2020-04-28 MED ORDER — PREDNISONE 10 MG (21) PO TBPK: ORAL_TABLET | ORAL | 0 refills | Status: DC

## 2020-04-28 MED ORDER — METHOCARBAMOL 500 MG PO TABS: 500.0000 mg | ORAL_TABLET | Freq: Two times a day (BID) | ORAL | 0 refills | Status: DC | PRN

## 2020-04-28 NOTE — Telephone Encounter (Signed)
Patient submitted medical release form, [employer] short term disability, and $25.00 check payment to [employer]. Accepted 04/28/20

## 2020-04-28 NOTE — Progress Notes (Signed)
 Office Visit Note   Patient: Hannah [name]           Date of Birth: 07/08/1960           MRN: [id number] Visit Date: 04/28/2020              Requested by: Tisovec, Richard W, MD [address] [place],  [place] [address] PCP: Tisovec, Richard W, MD   Assessment & Plan: Visit Diagnoses:  1. Chronic bilateral low back pain with left-sided sciatica     Plan: Impression is left lower extremity sciatica.  We have discussed starting a Sterapred taper, muscle relaxer and referring the patient to physical therapy for hydrotherapy.  She will follow up with us once has been completed.  Total face to face encounter time was greater than 25 minutes and over half of this time was spent in counseling and/or coordination of care.  Follow-Up Instructions: Return for after MRI.   Orders:  Orders Placed This Encounter  Procedures  . MR Lumbar Spine w/o contrast  . Ambulatory referral to Physical Therapy  . Ambulatory referral to Physical Therapy   Meds ordered this encounter  Medications  . predniSONE (STERAPRED UNI-PAK 21 TAB) 10 MG (21) TBPK tablet    Sig: Take as directed    Dispense:  21 tablet    Refill:  0  . methocarbamol (ROBAXIN) 500 MG tablet    Sig: Take 1 tablet (500 mg total) by mouth 2 (two) times daily as needed.    Dispense:  20 tablet    Refill:  0      Procedures: No procedures performed   Clinical Data: No additional findings.   Subjective: Chief Complaint  Patient presents with  . Lower Back - Pain    HPI patient is a 59-year-old female who comes in today for a follow-up from the ED.  She has recently been experiencing bilateral lower back pain primarily on the left with radiation down the left leg and into her foot.  This has been ongoing for over a month.  X-rays in the ED were unremarkable.  She was given a Decadron injection which was temporary.  According to the ED note she declined any further medication for pain.  She is here with continued pain  which is aggravated with walking and bending over.  She denies any weakness to either lower extremity.  No bowel or bladder dysfunction.  She does note tingling to her left foot.  Review of Systems as detailed in HPI.  All others reviewed and are negative.   Objective: Vital Signs: LMP 10/22/2016 (Exact Date)   Physical Exam well-developed well-nourished female no acute distress.  Alert oriented x3.  Ortho Exam lumbar spine shows no spinous or paraspinous tenderness.  She does have increased pain with lumbar flexion.  No pain with extension.  Mildly positive straight leg raise on the left.  Negative logroll negative FADIR.  No focal weakness.  She is neurovascular intact distally.  Specialty Comments:  No specialty comments available.  Imaging: No new imaging   PMFS History: Patient Active Problem List   Diagnosis Date Noted  . Trigger finger, left middle finger 04/09/2020  . Stenosing tenosynovitis of finger of left hand 02/27/2020  . Hemoglobin C trait ([facility]) 02/27/2018  . Major depression, recurrent ([facility]) 01/12/2018  . Chronic right shoulder pain 12/20/2017  . Smoldering multiple myeloma ([facility]) 11/28/2017  . MDD (major depressive disorder), recurrent episode, moderate ([facility]) 11/04/2017  . MGUS (monoclonal gammopathy of unknown   significance) 10/24/2017  . Primary osteoarthritis of both hands 09/26/2017  . Primary osteoarthritis of right shoulder 09/26/2017  . Primary osteoarthritis of both hips 09/26/2017  . Status post total knee replacement, right 09/26/2017  . Primary osteoarthritis of both feet 09/26/2017  . History of asthma 09/26/2017  . Primary osteoarthritis of left knee 04/18/2017  . Menorrhagia 11/04/2016  . Fibroid tumor 11/04/2016  . Adenomyosis 11/04/2016  . Hypertension, essential 11/04/2016  . 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 Allergy   . Anemia   . Anxiety   . Arthritis   . Asthma   . Depression   .  Hypertension     Family History  Problem Relation Age of Onset  . Cancer Mother        uterine   . Asthma Mother   . Arthritis Daughter   . Depression Maternal Uncle   . Colon cancer Neg Hx     Past Surgical History:  Procedure Laterality Date  . CHOLECYSTECTOMY  1993  . COLONOSCOPY    . 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 TOTAL KNEE ARTHROPLASTY Right 01/28/2016   Procedure: RIGHT TOTAL KNEE ARTHROPLASTY;  Surgeon: Naiping M Xu, MD;  Location: [facility];  Service: Orthopedics;  Laterality: Right;  . TRIGGER FINGER RELEASE Right 06/18/2014   Procedure: RIGHT LONG FINGER TRIGGER RELEASE;  Surgeon: Naiping Michael Xu, MD;  Location: [facility];  Service: Orthopedics;  Laterality: Right;  . TUBAL LIGATION    . VAGINAL HYSTERECTOMY Bilateral 11/04/2016   Procedure: HYSTERECTOMY VAGINAL with Bilateral Salpingectomy;  Surgeon: Haygood, Vanessa P, MD;  Location: [facility];  Service: Gynecology;  Laterality: Bilateral;   Social History   Occupational History  . Not on file  Tobacco Use  . Smoking status: Never Smoker  . Smokeless tobacco: Never Used  Vaping Use  . Vaping Use: Never used  Substance and Sexual Activity  . Alcohol use: No    Alcohol/week: 0.0 standard drinks  . Drug use: No  . Sexual activity: Yes    Partners: Male    Birth control/protection: Surgical       

## 2020-04-29 ENCOUNTER — Other Ambulatory Visit: Payer: BC Managed Care – PPO

## 2020-05-06 ENCOUNTER — Ambulatory Visit: Payer: BC Managed Care – PPO | Admitting: Hematology

## 2020-05-08 ENCOUNTER — Encounter: Payer: Self-pay | Admitting: Orthopaedic Surgery

## 2020-05-19 ENCOUNTER — Other Ambulatory Visit: Payer: Self-pay

## 2020-05-19 ENCOUNTER — Ambulatory Visit: Payer: BC Managed Care – PPO | Attending: Orthopaedic Surgery | Admitting: Physical Therapy

## 2020-05-19 DIAGNOSIS — R262 Difficulty in walking, not elsewhere classified: Secondary | ICD-10-CM | POA: Diagnosis not present

## 2020-05-19 DIAGNOSIS — M5442 Lumbago with sciatica, left side: Secondary | ICD-10-CM | POA: Insufficient documentation

## 2020-05-19 DIAGNOSIS — G8929 Other chronic pain: Secondary | ICD-10-CM

## 2020-05-19 DIAGNOSIS — R252 Cramp and spasm: Secondary | ICD-10-CM | POA: Insufficient documentation

## 2020-05-19 DIAGNOSIS — M6281 Muscle weakness (generalized): Secondary | ICD-10-CM | POA: Diagnosis not present

## 2020-05-19 DIAGNOSIS — M25652 Stiffness of left hip, not elsewhere classified: Secondary | ICD-10-CM

## 2020-05-19 NOTE — Patient Instructions (Addendum)
Trigger Point Dry Needling   What is Trigger Point Dry Needling (DN)? o DN is a physical therapy technique used to treat muscle pain and dysfunction. Specifically, DN helps deactivate muscle trigger points (muscle knots).  o A thin filiform needle is used to penetrate the skin and stimulate the underlying trigger point. The goal is for a local twitch response (LTR) to occur and for the trigger point to relax. No medication of any kind is injected during the procedure.    What Does Trigger Point Dry Needling Feel Like?  o The procedure feels different for each individual patient. Some patients report that they do not actually feel the needle enter the skin and overall the process is not painful. Very mild bleeding may occur. However, many patients feel a deep cramping in the muscle in which the needle was inserted. This is the local twitch response.    How Will I feel after the treatment? o Soreness is normal, and the onset of soreness may not occur for a few hours. Typically this soreness does not last longer than two days.  o Bruising is uncommon, however; ice can be used to decrease any possible bruising.  o In rare cases feeling tired or nauseous after the treatment is normal. In addition, your symptoms may get worse before they get better, this period will typically not last longer than 24 hours.    What Can I do After My Treatment? o Increase your hydration by drinking more water for the next 24 hours. o You may place ice or heat on the areas treated that have become sore, however, do not use heat on inflamed or bruised areas. Heat often brings more relief post needling. o You can continue your regular activities, but vigorous activity is not recommended initially after the treatment for 24 hours. o DN is best combined with other physical therapy such as strengthening, stretching, and other therapies.   Aquatic Therapy: What to Expect!  Where:  [facility]           NOTE: You will receive an automated phone message [place]           reminding you of your appointment and it will say the  [place], [place]  [id number]           appointment is at the [facility].  We are  [phone]             working to fix this - just know that you will meet [place] at                pool!   How to Prepare:  Please make sure you drink 8 ounces of water about one hour prior to your pool session  A caregiver may attend if needed with the patient to help assist as needed. A caregiver can sit in the bleachers next to the pool.  Please arrive IN YOUR SUIT and 15 minutes prior to your appointment - this helps to avoid delays in starting your session.  Please make sure to attend to any toileting needs prior to entering the pool  [facility] rooms for changing are located to the right of the check -in desk.  There is direct access to the pool deck form the locker room.  You can lock your belongings in a locker but you must bring you own lock.  Once on the pool deck your therapist will ask you to sign  the Patient  Consent and Assignment of Benefits form  Your therapist may take your blood pressure prior to, during and after your session if indicated  We usually try and create a home exercise program based on activities we do in the pool.  Please be thinking about who might be able to assist you in the pool should you need to participate in an aquatic home exercise program at the time of discharge if you need assistance.  Some patients do not want to or do not have the ability to participate in an aquatic home program - this is not a barrier in any way to you participating in aquatic therapy as part of your current therapy plan!  After Discharge from PT, you can continue using home program at  the [facility], there is a drop-in fee for $5 ($45 a month)or for 60 years  or older $4.00 ($40 a month for seniors )    About the  pool: 1. Entering the pool Your therapist will assist you; there are multiple ways to enter including stairs with railings, a walk in ramp, a roll in chair and a mechanical lift. Your therapist will determine the most appropriate way for you. 2. Water temperature is usually between 86-87 degrees 3. There may be other swimmers in the pool at the same time 4. There is availability at the pool for     Contact Info:             Appointments: Please call the [facility] if you need to cancel or reschedule an appointment. [place]   [place], [place]            All sessions are 45 minutes      [place], PT, [facility] Certified Exercise Expert for the Aging Adult  05/19/20 2:15 PM Phone: [phone] Fax: [phone]

## 2020-05-19 NOTE — Therapy (Signed)
[place], [place], [address] Phone: [phone]   Fax:  [phone]  Physical Therapy Evaluation  Patient Details  Name: Hannah [name] MRN: [id number] Date of Birth: May 25, 1961 Referring Provider (PT): Frankey Shown MD   Encounter Date: 05/19/2020   PT End of Session - 05/19/20 1406    Visit Number 1    Number of Visits 12    Date for PT Re-Evaluation 06/30/20    Authorization Type BCBS    PT Start Time 1330    PT Stop Time 1425    PT Time Calculation (min) 55 min    Activity Tolerance Patient limited by pain    Behavior During Therapy [facility] for tasks assessed/performed           Past Medical History:  Diagnosis Date  . Allergy   . Anemia   . Anxiety   . Arthritis   . Asthma   . Depression   . Hypertension     Past Surgical History:  Procedure Laterality Date  . CHOLECYSTECTOMY  1993  . COLONOSCOPY    . 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 TOTAL KNEE ARTHROPLASTY Right 01/28/2016   Procedure: RIGHT TOTAL KNEE ARTHROPLASTY;  Surgeon: [name], MD;  Location: [place];  Service: Orthopedics;  Laterality: Right;  . TRIGGER FINGER RELEASE Right 06/18/2014   Procedure: RIGHT LONG FINGER TRIGGER RELEASE;  Surgeon: [name], MD;  Location: [place];  Service: Orthopedics;  Laterality: Right;  . TUBAL LIGATION    . VAGINAL HYSTERECTOMY Bilateral 11/04/2016   Procedure: HYSTERECTOMY VAGINAL with Bilateral Salpingectomy;  Surgeon: [name], MD;  Location: [facility];  Service: Gynecology;  Laterality: Bilateral;    There were no vitals filed for this visit.    Subjective Assessment - 05/19/20 1338    Subjective I have had back pain increasing for last 75month with sciatica. I have had an injection and prednisone patches which only helped a little  only with the sciatic nerve pain but not the back pain. I only sleep 4 hours at night    Pertinent History R TKA   01-28-2016  bil trigger release  , hysterectomy,   bil shld OA Left knee OA   HBP    How long can you sit comfortably? 30 minutes and must move.  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 10 minutes    How long can you walk comfortably? not much walking    Diagnostic tests x rays    Patient Stated Goals I have trouble getting up and down steps trouble sleeping    Currently in Pain? Yes    Pain Score 9     Pain Location Back    Pain Orientation Lower;Left;Right    Pain Descriptors / Indicators Numbness    Pain Radiating Towards radiates down legs sometimes left and right  On LEft side 3 lateral toes are numb              [facility] PT Assessment - 05/19/20 0001      Assessment   Medical Diagnosis Chronic LBP with left side sciatica    Referring Provider (PT) XFrankey ShownMD    Onset Date/Surgical Date --   approx 6 months of LBP increasing   Hand Dominance Right    [facility]      Precautions  Precautions None      Restrictions   Weight Bearing Restrictions No      Balance Screen   Has the patient fallen in the past 6 months No    Has the patient had a decrease in activity level because of a fear of falling?  No    Is the patient reluctant to leave their home because of a fear of falling?  No      Home Environment   Living Environment Private residence    Additional Comments Pt states she avoids steps due to pain in Left LB      Prior Function   Level of Independence Independent      Cognition   Overall Cognitive Status Within Functional Limits for tasks assessed      Observation/Other Assessments   Focus on Therapeutic Outcomes (FOTO)  Intake 28%  predicted 45 %      Sensation   Light Touch Appears Intact      Functional Tests   Functional tests Sit to Stand      Sit to Stand   Comments 5 x STS 23.6 sec      Posture/Postural Control   Posture/Postural Control Postural  limitations    Postural Limitations Rounded Shoulders;Forward head;Flexed trunk    Posture Comments increased abdominal girth      ROM / Strength   AROM / PROM / Strength AROM;Strength      AROM   Overall AROM  Deficits    Right Hip Flexion 115    Right Hip External Rotation  45    Right Hip Internal Rotation  40    Left Hip Flexion 108    Left Hip External Rotation  23   pain   Left Hip Internal Rotation  25   pain   Lumbar Flexion 25%   pain on SI joint   Lumbar Extension 25%    Lumbar - Right Side Bend 15%   pain on left SI   Lumbar - Left Side Bend 50%    Lumbar - Right Rotation 75%    Lumbar - Left Rotation 75%      Strength   Overall Strength Deficits    Right Hip Flexion 4/5    Right Hip Extension 4-/5    Right Hip ABduction 3-/5    Left Hip Flexion 4-/5    Left Hip Extension 4-/5    Left Hip ABduction 3-/5    Right Knee Flexion 4/5    Right Knee Extension 4+/5    Left Knee Flexion 4/5    Left Knee Extension 4/5      Palpation   SI assessment  TTP over inferior angle SI    Palpation comment TTP over Left gluteals and lumbar/SI specifically pirifomis distal end and SI Left inferior angle      Special Tests    Special Tests Sacrolliac Tests;Lumbar      FABER test   findings Positive    Side LEft      Straight Leg Raise   Findings Positive    Side  Left      Pelvic Dictraction   Findings Positive    Side  Left      Sacral thrust    Findings Positive    Side Left      Sacral Compression   Findings Positive    Side  Left      Transfers   Comments Pt with OA of RT shld and cannot bear wt for x  fers                      Objective measurements completed on examination: See above findings.         Trigger Point Dry Needling - 05/19/20 0001    Consent Given? Yes    Education Handout Provided Yes    Muscles Treated Back/Hip Piriformis;Lumbar multifidi    Dry Needling Comments 75 mm .30 gage    Piriformis Response Twitch response  elicited;Palpable increased muscle length   left   Lumbar multifidi Response Twitch response elicited;Palpable increased muscle length   L5 S-1               PT Education - 05/19/20 1415    Education Details POC Explanation of findings  intiial Hep  TPDN education  went over FOTO report    Person(s) Educated Patient    Methods Explanation;Demonstration;Tactile cues;Verbal cues;Handout    Comprehension Verbalized understanding;Returned demonstration            PT Short Term Goals - 05/19/20 1432      PT SHORT TERM GOAL #1   Title STG=LTG             PT Long Term Goals - 05/19/20 1432      PT LONG TERM GOAL #1   Title Pt will be independent with all HEP given    Baseline No knowledge    Time 6    Period Weeks    Status New    Target Date 06/30/20      PT LONG TERM GOAL #2   Title Pt will be able to perform household chores without exacerbating back pain and using pain managment techniques    Baseline Pt unable to sweep or wash dishes for longer than 10 minutes    Time 6    Period Weeks    Status New    Target Date 06/30/20      PT LONG TERM GOAL #3   Title Pt will be able to perform a 5 x sts in 13 sec or less showing improved LE strength and control without pain    Baseline 5 x STS  23.6 sec    Time 6    Period Weeks    Status New    Target Date 06/30/20      PT LONG TERM GOAL #4   Title FOTO will improve from 28 %   to  45 %   indicating improved functional mobility .    Baseline eval 285    Time 6    Period Weeks    Status New    Target Date 06/30/20      PT LONG TERM GOAL #5   Title Pt will be able to negotiate steps with step over step technique and carrying 20 # of groceries    Baseline unable to negotiate steps , avoids due to pain and does not carry groceries due to pain    Time 6    Period Weeks    Status New    Target Date 06/30/20                  Plan - 05/19/20 1452    Clinical Impression Statement 59 yo female presents  with chronic LBP and LLE radiating pain consistent with likely radicular etiology, but also having specific Left SI joint pain and positive provocation with SI tests. Pt with 9/10 pain and was having difficulty with transfers and rolling  on clinic table.   Ms Berkery consented to [facility] for Left piriformis and L5 S1 TPDN and had immediate decrease in pain and spasm.  Pt was closely monitored throughout session.  Pt has been having 6 month history of low back pain bil with LLE radiating into Left foot and with lateral 3 digits with numbness.  Ms Ast walks with an antalgic gait and decreased stride length. Pt also with decreased AROM of IR/ER or left hip.  Pt used to do water aerobics and since she has several joint affected with OA is a prime candidate for aquatic therapy.  Her Right shoulder has severe OA and she reports she may get shoulder replacedment.  He presentsly had R TKA in which she had successful recovery.  Pt will benefit from skilled PT to address impairments and  return pt to maximize function to return to job and return to some form of exercise for health.    Personal Factors and Comorbidities Comorbidity 1    Comorbidities R TKA  01-28-2016  bil trigger release  , hysterectomy,   bil shld OA Left knee OA   HBP    Examination-Activity Limitations Sleep;Squat;Stand;Stairs;Transfers;Locomotion Level;Sit    Examination-Participation Restrictions Occupation    Stability/Clinical Decision Making Evolving/Moderate complexity    Clinical Decision Making Moderate    Rehab Potential Good    PT Frequency 2x / week    PT Duration 6 weeks    PT Treatment/Interventions ADLs/Self Care Home Management;Aquatic Therapy;Cryotherapy;Electrical Stimulation;Moist Heat;Iontophoresis 70m/ml Dexamethasone;Gait training;Stair training;Functional mobility training;Therapeutic activities;Therapeutic exercise;Balance training;Neuromuscular re-education;Manual techniques;Patient/family education;Passive range of  motion;Dry needling;Taping;Joint Manipulations    PT Next Visit Plan Assess TPDN  basic back work on sciatic nerve glides   Check SI    PT Home Exercise Plan [email or web address]    Consulted and Agree with Plan of Care Patient           Patient will benefit from skilled therapeutic intervention in order to improve the following deficits and impairments:  Obesity,Pain,Postural dysfunction,Improper body mechanics,Difficulty walking,Decreased mobility,Decreased range of motion,Decreased strength,Hypomobility,Impaired flexibility,Increased muscle spasms  Visit Diagnosis: Chronic low back pain with left-sided sciatica, unspecified back pain laterality  Stiffness of left hip, not elsewhere classified  Muscle weakness (generalized)  Cramp and spasm  Difficulty in walking, not elsewhere classified   Access Code: [email or web address]: [email or web address]: 12/21/2021Prepared by: LDonnetta SimpersBeardsleyExercises  Supine Figure 4 Piriformis Stretch - 1 x daily - 7 x weekly - 1 sets - 3 reps - 30 sec hold  Supine Piriformis Stretch - 1 x daily - 7 x weekly - 3 sets - 3 reps - 20-30 sec hold  Sit to Stand with Counter Support - 1 x daily - 7 x weekly - 3 sets - 10 reps    Problem List Patient Active Problem List   Diagnosis Date Noted  . Trigger finger, left middle finger 04/09/2020  . Stenosing tenosynovitis of finger of left hand 02/27/2020  . Hemoglobin C trait ([facility] 02/27/2018  . Major depression, recurrent ([facility] 01/12/2018  . Chronic right shoulder pain 12/20/2017  . Smoldering multiple myeloma ([facility] 11/28/2017  . MDD (major depressive disorder), recurrent episode, moderate ([facility] 11/04/2017  . MGUS (monoclonal gammopathy of unknown significance) 10/24/2017  . Primary osteoarthritis of both hands 09/26/2017  . Primary osteoarthritis of right shoulder 09/26/2017  . Primary osteoarthritis of both hips 09/26/2017  . Status post total knee replacement, right 09/26/2017  . Primary  osteoarthritis of both feet 09/26/2017  . History of asthma 09/26/2017  . Primary osteoarthritis  of left knee 04/18/2017  . Menorrhagia 11/04/2016  . Fibroid tumor 11/04/2016  . Adenomyosis 11/04/2016  . Hypertension, essential 11/04/2016  . Total knee replacement status 01/28/2016   Voncille Lo, PT, [place] Certified Exercise Expert for the Aging Adult  05/19/20 3:03 PM Phone: [phone] Fax: [facility] [address] [place], [place], [address] Phone: [phone]   Fax:  [phone]  Name: Hannah [name] MRN: [id number] Date of Birth: 1961-04-26

## 2020-05-21 ENCOUNTER — Other Ambulatory Visit: Payer: Self-pay | Admitting: Physician Assistant

## 2020-06-01 ENCOUNTER — Other Ambulatory Visit: Payer: Self-pay

## 2020-06-01 ENCOUNTER — Inpatient Hospital Stay: Payer: BC Managed Care – PPO | Attending: Hematology

## 2020-06-01 DIAGNOSIS — D582 Other hemoglobinopathies: Secondary | ICD-10-CM | POA: Insufficient documentation

## 2020-06-01 DIAGNOSIS — C9000 Multiple myeloma not having achieved remission: Secondary | ICD-10-CM | POA: Insufficient documentation

## 2020-06-01 DIAGNOSIS — D649 Anemia, unspecified: Secondary | ICD-10-CM | POA: Insufficient documentation

## 2020-06-01 DIAGNOSIS — F329 Major depressive disorder, single episode, unspecified: Secondary | ICD-10-CM | POA: Insufficient documentation

## 2020-06-01 DIAGNOSIS — F419 Anxiety disorder, unspecified: Secondary | ICD-10-CM | POA: Diagnosis not present

## 2020-06-01 DIAGNOSIS — E669 Obesity, unspecified: Secondary | ICD-10-CM | POA: Diagnosis not present

## 2020-06-01 DIAGNOSIS — I10 Essential (primary) hypertension: Secondary | ICD-10-CM | POA: Diagnosis not present

## 2020-06-01 DIAGNOSIS — D472 Monoclonal gammopathy: Secondary | ICD-10-CM

## 2020-06-01 LAB — CMP (CANCER CENTER ONLY)
ALT: 24 U/L (ref 0–44)
AST: 19 U/L (ref 15–41)
Albumin: 3.1 g/dL — ABNORMAL LOW (ref 3.5–5.0)
Alkaline Phosphatase: 74 U/L (ref 38–126)
Anion gap: 9 (ref 5–15)
BUN: 15 mg/dL (ref 6–20)
CO2: 32 mmol/L (ref 22–32)
Calcium: 9.5 mg/dL (ref 8.9–10.3)
Chloride: 101 mmol/L (ref 98–111)
Creatinine: 0.82 mg/dL (ref 0.44–1.00)
GFR, Estimated: >60 mL/min (ref >60)
Glucose, Bld: 104 mg/dL — ABNORMAL HIGH (ref 70–99)
Potassium: 3.5 mmol/L (ref 3.5–5.1)
Sodium: 142 mmol/L (ref 135–145)
Total Bilirubin: 0.3 mg/dL (ref 0.3–1.2)
Total Protein: 9.6 g/dL — ABNORMAL HIGH (ref 6.5–8.1)

## 2020-06-01 LAB — CBC WITH DIFFERENTIAL (CANCER CENTER ONLY)
Abs Immature Granulocytes: 0.05 10*3/uL (ref 0.00–0.07)
Basophils Absolute: 0 10*3/uL (ref 0.0–0.1)
Basophils Relative: 0 %
Eosinophils Absolute: 0.1 10*3/uL (ref 0.0–0.5)
Eosinophils Relative: 1 %
HCT: 33.2 % — ABNORMAL LOW (ref 36.0–46.0)
Hemoglobin: 10.7 g/dL — ABNORMAL LOW (ref 12.0–15.0)
Immature Granulocytes: 1 %
Lymphocytes Relative: 31 %
Lymphs Abs: 2.2 10*3/uL (ref 0.7–4.0)
MCH: 23.2 pg — ABNORMAL LOW (ref 26.0–34.0)
MCHC: 32.2 g/dL (ref 30.0–36.0)
MCV: 72 fL — ABNORMAL LOW (ref 80.0–100.0)
Monocytes Absolute: 0.3 10*3/uL (ref 0.1–1.0)
Monocytes Relative: 5 %
Neutro Abs: 4.6 10*3/uL (ref 1.7–7.7)
Neutrophils Relative %: 62 %
Platelet Count: 216 10*3/uL (ref 150–400)
RBC: 4.61 MIL/uL (ref 3.87–5.11)
RDW: 17 % — ABNORMAL HIGH (ref 11.5–15.5)
WBC Count: 7.3 10*3/uL (ref 4.0–10.5)
nRBC: 0 % (ref 0.0–0.2)

## 2020-06-02 LAB — KAPPA/LAMBDA LIGHT CHAINS
Kappa free light chain: 376.4 mg/L — ABNORMAL HIGH (ref 3.3–19.4)
Kappa, lambda light chain ratio: 87.53 — ABNORMAL HIGH (ref 0.26–1.65)
Lambda free light chains: 4.3 mg/L — ABNORMAL LOW (ref 5.7–26.3)

## 2020-06-03 ENCOUNTER — Other Ambulatory Visit: Payer: Self-pay

## 2020-06-03 ENCOUNTER — Ambulatory Visit: Payer: BC Managed Care – PPO | Attending: Orthopaedic Surgery | Admitting: Physical Therapy

## 2020-06-03 ENCOUNTER — Encounter: Payer: Self-pay | Admitting: Physical Therapy

## 2020-06-03 DIAGNOSIS — M6281 Muscle weakness (generalized): Secondary | ICD-10-CM | POA: Diagnosis not present

## 2020-06-03 DIAGNOSIS — M25652 Stiffness of left hip, not elsewhere classified: Secondary | ICD-10-CM

## 2020-06-03 DIAGNOSIS — R262 Difficulty in walking, not elsewhere classified: Secondary | ICD-10-CM | POA: Diagnosis not present

## 2020-06-03 DIAGNOSIS — G8929 Other chronic pain: Secondary | ICD-10-CM

## 2020-06-03 DIAGNOSIS — M5442 Lumbago with sciatica, left side: Secondary | ICD-10-CM | POA: Diagnosis not present

## 2020-06-03 DIAGNOSIS — R252 Cramp and spasm: Secondary | ICD-10-CM | POA: Diagnosis not present

## 2020-06-03 LAB — MULTIPLE MYELOMA PANEL, SERUM
Albumin SerPl Elph-Mcnc: 3.7 g/dL (ref 2.9–4.4)
Albumin/Glob SerPl: 0.7 (ref 0.7–1.7)
Alpha 1: 0.2 g/dL (ref 0.0–0.4)
Alpha2 Glob SerPl Elph-Mcnc: 0.6 g/dL (ref 0.4–1.0)
B-Globulin SerPl Elph-Mcnc: 4.3 g/dL — ABNORMAL HIGH (ref 0.7–1.3)
Gamma Glob SerPl Elph-Mcnc: 0.4 g/dL (ref 0.4–1.8)
Globulin, Total: 5.4 g/dL — ABNORMAL HIGH (ref 2.2–3.9)
IgA: 4345 mg/dL — ABNORMAL HIGH (ref 87–352)
IgG (Immunoglobin G), Serum: 491 mg/dL — ABNORMAL LOW (ref 586–1602)
IgM (Immunoglobulin M), Srm: 14 mg/dL — ABNORMAL LOW (ref 26–217)
M Protein SerPl Elph-Mcnc: 3.3 g/dL — ABNORMAL HIGH
Total Protein ELP: 9.1 g/dL — ABNORMAL HIGH (ref 6.0–8.5)

## 2020-06-03 NOTE — Therapy (Signed)
[place], [place], [address] Phone: [phone]   Fax:  [phone]  Physical Therapy Treatment  Patient Details  Name: Hannah [name] MRN: [id number] Date of Birth: [birth year] Referring Provider (PT): Frankey Shown MD   Encounter Date: 06/03/2020   PT End of Session - 06/03/20 1631    Visit Number 2    Number of Visits 12    Date for PT Re-Evaluation 06/30/20    Authorization Type BCBS    PT Start Time 1416    PT Stop Time 1500    PT Time Calculation (min) 44 min    Activity Tolerance Patient tolerated treatment well    Behavior During Therapy [facility] for tasks assessed/performed           Past Medical History:  Diagnosis Date  . Allergy   . Anemia   . Anxiety   . Arthritis   . Asthma   . Depression   . Hypertension     Past Surgical History:  Procedure Laterality Date  . CHOLECYSTECTOMY  1993  . COLONOSCOPY    . 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 TOTAL KNEE ARTHROPLASTY Right 01/28/2016   Procedure: RIGHT TOTAL KNEE ARTHROPLASTY;  Surgeon: [name], MD;  Location: [facility] [place];  Service: Orthopedics;  Laterality: Right;  . TRIGGER FINGER RELEASE Right 06/18/2014   Procedure: RIGHT LONG FINGER TRIGGER RELEASE;  Surgeon: [name], MD;  Location: [facility];  Service: Orthopedics;  Laterality: Right;  . TUBAL LIGATION    . VAGINAL HYSTERECTOMY Bilateral 11/04/2016   Procedure: HYSTERECTOMY VAGINAL with Bilateral Salpingectomy;  Surgeon: [name], MD;  Location: [facility];  Service: Gynecology;  Laterality: Bilateral;    There were no vitals filed for this visit.   Subjective Assessment - 06/03/20 1629    Subjective I feel better in the water  I feel like a 6/10 on land and I have improved like a 4/5 out or 10 after being in the water.  We just got back from [place] and I had a touch time trying to walk.  they ran out of scooters by the time we arrived there.     Pertinent History R TKA  01-28-2016  bil trigger release  , hysterectomy,   bil shld OA Left knee OA   HBP    Currently in Pain? Yes    Pain Score 6     Pain Location Back    Pain Orientation Right;Left;Lower    Pain Descriptors / Indicators Numbness;Aching           Aquatic therapy at [facility] - pool temp 86.5 degrees Patient seen for aquatic therapy today at [facility] for 1st time.    Pt enters [facility] without AD and  ambulates to handrails by pool.  Treatment took place in water 3.5-4 feet deep depending upon activity.  Pt entered and exited the pool via ramp negotiation with use of bil handrails and supervision in the water.  Pt is tentative in the water due to not knowing how to swim.  Pt is given orange noodle for security and balance in the water. Pt ambulates in water with shortened steps for 20 m x 2. Then pool noodle removed so she can ambulate without dependence on pool noodle 20 m x 2, then Pt walks with PT providing turbulence anterior of pt so she can exert movement and work on balance. 20 m x 2.  Pt gained confidence to walk  backwards with PT providing VC and TC as need 20 m x 1  Ms Persad was able to use aquatic barbells submerged for increased abdominal engagement  with side stepping  20 m x 2   Pt first initiated  hip hinge in water on steps x 15 with VC and TC for proper execution. Then standing with external cue of pool wall x 15.  Runners stretch 2 x Right and then moves into hamstring stretch,  Runners stretch  2 x Left and then moves into hamstring stretch.  Pt also educated on sciatic nerve glide in water for decrease in nerve irritation of L hip.  On edge of pool with bil UE support  Pt performed LE exercise  Hip abd/add R/L 20 x each and then using 1 UE support Hip ext/flex with knee straight x 20,  Marching knee/hip 90/90 x 20 and then ham curl R/L x 20. Marland Kitchen  Heel raises in water bil with UE support on pool ledge x 30 Squats x 10 reps with intermittent UE support x 2  sets. Reverse lunge on edge of pool  Holding onto ledge, 20 x on R and the 20 x on L Use of kick board submerged for increase of abdominal engagement and during trunk rotation 10 to right and 10x to Left  Pt ended session with 20 m walking forwards x 4 with noticeable confidence and increased stride length   .                            PT Education - 06/03/20 1631    Education Details Educated on properties and benefits of water and demonstrated exercises to use for HEP    Person(s) Educated Patient    Methods Explanation;Demonstration;Tactile cues;Verbal cues    Comprehension Verbalized understanding;Returned demonstration            PT Short Term Goals - 05/19/20 1432      PT SHORT TERM GOAL #1   Title STG=LTG             PT Long Term Goals - 05/19/20 1432      PT LONG TERM GOAL #1   Title Pt will be independent with all HEP given    Baseline No knowledge    Time 6    Period Weeks    Status New    Target Date 06/30/20      PT LONG TERM GOAL #2   Title Pt will be able to perform household chores without exacerbating back pain and using pain managment techniques    Baseline Pt unable to sweep or wash dishes for longer than 10 minutes    Time 6    Period Weeks    Status New    Target Date 06/30/20      PT LONG TERM GOAL #3   Title Pt will be able to perform a 5 x sts in 13 sec or less showing improved LE strength and control without pain    Baseline 5 x STS  23.6 sec    Time 6    Period Weeks    Status New    Target Date 06/30/20      PT LONG TERM GOAL #4   Title FOTO will improve from 28 %   to  45 %   indicating improved functional mobility .    Baseline eval 285    Time 6    Period Weeks  Status New    Target Date 06/30/20      PT LONG TERM GOAL #5   Title Pt will be able to negotiate steps with step over step technique and carrying 20 # of groceries    Baseline unable to negotiate steps , avoids due to pain and does not  carry groceries due to pain    Time 6    Period Weeks    Status New    Target Date 06/30/20                 Plan - 06/03/20 1632    Clinical Impression Statement Pt enters [facility] for 1st time for aquatic therapy.  Pt is tentative and reports she does not know how to swim.  PT introduces her to principles and properties of water while ambulating across pool.  Pt given pool noodle for added confidence and for UE support as she walks forward .  As she progresses , the pool noodle was not necessary for her to perform exericises in water.  Pt reported 6/10 pain and reduced to 4/5 out of 10 after session.  Pt with increased stride length and no antalgic gait in water.  due to her pain, Ms Wolfer is unable to tolerate land due  inability to move freely on land without pain and substitution/compensations.Water allows for reduced gait deviation due to reduced joint loading through buoyancy to help patient improve posture without excess stress and pain. Will contine with Aquatic Therapy to form Aquatic HEP  for one of the tools for pain managment    Personal Factors and Comorbidities Comorbidity 1    Comorbidities R TKA  01-28-2016  bil trigger release  , hysterectomy,   bil shld OA Left knee OA   HBP    Examination-Activity Limitations Sleep;Squat;Stand;Stairs;Transfers;Locomotion Level;Sit    Examination-Participation Restrictions Occupation    PT Frequency 2x / week    PT Duration 6 weeks    PT Treatment/Interventions ADLs/Self Care Home Management;Aquatic Therapy;Cryotherapy;Electrical Stimulation;Moist Heat;Iontophoresis 51m/ml Dexamethasone;Gait training;Stair training;Functional mobility training;Therapeutic activities;Therapeutic exercise;Balance training;Neuromuscular re-education;Manual techniques;Patient/family education;Passive range of motion;Dry needling;Taping;Joint Manipulations    PT Next Visit Plan Assess TPDN  basic back work on sciatic nerve glides   Check SI    PT Home Exercise Plan  [email or web address]    Consulted and Agree with Plan of Care Patient           Patient will benefit from skilled therapeutic intervention in order to improve the following deficits and impairments:  Obesity,Pain,Postural dysfunction,Improper body mechanics,Difficulty walking,Decreased mobility,Decreased range of motion,Decreased strength,Hypomobility,Impaired flexibility,Increased muscle spasms  Visit Diagnosis: Chronic low back pain with left-sided sciatica, unspecified back pain laterality  Stiffness of left hip, not elsewhere classified  Muscle weakness (generalized)  Cramp and spasm  Difficulty in walking, not elsewhere classified     Problem List Patient Active Problem List   Diagnosis Date Noted  . Trigger finger, left middle finger 04/09/2020  . Stenosing tenosynovitis of finger of left hand 02/27/2020  . Hemoglobin C trait ([facility] 02/27/2018  . Major depression, recurrent ([facility] 01/12/2018  . Chronic right shoulder pain 12/20/2017  . Smoldering multiple myeloma ([facility] [place] 11/28/2017  . MDD (major depressive disorder), recurrent episode, moderate ([place] 11/04/2017  . MGUS (monoclonal gammopathy of unknown significance) 10/24/2017  . Primary osteoarthritis of both hands 09/26/2017  . Primary osteoarthritis of right shoulder 09/26/2017  . Primary osteoarthritis of both hips 09/26/2017  . Status post total knee replacement, right 09/26/2017  . Primary osteoarthritis of both feet  09/26/2017  . History of asthma 09/26/2017  . Primary osteoarthritis of left knee 04/18/2017  . Menorrhagia 11/04/2016  . Fibroid tumor 11/04/2016  . Adenomyosis 11/04/2016  . Hypertension, essential 11/04/2016  . Total knee replacement status 01/28/2016    Voncille Lo, PT, [place] Certified Exercise Expert for the Aging Adult  06/03/20 6:57 PM Phone: [phone] Fax: [facility] [facility] [address] [place], [place], [address] Phone:  [phone]   Fax:  [phone]  Name: Hannah [name] MRN: [id number] Date of Birth: 03-24-1961

## 2020-06-04 ENCOUNTER — Other Ambulatory Visit: Payer: Self-pay

## 2020-06-04 ENCOUNTER — Ambulatory Visit: Payer: BC Managed Care – PPO | Admitting: Physical Therapy

## 2020-06-04 ENCOUNTER — Encounter: Payer: Self-pay | Admitting: Physical Therapy

## 2020-06-04 DIAGNOSIS — M6281 Muscle weakness (generalized): Secondary | ICD-10-CM | POA: Diagnosis not present

## 2020-06-04 DIAGNOSIS — G8929 Other chronic pain: Secondary | ICD-10-CM | POA: Diagnosis not present

## 2020-06-04 DIAGNOSIS — R252 Cramp and spasm: Secondary | ICD-10-CM

## 2020-06-04 DIAGNOSIS — R262 Difficulty in walking, not elsewhere classified: Secondary | ICD-10-CM

## 2020-06-04 DIAGNOSIS — M25652 Stiffness of left hip, not elsewhere classified: Secondary | ICD-10-CM

## 2020-06-04 DIAGNOSIS — M5442 Lumbago with sciatica, left side: Secondary | ICD-10-CM | POA: Diagnosis not present

## 2020-06-04 NOTE — Patient Instructions (Signed)
Access Code: [email or web address] URL: [email or web address] Date: 06/04/2020 Prepared by: Rosana Hoes  Exercises Supine Figure 4 Piriformis Stretch - 1 x daily - 7 x weekly - 1 sets - 3 reps - 30 sec hold Supine Piriformis Stretch - 1 x daily - 7 x weekly - 3 sets - 3 reps - 20-30 sec hold Supine Bridge with Spinal Articulation - 1 x daily - 4-5 x weekly - 3 sets - 5 reps Clamshell with Resistance - 1 x daily - 4-5 x weekly - 2 sets - 15 reps Sit to Stand without Arm Support - 1 x daily - 4-5 x weekly - 2 sets - 15 reps

## 2020-06-04 NOTE — Therapy (Signed)
[place], [place], [address] Phone: [phone]   Fax:  [phone]  Physical Therapy Treatment  Patient Details  Name: Hannah [name] MRN: [id number] Date of Birth: 13-Dec-1960 Referring Provider (PT): Frankey Shown MD   Encounter Date: 06/04/2020   PT End of Session - 06/04/20 1132    Visit Number 3    Number of Visits 12    Date for PT Re-Evaluation 06/30/20    Authorization Type BC[employer]    PT Start Time 1131    PT Stop Time 1212    PT Time Calculation (min) 41 min    Activity Tolerance Patient tolerated treatment well    Behavior During Therapy [facility] for tasks assessed/performed           Past Medical History:  Diagnosis Date  . Allergy   . Anemia   . Anxiety   . Arthritis   . Asthma   . Depression   . Hypertension     Past Surgical History:  Procedure Laterality Date  . CHOLECYSTECTOMY  1993  . COLONOSCOPY    . 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 TOTAL KNEE ARTHROPLASTY Right 01/28/2016   Procedure: RIGHT TOTAL KNEE ARTHROPLASTY;  Surgeon: [name], MD;  Location: [facility];  Service: Orthopedics;  Laterality: Right;  . TRIGGER FINGER RELEASE Right 06/18/2014   Procedure: RIGHT LONG FINGER TRIGGER RELEASE;  Surgeon: [name], MD;  Location: [facility];  Service: Orthopedics;  Laterality: Right;  . TUBAL LIGATION    . VAGINAL HYSTERECTOMY Bilateral 11/04/2016   Procedure: HYSTERECTOMY VAGINAL with Bilateral Salpingectomy;  Surgeon: [name], MD;  Location: [facility];  Service: Gynecology;  Laterality: Bilateral;    There were no vitals filed for this visit.   Subjective Assessment - 06/04/20 1133    Subjective Patient reports she is feeling "not so bad." She has been stretching. She states she had aquatic therapy yesterday and it was amazing.    Patient Stated Goals I have trouble getting up and down steps trouble sleeping    Currently in Pain? Yes     Pain Score 6     Pain Location Back    Pain Orientation Right;Lower    Pain Descriptors / Indicators Aching    Pain Type Chronic pain    Pain Onset More than a month ago    Pain Frequency Intermittent                             [facility] Adult PT Treatment/Exercise - 06/04/20 0001      Exercises   Exercises Lumbar      Lumbar Exercises: Stretches   Lower Trunk Rotation 3 reps;10 seconds    Lower Trunk Rotation Limitations greater difficulty rotating to right    Piriformis Stretch 2 reps;30 seconds    Piriformis Stretch Limitations supine figure-4 knee to chest      Lumbar Exercises: Aerobic   Nustep L5 x 5 min with LE only      Lumbar Exercises: Standing   Lifting 5 reps    Lifting Weights (lbs) 15    Lifting Limitations dead lift from 8" box      Lumbar Exercises: Seated   Sit to Stand 5 reps    Sit to Stand Limitations cued for hip hinge technique, without use of UE for assist      Lumbar Exercises: Supine   Pelvic  Tilt 10 reps;5 seconds    Bridge 5 reps;3 seconds   3 sets, 2nd-3rd set performed with red band around knees   Bridge Limitations focus on gluteal/abdominal engagement, lifting one vertebrae at a time      Lumbar Exercises: Sidelying   Clam 15 reps   2 sets   Clam Limitations red band                  PT Education - 06/04/20 1131    Education Details HEP update    Person(s) Educated Patient    Methods Explanation;Verbal cues;Handout;Demonstration    Comprehension Verbalized understanding;Need further instruction;Returned demonstration;Verbal cues required            PT Short Term Goals - 05/19/20 1432      PT SHORT TERM GOAL #1   Title STG=LTG             PT Long Term Goals - 05/19/20 1432      PT LONG TERM GOAL #1   Title Pt will be independent with all HEP given    Baseline No knowledge    Time 6    Period Weeks    Status New    Target Date 06/30/20      PT LONG TERM GOAL #2   Title Pt will be able to  perform household chores without exacerbating back pain and using pain managment techniques    Baseline Pt unable to sweep or wash dishes for longer than 10 minutes    Time 6    Period Weeks    Status New    Target Date 06/30/20      PT LONG TERM GOAL #3   Title Pt will be able to perform a 5 x sts in 13 sec or less showing improved LE strength and control without pain    Baseline 5 x STS  23.6 sec    Time 6    Period Weeks    Status New    Target Date 06/30/20      PT LONG TERM GOAL #4   Title FOTO will improve from 28 %   to  45 %   indicating improved functional mobility .    Baseline eval 285    Time 6    Period Weeks    Status New    Target Date 06/30/20      PT LONG TERM GOAL #5   Title Pt will be able to negotiate steps with step over step technique and carrying 20 # of groceries    Baseline unable to negotiate steps , avoids due to pain and does not carry groceries due to pain    Time 6    Period Weeks    Status New    Target Date 06/30/20                 Plan - 06/04/20 1132    Clinical Impression Statement Patient tolerated therapy well with no adverse effects. She was reporting more right sided lower back/hip pain this visit. Therapy focused on progressing hip/core strengthening and initiating lifting mechanics. Patient required cueing for proper hip hinge mechanics and keeping neutral spine. HEP was progressed this visit with good tolerance. Patient would benefit from continued skilled PT to progress mobility and strength in order to reduce pain and maximize functional ability.    PT Treatment/Interventions ADLs/Self Care Home Management;Aquatic Therapy;Cryotherapy;Electrical Stimulation;Moist Heat;Iontophoresis 26m/ml Dexamethasone;Gait training;Stair training;Functional mobility training;Therapeutic activities;Therapeutic exercise;Balance training;Neuromuscular re-education;Manual techniques;Patient/family education;Passive range of  motion;Dry  needling;Taping;Joint Manipulations    PT Next Visit Plan Assess TPDN  basic back work on sciatic nerve glides   Check SI    PT Home Exercise Plan [id number]    Consulted and Agree with Plan of Care Patient           Patient will benefit from skilled therapeutic intervention in order to improve the following deficits and impairments:  Obesity,Pain,Postural dysfunction,Improper body mechanics,Difficulty walking,Decreased mobility,Decreased range of motion,Decreased strength,Hypomobility,Impaired flexibility,Increased muscle spasms  Visit Diagnosis: Chronic low back pain with left-sided sciatica, unspecified back pain laterality  Stiffness of left hip, not elsewhere classified  Muscle weakness (generalized)  Cramp and spasm  Difficulty in walking, not elsewhere classified     Problem List Patient Active Problem List   Diagnosis Date Noted  . Trigger finger, left middle finger 04/09/2020  . Stenosing tenosynovitis of finger of left hand 02/27/2020  . Hemoglobin C trait ([place]) 02/27/2018  . Major depression, recurrent ([place]) 01/12/2018  . Chronic right shoulder pain 12/20/2017  . Smoldering multiple myeloma ([place]) 11/28/2017  . MDD (major depressive disorder), recurrent episode, moderate ([place]) 11/04/2017  . MGUS (monoclonal gammopathy of unknown significance) 10/24/2017  . Primary osteoarthritis of both hands 09/26/2017  . Primary osteoarthritis of right shoulder 09/26/2017  . Primary osteoarthritis of both hips 09/26/2017  . Status post total knee replacement, right 09/26/2017  . Primary osteoarthritis of both feet 09/26/2017  . History of asthma 09/26/2017  . Primary osteoarthritis of left knee 04/18/2017  . Menorrhagia 11/04/2016  . Fibroid tumor 11/04/2016  . Adenomyosis 11/04/2016  . Hypertension, essential 11/04/2016  . Total knee replacement status 01/28/2016    Hilda Blades, PT, DPT, LAT, ATC 06/04/20  1:35 PM Phone: [phone] Fax: [facility] [address] [place], [place], [address] Phone: [phone]   Fax:  [phone]  Name: Hannah [name] MRN: [id number] Date of Birth: 04/29/1961

## 2020-06-05 NOTE — Progress Notes (Signed)
[place]   [phone] [phone]   Clinic Follow up Note   Patient Care Team: Tisovec, Fransico Him, MD as PCP - General (Internal Medicine) Bo Merino, MD as Consulting Physician (Rheumatology)   I connected with Hannah [name] on 06/08/2020 at  8:00 AM EST by telephone visit and verified that I am speaking with the correct person using two identifiers.  I discussed the limitations, risks, security and privacy concerns of performing an evaluation and management service by telephone and the availability of in person appointments. I also discussed with the patient that there may be a patient responsible charge related to this service. The patient expressed understanding and agreed to proceed.   Other persons participating in the visit and their role in the encounter:  None  Patient's location:  Her home  Provider's location:  My office   CHIEF COMPLAINT: Follow-upanemia andsmoldering multiple myeloma   CURRENT THERAPY:  Surveillance and oral iron  INTERVAL HISTORY:  Hannah [name] is here for a follow up of MM. She notes she is doing well. She denies any major changes in the last few months. She continues to see Orthopedist for her sciatica. She is also doing PT. She notes her chronic back pain is stable and no new concerning pain. She notes her labs discussion is a lot of information and she is not sure what it all means but wants to continue observation and repeat lab in 2 months. She notes she has surgery on her foot this month.   She notes she has had her COVID vaccinations and booster.    REVIEW OF SYSTEMS:   Constitutional: Denies fevers, chills or abnormal weight loss Eyes: Denies blurriness of vision Ears, nose, mouth, throat, and face: Denies mucositis or sore throat Respiratory: Denies cough, dyspnea or wheezes Cardiovascular: Denies palpitation, chest discomfort or lower extremity swelling Gastrointestinal:  Denies nausea,  heartburn or change in bowel habits Skin: Denies abnormal skin rashes MSK: (+) Stable chronic back pain  Lymphatics: Denies new lymphadenopathy or easy bruising Neurological:Denies numbness, tingling or new weaknesses Behavioral/Psych: Mood is stable, no new changes  All other systems were reviewed with the patient and are negative.  MEDICAL HISTORY:  Past Medical History:  Diagnosis Date  . Allergy   . Anemia   . Anxiety   . Arthritis   . Asthma   . Depression   . Hypertension     SURGICAL HISTORY: Past Surgical History:  Procedure Laterality Date  . CHOLECYSTECTOMY  1993  . COLONOSCOPY    . 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 TOTAL KNEE ARTHROPLASTY Right 01/28/2016   Procedure: RIGHT TOTAL KNEE ARTHROPLASTY;  Surgeon: [name], MD;  Location: [facility];  Service: Orthopedics;  Laterality: Right;  . TRIGGER FINGER RELEASE Right 06/18/2014   Procedure: RIGHT LONG FINGER TRIGGER RELEASE;  Surgeon: [name], MD;  Location: [facility];  Service: Orthopedics;  Laterality: Right;  . TUBAL LIGATION    . VAGINAL HYSTERECTOMY Bilateral 11/04/2016   Procedure: HYSTERECTOMY VAGINAL with Bilateral Salpingectomy;  Surgeon: [name], MD;  Location: [facility];  Service: Gynecology;  Laterality: Bilateral;    I have reviewed the social history and family history with the patient and they are unchanged from previous note.  ALLERGIES:  is allergic to sulfur.  MEDICATIONS:  Current Outpatient Medications  Medication Sig Dispense Refill  . budesonide-formoterol (SYMBICORT) 160-4.5 MCG/ACT inhaler Inhale 2 puffs into the lungs daily as needed.    Marland Kitchen  buPROPion (WELLBUTRIN XL) 150 MG 24 hr tablet 450 mg daily (150 mg + 300 mg) 30 tablet 0  . chlorthalidone (HYGROTON) 25 MG tablet TAKE 1 TABLET BY MOUTH EVERY DAY FOR HTN  3  . ferrous sulfate 325 (65 FE) MG tablet Take 325 mg by mouth 2 (two) times daily with a meal.    . HYDROcodone-acetaminophen (NORCO)  5-325 MG tablet Take 1 tablet by mouth 3 (three) times daily as needed for moderate pain. (Patient not taking: Reported on 05/19/2020) 30 tablet 0  . ibuprofen (ADVIL,MOTRIN) 600 MG tablet 600 MG BY MOUTH EVERY 6 HOURS FOR 5 DAYS, THEN EVERY 6 HOURS AS NEEDED FOR PAIN.  DON'T TAKE MOBIC WHILE TAKING IBUPROFEN 60 tablet 0  . KLOR-CON M20 20 MEQ tablet TAKE 1 TABLET (20 MEQ TOTAL) BY MOUTH DAILY. TAKE 1 TAB TWICE DAILY FOR 3 DAYS THEN TAKE 1 TAB DAILY 33 tablet 0  . methocarbamol (ROBAXIN) 500 MG tablet Take 1 tablet (500 mg total) by mouth 2 (two) times daily as needed. 20 tablet 0  . Multiple Vitamin (MULTIVITAMIN) tablet Take 1 tablet by mouth daily.    . naproxen sodium (ALEVE) 220 MG tablet Take 220 mg by mouth as needed.    . ondansetron (ZOFRAN) 4 MG tablet Take 1 tablet (4 mg total) by mouth every 8 (eight) hours as needed for nausea or vomiting. 40 tablet 0  . predniSONE (STERAPRED UNI-PAK 21 TAB) 10 MG (21) TBPK tablet Take as directed (Patient not taking: Reported on 05/19/2020) 21 tablet 0  . sertraline (ZOLOFT) 50 MG tablet Take 1 tablet (50 mg total) by mouth at bedtime. 90 tablet 0  . traZODone (DESYREL) 50 MG tablet Take 1 tablet (50 mg total) by mouth at bedtime. 90 tablet 0  . valACYclovir (VALTREX) 500 MG tablet Take 500 mg by mouth 2 (two) times daily.    . Vitamin D, Ergocalciferol, (DRISDOL) 50000 units CAPS capsule Take 50,000 Units by mouth every Monday.      No current facility-administered medications for this visit.    PHYSICAL EXAMINATION: ECOG PERFORMANCE STATUS: 1 - Symptomatic but completely ambulatory  No vitals taken today, Exam not performed today   LABORATORY DATA:  I have reviewed the data as listed CBC Latest Ref Rng & Units 06/01/2020 03/19/2020 02/04/2020  WBC 4.0 - 10.5 K/uL 7.3 10.5 9.7  Hemoglobin 12.0 - 15.0 g/dL 10.7(L) 11.1(L) 11.2(L)  Hematocrit 36.0 - 46.0 % 33.2(L) 34.5(L) 34.2(L)  Platelets 150 - 400 K/uL 216 241 230     CMP Latest Ref Rng &  Units 06/01/2020 03/19/2020 02/04/2020  Glucose 70 - 99 mg/dL 104(H) 90 97  BUN 6 - 20 mg/dL 15 23(H) 17  Creatinine 0.44 - 1.00 mg/dL 0.82 0.87 0.88  Sodium 135 - 145 mmol/L 142 140 138  Potassium 3.5 - 5.1 mmol/L 3.5 3.3(L) 3.9  Chloride 98 - 111 mmol/L 101 98 100  CO2 22 - 32 mmol/L 32 31 29  Calcium 8.9 - 10.3 mg/dL 9.5 9.4 9.4  Total Protein 6.5 - 8.1 g/dL 9.6(H) 9.3(H) 9.0(H)  Total Bilirubin 0.3 - 1.2 mg/dL 0.3 0.4 0.5  Alkaline Phos 38 - 126 U/L 74 81 70  AST 15 - 41 U/L 19 12(L) 10(L)  ALT 0 - 44 U/L 24 10 8       RADIOGRAPHIC STUDIES: I have personally reviewed the radiological images as listed and agreed with the findings in the report. No results found.   ASSESSMENT & PLAN:  Hannah  VANISSA [name] is a 60 y.o. female with    1. Smoldering multiple myeloma, IgA Kappa type -Wepreviouslyreviewed her medical chart including labs and imaging in detail with the patient.  -Herpreviousoutsidetest showed elevated M protein 1.2g/dl, elevated IgA level, light chain levels were within normal limits and ratio was slightly increased.She does have mild anemia, likely related to iron deficiency, no renal failure, hypercalcemia, neuropathy, and her previous surgery was negative. -Discussed her bone marrow biopsy findingsfrom 10/2017. She has increased plasma (6% on aspirate, 20% by CD 138). I previouslydiscussed with pathologist Dr. Gari Crown, the increased plasma cells in bone marrow is likely between 10 to 20%, and would favorsmolderingmultiple myeloma than MGUS -Also patient does not meet CRAB for MM, she does havearthritis oflow back,hipand right shoulder.Ipreviouslyobtaineda pelvic and lumbar MRIwhich was negativefor myeloma involvement.  Her 11/2019 bone survey was also negative for lytic bone lesion. -We discussed her 06/01/20 labs. CBC and CMP WNL except Hg 10.7, BG 104, protein 9.6, albumin 3.1, overall stable. Her MM panel shows IgA and IgG has increased and M-protein increased to  3.3. Her light chain is also increasing with ratio 87.53. She is not currently symptomatic.  -I discussed if her lihght chain ratio continue to increase she would meet criteria for Multiple Myeloma when th eratio is above 100, and it would require treatment. I discussed repeating lab in another 2 months and if her light chain ratio is closer to 100 or above, I would recommend another bone marrow biopsy and PET scan. She is agreeable.  -I discussed MM treatment would include oral, IV or injection treatment. She would also have the option of bone marrow transplant followed by maintenance therapy. We reviewed in detail today  -F/u with OV after lab in 2 months.  -She has had COVID booster. I recommend she stay up to date on flu and shingles vaccine as well.   2. 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  -Hemoglobin electrophoresisshowed increase in Hg C, consistent with Hg C trait. I discussed the results with her and informed her that her children are at risk ofhemoglobin C trait, and I encouraged him to be tested. -Anemia overall mild and stable, Hg 10.7 (06/01/20)Continue oral ironBID.  3. HTN, OA, Obesity  -On chlorthalidone for HTN -She has imaging evidence consistent with OA in multiple joints. Pain currently controlled with NSAIDs and tylenol PRN. -She will continue to f/u with PCP, Chiropractor and Ortho. -She has no concerning pain. Given her smoldering Myeloma, I recommend she watch for worsening pain and not do heavy lifting.   4. Anxiety, depression -under care of [place] provider, stable and tolerating Zoloft   PLAN: -F/u in 2 months with lab including urine test a week before.  -she knows to call me if she has new or worsening bone pain before next visit    No problem-specific Assessment & Plan notes found for this encounter.   No orders of the defined types were placed in this encounter.  I  discussed the assessment and treatment plan with the patient. The patient was provided an opportunity to ask questions and all were answered. The patient agreed with the plan and demonstrated an understanding of the instructions.  The patient was advised to call back or seek an in-person evaluation if the symptoms worsen or if the condition fails to improve as anticipated.  The total time spent in the appointment was 30 minutes.    Truitt Merle, MD  06/08/2020   I, Joslyn Devon, am acting as scribe for Truitt Merle, MD.   I have reviewed the above documentation for accuracy and completeness, and I agree with the above.

## 2020-06-08 ENCOUNTER — Encounter: Payer: Self-pay | Admitting: Hematology

## 2020-06-08 ENCOUNTER — Inpatient Hospital Stay (HOSPITAL_BASED_OUTPATIENT_CLINIC_OR_DEPARTMENT_OTHER): Payer: BC Managed Care – PPO | Admitting: Hematology

## 2020-06-08 DIAGNOSIS — D472 Monoclonal gammopathy: Secondary | ICD-10-CM

## 2020-06-08 DIAGNOSIS — C9000 Multiple myeloma not having achieved remission: Secondary | ICD-10-CM | POA: Diagnosis not present

## 2020-06-09 ENCOUNTER — Telehealth: Payer: Self-pay | Admitting: Hematology

## 2020-06-09 ENCOUNTER — Ambulatory Visit: Payer: BC Managed Care – PPO | Admitting: Physical Therapy

## 2020-06-09 ENCOUNTER — Other Ambulatory Visit: Payer: Self-pay

## 2020-06-09 ENCOUNTER — Encounter: Payer: Self-pay | Admitting: Physical Therapy

## 2020-06-09 DIAGNOSIS — M6281 Muscle weakness (generalized): Secondary | ICD-10-CM | POA: Diagnosis not present

## 2020-06-09 DIAGNOSIS — R252 Cramp and spasm: Secondary | ICD-10-CM

## 2020-06-09 DIAGNOSIS — M5442 Lumbago with sciatica, left side: Secondary | ICD-10-CM | POA: Diagnosis not present

## 2020-06-09 DIAGNOSIS — R262 Difficulty in walking, not elsewhere classified: Secondary | ICD-10-CM | POA: Diagnosis not present

## 2020-06-09 DIAGNOSIS — G8929 Other chronic pain: Secondary | ICD-10-CM

## 2020-06-09 DIAGNOSIS — M25652 Stiffness of left hip, not elsewhere classified: Secondary | ICD-10-CM | POA: Diagnosis not present

## 2020-06-09 NOTE — Therapy (Signed)
[place], [place], [address] Phone: [phone]   Fax:  [phone]  Physical Therapy Treatment  Patient Details  Name: Hannah [name] MRN: [id number] Date of Birth: [birth year] Referring Provider (PT): Frankey Shown MD   Encounter Date: 06/09/2020   PT End of Session - 06/09/20 1055    Visit Number 4    Number of Visits 12    Date for PT Re-Evaluation 06/30/20    Authorization Type BCBS    PT Start Time 1048    PT Stop Time 1128    PT Time Calculation (min) 40 min    Activity Tolerance Patient tolerated treatment well    Behavior During Therapy [facility] for tasks assessed/performed           Past Medical History:  Diagnosis Date  . Allergy   . Anemia   . Anxiety   . Arthritis   . Asthma   . Depression   . Hypertension     Past Surgical History:  Procedure Laterality Date  . CHOLECYSTECTOMY  1993  . COLONOSCOPY    . 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 TOTAL KNEE ARTHROPLASTY Right 01/28/2016   Procedure: RIGHT TOTAL KNEE ARTHROPLASTY;  Surgeon: [name], MD;  Location: [facility];  Service: Orthopedics;  Laterality: Right;  . TRIGGER FINGER RELEASE Right 06/18/2014   Procedure: RIGHT LONG FINGER TRIGGER RELEASE;  Surgeon: [name], MD;  Location: [facility];  Service: Orthopedics;  Laterality: Right;  . TUBAL LIGATION    . VAGINAL HYSTERECTOMY Bilateral 11/04/2016   Procedure: HYSTERECTOMY VAGINAL with Bilateral Salpingectomy;  Surgeon: [name], MD;  Location: [facility];  Service: Gynecology;  Laterality: Bilateral;    There were no vitals filed for this visit.   Subjective Assessment - 06/09/20 1054    Subjective Patient reports she is feeling pretty good, still having some discomfort right hip/buttock discomfort but it has not traveled. She reports feeling good following last session.    Patient Stated Goals I have trouble getting up and down steps trouble  sleeping    Currently in Pain? Yes    Pain Score 5     Pain Location Buttocks    Pain Orientation Right    Pain Descriptors / Indicators Aching    Pain Type Chronic pain    Pain Onset More than a month ago    Pain Frequency Intermittent                             [facility] Adult PT Treatment/Exercise - 06/09/20 0001      Exercises   Exercises Lumbar      Lumbar Exercises: Aerobic   Nustep L5 x 5 min with LE only      Lumbar Exercises: Standing   Lifting 5 reps   3 sets   Lifting Weights (lbs) 15    Lifting Limitations dead lift from 6" box    Other Standing Lumbar Exercises Pallof press 7# with FM 2 x 10      Lumbar Exercises: Seated   Sit to Stand 5 reps   2 sets   Sit to Stand Limitations cued for hip hinge technique, without use of UE for assist      Lumbar Exercises: Supine   Bridge Limitations 3 x 6 with 3 sec hold      Manual Therapy   Manual Therapy Soft tissue mobilization  Soft tissue mobilization Right buttock, hip, lower back regions with roller   patient in left sidelying                 PT Education - 06/09/20 1055    Education Details HEP    Person(s) Educated Patient    Methods Explanation    Comprehension Verbalized understanding;Need further instruction            PT Short Term Goals - 05/19/20 1432      PT SHORT TERM GOAL #1   Title STG=LTG             PT Long Term Goals - 05/19/20 1432      PT LONG TERM GOAL #1   Title Pt will be independent with all HEP given    Baseline No knowledge    Time 6    Period Weeks    Status New    Target Date 06/30/20      PT LONG TERM GOAL #2   Title Pt will be able to perform household chores without exacerbating back pain and using pain managment techniques    Baseline Pt unable to sweep or wash dishes for longer than 10 minutes    Time 6    Period Weeks    Status New    Target Date 06/30/20      PT LONG TERM GOAL #3   Title Pt will be able to perform a 5 x sts in  13 sec or less showing improved LE strength and control without pain    Baseline 5 x STS  23.6 sec    Time 6    Period Weeks    Status New    Target Date 06/30/20      PT LONG TERM GOAL #4   Title FOTO will improve from 28 %   to  45 %   indicating improved functional mobility .    Baseline eval 285    Time 6    Period Weeks    Status New    Target Date 06/30/20      PT LONG TERM GOAL #5   Title Pt will be able to negotiate steps with step over step technique and carrying 20 # of groceries    Baseline unable to negotiate steps , avoids due to pain and does not carry groceries due to pain    Time 6    Period Weeks    Status New    Target Date 06/30/20                 Plan - 06/09/20 1057    Clinical Impression Statement Patient tolerated therapy well with no adverse effects. She continues to report right buttock/lower back pain but with no referral into legs this visit. Continued core and hip strengthening this visit and patient tolerated addition of increased repetitons well. She has aquatic therapy next visit and one more land appointment scheduled prior to her foot surgery. Patient would benefit from continued skilled PT to progress mobility and strength in order to reduce pain and maximize functional ability.    PT Treatment/Interventions ADLs/Self Care Home Management;Aquatic Therapy;Cryotherapy;Electrical Stimulation;Moist Heat;Iontophoresis 30m/ml Dexamethasone;Gait training;Stair training;Functional mobility training;Therapeutic activities;Therapeutic exercise;Balance training;Neuromuscular re-education;Manual techniques;Patient/family education;Passive range of motion;Dry needling;Taping;Joint Manipulations    PT Next Visit Plan Assess TPDN  basic back work on sciatic nerve glides   Check SI    PT Home Exercise Plan [id number]    Consulted and Agree with Plan of Care  Patient           Patient will benefit from skilled therapeutic intervention in order to improve the  following deficits and impairments:  Obesity,Pain,Postural dysfunction,Improper body mechanics,Difficulty walking,Decreased mobility,Decreased range of motion,Decreased strength,Hypomobility,Impaired flexibility,Increased muscle spasms  Visit Diagnosis: Chronic low back pain with left-sided sciatica, unspecified back pain laterality  Stiffness of left hip, not elsewhere classified  Muscle weakness (generalized)  Cramp and spasm  Difficulty in walking, not elsewhere classified     Problem List Patient Active Problem List   Diagnosis Date Noted  . Trigger finger, left middle finger 04/09/2020  . Stenosing tenosynovitis of finger of left hand 02/27/2020  . Hemoglobin C trait ([place]) 02/27/2018  . Major depression, recurrent ([place]) 01/12/2018  . Chronic right shoulder pain 12/20/2017  . 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 MDD (major depressive disorder), recurrent episode, moderate ([place]) 11/04/2017  . MGUS (monoclonal gammopathy of unknown significance) 10/24/2017  . Primary osteoarthritis of both hands 09/26/2017  . Primary osteoarthritis of right shoulder 09/26/2017  . Primary osteoarthritis of both hips 09/26/2017  . Status post total knee replacement, right 09/26/2017  . Primary osteoarthritis of both feet 09/26/2017  . History of asthma 09/26/2017  . Primary osteoarthritis of left knee 04/18/2017  . Menorrhagia 11/04/2016  . Fibroid tumor 11/04/2016  . Adenomyosis 11/04/2016  . Hypertension, essential 11/04/2016  . Total knee replacement status 01/28/2016    Hilda Blades, PT, DPT, LAT, ATC 06/09/20  1:41 PM Phone: [phone] Fax: [facility] [address] [place], [place], [address] Phone: [phone]   Fax:  [phone]  Name: BROOKELLE PELLICANE MRN: [id number] Date of Birth: May 26, 1961

## 2020-06-09 NOTE — Telephone Encounter (Signed)
Scheduled appts per 1/10 los. Pt confirmed appt date and time.  

## 2020-06-10 ENCOUNTER — Ambulatory Visit: Payer: BC Managed Care – PPO | Admitting: Physical Therapy

## 2020-06-15 ENCOUNTER — Ambulatory Visit: Payer: BC Managed Care – PPO | Admitting: Physical Therapy

## 2020-06-17 ENCOUNTER — Ambulatory Visit: Payer: BC Managed Care – PPO | Admitting: Physical Therapy

## 2020-06-17 ENCOUNTER — Other Ambulatory Visit: Payer: Self-pay

## 2020-06-17 ENCOUNTER — Encounter: Payer: BC Managed Care – PPO | Admitting: Physical Therapy

## 2020-06-17 ENCOUNTER — Encounter: Payer: Self-pay | Admitting: Physical Therapy

## 2020-06-17 DIAGNOSIS — G8929 Other chronic pain: Secondary | ICD-10-CM | POA: Diagnosis not present

## 2020-06-17 DIAGNOSIS — R252 Cramp and spasm: Secondary | ICD-10-CM

## 2020-06-17 DIAGNOSIS — M25652 Stiffness of left hip, not elsewhere classified: Secondary | ICD-10-CM

## 2020-06-17 DIAGNOSIS — M5442 Lumbago with sciatica, left side: Secondary | ICD-10-CM

## 2020-06-17 DIAGNOSIS — M6281 Muscle weakness (generalized): Secondary | ICD-10-CM

## 2020-06-17 DIAGNOSIS — R262 Difficulty in walking, not elsewhere classified: Secondary | ICD-10-CM

## 2020-06-17 NOTE — Therapy (Signed)
[place], [place], [address] Phone: [phone]   Fax:  [phone]  Physical Therapy Treatment  Patient Details  Name: Hannah [name] MRN: [id number] Date of Birth: 05-07-1961 Referring Provider (PT): Frankey Shown MD   Encounter Date: 06/17/2020   PT End of Session - 06/17/20 1740    Visit Number 5    Number of Visits 12    Date for PT Re-Evaluation 06/30/20    Authorization Type BCBS    PT Start Time 1504    PT Stop Time 1550    PT Time Calculation (min) 46 min    Activity Tolerance Patient tolerated treatment well    Behavior During Therapy [facility] for tasks assessed/performed           Past Medical History:  Diagnosis Date  . Allergy   . Anemia   . Anxiety   . Arthritis   . Asthma   . Depression   . Hypertension     Past Surgical History:  Procedure Laterality Date  . CHOLECYSTECTOMY  1993  . COLONOSCOPY    . 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 TOTAL KNEE ARTHROPLASTY Right 01/28/2016   Procedure: RIGHT TOTAL KNEE ARTHROPLASTY;  Surgeon: [name], MD;  Location: [facility];  Service: Orthopedics;  Laterality: Right;  . TRIGGER FINGER RELEASE Right 06/18/2014   Procedure: RIGHT LONG FINGER TRIGGER RELEASE;  Surgeon: [name], MD;  Location: [facility];  Service: Orthopedics;  Laterality: Right;  . TUBAL LIGATION    . VAGINAL HYSTERECTOMY Bilateral 11/04/2016   Procedure: HYSTERECTOMY VAGINAL with Bilateral Salpingectomy;  Surgeon: [name], MD;  Location: [facility];  Service: Gynecology;  Laterality: Bilateral;    There were no vitals filed for this visit.   Subjective Assessment - 06/17/20 1736    Subjective Pt returns for 2nd visit for aquatic Therapy.  With a 7/10 initially into low back/buttocks but decreases to a 4/10 after aquatics and constant activity in the  water for at least 40 minutes.    Pertinent History R TKA  01-28-2016  bil trigger  release  , hysterectomy,   bil shld OA Left knee OA   HBP    Patient Stated Goals I have trouble getting up and down steps trouble sleeping    Currently in Pain? Yes    Pain Score 7     Pain Location Buttocks    Pain Orientation Right    Pain Type Chronic pain    Pain Onset More than a month ago    Pain Frequency Intermittent               Aquatic therapy at [facility] - pool temp 86.5degrees Patient seen for aquatic therapy today at [facility] for 2nd time.   Pt enters [facility] without AD and  ambulates to handrails by pool.Treatment took place in water 3.5-4 feet deep depending upon activity. Pt entered and exited the pool via ramp negotiation with use of bil handrails and supervision in the water.  This week pt did not need noodle or flotation device to initiate movement.  Hannah [name] is clearly more comfortable in the water Pt ambulates in water with increased stride length  for 20 m x 2, backwards x 2 and side stepping x 2  Hannah [name] was able to use aquatic barbells submerged for increased abdominal engagement  with side stepping  20 m x 2. Pt with complaint of R shoulder not able  to lift due to OA in RT shoulder and was instead later put in supine floating to abduction arm to 100 degrees in water x 15   Pt first initiated  hip hinge in water on steps x 15 with VC and TC for proper execution. Then standing with external cue of pool wall x 15.  Runners stretch 2 x Right and then moves into hamstring stretch,  Runners stretch  2 x Left and then moves into hamstring stretch.    On edge of pool with bil UE support Pt performed LE exercise Hip abd/add R/L 20 x each and then using 1 UE support Hip ext/flex with knee straight x 20, Marching knee/hip 90/90 x 20 and then ham curl R/L x 20. Marland Kitchen  Heel raises in water bil with UE support on pool ledge x 30 Squats x 10 reps with intermittent UE support x 2 sets. Reverse lunge on edge of pool  Holding onto ledge, 20 x on R and the 20 x on L Use of  kick board submerged for increase of abdominal engagement and during trunk rotation 10 to right and 10x to Left   [place] Ragaz, Pt with lumbar belt around hips and nek doodle for neck support. .  Pt assisted into supine floating position by lying head on shoulder of PT to get into floating position.  PT at torso and assisting with trunk left to right and vice versa to engage trunk muscles.  Emphasis on breathing techniques to draw in abdominals for support.  Pt then utilizing posterior chain and engaging Hip extension and knee flexion with water resistance.  LAD of R and L LE with oscillations and relief of Low back and hip spasms. Leg press for strengthening against chest of PT and support of LE's by PT  Pt ended session with 4 x 20 m water jogging.  And jumping jacks x 20  Pt requires the buoyancy of water for active assisted exercises with buoyancy supported for strengthening & ROM exercises: pt requires the viscosity of the water for resistance with strengthening exercises Hydrostatic pressure also supports joints by unweighting joint load by at least 50 % in 3-4 feet depth water. Water will allow for reduced gait deviation due to reduced joint loading through buoyancy to help patient improve posture without excess stress and pain.  Water current provides perturbations which challenge standing balance unsupported                      PT Education - 06/17/20 1739    Education Details educated on Hip hinge in water and stretches /movement to improve AROM on land.  working on [employer] for home use for next visit    Person(s) Educated Patient    Methods Explanation;Demonstration    Comprehension Verbalized understanding;Returned demonstration            PT Short Term Goals - 05/19/20 1432      PT SHORT TERM GOAL #1   Title STG=LTG             PT Long Term Goals - 05/19/20 1432      PT LONG TERM GOAL #1   Title Pt will be independent with all HEP given    Baseline No  knowledge    Time 6    Period Weeks    Status New    Target Date 06/30/20      PT LONG TERM GOAL #2   Title Pt will be able to  perform household chores without exacerbating back pain and using pain managment techniques    Baseline Pt unable to sweep or wash dishes for longer than 10 minutes    Time 6    Period Weeks    Status New    Target Date 06/30/20      PT LONG TERM GOAL #3   Title Pt will be able to perform a 5 x sts in 13 sec or less showing improved LE strength and control without pain    Baseline 5 x STS  23.6 sec    Time 6    Period Weeks    Status New    Target Date 06/30/20      PT LONG TERM GOAL #4   Title FOTO will improve from 28 %   to  45 %   indicating improved functional mobility .    Baseline eval 285    Time 6    Period Weeks    Status New    Target Date 06/30/20      PT LONG TERM GOAL #5   Title Pt will be able to negotiate steps with step over step technique and carrying 20 # of groceries    Baseline unable to negotiate steps , avoids due to pain and does not carry groceries due to pain    Time 6    Period Weeks    Status New    Target Date 06/30/20                 Plan - 06/17/20 1741    Clinical Impression Statement Hannah Azar demonstrates increased confidence in the water this week and was able to continuously participate in exercise for 44 minutes, Pt R buttock pain 7/10 initially but reduced to 4/10 at end of session.  Pt is able to stretch and increased AROM in water especially hip extension/ hip abduction with no increase in pain.  Hydrostatic pressure important and beneficial for unweighting the joints in order for Hannah [name] to ambulate in water with more fluid  motion wthout antalgic gait.  She is working on incorporating her exercises into an HEP to be given next week for home use. Will continue to work on progressive challenge in water.    Personal Factors and Comorbidities Comorbidity 1    Comorbidities R TKA  01-28-2016  bil trigger  release  , hysterectomy,   bil shld OA Left knee OA   HBP    Examination-Activity Limitations Sleep;Squat;Stand;Stairs;Transfers;Locomotion Level;Sit    Examination-Participation Restrictions Occupation    PT Frequency 2x / week    PT Duration 6 weeks    PT Treatment/Interventions ADLs/Self Care Home Management;Aquatic Therapy;Cryotherapy;Electrical Stimulation;Moist Heat;Iontophoresis 39m/ml Dexamethasone;Gait training;Stair training;Functional mobility training;Therapeutic activities;Therapeutic exercise;Balance training;Neuromuscular re-education;Manual techniques;Patient/family education;Passive range of motion;Dry needling;Taping;Joint Manipulations    PT Next Visit Plan Assess TPDN  basic back work on sciatic nerve glides   Check SI    PT Home Exercise Plan [id number]    Consulted and Agree with Plan of Care Patient           Patient will benefit from skilled therapeutic intervention in order to improve the following deficits and impairments:  Obesity,Pain,Postural dysfunction,Improper body mechanics,Difficulty walking,Decreased mobility,Decreased range of motion,Decreased strength,Hypomobility,Impaired flexibility,Increased muscle spasms  Visit Diagnosis: Chronic low back pain with left-sided sciatica, unspecified back pain laterality  Stiffness of left hip, not elsewhere classified  Muscle weakness (generalized)  Cramp and spasm  Difficulty in walking, not elsewhere classified     Problem  List Patient Active Problem List   Diagnosis Date Noted  . Trigger finger, left middle finger 04/09/2020  . Stenosing tenosynovitis of finger of left hand 02/27/2020  . Hemoglobin C trait ([place]) 02/27/2018  . Major depression, recurrent ([place]) 01/12/2018  . Chronic right shoulder pain 12/20/2017  . 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 MDD (major depressive disorder), recurrent episode, moderate ([place]) 11/04/2017  . MGUS (monoclonal gammopathy of unknown significance) 10/24/2017   . Primary osteoarthritis of both hands 09/26/2017  . Primary osteoarthritis of right shoulder 09/26/2017  . Primary osteoarthritis of both hips 09/26/2017  . Status post total knee replacement, right 09/26/2017  . Primary osteoarthritis of both feet 09/26/2017  . History of asthma 09/26/2017  . Primary osteoarthritis of left knee 04/18/2017  . Menorrhagia 11/04/2016  . Fibroid tumor 11/04/2016  . Adenomyosis 11/04/2016  . Hypertension, essential 11/04/2016  . Total knee replacement status 01/28/2016   Voncille Lo, PT, [facility] Certified Exercise Expert for the Aging Adult  06/17/20 6:19 PM Phone: [phone] Fax: [facility] [address] [place], [place], [address] Phone: [phone]   Fax:  [phone]  Name: CLARKE AMBURN MRN: [id number] Date of Birth: 01-07-1961

## 2020-06-24 ENCOUNTER — Ambulatory Visit: Payer: BC Managed Care – PPO | Admitting: Physical Therapy

## 2020-06-24 ENCOUNTER — Encounter: Payer: Self-pay | Admitting: Physical Therapy

## 2020-06-24 ENCOUNTER — Other Ambulatory Visit: Payer: Self-pay

## 2020-06-24 DIAGNOSIS — G8929 Other chronic pain: Secondary | ICD-10-CM | POA: Diagnosis not present

## 2020-06-24 DIAGNOSIS — M6281 Muscle weakness (generalized): Secondary | ICD-10-CM

## 2020-06-24 DIAGNOSIS — M25652 Stiffness of left hip, not elsewhere classified: Secondary | ICD-10-CM

## 2020-06-24 DIAGNOSIS — R252 Cramp and spasm: Secondary | ICD-10-CM

## 2020-06-24 DIAGNOSIS — M5442 Lumbago with sciatica, left side: Secondary | ICD-10-CM

## 2020-06-24 DIAGNOSIS — R262 Difficulty in walking, not elsewhere classified: Secondary | ICD-10-CM

## 2020-06-24 NOTE — Therapy (Signed)
[place], [place], [address] Phone: [phone]   Fax:  [phone]  Physical Therapy Treatment  Patient Details  Name: Hannah [name] MRN: [id number] Date of Birth: Jan 15, 1961 Referring Provider (PT): Frankey Shown MD   Encounter Date: 06/24/2020   PT End of Session - 06/24/20 1649    Visit Number 6    Number of Visits 12    Date for PT Re-Evaluation 06/30/20    Authorization Type BCBS    PT Start Time 1330    PT Stop Time 1414    PT Time Calculation (min) 44 min    Activity Tolerance Patient tolerated treatment well    Behavior During Therapy [facility] for tasks assessed/performed           Past Medical History:  Diagnosis Date  . Allergy   . Anemia   . Anxiety   . Arthritis   . Asthma   . Depression   . Hypertension     Past Surgical History:  Procedure Laterality Date  . CHOLECYSTECTOMY  1993  . COLONOSCOPY    . 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 TOTAL KNEE ARTHROPLASTY Right 01/28/2016   Procedure: RIGHT TOTAL KNEE ARTHROPLASTY;  Surgeon: [name], MD;  Location: [facility];  Service: Orthopedics;  Laterality: Right;  . TRIGGER FINGER RELEASE Right 06/18/2014   Procedure: RIGHT LONG FINGER TRIGGER RELEASE;  Surgeon: [name], MD;  Location: [facility];  Service: Orthopedics;  Laterality: Right;  . TUBAL LIGATION    . VAGINAL HYSTERECTOMY Bilateral 11/04/2016   Procedure: HYSTERECTOMY VAGINAL with Bilateral Salpingectomy;  Surgeon: [name], MD;  Location: [facility];  Service: Gynecology;  Laterality: Bilateral;    There were no vitals filed for this visit.   Subjective Assessment - 06/24/20 1646    Subjective Pt returns for 3rd visit for aquatics at [facility].  with a 6/10 lo back pain into R buttock pain decreasing to a 5/10 after RX today.    Pertinent History R TKA  01-28-2016  bil trigger release  , hysterectomy,   bil shld OA Left knee OA   HBP     Diagnostic tests x rays    Patient Stated Goals I have trouble getting up and down steps trouble sleeping    Currently in Pain? Yes    Pain Score 7     Pain Location Buttocks    Pain Orientation Right    Pain Descriptors / Indicators Aching    Pain Type Chronic pain    Pain Onset More than a month ago    Pain Frequency Intermittent              Aquatic therapy at [facility] - pool temp 86.5degrees Patient seen for aquatic therapy today at [facility] 3rd time.Pt enters GAC without AD and ambulates to handrails by pool.Treatment took place in water 3.5-4 feet deep depending upon activity. Pt entered and exited the pool via ramp negotiation without use of bil handrails and supervision in the water.  Initially, hip hinging against pool wall for external cue, then using pool noodle for cat camel and emphasizing breathing technique.  Pt then did pelvic tilt back against wall and needing cues to perform correctly Runners stretch 2 x Right and then moves into hamstring stretch, Runners stretch 2 x Left and then moves into hamstring stretch.   Ms Hagg using cuff weights for added resistance in water for exercises. On edge of pool  with bil UE support Pt performed LE exercise Hip abd/add R/L 20 x each and then using 1 UE support Hip ext/flex with knee straight x 20, Marching knee/hip 90/90 x 20 and then ham curl R/L x 20.  Hips at 90 degree flexion to 90 degrees abduction for IR/ER AROM of R and then L hip with chest deep water.  No pain with any movements Heel raises in water bil with UE support on pool ledge x 30 Squats x 10 reps with intermittent UE support x 2 sets. Reverse lunge on edge of pool Holding onto ledge, 20 x on R and the 20 x on L Pt ambulates in water with increased stride length  for 20 m x 2, backwards x 2 and lunge walking x 1  Ms Gardenhire was able to use aquatic barbells submerged for increased abdominal engagement with side stepping 20 m x 2.     Bad Ragaz,  Pt with lumbar belt around hips and nek doodle for neck support. Also used pool noodle around buttocks for added flotation. .  Pt assisted into supine floating position by lying head on shoulder of PT to get into floating position.  PT at torso and assisting with trunk left to right and vice versa to engage trunk muscles. PT then rotated trunk in order to engage abdomnal (internal and external obliques)  Emphasis on breathing techniques to draw in abdominals for support.  Pt then utilizing posterior chain and engaging Hip extension and knee flexion with water resistance.  Pt then received underwater manual therapy aqua stretch technique for R low back /buttocks /piriformis utilizing pt hip extension and hip IR/ER for trigger point pain    Pt requires the buoyancy of water for active assisted exercises with buoyancy supported for strengthening& ROM exercises: ptrequires the viscosity of the water forresistance withstrengtheningexercises Hydrostatic pressure also supports joints by unweighting joint load by at least 50 % in 3-4 feet depth water. Water will allow for reduced gait deviation due to reduced joint loading through buoyancy to help patient improve posture without excess stress and pain.  Water current provides perturbations which challenge standing balance unsupported                         PT Education - 06/24/20 1648    Education Details reinforcing HEP for water therapy in order to use for pain management once I with HEP    Person(s) Educated Patient    Methods Explanation;Demonstration;Tactile cues;Verbal cues    Comprehension Verbalized understanding;Returned demonstration            PT Short Term Goals - 05/19/20 1432      PT SHORT TERM GOAL #1   Title STG=LTG             PT Long Term Goals - 05/19/20 1432      PT LONG TERM GOAL #1   Title Pt will be independent with all HEP given    Baseline No knowledge    Time 6    Period Weeks     Status New    Target Date 06/30/20      PT LONG TERM GOAL #2   Title Pt will be able to perform household chores without exacerbating back pain and using pain managment techniques    Baseline Pt unable to sweep or wash dishes for longer than 10 minutes    Time 6    Period Weeks    Status New  Target Date 06/30/20      PT LONG TERM GOAL #3   Title Pt will be able to perform a 5 x sts in 13 sec or less showing improved LE strength and control without pain    Baseline 5 x STS  23.6 sec    Time 6    Period Weeks    Status New    Target Date 06/30/20      PT LONG TERM GOAL #4   Title FOTO will improve from 28 %   to  45 %   indicating improved functional mobility .    Baseline eval 285    Time 6    Period Weeks    Status New    Target Date 06/30/20      PT LONG TERM GOAL #5   Title Pt will be able to negotiate steps with step over step technique and carrying 20 # of groceries    Baseline unable to negotiate steps , avoids due to pain and does not carry groceries due to pain    Time 6    Period Weeks    Status New    Target Date 06/30/20                 Plan - 06/24/20 1650    Clinical Impression Statement Pt with no AD and enters water independently on ramp with 7/10 pain and reduced to 5/10 after RX. Pt session emphasized low back stretching and pain management using sciatic nerve glide in the water.  Pt able to stretch and increase AROM in water with squats and lunge walking showing increased AROM of hip extension.   Hydrostaic pressue beneficial for unweighting the joints in order for Ms Kerman to ambulate in water without gait compensations.  This week pt showed more need for instruction in ordre to execute exercises appropriately with good technique. Aqua stretch technique used  for stretching of low back muscles as well as using Bad Ragaz method to encourage neuromuscular control of back muscles in supine floating position without pain  in R low back    Comorbidities  R TKA  01-28-2016  bil trigger release  , hysterectomy,   bil shld OA Left knee OA   HBP    Examination-Activity Limitations Sleep;Squat;Stand;Stairs;Transfers;Locomotion Level;Sit           Patient will benefit from skilled therapeutic intervention in order to improve the following deficits and impairments:     Visit Diagnosis: Chronic low back pain with left-sided sciatica, unspecified back pain laterality  Stiffness of left hip, not elsewhere classified  Muscle weakness (generalized)  Cramp and spasm  Difficulty in walking, not elsewhere classified     Problem List Patient Active Problem List   Diagnosis Date Noted  . Trigger finger, left middle finger 04/09/2020  . Stenosing tenosynovitis of finger of left hand 02/27/2020  . Hemoglobin C trait ([place]) 02/27/2018  . Major depression, recurrent ([place]) 01/12/2018  . Chronic right shoulder pain 12/20/2017  . 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 MDD (major depressive disorder), recurrent episode, moderate ([place]) 11/04/2017  . MGUS (monoclonal gammopathy of unknown significance) 10/24/2017  . Primary osteoarthritis of both hands 09/26/2017  . Primary osteoarthritis of right shoulder 09/26/2017  . Primary osteoarthritis of both hips 09/26/2017  . Status post total knee replacement, right 09/26/2017  . Primary osteoarthritis of both feet 09/26/2017  . History of asthma 09/26/2017  . Primary osteoarthritis of left knee 04/18/2017  . Menorrhagia 11/04/2016  .  Fibroid tumor 11/04/2016  . Adenomyosis 11/04/2016  . Hypertension, essential 11/04/2016  . Total knee replacement status 01/28/2016    Voncille Lo, PT, [place] Certified Exercise Expert for the Aging Adult  06/24/20 4:57 PM Phone: [phone] Fax: [facility] [address] [place], [place], [address] Phone: [phone]   Fax:  [phone]  Name: Hannah [name] MRN: [id number] Date  of Birth: 03/25/1961

## 2020-06-26 ENCOUNTER — Telehealth: Payer: Self-pay

## 2020-06-26 ENCOUNTER — Other Ambulatory Visit: Payer: Self-pay

## 2020-06-26 DIAGNOSIS — M5442 Lumbago with sciatica, left side: Secondary | ICD-10-CM

## 2020-06-26 DIAGNOSIS — G8929 Other chronic pain: Secondary | ICD-10-CM

## 2020-06-26 MED ORDER — TRAMADOL HCL 50 MG PO TABS: 50.0000 mg | ORAL_TABLET | Freq: Every day | ORAL | 0 refills | Status: DC | PRN

## 2020-06-26 NOTE — Telephone Encounter (Signed)
Called patient no answer LMOM with details.  MRI order made and also told her Dr Erlinda Hong sent in some Tramadol into pharm. She will f/u after MRI

## 2020-06-26 NOTE — Telephone Encounter (Signed)
Patient states she is still having a lot of back pain. She is currently doing PT. She finished Prednisone & Methocarbamol. Would like to know what to do next. Do you want to go ahead and schedule for MRI? Would like to know if she can also get something to help for pain. Please advise.     CB: [phone] Pharm: [facility]

## 2020-06-26 NOTE — Telephone Encounter (Signed)
Yes on MRI.

## 2020-06-26 NOTE — Addendum Note (Signed)
Addended by: Azucena Cecil on: 06/26/2020 01:18 PM   Modules accepted: Orders

## 2020-06-27 ENCOUNTER — Other Ambulatory Visit: Payer: BC Managed Care – PPO

## 2020-06-29 ENCOUNTER — Telehealth: Payer: Self-pay | Admitting: Orthopaedic Surgery

## 2020-06-29 NOTE — Telephone Encounter (Signed)
Called patient left message to return call to schedule an appointment with Dr Erlinda Hong for MRI review after 07/03/2020

## 2020-06-30 ENCOUNTER — Encounter (HOSPITAL_BASED_OUTPATIENT_CLINIC_OR_DEPARTMENT_OTHER): Payer: Self-pay | Admitting: Orthopedic Surgery

## 2020-07-01 ENCOUNTER — Ambulatory Visit: Payer: BC Managed Care – PPO | Attending: Orthopaedic Surgery | Admitting: Physical Therapy

## 2020-07-01 ENCOUNTER — Other Ambulatory Visit: Payer: Self-pay

## 2020-07-01 ENCOUNTER — Other Ambulatory Visit: Payer: Self-pay | Admitting: Physician Assistant

## 2020-07-01 ENCOUNTER — Encounter: Payer: Self-pay | Admitting: Physical Therapy

## 2020-07-01 DIAGNOSIS — G8929 Other chronic pain: Secondary | ICD-10-CM | POA: Diagnosis not present

## 2020-07-01 DIAGNOSIS — M25652 Stiffness of left hip, not elsewhere classified: Secondary | ICD-10-CM | POA: Diagnosis not present

## 2020-07-01 DIAGNOSIS — M5442 Lumbago with sciatica, left side: Secondary | ICD-10-CM | POA: Diagnosis not present

## 2020-07-01 DIAGNOSIS — M6281 Muscle weakness (generalized): Secondary | ICD-10-CM

## 2020-07-01 DIAGNOSIS — R262 Difficulty in walking, not elsewhere classified: Secondary | ICD-10-CM | POA: Diagnosis not present

## 2020-07-01 DIAGNOSIS — R252 Cramp and spasm: Secondary | ICD-10-CM | POA: Diagnosis not present

## 2020-07-01 NOTE — Patient Instructions (Addendum)
Access Code: [facility] URL: [email or web address] Date: 06/30/2020 Prepared by: Voncille Lo  Exercises . Cat Cow in Shallow Water with Pool Noodle - 1 x daily - 1-3 x weekly - 1 sets - 5-10 reps . Full Triangle Pose in [employer] with Pool Noodle - 1 x daily - 1-3 x weekly - 1 sets - 5 reps - 15-30 hold . Lunge to Target at [facility] - 1 x daily - 1-3 x weekly - 2 sets - 10 reps . Forward and Backward Walking Lunge in [employer] - 1 x daily - 1-3 x weekly - 2 sets - 10 reps . Forward Walking Lunge in [employer] - 1 x daily - 1-3 x weekly - 2 sets - 10 reps . Forward Jog in Shallow Water - 1 x daily - 1-3 x weekly - 2 sets - 10 reps   Aquatics Home Program 3 Pool Written Home Exercise All exercises you will feel a stretch but should be PAIN FREE Be aware of neutral spine/Water immersion requires continuous muscle activation with static positioning. You need to be developing muscle endurance - ability to do work over a longer period of time.  1) sitting hip hinge  Sit in waist deep  water  Bend at hips with chest up( show shirt logo) and look up/ chin down Bend forward as far as possible hold 5 sec  x15  2) standing hip hinge  Stand about a foot from wall Cross arms in front of you like holding groceries and shutting car door with buttocks  Try to touch wall with buttocks  Repeat x15   3)  Runner's stretch. use steps in pool and hold onto rail as needed, place right foot on step and bend right knee as far as you can go. This will stretch your left hip flexors as well.  Hold about 10-15 sec and repeat 5 x on Right leg. Repeat with opposite leg 4) Follow with hamstring stretch on step as shown in pool x 5 and hold 15-30 sec  5) Use ball / floating weights/ noodle to press down in water to increase abdominal engagement while walking through the shallow water Home Program has Triangle pose and  Cat Cow with Noodle  Now, Begin with walking back and forth in  water increasing speed to increase strength. Go to pool ledge and perform exercises to warm up 1) Marching in place x 20,  2) With knee straight kick leg forward and backward 20 x (Leg flex/ext) Remember to keep core quiet and engaged as shown in clinic. 3)  With knee straight kick leg across body(leading with heel) and away from body (to the side and back and return to across your body as shown in aquatic therapy x 20. Remember to keep core quiet and engaged. 4) Standing by pool ledge,(hamstrings curl) bend knee (as if you are kicking your buttock with your heel) x 20 5) Internal rotatation/External Rotation of Hips standing with Single limb stance on L and R lower extremity in figure 4 pose. Bring knee to midline and then back 10 x each 6) Heel raises x 30 7) Squat x 20 holding onto pool ledge as deeply as possible 8) Lunge walk across pool 9) Leg circles clockwise and counter clockwise 10 x  10) Using kick board , submerge in water and rotate torso to engage abdominal muscles as shown in water/aquatics    Voncille Lo, PT, [place] Certified Exercise Expert for the Aging Adult  05/13/20 4:01 PM  Phone: [phone] Fax: [phone]

## 2020-07-01 NOTE — Therapy (Addendum)
[place], [place], [address] Phone: [phone]   Fax:  [phone]  Physical Therapy Treatment/Discharge Note  Patient Details  Name: ANJELIQUE MAKAR MRN: [id number] Date of Birth: 1960/12/06 Referring Provider (PT): Frankey Shown MD   Encounter Date: 07/01/2020   PT End of Session - 07/01/20 1714     Visit Number 7    Number of Visits 12    Date for PT Re-Evaluation 06/30/20    Authorization Type BCBS    PT Start Time 1430    PT Stop Time 1515    PT Time Calculation (min) 45 min    Activity Tolerance Patient tolerated treatment well    Behavior During Therapy [facility] for tasks assessed/performed             Past Medical History:  Diagnosis Date   Allergy    Anemia    Anxiety    Arthritis    Asthma    Depression    Hypertension     Past Surgical History:  Procedure Laterality Date   CHOLECYSTECTOMY  1993   COLONOSCOPY     TOTAL KNEE ARTHROPLASTY Right 01/28/2016   TOTAL KNEE ARTHROPLASTY Right 01/28/2016   Procedure: RIGHT TOTAL KNEE ARTHROPLASTY;  Surgeon: [name], MD;  Location: [place];  Service: Orthopedics;  Laterality: Right;   TRIGGER FINGER RELEASE Right 06/18/2014   Procedure: RIGHT LONG FINGER TRIGGER RELEASE;  Surgeon: [name], MD;  Location: [place];  Service: Orthopedics;  Laterality: Right;   TUBAL LIGATION     VAGINAL HYSTERECTOMY Bilateral 11/04/2016   Procedure: HYSTERECTOMY VAGINAL with Bilateral Salpingectomy;  Surgeon: [name], MD;  Location: [facility];  Service: Gynecology;  Laterality: Bilateral;    There were no vitals filed for this visit.   Subjective Assessment - 07/01/20 1710     Subjective Pt arrives late at [facility].  she reports she is a 7/10 after walking from parking lot to the pool at the new area.  Pt states she has an MRI scheduled for Friday as well as a foot surgery scheduled for 07-07-20.     Pertinent History R TKA  01-28-2016  bil trigger release  , hysterectomy,   bil shld OA Left knee OA   HBP    Patient Stated Goals I have trouble getting up and down steps trouble sleeping    Currently in Pain? Yes    Pain Score 7     Pain Location Buttocks    Pain Orientation Right    Pain Descriptors / Indicators Aching;Throbbing    Pain Type Chronic pain    Pain Onset More than a month ago    Pain Frequency Intermittent               Aquatic therapy at [address] - therapeutic pool temp 88 to 90 degrees Patient seen for aquatic therapy today for 4th time and to reinforce and review HEP for home use.   Pt enters building without AD and is escorted to pool area.  Pt  Ambulates slowly and with decreased stride length/ antalgic gate. Treatment took place in water 3.0 to 5.feet deep depending upon activity.  Pt entered and exited the pool via stair and handrails.  Ai Chi  for deep breathing and pain control using slow controlled Tai chi like movements with shoulders submerged to shoulder level.  Ai Chi  posture of floating, uplifting, enclosing folding, soothing,  gathering and freeing motions x 15 reps . - cues given to keep core tight for improved trunk stabilization; shifting and  Balancing posture 10 reps each with pt holding onto edge of pool with 1 UE  for assist with balance   Going over HEP for home use including  hip hinging in sitting with chest submerged on pool seat x 10 and then  against pool wall for external cue, then using pool noodle for cat camel and emphasizing breathing technique.  Pt then did pelvic tilt back against wall and needing cues to perform correctly Runners stretch 2 x Right and then moves into hamstring stretch,  Runners stretch  2 x Left and then moves into hamstring stretch.    Ms Schou using cuff weights to LE for added resistance in water for exercises. On edge of pool with bil UE support  Pt performed LE exercise  Hip abd/add R/L 10 x  each and then using 1 UE support Hip ext/flex with knee straight x 20, Marching knee/hip 90/90 x 20 and then ham curl R/L x 20.  Hips at 90 degree flexion to 90 degrees abduction for IR/ER AROM of R and then L hip with chest deep water.  No pain with any movements in water Squats x 10 reps with intermittent UE support x 2 sets. Lunge to target x 10 on R and the x 10 on LE Use of pool noodle for Triangle pose to pt tolerance  Pt ambulates in water with increased stride length  for 20 m x 2, backwards x 2 and lunge walking x 1   Ms Skousen was able to use aquatic barbells submerged for increased abdominal engagement  with side stepping  15 ft x 4, backwards 15 ft x 4 and forward stepping 15 ft x 4.        Bad Ragaz, Pt with lumbar belt around hips and nek doodle for neck support. Also used pool noodle around buttocks for added flotation. .  Pt assisted into supine floating position by lying head on shoulder of PT to get into floating position.  PT at torso and assisting with trunk left to right and vice versa to engage trunk muscles. PT then rotated trunk in order to engage abdomnal (internal and external obliques)  Emphasis on breathing techniques to draw in abdominals for support.  Pt then utilizing posterior chain and engaging Hip extension and knee flexion with water resistance.     Pt requires the buoyancy of water for active assisted exercises with buoyancy supported for strengthening & ROM exercises: pt requires the viscosity of the water for resistance with strengthening exercises Hydrostatic pressure also supports joints by unweighting joint load by at least 50 % in 3-4 feet depth water. 80% in chest to neck deep water. Water will allow for reduced gait deviation due to reduced joint loading through buoyancy to help patient improve posture without excess stress and pain.  Water current provides perturbations which challenge standing balance  unsupported                        PT Education - 07/01/20 1712     Education Details Pt given written copy of HEP and went over HEP in water for home use after she attends to future surgery of foot    Person(s) Educated Patient    Methods Explanation;Demonstration;Tactile cues;Verbal cues;Handout    Comprehension Verbalized understanding;Returned demonstration  PT Short Term Goals - 05/19/20 1432       PT SHORT TERM GOAL #1   Title STG=LTG               PT Long Term Goals - 05/19/20 1432       PT LONG TERM GOAL #1   Title Pt will be independent with all HEP given    Baseline No knowledge    Time 6    Period Weeks    Status New    Target Date 06/30/20      PT LONG TERM GOAL #2   Title Pt will be able to perform household chores without exacerbating back pain and using pain managment techniques    Baseline Pt unable to sweep or wash dishes for longer than 10 minutes    Time 6    Period Weeks    Status New    Target Date 06/30/20      PT LONG TERM GOAL #3   Title Pt will be able to perform a 5 x sts in 13 sec or less showing improved LE strength and control without pain    Baseline 5 x STS  23.6 sec    Time 6    Period Weeks    Status New    Target Date 06/30/20      PT LONG TERM GOAL #4   Title FOTO will improve from 28 %   to  45 %   indicating improved functional mobility .    Baseline eval 285    Time 6    Period Weeks    Status New    Target Date 06/30/20      PT LONG TERM GOAL #5   Title Pt will be able to negotiate steps with step over step technique and carrying 20 # of groceries    Baseline unable to negotiate steps , avoids due to pain and does not carry groceries due to pain    Time 6    Period Weeks    Status New    Target Date 06/30/20              Access Code: [facility] URL: [email or web address] Date: 06/30/2020 Prepared by: Garen Lah  Exercises Cat Cow in Shallow Water  with Pool Noodle - 1 x daily - 1-3 x weekly - 1 sets - 5-10 reps Full Triangle Pose in Shallow Water with Pool Noodle - 1 x daily - 1-3 x weekly - 1 sets - 5 reps - 15-30 hold Lunge to Target at [employer] - 1 x daily - 1-3 x weekly - 2 sets - 10 reps Forward and Backward Walking Lunge in Shallow Water - 1 x daily - 1-3 x weekly - 2 sets - 10 reps Forward Walking Lunge in Shallow Water - 1 x daily - 1-3 x weekly - 2 sets - 10 reps Forward Jog in Shallow Water - 1 x daily - 1-3 x weekly - 2 sets - 10 reps       Plan - 07/01/20 1532     Clinical Impression Statement Ms Tang enters [facility] for aquatic therapy for 4th visit.  Pt is scheduled for MRI on Friday feb. 4 and has scheduled foot surgery following week.  Pt was given HEP including AROM/strengthening and AiChi instructions for home use.  Pt enters clinic with antalgic gait and 7/10 pain in low back to Right buttock.  Pt is able to fully participate with all exericses in  water with no exacerbation of pain.  Pt benefits from the hydrostaic pressure unweighting her joints in order to utililze full AROM of joints.  Ai Chi helps Ms Clendenin be able to regulate and control her pain and should continue to use technique in her tools for pain management.   Pt reviewed with PT her HEP and was able to clarify and ask any questions in while participating in the aquatic therapy. Pt will continue land PT as able between medical appt and procedures.    Personal Factors and Comorbidities Comorbidity 1    Comorbidities R TKA  01-28-2016  bil trigger release  , hysterectomy,   bil shld OA Left knee OA   HBP    Examination-Activity Limitations Sleep;Squat;Stand;Stairs;Transfers;Locomotion Level;Sit    Examination-Participation Restrictions Occupation    Rehab Potential Good    PT Frequency 2x / week    PT Duration 6 weeks    PT Treatment/Interventions ADLs/Self Care Home Management;Aquatic Therapy;Cryotherapy;Electrical Stimulation;Moist  Heat;Iontophoresis 8m/ml Dexamethasone;Gait training;Stair training;Functional mobility training;Therapeutic activities;Therapeutic exercise;Balance training;Neuromuscular re-education;Manual techniques;Patient/family education;Passive range of motion;Dry needling;Taping;Joint Manipulations    PT Next Visit Plan ERO next  Land visit . Assess TPDN  basic back work on sciatic nerve glides   Check SI    PT Home Exercise Plan [id number]    Consulted and Agree with Plan of Care Patient             Patient will benefit from skilled therapeutic intervention in order to improve the following deficits and impairments:  Obesity,Pain,Postural dysfunction,Improper body mechanics,Difficulty walking,Decreased mobility,Decreased range of motion,Decreased strength,Hypomobility,Impaired flexibility,Increased muscle spasms  Visit Diagnosis: Chronic low back pain with left-sided sciatica, unspecified back pain laterality  Stiffness of left hip, not elsewhere classified  Muscle weakness (generalized)  Cramp and spasm  Difficulty in walking, not elsewhere classified     Problem List Patient Active Problem List   Diagnosis Date Noted   Trigger finger, left middle finger 04/09/2020   Stenosing tenosynovitis of finger of left hand 02/27/2020   Hemoglobin C trait ([place] 02/27/2018   Major depression, recurrent ([place] 01/12/2018   Chronic right shoulder pain 12/20/2017   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   MDD (major depressive disorder), recurrent episode, moderate ([place] 11/04/2017   MGUS (monoclonal gammopathy of unknown significance) 10/24/2017   Primary osteoarthritis of both hands 09/26/2017   Primary osteoarthritis of right shoulder 09/26/2017   Primary osteoarthritis of both hips 09/26/2017   Status post total knee replacement, right 09/26/2017   Primary osteoarthritis of both feet 09/26/2017   History of asthma 09/26/2017   Primary osteoarthritis of left knee 04/18/2017    Menorrhagia 11/04/2016   Fibroid tumor 11/04/2016   Adenomyosis 11/04/2016   Hypertension, essential 11/04/2016   Total knee replacement status 01/28/2016    LVoncille Lo PT, AFort Campbell NorthCertified Exercise Expert for the Aging Adult  07/01/20 7:17 PM Phone: [phone]: 3[facility] [place]GT[place] [address]Phone: [phone]  Fax:  [phone] Name: JARELYS GLASSCOMRN: [id number] of Birth: 811-Jan-1962 PHYSICAL THERAPY DISCHARGE SUMMARY  Visits from Start of Care: 7  Current functional level related to goals / functional outcomes: unknown   Remaining deficits: unknown   Education / Equipment: Aquatic therapy and HEP   Patient agrees to discharge. Patient goals were not met. Patient is being discharged due to not returning since the last visit.  LVoncille Lo PT, AParkerCertified Exercise Expert for the Aging Adult  04/06/21  2:51 PM Phone: [phone] Fax: [phone]

## 2020-07-02 ENCOUNTER — Other Ambulatory Visit (HOSPITAL_COMMUNITY): Payer: BC Managed Care – PPO

## 2020-07-03 ENCOUNTER — Other Ambulatory Visit (HOSPITAL_COMMUNITY)
Admission: RE | Admit: 2020-07-03 | Discharge: 2020-07-03 | Disposition: A | Discharge status: Home / Self Care | Payer: BC Managed Care – PPO | Source: Ambulatory Visit | Attending: Orthopedic Surgery | Admitting: Orthopedic Surgery

## 2020-07-03 ENCOUNTER — Ambulatory Visit
Admission: RE | Admit: 2020-07-03 | Discharge: 2020-07-03 | Disposition: A | Discharge status: Home / Self Care | Payer: BC Managed Care – PPO | Source: Ambulatory Visit | Attending: Orthopaedic Surgery | Admitting: Orthopaedic Surgery

## 2020-07-03 ENCOUNTER — Other Ambulatory Visit: Payer: Self-pay

## 2020-07-03 ENCOUNTER — Encounter (HOSPITAL_BASED_OUTPATIENT_CLINIC_OR_DEPARTMENT_OTHER)
Admission: RE | Admit: 2020-07-03 | Discharge: 2020-07-03 | Disposition: A | Discharge status: Home / Self Care | Payer: BC Managed Care – PPO | Source: Ambulatory Visit | Attending: Orthopedic Surgery | Admitting: Orthopedic Surgery

## 2020-07-03 DIAGNOSIS — Z01812 Encounter for preprocedural laboratory examination: Secondary | ICD-10-CM | POA: Diagnosis not present

## 2020-07-03 DIAGNOSIS — Z01818 Encounter for other preprocedural examination: Secondary | ICD-10-CM | POA: Diagnosis not present

## 2020-07-03 DIAGNOSIS — G8929 Other chronic pain: Secondary | ICD-10-CM

## 2020-07-03 DIAGNOSIS — Z20822 Contact with and (suspected) exposure to covid-19: Secondary | ICD-10-CM | POA: Insufficient documentation

## 2020-07-03 DIAGNOSIS — M545 Low back pain, unspecified: Secondary | ICD-10-CM | POA: Diagnosis not present

## 2020-07-03 LAB — BASIC METABOLIC PANEL
Anion gap: 14 (ref 5–15)
BUN: 13 mg/dL (ref 6–20)
CO2: 26 mmol/L (ref 22–32)
Calcium: 9.4 mg/dL (ref 8.9–10.3)
Chloride: 99 mmol/L (ref 98–111)
Creatinine, Ser: 0.86 mg/dL (ref 0.44–1.00)
GFR, Estimated: >60 mL/min (ref >60)
Glucose, Bld: 84 mg/dL (ref 70–99)
Potassium: 3.4 mmol/L — ABNORMAL LOW (ref 3.5–5.1)
Sodium: 139 mmol/L (ref 135–145)

## 2020-07-03 LAB — SARS CORONAVIRUS 2 (TAT 6-24 HRS): SARS Coronavirus 2: NEGATIVE

## 2020-07-03 NOTE — Progress Notes (Signed)

## 2020-07-03 NOTE — Progress Notes (Signed)
Can you call her about the result.  The report mentions multiple myeloma.

## 2020-07-05 NOTE — Progress Notes (Signed)
I left her a Management consultant.  I'll call her again tomorrow.

## 2020-07-05 NOTE — Progress Notes (Signed)
Sure.  Hannah [name] give her a call tomorrow.  I'm sure she will ask about what's next and i'm not exactly sure what I tell her?  Urgent referral to oncology?

## 2020-07-06 ENCOUNTER — Encounter: Payer: Self-pay | Admitting: Orthopaedic Surgery

## 2020-07-06 ENCOUNTER — Encounter: Payer: Self-pay | Admitting: Hematology

## 2020-07-06 ENCOUNTER — Telehealth: Payer: Self-pay | Admitting: Orthopaedic Surgery

## 2020-07-06 ENCOUNTER — Ambulatory Visit: Payer: BC Managed Care – PPO | Admitting: Physical Therapy

## 2020-07-06 NOTE — Telephone Encounter (Signed)
Pt called and would like to talk about her MRI results. Please call her at [phone]

## 2020-07-06 NOTE — Anesthesia Preprocedure Evaluation (Signed)
Anesthesia Evaluation  Patient identified by MRN, date of birth, ID band Patient awake    Reviewed: Allergy & Precautions, NPO status , Patient's Chart, lab work & pertinent test results  History of Anesthesia Complications Negative for: history of anesthetic complications  Airway Mallampati: II  TM Distance: >3 FB Neck ROM: Full    Dental  (+) Dental Advisory Given, Teeth Intact   Pulmonary asthma ,    Pulmonary exam normal        Cardiovascular hypertension, Pt. on medications Normal cardiovascular exam     Neuro/Psych PSYCHIATRIC DISORDERS Anxiety Depression negative neurological ROS     GI/Hepatic negative GI ROS, Neg liver ROS,   Endo/Other   Obesity   Renal/GU negative Renal ROS     Musculoskeletal  (+) Arthritis ,   Abdominal   Peds  Hematology negative hematology ROS (+)   Anesthesia Other Findings Covid test negative   Reproductive/Obstetrics                            Anesthesia Physical Anesthesia Plan  ASA: II  Anesthesia Plan: General   Post-op Pain Management:  Regional for Post-op pain   Induction: Intravenous  PONV Risk Score and Plan: 3 and Treatment may vary due to age or medical condition, Ondansetron, Dexamethasone and Midazolam  Airway Management Planned: LMA  Additional Equipment: None  Intra-op Plan:   Post-operative Plan: Extubation in OR  Informed Consent: I have reviewed the patients History and Physical, chart, labs and discussed the procedure including the risks, benefits and alternatives for the proposed anesthesia with the patient or authorized representative who has indicated his/her understanding and acceptance.     Dental advisory given  Plan Discussed with: CRNA and Anesthesiologist  Anesthesia Plan Comments:        Anesthesia Quick Evaluation

## 2020-07-06 NOTE — Telephone Encounter (Signed)
Please call patient with MRI Results; Thanks.

## 2020-07-06 NOTE — Progress Notes (Signed)
Dr. Burr Medico, I wanted to let you know about these recent findings for Hannah [name] on this MRI we obtained.  I just spoke to her and she will be following up with you ASAP to talk about treatment options.  Thank you.

## 2020-07-06 NOTE — Progress Notes (Signed)
Ok, thank you so much!

## 2020-07-07 ENCOUNTER — Ambulatory Visit (HOSPITAL_BASED_OUTPATIENT_CLINIC_OR_DEPARTMENT_OTHER)
Admission: RE | Admit: 2020-07-07 | Discharge: 2020-07-07 | Disposition: A | Discharge status: Home / Self Care | Payer: BC Managed Care – PPO | Attending: Orthopedic Surgery | Admitting: Orthopedic Surgery

## 2020-07-07 ENCOUNTER — Ambulatory Visit (HOSPITAL_BASED_OUTPATIENT_CLINIC_OR_DEPARTMENT_OTHER): Payer: BC Managed Care – PPO | Admitting: Anesthesiology

## 2020-07-07 ENCOUNTER — Telehealth: Payer: Self-pay | Admitting: Orthopaedic Surgery

## 2020-07-07 ENCOUNTER — Other Ambulatory Visit: Payer: Self-pay

## 2020-07-07 ENCOUNTER — Encounter (HOSPITAL_BASED_OUTPATIENT_CLINIC_OR_DEPARTMENT_OTHER)
Admission: RE | Disposition: A | Discharge status: Home / Self Care | Payer: Self-pay | Source: Home / Self Care | Attending: Orthopedic Surgery

## 2020-07-07 ENCOUNTER — Encounter (HOSPITAL_BASED_OUTPATIENT_CLINIC_OR_DEPARTMENT_OTHER): Payer: Self-pay | Admitting: Orthopedic Surgery

## 2020-07-07 DIAGNOSIS — Z91048 Other nonmedicinal substance allergy status: Secondary | ICD-10-CM | POA: Insufficient documentation

## 2020-07-07 DIAGNOSIS — M205X1 Other deformities of toe(s) (acquired), right foot: Secondary | ICD-10-CM

## 2020-07-07 DIAGNOSIS — Z7951 Long term (current) use of inhaled steroids: Secondary | ICD-10-CM | POA: Insufficient documentation

## 2020-07-07 DIAGNOSIS — Z79899 Other long term (current) drug therapy: Secondary | ICD-10-CM | POA: Insufficient documentation

## 2020-07-07 DIAGNOSIS — M7741 Metatarsalgia, right foot: Secondary | ICD-10-CM | POA: Insufficient documentation

## 2020-07-07 DIAGNOSIS — I10 Essential (primary) hypertension: Secondary | ICD-10-CM | POA: Diagnosis not present

## 2020-07-07 DIAGNOSIS — G8918 Other acute postprocedural pain: Secondary | ICD-10-CM | POA: Diagnosis not present

## 2020-07-07 DIAGNOSIS — M19071 Primary osteoarthritis, right ankle and foot: Secondary | ICD-10-CM | POA: Diagnosis not present

## 2020-07-07 DIAGNOSIS — Q6689 Other  specified congenital deformities of feet: Secondary | ICD-10-CM | POA: Diagnosis not present

## 2020-07-07 HISTORY — PX: WEIL OSTEOTOMY: SHX5044

## 2020-07-07 SURGERY — OSTEOTOMY, WEIL
Anesthesia: General | Site: Foot | Laterality: Right

## 2020-07-07 MED ORDER — BUPIVACAINE HCL (PF) 0.25 % IJ SOLN
INTRAMUSCULAR | Status: AC
  Filled 2020-07-07: qty 30

## 2020-07-07 MED ORDER — CEFAZOLIN SODIUM-DEXTROSE 2-4 GM/100ML-% IV SOLN: 2.0000 g | INTRAVENOUS | Status: DC

## 2020-07-07 MED ORDER — BUPIVACAINE-EPINEPHRINE (PF) 0.5% -1:200000 IJ SOLN
INTRAMUSCULAR | Status: DC | PRN
  Administered 2020-07-07: 30 mL via PERINEURAL

## 2020-07-07 MED ORDER — DEXAMETHASONE SODIUM PHOSPHATE 10 MG/ML IJ SOLN
INTRAMUSCULAR | Status: AC
  Filled 2020-07-07: qty 1

## 2020-07-07 MED ORDER — ONDANSETRON HCL 4 MG/2ML IJ SOLN
INTRAMUSCULAR | Status: DC | PRN
  Administered 2020-07-07: 4 mg via INTRAVENOUS

## 2020-07-07 MED ORDER — FENTANYL CITRATE (PF) 100 MCG/2ML IJ SOLN
50.0000 ug | Freq: Once | INTRAMUSCULAR | Status: AC
  Administered 2020-07-07: 50 ug via INTRAVENOUS

## 2020-07-07 MED ORDER — FENTANYL CITRATE (PF) 100 MCG/2ML IJ SOLN
INTRAMUSCULAR | Status: AC
  Filled 2020-07-07: qty 2

## 2020-07-07 MED ORDER — PHENYLEPHRINE HCL (PRESSORS) 10 MG/ML IV SOLN
INTRAVENOUS | Status: DC | PRN
  Administered 2020-07-07: 200 ug via INTRAVENOUS

## 2020-07-07 MED ORDER — MIDAZOLAM HCL 2 MG/2ML IJ SOLN
INTRAMUSCULAR | Status: AC
  Filled 2020-07-07: qty 2

## 2020-07-07 MED ORDER — LIDOCAINE HCL (CARDIAC) PF 100 MG/5ML IV SOSY
PREFILLED_SYRINGE | INTRAVENOUS | Status: DC | PRN
  Administered 2020-07-07: 40 mg via INTRAVENOUS

## 2020-07-07 MED ORDER — OXYCODONE-ACETAMINOPHEN 5-325 MG PO TABS: 1.0000 | ORAL_TABLET | ORAL | 0 refills | Status: DC | PRN

## 2020-07-07 MED ORDER — MIDAZOLAM HCL 2 MG/2ML IJ SOLN
2.0000 mg | Freq: Once | INTRAMUSCULAR | Status: AC
  Administered 2020-07-07: 2 mg via INTRAVENOUS

## 2020-07-07 MED ORDER — PROMETHAZINE HCL 25 MG/ML IJ SOLN
6.2500 mg | INTRAMUSCULAR | Status: DC | PRN
Start: 2020-07-07 — End: 2020-07-07

## 2020-07-07 MED ORDER — ONDANSETRON HCL 4 MG/2ML IJ SOLN
INTRAMUSCULAR | Status: AC
  Filled 2020-07-07: qty 2

## 2020-07-07 MED ORDER — PROPOFOL 10 MG/ML IV BOLUS
INTRAVENOUS | Status: DC | PRN
  Administered 2020-07-07: 200 mg via INTRAVENOUS

## 2020-07-07 MED ORDER — OXYCODONE HCL 5 MG PO TABS
5.0000 mg | ORAL_TABLET | Freq: Once | ORAL | Status: DC | PRN
Start: 2020-07-07 — End: 2020-07-07

## 2020-07-07 MED ORDER — OXYCODONE HCL 5 MG/5ML PO SOLN
5.0000 mg | Freq: Once | ORAL | Status: DC | PRN
Start: 2020-07-07 — End: 2020-07-07

## 2020-07-07 MED ORDER — PHENYLEPHRINE HCL (PRESSORS) 10 MG/ML IV SOLN
INTRAVENOUS | Status: DC | PRN
  Administered 2020-07-07 (×2): 80 ug via INTRAVENOUS

## 2020-07-07 MED ORDER — CEFAZOLIN SODIUM-DEXTROSE 2-4 GM/100ML-% IV SOLN
INTRAVENOUS | Status: AC
  Filled 2020-07-07: qty 100

## 2020-07-07 MED ORDER — LIDOCAINE 2% (20 MG/ML) 5 ML SYRINGE
INTRAMUSCULAR | Status: AC
  Filled 2020-07-07: qty 5

## 2020-07-07 MED ORDER — LACTATED RINGERS IV SOLN: INTRAVENOUS | Status: DC

## 2020-07-07 MED ORDER — BUPIVACAINE HCL (PF) 0.5 % IJ SOLN
INTRAMUSCULAR | Status: AC
  Filled 2020-07-07: qty 30

## 2020-07-07 MED ORDER — PROPOFOL 10 MG/ML IV BOLUS
INTRAVENOUS | Status: AC
  Filled 2020-07-07: qty 40

## 2020-07-07 MED ORDER — LACTATED RINGERS IV SOLN: INTRAVENOUS | Status: DC | PRN

## 2020-07-07 MED ORDER — DEXAMETHASONE SODIUM PHOSPHATE 10 MG/ML IJ SOLN
INTRAMUSCULAR | Status: DC | PRN
  Administered 2020-07-07: 5 mg via INTRAVENOUS

## 2020-07-07 MED ORDER — FENTANYL CITRATE (PF) 100 MCG/2ML IJ SOLN
25.0000 ug | INTRAMUSCULAR | Status: DC | PRN
Start: 2020-07-07 — End: 2020-07-07
  Administered 2020-07-07: 25 ug via INTRAVENOUS

## 2020-07-07 SURGICAL SUPPLY — 50 items
BIT DRILL PROS QC 1.5 (BIT) ×1 IMPLANT
BLADE OSC/SAG .038X5.5 CUT EDG (BLADE) ×4 IMPLANT
BLADE SURG 15 STRL LF DISP TIS (BLADE) ×2 IMPLANT
BLADE SURG 15 STRL SS (BLADE) ×4
BNDG CMPR 9X4 STRL LF SNTH (GAUZE/BANDAGES/DRESSINGS)
BNDG COHESIVE 4X5 TAN STRL (GAUZE/BANDAGES/DRESSINGS) ×2 IMPLANT
BNDG ESMARK 4X9 LF (GAUZE/BANDAGES/DRESSINGS) IMPLANT
BNDG GAUZE ELAST 4 BULKY (GAUZE/BANDAGES/DRESSINGS) ×2 IMPLANT
CAP PIN PROTECTOR ORTHO WHT (CAP) ×1 IMPLANT
COVER BACK TABLE 60X90IN (DRAPES) ×2 IMPLANT
COVER WAND RF STERILE (DRAPES) IMPLANT
DECANTER SPIKE VIAL GLASS SM (MISCELLANEOUS) IMPLANT
DRAPE EXTREMITY T 121X128X90 (DISPOSABLE) ×2 IMPLANT
DRAPE IMP U-DRAPE 54X76 (DRAPES) ×2 IMPLANT
DRAPE OEC MINIVIEW 54X84 (DRAPES) IMPLANT
DRAPE U-SHAPE 47X51 STRL (DRAPES) ×2 IMPLANT
DRSG EMULSION OIL 3X3 NADH (GAUZE/BANDAGES/DRESSINGS) ×2 IMPLANT
DRSG PAD ABDOMINAL 8X10 ST (GAUZE/BANDAGES/DRESSINGS) ×2 IMPLANT
DURAPREP 26ML APPLICATOR (WOUND CARE) ×2 IMPLANT
ELECT REM PT RETURN 9FT ADLT (ELECTROSURGICAL) ×2
ELECTRODE REM PT RTRN 9FT ADLT (ELECTROSURGICAL) ×1 IMPLANT
GAUZE 4X4 16PLY RFD (DISPOSABLE) IMPLANT
GAUZE SPONGE 4X4 12PLY STRL (GAUZE/BANDAGES/DRESSINGS) ×2 IMPLANT
GLOVE SURG ORTHO 9.0 STRL STRW (GLOVE) ×2 IMPLANT
GLOVE SURG UNDER POLY LF SZ7 (GLOVE) ×1 IMPLANT
GLOVE SURG UNDER POLY LF SZ9 (GLOVE) ×2 IMPLANT
GOWN STRL REUS W/ TWL LRG LVL3 (GOWN DISPOSABLE) ×1 IMPLANT
GOWN STRL REUS W/TWL LRG LVL3 (GOWN DISPOSABLE) ×2
GOWN STRL REUS W/TWL XL LVL3 (GOWN DISPOSABLE) ×2 IMPLANT
K-WIRE .045X4 (WIRE) ×2 IMPLANT
NDL HYPO 25X1 1.5 SAFETY (NEEDLE) IMPLANT
NEEDLE HYPO 25X1 1.5 SAFETY (NEEDLE) IMPLANT
NS IRRIG 1000ML POUR BTL (IV SOLUTION) ×2 IMPLANT
PACK BASIN DAY SURGERY FS (CUSTOM PROCEDURE TRAY) ×2 IMPLANT
PAD CAST 4YDX4 CTTN HI CHSV (CAST SUPPLIES) ×1 IMPLANT
PADDING CAST ABS 4INX4YD NS (CAST SUPPLIES) ×1
PADDING CAST ABS COTTON 4X4 ST (CAST SUPPLIES) ×1 IMPLANT
PADDING CAST COTTON 4X4 STRL (CAST SUPPLIES) ×2
PENCIL SMOKE EVACUATOR (MISCELLANEOUS) ×2 IMPLANT
SCREW CORTEX ST 2.0X10 (Screw) ×1 IMPLANT
SCREW CORTEX ST 2.0X12 (Screw) ×2 IMPLANT
SPONGE LAP 18X18 RF (DISPOSABLE) ×2 IMPLANT
STOCKINETTE 6  STRL (DRAPES) ×2
STOCKINETTE 6 STRL (DRAPES) ×1 IMPLANT
SUT ETHILON 2 0 FSLX (SUTURE) ×2 IMPLANT
SUT VIC AB 2-0 CT1 27 (SUTURE)
SUT VIC AB 2-0 CT1 TAPERPNT 27 (SUTURE) IMPLANT
SYR BULB EAR ULCER 3OZ GRN STR (SYRINGE) ×2 IMPLANT
SYR CONTROL 10ML LL (SYRINGE) IMPLANT
TOWEL GREEN STERILE FF (TOWEL DISPOSABLE) ×2 IMPLANT

## 2020-07-07 NOTE — Transfer of Care (Signed)
Immediate Anesthesia Transfer of Care Note  Patient: Hannah [name]  Procedure(s) Performed: RIGHT FOOT WEIL OSTEOTOMY 2, 3, AND 4 METATARSALS AND PROXIMAL INTERPHALANGEAL JOINT FUSION 2 & 4 TOES (Right Foot)  Patient Location: PACU  Anesthesia Type:General and Regional  Level of Consciousness: awake, alert  and oriented  Airway & Oxygen Therapy: Patient Spontanous Breathing and Patient connected to face mask oxygen  Post-op Assessment: Report given to RN and Post -op Vital signs reviewed and stable  Post vital signs: Reviewed and stable  Last Vitals:  Vitals Value Taken Time  BP    Temp    Pulse    Resp    SpO2      Last Pain:  Vitals:   07/07/20 0647  TempSrc: Oral  PainSc:          Complications: No complications documented.

## 2020-07-07 NOTE — Discharge Instructions (Signed)

## 2020-07-07 NOTE — Telephone Encounter (Signed)
Does she still need to come in on Friday

## 2020-07-07 NOTE — H&P (Signed)
Hannah [name] is an 60 y.o. female.   Chief Complaint: Right Foot Metatarsalgia HPI: Patient is a 60 year old woman who presents in referral from Dr.Xu for initial evaluation of clawing of the right foot second and fourth toes she states she has no active extension she states that with weightbearing she has pain over the tips of the toes and feels like her gait is unstable from the floppy toes.  Past Medical History:  Diagnosis Date  . Allergy   . Anemia   . Anxiety   . Arthritis   . Asthma   . Depression   . Hypertension     Past Surgical History:  Procedure Laterality Date  . CHOLECYSTECTOMY  1993  . COLONOSCOPY    . 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 TOTAL KNEE ARTHROPLASTY Right 01/28/2016   Procedure: RIGHT TOTAL KNEE ARTHROPLASTY;  Surgeon: [name], MD;  Location: [place];  Service: Orthopedics;  Laterality: Right;  . TRIGGER FINGER RELEASE Right 06/18/2014   Procedure: RIGHT LONG FINGER TRIGGER RELEASE;  Surgeon: [name], MD;  Location: [place];  Service: Orthopedics;  Laterality: Right;  . TUBAL LIGATION    . VAGINAL HYSTERECTOMY Bilateral 11/04/2016   Procedure: HYSTERECTOMY VAGINAL with Bilateral Salpingectomy;  Surgeon: [name], MD;  Location: [facility];  Service: Gynecology;  Laterality: Bilateral;    Family History  Problem Relation Age of Onset  . Cancer Mother        uterine   . Asthma Mother   . Arthritis Daughter   . Depression Maternal Uncle   . Colon cancer Neg Hx    Social History:  reports that she has never smoked. She has never used smokeless tobacco. She reports that she does not drink alcohol and does not use drugs.  Allergies:  Allergies  Allergen Reactions  . Elemental Sulfur Swelling    Facial swelling     Medications Prior to Admission  Medication Sig Dispense Refill  . budesonide-formoterol (SYMBICORT) 160-4.5 MCG/ACT inhaler Inhale 2 puffs into the lungs daily as needed.    . chlorthalidone  (HYGROTON) 25 MG tablet TAKE 1 TABLET BY MOUTH EVERY DAY FOR HTN  3  . ferrous sulfate 325 (65 FE) MG tablet Take 325 mg by mouth 2 (two) times daily with a meal.    . ibuprofen (ADVIL,MOTRIN) 600 MG tablet 600 MG BY MOUTH EVERY 6 HOURS FOR 5 DAYS, THEN EVERY 6 HOURS AS NEEDED FOR PAIN.  DON'T TAKE MOBIC WHILE TAKING IBUPROFEN 60 tablet 0  . methocarbamol (ROBAXIN) 500 MG tablet Take 1 tablet (500 mg total) by mouth 2 (two) times daily as needed. 20 tablet 0  . naproxen sodium (ALEVE) 220 MG tablet Take 220 mg by mouth as needed.    . traMADol (ULTRAM) 50 MG tablet Take 1-2 tablets (50-100 mg total) by mouth daily as needed. 20 tablet 0  . valACYclovir (VALTREX) 500 MG tablet Take 500 mg by mouth 2 (two) times daily.    . Vitamin D, Ergocalciferol, (DRISDOL) 50000 units CAPS capsule Take 50,000 Units by mouth every Monday.     Marland Kitchen buPROPion (WELLBUTRIN XL) 150 MG 24 hr tablet 450 mg daily (150 mg + 300 mg) 30 tablet 0  . sertraline (ZOLOFT) 50 MG tablet Take 1 tablet (50 mg total) by mouth at bedtime. 90 tablet 0  . traZODone (DESYREL) 50 MG tablet Take 1 tablet (50 mg total) by mouth at bedtime. 90 tablet 0    No  results found for this or any previous visit (from the past 68 hour(s)). No results found.  Review of Systems  All other systems reviewed and are negative.   Blood pressure 114/75, pulse 89, temperature 98.3 F (36.8 C), temperature source Oral, resp. rate 19, height 5\' 6"  (1.676 m), weight 101.8 kg, last menstrual period 10/22/2016, SpO2 97 %. Physical Exam  Patient is alert, oriented, no adenopathy, well-dressed, normal affect, normal respiratory effort. Examination patient has a good dorsalis pedis pulse she has good range of motion of the ankle and subtalar joint she has a floppy second and fourth toe that are flexed with pressure on the end of the toe from walking she cannot actively extend these toes.  She has a prominent second third and fourth metatarsal head.  There are  surgical incisions over the PIP joint of the second and fourth toe and it appears that she had a tendon release same procedure.  The toe flexors are active with flexion of the second and fourth toe.Heart RRR Lungs Clear Assessment/Plan 1. Claw toe, acquired, right     Plan: Discussed that she could proceed with conservative treatment with a wide sneaker versus surgical intervention.  Patient states she would like to proceed with surgical correction.  Would plan for a fusion of the PIP joint of the second and fourth toe and a Weil osteotomy for the second third and fourth metatarsal.  Risks and benefits were discussed postoperative care was discussed patient states she understands and wished to proceed at this time.   Bevely Palmer Nizar Cutler, PA 07/07/2020, 6:56 AM

## 2020-07-07 NOTE — Anesthesia Procedure Notes (Signed)
Anesthesia Regional Block: Popliteal block   Pre-Anesthetic Checklist: ,, timeout performed, Correct Patient, Correct Site, Correct Laterality, Correct Procedure, Correct Position, site marked, Risks and benefits discussed,  Surgical consent,  Pre-op evaluation,  At surgeon's request and post-op pain management  Laterality: Right  Prep: chloraprep       Needles:  Injection technique: Single-shot  Needle Type: Echogenic Needle     Needle Length: 10cm  Needle Gauge: 21     Additional Needles:   Narrative:  Start time: 07/07/2020 6:58 AM End time: 07/07/2020 7:01 AM Injection made incrementally with aspirations every 5 mL.  Performed by: Personally  Anesthesiologist: Audry Pili, MD  Additional Notes: No pain on injection. No increased resistance to injection. Injection made in 5cc increments. Good needle visualization. Patient tolerated the procedure well.

## 2020-07-07 NOTE — Telephone Encounter (Signed)
Pt is scheduled to go over mri results on Friday but said you guys already spoke over the phone yesterday. Does she still need her appt. Let me know so I can call her back please.

## 2020-07-07 NOTE — Op Note (Signed)
07/07/2020  8:30 AM  PATIENT:  Hannah [name]    PRE-OPERATIVE DIAGNOSIS:  Claw Toes Right Foot  POST-OPERATIVE DIAGNOSIS:  Same  PROCEDURE:  RIGHT FOOT WEIL OSTEOTOMY 2, 3, AND 4 METATARSALS AND PROXIMAL INTERPHALANGEAL JOINT FUSION 2 & 4 TOES  SURGEON:  Newt Minion, MD  PHYSICIAN ASSISTANT:None ANESTHESIA:   General  PREOPERATIVE INDICATIONS:  [name] is a  60 y.o. female with a diagnosis of Claw Toes Right Foot who failed conservative measures and elected for surgical management.    The risks benefits and alternatives were discussed with the patient preoperatively including but not limited to the risks of infection, bleeding, nerve injury, cardiopulmonary complications, the need for revision surgery, among others, and the patient was willing to proceed.  OPERATIVE IMPLANTS: 2 mm small frag screws 12, 12, 10 mm in length.  4.5 K wires x2  @ENCIMAGES @  OPERATIVE FINDINGS: Postoperative patient's toes were straight.  OPERATIVE PROCEDURE: Patient was brought the operating room after undergoing a regional anesthetic she then underwent a general anesthetic.  After adequate levels anesthesia were obtained patient's right lower extremity was prepped using DuraPrep draped into a sterile field a timeout was called.  An incision was made along the second ray from the PIP joint of the second toe proximal to the MTP joint.  The capsule to the MTP joint was incised retractors were placed a Weil osteotomy was performed the metatarsal head was translated proximally the over hanging bone was resected and the osteotomy was stabilized with a 2 x 12 mm screw.  The PIP joint was then resected the toe was straightened and this was stabilized with a 0.045 K wire.  Attention was then focused on the third metatarsal.  The capsule was incised the MTP joint retractors were placed a Weil osteotomy was performed the metatarsal head was translated proximally overhanging bone was resected and the osteotomy was  stabilized with a 2 x 12 mm mini frag screw.  A separate incision was then made over the fourth ray.  The capsule was incised the MTP joint retractors were placed a Weil osteotomy was performed metatarsal head was translated proximally overhanging bone was resected and this was stabilized with a 2 x 10 mm mini frag screw.  The PIP joint was then debrided as well straightened and stabilized with a 0.045 K wire.  The wounds were irrigated normal saline the incisions were closed using 2-0 nylon sterile dressing was applied patient was extubated taken the PACU in stable condition   DISCHARGE PLANNING:  Antibiotic duration: Preoperative antibiotics 2 g Kefzol  Weightbearing: Touchdown weightbearing on the right  Pain medication: Prescription for Percocet  Dressing care/ Wound VAC: Follow-up in the office 1 week to change the dressing  Ambulatory devices: Crutches or kneeling scooter  Discharge to: Home.  Follow-up: In the office 1 week post operative.

## 2020-07-07 NOTE — Telephone Encounter (Signed)
She can Cx. Thanks.

## 2020-07-07 NOTE — Anesthesia Postprocedure Evaluation (Signed)
Anesthesia Post Note  Patient: RILEY PAPIN  Procedure(s) Performed: RIGHT FOOT WEIL OSTEOTOMY 2, 3, AND 4 METATARSALS AND PROXIMAL INTERPHALANGEAL JOINT FUSION 2 & 4 TOES (Right Foot)     Patient location during evaluation: PACU Anesthesia Type: General Level of consciousness: awake and alert Pain management: pain level controlled Vital Signs Assessment: post-procedure vital signs reviewed and stable Respiratory status: spontaneous breathing, nonlabored ventilation and respiratory function stable Cardiovascular status: blood pressure returned to baseline and stable Postop Assessment: no apparent nausea or vomiting Anesthetic complications: no   No complications documented.  Last Vitals:  Vitals:   07/07/20 0900 07/07/20 1003  BP: (!) 150/92 (!) 146/90  Pulse: 70 70  Resp: (!) 8 18  Temp:  36.6 C  SpO2: 95% 96%    Last Pain:  Vitals:   07/07/20 1003  TempSrc: Oral  PainSc: 0-No pain                 Audry Pili

## 2020-07-07 NOTE — Progress Notes (Signed)
Assisted Dr. Fransisco Beau with right, ultrasound guided, popliteal block. Side rails up, monitors on throughout procedure. See vital signs in flow sheet. Tolerated Procedure well.

## 2020-07-07 NOTE — Telephone Encounter (Signed)
No she can cancel

## 2020-07-07 NOTE — Interval H&P Note (Signed)
History and Physical Interval Note:  07/07/2020 7:05 AM  Hannah [name]  has presented today for surgery, with the diagnosis of Claw Toes Right Foot.  The various methods of treatment have been discussed with the patient and family. After consideration of risks, benefits and other options for treatment, the patient has consented to  Procedure(s): RIGHT FOOT WEIL OSTEOTOMY 2, 3, AND 4 METATARSALS AND PROXIMAL INTERPHALANGEAL JOINT FUSION 2 & 4 TOES (Right) as a surgical intervention.  The patient's history has been reviewed, patient examined, no change in status, stable for surgery.  I have reviewed the patient's chart and labs.  Questions were answered to the patient's satisfaction.     Newt Minion

## 2020-07-08 ENCOUNTER — Ambulatory Visit (INDEPENDENT_AMBULATORY_CARE_PROVIDER_SITE_OTHER): Payer: BC Managed Care – PPO | Admitting: Physician Assistant

## 2020-07-08 ENCOUNTER — Other Ambulatory Visit: Payer: Self-pay

## 2020-07-08 ENCOUNTER — Telehealth: Payer: Self-pay | Admitting: Hematology

## 2020-07-08 ENCOUNTER — Encounter: Payer: Self-pay | Admitting: Physician Assistant

## 2020-07-08 DIAGNOSIS — M205X1 Other deformities of toe(s) (acquired), right foot: Secondary | ICD-10-CM

## 2020-07-08 NOTE — Progress Notes (Signed)
Office Visit Note   Patient: Hannah [name]           Date of Birth: 23-Aug-1960           MRN: [id number] Visit Date: 07/08/2020              Requested by: [name], MD [address] [place],  [place] [address] PCP: Osborne Casco Fransico Him, MD  Chief Complaint  Patient presents with  . Right Foot - Routine Post Op    07/07/20 weil osteotomies 2nd, 3rd and 4th MT      HPI: Patient is 1 day status post extensive forefoot surgery yesterday.  Including Weil osteotomy second third and fourth metatarsals PIP fusions.  She called last evening because she had bleeding through the dressing.  She was advised to reinforce the dressing and follow-up this morning.  She does not seem to have had any further bleeding.  Assessment & Plan: Visit Diagnoses: No diagnosis found.  Plan: Continue elevation new dry dressing was placed.  We will see her next week.  She asked how long she would be out of work.  She does work from home.  I told her that she will need to do quite a bit of elevating of her foot the first few weeks until the stitches are removed.  After that I think she could work from home provided she could elevate her foot as needed for swelling  Follow-Up Instructions: No follow-ups on file.   Ortho Exam  Patient is alert, oriented, no adenopathy, well-dressed, normal affect, normal respiratory effort. Right foot: Bloody dressing was removed.  She does have some minimal bloody drainage from the dorsal foot wounds.  No hematoma beneath the skin.  Overall swelling is well controlled.  Pins in the toes are intact and in place.  The toes are warm and have brisk capillary refill.  New dressing was applied.  No evidence of any infection.  Imaging: No results found. No images are attached to the encounter.  Labs: Lab Results  Component Value Date   ESRSEDRATE 53 (H) 09/05/2017   ESRSEDRATE 43 (H) 01/15/2016   CRP 0.6 01/15/2016   LABURIC 7.2 (H) 09/05/2017     Lab Results   Component Value Date   ALBUMIN 3.1 (L) 06/01/2020   ALBUMIN 3.4 (L) 03/19/2020   ALBUMIN 3.2 (L) 02/04/2020   LABURIC 7.2 (H) 09/05/2017    No results found for: MG No results found for: VD25OH  No results found for: PREALBUMIN CBC EXTENDED Latest Ref Rng & Units 06/01/2020 03/19/2020 02/04/2020  WBC 4.0 - 10.5 K/uL 7.3 10.5 9.7  RBC 3.87 - 5.11 MIL/uL 4.61 4.82 4.72  HGB 12.0 - 15.0 g/dL 10.7(L) 11.1(L) 11.2(L)  HCT 36.0 - 46.0 % 33.2(L) 34.5(L) 34.2(L)  PLT 150 - 400 K/uL 216 241 230  NEUTROABS 1.7 - 7.7 K/uL 4.6 5.6 6.8  LYMPHSABS 0.7 - 4.0 K/uL 2.2 4.1(H) 2.2     There is no height or weight on file to calculate BMI.  Orders:  No orders of the defined types were placed in this encounter.  No orders of the defined types were placed in this encounter.    Procedures: No procedures performed  Clinical Data: No additional findings.  ROS:  All other systems negative, except as noted in the HPI. Review of Systems  Objective: Vital Signs: LMP 10/22/2016 (Exact Date)   Specialty Comments:  No specialty comments available.  PMFS History: Patient Active Problem List   Diagnosis  Date Noted  . Claw toe, right   . Trigger finger, left middle finger 04/09/2020  . Stenosing tenosynovitis of finger of left hand 02/27/2020  . 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 Major depression, recurrent ([facility]) 01/12/2018  . Chronic right shoulder pain 12/20/2017  . 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 MDD (major depressive disorder), recurrent episode, moderate ([facility]) 11/04/2017  . MGUS (monoclonal gammopathy of unknown significance) 10/24/2017  . Primary osteoarthritis of both hands 09/26/2017  . Primary osteoarthritis of right shoulder 09/26/2017  . Primary osteoarthritis of both hips 09/26/2017  . Status post total knee replacement, right 09/26/2017  . Primary osteoarthritis of both feet 09/26/2017  . History of asthma 09/26/2017  . Primary osteoarthritis of left knee  04/18/2017  . Menorrhagia 11/04/2016  . Fibroid tumor 11/04/2016  . Adenomyosis 11/04/2016  . Hypertension, essential 11/04/2016  . 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 Allergy   . Anemia   . Anxiety   . Arthritis   . Asthma   . Depression   . Hypertension     Family History  Problem Relation Age of Onset  . Cancer Mother        uterine   . Asthma Mother   . Arthritis Daughter   . Depression Maternal Uncle   . Colon cancer Neg Hx     Past Surgical History:  Procedure Laterality Date  . CHOLECYSTECTOMY  1993  . COLONOSCOPY    . 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 TOTAL KNEE ARTHROPLASTY Right 01/28/2016   Procedure: RIGHT TOTAL KNEE ARTHROPLASTY;  Surgeon: [name], MD;  Location: [facility];  Service: Orthopedics;  Laterality: Right;  . TRIGGER FINGER RELEASE Right 06/18/2014   Procedure: RIGHT LONG FINGER TRIGGER RELEASE;  Surgeon: [name], MD;  Location: [facility];  Service: Orthopedics;  Laterality: Right;  . TUBAL LIGATION    . VAGINAL HYSTERECTOMY Bilateral 11/04/2016   Procedure: HYSTERECTOMY VAGINAL with Bilateral Salpingectomy;  Surgeon: [name], MD;  Location: [facility];  Service: Gynecology;  Laterality: Bilateral;   Social History   Occupational History  . Not on file  Tobacco Use  . Smoking status: Never Smoker  . Smokeless tobacco: Never Used  Vaping Use  . Vaping Use: Never used  Substance and Sexual Activity  . Alcohol use: No    Alcohol/week: 0.0 standard drinks  . Drug use: No  . Sexual activity: Yes    Partners: Male    Birth control/protection: Surgical

## 2020-07-08 NOTE — Progress Notes (Signed)
error 

## 2020-07-08 NOTE — Telephone Encounter (Signed)
Scheduled appt per 2/9 sch msg - pt is aware of appt date and time

## 2020-07-10 ENCOUNTER — Ambulatory Visit: Payer: BC Managed Care – PPO | Admitting: Orthopaedic Surgery

## 2020-07-10 NOTE — Progress Notes (Signed)
Hannah [name]   Telephone:([phone] Fax:([phone]) [phone]   Clinic Follow up Note   Patient Care Team: Tisovec, Fransico Him, MD as PCP - General (Internal Medicine) Bo Merino, MD as Consulting Physician (Rheumatology)  Date of Service:  07/13/2020  CHIEF COMPLAINT: Follow-up recent MRI findings   Oncology History Overview Note  Cancer Staging No matching staging information was found for the patient.    Multiple myeloma ([facility])  07/03/2020 Imaging   MRI Lumbar Spine  IMPRESSION: 1. Numerous T1 hypointense and STIR hyperintense lesions throughout the visualized spine and sacrum with multiple areas of extraosseous extension in the sacrum, detailed above and compatible with multiple myeloma given the clinical history. 2. An MRI of the pelvis with contrast could evaluate the full extent of the partially imaged sacral lesions, including left S1-S2 neural foraminal involvement and suspected involvement of the exited left L5 nerve. 3. Multilevel degenerative change without significant canal or foraminal stenosis in the lumbar spine.   07/13/2020 Initial Diagnosis   Multiple myeloma ([facility])   07/27/2020 -  Chemotherapy    Patient is on Treatment Plan: MYELOMA INDUCTION TRANSPLANT CANDIDATES VRD SQ          CURRENT THERAPY:  Surveillance and oral iron  INTERVAL HISTORY:  Hannah [name] is here for a follow up of MM. She presents to the clinic with her husband. She notes she had right foot surgery on 07/07/20 and still has pins in place. Due to her sciatic pain, her orthopedic surgeon Dr. Erlinda Hong ordered lumar MRI for her and it was done on 07/03/2020. Unfortunately it showed multiple bone lesions c/w MM. She is here to discuss next step. Her back pain is overall stable, she is not on any pain pains, it does gets worse when she walks for a while. No other new pains.   REVIEW OF SYSTEMS:   Constitutional: Denies fevers, chills or abnormal weight loss Eyes: Denies  blurriness of vision Ears, nose, mouth, throat, and face: Denies mucositis or sore throat Respiratory: Denies cough, dyspnea or wheezes Cardiovascular: Denies palpitation, chest discomfort or lower extremity swelling Gastrointestinal:  Denies nausea, heartburn or change in bowel habits Skin: Denies abnormal skin rashes MSK: (+) Boot on right foot and pins in toes.  Lymphatics: Denies new lymphadenopathy or easy bruising Neurological:Denies numbness, tingling or new weaknesses Behavioral/Psych: Mood is stable, no new changes  All other systems were reviewed with the patient and are negative.  MEDICAL HISTORY:  Past Medical History:  Diagnosis Date  . Allergy   . Anemia   . Anxiety   . Arthritis   . Asthma   . Depression   . Hypertension     SURGICAL HISTORY: Past Surgical History:  Procedure Laterality Date  . CHOLECYSTECTOMY  1993  . COLONOSCOPY    . 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 TOTAL KNEE ARTHROPLASTY Right 01/28/2016   Procedure: RIGHT TOTAL KNEE ARTHROPLASTY;  Surgeon: [name], MD;  Location: [facility];  Service: Orthopedics;  Laterality: Right;  . TRIGGER FINGER RELEASE Right 06/18/2014   Procedure: RIGHT LONG FINGER TRIGGER RELEASE;  Surgeon: [name], MD;  Location: [facility];  Service: Orthopedics;  Laterality: Right;  . TUBAL LIGATION    . VAGINAL HYSTERECTOMY Bilateral 11/04/2016   Procedure: HYSTERECTOMY VAGINAL with Bilateral Salpingectomy;  Surgeon: [name], MD;  Location: [facility];  Service: Gynecology;  Laterality: Bilateral;  . WEIL OSTEOTOMY Right 07/07/2020   Procedure: RIGHT FOOT WEIL OSTEOTOMY 2, 3,  AND 4 METATARSALS AND PROXIMAL INTERPHALANGEAL JOINT FUSION 2 & 4 TOES;  Surgeon: Newt Minion, MD;  Location: [facility];  Service: Orthopedics;  Laterality: Right;    I have reviewed the social history and family history with the patient and they are unchanged from previous note.  ALLERGIES:  is  allergic to elemental sulfur.  MEDICATIONS:  Current Outpatient Medications  Medication Sig Dispense Refill  . budesonide-formoterol (SYMBICORT) 160-4.5 MCG/ACT inhaler Inhale 2 puffs into the lungs daily as needed.    Marland Kitchen buPROPion (WELLBUTRIN XL) 150 MG 24 hr tablet 450 mg daily (150 mg + 300 mg) 30 tablet 0  . chlorthalidone (HYGROTON) 25 MG tablet TAKE 1 TABLET BY MOUTH EVERY DAY FOR HTN  3  . ferrous sulfate 325 (65 FE) MG tablet Take 325 mg by mouth 2 (two) times daily with a meal.    . ibuprofen (ADVIL,MOTRIN) 600 MG tablet 600 MG BY MOUTH EVERY 6 HOURS FOR 5 DAYS, THEN EVERY 6 HOURS AS NEEDED FOR PAIN.  DON'T TAKE MOBIC WHILE TAKING IBUPROFEN 60 tablet 0  . methocarbamol (ROBAXIN) 500 MG tablet Take 1 tablet (500 mg total) by mouth 2 (two) times daily as needed. 20 tablet 0  . oxyCODONE-acetaminophen (PERCOCET) 5-325 MG tablet Take 1 tablet by mouth every 4 (four) hours as needed for severe pain. 20 tablet 0  . sertraline (ZOLOFT) 50 MG tablet Take 1 tablet (50 mg total) by mouth at bedtime. 90 tablet 0  . traZODone (DESYREL) 50 MG tablet Take 1 tablet (50 mg total) by mouth at bedtime. 90 tablet 0  . valACYclovir (VALTREX) 500 MG tablet Take 500 mg by mouth 2 (two) times daily.    . Vitamin D, Ergocalciferol, (DRISDOL) 50000 units CAPS capsule Take 50,000 Units by mouth every Monday.      No current facility-administered medications for this visit.    PHYSICAL EXAMINATION: ECOG PERFORMANCE STATUS: 3 - Symptomatic, >50% confined to bed  Vitals:   07/13/20 1506  BP: 128/85  Pulse: (!) 110  Resp: 18  Temp: 98.2 F (36.8 C)  SpO2: 98%   Filed Weights   07/13/20 1506  Weight: 221 lb 12.8 oz (100.6 kg)   Due to COVID19 we will limit examination to appearance. Patient had no complaints.  GENERAL:alert, no distress and comfortable SKIN: skin color normal, no rashes or significant lesions EYES: normal, Conjunctiva are pink and non-injected, sclera clear  NEURO: alert & oriented  x 3 with fluent speech   LABORATORY DATA:  I have reviewed the data as listed CBC Latest Ref Rng & Units 07/13/2020 06/01/2020 03/19/2020  WBC 4.0 - 10.5 K/uL 8.5 7.3 10.5  Hemoglobin 12.0 - 15.0 g/dL 10.7(L) 10.7(L) 11.1(L)  Hematocrit 36.0 - 46.0 % 32.7(L) 33.2(L) 34.5(L)  Platelets 150 - 400 K/uL 206 216 241     CMP Latest Ref Rng & Units 07/13/2020 07/03/2020 06/01/2020  Glucose 70 - 99 mg/dL 98 84 104(H)  BUN 6 - 20 mg/dL 17 13 15   Creatinine 0.44 - 1.00 mg/dL 0.93 0.86 0.82  Sodium 135 - 145 mmol/L 140 139 142  Potassium 3.5 - 5.1 mmol/L 3.7 3.4(L) 3.5  Chloride 98 - 111 mmol/L 101 99 101  CO2 22 - 32 mmol/L 30 26 32  Calcium 8.9 - 10.3 mg/dL 9.8 9.4 9.5  Total Protein 6.5 - 8.1 g/dL 10.5(H) - 9.6(H)  Total Bilirubin 0.3 - 1.2 mg/dL 0.4 - 0.3  Alkaline Phos 38 - 126 U/L 66 - 74  AST 15 - 41 U/L 14(L) - 19  ALT 0 - 44 U/L 10 - 24      RADIOGRAPHIC STUDIES: I have personally reviewed the radiological images as listed and agreed with the findings in the report. No results found.   ASSESSMENT & PLAN:  Hannah [name] is a 60 y.o. female with    1. 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 I previouslydiscussed with pathologist Dr. Gari Crown, the increased plasma cells in bone marrow is likely between 10 to 20%, and would favorsmolderingmultiple myeloma than MGUS -Also patient does not meet CRAB for MM, she does havearthritis oflow back,hipand right shoulder. Initial pelvic and lumbar MRIin 2019 and 11/2019 bone survey was negativefor myeloma involvement.  -Her MM labs from 06/01/20 showed increase in IgA and and M-proteinto 3.3. Her light chain is also increasing with ratio 87.53, which is suspicious for MM. -Her recent lumbar MRI showed multiple T1 lesions in her lumbar spine and sacrum compatible with  MM. I reviewed with patient and her husband today.  -I discussed this is indicative of diagnosis of Multiple Myeloma. I recommend PET scan and bone marrow biopsy for staging, cytogenetic testing for risk stratification. She is agreeable.  -I discussed the prognosis of MM which is potentially curable and is very treatable. I discussed treatment options of Multiple Myeloma with induction chemotherapy, followed by bone morrow transplant (this part can be done at [facility], [facility], [facility]) and maintenance therapy.  -I discussed the induction chemo options, especially VRd (Velcade, Revlimid and dexamethasone) or Daratumumab, lenalidomide, dexamethasone (DRd). She is young and overall fit, would be a candidate for bone marrow transplant. We discussed potential side effects of the above agents and logistic of treatment, she is interested.  -I encouraged her to continue eating adequately with high protein foods. -She has received her COVID booster. I recommend she proceed with Shingles vaccine as soon as possible.  -plan to start chemo in 2-3 weeks after biopsy and PET scan   2 .Low back pain, right hip pain  -She initially attributes her pain to sciatica pain.  -During work up with orthopedic surgeon, she was found to have multiple T1 lesions of her spine and sacrum compatible with MM on her 07/03/20 MRI lumbar spine. -Her pain can reach 10/10, but improves with movement. She can use Ibuprofen and Tylenol for pain. If needed, I will call in stronger medication. I will discuss possible palliative RT with rad Onc.   3. 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  -Hemoglobin electrophoresisshowed increase in Hg C, consistent with Hg C trait. I discussed the results with her and informed her that her children are at risk ofhemoglobin C trait, and I encouraged him to be tested. -Anemia overall mild and stable. Continue oral ironBID.   4.  HTN, OA, Obesity -On chlorthalidonefor HTN  -She will continue to f/u with PCP, Chiropractor and Ortho.  5.Anxiety, depression -under care of [facility] provider, stable and tolerating Zoloft   PLAN: -Lab and PET scan in 1-2 weeks -Bone marrow biopsy in 1-2 weeks.  -chemo class and f/u after above work up, to start VRd after her visit    No problem-specific Assessment & Plan notes found for this encounter.   Orders Placed This Encounter  Procedures  . CT  Biopsy    Standing Status:   Future    Standing Expiration Date:   07/13/2021    Scheduling Instructions:     CT guided bone marrow biopsy    Order Specific Question:   Lab orders requested (DO NOT place separate lab orders, these will be automatically ordered during procedure specimen collection):    Answer:   Surgical Pathology    Order Specific Question:   Reason for Exam (SYMPTOM  OR DIAGNOSIS REQUIRED)    Answer:   newly diagnosed multiple myeloma    Order Specific Question:   Is patient pregnant?    Answer:   No    Order Specific Question:   Preferred location?    Answer:   [facility] PET Image Initial (PI) Skull Base To Thigh    Standing Status:   Future    Standing Expiration Date:   07/13/2021    Order Specific Question:   If indicated for the ordered procedure, I authorize the administration of a radiopharmaceutical per Radiology protocol    Answer:   Yes    Order Specific Question:   Is the patient pregnant?    Answer:   No    Order Specific Question:   Preferred imaging location?    Answer:   [facility]  . 24-Hr Ur UPEP/UIFE/Light Chains/TP    Standing Status:   Standing    Number of Occurrences:   5    Standing Expiration Date:   07/13/2021  . Lactate dehydrogenase (LDH)    Standing Status:   Future    Standing Expiration Date:   07/13/2021  . Beta 2 microglobulin    Standing Status:   Future    Standing Expiration Date:   07/13/2021  . Kappa/lambda light chains    Standing Status:    Standing    Number of Occurrences:   15    Standing Expiration Date:   07/13/2021  . Multiple Myeloma Panel (SPEP&IFE w/QIG)    Standing Status:   Standing    Number of Occurrences:   15    Standing Expiration Date:   07/13/2021   All questions were answered. The patient knows to call the clinic with any problems, questions or concerns. No barriers to learning was detected. The total time spent in the appointment was 40 minutes.     Truitt Merle, MD 07/13/2020   I, Joslyn Devon, am acting as scribe for Truitt Merle, MD.   I have reviewed the above documentation for accuracy and completeness, and I agree with the above.

## 2020-07-13 ENCOUNTER — Other Ambulatory Visit: Payer: Self-pay

## 2020-07-13 ENCOUNTER — Inpatient Hospital Stay: Payer: BC Managed Care – PPO

## 2020-07-13 ENCOUNTER — Inpatient Hospital Stay: Payer: BC Managed Care – PPO | Attending: Hematology | Admitting: Hematology

## 2020-07-13 ENCOUNTER — Encounter: Payer: Self-pay | Admitting: Hematology

## 2020-07-13 VITALS — BP 128/85 | HR 110 | Temp 98.2°F | Resp 18 | Ht 66.0 in | Wt 221.8 lb

## 2020-07-13 DIAGNOSIS — F419 Anxiety disorder, unspecified: Secondary | ICD-10-CM | POA: Diagnosis not present

## 2020-07-13 DIAGNOSIS — D472 Monoclonal gammopathy: Secondary | ICD-10-CM

## 2020-07-13 DIAGNOSIS — F32A Depression, unspecified: Secondary | ICD-10-CM | POA: Insufficient documentation

## 2020-07-13 DIAGNOSIS — C9000 Multiple myeloma not having achieved remission: Secondary | ICD-10-CM | POA: Diagnosis not present

## 2020-07-13 DIAGNOSIS — M1990 Unspecified osteoarthritis, unspecified site: Secondary | ICD-10-CM | POA: Insufficient documentation

## 2020-07-13 DIAGNOSIS — D509 Iron deficiency anemia, unspecified: Secondary | ICD-10-CM | POA: Insufficient documentation

## 2020-07-13 DIAGNOSIS — E669 Obesity, unspecified: Secondary | ICD-10-CM | POA: Insufficient documentation

## 2020-07-13 DIAGNOSIS — I10 Essential (primary) hypertension: Secondary | ICD-10-CM | POA: Insufficient documentation

## 2020-07-13 DIAGNOSIS — C9001 Multiple myeloma in remission: Secondary | ICD-10-CM | POA: Insufficient documentation

## 2020-07-13 LAB — CMP (CANCER CENTER ONLY)
ALT: 10 U/L (ref 0–44)
AST: 14 U/L — ABNORMAL LOW (ref 15–41)
Albumin: 3.2 g/dL — ABNORMAL LOW (ref 3.5–5.0)
Alkaline Phosphatase: 66 U/L (ref 38–126)
Anion gap: 9 (ref 5–15)
BUN: 17 mg/dL (ref 6–20)
CO2: 30 mmol/L (ref 22–32)
Calcium: 9.8 mg/dL (ref 8.9–10.3)
Chloride: 101 mmol/L (ref 98–111)
Creatinine: 0.93 mg/dL (ref 0.44–1.00)
GFR, Estimated: >60 mL/min (ref >60)
Glucose, Bld: 98 mg/dL (ref 70–99)
Potassium: 3.7 mmol/L (ref 3.5–5.1)
Sodium: 140 mmol/L (ref 135–145)
Total Bilirubin: 0.4 mg/dL (ref 0.3–1.2)
Total Protein: 10.5 g/dL — ABNORMAL HIGH (ref 6.5–8.1)

## 2020-07-13 LAB — CBC WITH DIFFERENTIAL (CANCER CENTER ONLY)
Abs Immature Granulocytes: 0.02 10*3/uL (ref 0.00–0.07)
Basophils Absolute: 0 10*3/uL (ref 0.0–0.1)
Basophils Relative: 0 %
Eosinophils Absolute: 0.1 10*3/uL (ref 0.0–0.5)
Eosinophils Relative: 1 %
HCT: 32.7 % — ABNORMAL LOW (ref 36.0–46.0)
Hemoglobin: 10.7 g/dL — ABNORMAL LOW (ref 12.0–15.0)
Immature Granulocytes: 0 %
Lymphocytes Relative: 24 %
Lymphs Abs: 2 10*3/uL (ref 0.7–4.0)
MCH: 23.5 pg — ABNORMAL LOW (ref 26.0–34.0)
MCHC: 32.7 g/dL (ref 30.0–36.0)
MCV: 71.9 fL — ABNORMAL LOW (ref 80.0–100.0)
Monocytes Absolute: 0.4 10*3/uL (ref 0.1–1.0)
Monocytes Relative: 4 %
Neutro Abs: 6 10*3/uL (ref 1.7–7.7)
Neutrophils Relative %: 71 %
Platelet Count: 206 10*3/uL (ref 150–400)
RBC: 4.55 MIL/uL (ref 3.87–5.11)
RDW: 16.6 % — ABNORMAL HIGH (ref 11.5–15.5)
WBC Count: 8.5 10*3/uL (ref 4.0–10.5)
nRBC: 0 % (ref 0.0–0.2)

## 2020-07-13 NOTE — Progress Notes (Signed)
START ON PATHWAY REGIMEN - Multiple Myeloma and Other Plasma Cell Dyscrasias     A cycle is every 21 days:     Bortezomib      Lenalidomide      Dexamethasone   **Always confirm dose/schedule in your pharmacy ordering system**  Patient Characteristics: Multiple Myeloma, Newly Diagnosed, Transplant Eligible, Unknown or Awaiting Test Results Disease Classification: Multiple Myeloma R-ISS Staging: III Therapeutic Status: Newly Diagnosed Is Patient Eligible for Transplant<= Transplant Eligible Risk Status: Unknown Intent of Therapy: Curative Intent, Discussed with Patient

## 2020-07-14 ENCOUNTER — Ambulatory Visit (INDEPENDENT_AMBULATORY_CARE_PROVIDER_SITE_OTHER): Payer: BC Managed Care – PPO | Admitting: Physician Assistant

## 2020-07-14 ENCOUNTER — Telehealth: Payer: Self-pay | Admitting: Hematology

## 2020-07-14 ENCOUNTER — Encounter: Payer: Self-pay | Admitting: Physician Assistant

## 2020-07-14 ENCOUNTER — Other Ambulatory Visit: Payer: Self-pay | Admitting: Hematology

## 2020-07-14 ENCOUNTER — Telehealth: Payer: Self-pay

## 2020-07-14 DIAGNOSIS — M205X1 Other deformities of toe(s) (acquired), right foot: Secondary | ICD-10-CM

## 2020-07-14 DIAGNOSIS — Z23 Encounter for immunization: Secondary | ICD-10-CM | POA: Diagnosis not present

## 2020-07-14 DIAGNOSIS — C9000 Multiple myeloma not having achieved remission: Secondary | ICD-10-CM

## 2020-07-14 NOTE — Progress Notes (Signed)
Office Visit Note   Patient: Hannah [name]           Date of Birth: March 22, 1961           MRN: [id number] Visit Date: 07/14/2020              Requested by: [name], MD [place],  [place] [id number] PCP: Osborne Casco Fransico Him, MD  No chief complaint on file.     HPI Patient is a pleasant 60 year old woman who is 1 week status post right foot Weil osteotomy on the second third fourth metatarsals with proximal fusion.  Of the second and fourth toes.  She did have some bleeding that required a dressing change the day after surgery but is doing much better now.  She is changing her dressing daily  Assessment & Plan: Visit Diagnoses: No diagnosis found.  Plan: May begin cleansing with Dial soap and water.  She will return in 1 week at which time x-rays of her foot should be obtained.  Could consider removing the pins and placing a pad that will allow continued alignment of her toes.  Follow-Up Instructions: No follow-ups on file.   Ortho Exam  Patient is alert, oriented, no adenopathy, well-dressed, normal affect, normal respiratory effort. Examination well apposed wound edges.  Sutures are in place.  She has no drainage.  Pins are intact and in place without any drainage from the pin sites.  Swelling is well controlled she has no redness or cellulitis.  Imaging: No results found. No images are attached to the encounter.  Labs: Lab Results  Component Value Date   ESRSEDRATE 53 (H) 09/05/2017   ESRSEDRATE 43 (H) 01/15/2016   CRP 0.6 01/15/2016   LABURIC 7.2 (H) 09/05/2017     Lab Results  Component Value Date   ALBUMIN 3.2 (L) 07/13/2020   ALBUMIN 3.1 (L) 06/01/2020   ALBUMIN 3.4 (L) 03/19/2020   LABURIC 7.2 (H) 09/05/2017    No results found for: MG No results found for: VD25OH  No results found for: PREALBUMIN CBC EXTENDED Latest Ref Rng & Units 07/13/2020 06/01/2020 03/19/2020  WBC 4.0 - 10.5 K/uL 8.5 7.3 10.5  RBC 3.87 - 5.11 MIL/uL 4.55 4.61  4.82  HGB 12.0 - 15.0 g/dL 10.7(L) 10.7(L) 11.1(L)  HCT 36.0 - 46.0 % 32.7(L) 33.2(L) 34.5(L)  PLT 150 - 400 K/uL 206 216 241  NEUTROABS 1.7 - 7.7 K/uL 6.0 4.6 5.6  LYMPHSABS 0.7 - 4.0 K/uL 2.0 2.2 4.1(H)     There is no height or weight on file to calculate BMI.  Orders:  No orders of the defined types were placed in this encounter.  No orders of the defined types were placed in this encounter.    Procedures: No procedures performed  Clinical Data: No additional findings.  ROS:  All other systems negative, except as noted in the HPI. Review of Systems  Objective: Vital Signs: LMP 10/22/2016 (Exact Date)   Specialty Comments:  No specialty comments available.  PMFS History: Patient Active Problem List   Diagnosis Date Noted  . Multiple myeloma ([place]) 07/13/2020  . Claw toe, right   . Trigger finger, left middle finger 04/09/2020  . Stenosing tenosynovitis of finger of left hand 02/27/2020  . 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 Major depression, recurrent ([place]) 01/12/2018  . Chronic right shoulder pain 12/20/2017  . 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 MDD (major depressive disorder), recurrent episode, moderate ([place]) 11/04/2017  . 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 Primary osteoarthritis of both hands 09/26/2017  . Primary osteoarthritis of right shoulder 09/26/2017  . Primary osteoarthritis of both hips 09/26/2017  . Status post total knee replacement, right 09/26/2017  . Primary osteoarthritis of both feet 09/26/2017  . History of asthma 09/26/2017  . Primary osteoarthritis of left knee 04/18/2017  . Menorrhagia 11/04/2016  . Fibroid tumor 11/04/2016  . Adenomyosis 11/04/2016  . Hypertension, essential 11/04/2016  . 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 Allergy   . Anemia   . Anxiety   . Arthritis   . Asthma   . Depression   . Hypertension     Family  History  Problem Relation Age of Onset  . Cancer Mother        uterine   . Asthma Mother   . Arthritis Daughter   . Depression Maternal Uncle   . Colon cancer Neg Hx     Past Surgical History:  Procedure Laterality Date  . CHOLECYSTECTOMY  1993  . COLONOSCOPY    . 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 TOTAL KNEE ARTHROPLASTY Right 01/28/2016   Procedure: RIGHT TOTAL KNEE ARTHROPLASTY;  Surgeon: [name], MD;  Location: [facility];  Service: Orthopedics;  Laterality: Right;  . TRIGGER FINGER RELEASE Right 06/18/2014   Procedure: RIGHT LONG FINGER TRIGGER RELEASE;  Surgeon: [name], MD;  Location: [facility];  Service: Orthopedics;  Laterality: Right;  . TUBAL LIGATION    . VAGINAL HYSTERECTOMY Bilateral 11/04/2016   Procedure: HYSTERECTOMY VAGINAL with Bilateral Salpingectomy;  Surgeon: [name], MD;  Location: [facility];  Service: Gynecology;  Laterality: Bilateral;  . WEIL OSTEOTOMY Right 07/07/2020   Procedure: RIGHT FOOT WEIL OSTEOTOMY 2, 3, AND 4 METATARSALS AND PROXIMAL INTERPHALANGEAL JOINT FUSION 2 & 4 TOES;  Surgeon: Newt Minion, MD;  Location: [facility];  Service: Orthopedics;  Laterality: Right;   Social History   Occupational History  . Not on file  Tobacco Use  . Smoking status: Never Smoker  . Smokeless tobacco: Never Used  Vaping Use  . Vaping Use: Never used  Substance and Sexual Activity  . Alcohol use: No    Alcohol/week: 0.0 standard drinks  . Drug use: No  . Sexual activity: Yes    Partners: Male    Birth control/protection: Surgical

## 2020-07-14 NOTE — Telephone Encounter (Signed)
I left message for Morey Hummingbird in image scheduling that Ms Franta needs scheduled for a ct guided bone marrow biopys in the next 1-2 weeks.

## 2020-07-14 NOTE — Telephone Encounter (Signed)
Left message with follow-up appointment and cancelled appointments per 2/14 los. Gave option to call back to reschedule if needed.

## 2020-07-15 ENCOUNTER — Encounter: Payer: Self-pay | Admitting: Hematology

## 2020-07-16 ENCOUNTER — Telehealth: Payer: Self-pay

## 2020-07-16 NOTE — Telephone Encounter (Signed)
After her lab appt on 2/21 she will call me and we will review revlimid paperwork.

## 2020-07-20 ENCOUNTER — Inpatient Hospital Stay: Payer: BC Managed Care – PPO

## 2020-07-20 ENCOUNTER — Other Ambulatory Visit: Payer: Self-pay

## 2020-07-20 ENCOUNTER — Telehealth: Payer: Self-pay | Admitting: Orthopaedic Surgery

## 2020-07-20 DIAGNOSIS — F32A Depression, unspecified: Secondary | ICD-10-CM | POA: Diagnosis not present

## 2020-07-20 DIAGNOSIS — M1990 Unspecified osteoarthritis, unspecified site: Secondary | ICD-10-CM | POA: Diagnosis not present

## 2020-07-20 DIAGNOSIS — D509 Iron deficiency anemia, unspecified: Secondary | ICD-10-CM | POA: Diagnosis not present

## 2020-07-20 DIAGNOSIS — E669 Obesity, unspecified: Secondary | ICD-10-CM | POA: Diagnosis not present

## 2020-07-20 DIAGNOSIS — I10 Essential (primary) hypertension: Secondary | ICD-10-CM | POA: Diagnosis not present

## 2020-07-20 DIAGNOSIS — F419 Anxiety disorder, unspecified: Secondary | ICD-10-CM | POA: Diagnosis not present

## 2020-07-20 DIAGNOSIS — C9000 Multiple myeloma not having achieved remission: Secondary | ICD-10-CM

## 2020-07-20 DIAGNOSIS — D472 Monoclonal gammopathy: Secondary | ICD-10-CM

## 2020-07-20 LAB — CBC WITH DIFFERENTIAL (CANCER CENTER ONLY)
Abs Immature Granulocytes: 0.02 10*3/uL (ref 0.00–0.07)
Basophils Absolute: 0 10*3/uL (ref 0.0–0.1)
Basophils Relative: 0 %
Eosinophils Absolute: 0.1 10*3/uL (ref 0.0–0.5)
Eosinophils Relative: 1 %
HCT: 28.3 % — ABNORMAL LOW (ref 36.0–46.0)
Hemoglobin: 9.2 g/dL — ABNORMAL LOW (ref 12.0–15.0)
Immature Granulocytes: 0 %
Lymphocytes Relative: 36 %
Lymphs Abs: 2.4 10*3/uL (ref 0.7–4.0)
MCH: 23.4 pg — ABNORMAL LOW (ref 26.0–34.0)
MCHC: 32.5 g/dL (ref 30.0–36.0)
MCV: 72 fL — ABNORMAL LOW (ref 80.0–100.0)
Monocytes Absolute: 0.3 10*3/uL (ref 0.1–1.0)
Monocytes Relative: 5 %
Neutro Abs: 3.8 10*3/uL (ref 1.7–7.7)
Neutrophils Relative %: 58 %
Platelet Count: 179 10*3/uL (ref 150–400)
RBC: 3.93 MIL/uL (ref 3.87–5.11)
RDW: 16.6 % — ABNORMAL HIGH (ref 11.5–15.5)
WBC Count: 6.6 10*3/uL (ref 4.0–10.5)
nRBC: 0 % (ref 0.0–0.2)

## 2020-07-20 LAB — CMP (CANCER CENTER ONLY)
ALT: 8 U/L (ref 0–44)
AST: 13 U/L — ABNORMAL LOW (ref 15–41)
Albumin: 3 g/dL — ABNORMAL LOW (ref 3.5–5.0)
Alkaline Phosphatase: 68 U/L (ref 38–126)
Anion gap: 14 (ref 5–15)
BUN: 12 mg/dL (ref 6–20)
CO2: 25 mmol/L (ref 22–32)
Calcium: 8.9 mg/dL (ref 8.9–10.3)
Chloride: 103 mmol/L (ref 98–111)
Creatinine: 0.83 mg/dL (ref 0.44–1.00)
GFR, Estimated: >60 mL/min (ref >60)
Glucose, Bld: 97 mg/dL (ref 70–99)
Potassium: 3.8 mmol/L (ref 3.5–5.1)
Sodium: 142 mmol/L (ref 135–145)
Total Bilirubin: 0.3 mg/dL (ref 0.3–1.2)
Total Protein: 9.7 g/dL — ABNORMAL HIGH (ref 6.5–8.1)

## 2020-07-20 LAB — LACTATE DEHYDROGENASE: LDH: 105 U/L (ref 98–192)

## 2020-07-20 NOTE — Telephone Encounter (Signed)
Please advise 

## 2020-07-20 NOTE — Telephone Encounter (Signed)
Patient called. She would like a refill on Robaxin called in. Her call back number is [phone]

## 2020-07-21 ENCOUNTER — Ambulatory Visit (INDEPENDENT_AMBULATORY_CARE_PROVIDER_SITE_OTHER): Payer: BC Managed Care – PPO | Admitting: Orthopedic Surgery

## 2020-07-21 ENCOUNTER — Other Ambulatory Visit: Payer: Self-pay | Admitting: Physician Assistant

## 2020-07-21 ENCOUNTER — Ambulatory Visit (INDEPENDENT_AMBULATORY_CARE_PROVIDER_SITE_OTHER): Payer: BC Managed Care – PPO

## 2020-07-21 ENCOUNTER — Telehealth: Payer: Self-pay

## 2020-07-21 DIAGNOSIS — M205X1 Other deformities of toe(s) (acquired), right foot: Secondary | ICD-10-CM | POA: Diagnosis not present

## 2020-07-21 LAB — MULTIPLE MYELOMA PANEL, SERUM
Albumin SerPl Elph-Mcnc: 3.6 g/dL (ref 2.9–4.4)
Albumin/Glob SerPl: 0.7 (ref 0.7–1.7)
Alpha 1: 0.2 g/dL (ref 0.0–0.4)
Alpha2 Glob SerPl Elph-Mcnc: 0.5 g/dL (ref 0.4–1.0)
B-Globulin SerPl Elph-Mcnc: 4.3 g/dL — ABNORMAL HIGH (ref 0.7–1.3)
Gamma Glob SerPl Elph-Mcnc: 0.8 g/dL (ref 0.4–1.8)
Globulin, Total: 5.9 g/dL — ABNORMAL HIGH (ref 2.2–3.9)
IgA: 5030 mg/dL — ABNORMAL HIGH (ref 87–352)
IgG (Immunoglobin G), Serum: 448 mg/dL — ABNORMAL LOW (ref 586–1602)
IgM (Immunoglobulin M), Srm: 11 mg/dL — ABNORMAL LOW (ref 26–217)
M Protein SerPl Elph-Mcnc: 3.3 g/dL — ABNORMAL HIGH
Total Protein ELP: 9.5 g/dL — ABNORMAL HIGH (ref 6.0–8.5)

## 2020-07-21 LAB — KAPPA/LAMBDA LIGHT CHAINS
Kappa free light chain: 720.2 mg/L — ABNORMAL HIGH (ref 3.3–19.4)
Kappa, lambda light chain ratio: 194.65 — ABNORMAL HIGH (ref 0.26–1.65)
Lambda free light chains: 3.7 mg/L — ABNORMAL LOW (ref 5.7–26.3)

## 2020-07-21 LAB — BETA 2 MICROGLOBULIN, SERUM: Beta-2 Microglobulin: 2.4 mg/L (ref 0.6–2.4)

## 2020-07-21 MED ORDER — METHOCARBAMOL 500 MG PO TABS: 500.0000 mg | ORAL_TABLET | Freq: Two times a day (BID) | ORAL | 0 refills | Status: DC | PRN

## 2020-07-21 NOTE — Telephone Encounter (Signed)
Sent in

## 2020-07-21 NOTE — Telephone Encounter (Signed)
I called patient and advised. 

## 2020-07-21 NOTE — Telephone Encounter (Signed)
Late entry. I meet with Ms Hannah [name] in person on 07/20/2020 at 0930.  I reviewed the Lenalidomide  REMS Patient-Physician Agreement Form item by item. A copy of the signed form was provided to the patient. All questions were answered.  Ms Hannah [name] verbalized understanding.  Form faxed to [facility] on 07/21/2020

## 2020-07-22 ENCOUNTER — Telehealth: Payer: Self-pay

## 2020-07-22 ENCOUNTER — Telehealth: Payer: Self-pay | Admitting: Pharmacist

## 2020-07-22 ENCOUNTER — Other Ambulatory Visit: Payer: Self-pay

## 2020-07-22 ENCOUNTER — Telehealth: Payer: Self-pay | Admitting: Hematology

## 2020-07-22 DIAGNOSIS — F419 Anxiety disorder, unspecified: Secondary | ICD-10-CM | POA: Diagnosis not present

## 2020-07-22 DIAGNOSIS — E669 Obesity, unspecified: Secondary | ICD-10-CM | POA: Diagnosis not present

## 2020-07-22 DIAGNOSIS — I10 Essential (primary) hypertension: Secondary | ICD-10-CM | POA: Diagnosis not present

## 2020-07-22 DIAGNOSIS — M1990 Unspecified osteoarthritis, unspecified site: Secondary | ICD-10-CM | POA: Diagnosis not present

## 2020-07-22 DIAGNOSIS — F32A Depression, unspecified: Secondary | ICD-10-CM | POA: Diagnosis not present

## 2020-07-22 DIAGNOSIS — C9000 Multiple myeloma not having achieved remission: Secondary | ICD-10-CM | POA: Diagnosis not present

## 2020-07-22 DIAGNOSIS — D509 Iron deficiency anemia, unspecified: Secondary | ICD-10-CM | POA: Diagnosis not present

## 2020-07-22 NOTE — Telephone Encounter (Signed)
Oral Oncology Patient Advocate Encounter  Received notification from [facility] that prior authorization for Revlimid is required.  PA submitted on CoverMyMeds Key [id number] Status is pending  Oral Oncology Clinic will continue to follow.  [place] Patient [facility] Phone [phone] Fax [phone] 07/22/2020 1:46 PM

## 2020-07-22 NOTE — Telephone Encounter (Signed)
Oral Oncology Patient Advocate Encounter  Prior Authorization for Revlimid has been approved.    PA# [id number] Effective dates: 07/22/20 through 07/22/21  Patient must use [facility]  Oral Oncology Clinic will continue to follow.    Hannah [name] Patient [facility] Phone [phone] Fax [phone] 07/22/2020 2:55 PM

## 2020-07-22 NOTE — Telephone Encounter (Signed)
Scheduled appt per 2/22 sch msg - left message for patient with appt date and time

## 2020-07-24 ENCOUNTER — Encounter (HOSPITAL_COMMUNITY)
Admission: RE | Admit: 2020-07-24 | Discharge: 2020-07-24 | Disposition: A | Discharge status: Home / Self Care | Payer: BC Managed Care – PPO | Source: Ambulatory Visit | Attending: Hematology | Admitting: Hematology

## 2020-07-24 ENCOUNTER — Other Ambulatory Visit: Payer: Self-pay

## 2020-07-24 DIAGNOSIS — C9000 Multiple myeloma not having achieved remission: Secondary | ICD-10-CM

## 2020-07-24 LAB — UPEP/UIFE/LIGHT CHAINS/TP, 24-HR UR
% BETA, Urine: 11.1 %
ALPHA 1 URINE: 0.3 %
Albumin, U: 3.2 %
Alpha 2, Urine: 3.1 %
Free Kappa Lt Chains,Ur: 3020.62 mg/L — ABNORMAL HIGH (ref 0.63–113.79)
Free Kappa/Lambda Ratio: 486.41 — ABNORMAL HIGH (ref 1.03–31.76)
Free Lambda Lt Chains,Ur: 6.21 mg/L (ref 0.47–11.77)
GAMMA GLOBULIN URINE: 82.2 %
M-SPIKE %, Urine: 77.1 % — ABNORMAL HIGH
M-Spike, Mg/24 Hr: 870 mg/24 hr — ABNORMAL HIGH
Total Protein, Urine-Ur/day: 1128 mg/24 hr — ABNORMAL HIGH (ref 30–150)
Total Protein, Urine: 86.8 mg/dL
Total Volume: 1300

## 2020-07-24 LAB — GLUCOSE, CAPILLARY: Glucose-Capillary: 85 mg/dL (ref 70–99)

## 2020-07-24 MED ORDER — FLUDEOXYGLUCOSE F - 18 (FDG) INJECTION
11.0700 | Freq: Once | INTRAVENOUS | Status: AC | PRN
  Administered 2020-07-24: 11.07 via INTRAVENOUS

## 2020-07-26 NOTE — Progress Notes (Unsigned)
Hannah [name]   Telephone:([phone] [phone]) [phone]   Clinic Follow up Note   Patient Care Team: Hannah [name], Hannah Him, MD as PCP - General (Internal Medicine) Hannah Merino, MD as Consulting Physician (Rheumatology) 07/27/2020  CHIEF COMPLAINT: Follow up Multiple Myeloma   SUMMARY OF ONCOLOGIC HISTORY: Oncology History Overview Note  Cancer Staging No matching staging information was found for the patient.    Multiple myeloma (Hannah [name])  07/03/2020 Imaging   MRI Lumbar Spine  IMPRESSION: 1. Numerous T1 hypointense and STIR hyperintense lesions throughout the visualized spine and sacrum with multiple areas of extraosseous extension in the sacrum, detailed above and compatible with multiple myeloma given the clinical history. 2. An MRI of the pelvis with contrast could evaluate the full extent of the partially imaged sacral lesions, including left S1-S2 neural foraminal involvement and suspected involvement of the exited left L5 nerve. 3. Multilevel degenerative change without significant canal or foraminal stenosis in the lumbar spine.   07/13/2020 Initial Diagnosis   Multiple myeloma (Hannah [name])   07/24/2020 PET scan   IMPRESSION: 1. Moderate to high hypermetabolic activity associated with the expansile soft tissue masses within the LEFT and RIGHT pelvic bones is consistent with active multiple myeloma / plasmacytoma. 2. Lytic lesion in the LEFT scapula with mild metabolic activity is indeterminate. 3. Metabolic activity associated with the RIGHT knee prosthetic and RIGHT distal foot fusion is favored post procedural inflammation.     07/27/2020 -  Chemotherapy    Patient is on Treatment Plan: MYELOMA INDUCTION TRANSPLANT CANDIDATES VRD SQ        CURRENT THERAPY: PENDING VRd, (Velcade, Revlimid, dex)  INTERVAL HISTORY: Ms. Hannah [name] returns for f/up, results, and treatment. She underwent PET scan 2/25; bone marrow scheduled on 07/30/20.  She is doing well but  overwhelmed with all the information.  She is having some mobility issues with sharp pain in the right pelvis when she walks, takes shower, or gets out of bed.  Tried Percocet but oxycodone just puts her to sleep.  Pain comes from a 10/10-8/10 with Tylenol/ibuprofen, only partially helpful. She is using a walker. Denies fall. She has numbness in the left foot last 3 toes.  Denies bladder or bowel dysfunction.  Able to eat and drink, denies N/V/C/D, no recent fever, chills, cough, chest pain, dyspnea or other concerns.  No recent dental surgery or concerns. She had the first of 2-shot series Shingrix injections on 2/15.  She is up-to-date on other vaccines.   MEDICAL HISTORY:  Past Medical History:  Diagnosis Date  . Allergy   . Anemia   . Anxiety   . Arthritis   . Asthma   . Depression   . Hypertension     SURGICAL HISTORY: Past Surgical History:  Procedure Laterality Date  . CHOLECYSTECTOMY  1993  . COLONOSCOPY    . 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 TOTAL KNEE ARTHROPLASTY Right 01/28/2016   Procedure: RIGHT TOTAL KNEE ARTHROPLASTY;  Surgeon: [name], MD;  Location: [facility];  Service: Orthopedics;  Laterality: Right;  . TRIGGER FINGER RELEASE Right 06/18/2014   Procedure: RIGHT LONG FINGER TRIGGER RELEASE;  Surgeon: [name], MD;  Location: [facility];  Service: Orthopedics;  Laterality: Right;  . TUBAL LIGATION    . VAGINAL HYSTERECTOMY Bilateral 11/04/2016   Procedure: HYSTERECTOMY VAGINAL with Bilateral Salpingectomy;  Surgeon: [name], MD;  Location: [facility];  Service: Gynecology;  Laterality: Bilateral;  . WEIL OSTEOTOMY Right 07/07/2020  Procedure: RIGHT FOOT WEIL OSTEOTOMY 2, 3, AND 4 METATARSALS AND PROXIMAL INTERPHALANGEAL JOINT FUSION 2 & 4 TOES;  Surgeon: Newt Minion, MD;  Location: [facility];  Service: Orthopedics;  Laterality: Right;    I have reviewed the social history and family history with the patient and  they are unchanged from previous note.  ALLERGIES:  is allergic to elemental sulfur.  MEDICATIONS:  Current Outpatient Medications  Medication Sig Dispense Refill  . dexamethasone (DECADRON) 4 MG tablet Take 1 tablet (4 mg total) by mouth daily. 30 tablet 0  . HYDROcodone-acetaminophen (NORCO) 5-325 MG tablet Take 1-2 tablets by mouth every 8 (eight) hours as needed for severe pain. 30 tablet 0  . budesonide-formoterol (SYMBICORT) 160-4.5 MCG/ACT inhaler Inhale 2 puffs into the lungs daily as needed.    Marland Kitchen buPROPion (WELLBUTRIN XL) 150 MG 24 hr tablet 450 mg daily (150 mg + 300 mg) 30 tablet 0  . chlorthalidone (HYGROTON) 25 MG tablet TAKE 1 TABLET BY MOUTH EVERY DAY FOR HTN  3  . ferrous sulfate 325 (65 FE) MG tablet Take 325 mg by mouth 2 (two) times daily with a meal.    . ibuprofen (ADVIL,MOTRIN) 600 MG tablet 600 MG BY MOUTH EVERY 6 HOURS FOR 5 DAYS, THEN EVERY 6 HOURS AS NEEDED FOR PAIN.  DON'T TAKE MOBIC WHILE TAKING IBUPROFEN 60 tablet 0  . methocarbamol (ROBAXIN) 500 MG tablet Take 1 tablet (500 mg total) by mouth 2 (two) times daily as needed. 20 tablet 0  . sertraline (ZOLOFT) 50 MG tablet Take 1 tablet (50 mg total) by mouth at bedtime. 90 tablet 0  . traZODone (DESYREL) 50 MG tablet Take 1 tablet (50 mg total) by mouth at bedtime. 90 tablet 0  . valACYclovir (VALTREX) 500 MG tablet Take 500 mg by mouth 2 (two) times daily.    . Vitamin D, Ergocalciferol, (DRISDOL) 50000 units CAPS capsule Take 50,000 Units by mouth every Monday.      No current facility-administered medications for this visit.    PHYSICAL EXAMINATION: ECOG PERFORMANCE STATUS: 1 - Symptomatic but completely ambulatory  Vitals:   07/27/20 0918  BP: (!) 149/100  Pulse: 90  Resp: 16  Temp: 97.7 F (36.5 C)  SpO2: 99%   Filed Weights   07/27/20 0918  Weight: 228 lb 3.2 oz (103.5 kg)    GENERAL:alert, no distress and comfortable SKIN: No rash EYES: sclera clear LUNGS: clear with normal breathing  effort HEART: regular rate & rhythm, no lower extremity edema Musculoskeletal: Right foot in surgical boot.  No focal tenderness NEURO: alert & oriented x 3 with fluent speech, no focal sensory deficits  LABORATORY DATA:  I have reviewed the data as listed CBC Latest Ref Rng & Units 07/20/2020 07/13/2020 06/01/2020  WBC 4.0 - 10.5 K/uL 6.6 8.5 7.3  Hemoglobin 12.0 - 15.0 g/dL 9.2(L) 10.7(L) 10.7(L)  Hematocrit 36.0 - 46.0 % 28.3(L) 32.7(L) 33.2(L)  Platelets 150 - 400 K/uL 179 206 216     CMP Latest Ref Rng & Units 07/20/2020 07/13/2020 07/03/2020  Glucose 70 - 99 mg/dL 97 98 84  BUN 6 - 20 mg/dL 12 17 13   Creatinine 0.44 - 1.00 mg/dL 0.83 0.93 0.86  Sodium 135 - 145 mmol/L 142 140 139  Potassium 3.5 - 5.1 mmol/L 3.8 3.7 3.4(L)  Chloride 98 - 111 mmol/L 103 101 99  CO2 22 - 32 mmol/L 25 30 26   Calcium 8.9 - 10.3 mg/dL 8.9 9.8 9.4  Total Protein  6.5 - 8.1 g/dL 9.7(H) 10.5(H) -  Total Bilirubin 0.3 - 1.2 mg/dL 0.3 0.4 -  Alkaline Phos 38 - 126 U/L 68 66 -  AST 15 - 41 U/L 13(L) 14(L) -  ALT 0 - 44 U/L 8 10 -      RADIOGRAPHIC STUDIES: I have personally reviewed the radiological images as listed and agreed with the findings in the report. No results found.   ASSESSMENT & PLAN: Hannah [name] is a 60 y.o. female with    1. 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 Initial pelvic and lumbar MRIin 2019 and 11/2019 bone survey was negativefor myeloma involvement.  -Her MM labs from 06/01/20 showed increase in IgA and and M-proteinto 3.3. Her light chain is also increasing with ratio 87.53, which  is suspicious for MM. -Her recent lumbar MRI showed multiple T1 lesions in her lumbar spine and sacrum compatible with MM. -even without biopsy this is indicative of diagnosis of Multiple Myeloma.  -We reviewed her staging PET scan from 07/24/2020 which showed large hypermetabolic activity associated with the expansile soft tissue mass within the left and right pelvic bones, consistent with active MM/plasmacytoma, and a lytic lesion in the left scapula with mild activity.  Bone marrow biopsy for staging and cytogenetics/risk stratification is scheduled on 3/3 -Again discussed the typical nature and course of multiple myeloma and the proposed VRD treatment regimen the Dr. Burr Medico has recommended.  Chemo class today. -Due to her mobility issues and pain, we are referring her for urgent RT consult and palliative radiation to the pelvis and PT to improve her mobility.  We discussed pain management.  We will go ahead and start her on low-dose Dex 4 mg daily for inflammatory pain and try norco, she did not like percocet (increased drowsiness) -Due to her bone lesions she is a candidate for Zometa, we reviewed potential toxicities and benefit.  She agrees to proceed. -She will proceed with bone marrow biopsy on 3/3, follow-up on 3/7, and start Zometa.  We will hold on Revlimid and Velcade until she has completed her course of palliative radiation. -she is s/p first shingrix vaccine 07/14/20 (second inj in 2-6 months). She will start prophylactic dose of valtrex.  -patient seen with Dr. Burr Medico  2. Low back pain, right hip pain  -She initially attributes her pain to sciatica pain.  -work up per ortho showed multiple T1 lesions of her spine and sacrum compatible with MM on her 07/03/20 MRI lumbar spine. -PET 07/24/20 showed large hypermetabolic lesions in the left and right pelvis consistent with MM/plasmacytoma and a faintly metabolic lesion in the left scapula which is not currently causing significant pain. -Given  the PET findings and that she is symptomatic, with moderate to severe pain limiting mobility, we are referring her for urgent Rad Onc consult for palliative radiation -I prescribed Norco 5-325 mg 1-2 tabs every 8 as needed for severe pain  3. 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  -Hemoglobin electrophoresisshowed increase in Hg C, consistent with Hg C trait.  She is aware her children are at risk ofhemoglobin  C trait, they have been encouraged to be tested. -Anemia worsened recently with MM, Hg 9.2  -Continue oral ironBID.  4. HTN, OA, Obesity -On chlorthalidonefor HTN  -She will continue to f/u with PCP, Chiropractor and Ortho.  5.Anxiety, depression -under care of [facility] provider, toleratingZoloft -She is overwhelmed with the diagnosis and information but otherwise stable  PLAN: -Labs, PET reviewed -Urgent rad onc and PT referrals  -Rx: Norco 5-325 mg 1-2 tabs q8 PRN, begin dex 4 mg po once daily, and acyclovir  -Bone marrow biopsy 07/30/20 -F/up 3/7 and start zometa -plan to hold Revlimid and Velcade until she completes palliative RT    Orders Placed This Encounter  Procedures  . Ambulatory referral to Radiation Oncology    Referral Priority:   Urgent    Referral Type:   Consultation    Referral Reason:   Specialty Services Required    Requested Specialty:   Radiation Oncology    Number of Visits Requested:   1  . Ambulatory referral to Physical Therapy    Referral Priority:   Urgent    Referral Type:   Physical Medicine    Referral Reason:   Specialty Services Required    Requested Specialty:   Physical Therapy    Number of Visits Requested:   1   All questions were answered. The patient knows to call the clinic with any problems, questions or concerns. No barriers to learning were detected.     Alla Feeling, NP 07/27/20   Addendum  I have seen the patient, examined her. I  agree with the assessment and and plan and have edited the notes.   Pt has worsening severe low back pain. I personally reviewed her PET scan images, which showed large hypermetabolic soft tissue masses within the left and the right pelvic bone, close to sacrum, which is consistent with multiple myeloma/plasmacytoma  And likely the cause of her back pain.  We discussed the pain management, will start her on narcotics and dexamethasone 4 mg daily.  I recommend palliative radiation, will make a urgent referral to Dr. Lisbeth Renshaw today.  We will also set up home physical therapy.  She is scheduled for bone marrow biopsy later this week, will check cytogenetics and FISH, for her MM risk stratification and staging. We discussed that she meets diagnostic criteria for multiple myeloma based on her lab results and PET scan, and I recommend induction chemotherapy with Velcade, Revlimid and dexamethasone (VRD).  A candidate for pulmonary transplant, will refer her to [facility] in the near future.  Plan to start chemo right after she completes radiation. Will see her back next week for follow up. Plan to start zometa next week.   I spent a total of 22mns for her visit today, including care coordination.   YTruitt Merle 07/27/2020

## 2020-07-27 ENCOUNTER — Inpatient Hospital Stay: Payer: BC Managed Care – PPO

## 2020-07-27 ENCOUNTER — Other Ambulatory Visit: Payer: Self-pay

## 2020-07-27 ENCOUNTER — Encounter: Payer: Self-pay | Admitting: Nurse Practitioner

## 2020-07-27 ENCOUNTER — Inpatient Hospital Stay (HOSPITAL_BASED_OUTPATIENT_CLINIC_OR_DEPARTMENT_OTHER): Payer: BC Managed Care – PPO | Admitting: Nurse Practitioner

## 2020-07-27 VITALS — BP 149/100 | HR 90 | Temp 97.7°F | Resp 16 | Ht 66.0 in | Wt 228.2 lb

## 2020-07-27 DIAGNOSIS — F32A Depression, unspecified: Secondary | ICD-10-CM | POA: Diagnosis not present

## 2020-07-27 DIAGNOSIS — F419 Anxiety disorder, unspecified: Secondary | ICD-10-CM | POA: Diagnosis not present

## 2020-07-27 DIAGNOSIS — I10 Essential (primary) hypertension: Secondary | ICD-10-CM | POA: Diagnosis not present

## 2020-07-27 DIAGNOSIS — E669 Obesity, unspecified: Secondary | ICD-10-CM | POA: Diagnosis not present

## 2020-07-27 DIAGNOSIS — C9000 Multiple myeloma not having achieved remission: Secondary | ICD-10-CM | POA: Diagnosis not present

## 2020-07-27 DIAGNOSIS — D509 Iron deficiency anemia, unspecified: Secondary | ICD-10-CM | POA: Diagnosis not present

## 2020-07-27 DIAGNOSIS — M1990 Unspecified osteoarthritis, unspecified site: Secondary | ICD-10-CM | POA: Diagnosis not present

## 2020-07-27 MED ORDER — DEXAMETHASONE 4 MG PO TABS: 4.0000 mg | ORAL_TABLET | Freq: Every day | ORAL | 0 refills | Status: DC

## 2020-07-27 MED ORDER — HYDROCODONE-ACETAMINOPHEN 5-325 MG PO TABS: 1.0000 | ORAL_TABLET | Freq: Three times a day (TID) | ORAL | 0 refills | Status: DC | PRN

## 2020-07-27 NOTE — Progress Notes (Signed)
Histology and Location of Primary Cancer: Multiple Myeloma- Bone mets  Sites of Visceral and Bony Metastatic Disease: T spine, pelvis, left shoulder  Patient recently had surgery on her right foot.  She was also experiencing sciatic pain.  She was seen by her orthopedic surgeon who recommended lumbar MRI.  PET 07/24/2020: Dominant finding is 2 large soft tissue expansile lesion centered within the pelvis. Lesion centered in the LEFT sacral ala measures 5.6 by 5.0 cm with SUV max equal 6.4. Similar expansile lesion centered in the posterior aspect of the RIGHT iliac bone measures 6.4 x 4.6 cm SUV max equal 6.1.  More subtle lesion in the LEFT scapula along the glenoid fossa with SUV max equal 3.2. There is a lytic lesion measuring 1.2 cm on image 68.  MRI L Spine 07/03/2020:Numerous T1 hypointense and STIR hyperintense lesions throughout the visualized spine and sacrum with multiple areas of  extraosseous extension in the sacrum, detailed above and compatible with multiple myeloma given the clinical history.  An MRI of the pelvis with contrast could evaluate the full extent of the partially imaged sacral lesions, including left S1-S2 neural foraminal involvement and suspected involvement of the exited left L5 nerve.  Past/Anticipated chemotherapy by medical oncology, if any:  Dr. Burr Medico 07/13/2020 -Her recent lumbar MRI showed multiple T1 lesions in her lumbar spine and sacrum compatible with MM. I reviewed with patient and her husband today.  -I discussed this is indicative of diagnosis of Multiple Myeloma. I recommend PET scan and bone marrow biopsy for staging, cytogenetic testing for risk stratification. She is agreeable.  -I discussed the prognosis of MM which is potentially curable and is very treatable. I discussed treatment options of Multiple Myeloma with induction chemotherapy, followed by bone morrow transplant (this part [facility] be done at [facility], [facility], [facility]) and maintenance therapy.  -plan to  start chemo in 2-3 weeks after biopsy and PET scan   Pain on a scale of 0-10 is:    Ambulatory status? Walker? Wheelchair?: Ambulatory.  She continues to have some pain in her pelvic region when she walks for prolonged time.  SAFETY ISSUES:  Prior radiation? No  Pacemaker/ICD? No  Possible current pregnancy? Hysterectomy  Is the patient on methotrexate? no  Current Complaints / other details:

## 2020-07-28 ENCOUNTER — Encounter: Payer: Self-pay | Admitting: Radiation Oncology

## 2020-07-28 ENCOUNTER — Encounter: Payer: Self-pay | Admitting: General Practice

## 2020-07-28 ENCOUNTER — Encounter: Payer: Self-pay | Admitting: Nurse Practitioner

## 2020-07-28 ENCOUNTER — Telehealth: Payer: Self-pay | Admitting: Nurse Practitioner

## 2020-07-28 ENCOUNTER — Ambulatory Visit
Admission: RE | Admit: 2020-07-28 | Discharge: 2020-07-28 | Disposition: A | Discharge status: Home / Self Care | Payer: BC Managed Care – PPO | Source: Ambulatory Visit | Attending: Radiation Oncology | Admitting: Radiation Oncology

## 2020-07-28 ENCOUNTER — Encounter: Payer: Self-pay | Admitting: Orthopedic Surgery

## 2020-07-28 ENCOUNTER — Other Ambulatory Visit: Payer: Self-pay

## 2020-07-28 VITALS — Ht 66.0 in | Wt 228.0 lb

## 2020-07-28 DIAGNOSIS — C9000 Multiple myeloma not having achieved remission: Secondary | ICD-10-CM

## 2020-07-28 DIAGNOSIS — C7951 Secondary malignant neoplasm of bone: Secondary | ICD-10-CM

## 2020-07-28 DIAGNOSIS — D472 Monoclonal gammopathy: Secondary | ICD-10-CM

## 2020-07-28 NOTE — Progress Notes (Signed)
[place] Psychosocial Distress Screening Clinical Social Work  Clinical Social Work was referred by distress screening protocol.  The patient scored a 8 on the Psychosocial Distress Thermometer which indicates moderate distress. Clinical Social Worker contacted patient by phone to assess for distress and other psychosocial needs. Aware that she will start radiation first, then infusion chemotherapy.  Is currently out of work due to foot surgery. Wants to remain positive and hopeful for a "good outcome."  She is "new" to the diagnosis and treatment, she and her husband are processing this together.  She has support from her adult children who are able to help w transport and other issues as needed. She is out of work but does not have financial stress at this time.  CSW and patient discussed common feeling and emotions when being diagnosed with cancer, and the importance of support during treatment. CSW informed patient of the support team and support services at [facility]. CSW provided contact information and encouraged patient to call with any questions or concerns.  ONCBCN DISTRESS SCREENING 07/27/2020  Screening Type Initial Screening  Distress experienced in past week (1-10) 8  Emotional problem type Adjusting to illness  Physical Problem type Pain;Getting around;Bathing/dressing  Physician notified of physical symptoms Yes  Other Contact via phone per patient's request    Clinical Social Worker follow up needed: No.  If yes, follow up plan:  Beverely Pace, [place], LCSW Clinical Social Worker Phone:  [phone]

## 2020-07-28 NOTE — Progress Notes (Signed)
Office Visit Note   Patient: Hannah [name]           Date of Birth: [birth year]           MRN: [id number] Visit Date: 07/21/2020              Requested by: [name], MD [address] [place],  [place] [address] PCP: Osborne Casco Fransico Him, MD  Chief Complaint  Patient presents with  . Right Foot - Routine Post Op      HPI: Patient is a 60 year old woman who presents 2 weeks status post right foot Weil osteotomy second third and fourth metatarsals with PIP fusion of the second and fourth toes.  Assessment & Plan: Visit Diagnoses:  1. Claw toe, right     Plan: Patient will continue protected weightbearing a mouse pad was applied to keep the toes straight.  Pins were removed sutures are harvested.  Follow-Up Instructions: No follow-ups on file.   Ortho Exam  Patient is alert, oriented, no adenopathy, well-dressed, normal affect, normal respiratory effort. Examination the incisions are healing well no redness no cellulitis no drainage the sutures are removed the pins are removed a mouse pad is applied to keep the toe straight.  Imaging: No results found. No images are attached to the encounter.  Labs: Lab Results  Component Value Date   ESRSEDRATE 53 (H) 09/05/2017   ESRSEDRATE 43 (H) 01/15/2016   CRP 0.6 01/15/2016   LABURIC 7.2 (H) 09/05/2017     Lab Results  Component Value Date   ALBUMIN 3.0 (L) 07/20/2020   ALBUMIN 3.2 (L) 07/13/2020   ALBUMIN 3.1 (L) 06/01/2020   LABURIC 7.2 (H) 09/05/2017    No results found for: MG No results found for: VD25OH  No results found for: PREALBUMIN CBC EXTENDED Latest Ref Rng & Units 07/20/2020 07/13/2020 06/01/2020  WBC 4.0 - 10.5 K/uL 6.6 8.5 7.3  RBC 3.87 - 5.11 MIL/uL 3.93 4.55 4.61  HGB 12.0 - 15.0 g/dL 9.2(L) 10.7(L) 10.7(L)  HCT 36.0 - 46.0 % 28.3(L) 32.7(L) 33.2(L)  PLT 150 - 400 K/uL 179 206 216  NEUTROABS 1.7 - 7.7 K/uL 3.8 6.0 4.6  LYMPHSABS 0.7 - 4.0 K/uL 2.4 2.0 2.2     There is no height or  weight on file to calculate BMI.  Orders:  Orders Placed This Encounter  Procedures  . XR Foot 2 Views Right   No orders of the defined types were placed in this encounter.    Procedures: No procedures performed  Clinical Data: No additional findings.  ROS:  All other systems negative, except as noted in the HPI. Review of Systems  Objective: Vital Signs: LMP 10/22/2016 (Exact Date)   Specialty Comments:  No specialty comments available.  PMFS History: Patient Active Problem List   Diagnosis Date Noted  . Multiple myeloma ([place]) 07/13/2020  . Claw toe, right   . Trigger finger, left middle finger 04/09/2020  . Stenosing tenosynovitis of finger of left hand 02/27/2020  . 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 Major depression, recurrent ([place]) 01/12/2018  . Chronic right shoulder pain 12/20/2017  . 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 MDD (major depressive disorder), recurrent episode, moderate ([place]) 11/04/2017  . MGUS (monoclonal gammopathy of unknown significance) 10/24/2017  . Primary osteoarthritis of both hands 09/26/2017  . Primary osteoarthritis of right shoulder 09/26/2017  . Primary osteoarthritis of both hips 09/26/2017  . Status post total knee replacement, right 09/26/2017  . Primary osteoarthritis of  both feet 09/26/2017  . History of asthma 09/26/2017  . Primary osteoarthritis of left knee 04/18/2017  . Menorrhagia 11/04/2016  . Fibroid tumor 11/04/2016  . Adenomyosis 11/04/2016  . Hypertension, essential 11/04/2016  . 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 Allergy   . Anemia   . Anxiety   . Arthritis   . Asthma   . Depression   . Hypertension     Family History  Problem Relation Age of Onset  . Cancer Mother        uterine   . Asthma Mother   . Arthritis Daughter   . Depression Maternal Uncle   . Colon cancer Neg Hx     Past Surgical History:  Procedure Laterality Date  .  CHOLECYSTECTOMY  1993  . COLONOSCOPY    . 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 TOTAL KNEE ARTHROPLASTY Right 01/28/2016   Procedure: RIGHT TOTAL KNEE ARTHROPLASTY;  Surgeon: [name], MD;  Location: [facility];  Service: Orthopedics;  Laterality: Right;  . TRIGGER FINGER RELEASE Right 06/18/2014   Procedure: RIGHT LONG FINGER TRIGGER RELEASE;  Surgeon: [name], MD;  Location: [facility];  Service: Orthopedics;  Laterality: Right;  . TUBAL LIGATION    . VAGINAL HYSTERECTOMY Bilateral 11/04/2016   Procedure: HYSTERECTOMY VAGINAL with Bilateral Salpingectomy;  Surgeon: [name], MD;  Location: [facility];  Service: Gynecology;  Laterality: Bilateral;  . WEIL OSTEOTOMY Right 07/07/2020   Procedure: RIGHT FOOT WEIL OSTEOTOMY 2, 3, AND 4 METATARSALS AND PROXIMAL INTERPHALANGEAL JOINT FUSION 2 & 4 TOES;  Surgeon: Newt Minion, MD;  Location: [facility];  Service: Orthopedics;  Laterality: Right;   Social History   Occupational History  . Not on file  Tobacco Use  . Smoking status: Never Smoker  . Smokeless tobacco: Never Used  Vaping Use  . Vaping Use: Never used  Substance and Sexual Activity  . Alcohol use: No    Alcohol/week: 0.0 standard drinks  . Drug use: No  . Sexual activity: Yes    Partners: Male    Birth control/protection: Surgical

## 2020-07-28 NOTE — Telephone Encounter (Signed)
Checked out appointment. No LOS notes needing to be scheduled. No changes made. 

## 2020-07-28 NOTE — Progress Notes (Signed)
Radiation Oncology         [phone] ________________________________  Name: Hannah [name]        MRN: [id number]  Date of Service: 07/28/2020 DOB: March 04, 1961  WU:[id number], Fransico Him, MD  Truitt Merle, MD     REFERRING PHYSICIAN: Truitt Merle, MD   DIAGNOSIS: The encounter diagnosis was Smoldering multiple myeloma ([place]).   HISTORY OF PRESENT ILLNESS: Hannah [name] is a 60 y.o. female seen at the request of Dr. Burr Medico for a history of multiple myeloma. She's been followed since a diagnosis in 2019 and recent blood work and imaging has led to the plan to begin systemic therapy. She had a recent foot surgery in the right foot due to arthritic issues, but she had a PET scan on 07/24/20 that showed hypermetabolism in the left and right pelvis and also left scapula almost in the level of the glenohumeral joint space with a small tumor in the left scapula. There was right lower extremity enhancement felt to be related to post procedural changes.     PREVIOUS RADIATION THERAPY: No   PAST MEDICAL HISTORY:  Past Medical History:  Diagnosis Date  . Allergy   . Anemia   . Anxiety   . Arthritis   . Asthma   . Depression   . Hypertension        PAST SURGICAL HISTORY: Past Surgical History:  Procedure Laterality Date  . CHOLECYSTECTOMY  1993  . COLONOSCOPY    . 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 TOTAL KNEE ARTHROPLASTY Right 01/28/2016   Procedure: RIGHT TOTAL KNEE ARTHROPLASTY;  Surgeon: [name], MD;  Location: [facility];  Service: Orthopedics;  Laterality: Right;  . TRIGGER FINGER RELEASE Right 06/18/2014   Procedure: RIGHT LONG FINGER TRIGGER RELEASE;  Surgeon: [name], MD;  Location: [facility];  Service: Orthopedics;  Laterality: Right;  . TUBAL LIGATION    . VAGINAL HYSTERECTOMY Bilateral 11/04/2016   Procedure: HYSTERECTOMY VAGINAL with Bilateral Salpingectomy;  Surgeon: [name], MD;  Location: [facility];  Service: Gynecology;  Laterality:  Bilateral;  . WEIL OSTEOTOMY Right 07/07/2020   Procedure: RIGHT FOOT WEIL OSTEOTOMY 2, 3, AND 4 METATARSALS AND PROXIMAL INTERPHALANGEAL JOINT FUSION 2 & 4 TOES;  Surgeon: Newt Minion, MD;  Location: [facility];  Service: Orthopedics;  Laterality: Right;     FAMILY HISTORY:  Family History  Problem Relation Age of Onset  . Cancer Mother        uterine   . Asthma Mother   . Arthritis Daughter   . Depression Maternal Uncle   . Colon cancer Neg Hx      SOCIAL HISTORY:  reports that she has never smoked. She has never used smokeless tobacco. She reports that she does not drink alcohol and does not use drugs.  The patient is married and lives in [place]. She works remotely for [employer], and works part time at [employer] and [employer] as well.   ALLERGIES: Elemental sulfur   MEDICATIONS:  Current Outpatient Medications  Medication Sig Dispense Refill  . albuterol (VENTOLIN HFA) 108 (90 Base) MCG/ACT inhaler Inhale into the lungs.    . budesonide-formoterol (SYMBICORT) 160-4.5 MCG/ACT inhaler Inhale 2 puffs into the lungs daily as needed.    . chlorthalidone (HYGROTON) 25 MG tablet TAKE 1 TABLET BY MOUTH EVERY DAY FOR HTN  3  . Cholecalciferol (VITAMIN D3) 1.25 MG (50000 UT) CAPS Take 1 capsule by mouth once a  week.    . dexamethasone (DECADRON) 4 MG tablet Take 1 tablet (4 mg total) by mouth daily. 30 tablet 0  . ferrous sulfate 325 (65 FE) MG tablet Take 325 mg by mouth 2 (two) times daily with a meal.    . HYDROcodone-acetaminophen (NORCO) 5-325 MG tablet Take 1-2 tablets by mouth every 8 (eight) hours as needed for severe pain. 30 tablet 0  . valACYclovir (VALTREX) 500 MG tablet Take 500 mg by mouth 2 (two) times daily.    . Vitamin D, Ergocalciferol, (DRISDOL) 50000 units CAPS capsule Take 50,000 Units by mouth every Monday.     Marland Kitchen buPROPion (WELLBUTRIN XL) 150 MG 24 hr tablet 450 mg daily (150 mg + 300 mg) (Patient not taking: No sig reported) 30 tablet 0  .  sertraline (ZOLOFT) 50 MG tablet Take 1 tablet (50 mg total) by mouth at bedtime. (Patient not taking: No sig reported) 90 tablet 0  . traZODone (DESYREL) 50 MG tablet Take 1 tablet (50 mg total) by mouth at bedtime. (Patient not taking: No sig reported) 90 tablet 0   No current facility-administered medications for this encounter.     REVIEW OF SYSTEMS: On review of systems, the patient reports that she is doing fairly well. She's been using a walker since having surgery on her right foot a few weeks ago. She's been doing well and all the pins and sutures have been removed and she's walking with a boot and only able to weight bear on her right heal. She reports no left sided shoulder pain but is having some pain in her right rib cage below the level of her bra line. She denies any other pain and reports she's taking oxycodone or hydrocodone as needed for pain which helps but she doesn't like using this due to sleepiness. No other complaints are verbalized.    PHYSICAL EXAM:  Unable to assess vitals Pain Assessment Pain Score: 3 /10  In general this is a well appearing African American female in no acute distress. She's alert and oriented x4 and appropriate throughout the examination. Cardiopulmonary assessment is negative for acute distress and she exhibits normal effort.     ECOG = 1  0 - Asymptomatic (Fully active, able to carry on all predisease activities without restriction)  1 - Symptomatic but completely ambulatory (Restricted in physically strenuous activity but ambulatory and able to carry out work of a light or sedentary nature. For example, light housework, office work)  2 - Symptomatic, <50% in bed during the day (Ambulatory and capable of all self care but unable to carry out any work activities. Up and about more than 50% of waking hours)  3 - Symptomatic, >50% in bed, but not bedbound (Capable of only limited self-care, confined to bed or chair 50% or more of waking  hours)  4 - Bedbound (Completely disabled. Cannot carry on any self-care. Totally confined to bed or chair)  5 - Death   Eustace Pen MM, Creech RH, Tormey DC, et al. [phone]). "Toxicity and response criteria of the [facility]". [place] Oncol. 5 (6): 649-55    LABORATORY DATA:  Lab Results  Component Value Date   WBC 6.6 07/20/2020   HGB 9.2 (L) 07/20/2020   HCT 28.3 (L) 07/20/2020   MCV 72.0 (L) 07/20/2020   PLT 179 07/20/2020   Lab Results  Component Value Date   NA 142 07/20/2020   K 3.8 07/20/2020   CL 103 07/20/2020   CO2  25 07/20/2020   Lab Results  Component Value Date   ALT 8 07/20/2020   AST 13 (L) 07/20/2020   ALKPHOS 68 07/20/2020   BILITOT 0.3 07/20/2020      RADIOGRAPHY: MR Lumbar Spine w/o contrast  Result Date: 07/03/2020 CLINICAL DATA:  Low back pain. Bilateral hip pain with numbness in left foot. Per chart review, the patient has known multiple myeloma. EXAM: MRI LUMBAR SPINE WITHOUT CONTRAST TECHNIQUE: Multiplanar, multisequence MR imaging of the lumbar spine was performed. No intravenous contrast was administered. COMPARISON:  MRI 12/14/2017. FINDINGS: Segmentation: The inferior-most fully formed intervertebral disc is labeled L5-S1. Alignment:  Grade 1 anterolisthesis of L4 on L5. Vertebrae: There are numerous T1 hypointense and STIR hyperintense lesions throughout the lumbar spine (both vertebral bodies and posterior elements) and the sacrum. The largest lesion in the visualized spine involves the posterior/superior aspect of the T12 vertebral body and measures approximately 13 mm. There is an additional 12 mm lesion involving the right T12 posterior elements. Additional numerous lesions in the spine are largely subcentimeter. In the sacrum, there is involvement of the right ilium, left sacral ala, and the S1 vertebral body. There is expansile extraosseous extension of these lesions into the right gluteal soft tissues, left presacral soft  tissues, and the partially imaged left S1-S2 neural foramen where it contacts the partially imaged left exiting S1 nerve. Conus medullaris and cauda equina: Conus extends to the L1 level. Conus appears normal. Paraspinal and other soft tissues: Extraosseous sacral extent of myeloma is detailed above. Otherwise, unremarkable Disc levels: T12-L1: Tiny right paracentral disc bulge without significant canal or foraminal stenosis. L1-L2: No significant disc protrusion, foraminal stenosis, or canal stenosis. L2-L3: No significant disc protrusion, foraminal stenosis, or canal stenosis. L3-L4: Small right eccentric broad-based disc bulge and mild bilateral facet hypertrophy without significant canal or foraminal stenosis. L4-L5: Grade 1 anterolisthesis of L4 on L5. Right eccentric broad-based disc bulge with central annular fissure. Moderate bilateral facet hypertrophy. No significant canal or foraminal stenosis. L5-S1: Mild broad-based disc bulge and moderate to severe bilateral facet hypertrophy without significant canal or foraminal stenosis. The exited left L5 nerve is contacted by the presacral extraosseous extension of myeloma (see series 14, image 39). IMPRESSION: 1. Numerous T1 hypointense and STIR hyperintense lesions throughout the visualized spine and sacrum with multiple areas of extraosseous extension in the sacrum, detailed above and compatible with multiple myeloma given the clinical history. 2. An MRI of the pelvis with contrast could evaluate the full extent of the partially imaged sacral lesions, including left S1-S2 neural foraminal involvement and suspected involvement of the exited left L5 nerve. 3. Multilevel degenerative change without significant canal or foraminal stenosis in the lumbar spine. Electronically Signed   By: Feliberto Harts MD   On: 07/03/2020 21:54   NM PET Image Initial (PI) Whole Body  Result Date: 07/25/2020 CLINICAL DATA:  Initial treatment strategy for multiple myeloma.  EXAM: NUCLEAR MEDICINE PET WHOLE BODY TECHNIQUE: 11.1 mCi F-18 FDG was injected intravenously. Full-ring PET imaging was performed from the head to foot after the radiotracer. CT data was obtained and used for attenuation correction and anatomic localization. Fasting blood glucose: 85 mg/dl COMPARISON:  MRI 48/68/8520 FINDINGS: Mediastinal blood pool activity: SUV max 2.1 HEAD/NECK: No hypermetabolic activity in the scalp. No hypermetabolic cervical lymph nodes. Incidental CT findings: none CHEST: No hypermetabolic mediastinal or hilar nodes. No suspicious pulmonary nodules on the CT scan. Incidental CT findings: none ABDOMEN/PELVIS: No abnormal hypermetabolic activity within the liver,  pancreas, adrenal glands, or spleen. No hypermetabolic lymph nodes in the abdomen or pelvis. Incidental CT findings: Postcholecystectomy SKELETON: Dominant finding is 2 large soft tissue expansile lesion centered within the pelvis. Lesion centered in the LEFT sacral ala measures 5.6 by 5.0 cm with SUV max equal 6.4. Similar expansile lesion centered in the posterior aspect of the RIGHT iliac bone measures 6.4 x 4.6 cm SUV max equal 6.1. More subtle lesion in the LEFT scapula along the glenoid fossa with SUV max equal 3.2. There is a lytic lesion measuring 1.2 cm on image 68. There is intense metabolic activity within the musculature posterior to the LEFT shoulder which is favored inflammatory/traumatic (image 76). Uptake in the RIGHT shoulder is favored degenerative. Incidental CT findings: none EXTREMITIES: Intense radiotracer activity associated with the RIGHT knee prosthetic is favored inflammatory. Likewise intense activity associated with the RIGHT metatarsal phalanges fusion is favored inflammatory. Intense uptake in the hands is favored physiologic. Incidental CT findings: none IMPRESSION: 1. Moderate to high hypermetabolic activity associated with the expansile soft tissue masses within the LEFT and RIGHT pelvic bones is  consistent with active multiple myeloma / plasmacytoma. 2. Lytic lesion in the LEFT scapula with mild metabolic activity is indeterminate. 3. Metabolic activity associated with the RIGHT knee prosthetic and RIGHT distal foot fusion is favored post procedural inflammation. Electronically Signed   By: Suzy Bouchard M.D.   On: 07/25/2020 10:18       IMPRESSION/PLAN: 1. Multiple Myeloma not having achieved remission. Dr. Lisbeth Renshaw discusses the patient's diagnosis and course to date. He reviews her imaging and personal review of the films. He would offer the patient a palliative course of radiotherapy to the pelvis as one isocenter, as well as to the left scapular area of the shoulder region given there is a . We discussed the risks, benefits, short, and long term effects of radiotherapy and the patient is interested in proceeding. Dr. Lisbeth Renshaw discusses the delivery and logistics of radiotherapy and anticipates a course of 2 weeks of radiotherapy to the pelvis and left shoulder region, as well as possible right rib. We can wire this location at the time of simulation to determine if there is an appropriate target. She will simulate tomorrow and we will start treatment later this week. She will also see Dr. Burr Medico again this week in lieu of Velcade treatment. She will also continue her current pain regiment and possibly add NSAIDs to this as well.   This encounter was provided by telemedicine platform MyChart.  The patient has provided two factor identification and has given verbal consent for this type of encounter and has been advised to only accept a meeting of this type in a secure network environment. The time spent during this encounter was 60 minutes including preparation, discussion, and coordination of the patient's care. The attendants for this meeting include Dr. Lisbeth Renshaw, Hayden Pedro  and [name].  During the encounter,Dr. Lisbeth Renshaw, and Hayden Pedro were located at [facility] Radiation Oncology Department.  Hannah [name] was located at home.     The above documentation reflects my direct findings during this shared patient visit. Please see the separate note by Dr. Lisbeth Renshaw on this date for the remainder of the patient's plan of care.    Carola Rhine, [facility]   **Disclaimer: This note was dictated with voice recognition software. Similar sounding words can inadvertently be transcribed and this note may contain transcription errors which may not have been  corrected upon publication of note.**

## 2020-07-29 ENCOUNTER — Ambulatory Visit
Admission: RE | Admit: 2020-07-29 | Discharge: 2020-07-29 | Disposition: A | Discharge status: Home / Self Care | Payer: BC Managed Care – PPO | Source: Ambulatory Visit | Attending: Radiation Oncology | Admitting: Radiation Oncology

## 2020-07-29 ENCOUNTER — Other Ambulatory Visit: Payer: Self-pay

## 2020-07-29 DIAGNOSIS — Z510 Encounter for antineoplastic radiation therapy: Secondary | ICD-10-CM | POA: Insufficient documentation

## 2020-07-29 DIAGNOSIS — C7951 Secondary malignant neoplasm of bone: Secondary | ICD-10-CM | POA: Diagnosis not present

## 2020-07-29 DIAGNOSIS — C9000 Multiple myeloma not having achieved remission: Secondary | ICD-10-CM | POA: Diagnosis not present

## 2020-07-30 ENCOUNTER — Ambulatory Visit: Payer: BC Managed Care – PPO | Admitting: Radiation Oncology

## 2020-07-30 ENCOUNTER — Ambulatory Visit (HOSPITAL_COMMUNITY)
Admission: RE | Admit: 2020-07-30 | Discharge: 2020-07-30 | Disposition: A | Discharge status: Home / Self Care | Payer: BC Managed Care – PPO | Source: Ambulatory Visit | Attending: Hematology | Admitting: Hematology

## 2020-07-30 ENCOUNTER — Other Ambulatory Visit: Payer: Self-pay

## 2020-07-30 ENCOUNTER — Ambulatory Visit
Admission: RE | Admit: 2020-07-30 | Discharge: 2020-07-30 | Disposition: A | Discharge status: Home / Self Care | Payer: BC Managed Care – PPO | Source: Ambulatory Visit | Attending: Radiation Oncology | Admitting: Radiation Oncology

## 2020-07-30 ENCOUNTER — Encounter (HOSPITAL_COMMUNITY): Payer: Self-pay

## 2020-07-30 DIAGNOSIS — Z79899 Other long term (current) drug therapy: Secondary | ICD-10-CM | POA: Insufficient documentation

## 2020-07-30 DIAGNOSIS — G8929 Other chronic pain: Secondary | ICD-10-CM | POA: Diagnosis not present

## 2020-07-30 DIAGNOSIS — D509 Iron deficiency anemia, unspecified: Secondary | ICD-10-CM | POA: Diagnosis not present

## 2020-07-30 DIAGNOSIS — D649 Anemia, unspecified: Secondary | ICD-10-CM | POA: Insufficient documentation

## 2020-07-30 DIAGNOSIS — M549 Dorsalgia, unspecified: Secondary | ICD-10-CM | POA: Insufficient documentation

## 2020-07-30 DIAGNOSIS — D72822 Plasmacytosis: Secondary | ICD-10-CM | POA: Diagnosis not present

## 2020-07-30 DIAGNOSIS — C9000 Multiple myeloma not having achieved remission: Secondary | ICD-10-CM | POA: Diagnosis not present

## 2020-07-30 DIAGNOSIS — C7951 Secondary malignant neoplasm of bone: Secondary | ICD-10-CM | POA: Diagnosis not present

## 2020-07-30 DIAGNOSIS — Z510 Encounter for antineoplastic radiation therapy: Secondary | ICD-10-CM | POA: Diagnosis not present

## 2020-07-30 LAB — CBC WITH DIFFERENTIAL/PLATELET
Abs Immature Granulocytes: 0.03 10*3/uL (ref 0.00–0.07)
Basophils Absolute: 0 10*3/uL (ref 0.0–0.1)
Basophils Relative: 0 %
Eosinophils Absolute: 0 10*3/uL (ref 0.0–0.5)
Eosinophils Relative: 0 %
HCT: 28.3 % — ABNORMAL LOW (ref 36.0–46.0)
Hemoglobin: 9.2 g/dL — ABNORMAL LOW (ref 12.0–15.0)
Immature Granulocytes: 0 %
Lymphocytes Relative: 24 %
Lymphs Abs: 1.9 10*3/uL (ref 0.7–4.0)
MCH: 23.6 pg — ABNORMAL LOW (ref 26.0–34.0)
MCHC: 32.5 g/dL (ref 30.0–36.0)
MCV: 72.6 fL — ABNORMAL LOW (ref 80.0–100.0)
Monocytes Absolute: 0.5 10*3/uL (ref 0.1–1.0)
Monocytes Relative: 6 %
Neutro Abs: 5.3 10*3/uL (ref 1.7–7.7)
Neutrophils Relative %: 70 %
Platelets: 216 10*3/uL (ref 150–400)
RBC: 3.9 MIL/uL (ref 3.87–5.11)
RDW: 16.7 % — ABNORMAL HIGH (ref 11.5–15.5)
WBC: 7.7 10*3/uL (ref 4.0–10.5)
nRBC: 0 % (ref 0.0–0.2)

## 2020-07-30 LAB — PROTIME-INR
INR: 1 (ref 0.8–1.2)
Prothrombin Time: 12.8 seconds (ref 11.4–15.2)

## 2020-07-30 MED ORDER — FENTANYL CITRATE (PF) 100 MCG/2ML IJ SOLN
INTRAMUSCULAR | Status: AC | PRN
  Administered 2020-07-30 (×2): 50 ug via INTRAVENOUS

## 2020-07-30 MED ORDER — LIDOCAINE HCL (PF) 1 % IJ SOLN
INTRAMUSCULAR | Status: AC | PRN
  Administered 2020-07-30: 10 mL

## 2020-07-30 MED ORDER — FENTANYL CITRATE (PF) 100 MCG/2ML IJ SOLN
INTRAMUSCULAR | Status: AC
  Filled 2020-07-30: qty 2

## 2020-07-30 NOTE — Sedation Documentation (Signed)
No sedation pt had breakfast

## 2020-07-30 NOTE — Discharge Instructions (Signed)
Please call Interventional Radiology clinic [phone] with any questions or concerns.  You may remove your dressing and shower tomorrow.   Bone Marrow Aspiration and Bone Marrow Biopsy, Adult, Care After This sheet gives you information about how to care for yourself after your procedure. Your health care provider may also give you more specific instructions. If you have problems or questions, contact your health care provider. What can I expect after the procedure? After the procedure, it is common to have:  Mild pain and tenderness.  Swelling.  Bruising. Follow these instructions at home: Puncture site care  Follow instructions from your health care provider about how to take care of the puncture site. Make sure you: ? Wash your hands with soap and water before and after you change your bandage (dressing). If soap and water are not available, use hand sanitizer. ? Change your dressing as told by your health care provider.  Check your puncture site every day for signs of infection. Check for: ? More redness, swelling, or pain. ? Fluid or blood. ? Warmth. ? Pus or a bad smell.   Activity  Return to your normal activities as told by your health care provider. Ask your health care provider what activities are safe for you.  Do not lift anything that is heavier than 10 lb (4.5 kg), or the limit that you are told, until your health care provider says that it is safe.  Do not drive for 24 hours if you were given a sedative during your procedure. General instructions  Take over-the-counter and prescription medicines only as told by your health care provider.  Do not take baths, swim, or use a hot tub until your health care provider approves. Ask your health care provider if you may take showers. You may only be allowed to take sponge baths.  If directed, put ice on the affected area. To do this: ? Put ice in a plastic bag. ? Place a towel between your skin and the bag. ? Leave  the ice on for 20 minutes, 2-3 times a day.  Keep all follow-up visits as told by your health care provider. This is important.   Contact a health care provider if:  Your pain is not controlled with medicine.  You have a fever.  You have more redness, swelling, or pain around the puncture site.  You have fluid or blood coming from the puncture site.  Your puncture site feels warm to the touch.  You have pus or a bad smell coming from the puncture site. Summary  After the procedure, it is common to have mild pain, tenderness, swelling, and bruising.  Follow instructions from your health care provider about how to take care of the puncture site and what activities are safe for you.  Take over-the-counter and prescription medicines only as told by your health care provider.  Contact a health care provider if you have any signs of infection, such as fluid or blood coming from the puncture site. This information is not intended to replace advice given to you by your health care provider. Make sure you discuss any questions you have with your health care provider. Document Revised: 10/02/2018 Document Reviewed: 10/02/2018 Elsevier Patient Education  2021 [employer]  

## 2020-07-30 NOTE — H&P (Signed)
Chief Complaint: Patient was seen in consultation today for image guided bone marrow aspiration and biopsy at the request of Feng,Yan  Referring Physician(s): Feng,Yan  Supervising Physician: Jacqulynn Cadet  Patient Status: Hannah [name]  History of Present Illness: Hannah [name] is a 60 y.o. female new to our service. 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  Patient had abnormal lab result at her rheumatology office in 2019 which prompted her for oncology referral.  She underwent for bone marrow biopsy which revealed smoldering multiple myeloma.  Patient has been followed closely by oncology. She had a MRI of L-spine done due to worsening of chronic back pain on 07/03/2020 which revealed multiple hypointense and hyperintense lesions throughout the spine which compatible with multiple myeloma.  Subsequent PET scan also showed moderate to high hypermetabolic activity with expansile soft tissue masses consistent with active multiple myeloma/plasmacytoma.   IR was requested for image guided bone marrow aspiration and biopsy  Patient laying in bed, not in acute distress.  Denise headache, fever, chills, shortness of breath, cough, chest pain, abdominal pain, nausea ,vomiting, and bleeding.    Past Medical History:  Diagnosis Date  . Allergy   . Anemia   . Anxiety   . Arthritis   . Asthma   . Depression   . Hypertension     Past Surgical History:  Procedure Laterality Date  . CHOLECYSTECTOMY  1993  . COLONOSCOPY    . 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 TOTAL KNEE ARTHROPLASTY Right 01/28/2016   Procedure: RIGHT TOTAL KNEE ARTHROPLASTY;  Surgeon: [name], MD;  Location: [facility];  Service: Orthopedics;  Laterality: Right;  . TRIGGER FINGER RELEASE Right 06/18/2014   Procedure: RIGHT LONG FINGER TRIGGER RELEASE;  Surgeon: [name], MD;  Location: [place] [facility];  Service: Orthopedics;  Laterality: Right;  . TUBAL LIGATION    . VAGINAL HYSTERECTOMY Bilateral 11/04/2016   Procedure: HYSTERECTOMY VAGINAL with Bilateral Salpingectomy;  Surgeon: [name], MD;  Location: [facility];  Service: Gynecology;  Laterality: Bilateral;  . WEIL OSTEOTOMY Right 07/07/2020   Procedure: RIGHT FOOT WEIL OSTEOTOMY 2, 3, AND 4 METATARSALS AND PROXIMAL INTERPHALANGEAL JOINT FUSION 2 & 4 TOES;  Surgeon: Newt Minion, MD;  Location: [place];  Service: Orthopedics;  Laterality: Right;    Allergies: Elemental sulfur  Medications: Prior to Admission medications   Medication Sig Start Date End Date Taking? Authorizing Provider  albuterol (VENTOLIN HFA) 108 (90 Base) MCG/ACT inhaler Inhale into the lungs. 05/21/20   [provider]  budesonide-formoterol (SYMBICORT) 160-4.5 MCG/ACT inhaler Inhale 2 puffs into the lungs daily as needed.    [provider]  buPROPion (WELLBUTRIN XL) 150 MG 24 hr tablet 450 mg daily (150 mg + 300 mg) Patient not taking: No sig reported 07/26/18   Norman Clay, MD  chlorthalidone (HYGROTON) 25 MG tablet TAKE 1 TABLET BY MOUTH EVERY DAY FOR HTN 07/05/17   [provider]  Cholecalciferol (VITAMIN D3) 1.25 MG (50000 UT) CAPS Take 1 capsule by mouth once a week. 04/30/20   [provider]  dexamethasone (DECADRON) 4 MG tablet Take 1 tablet (4 mg total) by mouth daily. 07/27/20   Alla Feeling, NP  ferrous sulfate 325 (65 FE) MG tablet Take 325 mg by mouth 2 (two) times daily with a meal.    [provider]  HYDROcodone-acetaminophen (NORCO) 5-325 MG tablet Take 1-2 tablets by mouth every 8 (eight) hours as needed for severe pain. 07/27/20   Alla Feeling, NP  sertraline (ZOLOFT) 50 MG tablet Take 1 tablet (50 mg total) by mouth at bedtime. Patient not taking: No sig reported 08/13/18   Norman Clay, MD  traZODone (DESYREL) 50 MG tablet Take  1 tablet (50 mg total) by mouth at bedtime. Patient not taking: No sig reported 12/13/18   Norman Clay, MD  valACYclovir (VALTREX) 500 MG tablet Take 500 mg by mouth 2 (two) times daily. 04/27/20   [provider]  Vitamin D, Ergocalciferol, (DRISDOL) 50000 units CAPS capsule Take 50,000 Units by mouth every Monday.     [provider]     Family History  Problem Relation Age of Onset  . Cancer Mother        uterine   . Asthma Mother   . Arthritis Daughter   . Depression Maternal Uncle   . Colon cancer Neg Hx     Social History   Socioeconomic History  . Marital status: Married    Spouse name: Not on file  . Number of children: Not on file  . Years of education: Not on file  . Highest education level: Not on file  Occupational History  . Not on file  Tobacco Use  . Smoking status: Never Smoker  . Smokeless tobacco: Never Used  Vaping Use  . Vaping Use: Never used  Substance and Sexual Activity  . Alcohol use: No    Alcohol/week: 0.0 standard drinks  . Drug use: No  . Sexual activity: Yes    Partners: Male    Birth control/protection: Surgical  Other Topics Concern  . Not on file  Social History Narrative  . Not on file   Social Determinants of Health   Financial Resource Strain: Low Risk   . Difficulty of Paying Living Expenses: Not hard at all  Food Insecurity: No Food Insecurity  . Worried About Charity fundraiser in the Last Year: Never true  . Ran Out of Food in the Last Year: Never true  Transportation Needs: No Transportation Needs  . Lack of Transportation (Medical): No  . Lack of Transportation (Non-Medical): No  Physical Activity: Not on file  Stress: Not on file  Social Connections: Moderately Integrated  . Frequency of Communication with Friends and Family: More than three times a week  . Frequency of Social Gatherings with Friends and Family: More than three times a week  . Attends Religious Services: More than 4 times per year   . Active Member of Clubs or Organizations: No  . Attends Archivist Meetings: Never  . Marital Status: Married     Review of Systems: A 12 point ROS discussed and pertinent positives are indicated in the HPI above.  All other systems are negative.   Vital Signs: BP (!) 152/95 (BP Location: Right Arm)   Pulse 95   Temp 98 F (36.7 C) (Oral)   Resp 18   LMP 10/22/2016 (Exact Date)   SpO2 96%   Physical Examination   Constitutional:      Appearance: Normal appearance.  Cardiovascular:     Rate and Rhythm: Normal rate and regular rhythm.     Pulses: Normal pulses.     Heart sounds: Normal heart sounds.  Pulmonary:     Effort: Pulmonary effort is normal.     Breath sounds: Normal breath sounds.  Abdominal:  General: Abdomen is flat. Bowel sounds are normal.     Palpations: Abdomen is soft.  Neurological:     Mental Status: Patient is alert and oriented to person, place, and time.    MD Evaluation Airway: WNL Heart: WNL Abdomen: WNL Chest/ Lungs: WNL ASA  Classification: 3 Mallampati/Airway Score: Two  Imaging: MR Lumbar Spine w/o contrast  Result Date: 07/03/2020 CLINICAL DATA:  Low back pain. Bilateral hip pain with numbness in left foot. Per chart review, the patient has known multiple myeloma. EXAM: MRI LUMBAR SPINE WITHOUT CONTRAST TECHNIQUE: Multiplanar, multisequence MR imaging of the lumbar spine was performed. No intravenous contrast was administered. COMPARISON:  MRI 12/14/2017. FINDINGS: Segmentation: The inferior-most fully formed intervertebral disc is labeled L5-S1. Alignment:  Grade 1 anterolisthesis of L4 on L5. Vertebrae: There are numerous T1 hypointense and STIR hyperintense lesions throughout the lumbar spine (both vertebral bodies and posterior elements) and the sacrum. The largest lesion in the visualized spine involves the posterior/superior aspect of the T12 vertebral body and measures approximately 13 mm. There is an additional 12 mm  lesion involving the right T12 posterior elements. Additional numerous lesions in the spine are largely subcentimeter. In the sacrum, there is involvement of the right ilium, left sacral ala, and the S1 vertebral body. There is expansile extraosseous extension of these lesions into the right gluteal soft tissues, left presacral soft tissues, and the partially imaged left S1-S2 neural foramen where it contacts the partially imaged left exiting S1 nerve. Conus medullaris and cauda equina: Conus extends to the L1 level. Conus appears normal. Paraspinal and other soft tissues: Extraosseous sacral extent of myeloma is detailed above. Otherwise, unremarkable Disc levels: T12-L1: Tiny right paracentral disc bulge without significant canal or foraminal stenosis. L1-L2: No significant disc protrusion, foraminal stenosis, or canal stenosis. L2-L3: No significant disc protrusion, foraminal stenosis, or canal stenosis. L3-L4: Small right eccentric broad-based disc bulge and mild bilateral facet hypertrophy without significant canal or foraminal stenosis. L4-L5: Grade 1 anterolisthesis of L4 on L5. Right eccentric broad-based disc bulge with central annular fissure. Moderate bilateral facet hypertrophy. No significant canal or foraminal stenosis. L5-S1: Mild broad-based disc bulge and moderate to severe bilateral facet hypertrophy without significant canal or foraminal stenosis. The exited left L5 nerve is contacted by the presacral extraosseous extension of myeloma (see series 14, image 39). IMPRESSION: 1. Numerous T1 hypointense and STIR hyperintense lesions throughout the visualized spine and sacrum with multiple areas of extraosseous extension in the sacrum, detailed above and compatible with multiple myeloma given the clinical history. 2. An MRI of the pelvis with contrast could evaluate the full extent of the partially imaged sacral lesions, including left S1-S2 neural foraminal involvement and suspected involvement of the  exited left L5 nerve. 3. Multilevel degenerative change without significant canal or foraminal stenosis in the lumbar spine. Electronically Signed   By: Margaretha Sheffield MD   On: 07/03/2020 21:54   NM PET Image Initial (PI) Whole Body  Result Date: 07/25/2020 CLINICAL DATA:  Initial treatment strategy for multiple myeloma. EXAM: NUCLEAR MEDICINE PET WHOLE BODY TECHNIQUE: 11.1 mCi F-18 FDG was injected intravenously. Full-ring PET imaging was performed from the head to foot after the radiotracer. CT data was obtained and used for attenuation correction and anatomic localization. Fasting blood glucose: 85 mg/dl COMPARISON:  MRI 07/03/2020 FINDINGS: Mediastinal blood pool activity: SUV max 2.1 HEAD/NECK: No hypermetabolic activity in the scalp. No hypermetabolic cervical lymph nodes. Incidental CT findings: none CHEST: No hypermetabolic mediastinal or hilar nodes.  No suspicious pulmonary nodules on the CT scan. Incidental CT findings: none ABDOMEN/PELVIS: No abnormal hypermetabolic activity within the liver, pancreas, adrenal glands, or spleen. No hypermetabolic lymph nodes in the abdomen or pelvis. Incidental CT findings: Postcholecystectomy SKELETON: Dominant finding is 2 large soft tissue expansile lesion centered within the pelvis. Lesion centered in the LEFT sacral ala measures 5.6 by 5.0 cm with SUV max equal 6.4. Similar expansile lesion centered in the posterior aspect of the RIGHT iliac bone measures 6.4 x 4.6 cm SUV max equal 6.1. More subtle lesion in the LEFT scapula along the glenoid fossa with SUV max equal 3.2. There is a lytic lesion measuring 1.2 cm on image 68. There is intense metabolic activity within the musculature posterior to the LEFT shoulder which is favored inflammatory/traumatic (image 76). Uptake in the RIGHT shoulder is favored degenerative. Incidental CT findings: none EXTREMITIES: Intense radiotracer activity associated with the RIGHT knee prosthetic is favored inflammatory.  Likewise intense activity associated with the RIGHT metatarsal phalanges fusion is favored inflammatory. Intense uptake in the hands is favored physiologic. Incidental CT findings: none IMPRESSION: 1. Moderate to high hypermetabolic activity associated with the expansile soft tissue masses within the LEFT and RIGHT pelvic bones is consistent with active multiple myeloma / plasmacytoma. 2. Lytic lesion in the LEFT scapula with mild metabolic activity is indeterminate. 3. Metabolic activity associated with the RIGHT knee prosthetic and RIGHT distal foot fusion is favored post procedural inflammation. Electronically Signed   By: Suzy Bouchard M.D.   On: 07/25/2020 10:18   XR Foot 2 Views Right  Result Date: 07/28/2020 Three-view radiographs of the right foot shows a congruent metatarsal head alignment status post Weil osteotomies for the second third and fourth metatarsals.  Status post PIP resection of the second and fourth toes which are straight.   Labs:  CBC: Recent Labs    03/19/20 1252 06/01/20 0751 07/13/20 1431 07/20/20 0927  WBC 10.5 7.3 8.5 6.6  HGB 11.1* 10.7* 10.7* 9.2*  HCT 34.5* 33.2* 32.7* 28.3*  PLT 241 216 206 179    COAGS: No results for input(s): INR, APTT in the last 8760 hours.  BMP: Recent Labs    08/16/19 0817 10/29/19 1017 02/04/20 1152 03/19/20 1252 06/01/20 0751 07/03/20 1524 07/13/20 1431 07/20/20 0927  NA 141 141 138   < > 142 139 140 142  K 3.4* 3.5 3.9   < > 3.5 3.4* 3.7 3.8  CL 105 100 100   < > 101 99 101 103  CO2 _0 < > 32 _1 GLUCOSE 104* 91 97   < > 104* 84 98 97  BUN _2 < > _3 CALCIUM 8.3* 9.1 9.4   < > 9.5 9.4 9.8 8.9  CREATININE 0.79 0.82 0.88   < > 0.82 0.86 0.93 0.83  GFRNONAA >60 >60 >60   < > >60 >60 >60 >60  GFRAA >60 >60 >60  --   --   --   --   --    < > = values in this interval not displayed.    LIVER FUNCTION TESTS: Recent Labs    03/19/20 1252 06/01/20 0751 07/13/20 1431  07/20/20 0927  BILITOT 0.4 0.3 0.4 0.3  AST 12* 19 14* 13*  ALT _4 ALKPHOS 81 74 66 68  PROT 9.3* 9.6* 10.5* 9.7*  ALBUMIN 3.4* 3.1* 3.2* 3.0*    TUMOR MARKERS:  No results for input(s): AFPTM, CEA, CA199, CHROMGRNA in the last 8760 hours.  Assessment and Plan: 60 y.o. 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 Patient has been followed by oncology closely since 2019 when she was originally diagnosed with smoldering multiple myeloma. Recent imaging revealed concerning bony lesions for multiple myeloma.  Patient has been referred to IR for bone marrow aspiration and biopsy for further multiple myeloma evaluation.  Patient presents to IR today for the bone marrow aspiration and biopsy.  Patient had 8 ounces of milk at 7 AM this morning. Discussed with Dr. Laurence Ferrari, who said we can proceed with fentanyl only. Informed the patient, she is agreeable to proceed. Labs pending.  Risks and benefits of bone marrow aspiration and biopsy was discussed with the patient and/or patient's family including, but not limited to bleeding, infection, damage to adjacent structures or low yield requiring additional tests.  All of the questions were answered and there is agreement to proceed.  Consent signed and in chart.   Thank you for this interesting consult.  I greatly enjoyed meeting MANAMI TUTOR and look forward to participating in their care.  A copy of this report was sent to the requesting provider on this date.  Electronically Signed: Tera Mater, PA-C 07/30/2020, 8:36 AM   I spent a total of  15 Minutes   in face to face in clinical consultation, greater than 50% of which was counseling/coordinating care for image guided bone marrow aspiration and biopsy.

## 2020-07-30 NOTE — Procedures (Signed)
Interventional Radiology Procedure Note  Procedure: CT guided aspirate and core biopsy of right iliac bone Complications: None Recommendations: - Bedrest supine x 1 hrs - Hydrocodone PRN  Pain - Follow biopsy results  Signed,  Damontae Loppnow K. Aggie Douse, MD   

## 2020-07-30 NOTE — Progress Notes (Signed)
Patient transported via wheelchair by staff to Radiation Oncology with no issues noted.

## 2020-07-31 ENCOUNTER — Other Ambulatory Visit: Payer: Self-pay

## 2020-07-31 ENCOUNTER — Ambulatory Visit
Admission: RE | Admit: 2020-07-31 | Discharge: 2020-07-31 | Disposition: A | Discharge status: Home / Self Care | Payer: BC Managed Care – PPO | Source: Ambulatory Visit | Attending: Radiation Oncology | Admitting: Radiation Oncology

## 2020-07-31 DIAGNOSIS — Z510 Encounter for antineoplastic radiation therapy: Secondary | ICD-10-CM | POA: Diagnosis not present

## 2020-07-31 DIAGNOSIS — C9000 Multiple myeloma not having achieved remission: Secondary | ICD-10-CM | POA: Diagnosis not present

## 2020-07-31 DIAGNOSIS — C7951 Secondary malignant neoplasm of bone: Secondary | ICD-10-CM | POA: Diagnosis not present

## 2020-07-31 NOTE — Progress Notes (Signed)
[place]   [phone] [phone]   Clinic Follow up Note   Patient Care Team: Tisovec, Fransico Him, MD as PCP - General (Internal Medicine) Bo Merino, MD as Consulting Physician (Rheumatology)  Date of Service:  08/03/2020  CHIEF COMPLAINT: Follow-upof multiple myeloma  SUMMARY OF ONCOLOGIC HISTORY: Oncology History Overview Note  Cancer Staging Multiple myeloma ([place]) Staging form: Plasma Cell Myeloma and Plasma Cell Disorders, AJCC 8th Edition - Clinical stage from 07/20/2020: Beta-2-microglobulin (mg/L): 2.4, Albumin (g/dL): 3.2, ISS: Stage II, High-risk cytogenetics: Unknown, LDH: Normal - Signed by Truitt Merle, MD on 08/02/2020 Beta 2 microglobulin range (mg/L): Less than 3.5 Albumin range (g/dL): Less than 3.5 Cytogenetics: Unknown    Multiple myeloma ([place])  07/03/2020 Imaging   MRI Lumbar Spine  IMPRESSION: 1. Numerous T1 hypointense and STIR hyperintense lesions throughout the visualized spine and sacrum with multiple areas of extraosseous extension in the sacrum, detailed above and compatible with multiple myeloma given the clinical history. 2. An MRI of the pelvis with contrast could evaluate the full extent of the partially imaged sacral lesions, including left S1-S2 neural foraminal involvement and suspected involvement of the exited left L5 nerve. 3. Multilevel degenerative change without significant canal or foraminal stenosis in the lumbar spine.   07/13/2020 Initial Diagnosis   Multiple myeloma ([place])   07/20/2020 Cancer Staging   Staging form: Plasma Cell Myeloma and Plasma Cell Disorders, AJCC 8th Edition - Clinical stage from 07/20/2020: Beta-2-microglobulin (mg/L): 2.4, Albumin (g/dL): 3.2, ISS: Stage II, High-risk cytogenetics: Unknown, LDH: Normal - Signed by Truitt Merle, MD on 08/02/2020 Beta 2 microglobulin range (mg/L): Less than 3.5 Albumin range (g/dL): Less than 3.5 Cytogenetics: Unknown   07/24/2020 PET scan    IMPRESSION: 1. Moderate to high hypermetabolic activity associated with the expansile soft tissue masses within the LEFT and RIGHT pelvic bones is consistent with active multiple myeloma / plasmacytoma. 2. Lytic lesion in the LEFT scapula with mild metabolic activity is indeterminate. 3. Metabolic activity associated with the RIGHT knee prosthetic and RIGHT distal foot fusion is favored post procedural inflammation.     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  The findings are most consistent with  persistent/recurrent/previously known plasma cell neoplasm.  For  completeness, immunohistochemical stain for CD138 and in situ  hybridization for kappa and lambda will be performed and the results  reported in an addendum.  Correlation with cytogenetic and FISH studies  is recommended.   08/03/2020 -  Chemotherapy   Zometa starting 08/03/20    08/17/2020 -  Chemotherapy   PENDING Velcade weekly, Revlimid, dex $RemoveBefor'20mg'KvTgxcPAUXSB$  weekly (VRd)  Starting 08/17/20.        CURRENT THERAPY:  Palliative Radiation to Pelvic lesion with Dr Lisbeth Renshaw 07/30/20-08/12/20 Zometa starting 08/03/20 PENDING Velcade, Revlimid, dex (VRd)  Starting 08/17/20.   INTERVAL HISTORY:  Hannah [name] is here for a follow up. She presents to the clinic alone. She notes she is doing well. She is ready to  proceed with Zometa today. She notes her dental health is adequate. She is on Vit D and plan to start  Calcium today. She notes her pain is manageable in her mid right back. She notes Dr Mitzi Hansen does not think it is related to her MM. She notes her tailbone pain feels like it is muscular related. She ambulates with cane. She notes she has not received her Revlimid yet. She completed chemo education and is interested in proceeding with chemo.  She notes she is tolerating palliative radiation well. She notes she has been losing weight. She notes 2-3 weeks ago she started having mid right back pain She denies this being in her spine. She notes this is more muscular related. She notes she was given muscle relaxer and feels her Norco helps. With this pain it makes it harder for her to move off RT table in certain ways.     REVIEW OF SYSTEMS:   Constitutional: Denies fevers, chills or abnormal weight loss Eyes: Denies blurriness of vision Ears, nose, mouth, throat, and face: Denies mucositis or sore throat Respiratory: Denies cough, dyspnea or wheezes Cardiovascular: Denies palpitation, chest discomfort or lower extremity swelling Gastrointestinal:  Denies nausea, heartburn or change in bowel habits  Skin: Denies abnormal skin rashes MSK: (+) Improved tail bone pain (+) Manageable mid back pain, mainly right Lymphatics: Denies new lymphadenopathy or easy bruising Neurological:Denies numbness, tingling or new weaknesses Behavioral/Psych: Mood is stable, no new changes  All other systems were reviewed with the patient and are negative.  MEDICAL HISTORY:  Past Medical History:  Diagnosis Date  . Allergy   . Anemia   . Anxiety   . Arthritis   . Asthma   . Depression   . Hypertension     SURGICAL HISTORY: Past Surgical History:  Procedure Laterality Date  . CHOLECYSTECTOMY  1993  . COLONOSCOPY    . 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 TOTAL KNEE ARTHROPLASTY Right 01/28/2016   Procedure: RIGHT TOTAL KNEE ARTHROPLASTY;  Surgeon: Tarry Kos, MD;  Location: [facility];  Service: Orthopedics;   Laterality: Right;  . TRIGGER FINGER RELEASE Right 06/18/2014   Procedure: RIGHT LONG FINGER TRIGGER RELEASE;  Surgeon: Cheral Almas, MD;  Location: [facility];  Service: Orthopedics;  Laterality: Right;  . TUBAL LIGATION    . VAGINAL HYSTERECTOMY Bilateral 11/04/2016   Procedure: HYSTERECTOMY VAGINAL with Bilateral Salpingectomy;  Surgeon: Hal Morales, MD;  Location: [facility];  Service: Gynecology;  Laterality: Bilateral;  . WEIL OSTEOTOMY Right 07/07/2020   Procedure: RIGHT FOOT WEIL OSTEOTOMY 2, 3, AND 4 METATARSALS AND PROXIMAL INTERPHALANGEAL JOINT FUSION 2 & 4 TOES;  Surgeon: Nadara Mustard, MD;  Location:  SURGERY CENTER;  Service: Orthopedics;  Laterality: Right;    I have reviewed the social history and family history with the patient and they are unchanged from previous note.  ALLERGIES:  is allergic to elemental sulfur.  MEDICATIONS:  Current Outpatient Medications  Medication Sig Dispense Refill  . albuterol (VENTOLIN HFA) 108 (90 Base) MCG/ACT inhaler Inhale into the lungs.    . budesonide-formoterol (SYMBICORT) 160-4.5 MCG/ACT inhaler Inhale 2 puffs into the lungs daily as needed.    Marland Kitchen buPROPion (WELLBUTRIN XL) 150 MG 24 hr tablet 450 mg daily (150 mg + 300 mg) (Patient not taking: No sig reported) 30 tablet 0  . chlorthalidone (HYGROTON) 25 MG tablet TAKE 1 TABLET BY MOUTH EVERY DAY FOR HTN  3  . Cholecalciferol (VITAMIN D3) 1.25  MG (50000 UT) CAPS Take 1 capsule by mouth once a week.    Marland Kitchen dexamethasone (DECADRON) 4 MG tablet Take 1 tablet (4 mg total) by mouth daily. 30 tablet 0  . ferrous sulfate 325 (65 FE) MG tablet Take 325 mg by mouth 2 (two) times daily with a meal.    . HYDROcodone-acetaminophen (NORCO) 5-325 MG tablet Take 1-2 tablets by mouth every 8 (eight) hours as needed for severe pain. 30 tablet 0  . sertraline (ZOLOFT) 50 MG tablet Take 1 tablet (50 mg total) by mouth at bedtime. (Patient not taking: No sig reported) 90 tablet 0   . traZODone (DESYREL) 50 MG tablet Take 1 tablet (50 mg total) by mouth at bedtime. (Patient not taking: No sig reported) 90 tablet 0  . valACYclovir (VALTREX) 500 MG tablet Take 500 mg by mouth 2 (two) times daily.    . Vitamin D, Ergocalciferol, (DRISDOL) 50000 units CAPS capsule Take 50,000 Units by mouth every Monday.      No current facility-administered medications for this visit.    PHYSICAL EXAMINATION: ECOG PERFORMANCE STATUS: 2 - Symptomatic, <50% confined to bed  Vitals:   08/03/20 0921  BP: (!) 154/69  Pulse: 72  Resp: 17  Temp: 97.8 F (36.6 C)  SpO2: 100%   Filed Weights   08/03/20 0921  Weight: 219 lb (99.3 kg)    GENERAL:alert, no distress and comfortable SKIN: skin color, texture, turgor are normal, no rashes or significant lesions EYES: normal, Conjunctiva are pink and non-injected, sclera clear  NECK: supple, thyroid normal size, non-tender, without nodularity LYMPH:  no palpable lymphadenopathy in the cervical, axillary  LUNGS: clear to auscultation and percussion with normal breathing effort HEART: regular rate & rhythm and no murmurs and no lower extremity edema ABDOMEN:abdomen soft, non-tender and normal bowel sounds Musculoskeletal:no cyanosis of digits and no clubbing (+) Tenderness of low thoracic thoracic spine and right paraspinal are  NEURO: alert & oriented x 3 with fluent speech, no focal motor/sensory deficits  LABORATORY DATA:  I have reviewed the data as listed CBC Latest Ref Rng & Units 08/03/2020 07/30/2020 07/20/2020  WBC 4.0 - 10.5 K/uL 7.0 7.7 6.6  Hemoglobin 12.0 - 15.0 g/dL 9.7(L) 9.2(L) 9.2(L)  Hematocrit 36.0 - 46.0 % 28.6(L) 28.3(L) 28.3(L)  Platelets 150 - 400 K/uL 181 216 179     CMP Latest Ref Rng & Units 08/03/2020 07/20/2020 07/13/2020  Glucose 70 - 99 mg/dL 99 97 98  BUN 6 - 20 mg/dL 27(H) 12 17  Creatinine 0.44 - 1.00 mg/dL 0.88 0.83 0.93  Sodium 135 - 145 mmol/L 136 142 140  Potassium 3.5 - 5.1 mmol/L 3.1(L) 3.8 3.7   Chloride 98 - 111 mmol/L 97(L) 103 101  CO2 22 - 32 mmol/L $RemoveB'27 25 30  'GROsxbrO$ Calcium 8.9 - 10.3 mg/dL 9.5 8.9 9.8  Total Protein 6.5 - 8.1 g/dL 10.5(H) 9.7(H) 10.5(H)  Total Bilirubin 0.3 - 1.2 mg/dL 0.5 0.3 0.4  Alkaline Phos 38 - 126 U/L 63 68 66  AST 15 - 41 U/L 19 13(L) 14(L)  ALT 0 - 44 U/L $Remo'12 8 10      'vUUce$ RADIOGRAPHIC STUDIES: I have personally reviewed the radiological images as listed and agreed with the findings in the report. No results found.   ASSESSMENT & PLAN:  Hannah [name] is a 60 y.o. female with    1. 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 I previouslydiscussed with pathologist Dr. Gari Crown, the increased plasma cells in bone marrow is likely between 10 to 20%, and would favorsmolderingmultiple myeloma than MGUS. She initially did not meet CRAB Criteria for MM.  -After increase of IgA, light chain and M-protein (3.3) on 06/01/20 labs, multiple T1 lesions in her lumbar spine and sacrum on 07/03/20 MRI, hypermetabolic uptake in multiple bone lesion on 07/24/20 PET, she meets the diagnostic criteria for multiple myeloma -She underwent a bone marrow biopsy last week, which showed 31% plasma cells, cytology and FISH studies were sent and results are pending. I discussed with her -She is currently on palliative radiation to sacral bone lesions --I recommend induction chemotherapy with VRd (Velcade, Revlimid and dexamethasone), she is a candidate for bone marrow transplant. Chemo consent obtained, plan to start on 08/17/20. -She has had about 10 pound weight loss in recent weeks. I encouraged her to work on maintaining weight on chemo treatment.  -F/u before start of treatment.    2. Low back pain, right hip pain, Mid right back pain, secondary to MM -During work up with orthopedic surgeon,  she was found to have multiple T1 lesions of her spine and sacrum compatible with MM on her 07/03/20 MRI lumbar spine. These were hypermetabolic on 6/75/91 PET.  -For pain management she is on Norco and Dexa on 07/27/20 for pain management.  -She started Palliative radiation to pelvic lesions with Dr Lisbeth Renshaw on 07/30/20. Plan to complete on 08/12/20. Her back pain has improved on RT so far.  -She started Zometa on 08/03/20 to help strengthen her bones. I advised her to start oral calcium and Vit D -She notes mid right back pain has been there for 2-3 weeks. Based on imaging this is likely related to MM thoracic lesion. Has improved on Norco and muscle relaxer. I discussed the option of more palliative Radiation versus proceeding with chemo for disease control. I will review with Dr Lisbeth Renshaw.    3. 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  -Hemoglobin electrophoresisshowed increase in Hg C, consistent with Hg C trait. I discussed the results with her and informed her that her children are at risk ofhemoglobin C trait, and I encouraged him to be tested. -Anemia overall mild and stable. Continue oral ironBID.  4. HTN, OA, Obesity, Anxiety, depression -She will continue to f/u with PCP, Chiropractor and Ortho. -For Anxiety and depression she is under care of [place] provider. Mood stable on Zoloft   PLAN: -Proceed with Zometa today and continue monthly  -Continue Palliative RT to pelvis until 08/12/20. I sent a message to Dr. Lisbeth Renshaw to see if he can add on radiation to her thoracic lesion without prolonging her radiation course  -Lab and Velcade weekly starting 3/21, she will start Revlimid and dexa on 3/21 too  -F/u on 3/21 and every 2 weeks before injection.    No problem-specific Assessment & Plan notes found for this encounter.   No orders of the defined types were placed in this encounter.  All questions were answered.  The patient knows to call the clinic with any problems, questions or concerns. No barriers to learning was detected. The total time spent in the appointment was 30 minutes.     Truitt Merle, MD 08/03/2020   I, Joslyn Devon, am acting as scribe for Truitt Merle, MD.   I  have reviewed the above documentation for accuracy and completeness, and I agree with the above.

## 2020-08-03 ENCOUNTER — Ambulatory Visit
Admission: RE | Admit: 2020-08-03 | Discharge: 2020-08-03 | Disposition: A | Discharge status: Home / Self Care | Payer: BC Managed Care – PPO | Source: Ambulatory Visit | Attending: Radiation Oncology | Admitting: Radiation Oncology

## 2020-08-03 ENCOUNTER — Inpatient Hospital Stay (HOSPITAL_BASED_OUTPATIENT_CLINIC_OR_DEPARTMENT_OTHER): Payer: BC Managed Care – PPO | Admitting: Hematology

## 2020-08-03 ENCOUNTER — Other Ambulatory Visit: Payer: Self-pay

## 2020-08-03 ENCOUNTER — Encounter: Payer: Self-pay | Admitting: Hematology

## 2020-08-03 ENCOUNTER — Inpatient Hospital Stay: Payer: BC Managed Care – PPO

## 2020-08-03 ENCOUNTER — Inpatient Hospital Stay: Payer: BC Managed Care – PPO | Attending: Hematology

## 2020-08-03 VITALS — BP 154/69 | HR 72 | Temp 97.8°F | Resp 17 | Ht 66.0 in | Wt 219.0 lb

## 2020-08-03 DIAGNOSIS — C9000 Multiple myeloma not having achieved remission: Secondary | ICD-10-CM

## 2020-08-03 DIAGNOSIS — F419 Anxiety disorder, unspecified: Secondary | ICD-10-CM | POA: Diagnosis not present

## 2020-08-03 DIAGNOSIS — C7951 Secondary malignant neoplasm of bone: Secondary | ICD-10-CM | POA: Diagnosis not present

## 2020-08-03 DIAGNOSIS — I10 Essential (primary) hypertension: Secondary | ICD-10-CM | POA: Insufficient documentation

## 2020-08-03 DIAGNOSIS — Z510 Encounter for antineoplastic radiation therapy: Secondary | ICD-10-CM | POA: Diagnosis not present

## 2020-08-03 DIAGNOSIS — M1990 Unspecified osteoarthritis, unspecified site: Secondary | ICD-10-CM | POA: Insufficient documentation

## 2020-08-03 DIAGNOSIS — E669 Obesity, unspecified: Secondary | ICD-10-CM | POA: Diagnosis not present

## 2020-08-03 DIAGNOSIS — D472 Monoclonal gammopathy: Secondary | ICD-10-CM

## 2020-08-03 DIAGNOSIS — D509 Iron deficiency anemia, unspecified: Secondary | ICD-10-CM | POA: Diagnosis not present

## 2020-08-03 DIAGNOSIS — F32A Depression, unspecified: Secondary | ICD-10-CM | POA: Diagnosis not present

## 2020-08-03 LAB — CMP (CANCER CENTER ONLY)
ALT: 12 U/L (ref 0–44)
AST: 19 U/L (ref 15–41)
Albumin: 3.3 g/dL — ABNORMAL LOW (ref 3.5–5.0)
Alkaline Phosphatase: 63 U/L (ref 38–126)
Anion gap: 12 (ref 5–15)
BUN: 27 mg/dL — ABNORMAL HIGH (ref 6–20)
CO2: 27 mmol/L (ref 22–32)
Calcium: 9.5 mg/dL (ref 8.9–10.3)
Chloride: 97 mmol/L — ABNORMAL LOW (ref 98–111)
Creatinine: 0.88 mg/dL (ref 0.44–1.00)
GFR, Estimated: >60 mL/min (ref >60)
Glucose, Bld: 99 mg/dL (ref 70–99)
Potassium: 3.1 mmol/L — ABNORMAL LOW (ref 3.5–5.1)
Sodium: 136 mmol/L (ref 135–145)
Total Bilirubin: 0.5 mg/dL (ref 0.3–1.2)
Total Protein: 10.5 g/dL — ABNORMAL HIGH (ref 6.5–8.1)

## 2020-08-03 LAB — CBC WITH DIFFERENTIAL (CANCER CENTER ONLY)
Abs Immature Granulocytes: 0.03 10*3/uL (ref 0.00–0.07)
Basophils Absolute: 0 10*3/uL (ref 0.0–0.1)
Basophils Relative: 0 %
Eosinophils Absolute: 0 10*3/uL (ref 0.0–0.5)
Eosinophils Relative: 0 %
HCT: 28.6 % — ABNORMAL LOW (ref 36.0–46.0)
Hemoglobin: 9.7 g/dL — ABNORMAL LOW (ref 12.0–15.0)
Immature Granulocytes: 0 %
Lymphocytes Relative: 26 %
Lymphs Abs: 1.8 10*3/uL (ref 0.7–4.0)
MCH: 23.8 pg — ABNORMAL LOW (ref 26.0–34.0)
MCHC: 33.9 g/dL (ref 30.0–36.0)
MCV: 70.3 fL — ABNORMAL LOW (ref 80.0–100.0)
Monocytes Absolute: 0.4 10*3/uL (ref 0.1–1.0)
Monocytes Relative: 5 %
Neutro Abs: 4.8 10*3/uL (ref 1.7–7.7)
Neutrophils Relative %: 69 %
Platelet Count: 181 10*3/uL (ref 150–400)
RBC: 4.07 MIL/uL (ref 3.87–5.11)
RDW: 16.3 % — ABNORMAL HIGH (ref 11.5–15.5)
WBC Count: 7 10*3/uL (ref 4.0–10.5)
nRBC: 0 % (ref 0.0–0.2)

## 2020-08-03 LAB — SURGICAL PATHOLOGY

## 2020-08-03 MED ORDER — ZOLEDRONIC ACID 4 MG/100ML IV SOLN
INTRAVENOUS | Status: AC
  Filled 2020-08-03: qty 100

## 2020-08-03 MED ORDER — SODIUM CHLORIDE 0.9 % IV SOLN
Freq: Once | INTRAVENOUS | Status: AC
  Filled 2020-08-03: qty 250

## 2020-08-03 MED ORDER — ZOLEDRONIC ACID 4 MG/100ML IV SOLN
4.0000 mg | Freq: Once | INTRAVENOUS | Status: AC
  Administered 2020-08-03: 4 mg via INTRAVENOUS

## 2020-08-04 ENCOUNTER — Ambulatory Visit
Admission: RE | Admit: 2020-08-04 | Discharge: 2020-08-04 | Disposition: A | Discharge status: Home / Self Care | Payer: BC Managed Care – PPO | Source: Ambulatory Visit | Attending: Radiation Oncology | Admitting: Radiation Oncology

## 2020-08-04 ENCOUNTER — Encounter: Payer: Self-pay | Admitting: Orthopedic Surgery

## 2020-08-04 ENCOUNTER — Ambulatory Visit (INDEPENDENT_AMBULATORY_CARE_PROVIDER_SITE_OTHER): Payer: BC Managed Care – PPO | Admitting: Physician Assistant

## 2020-08-04 ENCOUNTER — Telehealth: Payer: Self-pay | Admitting: Hematology

## 2020-08-04 ENCOUNTER — Telehealth: Payer: Self-pay | Admitting: Orthopedic Surgery

## 2020-08-04 DIAGNOSIS — M205X1 Other deformities of toe(s) (acquired), right foot: Secondary | ICD-10-CM

## 2020-08-04 DIAGNOSIS — Z510 Encounter for antineoplastic radiation therapy: Secondary | ICD-10-CM | POA: Diagnosis not present

## 2020-08-04 DIAGNOSIS — C7951 Secondary malignant neoplasm of bone: Secondary | ICD-10-CM | POA: Diagnosis not present

## 2020-08-04 DIAGNOSIS — C9000 Multiple myeloma not having achieved remission: Secondary | ICD-10-CM | POA: Diagnosis not present

## 2020-08-04 NOTE — Telephone Encounter (Signed)
Scheduled appt per 3/7 LOS - spoke with pt and pt is aware of appts added on schedule.

## 2020-08-04 NOTE — Telephone Encounter (Signed)
Pt called and is wondering if she is suppose to go back to work on 08/17/20 because that is what her work note says or does he want her to stay out until seen again? [phone]

## 2020-08-04 NOTE — Telephone Encounter (Signed)
She can return to work when she feels ready.

## 2020-08-04 NOTE — Progress Notes (Signed)
Office Visit Note   Patient: Hannah [name]           Date of Birth: April 07, 1961           MRN: [id number] Visit Date: 08/04/2020              Requested by: [name], MD [address] [place],  [place] [address] PCP: Osborne Casco Fransico Him, MD  Chief Complaint  Patient presents with  . Right Foot - Routine Post Op    07/07/20 right foot weil osteotomy 2nd, 3rd and 4th MT proximal interphalangeal joint fusion 2nd and 4th toes       HPI: Patient presents today 5 weeks status post right foot Weil osteotomy second third and fourth metatarsals.  Also PIP fusion.  She is extremely happy with her result results.  She is not using the mouse pad as she found it more comfortable.  Assessment & Plan: Visit Diagnoses: No diagnosis found.  Plan: She may transition to a well supportive shoe.  Should follow-up in 4 weeks at which time final x-ray should be taken if she has any issues.  Follow-Up Instructions: No follow-ups on file.   Ortho Exam  Patient is alert, oriented, no adenopathy, well-dressed, normal affect, normal respiratory effort.Examination foot demonstrates well-healed surgical incision.  Mild soft tissue swelling but no erythema no ascending cellulitis.  Nontender to palpation.  With weightbearing no elevation of the toe well-maintained alignment  Imaging: No results found. No images are attached to the encounter.  Labs: Lab Results  Component Value Date   ESRSEDRATE 53 (H) 09/05/2017   ESRSEDRATE 43 (H) 01/15/2016   CRP 0.6 01/15/2016   LABURIC 7.2 (H) 09/05/2017     Lab Results  Component Value Date   ALBUMIN 3.3 (L) 08/03/2020   ALBUMIN 3.0 (L) 07/20/2020   ALBUMIN 3.2 (L) 07/13/2020   LABURIC 7.2 (H) 09/05/2017    No results found for: MG No results found for: VD25OH  No results found for: PREALBUMIN CBC EXTENDED Latest Ref Rng & Units 08/03/2020 07/30/2020 07/20/2020  WBC 4.0 - 10.5 K/uL 7.0 7.7 6.6  RBC 3.87 - 5.11 MIL/uL 4.07 3.90 3.93  HGB 12.0 -  15.0 g/dL 9.7(L) 9.2(L) 9.2(L)  HCT 36.0 - 46.0 % 28.6(L) 28.3(L) 28.3(L)  PLT 150 - 400 K/uL 181 216 179  NEUTROABS 1.7 - 7.7 K/uL 4.8 5.3 3.8  LYMPHSABS 0.7 - 4.0 K/uL 1.8 1.9 2.4     There is no height or weight on file to calculate BMI.  Orders:  No orders of the defined types were placed in this encounter.  No orders of the defined types were placed in this encounter.    Procedures: No procedures performed  Clinical Data: No additional findings.  ROS:  All other systems negative, except as noted in the HPI. Review of Systems  Objective: Vital Signs: LMP 10/22/2016 (Exact Date)   Specialty Comments:  No specialty comments available.  PMFS History: Patient Active Problem List   Diagnosis Date Noted  . Multiple myeloma ([place]) 07/13/2020  . Claw toe, right   . Trigger finger, left middle finger 04/09/2020  . Stenosing tenosynovitis of finger of left hand 02/27/2020  . Hemoglobin C trait ([place]) 02/27/2018  . Major depression, recurrent ([place]) 01/12/2018  . Chronic right shoulder pain 12/20/2017  . MDD (major depressive disorder), recurrent episode, moderate ([place]) 11/04/2017  . MGUS (monoclonal gammopathy of unknown significance) 10/24/2017  . Primary osteoarthritis of both hands 09/26/2017  . Primary osteoarthritis  of right shoulder 09/26/2017  . Primary osteoarthritis of both hips 09/26/2017  . Status post total knee replacement, right 09/26/2017  . Primary osteoarthritis of both feet 09/26/2017  . History of asthma 09/26/2017  . Primary osteoarthritis of left knee 04/18/2017  . Menorrhagia 11/04/2016  . Fibroid tumor 11/04/2016  . Adenomyosis 11/04/2016  . Hypertension, essential 11/04/2016  . 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 Allergy   . Anemia   . Anxiety   . Arthritis   . Asthma   . Depression   . Hypertension     Family History  Problem Relation Age of Onset  . Cancer Mother        uterine   .  Asthma Mother   . Arthritis Daughter   . Depression Maternal Uncle   . Colon cancer Neg Hx     Past Surgical History:  Procedure Laterality Date  . CHOLECYSTECTOMY  1993  . COLONOSCOPY    . 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 TOTAL KNEE ARTHROPLASTY Right 01/28/2016   Procedure: RIGHT TOTAL KNEE ARTHROPLASTY;  Surgeon: [name], MD;  Location: [facility];  Service: Orthopedics;  Laterality: Right;  . TRIGGER FINGER RELEASE Right 06/18/2014   Procedure: RIGHT LONG FINGER TRIGGER RELEASE;  Surgeon: [name], MD;  Location: [facility];  Service: Orthopedics;  Laterality: Right;  . TUBAL LIGATION    . VAGINAL HYSTERECTOMY Bilateral 11/04/2016   Procedure: HYSTERECTOMY VAGINAL with Bilateral Salpingectomy;  Surgeon: [name], MD;  Location: [facility];  Service: Gynecology;  Laterality: Bilateral;  . WEIL OSTEOTOMY Right 07/07/2020   Procedure: RIGHT FOOT WEIL OSTEOTOMY 2, 3, AND 4 METATARSALS AND PROXIMAL INTERPHALANGEAL JOINT FUSION 2 & 4 TOES;  Surgeon: Newt Minion, MD;  Location: [facility];  Service: Orthopedics;  Laterality: Right;   Social History   Occupational History  . Not on file  Tobacco Use  . Smoking status: Never Smoker  . Smokeless tobacco: Never Used  Vaping Use  . Vaping Use: Never used  Substance and Sexual Activity  . Alcohol use: No    Alcohol/week: 0.0 standard drinks  . Drug use: No  . Sexual activity: Yes    Partners: Male    Birth control/protection: Surgical

## 2020-08-04 NOTE — Telephone Encounter (Signed)
Pt was just in office this afternoon. Please see below and advise.

## 2020-08-05 ENCOUNTER — Other Ambulatory Visit: Payer: Self-pay

## 2020-08-05 ENCOUNTER — Telehealth: Payer: Self-pay | Admitting: Radiation Oncology

## 2020-08-05 ENCOUNTER — Encounter: Payer: Self-pay | Admitting: Orthopedic Surgery

## 2020-08-05 ENCOUNTER — Ambulatory Visit
Admission: RE | Admit: 2020-08-05 | Discharge: 2020-08-05 | Disposition: A | Discharge status: Home / Self Care | Payer: BC Managed Care – PPO | Source: Ambulatory Visit | Attending: Radiation Oncology | Admitting: Radiation Oncology

## 2020-08-05 ENCOUNTER — Ambulatory Visit: Payer: BC Managed Care – PPO | Attending: Orthopaedic Surgery

## 2020-08-05 DIAGNOSIS — E876 Hypokalemia: Secondary | ICD-10-CM | POA: Diagnosis not present

## 2020-08-05 DIAGNOSIS — I10 Essential (primary) hypertension: Secondary | ICD-10-CM | POA: Diagnosis not present

## 2020-08-05 DIAGNOSIS — M5442 Lumbago with sciatica, left side: Secondary | ICD-10-CM | POA: Diagnosis not present

## 2020-08-05 DIAGNOSIS — R5383 Other fatigue: Secondary | ICD-10-CM

## 2020-08-05 DIAGNOSIS — R252 Cramp and spasm: Secondary | ICD-10-CM | POA: Insufficient documentation

## 2020-08-05 DIAGNOSIS — M25652 Stiffness of left hip, not elsewhere classified: Secondary | ICD-10-CM | POA: Diagnosis not present

## 2020-08-05 DIAGNOSIS — C9000 Multiple myeloma not having achieved remission: Secondary | ICD-10-CM

## 2020-08-05 DIAGNOSIS — C7951 Secondary malignant neoplasm of bone: Secondary | ICD-10-CM | POA: Diagnosis not present

## 2020-08-05 DIAGNOSIS — R262 Difficulty in walking, not elsewhere classified: Secondary | ICD-10-CM | POA: Diagnosis not present

## 2020-08-05 DIAGNOSIS — M6281 Muscle weakness (generalized): Secondary | ICD-10-CM | POA: Insufficient documentation

## 2020-08-05 DIAGNOSIS — G8929 Other chronic pain: Secondary | ICD-10-CM | POA: Diagnosis not present

## 2020-08-05 DIAGNOSIS — Z510 Encounter for antineoplastic radiation therapy: Secondary | ICD-10-CM | POA: Diagnosis not present

## 2020-08-05 MED ORDER — LENALIDOMIDE 25 MG PO CAPS: ORAL_CAPSULE | ORAL | 0 refills | Status: DC

## 2020-08-05 NOTE — Therapy (Signed)
[place], [place], [address] Phone: [phone]   Fax:  [phone]  Physical Therapy Evaluation  Patient Details  Name: Hannah [name] MRN: [id number] Date of Birth: 01-18-1961 Referring Provider (PT): Cira Rue   Encounter Date: 08/05/2020   PT End of Session - 08/05/20 1010    Visit Number 1    Number of Visits 12    Date for PT Re-Evaluation 09/16/20    PT Start Time 0902    PT Stop Time 0955    PT Time Calculation (min) 53 min    Activity Tolerance Patient tolerated treatment well    Behavior During Therapy [facility] for tasks assessed/performed           Past Medical History:  Diagnosis Date  . Allergy   . Anemia   . Anxiety   . Arthritis   . Asthma   . Depression   . Hypertension     Past Surgical History:  Procedure Laterality Date  . CHOLECYSTECTOMY  1993  . COLONOSCOPY    . 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 TOTAL KNEE ARTHROPLASTY Right 01/28/2016   Procedure: RIGHT TOTAL KNEE ARTHROPLASTY;  Surgeon: [name], MD;  Location: [facility];  Service: Orthopedics;  Laterality: Right;  . TRIGGER FINGER RELEASE Right 06/18/2014   Procedure: RIGHT LONG FINGER TRIGGER RELEASE;  Surgeon: [name], MD;  Location: [place];  Service: Orthopedics;  Laterality: Right;  . TUBAL LIGATION    . VAGINAL HYSTERECTOMY Bilateral 11/04/2016   Procedure: HYSTERECTOMY VAGINAL with Bilateral Salpingectomy;  Surgeon: [name], MD;  Location: [facility];  Service: Gynecology;  Laterality: Bilateral;  . WEIL OSTEOTOMY Right 07/07/2020   Procedure: RIGHT FOOT WEIL OSTEOTOMY 2, 3, AND 4 METATARSALS AND PROXIMAL INTERPHALANGEAL JOINT FUSION 2 & 4 TOES;  Surgeon: Newt Minion, MD;  Location: [place];  Service: Orthopedics;  Laterality: Right;    There were no vitals filed for this visit.    Subjective Assessment - 08/05/20 0903    Subjective Was having  therapy here for back pain and did some aquatic therapy. but after a family trip with increased pelvic and LBP she saw her Dr at [facility] center.  She was having back pain and difficulty sleeping.  She had started on prednisone with no real change.  MRI performed made them see the lesions from [facility] and Dr. Burr Medico ordered the PET scan. Presently haivng radiation in left upper quadrant and pelvis.  Going to add another area for radiation to mid back.  Has recently gotten out of a post op shoe for claw toe and is using a cane for ambulation, but sometimes uses a walker especially because of pain in her back.  She doesn't have to use it as much at home now.  She has 5 more radiation sessions left after today and will start chemo regimen on 08/17/20.  Had first Zometa infusion on 08/03/20.    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 She as diagnosed in February 2022 with Multiple Myeloma not in remission. She is presently having palliative radiation for her back and pelvic pain, and has had 1 treatment of Zometa.  She will start chemotherapy on 08/17/20, She fatigues very easily.  She is presently out of work and just got out of a post op shoe for her right foot.  She is using a SPC corrected now to use with left  UE. 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 30 minutes and must move.  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 5 minutes or less impacted also by recent right foot surgery and fatigue.    How long can you walk comfortably? not much walking    Diagnostic tests MRI/PET Scan    Patient Stated Goals to stay strong and stay mobile, stay positive.    Pain Score 5     Pain Location Back    Pain Orientation Right;Left    Pain Descriptors / Indicators Tender;Sharp;Aching    Pain Type Chronic pain    Pain Radiating Towards left 3 lateral toes stay  numb most of the time, not as much radiating pain down leg now    Pain Onset More than a month ago    Pain Frequency Constant    Multiple Pain Sites Yes    Pain Score 5    Pain Location Foot    Pain Orientation Right    Pain Descriptors / Indicators Aching;Sharp    Pain Type Surgical pain    Pain Onset More than a month ago    Aggravating Factors  bending, in and out of bed, stairs, lifting, sweeping, mopping, dressing    Pain Relieving Factors hydrocodone, radiation    Effect of Pain on Daily Activities limits household chores sweeping, mopping,  energy to work              [facility] PT Assessment - 08/05/20 0001      Assessment   Medical Diagnosis Multiple Myeloma    Referring Provider (PT) Cira Rue    Hand Dominance Right    Prior Therapy yes      Precautions   Precaution Comments multiple myeloma      Restrictions   Weight Bearing Restrictions No      Balance Screen   Has the patient fallen in the past 6 months No    Has the patient had a decrease in activity level because of a fear of falling?  No    Is the patient reluctant to leave their home because of a fear of falling?  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     Posture/Postural Control   Posture/Postural Control Postural limitations    Postural Limitations Rounded Shoulders;Forward head;Flexed trunk      Transfers   Five time sit to stand comments  55 seconds with some use of hands decreased wt on right post surgical foot      Ambulation/Gait   Assistive device  Straight cane    Gait Pattern Step-through pattern;Decreased step length - left;Decreased stance time - right;Decreased weight shift to right   was using cane in wrong hand.  When corrected to left hand secondary to post op right foot surgery,greatly improved gait   Ambulation Surface Level    Gait velocity slow    Gait Comments TUG test with SPC 33 sec      Balance   Balance Assessed Yes    Balance comment 4 position balance test;   feet together WNL, semi tandem WNL, tandem 3 sec, SLS on left 2 seconds.  No pain but very fatigued     Timed Up and Go Test   Normal TUG (seconds) 33   with SPC     Functional Gait  Assessment   Gait assessed  No   ambulating with much more even WB with SPC in left hand and improved step length and balance            LYMPHEDEMA/ONCOLOGY QUESTIONNAIRE - 08/05/20 0001      Treatment   Active Chemotherapy Treatment --   starts on 08/17/20   Past Chemotherapy Treatment No    Active Radiation Treatment Yes    Past Radiation Treatment No    Current Hormone Treatment No    Past Hormone Therapy No      What other symptoms do you have   Are you Having Heaviness or Tightness No    Are you having Pain Yes   back, pelvis, right foot   Do you have infections No                   Objective measurements completed on examination: See above findings.                 PT Short Term Goals - 05/19/20 1432      PT SHORT TERM GOAL #1   Title STG=LTG             PT Long Term Goals - 08/05/20 1208      PT LONG TERM GOAL #1   Title Pt will be independent with all HEP given    Baseline No knowledge    Time 6    Period Weeks    Status New    Target Date 09/16/20      PT LONG TERM GOAL #2   Title Pt will be able to perform household chores without exacerbating back pain and using pain managment techniques    Baseline Pt unable to sweep or wash dishes for longer than 10 minutes    Period Weeks    Status New    Target Date  09/16/20      PT LONG TERM GOAL #3   Title Pt will be able to perform a 5 x sts in 20 sec or less showing improved LE strength and control without pain    Baseline 55 sec baseline    Time 6    Period Weeks    Status New    Target Date 09/16/20      PT LONG TERM GOAL #4   Title Pt will improve TUG time to 13 seconds or less to decrease risk of falls    Baseline 55 sec at eval    Time 6    Period Weeks    Status New    Target Date 09/16/20      PT LONG TERM GOAL #5   Title Pt will be able to hold each position of 4 position balance test for atleast 10 seconds without LOB    Baseline unable to hold tandem or SLS (right SLS not tested today secondary  to recent surgery)    Time 6    Period Weeks    Status New    Target Date 09/16/20                  Plan - 08/05/20 1012    Clinical Impression Statement pt is seen today with new diagnosis of Multiple Myeloma.  She is presently undergoing radiation for palliative back and pelvic pain and has recently started on Zometa.  She will start chemotherapy on March 21,2020.  She is just recovering from right foot surgery and is using a SPC with improved gait pattern after switching her cane to the left UE.  She fatigues very easily.  She requires use of arms on chair to stand, and TUG test was 33 seconds.  It took her 55 seconds with use of arms to perform 5 sit to stands.  She also did poorly on 4 position balance test.  All of these tests are concerning for risk of falls.  Her goals are to stay strong, positive and mobile. She will benefit from skilled PT to determine and encourage a safe exercise program for strength, stability, endurance and balance.    Personal Factors and Comorbidities Comorbidity 3+    Comorbidities Multiple Myeloma, multi joint OA, recent foot surgery, fall risk    Examination-Activity Limitations Sleep;Squat;Stand;Stairs;Transfers;Locomotion Level;Sit    Examination-Participation Restrictions Cleaning;Community  Activity;Occupation;Shop    Stability/Clinical Decision Making Evolving/Moderate complexity    Clinical Decision Making Moderate    Rehab Potential Good    PT Frequency 2x / week    PT Duration 6 weeks    PT Treatment/Interventions ADLs/Self Care Home Management;Aquatic Therapy;Iontophoresis 20m/ml Dexamethasone;Gait training;Stair training;Functional mobility training;Therapeutic activities;Therapeutic exercise;Balance training;Neuromuscular re-education;Manual techniques;Patient/family education;Passive range of motion;Joint Manipulations    PT Next Visit Plan start slowly with generalized strength, balance, mobility, core strength.  Mindful of radiation for back pain/pelvic pain, fatigues easily.    Recommended Other Services ?possibility of pool therapy    Consulted and Agree with Plan of Care Patient           Patient will benefit from skilled therapeutic intervention in order to improve the following deficits and impairments:  Abnormal gait,Decreased mobility,Decreased activity tolerance,Decreased endurance,Decreased strength,Decreased balance,Decreased knowledge of precautions,Difficulty walking,Postural dysfunction,Pain  Visit Diagnosis: Chronic low back pain with left-sided sciatica, unspecified back pain laterality  Muscle weakness (generalized)  Fatigue, unspecified type  Difficulty in walking, not elsewhere classified  Multiple myeloma not having achieved remission ([facility]     Problem List Patient Active Problem List   Diagnosis Date Noted  . Multiple myeloma ([facility] 07/13/2020  . Claw toe, right   . Trigger finger, left middle finger 04/09/2020  . Stenosing tenosynovitis of finger of left hand 02/27/2020  . Hemoglobin C trait ([facility] 02/27/2018  . Major depression, recurrent ([facility] 01/12/2018  . Chronic right shoulder pain 12/20/2017  . MDD (major depressive disorder), recurrent episode, moderate ([facility] 11/04/2017  . MGUS (monoclonal gammopathy of unknown significance)  10/24/2017  . Primary osteoarthritis of both hands 09/26/2017  . Primary osteoarthritis of right shoulder 09/26/2017  . Primary osteoarthritis of both hips 09/26/2017  . Status post total knee replacement, right 09/26/2017  . Primary osteoarthritis of both feet 09/26/2017  . History of asthma 09/26/2017  . Primary osteoarthritis of left knee 04/18/2017  . Menorrhagia 11/04/2016  . Fibroid tumor 11/04/2016  . Adenomyosis 11/04/2016  . Hypertension, essential 11/04/2016  . Total knee replacement status 01/28/2016    RElsie RaWJam[facility] Center3/01/2021, 12:18  PM  [place], [place], [address] Phone: [phone]   Fax:  [phone]  Name: EVALINE WALTMAN MRN: [id number] Date of Birth: 22-Dec-1960 Cheral Almas, PT 08/05/20 12:18 PM

## 2020-08-05 NOTE — Telephone Encounter (Signed)
Dr. Burr Medico contacted [place] yesterday to let us know that the patient was having progressive pain in the mid to lateral section of the trunk which she felt was most likely consistent with her disease in the thoracic spine.  We did review that we had wired the location but there was no CT correlate, the patient is contacted today after Dr. Lisbeth Renshaw and I reviewed her films and he has offered additional isocenters for treatment including the mid thoracic spine just around the level that was wired posteriorly, as well as a few vertebrae above as one separate isocenter and then the base of the cervical spine is there is a activity on prior PET in the posterior spinous process.  The goals of treatment would be to try and alleviate her pain but also try and prevent further destruction that could subsequently lead to pain prior to her beginning any systemic therapy.  We reviewed the risks, benefits as well as the possibility that this may not significantly improve her symptoms if her symptoms were not truly related to the sites.  She is in agreement to try and understands the risks involved.  She will come in this afternoon for her normal schedule treatment at which time we will update her consent form to include thoracic and cervical spine.  She is in agreement with this plan I will make sure Dr. Burr Medico is aware as well.

## 2020-08-05 NOTE — Telephone Encounter (Signed)
Responded to pt in mychart message.

## 2020-08-06 ENCOUNTER — Ambulatory Visit: Payer: BC Managed Care – PPO | Admitting: Radiation Oncology

## 2020-08-06 ENCOUNTER — Ambulatory Visit
Admission: RE | Admit: 2020-08-06 | Discharge: 2020-08-06 | Disposition: A | Discharge status: Home / Self Care | Payer: BC Managed Care – PPO | Source: Ambulatory Visit | Attending: Radiation Oncology | Admitting: Radiation Oncology

## 2020-08-06 ENCOUNTER — Telehealth: Payer: Self-pay | Admitting: Hematology

## 2020-08-06 ENCOUNTER — Other Ambulatory Visit: Payer: Self-pay

## 2020-08-06 DIAGNOSIS — C9000 Multiple myeloma not having achieved remission: Secondary | ICD-10-CM | POA: Diagnosis not present

## 2020-08-06 DIAGNOSIS — Z510 Encounter for antineoplastic radiation therapy: Secondary | ICD-10-CM | POA: Diagnosis not present

## 2020-08-06 DIAGNOSIS — C7951 Secondary malignant neoplasm of bone: Secondary | ICD-10-CM | POA: Diagnosis not present

## 2020-08-06 NOTE — Telephone Encounter (Signed)
Oral Oncology Pharmacist Encounter  Received new prescription for Revlimid (lenalidomide) for the treatment of multiple myeloma in conjunction with Velcade and dexamethasone, planned for duration of induction therapy pending BMT.  Prescription dose and frequency assessed for appropriateness. Appropriate for therapy initiation. Planned start date of 08/17/20.  CBC w/ Diff and CMP from 08/03/20 assessed, no renal dose adjustments required for Revlimid.  Current medication list in Epic reviewed, no relevant/significant DDIs with Revlimid identified.  Evaluated chart and no patient barriers to medication adherence noted.   Patient agreement for Revlimid treatment documented in MD note on 08/03/20.  Patient required to fill Revlimid through [facility] due to insurance restrictions and Revlimid being a limited distribution medication.   Oral Oncology Clinic will continue to follow for insurance authorization, copayment issues, initial counseling and start date.  Leron Croak, PharmD, BCPS Hematology/Oncology Clinical Pharmacist [facility] [phone] 08/06/2020 7:40 AM

## 2020-08-06 NOTE — Telephone Encounter (Signed)
Called to confirm appts per 3/7 los - pt is aware of 3/21 appts.

## 2020-08-07 ENCOUNTER — Ambulatory Visit
Admission: RE | Admit: 2020-08-07 | Discharge: 2020-08-07 | Disposition: A | Discharge status: Home / Self Care | Payer: BC Managed Care – PPO | Source: Ambulatory Visit | Attending: Radiation Oncology | Admitting: Radiation Oncology

## 2020-08-07 ENCOUNTER — Ambulatory Visit: Payer: BC Managed Care – PPO

## 2020-08-07 ENCOUNTER — Other Ambulatory Visit: Payer: BC Managed Care – PPO

## 2020-08-07 ENCOUNTER — Other Ambulatory Visit: Payer: Self-pay

## 2020-08-07 DIAGNOSIS — C9000 Multiple myeloma not having achieved remission: Secondary | ICD-10-CM | POA: Diagnosis not present

## 2020-08-07 DIAGNOSIS — C7951 Secondary malignant neoplasm of bone: Secondary | ICD-10-CM | POA: Diagnosis not present

## 2020-08-07 DIAGNOSIS — Z510 Encounter for antineoplastic radiation therapy: Secondary | ICD-10-CM | POA: Diagnosis not present

## 2020-08-10 ENCOUNTER — Other Ambulatory Visit: Payer: Self-pay | Admitting: Nurse Practitioner

## 2020-08-10 ENCOUNTER — Ambulatory Visit: Payer: BC Managed Care – PPO

## 2020-08-10 ENCOUNTER — Ambulatory Visit: Payer: BC Managed Care – PPO | Admitting: Physical Therapy

## 2020-08-10 ENCOUNTER — Other Ambulatory Visit: Payer: Self-pay

## 2020-08-10 ENCOUNTER — Encounter: Payer: Self-pay | Admitting: Physical Therapy

## 2020-08-10 ENCOUNTER — Ambulatory Visit
Admission: RE | Admit: 2020-08-10 | Discharge: 2020-08-10 | Disposition: A | Discharge status: Home / Self Care | Payer: BC Managed Care – PPO | Source: Ambulatory Visit | Attending: Radiation Oncology | Admitting: Radiation Oncology

## 2020-08-10 DIAGNOSIS — R252 Cramp and spasm: Secondary | ICD-10-CM | POA: Diagnosis not present

## 2020-08-10 DIAGNOSIS — C9000 Multiple myeloma not having achieved remission: Secondary | ICD-10-CM | POA: Diagnosis not present

## 2020-08-10 DIAGNOSIS — R5383 Other fatigue: Secondary | ICD-10-CM | POA: Diagnosis not present

## 2020-08-10 DIAGNOSIS — Z510 Encounter for antineoplastic radiation therapy: Secondary | ICD-10-CM | POA: Diagnosis not present

## 2020-08-10 DIAGNOSIS — M25652 Stiffness of left hip, not elsewhere classified: Secondary | ICD-10-CM | POA: Diagnosis not present

## 2020-08-10 DIAGNOSIS — R262 Difficulty in walking, not elsewhere classified: Secondary | ICD-10-CM

## 2020-08-10 DIAGNOSIS — M6281 Muscle weakness (generalized): Secondary | ICD-10-CM | POA: Diagnosis not present

## 2020-08-10 DIAGNOSIS — M5442 Lumbago with sciatica, left side: Secondary | ICD-10-CM

## 2020-08-10 DIAGNOSIS — G8929 Other chronic pain: Secondary | ICD-10-CM

## 2020-08-10 DIAGNOSIS — C7951 Secondary malignant neoplasm of bone: Secondary | ICD-10-CM | POA: Diagnosis not present

## 2020-08-10 NOTE — Therapy (Signed)
[place], [place], [address] Phone: [phone]   Fax:  [phone]  Physical Therapy Treatment  Patient Details  Name: Hannah [name] MRN: [id number] Date of Birth: 08-10-1960 Referring Provider (PT): Cira Rue   Encounter Date: 08/10/2020   PT End of Session - 08/10/20 0904    Visit Number 2    Number of Visits 12    Date for PT Re-Evaluation 09/16/20    Authorization Type BCBS    PT Start Time 0808    PT Stop Time 0900    PT Time Calculation (min) 52 min    Activity Tolerance Patient tolerated treatment well    Behavior During Therapy [facility] for tasks assessed/performed           Past Medical History:  Diagnosis Date  . Allergy   . Anemia   . Anxiety   . Arthritis   . Asthma   . Depression   . Hypertension     Past Surgical History:  Procedure Laterality Date  . CHOLECYSTECTOMY  1993  . COLONOSCOPY    . 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 TOTAL KNEE ARTHROPLASTY Right 01/28/2016   Procedure: RIGHT TOTAL KNEE ARTHROPLASTY;  Surgeon: [name], MD;  Location: [place];  Service: Orthopedics;  Laterality: Right;  . TRIGGER FINGER RELEASE Right 06/18/2014   Procedure: RIGHT LONG FINGER TRIGGER RELEASE;  Surgeon: [name], MD;  Location: [facility];  Service: Orthopedics;  Laterality: Right;  . TUBAL LIGATION    . VAGINAL HYSTERECTOMY Bilateral 11/04/2016   Procedure: HYSTERECTOMY VAGINAL with Bilateral Salpingectomy;  Surgeon: [name], MD;  Location: [facility];  Service: Gynecology;  Laterality: Bilateral;  . WEIL OSTEOTOMY Right 07/07/2020   Procedure: RIGHT FOOT WEIL OSTEOTOMY 2, 3, AND 4 METATARSALS AND PROXIMAL INTERPHALANGEAL JOINT FUSION 2 & 4 TOES;  Surgeon: Newt Minion, MD;  Location: [place];  Service: Orthopedics;  Laterality: Right;    There were no vitals filed for this visit.   Subjective Assessment - 08/10/20 0809     Subjective I am stiff this morning. Just hard to get up from a low chair.    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 She as diagnosed in February 2022 with Multiple Myeloma not in remission. She is presently having palliative radiation for her back and pelvic pain, and has had 1 treatment of Zometa.  She will start chemotherapy on 08/17/20, She fatigues very easily.  She is presently out of work and just got out of a post op shoe for her right foot.  She is using a SPC corrected now to use with left UE. 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    Currently in Pain? Yes    Pain Score 5     Pain Location Pelvis    Pain Orientation Right;Left    Pain Descriptors / Indicators Aching;Dull    Pain Type Chronic pain    Pain Onset More than a month ago    Pain Frequency Constant    Aggravating Factors  movement    Pain Relieving Factors heat    Effect of Pain on Daily Activities hard to move around    Multiple Pain Sites Yes    Pain Score 5    Pain Location Back    Pain  Orientation Right;Left    Pain Descriptors / Indicators Sore    Pain Type Chronic pain    Pain Onset More than a month ago    Pain Frequency Constant    Aggravating Factors  movement    Pain Relieving Factors heat    Effect of Pain on Daily Activities hard to move, getting up from the chair and walking                             [facility] Adult PT Treatment/Exercise - 08/10/20 0001      Bed Mobility   Bed Mobility Rolling Left;Left Sidelying to Sit    Rolling Left Independent with assistive device   Instructed pt in correct technique to get out of bed by rolling to L side, then putting legs over edge of mat while pushing through R elbow to sit- pt reports she normally gets off on R side- she was able to complete with v/c and contact guard for t/c   Left Sidelying to Sit  Contact Guard/Touching assist   see note under rolling left     Lumbar Exercises: Stretches   Other Lumbar Stretch Exercise Meeks decompression exercises - all x 5 with 3 sec holds      Lumbar Exercises: Supine   Pelvic Tilt 10 reps    Pelvic Tilt Limitations verbal instruction to coordinate with diaphragmtic breathing - breathe in allow belly to expand then breathe out and flatten back in to mat and tighten stomach    Other Supine Lumbar Exercises Pelvic tilts x 10 reps coordinating diaphragmatic breaths - exhale with tilt, inhale while letting it go; pelvic tilt with mini march x 5 reps x 2 sets - had to rest after 5 reps due to muscle fatigue                  PT Education - 08/10/20 0903    Education Details bed mobility- left rolling to sitting    Person(s) Educated Patient    Methods Explanation    Comprehension Verbalized understanding;Tactile cues required;Verbal cues required;Returned demonstration            PT Short Term Goals - 05/19/20 1432      PT SHORT TERM GOAL #1   Title STG=LTG             PT Long Term Goals - 08/05/20 1208      PT LONG TERM GOAL #1   Title Pt will be independent with all HEP given    Baseline No knowledge    Time 6    Period Weeks    Status New    Target Date 09/16/20      PT LONG TERM GOAL #2   Title Pt will be able to perform household chores without exacerbating back pain and using pain managment techniques    Baseline Pt unable to sweep or wash dishes for longer than 10 minutes    Period Weeks    Status New    Target Date 09/16/20      PT LONG TERM GOAL #3   Title Pt will be able to perform a 5 x sts in 20 sec or less showing improved LE strength and control without pain    Baseline 55 sec baseline    Time 6    Period Weeks    Status New    Target Date 09/16/20      PT LONG TERM GOAL #  4   Title Pt will improve TUG time to 13 seconds or less to decrease risk of falls    Baseline 55 sec at eval    Time 6     Period Weeks    Status New    Target Date 09/16/20      PT LONG TERM GOAL #5   Title Pt will be able to hold each position of 4 position balance test for atleast 10 seconds without LOB    Baseline unable to hold tandem or SLS (right SLS not tested today secondary to recent surgery)    Time 6    Period Weeks    Status New    Target Date 09/16/20                 Plan - 08/10/20 1201    Clinical Impression Statement Began instructing pt in pelvic stabilization exercises including pelvic tilts and pelvic tilts with mini marches. Educated pt in importance of coordinating diaphragmatic breathing with pelvic tilt. Also instructed pt in Meeks decompression exercises to help decrease back pain and issued all of these as part of pt's HEP. Pt requires recovery periods due to muscle fatigue and when her muscles fatigue she looses proper form of exercises. Will continue to instruct in supine exercises including core at next session.    PT Frequency 2x / week    PT Duration 6 weeks    PT Treatment/Interventions ADLs/Self Care Home Management;Aquatic Therapy;Iontophoresis 45m/ml Dexamethasone;Gait training;Stair training;Functional mobility training;Therapeutic activities;Therapeutic exercise;Balance training;Neuromuscular re-education;Manual techniques;Patient/family education;Passive range of motion;Joint Manipulations    PT Next Visit Plan start slowly with generalized strength, balance, mobility, core strength.  Mindful of radiation for back pain/pelvic pain, fatigues easily.    Consulted and Agree with Plan of Care Patient           Patient will benefit from skilled therapeutic intervention in order to improve the following deficits and impairments:  Abnormal gait,Decreased mobility,Decreased activity tolerance,Decreased endurance,Decreased strength,Decreased balance,Decreased knowledge of precautions,Difficulty walking,Postural dysfunction,Pain  Visit Diagnosis: Muscle weakness  (generalized)  Chronic low back pain with left-sided sciatica, unspecified back pain laterality  Fatigue, unspecified type  Difficulty in walking, not elsewhere classified  Multiple myeloma not having achieved remission ([facility]     Problem List Patient Active Problem List   Diagnosis Date Noted  . [place]oma ([place] 07/13/2020  . Claw toe, right   . Trigger finger, left middle finger 04/09/2020  . Stenosing tenosynovitis of finger of left hand 02/27/2020  . Hemoglobin C trait ([place] 02/27/2018  . Major depression, recurrent ([place] 01/12/2018  . Chronic right shoulder pain 12/20/2017  . MDD (major depressive disorder), recurrent episode, moderate ([place] 11/04/2017  . MGUS (monoclonal gammopathy of unknown significance) 10/24/2017  . Primary osteoarthritis of both hands 09/26/2017  . Primary osteoarthritis of right shoulder 09/26/2017  . Primary osteoarthritis of both hips 09/26/2017  . Status post total knee replacement, right 09/26/2017  . Primary osteoarthritis of both feet 09/26/2017  . History of asthma 09/26/2017  . Primary osteoarthritis of left knee 04/18/2017  . Menorrhagia 11/04/2016  . Fibroid tumor 11/04/2016  . Adenomyosis 11/04/2016  . Hypertension, essential 11/04/2016  . Total knee replacement status 01/28/2016    Hannah SabalBAscension St Joseph Hospital3/14/2022, 12:03 PM  [place] [address]Phone: [phone]  Fax:  [phone] Name: JDUTCHESS CROSLANDMRN: [id number] of Birth: 02/07/1961-05-08 BManus Gunning PT 08/10/20 12:03 PM

## 2020-08-10 NOTE — Telephone Encounter (Signed)
Oral Chemotherapy Pharmacist Encounter   Spoke with patient today to follow up regarding patient's oral chemotherapy medication: Revlimid (lenalidomide)  Patient has already set up shipment with [facility] for first fill of Revlimid. This will be delivered to patient's home this week, patient stated she had a $0 copay.   Patient knows she will not start Revlimid until 08/17/20 PM. Will provide initial oral chemotherapy education when patient is in clinic on 08/17/20.   Patient knows to call the office with questions or concerns.  Leron Croak, PharmD, BCPS Hematology/Oncology Clinical Pharmacist [facility] [phone] 08/10/2020 11:21 AM

## 2020-08-10 NOTE — Patient Instructions (Signed)
1. Decompression Exercise     Cancer Rehab [phone]    Lie on back on firm surface, knees bent, feet flat, arms turned up, out to sides, backs of hands down. Time _5-15__ minutes. Surface: floor   2. Shoulder Press    Start in Decompression Exercise position. Press shoulders downward towards supporting surface. Hold __2-3__ seconds while counting out loud. Repeat _3-5___ times. Do _1-2___ times per day.   3. Head Press    Bring cervical spine (neck) into neutral position (by either tucking the chin towards the chest or tilting the chin upward). Feel weight on back of head. Press head downward into supporting surface.    Hold _2-3__ seconds. Repeat _3-5__ times. Do _1-2__ times per day.   4. Leg Lengthener    Straighten one leg. Pull toes AND forefoot toward knee, extend heel. Lengthen leg by pulling pelvis away from ribs. Hold _2-3__ seconds. Relax. Repeat __4-6__ times. Do other leg.  Surface: floor   5. Leg Press    Straighten one leg down to floor keeping leg aligned with hip. Pull toes AND forefoot toward knee; extend heel.  Press entire leg downward (as if pressing leg into sandy beach). DO NOT BEND KNEE. Hold _2-3__ seconds. Do __4-6__ times. Repeat with other leg.    Pelvic Tilt    Breathe in to your diaphragm and allow your stomach to expand then breathe out while flattening back by tightening stomach muscles and buttocks. Repeat _10___ times per set. Do __1__ sets per session. Do _2___ sessions per day.  http://orth.exer.us/135   Copyright  VHI. All rights reserved.   Then do pelvic tilt with mini march: Breathe in to your diaphragm and allow your stomach to expand- then breathe out while gently pressing low back in to supporting surface and tightening stomach. Only lift 1 foot at a time and only raise it a small amount so you do not loose your pelvic tilt. Alternate sides.  Repeat 5 times on each side then rest. Then do another 5 reps. Do 1 set per session.  Do 2 sessions per day.

## 2020-08-11 ENCOUNTER — Encounter (HOSPITAL_COMMUNITY): Payer: Self-pay | Admitting: Hematology

## 2020-08-11 ENCOUNTER — Other Ambulatory Visit: Payer: Self-pay | Admitting: Hematology

## 2020-08-11 ENCOUNTER — Ambulatory Visit
Admission: RE | Admit: 2020-08-11 | Discharge: 2020-08-11 | Disposition: A | Discharge status: Home / Self Care | Payer: BC Managed Care – PPO | Source: Ambulatory Visit | Attending: Radiation Oncology | Admitting: Radiation Oncology

## 2020-08-11 ENCOUNTER — Other Ambulatory Visit: Payer: Self-pay

## 2020-08-11 ENCOUNTER — Ambulatory Visit: Payer: BC Managed Care – PPO

## 2020-08-11 DIAGNOSIS — C9000 Multiple myeloma not having achieved remission: Secondary | ICD-10-CM

## 2020-08-11 DIAGNOSIS — Z510 Encounter for antineoplastic radiation therapy: Secondary | ICD-10-CM | POA: Diagnosis not present

## 2020-08-11 DIAGNOSIS — C7951 Secondary malignant neoplasm of bone: Secondary | ICD-10-CM | POA: Diagnosis not present

## 2020-08-11 MED ORDER — HYDROCODONE-ACETAMINOPHEN 5-325 MG PO TABS: 1.0000 | ORAL_TABLET | Freq: Three times a day (TID) | ORAL | 0 refills | Status: DC | PRN

## 2020-08-11 NOTE — Telephone Encounter (Signed)
Ignor this I cant get it off of my list

## 2020-08-12 ENCOUNTER — Ambulatory Visit: Payer: BC Managed Care – PPO

## 2020-08-12 ENCOUNTER — Other Ambulatory Visit: Payer: Self-pay

## 2020-08-12 ENCOUNTER — Encounter: Payer: Self-pay | Admitting: Radiation Oncology

## 2020-08-12 ENCOUNTER — Ambulatory Visit
Admission: RE | Admit: 2020-08-12 | Discharge: 2020-08-12 | Disposition: A | Discharge status: Home / Self Care | Payer: BC Managed Care – PPO | Source: Ambulatory Visit | Attending: Radiation Oncology | Admitting: Radiation Oncology

## 2020-08-12 DIAGNOSIS — C7951 Secondary malignant neoplasm of bone: Secondary | ICD-10-CM | POA: Diagnosis not present

## 2020-08-12 DIAGNOSIS — Z1339 Encounter for screening examination for other mental health and behavioral disorders: Secondary | ICD-10-CM | POA: Diagnosis not present

## 2020-08-12 DIAGNOSIS — R82998 Other abnormal findings in urine: Secondary | ICD-10-CM | POA: Diagnosis not present

## 2020-08-12 DIAGNOSIS — C9000 Multiple myeloma not having achieved remission: Secondary | ICD-10-CM | POA: Diagnosis not present

## 2020-08-12 DIAGNOSIS — Z1331 Encounter for screening for depression: Secondary | ICD-10-CM | POA: Diagnosis not present

## 2020-08-12 DIAGNOSIS — J4540 Moderate persistent asthma, uncomplicated: Secondary | ICD-10-CM | POA: Diagnosis not present

## 2020-08-12 DIAGNOSIS — Z510 Encounter for antineoplastic radiation therapy: Secondary | ICD-10-CM | POA: Diagnosis not present

## 2020-08-12 DIAGNOSIS — Z0000 Encounter for general adult medical examination without abnormal findings: Secondary | ICD-10-CM | POA: Diagnosis not present

## 2020-08-12 DIAGNOSIS — I10 Essential (primary) hypertension: Secondary | ICD-10-CM | POA: Diagnosis not present

## 2020-08-13 ENCOUNTER — Ambulatory Visit: Payer: BC Managed Care – PPO

## 2020-08-14 ENCOUNTER — Ambulatory Visit: Payer: BC Managed Care – PPO | Admitting: Hematology

## 2020-08-14 ENCOUNTER — Other Ambulatory Visit: Payer: Self-pay

## 2020-08-14 ENCOUNTER — Ambulatory Visit: Payer: BC Managed Care – PPO

## 2020-08-14 DIAGNOSIS — M5442 Lumbago with sciatica, left side: Secondary | ICD-10-CM

## 2020-08-14 DIAGNOSIS — C9000 Multiple myeloma not having achieved remission: Secondary | ICD-10-CM | POA: Diagnosis not present

## 2020-08-14 DIAGNOSIS — R262 Difficulty in walking, not elsewhere classified: Secondary | ICD-10-CM

## 2020-08-14 DIAGNOSIS — G8929 Other chronic pain: Secondary | ICD-10-CM

## 2020-08-14 DIAGNOSIS — M6281 Muscle weakness (generalized): Secondary | ICD-10-CM

## 2020-08-14 DIAGNOSIS — R252 Cramp and spasm: Secondary | ICD-10-CM | POA: Diagnosis not present

## 2020-08-14 DIAGNOSIS — R5383 Other fatigue: Secondary | ICD-10-CM

## 2020-08-14 DIAGNOSIS — M25652 Stiffness of left hip, not elsewhere classified: Secondary | ICD-10-CM | POA: Diagnosis not present

## 2020-08-14 NOTE — Therapy (Signed)
[facility] [address] [place], [place], [address] Phone: [phone]   Fax:  [phone]  Physical Therapy Treatment  Patient Details  Name: Hannah [name] MRN: [id number] Date of Birth: [birth year] Referring Provider (PT): Santiago Glad   Encounter Date: 08/14/2020   PT End of Session - 08/14/20 0814    Visit Number 3    Number of Visits 12    Date for PT Re-Evaluation 09/16/20    Authorization Type BCBS    PT Start Time 0802    PT Stop Time 0850    PT Time Calculation (min) 48 min    Activity Tolerance Patient tolerated treatment well    Behavior During Therapy [facility] for tasks assessed/performed           Past Medical History:  Diagnosis Date  . Allergy   . Anemia   . Anxiety   . Arthritis   . Asthma   . Depression   . Hypertension     Past Surgical History:  Procedure Laterality Date  . CHOLECYSTECTOMY  1993  . COLONOSCOPY    . 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 TOTAL KNEE ARTHROPLASTY Right 01/28/2016   Procedure: RIGHT TOTAL KNEE ARTHROPLASTY;  Surgeon: Tarry Kos, MD;  Location: [facility];  Service: Orthopedics;  Laterality: Right;  . TRIGGER FINGER RELEASE Right 06/18/2014   Procedure: RIGHT LONG FINGER TRIGGER RELEASE;  Surgeon: Cheral Almas, MD;  Location: [facility];  Service: Orthopedics;  Laterality: Right;  . TUBAL LIGATION    . VAGINAL HYSTERECTOMY Bilateral 11/04/2016   Procedure: HYSTERECTOMY VAGINAL with Bilateral Salpingectomy;  Surgeon: Hal Morales, MD;  Location: [facility];  Service: Gynecology;  Laterality: Bilateral;  . WEIL OSTEOTOMY Right 07/07/2020   Procedure: RIGHT FOOT WEIL OSTEOTOMY 2, 3, AND 4 METATARSALS AND PROXIMAL INTERPHALANGEAL JOINT FUSION 2 & 4 TOES;  Surgeon: Nadara Mustard, MD;  Location: [facility];  Service: Orthopedics;  Laterality: Right;    There were no vitals filed for this visit.   Subjective Assessment - 08/14/20 0759     Subjective Had my first nausea vomiting episode this am but I feel OK now.  I did a little cleaning and I think I overdid it.  Not using cane today because I feel pretty good.  Had my last radiation on Wednesday.  Pain is much more manageable.  Have a little pain under right rib cage. Can get in/out of bed much better now.  Exercises last week felt good. Have been doing the exs in bed and in the bathtub. Not taking hydrocodone now and walking without cane today.    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 She as diagnosed in February 2022 with Multiple Myeloma not in remission. She is presently having palliative radiation for her back and pelvic pain, and has had 1 treatment of Zometa.  She will start chemotherapy on 08/17/20, She fatigues very easily.  She is presently out of work and just got out of a post op shoe for her right foot.  She is using a SPC corrected now to use with left UE. 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 30 minutes and must move.  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 5 minutes or less impacted also by recent  right foot surgery and fatigue.    Currently in Pain? Yes    Pain Score 2     Pain Location Back    Pain Orientation Right;Left    Pain Descriptors / Indicators Aching    Pain Onset More than a month ago    Pain Frequency Constant    Multiple Pain Sites Yes    Pain Score 5    Pain Location Rib cage    Pain Orientation Right    Pain Descriptors / Indicators Sharp    Pain Type Chronic pain    Pain Onset More than a month ago    Pain Frequency Constant    Aggravating Factors  coughing, moving certain ways, getting out of bed    Pain Relieving Factors heat                             [facility] Adult PT Treatment/Exercise - 08/14/20 0001      Lumbar Exercises: Stretches   Other  Lumbar Stretch Exercise Meeks decompression exercises - all x 5 with 3 sec holds      Lumbar Exercises: Seated   Sit to Stand 5 reps   2 sets high mat table   Sit to Stand Limitations without UE, cueing to prevent IR at knees      Lumbar Exercises: Supine   Ab Set 5 reps   with ppt and breathing   Pelvic Tilt 10 reps    Pelvic Tilt Limitations verbal instruction to coordinate with diaphragmtic breathing - breathe in allow belly to expand then breathe out and flatten back in to mat and tighten stomach    Glut Set 10 reps;3 seconds    Heel Slides 5 reps   bilaterally.  holding tilt then sliding leg   Large Ball Oblique Isometric Limitations Ppt with bent knee fall out x5 B    Other Supine Lumbar Exercises pelvic tilt with SL heel slide x 5 ea   Other Supine Lumbar Exercises ppt with ball squeeze 2 x 5 3 sec hold                   PT Short Term Goals - 05/19/20 1432      PT SHORT TERM GOAL #1   Title STG=LTG             PT Long Term Goals - 08/05/20 1208      PT LONG TERM GOAL #1   Title Pt will be independent with all HEP given    Baseline No knowledge    Time 6    Period Weeks    Status New    Target Date 09/16/20      PT LONG TERM GOAL #2   Title Pt will be able to perform household chores without exacerbating back pain and using pain managment techniques    Baseline Pt unable to sweep or wash dishes for longer than 10 minutes    Period Weeks    Status New    Target Date 09/16/20      PT LONG TERM GOAL #3   Title Pt will be able to perform a 5 x sts in 20 sec or less showing improved LE strength and control without pain    Baseline 55 sec baseline    Time 6    Period Weeks    Status New    Target Date 09/16/20      PT LONG TERM GOAL #  4   Title Pt will improve TUG time to 13 seconds or less to decrease risk of falls    Baseline 55 sec at eval    Time 6    Period Weeks    Status New    Target Date 09/16/20      PT LONG TERM GOAL #5   Title Pt will  be able to hold each position of 4 position balance test for atleast 10 seconds without LOB    Baseline unable to hold tandem or SLS (right SLS not tested today secondary to recent surgery)    Time 6    Period Weeks    Status New    Target Date 09/16/20                 Plan - 08/14/20 0816    Clinical Impression Statement Pt had increased right rib pain with supine to and from sit today but was able to use correct form.  She did very well coordinating breathing with exercises today. She was unable to initiate bridges today secondary to decreased strength. She required rest breaks today secondary to fatigue and intermittent nausea.  No new exs were added to HEP today    Personal Factors and Comorbidities Comorbidity 3+    Comorbidities Multiple Myeloma, multi joint OA, recent foot surgery, fall risk    Examination-Activity Limitations Sleep;Squat;Stand;Stairs;Transfers;Locomotion Level;Sit    Stability/Clinical Decision Making Evolving/Moderate complexity    Rehab Potential Good    PT Frequency 2x / week    PT Duration 6 weeks    PT Treatment/Interventions ADLs/Self Care Home Management;Aquatic Therapy;Iontophoresis 4mg /ml Dexamethasone;Gait training;Stair training;Functional mobility training;Therapeutic activities;Therapeutic exercise;Balance training;Neuromuscular re-education;Manual techniques;Patient/family education;Passive range of motion;Joint Manipulations    PT Next Visit Plan start slowly with generalized strength, balance, mobility, core strength.  Mindful of radiation for back pain/pelvic pain, rib pain, fatigues easily.    PT Home Exercise Plan meeks decompression, ppt, ppt with low march    Consulted and Agree with Plan of Care Patient           Patient will benefit from skilled therapeutic intervention in order to improve the following deficits and impairments:  Abnormal gait,Decreased mobility,Decreased activity tolerance,Decreased endurance,Decreased  strength,Decreased balance,Decreased knowledge of precautions,Difficulty walking,Postural dysfunction,Pain  Visit Diagnosis: Muscle weakness (generalized)  Chronic low back pain with left-sided sciatica, unspecified back pain laterality  Fatigue, unspecified type  Difficulty in walking, not elsewhere classified  Multiple myeloma not having achieved remission [facility])     Problem List Patient Active Problem List   Diagnosis Date Noted  . Multiple myeloma ([place]) 07/13/2020  . Claw toe, right   . Trigger finger, left middle finger 04/09/2020  . Stenosing tenosynovitis of finger of left hand 02/27/2020  . 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 Major depression, recurrent ([place]) 01/12/2018  . Chronic right shoulder pain 12/20/2017  . MDD (major depressive disorder), recurrent episode, moderate ([place]) 11/04/2017  . MGUS (monoclonal gammopathy of unknown significance) 10/24/2017  . Primary osteoarthritis of both hands 09/26/2017  . Primary osteoarthritis of right shoulder 09/26/2017  . Primary osteoarthritis of both hips 09/26/2017  . Status post total knee replacement, right 09/26/2017  . Primary osteoarthritis of both feet 09/26/2017  . History of asthma 09/26/2017  . Primary osteoarthritis of left knee 04/18/2017  . Menorrhagia 11/04/2016  . Fibroid tumor 11/04/2016  . Adenomyosis 11/04/2016  . Hypertension, essential 11/04/2016  . Total knee replacement status 01/28/2016    Elsie Ra [facility] 08/14/2020, 8:57 AM  [place], [place], [address] Phone: [phone]   Fax:  [phone]  Name: WILFRED SIVERSON MRN: [id number] Date of Birth: 06/24/1960 Cheral Almas, PT 08/14/20 9:00 AM

## 2020-08-16 NOTE — Progress Notes (Signed)
[facility]   [phone] [phone]   Clinic Follow up Note   Patient Care Team: Tisovec, Fransico Him, MD as PCP - General (Internal Medicine) Bo Merino, MD as Consulting Physician (Rheumatology) 08/17/2020  CHIEF COMPLAINT: F/up Multiple Myeloma  SUMMARY OF ONCOLOGIC HISTORY: Oncology History Overview Note  Cancer Staging Multiple myeloma ([place]) Staging form: Plasma Cell Myeloma and Plasma Cell Disorders, AJCC 8th Edition - Clinical stage from 07/20/2020: Beta-2-microglobulin (mg/L): 2.4, Albumin (g/dL): 3.2, ISS: Stage II, High-risk cytogenetics: Unknown, LDH: Normal - Signed by Truitt Merle, MD on 08/02/2020 Beta 2 microglobulin range (mg/L): Less than 3.5 Albumin range (g/dL): Less than 3.5 Cytogenetics: Unknown    Multiple myeloma ([place])  07/03/2020 Imaging   MRI Lumbar Spine  IMPRESSION: 1. Numerous T1 hypointense and STIR hyperintense lesions throughout the visualized spine and sacrum with multiple areas of extraosseous extension in the sacrum, detailed above and compatible with multiple myeloma given the clinical history. 2. An MRI of the pelvis with contrast could evaluate the full extent of the partially imaged sacral lesions, including left S1-S2 neural foraminal involvement and suspected involvement of the exited left L5 nerve. 3. Multilevel degenerative change without significant canal or foraminal stenosis in the lumbar spine.   07/13/2020 Initial Diagnosis   Multiple myeloma ([place])   07/20/2020 Cancer Staging   Staging form: Plasma Cell Myeloma and Plasma Cell Disorders, AJCC 8th Edition - Clinical stage from 07/20/2020: Beta-2-microglobulin (mg/L): 2.4, Albumin (g/dL): 3.2, ISS: Stage II, High-risk cytogenetics: Unknown, LDH: Normal - Signed by Truitt Merle, MD on 08/13/2020 Beta 2 microglobulin range (mg/L): Less than 3.5 Albumin range (g/dL): Less than 3.5 Cytogenetics: No abnormalities Bone disease on imaging: Present    07/24/2020 PET scan   IMPRESSION: 1. Moderate to high hypermetabolic activity associated with the expansile soft tissue masses within the LEFT and RIGHT pelvic bones is consistent with active multiple myeloma / plasmacytoma. 2. Lytic lesion in the LEFT scapula with mild metabolic activity is indeterminate. 3. Metabolic activity associated with the RIGHT knee prosthetic and RIGHT distal foot fusion is favored post procedural inflammation.     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  The findings are most consistent with  persistent/recurrent/previously known plasma cell neoplasm.  For  completeness, immunohistochemical stain for CD138 and in situ  hybridization for kappa and lambda will be performed and the results  reported in an addendum.  Correlation with cytogenetic and FISH studies  is recommended.   08/03/2020 -  Chemotherapy   Zometa starting 08/03/20    08/17/2020 -  Chemotherapy   PENDING Velcade weekly, Revlimid, dex $RemoveBefor'20mg'BmLkpGQLeHqJ$  weekly (VRd)  Starting 08/17/20.       CURRENT THERAPY:  1. Palliative raidation to pelvic lesion per Dr. Lisbeth Renshaw 07/30/20 - 08/12/20 2. Zometa, starting 08/03/20 3. PENDING induction VRd - velcade, revlimid, dex starting 08/17/20   INTERVAL HISTORY: Ms. [place]on returns for follow up to start treatment as scheduled.  She experienced pain relief from RT and was  able to do more around the house including mopping/sweeping and moved to  cabinet over the weekend.  Today she ambulated into cancer center, stood up after lab and had left hip stiffness.  Now she is in a wheelchair.  She has been able to come off most pain medicine, no recent opioids. Rehab is helping her pain. She is focusing on her self-care lately.  She has an appetite but is losing weight due to decreased taste.  She can taste certain fruits but not much else.  She had nausea vomiting x2 over the weekend feels it is diet related.  Stools are still loose/watery from RT with one episode of blood.  She takes an iron tablet, so her BMs "teeter between constipation and diarrhea."  She does have reflux that began recently, some pain with swallowing certain foods and medications.  Denies fever, chills, cough, chest pain, dyspnea, signs of thrombosis, or other concerns.   MEDICAL HISTORY:  Past Medical History:  Diagnosis Date  . Allergy   . Anemia   . Anxiety   . Arthritis   . Asthma   . Depression   . Hypertension     SURGICAL HISTORY: Past Surgical History:  Procedure Laterality Date  . CHOLECYSTECTOMY  1993  . COLONOSCOPY    . 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 TOTAL KNEE ARTHROPLASTY Right 01/28/2016   Procedure: RIGHT TOTAL KNEE ARTHROPLASTY;  Surgeon: Tarry Kos, MD;  Location: [facility];  Service: Orthopedics;  Laterality: Right;  . TRIGGER FINGER RELEASE Right 06/18/2014   Procedure: RIGHT LONG FINGER TRIGGER RELEASE;  Surgeon: Cheral Almas, MD;  Location: [facility];  Service: Orthopedics;  Laterality: Right;  . TUBAL LIGATION    . VAGINAL HYSTERECTOMY Bilateral 11/04/2016   Procedure: HYSTERECTOMY VAGINAL with Bilateral Salpingectomy;  Surgeon: Hal Morales, MD;  Location: [facility];  Service: Gynecology;  Laterality: Bilateral;  . WEIL OSTEOTOMY Right 07/07/2020   Procedure: RIGHT FOOT WEIL OSTEOTOMY 2, 3, AND 4 METATARSALS AND PROXIMAL INTERPHALANGEAL JOINT FUSION 2 & 4 TOES;  Surgeon: Nadara Mustard, MD;  Location: [facility]  ;  Service: Orthopedics;  Laterality: Right;    I have reviewed the social history and family history with the patient and they are unchanged from previous note.  ALLERGIES:  is allergic to elemental sulfur.  MEDICATIONS:  Current Outpatient Medications  Medication Sig Dispense Refill  . ondansetron (ZOFRAN) 8 MG tablet Take 1 tablet (8 mg total) by mouth every 8 (eight) hours as needed for nausea or vomiting. 20 tablet 3  . pantoprazole (PROTONIX) 20 MG tablet Take 1 tablet (20 mg total) by mouth daily. 30 tablet 0  . potassium chloride SA (KLOR-CON) 20 MEQ tablet Take 1 tablet (20 mEq total) by mouth 2 (two) times daily. 60 tablet 0  . albuterol (VENTOLIN HFA) 108 (90 Base) MCG/ACT inhaler Inhale into the lungs.    . budesonide-formoterol (SYMBICORT) 160-4.5 MCG/ACT inhaler Inhale 2 puffs into the lungs daily as needed.    Marland Kitchen buPROPion (WELLBUTRIN XL) 150 MG 24 hr tablet 450 mg daily (150 mg + 300 mg) (Patient not taking: No sig reported) 30 tablet 0  . chlorthalidone (HYGROTON) 25 MG tablet TAKE 1 TABLET BY MOUTH EVERY DAY FOR HTN  3  . Cholecalciferol (VITAMIN D3) 1.25 MG (50000 UT) CAPS Take 1 capsule by mouth once a week.    Marland Kitchen dexamethasone (DECADRON) 4 MG tablet Take 1 tablet (4 mg total) by mouth daily. 30  tablet 0  . ferrous sulfate 325 (65 FE) MG tablet Take 325 mg by mouth 2 (two) times daily with a meal.    . HYDROcodone-acetaminophen (NORCO) 5-325 MG tablet Take 1-2 tablets by mouth every 8 (eight) hours as needed for severe pain. 60 tablet 0  . lenalidomide (REVLIMID) 25 MG capsule Take 1 tablet daily for 21 days, then no revlimid for 7 days [employer] Auth # [id number]    Date Obtained 07/22/2020 21 capsule 0  . sertraline (ZOLOFT) 50 MG tablet Take 1 tablet (50 mg total) by mouth at bedtime. (Patient not taking: No sig reported) 90 tablet 0  . traZODone (DESYREL) 50 MG tablet Take 1 tablet (50 mg total) by mouth at bedtime. (Patient not taking: No sig reported) 90  tablet 0  . valACYclovir (VALTREX) 500 MG tablet Take 500 mg by mouth 2 (two) times daily.    . Vitamin D, Ergocalciferol, (DRISDOL) 50000 units CAPS capsule Take 50,000 Units by mouth every Monday.      No current facility-administered medications for this visit.    PHYSICAL EXAMINATION: ECOG PERFORMANCE STATUS: 1 - Symptomatic but completely ambulatory  Vitals:   08/17/20 0901 08/17/20 0912  BP: 126/86 108/62  Pulse: 61 97  Resp: 17 17  Temp: (!) 97.1 F (36.2 C) (!) 97.5 F (36.4 C)  SpO2: 100% 100%   Filed Weights   08/17/20 0901 08/17/20 0912  Weight: 188 lb (85.3 kg) 208 lb 4.8 oz (94.5 kg)    GENERAL:alert, no distress and comfortable SKIN: No rash EYES:  sclera clear LUNGS: normal breathing effort HEART:  no lower extremity edema NEURO: alert & oriented x 3 with fluent speech, no focal motor/sensory deficits  LABORATORY DATA:  I have reviewed the data as listed CBC Latest Ref Rng & Units 08/17/2020 08/03/2020 07/30/2020  WBC 4.0 - 10.5 K/uL 5.2 7.0 7.7  Hemoglobin 12.0 - 15.0 g/dL 9.5(L) 9.7(L) 9.2(L)  Hematocrit 36.0 - 46.0 % 28.1(L) 28.6(L) 28.3(L)  Platelets 150 - 400 K/uL 128(L) 181 216     CMP Latest Ref Rng & Units 08/17/2020 08/03/2020 07/20/2020  Glucose 70 - 99 mg/dL 113(H) 99 97  BUN 6 - 20 mg/dL 38(H) 27(H) 12  Creatinine 0.44 - 1.00 mg/dL 1.11(H) 0.88 0.83  Sodium 135 - 145 mmol/L 137 136 142  Potassium 3.5 - 5.1 mmol/L 3.0(L) 3.1(L) 3.8  Chloride 98 - 111 mmol/L 97(L) 97(L) 103  CO2 22 - 32 mmol/L _0 Calcium 8.9 - 10.3 mg/dL 7.9(L) 9.5 8.9  Total Protein 6.5 - 8.1 g/dL 10.0(H) 10.5(H) 9.7(H)  Total Bilirubin 0.3 - 1.2 mg/dL 0.5 0.5 0.3  Alkaline Phos 38 - 126 U/L 62 63 68  AST 15 - 41 U/L 17 19 13(L)  ALT 0 - 44 U/L _1 RADIOGRAPHIC STUDIES: I have personally reviewed the radiological images as listed and agreed with the findings in the report. No results found.   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  -Her MM labs  from 06/01/20 showed increase inIgA and and M-proteinto 3.3. Her light chain is also increasing with ratio 87.53, which is suspicious for MM. -Her recent lumbar MRI showedmultiple T1 lesions in her lumbarspine and sacrum compatible with MM. -Staging PET scan from 07/24/2020 showed large hypermetabolic activity associated with the expansile soft tissue mass within the left and right pelvic bones, consistent with active MM/plasmacytoma, and a lytic lesion in the left scapula with mild activity.   -Due to her mobility issues and pain, she completed palliative RT to pelvis, scapula/left chest, T4-6 and T10-12 by Dr. Lisbeth Renshaw from 07/30/20 - 08/12/20 -She underwent staging bone marrow biopsy on 07/30/2020 which showed slightly hypercellular marrow for age and 31% plasma cells.  Cytogenetics and FISH were unremarkable -Dr. Burr Medico has recommended induction VRD, she previously consented and attended chemo class.  She will start today 08/17/2020 -she is s/p first shingrix vaccine 07/14/20 (second inj in 2-6 months).  Continue prophylaxis with valacyclovir -Return for cycle 1 day 8 Velcade next week -Follow-up in 2 weeks  2. Low back pain, right hip pain  -She initially attributes her pain to sciatica pain.  -work up per ortho showed multiple T1 lesions of her spine and sacrum compatible with MM on her 07/03/20 MRI lumbar spine. -PET 07/24/20 showed large  hypermetabolic lesions in the left and right pelvis consistent with MM/plasmacytoma and a faintly metabolic lesion in the left scapula which is not currently causing significant pain. -She completed palliative radiation to the pelvis, left scapula, T4-6, and T10-12 on 3/3-3/16/2022  -She received Zometa on 08/03/2020, calcium decreased to 7.9 today.  She will start oral calcium -Pain improved after RT, she has not required pain medication recently and she was able to increase activity level  3. 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  -Hemoglobin electrophoresisshowed increase in Hg C, consistent with Hg C trait.  She is aware her children are at risk ofhemoglobin C trait, they have been encouraged to be tested. -Anemia worsened recently with MM -Continue oral ironBID.  4. HTN, OA, Obesity -On chlorthalidonefor HTN  -She will continue to f/u with PCP, Chiropractor and Ortho.  5.Anxiety, depression -under care of [facility] provider, toleratingZoloft -She is overwhelmed with the diagnosis and information but otherwise stable.  Disposition:  Ms. Wnuk appears stable.  Her pain improved after RT until she developed left hip stiffness this morning, likely MSK related from overuse over the weekend.  She has also developed what I feel is likely radiation esophagitis, will start PPI.  We spent most of the time in today's appointment discussing pain regimen, nutrition/hydration, and other symptom management.   She continues to lose weight which is likely secondary to multiple myeloma.  She declined an appetite stimulant, I recommend to add nutrition supplements.  Labs reviewed, Hg 9.5, platelet 128, K3.0, SCR 1.11, Ca 7.9 s/p Zometa.  I encouraged her to monitor for bleeding, hydrate, and begin K (BID) and Ca (once daily) supplements.   She will proceed with cycle 1 day 1 induction VRD today as planned.  She knows to  take Revlimid once daily for 21 days then off for 7 days.  We will stop her daily Dex 4 mg dose and received 20 mg in infusion room with each Velcade.  Continue viral prophylaxis.  She will return for lab and cycle 1 day 8 Velcade next week, follow-up in 2 weeks.  All questions were answered. The patient knows to call the  clinic with any problems, questions or concerns. No barriers to learning were detected.  Total encounter time was 30 minutes.     Alla Feeling, NP 08/17/20

## 2020-08-17 ENCOUNTER — Inpatient Hospital Stay: Payer: BC Managed Care – PPO

## 2020-08-17 ENCOUNTER — Inpatient Hospital Stay (HOSPITAL_BASED_OUTPATIENT_CLINIC_OR_DEPARTMENT_OTHER): Payer: BC Managed Care – PPO | Admitting: Nurse Practitioner

## 2020-08-17 ENCOUNTER — Ambulatory Visit: Payer: BC Managed Care – PPO

## 2020-08-17 ENCOUNTER — Other Ambulatory Visit: Payer: Self-pay

## 2020-08-17 ENCOUNTER — Encounter: Payer: Self-pay | Admitting: Nurse Practitioner

## 2020-08-17 VITALS — BP 108/62 | HR 97 | Temp 97.5°F | Resp 17 | Ht 66.0 in | Wt 208.3 lb

## 2020-08-17 DIAGNOSIS — C9000 Multiple myeloma not having achieved remission: Secondary | ICD-10-CM | POA: Diagnosis not present

## 2020-08-17 DIAGNOSIS — I10 Essential (primary) hypertension: Secondary | ICD-10-CM | POA: Diagnosis not present

## 2020-08-17 DIAGNOSIS — M1990 Unspecified osteoarthritis, unspecified site: Secondary | ICD-10-CM | POA: Diagnosis not present

## 2020-08-17 DIAGNOSIS — D472 Monoclonal gammopathy: Secondary | ICD-10-CM

## 2020-08-17 DIAGNOSIS — F419 Anxiety disorder, unspecified: Secondary | ICD-10-CM | POA: Diagnosis not present

## 2020-08-17 DIAGNOSIS — E669 Obesity, unspecified: Secondary | ICD-10-CM | POA: Diagnosis not present

## 2020-08-17 DIAGNOSIS — D509 Iron deficiency anemia, unspecified: Secondary | ICD-10-CM | POA: Diagnosis not present

## 2020-08-17 DIAGNOSIS — F32A Depression, unspecified: Secondary | ICD-10-CM | POA: Diagnosis not present

## 2020-08-17 LAB — CBC WITH DIFFERENTIAL (CANCER CENTER ONLY)
Abs Immature Granulocytes: 0.04 10*3/uL (ref 0.00–0.07)
Basophils Absolute: 0 10*3/uL (ref 0.0–0.1)
Basophils Relative: 0 %
Eosinophils Absolute: 0.1 10*3/uL (ref 0.0–0.5)
Eosinophils Relative: 3 %
HCT: 28.1 % — ABNORMAL LOW (ref 36.0–46.0)
Hemoglobin: 9.5 g/dL — ABNORMAL LOW (ref 12.0–15.0)
Immature Granulocytes: 1 %
Lymphocytes Relative: 8 %
Lymphs Abs: 0.4 10*3/uL — ABNORMAL LOW (ref 0.7–4.0)
MCH: 24.2 pg — ABNORMAL LOW (ref 26.0–34.0)
MCHC: 33.8 g/dL (ref 30.0–36.0)
MCV: 71.5 fL — ABNORMAL LOW (ref 80.0–100.0)
Monocytes Absolute: 0.3 10*3/uL (ref 0.1–1.0)
Monocytes Relative: 6 %
Neutro Abs: 4.3 10*3/uL (ref 1.7–7.7)
Neutrophils Relative %: 82 %
Platelet Count: 128 10*3/uL — ABNORMAL LOW (ref 150–400)
RBC: 3.93 MIL/uL (ref 3.87–5.11)
RDW: 17.2 % — ABNORMAL HIGH (ref 11.5–15.5)
WBC Count: 5.2 10*3/uL (ref 4.0–10.5)
nRBC: 0 % (ref 0.0–0.2)

## 2020-08-17 LAB — CMP (CANCER CENTER ONLY)
ALT: 30 U/L (ref 0–44)
AST: 17 U/L (ref 15–41)
Albumin: 3.2 g/dL — ABNORMAL LOW (ref 3.5–5.0)
Alkaline Phosphatase: 62 U/L (ref 38–126)
Anion gap: 13 (ref 5–15)
BUN: 38 mg/dL — ABNORMAL HIGH (ref 6–20)
CO2: 27 mmol/L (ref 22–32)
Calcium: 7.9 mg/dL — ABNORMAL LOW (ref 8.9–10.3)
Chloride: 97 mmol/L — ABNORMAL LOW (ref 98–111)
Creatinine: 1.11 mg/dL — ABNORMAL HIGH (ref 0.44–1.00)
GFR, Estimated: 58 mL/min — ABNORMAL LOW (ref >60)
Glucose, Bld: 113 mg/dL — ABNORMAL HIGH (ref 70–99)
Potassium: 3 mmol/L — ABNORMAL LOW (ref 3.5–5.1)
Sodium: 137 mmol/L (ref 135–145)
Total Bilirubin: 0.5 mg/dL (ref 0.3–1.2)
Total Protein: 10 g/dL — ABNORMAL HIGH (ref 6.5–8.1)

## 2020-08-17 MED ORDER — POTASSIUM CHLORIDE 10 MEQ/100ML IV SOLN
10.0000 meq | Freq: Once | INTRAVENOUS | Status: AC
  Administered 2020-08-17: 10 meq via INTRAVENOUS

## 2020-08-17 MED ORDER — DEXAMETHASONE 4 MG PO TABS
ORAL_TABLET | ORAL | Status: AC
  Filled 2020-08-17: qty 5

## 2020-08-17 MED ORDER — PANTOPRAZOLE SODIUM 20 MG PO TBEC: 20.0000 mg | DELAYED_RELEASE_TABLET | Freq: Every day | ORAL | 0 refills | Status: DC

## 2020-08-17 MED ORDER — POTASSIUM CHLORIDE 10 MEQ/100ML IV SOLN
INTRAVENOUS | Status: AC
  Filled 2020-08-17: qty 100

## 2020-08-17 MED ORDER — ONDANSETRON HCL 8 MG PO TABS: 8.0000 mg | ORAL_TABLET | Freq: Three times a day (TID) | ORAL | 3 refills | Status: DC | PRN

## 2020-08-17 MED ORDER — POTASSIUM CHLORIDE CRYS ER 20 MEQ PO TBCR: 20.0000 meq | EXTENDED_RELEASE_TABLET | Freq: Two times a day (BID) | ORAL | 0 refills | Status: DC

## 2020-08-17 MED ORDER — DEXAMETHASONE 4 MG PO TABS
20.0000 mg | ORAL_TABLET | Freq: Once | ORAL | Status: AC
  Administered 2020-08-17: 20 mg via ORAL

## 2020-08-17 MED ORDER — SODIUM CHLORIDE 0.9 % IV SOLN
Freq: Once | INTRAVENOUS | Status: AC
  Filled 2020-08-17: qty 250

## 2020-08-17 MED ORDER — BORTEZOMIB CHEMO SQ INJECTION 3.5 MG (2.5MG/ML)
1.3000 mg/m2 | Freq: Once | INTRAMUSCULAR | Status: AC
  Administered 2020-08-17: 2.75 mg via SUBCUTANEOUS
  Filled 2020-08-17: qty 1.1

## 2020-08-17 NOTE — Patient Instructions (Signed)
Bortezomib injection What is this medicine? BORTEZOMIB (bor TEZ oh mib) targets proteins in cancer cells and stops the cancer cells from growing. It treats multiple myeloma and mantle cell lymphoma. This medicine may be used for other purposes; ask your health care provider or pharmacist if you have questions. COMMON BRAND NAME(S): Velcade What should I tell my health care provider before I take this medicine? They need to know if you have any of these conditions:  dehydration  diabetes (high blood sugar)  heart disease  liver disease  tingling of the fingers or toes or other nerve disorder  an unusual or allergic reaction to bortezomib, mannitol, boron, other medicines, foods, dyes, or preservatives  pregnant or trying to get pregnant  breast-feeding How should I use this medicine? This medicine is injected into a vein or under the skin. It is given by a health care provider in a hospital or clinic setting. Talk to your health care provider about the use of this medicine in children. Special care may be needed. Overdosage: If you think you have taken too much of this medicine contact a poison control center or emergency room at once. NOTE: This medicine is only for you. Do not share this medicine with others. What if I miss a dose? Keep appointments for follow-up doses. It is important not to miss your dose. Call your health care provider if you are unable to keep an appointment. What may interact with this medicine? This medicine may interact with the following medications:  ketoconazole  rifampin This list may not describe all possible interactions. Give your health care provider a list of all the medicines, herbs, non-prescription drugs, or dietary supplements you use. Also tell them if you smoke, drink alcohol, or use illegal drugs. Some items may interact with your medicine. What should I watch for while using this medicine? Your condition will be monitored carefully while  you are receiving this medicine. You may need blood work done while you are taking this medicine. You may get drowsy or dizzy. Do not drive, use machinery, or do anything that needs mental alertness until you know how this medicine affects you. Do not stand up or sit up quickly, especially if you are an older patient. This reduces the risk of dizzy or fainting spells This medicine may increase your risk of getting an infection. Call your health care provider for advice if you get a fever, chills, sore throat, or other symptoms of a cold or flu. Do not treat yourself. Try to avoid being around people who are sick. Check with your health care provider if you have severe diarrhea, nausea, and vomiting, or if you sweat a lot. The loss of too much body fluid may make it dangerous for you to take this medicine. Do not become pregnant while taking this medicine or for 7 months after stopping it. Women should inform their health care provider if they wish to become pregnant or think they might be pregnant. Men should not father a child while taking this medicine and for 4 months after stopping it. There is a potential for serious harm to an unborn child. Talk to your health care provider for more information. Do not breast-feed an infant while taking this medicine or for 2 months after stopping it. This medicine may make it more difficult to get pregnant or father a child. Talk to your health care provider if you are concerned about your fertility. What side effects may I notice from receiving this medicine?   Side effects that you should report to your doctor or health care professional as soon as possible:  allergic reactions (skin rash; itching or hives; swelling of the face, lips, or tongue)  bleeding (bloody or black, tarry stools; red or dark brown urine; spitting up blood or brown material that looks like coffee grounds; red spots on the skin; unusual bruising or bleeding from the eye, gums, or  nose)  blurred vision or changes in vision  confusion  constipation  headache  heart failure (trouble breathing; fast, irregular heartbeat; sudden weight gain; swelling of the ankles, feet, hands)  infection (fever, chills, cough, sore throat, pain or trouble passing urine)  lack or loss of appetite  liver injury (dark yellow or brown urine; general ill feeling or flu-like symptoms; loss of appetite, right upper belly pain; yellowing of the eyes or skin)  low blood pressure (dizziness; feeling faint or lightheaded, falls; unusually weak or tired)  muscle cramps  pain, redness, or irritation at site where injected  pain, tingling, numbness in the hands or feet  seizures  trouble breathing  unusual bruising or bleeding Side effects that usually do not require medical attention (report to your doctor or health care professional if they continue or are bothersome):  diarrhea  nausea  stomach pain  trouble sleeping  vomiting This list may not describe all possible side effects. Call your doctor for medical advice about side effects. You may report side effects to FDA at 1-800-FDA-1088. Where should I keep my medicine? This medicine is given in a hospital or clinic. It will not be stored at home. NOTE: This sheet is a summary. It may not cover all possible information. If you have questions about this medicine, talk to your doctor, pharmacist, or health care provider.  2021 Elsevier/Gold Standard (2020-05-07 13:22:53)

## 2020-08-17 NOTE — Progress Notes (Signed)
  Patient Name: Hannah [name] MRN: [id number] DOB: 1961/03/26 Referring Physician: Domenick Gong (Profile Not Attached) Date of Service: 08/12/2020 [facility], [place]                                                        End Of Treatment Note  Diagnoses: C79.51-Secondary malignant neoplasm of bone  Cancer Staging:  Multiple Myeloma not having achieved remission.  Intent: Palliative  Radiation Treatment Dates: 07/30/2020 through 08/12/2020 Site Technique Total Dose (Gy) Dose per Fx (Gy) Completed Fx Beam Energies  Pelvis: Pelvis 3D 30/30 3 10/10 15X  Scapula, Left: Chest_Lt Complex 25/25 2.5 10/10 10X  Spine: Spine_T4-6 Complex 20/20 4 5/5 10X, 15X  Spine: Spine_T10-12 Complex 20/20 4 5/5 15X   Narrative: The patient tolerated radiation therapy relatively well.  She did develop progressive pain in her mid back/trunk. After reassessing her PET scan, she was felt to have a target in the thoracic spine, so she did complete additional treatment as above not only to her scapula and pelvis, but also her thoracic spine.   Plan: The patient will receive a call in about one month from the radiation oncology department. She will continue follow up with Dr. Burr Medico as she begins systemic therapy.  ________________________________________________    Carola Rhine, PAC

## 2020-08-17 NOTE — Telephone Encounter (Signed)
Oral Chemotherapy Pharmacist Encounter  I spoke with patient for overview of: Revlimid for the treatment of  multiple myeloma in conjunction with Velcade and dexamethasone, planned for duration of induction therapy pending BMT..   Counseled patient on administration, dosing, side effects, monitoring, drug-food interactions, safe handling, storage, and disposal.  Patient will take Revlimid 70m capsules, 1 capsule by mouth once daily, without regard to food, with a full glass of water.  Revlimid will be given 21 days on, 7 days off, repeat every 28 days.  Patient will receive dexamethasone 442mtablets, 5 tablets (2022mby mouth once weekly with breakfast. Patient will receive dexamethasone while in clinic.   Revlimid start date: 08/17/20  Adverse effects of Revlimid include but are not limited to: nausea, constipation, diarrhea, abdominal pain, rash, fatigue, drug fever, and decreased blood counts.    Reviewed with patient importance of keeping a medication schedule and plan for any missed doses. No barriers to medication adherence identified.  Medication reconciliation performed and medication/allergy list updated.  Patient confirmed she is taking valacyclovir. Patient counseled on importance of daily aspirin 70m65mr VTE prophylaxis. She will pick up baby aspirin and start taking it 08/17/20.  Insurance authorization for Revlimid has been obtained.  Revlimid prescription is being dispensed from [facility] as it is a limited distribution medication.  All questions answered.  Ms. GeraFlurryced understanding and appreciation.   Medication education handout and medication calendar given to patient. Patient knows to call the office with questions or concerns. Oral Chemotherapy Clinic phone number provided to patient.   RebeLeron CroakarmD, BCPS Hematology/Oncology Clinical Pharmacist [phone]/2022 10:17 AM

## 2020-08-18 ENCOUNTER — Ambulatory Visit: Payer: BC Managed Care – PPO | Admitting: Physical Therapy

## 2020-08-18 ENCOUNTER — Telehealth: Payer: Self-pay | Admitting: Nurse Practitioner

## 2020-08-18 ENCOUNTER — Ambulatory Visit: Payer: BC Managed Care – PPO

## 2020-08-18 ENCOUNTER — Encounter: Payer: Self-pay | Admitting: Physical Therapy

## 2020-08-18 DIAGNOSIS — M5442 Lumbago with sciatica, left side: Secondary | ICD-10-CM | POA: Diagnosis not present

## 2020-08-18 DIAGNOSIS — R262 Difficulty in walking, not elsewhere classified: Secondary | ICD-10-CM | POA: Diagnosis not present

## 2020-08-18 DIAGNOSIS — R5383 Other fatigue: Secondary | ICD-10-CM

## 2020-08-18 DIAGNOSIS — M6281 Muscle weakness (generalized): Secondary | ICD-10-CM

## 2020-08-18 DIAGNOSIS — C9000 Multiple myeloma not having achieved remission: Secondary | ICD-10-CM | POA: Diagnosis not present

## 2020-08-18 DIAGNOSIS — G8929 Other chronic pain: Secondary | ICD-10-CM | POA: Diagnosis not present

## 2020-08-18 DIAGNOSIS — R252 Cramp and spasm: Secondary | ICD-10-CM | POA: Diagnosis not present

## 2020-08-18 DIAGNOSIS — M25652 Stiffness of left hip, not elsewhere classified: Secondary | ICD-10-CM | POA: Diagnosis not present

## 2020-08-18 NOTE — Telephone Encounter (Signed)
Checked out appointment. No LOS notes needing to be scheduled. No changes made. 

## 2020-08-18 NOTE — Therapy (Signed)
[place], [place], [address] Phone: [phone]   Fax:  [phone]  Physical Therapy Treatment  Patient Details  Name: Hannah [name] MRN: [id number] Date of Birth: 11-02-1960 Referring Provider (PT): Cira Rue   Encounter Date: 08/18/2020   PT End of Session - 08/18/20 0855    Visit Number 4    Number of Visits 12    Date for PT Re-Evaluation 09/16/20    Authorization Type BCBS    PT Start Time 0806    PT Stop Time 0852    PT Time Calculation (min) 46 min    Activity Tolerance Patient tolerated treatment well    Behavior During Therapy [facility] for tasks assessed/performed           Past Medical History:  Diagnosis Date  . Allergy   . Anemia   . Anxiety   . Arthritis   . Asthma   . Depression   . Hypertension     Past Surgical History:  Procedure Laterality Date  . CHOLECYSTECTOMY  1993  . COLONOSCOPY    . 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 TOTAL KNEE ARTHROPLASTY Right 01/28/2016   Procedure: RIGHT TOTAL KNEE ARTHROPLASTY;  Surgeon: [name], MD;  Location: [place];  Service: Orthopedics;  Laterality: Right;  . TRIGGER FINGER RELEASE Right 06/18/2014   Procedure: RIGHT LONG FINGER TRIGGER RELEASE;  Surgeon: [name], MD;  Location: [place];  Service: Orthopedics;  Laterality: Right;  . TUBAL LIGATION    . VAGINAL HYSTERECTOMY Bilateral 11/04/2016   Procedure: HYSTERECTOMY VAGINAL with Bilateral Salpingectomy;  Surgeon: [name], MD;  Location: [facility];  Service: Gynecology;  Laterality: Bilateral;  . WEIL OSTEOTOMY Right 07/07/2020   Procedure: RIGHT FOOT WEIL OSTEOTOMY 2, 3, AND 4 METATARSALS AND PROXIMAL INTERPHALANGEAL JOINT FUSION 2 & 4 TOES;  Surgeon: Newt Minion, MD;  Location: [place];  Service: Orthopedics;  Laterality: Right;    There were no vitals filed for this visit.   Subjective Assessment - 08/18/20 0807     Subjective I got my first chemo treatment yesterday. They started me on hydrocodone for the back pain. I am able to walk without my cane.    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 She as diagnosed in February 2022 with Multiple Myeloma not in remission. She is presently having palliative radiation for her back and pelvic pain, and has had 1 treatment of Zometa.  She will start chemotherapy on 08/17/20, She fatigues very easily.  She is presently out of work and just got out of a post op shoe for her right foot.  She is using a SPC corrected now to use with left UE. 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    Currently in Pain? Yes    Pain Score 3     Pain Location Pelvis    Pain Orientation Right    Pain Descriptors / Indicators Aching;Constant    Pain Type Chronic pain    Pain Onset More than a month ago    Pain Frequency Constant    Aggravating Factors  movement    Pain Relieving Factors heat    Effect of Pain on Daily Activities limits ability - can not do any lifting, pulling, tugging has to do short periods of activity  [place] Adult PT Treatment/Exercise - 08/18/20 0001      Lumbar Exercises: Stretches   Other Lumbar Stretch Exercise Meeks decompression exercises - all x 5 with 3 sec holds      Lumbar Exercises: Standing   Heel Raises 10 reps   v/c to keep weight through all toes     Lumbar Exercises: Supine   Pelvic Tilt 10 reps    Pelvic Tilt Limitations pt did not require v/c today for diaphragmatic breathing    Glut Set 10 reps;3 seconds    Heel Slides 5 reps   bilaterally.  holding tilt then sliding leg   Bent Knee Raise 10 reps   while maintaining pelvic tilt   Other Supine Lumbar Exercises pelvic tilt with knee bent/hip abduction while maintaining pelvic tilt - pt required less cueing for  coordinating this    Other Supine Lumbar Exercises pelvic tilt with ball squeeze with 3 sec hold x 5      Knee/Hip Exercises: Standing   Knee Flexion Strengthening;Both;1 set;10 reps   holding on to counter for support   Hip Abduction Stengthening;Both;1 set;10 reps;Knee straight   holding on to counter- v/c for slow and controlled movement   Hip Extension Both;1 set;10 reps   more pain on L side, v/c not to lean forward- holding counter for support   Functional Squat 10 reps   at counter for support, v/c not to let knees go over toes   Functional Squat Limitations some pain in low back - faitguing                    PT Short Term Goals - 05/19/20 1432      PT SHORT TERM GOAL #1   Title STG=LTG             PT Long Term Goals - 08/05/20 1208      PT LONG TERM GOAL #1   Title Pt will be independent with all HEP given    Baseline No knowledge    Time 6    Period Weeks    Status New    Target Date 09/16/20      PT LONG TERM GOAL #2   Title Pt will be able to perform household chores without exacerbating back pain and using pain managment techniques    Baseline Pt unable to sweep or wash dishes for longer than 10 minutes    Period Weeks    Status New    Target Date 09/16/20      PT LONG TERM GOAL #3   Title Pt will be able to perform a 5 x sts in 20 sec or less showing improved LE strength and control without pain    Baseline 55 sec baseline    Time 6    Period Weeks    Status New    Target Date 09/16/20      PT LONG TERM GOAL #4   Title Pt will improve TUG time to 13 seconds or less to decrease risk of falls    Baseline 55 sec at eval    Time 6    Period Weeks    Status New    Target Date 09/16/20      PT LONG TERM GOAL #5   Title Pt will be able to hold each position of 4 position balance test for atleast 10 seconds without LOB    Baseline unable to hold tandem or SLS (right SLS not tested today secondary to  recent surgery)    Time 6    Period Weeks     Status New    Target Date 09/16/20                 Plan - 08/18/20 0856    Clinical Impression Statement Pt was able to tolerate standing exercises today. She did require several recovery periods but was able to complete without much increased pain or nausea. Continued with supine strengthening exercises today with continued focus on improving core strength and pt required less cueing for correct form. She did become nauseated towards end of session so exercises were stopped early due to this. Her nausea is a result of chemo. Will see how pt felt after standing exercises and possibly add these to her home exercise program.    PT Frequency 2x / week    PT Duration 6 weeks    PT Treatment/Interventions ADLs/Self Care Home Management;Aquatic Therapy;Iontophoresis 82m/ml Dexamethasone;Gait training;Stair training;Functional mobility training;Therapeutic activities;Therapeutic exercise;Balance training;Neuromuscular re-education;Manual techniques;Patient/family education;Passive range of motion;Joint Manipulations    PT Next Visit Plan see how pt felt after standing execises and add these to HEP, start slowly with generalized strength, balance, mobility, core strength.  Mindful of radiation for back pain/pelvic pain, rib pain, fatigues easily.    PT Home Exercise Plan meeks decompression, ppt, ppt with low march    Consulted and Agree with Plan of Care Patient           Patient will benefit from skilled therapeutic intervention in order to improve the following deficits and impairments:  Abnormal gait,Decreased mobility,Decreased activity tolerance,Decreased endurance,Decreased strength,Decreased balance,Decreased knowledge of precautions,Difficulty walking,Postural dysfunction,Pain  Visit Diagnosis: Muscle weakness (generalized)  Chronic low back pain with left-sided sciatica, unspecified back pain laterality  Fatigue, unspecified type  Difficulty in walking, not elsewhere  classified     Problem List Patient Active Problem List   Diagnosis Date Noted  . Multiple myeloma ([place] 07/13/2020  . Claw toe, right   . Trigger finger, left middle finger 04/09/2020  . Stenosing tenosynovitis of finger of left hand 02/27/2020  . Hemoglobin C trait ([place] 02/27/2018  . Major depression, recurrent ([place] 01/12/2018  . Chronic right shoulder pain 12/20/2017  . MDD (major depressive disorder), recurrent episode, moderate ([place] 11/04/2017  . MGUS (monoclonal gammopathy of unknown significance) 10/24/2017  . Primary osteoarthritis of both hands 09/26/2017  . Primary osteoarthritis of right shoulder 09/26/2017  . Primary osteoarthritis of both hips 09/26/2017  . Status post total knee replacement, right 09/26/2017  . Primary osteoarthritis of both feet 09/26/2017  . History of asthma 09/26/2017  . Primary osteoarthritis of left knee 04/18/2017  . Menorrhagia 11/04/2016  . Fibroid tumor 11/04/2016  . Adenomyosis 11/04/2016  . Hypertension, essential 11/04/2016  . Total knee replacement status 01/28/2016    BAllyson SabalBChambers Memorial Hospital3/22/2022, 9:01 AM  [place] [address]Phone: [phone]  Fax:  [phone] Name: Hannah BRUNELLIMRN: [id number] of Birth: 808/29/1962 BManus Gunning PT 08/18/20 9:01 AM

## 2020-08-19 ENCOUNTER — Ambulatory Visit: Payer: BC Managed Care – PPO

## 2020-08-19 LAB — MULTIPLE MYELOMA PANEL, SERUM
Albumin SerPl Elph-Mcnc: 3.7 g/dL (ref 2.9–4.4)
Albumin/Glob SerPl: 0.6 — ABNORMAL LOW (ref 0.7–1.7)
Alpha 1: 0.2 g/dL (ref 0.0–0.4)
Alpha2 Glob SerPl Elph-Mcnc: 0.7 g/dL (ref 0.4–1.0)
B-Globulin SerPl Elph-Mcnc: 4.9 g/dL — ABNORMAL HIGH (ref 0.7–1.3)
Gamma Glob SerPl Elph-Mcnc: 0.3 g/dL — ABNORMAL LOW (ref 0.4–1.8)
Globulin, Total: 6.2 g/dL — ABNORMAL HIGH (ref 2.2–3.9)
IgA: 5356 mg/dL — ABNORMAL HIGH (ref 87–352)
IgG (Immunoglobin G), Serum: 401 mg/dL — ABNORMAL LOW (ref 586–1602)
IgM (Immunoglobulin M), Srm: 13 mg/dL — ABNORMAL LOW (ref 26–217)
M Protein SerPl Elph-Mcnc: 3.4 g/dL — ABNORMAL HIGH
Total Protein ELP: 9.9 g/dL — ABNORMAL HIGH (ref 6.0–8.5)

## 2020-08-20 LAB — KAPPA/LAMBDA LIGHT CHAINS
Kappa free light chain: 547.6 mg/L — ABNORMAL HIGH (ref 3.3–19.4)
Kappa, lambda light chain ratio: 161.06 — ABNORMAL HIGH (ref 0.26–1.65)
Lambda free light chains: 3.4 mg/L — ABNORMAL LOW (ref 5.7–26.3)

## 2020-08-21 ENCOUNTER — Other Ambulatory Visit: Payer: Self-pay

## 2020-08-21 ENCOUNTER — Ambulatory Visit: Payer: BC Managed Care – PPO

## 2020-08-21 DIAGNOSIS — M5442 Lumbago with sciatica, left side: Secondary | ICD-10-CM | POA: Diagnosis not present

## 2020-08-21 DIAGNOSIS — R252 Cramp and spasm: Secondary | ICD-10-CM | POA: Diagnosis not present

## 2020-08-21 DIAGNOSIS — R5383 Other fatigue: Secondary | ICD-10-CM

## 2020-08-21 DIAGNOSIS — M25652 Stiffness of left hip, not elsewhere classified: Secondary | ICD-10-CM | POA: Diagnosis not present

## 2020-08-21 DIAGNOSIS — G8929 Other chronic pain: Secondary | ICD-10-CM | POA: Diagnosis not present

## 2020-08-21 DIAGNOSIS — M6281 Muscle weakness (generalized): Secondary | ICD-10-CM | POA: Diagnosis not present

## 2020-08-21 DIAGNOSIS — R262 Difficulty in walking, not elsewhere classified: Secondary | ICD-10-CM

## 2020-08-21 DIAGNOSIS — C9000 Multiple myeloma not having achieved remission: Secondary | ICD-10-CM | POA: Diagnosis not present

## 2020-08-21 NOTE — Patient Instructions (Signed)
Access Code: [email or web address] URL: [email or web address] Date: 08/21/2020 Prepared by: Cheral Almas  Exercises Standing Heel Raise - 1 x daily - 3 x weekly - 1 sets - 10 reps Standing Hip Abduction - 1 x daily - 3 x weekly - 1 sets - 10 reps Standing Hip Extension - 1 x daily - 1 x weekly - 1 sets - 10 reps Mini Squat with Counter Support - 1 x daily - 1 x weekly - 1 sets - 10 reps

## 2020-08-21 NOTE — Therapy (Signed)
[place], [place], [address] Phone: [phone]   Fax:  [phone]  Physical Therapy Treatment  Patient Details  Name: Hannah [name] MRN: [id number] Date of Birth: 01/09/1961 Referring Provider (PT): Cira Rue   Encounter Date: 08/21/2020   PT End of Session - 08/21/20 0849    Visit Number 5    Number of Visits 12    Date for PT Re-Evaluation 09/16/20    Authorization Type BCBS    PT Start Time 0800    PT Stop Time 0845    PT Time Calculation (min) 45 min    Activity Tolerance Patient tolerated treatment well    Behavior During Therapy [facility] for tasks assessed/performed           Past Medical History:  Diagnosis Date  . Allergy   . Anemia   . Anxiety   . Arthritis   . Asthma   . Depression   . Hypertension     Past Surgical History:  Procedure Laterality Date  . CHOLECYSTECTOMY  1993  . COLONOSCOPY    . 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 TOTAL KNEE ARTHROPLASTY Right 01/28/2016   Procedure: RIGHT TOTAL KNEE ARTHROPLASTY;  Surgeon: [name], MD;  Location: [facility];  Service: Orthopedics;  Laterality: Right;  . TRIGGER FINGER RELEASE Right 06/18/2014   Procedure: RIGHT LONG FINGER TRIGGER RELEASE;  Surgeon: [name], MD;  Location: [place];  Service: Orthopedics;  Laterality: Right;  . TUBAL LIGATION    . VAGINAL HYSTERECTOMY Bilateral 11/04/2016   Procedure: HYSTERECTOMY VAGINAL with Bilateral Salpingectomy;  Surgeon: [name], MD;  Location: [facility];  Service: Gynecology;  Laterality: Bilateral;  . WEIL OSTEOTOMY Right 07/07/2020   Procedure: RIGHT FOOT WEIL OSTEOTOMY 2, 3, AND 4 METATARSALS AND PROXIMAL INTERPHALANGEAL JOINT FUSION 2 & 4 TOES;  Surgeon: Newt Minion, MD;  Location: [facility];  Service: Orthopedics;  Laterality: Right;    There were no vitals filed for this visit.   Subjective Assessment - 08/21/20 0759     Subjective Chemo is making me tired all the time.  I started back to using the cane several days ago.Nausea is better because I am taking meds. Pelvic pain is better because she started back on hydrocodone.  Having some pain in the right scapula area that started a few days ago. sometimes I have to lean over alot because I can't hold myself up. Pt is looking forward to going to the beach with her daughter next weekend.    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 She as diagnosed in February 2022 with Multiple Myeloma not in remission. She is presently having palliative radiation for her back and pelvic pain, and has had 1 treatment of Zometa.  She will start chemotherapy on 08/17/20, She fatigues very easily.  She is presently out of work and just got out of a post op shoe for her right foot.  She is using a SPC corrected now to use with left UE. 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 30 minutes and must move.  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 5 minutes or less impacted also by recent right foot surgery and fatigue.    Diagnostic tests MRI/PET Scan  Currently in Pain? Yes    Pain Score 4     Pain Location Scapula    Pain Orientation Right    Pain Descriptors / Indicators Aching;Constant    Pain Onset A few days ago    Pain Frequency Constant    Multiple Pain Sites No                             [facility] Adult PT Treatment/Exercise - 08/21/20 0001      Lumbar Exercises: Stretches   Other Lumbar Stretch Exercise Meeks decompression exercises - all x 5 with 3 sec holds      Lumbar Exercises: Standing   Heel Raises 10 reps   v/c to keep weight through all toes, holding back of bike     Lumbar Exercises: Seated   Sit to Stand 5 reps   1 set high mat table   Sit to Stand Limitations without UE       Lumbar Exercises: Supine   Pelvic Tilt 10 reps    Pelvic Tilt Limitations pt did not require v/c today for diaphragmatic breathing    Glut Set 10 reps;3 seconds    Clam 10 reps   yellow band held by PT   Heel Slides 5 reps   bilaterally.  holding tilt then sliding leg   Bent Knee Raise 10 reps   while maintaining pelvic tilt     Knee/Hip Exercises: Standing   Hip Abduction Stengthening;Both;2 sets;5 reps   holding back of bike   Abduction Limitations weaker on left    Hip Extension Stengthening;2 sets;5 reps    Extension Limitations fatigues easily    Functional Squat 2 sets;5 reps   holding bike.  no pain   Other Standing Knee Exercises standing march 2x 10   holding bike and walker     Shoulder Exercises: Seated   Extension Strengthening;Right;Left;5 reps    Theraband Level (Shoulder Extension) Level 1 (Yellow)    Row Strengthening;10 reps    Theraband Level (Shoulder Row) Level 1 (Yellow)                  PT Education - 08/21/20 0842    Education Details Pt was educated in standing heel raises, mini squat, AROM hip abd and hip ext all holding the couter for support at home.    Person(s) Educated Patient    Methods Explanation;Handout    Comprehension Returned demonstration            PT Short Term Goals - 05/19/20 1432      PT SHORT TERM GOAL #1   Title STG=LTG             PT Long Term Goals - 08/05/20 1208      PT LONG TERM GOAL #1   Title Pt will be independent with all HEP given    Baseline No knowledge    Time 6    Period Weeks    Status New    Target Date 09/16/20      PT LONG TERM GOAL #2   Title Pt will be able to perform household chores without exacerbating back pain and using pain managment techniques    Baseline Pt unable to sweep or wash dishes for longer than 10 minutes    Period Weeks    Status New    Target Date 09/16/20      PT LONG TERM GOAL #3   Title  Pt will be able to perform a 5 x sts in 20 sec or less showing improved LE  strength and control without pain    Baseline 55 sec baseline    Time 6    Period Weeks    Status New    Target Date 09/16/20      PT LONG TERM GOAL #4   Title Pt will improve TUG time to 13 seconds or less to decrease risk of falls    Baseline 55 sec at eval    Time 6    Period Weeks    Status New    Target Date 09/16/20      PT LONG TERM GOAL #5   Title Pt will be able to hold each position of 4 position balance test for atleast 10 seconds without LOB    Baseline unable to hold tandem or SLS (right SLS not tested today secondary to recent surgery)    Time 6    Period Weeks    Status New    Target Date 09/16/20                 Plan - 08/21/20 0849    Clinical Impression Statement Pt is very fatigued from chemo but nausea is better with meds.  We started with standing strength today, and ended with core stabs and then meeks decompression.  She did very well with exercises and uses good form.  Left LE considerably weaker with standing exercises and noticeably more difficult with ambulation today using her cane.  We tried switching her cane to the right hand today but returned to left hand because it bothered her right shoulder.  Several rest breaks were given throughout.  She fell asleep on 1 occasion while doing supine clams in the resisted position, but woke easily when spoken too. HEP was updated with several standing exs.    Personal Factors and Comorbidities Comorbidity 3+    Comorbidities Multiple Myeloma, multi joint OA, recent foot surgery, fall risk    Examination-Activity Limitations Sleep;Squat;Stand;Stairs;Transfers;Locomotion Level;Sit    Examination-Participation Restrictions Cleaning;Community Activity;Occupation;Shop    Stability/Clinical Decision Making Evolving/Moderate complexity    Rehab Potential Good    PT Frequency 2x / week    PT Duration 6 weeks    PT Treatment/Interventions ADLs/Self Care Home Management;Aquatic Therapy;Iontophoresis 10m/ml  Dexamethasone;Gait training;Stair training;Functional mobility training;Therapeutic activities;Therapeutic exercise;Balance training;Neuromuscular re-education;Manual techniques;Patient/family education;Passive range of motion;Joint Manipulations    PT Next Visit Plan Continue standing exs, cont. generalized strength, balance. mindful of radiation for back/pelvic/rib pain.  rest breaks prn    PT Home Exercise Plan meeks decompression, ppt, ppt with low march, standing heel raises, mini squats, hip abd, hip ext all at counter x 10 reps    Consulted and Agree with Plan of Care Patient           Patient will benefit from skilled therapeutic intervention in order to improve the following deficits and impairments:  Abnormal gait,Decreased mobility,Decreased activity tolerance,Decreased endurance,Decreased strength,Decreased balance,Decreased knowledge of precautions,Difficulty walking,Postural dysfunction,Pain  Visit Diagnosis: Muscle weakness (generalized)  Chronic low back pain with left-sided sciatica, unspecified back pain laterality  Fatigue, unspecified type  Difficulty in walking, not elsewhere classified  Multiple myeloma not having achieved remission (HCC)  Stiffness of left hip, not elsewhere classified  Cramp and spasm     Problem List Patient Active Problem List   Diagnosis Date Noted  . Multiple myeloma ([place] 07/13/2020  . Claw toe, right   . Trigger finger, left middle  finger 04/09/2020  . Stenosing tenosynovitis of finger of left hand 02/27/2020  . Hemoglobin C trait ([place]) 02/27/2018  . Major depression, recurrent ([place]) 01/12/2018  . Chronic right shoulder pain 12/20/2017  . MDD (major depressive disorder), recurrent episode, moderate ([place]) 11/04/2017  . MGUS (monoclonal gammopathy of unknown significance) 10/24/2017  . Primary osteoarthritis of both hands 09/26/2017  . Primary osteoarthritis of right shoulder 09/26/2017  . Primary osteoarthritis of both hips  09/26/2017  . Status post total knee replacement, right 09/26/2017  . Primary osteoarthritis of both feet 09/26/2017  . History of asthma 09/26/2017  . Primary osteoarthritis of left knee 04/18/2017  . Menorrhagia 11/04/2016  . Fibroid tumor 11/04/2016  . Adenomyosis 11/04/2016  . Hypertension, essential 11/04/2016  . Total knee replacement status 01/28/2016    Claris Pong 08/21/2020, 8:57 AM  [place], [place], [address] Phone: [phone]   Fax:  [phone]  Name: KASEY HANSELL MRN: [id number] Date of Birth: 19-Feb-1961 Cheral Almas, PT 08/21/20 8:59 AM

## 2020-08-24 ENCOUNTER — Other Ambulatory Visit: Payer: Self-pay

## 2020-08-24 ENCOUNTER — Inpatient Hospital Stay: Payer: BC Managed Care – PPO

## 2020-08-24 VITALS — BP 109/72 | HR 109 | Temp 99.0°F | Resp 18 | Wt 202.8 lb

## 2020-08-24 DIAGNOSIS — M1990 Unspecified osteoarthritis, unspecified site: Secondary | ICD-10-CM | POA: Diagnosis not present

## 2020-08-24 DIAGNOSIS — D472 Monoclonal gammopathy: Secondary | ICD-10-CM

## 2020-08-24 DIAGNOSIS — I10 Essential (primary) hypertension: Secondary | ICD-10-CM | POA: Diagnosis not present

## 2020-08-24 DIAGNOSIS — F419 Anxiety disorder, unspecified: Secondary | ICD-10-CM | POA: Diagnosis not present

## 2020-08-24 DIAGNOSIS — F32A Depression, unspecified: Secondary | ICD-10-CM | POA: Diagnosis not present

## 2020-08-24 DIAGNOSIS — C9000 Multiple myeloma not having achieved remission: Secondary | ICD-10-CM

## 2020-08-24 DIAGNOSIS — D509 Iron deficiency anemia, unspecified: Secondary | ICD-10-CM | POA: Diagnosis not present

## 2020-08-24 DIAGNOSIS — E669 Obesity, unspecified: Secondary | ICD-10-CM | POA: Diagnosis not present

## 2020-08-24 LAB — CMP (CANCER CENTER ONLY)
ALT: 31 U/L (ref 0–44)
AST: 21 U/L (ref 15–41)
Albumin: 3 g/dL — ABNORMAL LOW (ref 3.5–5.0)
Alkaline Phosphatase: 55 U/L (ref 38–126)
Anion gap: 13 (ref 5–15)
BUN: 15 mg/dL (ref 6–20)
CO2: 31 mmol/L (ref 22–32)
Calcium: 8.5 mg/dL — ABNORMAL LOW (ref 8.9–10.3)
Chloride: 94 mmol/L — ABNORMAL LOW (ref 98–111)
Creatinine: 1.02 mg/dL — ABNORMAL HIGH (ref 0.44–1.00)
GFR, Estimated: >60 mL/min (ref >60)
Glucose, Bld: 100 mg/dL — ABNORMAL HIGH (ref 70–99)
Potassium: 3.5 mmol/L (ref 3.5–5.1)
Sodium: 138 mmol/L (ref 135–145)
Total Bilirubin: 0.5 mg/dL (ref 0.3–1.2)
Total Protein: 9.3 g/dL — ABNORMAL HIGH (ref 6.5–8.1)

## 2020-08-24 LAB — CBC WITH DIFFERENTIAL (CANCER CENTER ONLY)
Abs Immature Granulocytes: 0.02 10*3/uL (ref 0.00–0.07)
Basophils Absolute: 0 10*3/uL (ref 0.0–0.1)
Basophils Relative: 0 %
Eosinophils Absolute: 0.1 10*3/uL (ref 0.0–0.5)
Eosinophils Relative: 5 %
HCT: 27.7 % — ABNORMAL LOW (ref 36.0–46.0)
Hemoglobin: 9.2 g/dL — ABNORMAL LOW (ref 12.0–15.0)
Immature Granulocytes: 1 %
Lymphocytes Relative: 10 %
Lymphs Abs: 0.2 10*3/uL — ABNORMAL LOW (ref 0.7–4.0)
MCH: 24 pg — ABNORMAL LOW (ref 26.0–34.0)
MCHC: 33.2 g/dL (ref 30.0–36.0)
MCV: 72.3 fL — ABNORMAL LOW (ref 80.0–100.0)
Monocytes Absolute: 0.1 10*3/uL (ref 0.1–1.0)
Monocytes Relative: 5 %
Neutro Abs: 1.8 10*3/uL (ref 1.7–7.7)
Neutrophils Relative %: 79 %
Platelet Count: 105 10*3/uL — ABNORMAL LOW (ref 150–400)
RBC: 3.83 MIL/uL — ABNORMAL LOW (ref 3.87–5.11)
RDW: 18.1 % — ABNORMAL HIGH (ref 11.5–15.5)
WBC Count: 2.3 10*3/uL — ABNORMAL LOW (ref 4.0–10.5)
nRBC: 0 % (ref 0.0–0.2)

## 2020-08-24 MED ORDER — DEXAMETHASONE 4 MG PO TABS
20.0000 mg | ORAL_TABLET | Freq: Once | ORAL | Status: AC
  Administered 2020-08-24: 20 mg via ORAL

## 2020-08-24 MED ORDER — DEXAMETHASONE 4 MG PO TABS
ORAL_TABLET | ORAL | Status: AC
  Filled 2020-08-24: qty 5

## 2020-08-24 MED ORDER — BORTEZOMIB CHEMO SQ INJECTION 3.5 MG (2.5MG/ML)
1.3000 mg/m2 | Freq: Once | INTRAMUSCULAR | Status: AC
  Administered 2020-08-24: 2.75 mg via SUBCUTANEOUS
  Filled 2020-08-24: qty 1.1

## 2020-08-24 NOTE — Progress Notes (Signed)
MD states okay to treat with elevated HR  After speaking with MD and Pharmacy, reinforced pt to start OTC calcium tablets 6600mg  per MD. Pt verbalized understanding and denies questions at this time

## 2020-08-24 NOTE — Patient Instructions (Signed)
[facility] Discharge Instructions for Patients Receiving Chemotherapy  Today you received the following chemotherapy agents Bortezimib(Velcade injection)   To help prevent nausea and vomiting after your treatment, we encourage you to take your nausea medication as directed.   If you develop nausea and vomiting that is not controlled by your nausea medication, call the clinic.   BELOW ARE SYMPTOMS THAT SHOULD BE REPORTED IMMEDIATELY:  *FEVER GREATER THAN 100.5 F  *CHILLS WITH OR WITHOUT FEVER  NAUSEA AND VOMITING THAT IS NOT CONTROLLED WITH YOUR NAUSEA MEDICATION  *UNUSUAL SHORTNESS OF BREATH  *UNUSUAL BRUISING OR BLEEDING  TENDERNESS IN MOUTH AND THROAT WITH OR WITHOUT PRESENCE OF ULCERS  *URINARY PROBLEMS  *BOWEL PROBLEMS  UNUSUAL RASH Items with * indicate a potential emergency and should be followed up as soon as possible.  Feel free to call the clinic should you have any questions or concerns. The clinic phone number is [phone].  Please show the [place] at check-in to the Emergency Department and triage nurse.

## 2020-08-25 ENCOUNTER — Ambulatory Visit: Payer: BC Managed Care – PPO | Admitting: Physical Therapy

## 2020-08-26 ENCOUNTER — Ambulatory Visit: Payer: BC Managed Care – PPO | Admitting: Orthopedic Surgery

## 2020-08-28 ENCOUNTER — Other Ambulatory Visit: Payer: Self-pay

## 2020-08-28 ENCOUNTER — Ambulatory Visit: Payer: BC Managed Care – PPO | Attending: Orthopaedic Surgery

## 2020-08-28 DIAGNOSIS — C9000 Multiple myeloma not having achieved remission: Secondary | ICD-10-CM

## 2020-08-28 DIAGNOSIS — M25652 Stiffness of left hip, not elsewhere classified: Secondary | ICD-10-CM

## 2020-08-28 DIAGNOSIS — M6281 Muscle weakness (generalized): Secondary | ICD-10-CM | POA: Diagnosis not present

## 2020-08-28 DIAGNOSIS — R252 Cramp and spasm: Secondary | ICD-10-CM | POA: Diagnosis not present

## 2020-08-28 DIAGNOSIS — R5383 Other fatigue: Secondary | ICD-10-CM | POA: Diagnosis not present

## 2020-08-28 DIAGNOSIS — R262 Difficulty in walking, not elsewhere classified: Secondary | ICD-10-CM

## 2020-08-28 DIAGNOSIS — M5442 Lumbago with sciatica, left side: Secondary | ICD-10-CM | POA: Insufficient documentation

## 2020-08-28 DIAGNOSIS — G8929 Other chronic pain: Secondary | ICD-10-CM

## 2020-08-28 NOTE — Progress Notes (Signed)
[place]   [phone] [phone]   Clinic Follow up Note   Patient Care Team: Tisovec, Fransico Him, MD as PCP - General (Internal Medicine) Bo Merino, MD as Consulting Physician (Rheumatology)  Date of Service:  08/31/2020  CHIEF COMPLAINT: Follow-upof multiple myeloma  SUMMARY OF ONCOLOGIC HISTORY: Oncology History Overview Note  Cancer Staging Multiple myeloma ([place]) Staging form: Plasma Cell Myeloma and Plasma Cell Disorders, AJCC 8th Edition - Clinical stage from 07/20/2020: Beta-2-microglobulin (mg/L): 2.4, Albumin (g/dL): 3.2, ISS: Stage II, High-risk cytogenetics: Unknown, LDH: Normal - Signed by Truitt Merle, MD on 08/02/2020 Beta 2 microglobulin range (mg/L): Less than 3.5 Albumin range (g/dL): Less than 3.5 Cytogenetics: Unknown    Multiple myeloma ([place])  07/03/2020 Imaging   MRI Lumbar Spine  IMPRESSION: 1. Numerous T1 hypointense and STIR hyperintense lesions throughout the visualized spine and sacrum with multiple areas of extraosseous extension in the sacrum, detailed above and compatible with multiple myeloma given the clinical history. 2. An MRI of the pelvis with contrast could evaluate the full extent of the partially imaged sacral lesions, including left S1-S2 neural foraminal involvement and suspected involvement of the exited left L5 nerve. 3. Multilevel degenerative change without significant canal or foraminal stenosis in the lumbar spine.   07/13/2020 Initial Diagnosis   Multiple myeloma ([name])   07/20/2020 Cancer Staging   Staging form: Plasma Cell Myeloma and Plasma Cell Disorders, AJCC 8th Edition - Clinical stage from 07/20/2020: Beta-2-microglobulin (mg/L): 2.4, Albumin (g/dL): 3.2, ISS: Stage II, High-risk cytogenetics: Unknown, LDH: Normal - Signed by Truitt Merle, MD on 08/31/2020 Beta 2 microglobulin range (mg/L): Less than 3.5 Albumin range (g/dL): Less than 3.5 Cytogenetics: No abnormalities Bone disease on  imaging: Present   07/24/2020 PET scan   IMPRESSION: 1. Moderate to high hypermetabolic activity associated with the expansile soft tissue masses within the LEFT and RIGHT pelvic bones is consistent with active multiple myeloma / plasmacytoma. 2. Lytic lesion in the LEFT scapula with mild metabolic activity is indeterminate. 3. Metabolic activity associated with the RIGHT knee prosthetic and RIGHT distal foot fusion is favored post procedural inflammation.     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  The findings are most consistent with  persistent/recurrent/previously known plasma cell neoplasm.  For  completeness, immunohistochemical stain for CD138 and in situ  hybridization for kappa and lambda will be performed and the results  reported in an addendum.  Correlation with cytogenetic and FISH studies  is recommended.   08/03/2020 -  Chemotherapy   Zometa starting 08/03/20    08/17/2020 -  Chemotherapy   PENDING Velcade weekly, Revlimid, dex $RemoveBefor'20mg'tsUgvpeECYJZ$  weekly (VRd)  Starting 08/17/20.        CURRENT THERAPY:  Zometa starting 08/03/20 Velcade, Revlimid, dex (VRd) 2 weeks on/1 week off starting 08/17/20.   INTERVAL HISTORY Hannah [name] is here for a follow up. She presents to the clinic alone in a wheelchair.  She started chemotherapy 2 weeks ago, her  main complaint is moderate fatigue.  She is still able to function at home, but not physically active,  and takes naps at home.  Her pain overall is well controlled, she takes hydrocodone 3 times a day.  She works with a cane, no recent fall.  Her appetite is fair, weight is stable.  No fever, chills, bleeding, or other new complaints.  All other systems were reviewed with the patient and are negative.  MEDICAL HISTORY:  Past Medical History:  Diagnosis Date  . Allergy   . Anemia   . Anxiety   . Arthritis   . Asthma   . Depression   . Hypertension     SURGICAL HISTORY: Past Surgical History:  Procedure Laterality Date  . CHOLECYSTECTOMY  1993  . COLONOSCOPY    . 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 TOTAL KNEE ARTHROPLASTY Right 01/28/2016   Procedure: RIGHT TOTAL KNEE ARTHROPLASTY;  Surgeon: Tarry Kos, MD;  Location: [facility];  Service: Orthopedics;  Laterality: Right;  . TRIGGER FINGER RELEASE Right 06/18/2014   Procedure: RIGHT LONG FINGER TRIGGER RELEASE;  Surgeon: Cheral Almas, MD;  Location: [facility];  Service: Orthopedics;  Laterality: Right;  . TUBAL LIGATION    . VAGINAL HYSTERECTOMY Bilateral 11/04/2016   Procedure: HYSTERECTOMY VAGINAL with Bilateral Salpingectomy;  Surgeon: Hal Morales, MD;  Location: [facility];  Service: Gynecology;  Laterality: Bilateral;  . WEIL OSTEOTOMY Right 07/07/2020   Procedure: RIGHT FOOT WEIL OSTEOTOMY 2, 3, AND 4 METATARSALS AND PROXIMAL INTERPHALANGEAL JOINT FUSION 2 & 4 TOES;  Surgeon: Nadara Mustard, MD;  Location: [facility];  Service: Orthopedics;  Laterality: Right;    I have reviewed the social history and family history with the patient and they are unchanged from previous note.  ALLERGIES:  is allergic to elemental sulfur.  MEDICATIONS:  Current Outpatient Medications  Medication Sig Dispense Refill  . albuterol (VENTOLIN HFA) 108 (90 Base) MCG/ACT inhaler Inhale into the lungs.    . budesonide-formoterol (SYMBICORT) 160-4.5 MCG/ACT inhaler Inhale 2 puffs into the lungs daily as needed.     . calcium carbonate (OSCAL) 1500 (600 Ca) MG TABS tablet Take 600 mg of elemental calcium by mouth daily.    . chlorthalidone (HYGROTON) 25 MG tablet TAKE 1 TABLET BY MOUTH EVERY DAY FOR HTN  3  . Cholecalciferol (VITAMIN D3) 1.25 MG (50000 UT) CAPS Take 1 capsule by mouth once a week.    . ferrous sulfate 325 (65 FE) MG tablet Take 325 mg by mouth 2 (two) times daily with a meal.    . HYDROcodone-acetaminophen (NORCO) 5-325 MG tablet Take 1-2 tablets by mouth every 8 (eight) hours as needed for severe pain. 60 tablet 0  . lenalidomide (REVLIMID) 25 MG capsule Take 1 tablet daily for 21 days, then no revlimid for 7 days [employer] Auth # [id number]    Date Obtained 07/22/2020 21 capsule 0  . ondansetron (ZOFRAN) 8 MG tablet Take 1 tablet (8 mg total) by mouth every 8 (eight) hours as needed for nausea or vomiting. 20 tablet 3  . pantoprazole (PROTONIX) 20 MG tablet Take 1 tablet (20 mg total) by mouth daily. 30 tablet 0  . potassium chloride SA (KLOR-CON) 20 MEQ tablet Take 1 tablet (20 mEq total) by mouth 2 (two) times daily. 60 tablet 0  . valACYclovir (VALTREX) 500 MG tablet Take 500 mg by mouth 2 (two) times daily.    . Vitamin D, Ergocalciferol, (DRISDOL) 50000 units  CAPS capsule Take 50,000 Units by mouth every Monday.      No current facility-administered medications for this visit.    PHYSICAL EXAMINATION: ECOG PERFORMANCE STATUS: 3 - Symptomatic, >50% confined to bed  Vitals:   08/31/20 0837  BP: 105/69  Pulse: (!) 104  Resp: 19  Temp: 97.6 F (36.4 C)  SpO2: 95%   Filed Weights   08/31/20 0837  Weight: 201 lb 11.2 oz (91.5 kg)    GENERAL:alert, no distress and comfortable SKIN: skin color, texture, turgor are normal, no rashes or significant lesions EYES: normal, Conjunctiva are pink and non-injected, sclera clear NECK: supple, thyroid normal size, non-tender, without nodularity LYMPH:  no palpable lymphadenopathy in the cervical, axillary  LUNGS: clear to auscultation and  percussion with normal breathing effort HEART: regular rate & rhythm and no murmurs and no lower extremity edema ABDOMEN:abdomen soft, non-tender and normal bowel sounds Musculoskeletal:no cyanosis of digits and no clubbing  NEURO: alert & oriented x 3 with fluent speech, no focal motor/sensory deficits  LABORATORY DATA:  I have reviewed the data as listed CBC Latest Ref Rng & Units 08/31/2020 08/24/2020 08/17/2020  WBC 4.0 - 10.5 K/uL 2.0(L) 2.3(L) 5.2  Hemoglobin 12.0 - 15.0 g/dL 9.3(L) 9.2(L) 9.5(L)  Hematocrit 36.0 - 46.0 % 28.1(L) 27.7(L) 28.1(L)  Platelets 150 - 400 K/uL 113(L) 105(L) 128(L)     CMP Latest Ref Rng & Units 08/31/2020 08/24/2020 08/17/2020  Glucose 70 - 99 mg/dL 102(H) 100(H) 113(H)  BUN 6 - 20 mg/dL 13 15 38(H)  Creatinine 0.44 - 1.00 mg/dL 0.82 1.02(H) 1.11(H)  Sodium 135 - 145 mmol/L 141 138 137  Potassium 3.5 - 5.1 mmol/L 3.0(L) 3.5 3.0(L)  Chloride 98 - 111 mmol/L 94(L) 94(L) 97(L)  CO2 22 - 32 mmol/L 32 31 27  Calcium 8.9 - 10.3 mg/dL 8.8(L) 8.5(L) 7.9(L)  Total Protein 6.5 - 8.1 g/dL 8.4(H) 9.3(H) 10.0(H)  Total Bilirubin 0.3 - 1.2 mg/dL 0.5 0.5 0.5  Alkaline Phos 38 - 126 U/L 60 55 62  AST 15 - 41 U/L 19 21 17   ALT 0 - 44 U/L 29 31 30       RADIOGRAPHIC STUDIES: I have personally reviewed the radiological images as listed and agreed with the findings in the report. No results found.   ASSESSMENT & PLAN:  Hannah [name] is a 60 y.o. 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 I previouslydiscussed with pathologist Dr. Gari Crown, the increased plasma cells in bone marrow is likely between 10 to 20%, and would favorsmolderingmultiple myeloma than MGUS. She initially did not meet CRAB Criteria for MM.  -After increase of IgA, light chain and  M-protein (3.3) on 06/01/20 labs, multiple T1 lesions in her lumbarspine and sacrum on 07/03/20 MRI, hypermetabolic uptake in multiple bone lesion on 07/24/20 PET, she meets the diagnostic criteria for multiple myeloma -She underwent a bone marrow biopsy last week, which showed 31% plasma cells.  Cytology and FISH was negative gene mutations, which indicating standard risk disease. I discussed with her today.  -She has completed palliative radiation to sacral bone lesions -She has started induction which chemotherapy with VRd (Velcade, Revlimid and dexamethasone), she is a candidate for bone marrow transplant.  We will refer [facility] to [facility] bone marrow transplant program for evaluation. -Due to her significant  fatigue and cytopenias I will change Revlimid to 2 weeks on, and 1 week off -She continues to lose weight, I will refer her to dietitian   2. Low back pain, right hip pain, Mid right back pain, secondary to MM -During work up with orthopedic surgeon, she was found to have multiple T1 lesions of her spine and sacrum compatible with MM on her 07/03/20 MRI lumbar spine. These were hypermetabolic on 10/01/37 PET.  -For pain management she is on Norco as needed -She has completed her course of palliative radiation, pain is much improved. -She started Zometa on 08/03/20 to help strengthen her bones.  We will continue monthly.  She will continue calcium and vitamin D supplement   3. 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  -Hemoglobin electrophoresisshowed increase in Hg C, consistent with Hg C trait. I discussed the results with her and informed her that her children are at risk ofhemoglobin C trait, and I encouraged him to be tested. -Anemia overall mild and stable.Continue oral ironBID.  4. HTN, OA, Obesity, Anxiety, depression -She will continue to f/u with PCP, Chiropractor and Ortho. -For Anxiety and  depression she is under care of [facility] provider. Mood stable on Zoloft   PLAN: -Lab reviewed, adequate for treatment, will proceed weekly dexamethasone 20 mg and Velcade injection -Due to her fatigue and cytopenias, will change Revlimid to 2 weeks on, 1 week off. She will start one week off of first cycle today, and start cycle 2 next Monday  -f/u in 2 weeks  -Dietitian referral for weight loss -Refer to [facility] pulmonary transplant program   No problem-specific Assessment & Plan notes found for this encounter.   Orders Placed This Encounter  Procedures  . Ambulatory Referral to [facility] Nutrition    Referral Priority:   Urgent    Referral Type:   Consultation    Referral Reason:   Specialty Services Required    Number of Visits Requested:   1  . Ambulatory referral to Hematology / Oncology    Referral Priority:   Routine    Referral Type:   Consultation    Referral Reason:   Specialty Services Required    Requested Specialty:   Oncology    Number of Visits Requested:   1   All questions were answered. The patient knows to call the clinic with any problems, questions or concerns. No barriers to learning was detected. The total time spent in the appointment was 30 minutes.     Truitt Merle, MD 08/31/2020   I, Joslyn Devon, am acting as scribe for Truitt Merle, MD.   I have reviewed the above documentation for accuracy and completeness, and I agree with the above.

## 2020-08-28 NOTE — Therapy (Signed)
[place], [place], [address] Phone: [phone]   Fax:  [phone]  Physical Therapy Treatment  Patient Details  Name: Hannah [name] MRN: [id number] Date of Birth: 25-Jul-1960 Referring Provider (PT): Cira Rue   Encounter Date: 08/28/2020   PT End of Session - 08/28/20 0849    Visit Number 6    Number of Visits 12    Date for PT Re-Evaluation 09/16/20    Authorization Type BCBS    PT Start Time 0801    PT Stop Time 0844    PT Time Calculation (min) 43 min    Activity Tolerance Patient limited by fatigue    Behavior During Therapy [facility] for tasks assessed/performed   fatigued          Past Medical History:  Diagnosis Date  . Allergy   . Anemia   . Anxiety   . Arthritis   . Asthma   . Depression   . Hypertension     Past Surgical History:  Procedure Laterality Date  . CHOLECYSTECTOMY  1993  . COLONOSCOPY    . 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 TOTAL KNEE ARTHROPLASTY Right 01/28/2016   Procedure: RIGHT TOTAL KNEE ARTHROPLASTY;  Surgeon: [name], MD;  Location: [facility];  Service: Orthopedics;  Laterality: Right;  . TRIGGER FINGER RELEASE Right 06/18/2014   Procedure: RIGHT LONG FINGER TRIGGER RELEASE;  Surgeon: [name], MD;  Location: [facility];  Service: Orthopedics;  Laterality: Right;  . TUBAL LIGATION    . VAGINAL HYSTERECTOMY Bilateral 11/04/2016   Procedure: HYSTERECTOMY VAGINAL with Bilateral Salpingectomy;  Surgeon: [name], MD;  Location: [facility];  Service: Gynecology;  Laterality: Bilateral;  . WEIL OSTEOTOMY Right 07/07/2020   Procedure: RIGHT FOOT WEIL OSTEOTOMY 2, 3, AND 4 METATARSALS AND PROXIMAL INTERPHALANGEAL JOINT FUSION 2 & 4 TOES;  Surgeon: Newt Minion, MD;  Location: [facility];  Service: Orthopedics;  Laterality: Right;    There were no vitals filed for this visit.   Subjective Assessment - 08/28/20  0801    Subjective Did OK after last visit.  I am really fatigued right now.  The hydrocodone I take for pain makes me really tired. I slept in the parking lot for 45 min this am.  Still having pain in LB/pelvic area.  Take chemo pill daily and have infusions on Monday.    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 She as diagnosed in February 2022 with Multiple Myeloma not in remission. She is presently having palliative radiation for her back and pelvic pain, and has had 1 treatment of Zometa.  She will start chemotherapy on 08/17/20, She fatigues very easily.  She is presently out of work and just got out of a post op shoe for her right foot.  She is using a SPC corrected now to use with left UE. 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 30 minutes and must move.  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 5 minutes or less impacted also by recent right foot surgery and fatigue.    How long can you walk comfortably? not much walking    Diagnostic tests MRI/PET Scan    Patient Stated Goals to stay strong and stay mobile, stay positive.  Currently in Pain? Yes    Pain Score 6     Pain Location Back    Pain Orientation Right    Pain Descriptors / Indicators Aching;Sore    Pain Type Chronic pain    Pain Onset More than a month ago    Pain Frequency Constant    Multiple Pain Sites No                             [facility] Adult PT Treatment/Exercise - 08/28/20 0001      Lumbar Exercises: Stretches   Other Lumbar Stretch Exercise Meeks decompression exercises - all x 5 with 3 sec holds      Lumbar Exercises: Standing   Heel Raises 15 reps   v/c to keep weight through all toes, holding back of bike     Lumbar Exercises: Seated   Sit to Stand 10 reps   high table with rest between sets of 5   Sit to Stand  Limitations without UE      Lumbar Exercises: Supine   Pelvic Tilt 10 reps    Pelvic Tilt Limitations pt did not require v/c today for diaphragmatic breathing    Clam 20 reps   yellow band held by PT; done in sets of 5   Bent Knee Raise 10 reps   while maintaining pelvic tilt done in 2 sets of 5 with alternate arm raise   Other Supine Lumbar Exercises ppt with alternate shoulder flexion x 5    Other Supine Lumbar Exercises pelvic tilt with ball squeeze with 3 sec hold 2 x 5      Knee/Hip Exercises: Standing   Knee Flexion Strengthening;Both;1 set;10 reps   holding on to counter for support   Knee Flexion Limitations fatigues easily    Forward Step Up 5 reps;Right;Left   bilateral UE hand hold 2-3 in ch step   Forward Step Up Limitations held secondary to pain      Shoulder Exercises: Seated   Extension Strengthening;Right;Left;5 reps    Theraband Level (Shoulder Extension) Level 1 (Yellow)    Row Strengthening;Both;15 reps    Theraband Level (Shoulder Row) Level 1 (Yellow)    Other Seated Exercises elbow flexion Bilateral 2# 2 x 10    Other Seated Exercises elbow extension x 10 yellow band                    PT Short Term Goals - 05/19/20 1432      PT SHORT TERM GOAL #1   Title STG=LTG             PT Long Term Goals - 08/05/20 1208      PT LONG TERM GOAL #1   Title Pt will be independent with all HEP given    Baseline No knowledge    Time 6    Period Weeks    Status New    Target Date 09/16/20      PT LONG TERM GOAL #2   Title Pt will be able to perform household chores without exacerbating back pain and using pain managment techniques    Baseline Pt unable to sweep or wash dishes for longer than 10 minutes    Period Weeks    Status New    Target Date 09/16/20      PT LONG TERM GOAL #3   Title Pt will be able to perform a 5 x sts in 20 sec  or less showing improved LE strength and control without pain    Baseline 55 sec baseline    Time 6    Period  Weeks    Status New    Target Date 09/16/20      PT LONG TERM GOAL #4   Title Pt will improve TUG time to 13 seconds or less to decrease risk of falls    Baseline 55 sec at eval    Time 6    Period Weeks    Status New    Target Date 09/16/20      PT LONG TERM GOAL #5   Title Pt will be able to hold each position of 4 position balance test for atleast 10 seconds without LOB    Baseline unable to hold tandem or SLS (right SLS not tested today secondary to recent surgery)    Time 6    Period Weeks    Status New    Target Date 09/16/20                 Plan - 08/28/20 0849    Clinical Impression Statement Pt continues to be very fatigued from pain meds she is taking for her LB.  She was advised to discuss pain with MD on Monday. We again started with standing strength but pt was very limited by fatigue and difficulty standing so switched to sitting and supine exercises.  She was given several breaks throughout.  She continues to ambulate very slowly and with increased effort using her cane. She did well with upper body exercises but fatigued with fewer reps of lower body exercises. 2 in step ups required bilateral UE hand hold and was very difficult for pt.    Personal Factors and Comorbidities Comorbidity 3+    Comorbidities Multiple Myeloma, multi joint OA, recent foot surgery, fall risk    Examination-Activity Limitations Sleep;Squat;Stand;Stairs;Transfers;Locomotion Level;Sit    Examination-Participation Restrictions Cleaning;Community Activity;Occupation;Shop    Stability/Clinical Decision Making Evolving/Moderate complexity    Rehab Potential Good    PT Frequency 2x / week    PT Duration 6 weeks    PT Treatment/Interventions ADLs/Self Care Home Management;Aquatic Therapy;Iontophoresis 4mg /ml Dexamethasone;Gait training;Stair training;Functional mobility training;Therapeutic activities;Therapeutic exercise;Balance training;Neuromuscular re-education;Manual  techniques;Patient/family education;Passive range of motion;Joint Manipulations    PT Next Visit Plan Continue standing exs, cont. generalized strength, balance. mindful of radiation for back/pelvic/rib pain.  rest breaks prn    PT Home Exercise Plan meeks decompression, ppt, ppt with low march, standing heel raises, mini squats, hip abd, hip ext all at counter x 10 reps    Consulted and Agree with Plan of Care Patient           Patient will benefit from skilled therapeutic intervention in order to improve the following deficits and impairments:  Abnormal gait,Decreased mobility,Decreased activity tolerance,Decreased endurance,Decreased strength,Decreased balance,Decreased knowledge of precautions,Difficulty walking,Postural dysfunction,Pain  Visit Diagnosis: Muscle weakness (generalized)  Chronic low back pain with left-sided sciatica, unspecified back pain laterality  Fatigue, unspecified type  Difficulty in walking, not elsewhere classified  Multiple myeloma not having achieved remission (HCC)  Stiffness of left hip, not elsewhere classified  Cramp and spasm     Problem List Patient Active Problem List   Diagnosis Date Noted  . Multiple myeloma ([place]) 07/13/2020  . Claw toe, right   . Trigger finger, left middle finger 04/09/2020  . Stenosing tenosynovitis of finger of left hand 02/27/2020  . Hemoglobin C trait ([place]) 02/27/2018  . Major depression, recurrent ([place]) 01/12/2018  .  Chronic right shoulder pain 12/20/2017  . MDD (major depressive disorder), recurrent episode, moderate ([place]) 11/04/2017  . MGUS (monoclonal gammopathy of unknown significance) 10/24/2017  . Primary osteoarthritis of both hands 09/26/2017  . Primary osteoarthritis of right shoulder 09/26/2017  . Primary osteoarthritis of both hips 09/26/2017  . Status post total knee replacement, right 09/26/2017  . Primary osteoarthritis of both feet 09/26/2017  . History of asthma 09/26/2017  . Primary  osteoarthritis of left knee 04/18/2017  . Menorrhagia 11/04/2016  . Fibroid tumor 11/04/2016  . Adenomyosis 11/04/2016  . Hypertension, essential 11/04/2016  . Total knee replacement status 01/28/2016    Claris Pong 08/28/2020, 8:56 AM  [place], [place], [address] Phone: [phone]   Fax:  [phone]  Name: Hannah [name] MRN: [id number] Date of Birth: 03-Sep-1960 Cheral Almas, PT 08/28/20 8:58 AM

## 2020-08-31 ENCOUNTER — Telehealth: Payer: Self-pay | Admitting: Hematology

## 2020-08-31 ENCOUNTER — Ambulatory Visit: Payer: BC Managed Care – PPO | Admitting: Orthopedic Surgery

## 2020-08-31 ENCOUNTER — Inpatient Hospital Stay: Payer: BC Managed Care – PPO | Attending: Hematology

## 2020-08-31 ENCOUNTER — Other Ambulatory Visit: Payer: Self-pay

## 2020-08-31 ENCOUNTER — Inpatient Hospital Stay: Payer: BC Managed Care – PPO

## 2020-08-31 ENCOUNTER — Inpatient Hospital Stay (HOSPITAL_BASED_OUTPATIENT_CLINIC_OR_DEPARTMENT_OTHER): Payer: BC Managed Care – PPO | Admitting: Hematology

## 2020-08-31 ENCOUNTER — Encounter: Payer: Self-pay | Admitting: Hematology

## 2020-08-31 VITALS — HR 86

## 2020-08-31 VITALS — BP 105/69 | HR 104 | Temp 97.6°F | Resp 19 | Ht 66.0 in | Wt 201.7 lb

## 2020-08-31 DIAGNOSIS — I10 Essential (primary) hypertension: Secondary | ICD-10-CM | POA: Insufficient documentation

## 2020-08-31 DIAGNOSIS — Z5112 Encounter for antineoplastic immunotherapy: Secondary | ICD-10-CM | POA: Diagnosis not present

## 2020-08-31 DIAGNOSIS — C9000 Multiple myeloma not having achieved remission: Secondary | ICD-10-CM

## 2020-08-31 DIAGNOSIS — D472 Monoclonal gammopathy: Secondary | ICD-10-CM

## 2020-08-31 DIAGNOSIS — M1990 Unspecified osteoarthritis, unspecified site: Secondary | ICD-10-CM | POA: Diagnosis not present

## 2020-08-31 DIAGNOSIS — D509 Iron deficiency anemia, unspecified: Secondary | ICD-10-CM | POA: Diagnosis not present

## 2020-08-31 DIAGNOSIS — F32A Depression, unspecified: Secondary | ICD-10-CM | POA: Diagnosis not present

## 2020-08-31 DIAGNOSIS — E669 Obesity, unspecified: Secondary | ICD-10-CM | POA: Insufficient documentation

## 2020-08-31 DIAGNOSIS — F419 Anxiety disorder, unspecified: Secondary | ICD-10-CM | POA: Diagnosis not present

## 2020-08-31 LAB — CMP (CANCER CENTER ONLY)
ALT: 29 U/L (ref 0–44)
AST: 19 U/L (ref 15–41)
Albumin: 3.2 g/dL — ABNORMAL LOW (ref 3.5–5.0)
Alkaline Phosphatase: 60 U/L (ref 38–126)
Anion gap: 15 (ref 5–15)
BUN: 13 mg/dL (ref 6–20)
CO2: 32 mmol/L (ref 22–32)
Calcium: 8.8 mg/dL — ABNORMAL LOW (ref 8.9–10.3)
Chloride: 94 mmol/L — ABNORMAL LOW (ref 98–111)
Creatinine: 0.82 mg/dL (ref 0.44–1.00)
GFR, Estimated: >60 mL/min (ref >60)
Glucose, Bld: 102 mg/dL — ABNORMAL HIGH (ref 70–99)
Potassium: 3 mmol/L — ABNORMAL LOW (ref 3.5–5.1)
Sodium: 141 mmol/L (ref 135–145)
Total Bilirubin: 0.5 mg/dL (ref 0.3–1.2)
Total Protein: 8.4 g/dL — ABNORMAL HIGH (ref 6.5–8.1)

## 2020-08-31 LAB — CBC WITH DIFFERENTIAL (CANCER CENTER ONLY)
Abs Immature Granulocytes: 0.03 10*3/uL (ref 0.00–0.07)
Basophils Absolute: 0 10*3/uL (ref 0.0–0.1)
Basophils Relative: 1 %
Eosinophils Absolute: 0.1 10*3/uL (ref 0.0–0.5)
Eosinophils Relative: 5 %
HCT: 28.1 % — ABNORMAL LOW (ref 36.0–46.0)
Hemoglobin: 9.3 g/dL — ABNORMAL LOW (ref 12.0–15.0)
Immature Granulocytes: 2 %
Lymphocytes Relative: 16 %
Lymphs Abs: 0.3 10*3/uL — ABNORMAL LOW (ref 0.7–4.0)
MCH: 24 pg — ABNORMAL LOW (ref 26.0–34.0)
MCHC: 33.1 g/dL (ref 30.0–36.0)
MCV: 72.6 fL — ABNORMAL LOW (ref 80.0–100.0)
Monocytes Absolute: 0.3 10*3/uL (ref 0.1–1.0)
Monocytes Relative: 12 %
Neutro Abs: 1.3 10*3/uL — ABNORMAL LOW (ref 1.7–7.7)
Neutrophils Relative %: 64 %
Platelet Count: 113 10*3/uL — ABNORMAL LOW (ref 150–400)
RBC: 3.87 MIL/uL (ref 3.87–5.11)
RDW: 18.2 % — ABNORMAL HIGH (ref 11.5–15.5)
WBC Count: 2 10*3/uL — ABNORMAL LOW (ref 4.0–10.5)
nRBC: 0 % (ref 0.0–0.2)

## 2020-08-31 MED ORDER — ZOLEDRONIC ACID 4 MG/100ML IV SOLN
4.0000 mg | Freq: Once | INTRAVENOUS | Status: AC
  Administered 2020-08-31: 4 mg via INTRAVENOUS

## 2020-08-31 MED ORDER — PROCHLORPERAZINE MALEATE 10 MG PO TABS
ORAL_TABLET | ORAL | Status: AC
  Filled 2020-08-31: qty 1

## 2020-08-31 MED ORDER — SODIUM CHLORIDE 0.9 % IV SOLN
Freq: Once | INTRAVENOUS | Status: AC
  Filled 2020-08-31: qty 250

## 2020-08-31 MED ORDER — DEXAMETHASONE 4 MG PO TABS
ORAL_TABLET | ORAL | Status: AC
  Filled 2020-08-31: qty 5

## 2020-08-31 MED ORDER — BORTEZOMIB CHEMO SQ INJECTION 3.5 MG (2.5MG/ML)
1.3000 mg/m2 | Freq: Once | INTRAMUSCULAR | Status: AC
  Administered 2020-08-31: 2.75 mg via SUBCUTANEOUS
  Filled 2020-08-31: qty 1.1

## 2020-08-31 MED ORDER — ZOLEDRONIC ACID 4 MG/100ML IV SOLN
INTRAVENOUS | Status: AC
  Filled 2020-08-31: qty 100

## 2020-08-31 MED ORDER — DEXAMETHASONE 4 MG PO TABS
20.0000 mg | ORAL_TABLET | Freq: Once | ORAL | Status: AC
  Administered 2020-08-31: 20 mg via ORAL

## 2020-08-31 NOTE — Telephone Encounter (Signed)
Spoke with patient about rescheduling labs and MD to 4/14 - infusion will remain on 4/18- patient is aware

## 2020-08-31 NOTE — Telephone Encounter (Signed)
Scheduled per 4/4 sch msg. Called and spoke with pt confirmed 4/6 appt

## 2020-08-31 NOTE — Progress Notes (Signed)
Per Dr. Burr Medico, ok to treat with [facility].

## 2020-08-31 NOTE — Patient Instructions (Signed)
[facility] Discharge Instructions for Patients Receiving Chemotherapy  Today you received the following chemotherapy agents Bortezimib(Velcade injection)   To help prevent nausea and vomiting after your treatment, we encourage you to take your nausea medication as directed.   If you develop nausea and vomiting that is not controlled by your nausea medication, call the clinic.   BELOW ARE SYMPTOMS THAT SHOULD BE REPORTED IMMEDIATELY:  *FEVER GREATER THAN 100.5 F  *CHILLS WITH OR WITHOUT FEVER  NAUSEA AND VOMITING THAT IS NOT CONTROLLED WITH YOUR NAUSEA MEDICATION  *UNUSUAL SHORTNESS OF BREATH  *UNUSUAL BRUISING OR BLEEDING  TENDERNESS IN MOUTH AND THROAT WITH OR WITHOUT PRESENCE OF ULCERS  *URINARY PROBLEMS  *BOWEL PROBLEMS  UNUSUAL RASH Items with * indicate a potential emergency and should be followed up as soon as possible.  Feel free to call the clinic should you have any questions or concerns. The clinic phone number is [phone].  Please show the [facility] at check-in to the Emergency Department and triage nurse.

## 2020-09-01 ENCOUNTER — Ambulatory Visit: Payer: BC Managed Care – PPO

## 2020-09-01 ENCOUNTER — Other Ambulatory Visit: Payer: Self-pay

## 2020-09-01 ENCOUNTER — Telehealth: Payer: Self-pay

## 2020-09-01 DIAGNOSIS — C9000 Multiple myeloma not having achieved remission: Secondary | ICD-10-CM

## 2020-09-01 MED ORDER — LENALIDOMIDE 25 MG PO CAPS: ORAL_CAPSULE | ORAL | 0 refills | Status: DC

## 2020-09-01 NOTE — Telephone Encounter (Signed)
Hannah [name] called stating she has been vomiting after she eats.  She is having abdominal cramping but no diarrhea. She is afebrile.  She states she feels like she has a stomach virus.  She is not taking revlimid at this time.  I encouraged her to take the zofran regularly and to stay on a clear liquid diet today and then advance diet tomorrow to bland diet if tolerated.  She verbalized understanding.

## 2020-09-02 ENCOUNTER — Inpatient Hospital Stay: Payer: BC Managed Care – PPO | Admitting: Nutrition

## 2020-09-02 ENCOUNTER — Other Ambulatory Visit: Payer: Self-pay

## 2020-09-02 DIAGNOSIS — C9000 Multiple myeloma not having achieved remission: Secondary | ICD-10-CM

## 2020-09-02 MED ORDER — LENALIDOMIDE 25 MG PO CAPS: ORAL_CAPSULE | ORAL | 0 refills | Status: DC

## 2020-09-03 ENCOUNTER — Ambulatory Visit (INDEPENDENT_AMBULATORY_CARE_PROVIDER_SITE_OTHER): Payer: BC Managed Care – PPO

## 2020-09-03 ENCOUNTER — Ambulatory Visit (INDEPENDENT_AMBULATORY_CARE_PROVIDER_SITE_OTHER): Payer: BC Managed Care – PPO | Admitting: Orthopedic Surgery

## 2020-09-03 ENCOUNTER — Other Ambulatory Visit: Payer: Self-pay | Admitting: Hematology

## 2020-09-03 DIAGNOSIS — M205X1 Other deformities of toe(s) (acquired), right foot: Secondary | ICD-10-CM

## 2020-09-03 DIAGNOSIS — C9000 Multiple myeloma not having achieved remission: Secondary | ICD-10-CM

## 2020-09-04 ENCOUNTER — Encounter: Payer: Self-pay | Admitting: Orthopedic Surgery

## 2020-09-04 ENCOUNTER — Ambulatory Visit: Payer: BC Managed Care – PPO

## 2020-09-04 NOTE — Progress Notes (Signed)
Office Visit Note   Patient: Hannah [name]           Date of Birth: [birth year]           MRN: [id number] Visit Date: 09/03/2020              Requested by: [name], MD [address] [place],  [place] [address] PCP: Osborne Casco Fransico Him, MD  Chief Complaint  Patient presents with  . Right Foot - Routine Post Op    07/07/20 right foot Weil osteotomy 2nd and 3rd and 4th MT proximal IP joint and fusion 2nd,4th toes       HPI: Patient is a 60 year old woman who is 2 months status post right foot Weil osteotomy second third and fourth metatarsals.  She is currently ambulating with a cane wearing crocs sneakers she feels like the toe may be bending.  Patient states she has been diagnosed with multiple myeloma and is going to start chemotherapy.  Assessment & Plan: Visit Diagnoses:  1. Claw toe, right     Plan: Recommended a stiff soled sneaker continue with scar massage continue with dorsiflexion stretching of the ankle.  Follow-Up Instructions: No follow-ups on file.   Ortho Exam  Patient is alert, oriented, no adenopathy, well-dressed, normal affect, normal respiratory effort. Examination her toes are straight she has good alignment there is no redness no cellulitis there is minimal swelling the incisions are well-healed.  She does have a little bit of tightness with dorsiflexion of the ankle.  Imaging: No results found. No images are attached to the encounter.  Labs: Lab Results  Component Value Date   ESRSEDRATE 53 (H) 09/05/2017   ESRSEDRATE 43 (H) 01/15/2016   CRP 0.6 01/15/2016   LABURIC 7.2 (H) 09/05/2017     Lab Results  Component Value Date   ALBUMIN 3.2 (L) 08/31/2020   ALBUMIN 3.0 (L) 08/24/2020   ALBUMIN 3.2 (L) 08/17/2020    No results found for: MG No results found for: VD25OH  No results found for: PREALBUMIN CBC EXTENDED Latest Ref Rng & Units 08/31/2020 08/24/2020 08/17/2020  WBC 4.0 - 10.5 K/uL 2.0(L) 2.3(L) 5.2  RBC 3.87 - 5.11 MIL/uL  3.87 3.83(L) 3.93  HGB 12.0 - 15.0 g/dL 9.3(L) 9.2(L) 9.5(L)  HCT 36.0 - 46.0 % 28.1(L) 27.7(L) 28.1(L)  PLT 150 - 400 K/uL 113(L) 105(L) 128(L)  NEUTROABS 1.7 - 7.7 K/uL 1.3(L) 1.8 4.3  LYMPHSABS 0.7 - 4.0 K/uL 0.3(L) 0.2(L) 0.4(L)     There is no height or weight on file to calculate BMI.  Orders:  Orders Placed This Encounter  Procedures  . XR Foot 2 Views Right   No orders of the defined types were placed in this encounter.    Procedures: No procedures performed  Clinical Data: No additional findings.  ROS:  All other systems negative, except as noted in the HPI. Review of Systems  Objective: Vital Signs: LMP 10/22/2016 (Exact Date)   Specialty Comments:  No specialty comments available.  PMFS History: Patient Active Problem List   Diagnosis Date Noted  . Multiple myeloma ([facility]) 07/13/2020  . Claw toe, right   . Trigger finger, left middle finger 04/09/2020  . Stenosing tenosynovitis of finger of left hand 02/27/2020  . 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 Major depression, recurrent ([facility]) 01/12/2018  . Chronic right shoulder pain 12/20/2017  . MDD (major depressive disorder), recurrent episode, moderate ([facility]) 11/04/2017  . MGUS (monoclonal gammopathy of unknown significance) 10/24/2017  .  Primary osteoarthritis of both hands 09/26/2017  . Primary osteoarthritis of right shoulder 09/26/2017  . Primary osteoarthritis of both hips 09/26/2017  . Status post total knee replacement, right 09/26/2017  . Primary osteoarthritis of both feet 09/26/2017  . History of asthma 09/26/2017  . Primary osteoarthritis of left knee 04/18/2017  . Menorrhagia 11/04/2016  . Fibroid tumor 11/04/2016  . Adenomyosis 11/04/2016  . Hypertension, essential 11/04/2016  . 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 Allergy   . Anemia   . Anxiety   . Arthritis   . Asthma   . Depression   . Hypertension     Family History  Problem  Relation Age of Onset  . Cancer Mother        uterine   . Asthma Mother   . Arthritis Daughter   . Depression Maternal Uncle   . Colon cancer Neg Hx     Past Surgical History:  Procedure Laterality Date  . CHOLECYSTECTOMY  1993  . COLONOSCOPY    . 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 TOTAL KNEE ARTHROPLASTY Right 01/28/2016   Procedure: RIGHT TOTAL KNEE ARTHROPLASTY;  Surgeon: [name], MD;  Location: [facility];  Service: Orthopedics;  Laterality: Right;  . TRIGGER FINGER RELEASE Right 06/18/2014   Procedure: RIGHT LONG FINGER TRIGGER RELEASE;  Surgeon: [name], MD;  Location: [facility];  Service: Orthopedics;  Laterality: Right;  . TUBAL LIGATION    . VAGINAL HYSTERECTOMY Bilateral 11/04/2016   Procedure: HYSTERECTOMY VAGINAL with Bilateral Salpingectomy;  Surgeon: [name], MD;  Location: [facility];  Service: Gynecology;  Laterality: Bilateral;  . WEIL OSTEOTOMY Right 07/07/2020   Procedure: RIGHT FOOT WEIL OSTEOTOMY 2, 3, AND 4 METATARSALS AND PROXIMAL INTERPHALANGEAL JOINT FUSION 2 & 4 TOES;  Surgeon: Newt Minion, MD;  Location: [facility];  Service: Orthopedics;  Laterality: Right;   Social History   Occupational History  . Not on file  Tobacco Use  . Smoking status: Never Smoker  . Smokeless tobacco: Never Used  Vaping Use  . Vaping Use: Never used  Substance and Sexual Activity  . Alcohol use: No    Alcohol/week: 0.0 standard drinks  . Drug use: No  . Sexual activity: Yes    Partners: Male    Birth control/protection: Surgical

## 2020-09-07 ENCOUNTER — Other Ambulatory Visit: Payer: Self-pay

## 2020-09-07 ENCOUNTER — Other Ambulatory Visit: Payer: Self-pay | Admitting: Hematology

## 2020-09-07 ENCOUNTER — Inpatient Hospital Stay: Payer: BC Managed Care – PPO

## 2020-09-07 ENCOUNTER — Inpatient Hospital Stay: Payer: BC Managed Care – PPO | Admitting: Nutrition

## 2020-09-07 VITALS — BP 107/69 | HR 99 | Temp 98.2°F | Resp 17 | Wt 200.0 lb

## 2020-09-07 DIAGNOSIS — C9000 Multiple myeloma not having achieved remission: Secondary | ICD-10-CM | POA: Diagnosis not present

## 2020-09-07 DIAGNOSIS — D472 Monoclonal gammopathy: Secondary | ICD-10-CM

## 2020-09-07 DIAGNOSIS — M1990 Unspecified osteoarthritis, unspecified site: Secondary | ICD-10-CM | POA: Diagnosis not present

## 2020-09-07 DIAGNOSIS — F32A Depression, unspecified: Secondary | ICD-10-CM | POA: Diagnosis not present

## 2020-09-07 DIAGNOSIS — I10 Essential (primary) hypertension: Secondary | ICD-10-CM | POA: Diagnosis not present

## 2020-09-07 DIAGNOSIS — D509 Iron deficiency anemia, unspecified: Secondary | ICD-10-CM | POA: Diagnosis not present

## 2020-09-07 DIAGNOSIS — F419 Anxiety disorder, unspecified: Secondary | ICD-10-CM | POA: Diagnosis not present

## 2020-09-07 DIAGNOSIS — E669 Obesity, unspecified: Secondary | ICD-10-CM | POA: Diagnosis not present

## 2020-09-07 DIAGNOSIS — Z5112 Encounter for antineoplastic immunotherapy: Secondary | ICD-10-CM | POA: Diagnosis not present

## 2020-09-07 LAB — CBC WITH DIFFERENTIAL (CANCER CENTER ONLY)
Abs Immature Granulocytes: 0.03 10*3/uL (ref 0.00–0.07)
Basophils Absolute: 0 10*3/uL (ref 0.0–0.1)
Basophils Relative: 0 %
Eosinophils Absolute: 0 10*3/uL (ref 0.0–0.5)
Eosinophils Relative: 1 %
HCT: 31.3 % — ABNORMAL LOW (ref 36.0–46.0)
Hemoglobin: 10.3 g/dL — ABNORMAL LOW (ref 12.0–15.0)
Immature Granulocytes: 1 %
Lymphocytes Relative: 26 %
Lymphs Abs: 0.8 10*3/uL (ref 0.7–4.0)
MCH: 24.2 pg — ABNORMAL LOW (ref 26.0–34.0)
MCHC: 32.9 g/dL (ref 30.0–36.0)
MCV: 73.5 fL — ABNORMAL LOW (ref 80.0–100.0)
Monocytes Absolute: 0.3 10*3/uL (ref 0.1–1.0)
Monocytes Relative: 10 %
Neutro Abs: 2 10*3/uL (ref 1.7–7.7)
Neutrophils Relative %: 62 %
Platelet Count: 188 10*3/uL (ref 150–400)
RBC: 4.26 MIL/uL (ref 3.87–5.11)
RDW: 19 % — ABNORMAL HIGH (ref 11.5–15.5)
WBC Count: 3.2 10*3/uL — ABNORMAL LOW (ref 4.0–10.5)
nRBC: 0.6 % — ABNORMAL HIGH (ref 0.0–0.2)

## 2020-09-07 LAB — CMP (CANCER CENTER ONLY)
ALT: 56 U/L — ABNORMAL HIGH (ref 0–44)
AST: 35 U/L (ref 15–41)
Albumin: 3.5 g/dL (ref 3.5–5.0)
Alkaline Phosphatase: 85 U/L (ref 38–126)
Anion gap: 15 (ref 5–15)
BUN: 25 mg/dL — ABNORMAL HIGH (ref 6–20)
CO2: 31 mmol/L (ref 22–32)
Calcium: 9.2 mg/dL (ref 8.9–10.3)
Chloride: 93 mmol/L — ABNORMAL LOW (ref 98–111)
Creatinine: 0.87 mg/dL (ref 0.44–1.00)
GFR, Estimated: >60 mL/min (ref >60)
Glucose, Bld: 123 mg/dL — ABNORMAL HIGH (ref 70–99)
Potassium: 2.8 mmol/L — ABNORMAL LOW (ref 3.5–5.1)
Sodium: 139 mmol/L (ref 135–145)
Total Bilirubin: 0.6 mg/dL (ref 0.3–1.2)
Total Protein: 7.9 g/dL (ref 6.5–8.1)

## 2020-09-07 MED ORDER — BORTEZOMIB CHEMO SQ INJECTION 3.5 MG (2.5MG/ML)
1.3000 mg/m2 | Freq: Once | INTRAMUSCULAR | Status: AC
  Administered 2020-09-07: 2.75 mg via SUBCUTANEOUS
  Filled 2020-09-07: qty 1.1

## 2020-09-07 MED ORDER — DEXAMETHASONE 4 MG PO TABS
ORAL_TABLET | ORAL | Status: AC
  Filled 2020-09-07: qty 5

## 2020-09-07 MED ORDER — DEXAMETHASONE 4 MG PO TABS
20.0000 mg | ORAL_TABLET | Freq: Once | ORAL | Status: AC
  Administered 2020-09-07: 20 mg via ORAL

## 2020-09-07 NOTE — Progress Notes (Signed)
60 year old female diagnosed with multiple myeloma and followed by Dr. Burr Medico. Patient receiving Zometa, Velcade, Revlimid, and dexamethasone.  Past medical history includes anxiety, depression, hypertension.  Medications include vitamin D3, ferrous sulfate, Zofran, and Protonix.  Labs include potassium 2.8, glucose 123, BUN 25.  Height: 66 inches. Weight: 200 pounds on April 11. Usual body weight: Patient weighed 228 pounds on July 28, 2020.  She weighed 242 pounds October 2021. BMI: 32.28.  Patient reports she has decreased taste and early satiety.  She has had occasional nausea and vomiting and reports she vomits at least once a week.  Nutrition diagnosis: Unintended weight loss related to multiple myeloma as evidenced by 17% weight loss over 6 months which is significant.  Intervention: Educated patient on the importance of smaller more frequent meals and snacks including higher calorie, higher protein foods. Recommended 3 meals +3 snacks daily. Educated on strategies for taste alterations. Briefly reviewed strategies for nausea and vomiting and constipation. Provided nutrition fact sheets.  Contact information given.  Questions were answered.  Teach back method used.  Monitoring, evaluation, goals: Patient will tolerate increased calories and protein for weight maintenance.  Next visit: Monday, May 2 during infusion.  **Disclaimer: This note was dictated with voice recognition software. Similar sounding words can inadvertently be transcribed and this note may contain transcription errors which may not have been corrected upon publication of note.**

## 2020-09-07 NOTE — Patient Instructions (Signed)
[facility] Discharge Instructions for Patients Receiving Chemotherapy  Today you received the following chemotherapy agents Bortezimib(Velcade injection)   To help prevent nausea and vomiting after your treatment, we encourage you to take your nausea medication as directed.   If you develop nausea and vomiting that is not controlled by your nausea medication, call the clinic.   BELOW ARE SYMPTOMS THAT SHOULD BE REPORTED IMMEDIATELY:  *FEVER GREATER THAN 100.5 F  *CHILLS WITH OR WITHOUT FEVER  NAUSEA AND VOMITING THAT IS NOT CONTROLLED WITH YOUR NAUSEA MEDICATION  *UNUSUAL SHORTNESS OF BREATH  *UNUSUAL BRUISING OR BLEEDING  TENDERNESS IN MOUTH AND THROAT WITH OR WITHOUT PRESENCE OF ULCERS  *URINARY PROBLEMS  *BOWEL PROBLEMS  UNUSUAL RASH Items with * indicate a potential emergency and should be followed up as soon as possible.  Feel free to call the clinic should you have any questions or concerns. The clinic phone number is [phone].  Please show the [facility] at check-in to the Emergency Department and triage nurse.

## 2020-09-08 ENCOUNTER — Other Ambulatory Visit: Payer: Self-pay

## 2020-09-08 ENCOUNTER — Encounter: Payer: Self-pay | Admitting: Physical Therapy

## 2020-09-08 ENCOUNTER — Ambulatory Visit: Payer: BC Managed Care – PPO | Admitting: Physical Therapy

## 2020-09-08 ENCOUNTER — Telehealth: Payer: Self-pay

## 2020-09-08 ENCOUNTER — Other Ambulatory Visit: Payer: Self-pay | Admitting: Nurse Practitioner

## 2020-09-08 DIAGNOSIS — M6281 Muscle weakness (generalized): Secondary | ICD-10-CM

## 2020-09-08 DIAGNOSIS — M5442 Lumbago with sciatica, left side: Secondary | ICD-10-CM | POA: Diagnosis not present

## 2020-09-08 DIAGNOSIS — R262 Difficulty in walking, not elsewhere classified: Secondary | ICD-10-CM | POA: Diagnosis not present

## 2020-09-08 DIAGNOSIS — C9000 Multiple myeloma not having achieved remission: Secondary | ICD-10-CM | POA: Diagnosis not present

## 2020-09-08 DIAGNOSIS — G8929 Other chronic pain: Secondary | ICD-10-CM

## 2020-09-08 DIAGNOSIS — R5383 Other fatigue: Secondary | ICD-10-CM

## 2020-09-08 DIAGNOSIS — M25652 Stiffness of left hip, not elsewhere classified: Secondary | ICD-10-CM | POA: Diagnosis not present

## 2020-09-08 DIAGNOSIS — R112 Nausea with vomiting, unspecified: Secondary | ICD-10-CM

## 2020-09-08 DIAGNOSIS — R252 Cramp and spasm: Secondary | ICD-10-CM | POA: Diagnosis not present

## 2020-09-08 MED ORDER — PROCHLORPERAZINE MALEATE 10 MG PO TABS: 10.0000 mg | ORAL_TABLET | Freq: Four times a day (QID) | ORAL | 3 refills | Status: DC | PRN

## 2020-09-08 NOTE — Telephone Encounter (Signed)
Hannah [name] called she has been vomiting since this am.  She only tolerates small amt of clear liquids.  I recommended she take zofran every 78 hours and take compazine in between.  I asked her to call tomorrow afternoon if she is not better.  She verbalized understanding.

## 2020-09-08 NOTE — Therapy (Signed)
[place], [place], [address] Phone: [phone]   Fax:  [phone]  Physical Therapy Treatment  Patient Details  Name: Hannah [name] MRN: [id number] Date of Birth: 16-Aug-1960 Referring Provider (PT): Cira Rue   Encounter Date: 09/08/2020   PT End of Session - 09/08/20 0850    Visit Number 7    Number of Visits 12    Date for PT Re-Evaluation 09/16/20    Authorization Type [employer]    PT Start Time 0815   pt arrived at 62   PT Stop Time 0850    PT Time Calculation (min) 35 min    Activity Tolerance Patient tolerated treatment well    Behavior During Therapy [facility] for tasks assessed/performed           Past Medical History:  Diagnosis Date  . Allergy   . Anemia   . Anxiety   . Arthritis   . Asthma   . Depression   . Hypertension     Past Surgical History:  Procedure Laterality Date  . CHOLECYSTECTOMY  1993  . COLONOSCOPY    . 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 TOTAL KNEE ARTHROPLASTY Right 01/28/2016   Procedure: RIGHT TOTAL KNEE ARTHROPLASTY;  Surgeon: [name], MD;  Location: [facility];  Service: Orthopedics;  Laterality: Right;  . TRIGGER FINGER RELEASE Right 06/18/2014   Procedure: RIGHT LONG FINGER TRIGGER RELEASE;  Surgeon: [name], MD;  Location: [facility];  Service: Orthopedics;  Laterality: Right;  . TUBAL LIGATION    . VAGINAL HYSTERECTOMY Bilateral 11/04/2016   Procedure: HYSTERECTOMY VAGINAL with Bilateral Salpingectomy;  Surgeon: [name], MD;  Location: [facility];  Service: Gynecology;  Laterality: Bilateral;  . WEIL OSTEOTOMY Right 07/07/2020   Procedure: RIGHT FOOT WEIL OSTEOTOMY 2, 3, AND 4 METATARSALS AND PROXIMAL INTERPHALANGEAL JOINT FUSION 2 & 4 TOES;  Surgeon: Newt Minion, MD;  Location: [facility];  Service: Orthopedics;  Laterality: Right;    There were no vitals filed for this visit.   Subjective  Assessment - 09/08/20 0817    Subjective I am ok. I am weak because I haven't been able to eat. I had a stomach virus a week ago.    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 She as diagnosed in February 2022 with Multiple Myeloma not in remission. She is presently having palliative radiation for her back and pelvic pain, and has had 1 treatment of Zometa.  She will start chemotherapy on 08/17/20, She fatigues very easily.  She is presently out of work and just got out of a post op shoe for her right foot.  She is using a SPC corrected now to use with left UE. 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    Currently in Pain? Yes    Pain Score 4     Pain Location Pelvis    Pain Orientation Right;Left    Pain Descriptors / Indicators Aching    Pain Type Chronic pain    Pain Onset More than a month ago    Pain Frequency Constant    Aggravating Factors  movement    Pain Relieving Factors heat    Effect of Pain on Daily Activities limits ability    Pain Score 4    Pain Location Back  Pain Orientation Right;Mid    Pain Descriptors / Indicators Aching    Pain Type Chronic pain    Pain Onset More than a month ago    Pain Frequency Constant    Aggravating Factors  coughing, movement, sneezing    Pain Relieving Factors heat, pain meds    Effect of Pain on Daily Activities hard to move                             [facility] Adult PT Treatment/Exercise - 09/08/20 0001      Lumbar Exercises: Stretches   Pelvic Tilt 10 reps;5 seconds    Other Lumbar Stretch Exercise Meeks decompression exercises - all x 5 with 5 sec holds      Lumbar Exercises: Standing   Heel Raises 15 reps   v/c to keep weight through all toes, hat counter     Lumbar Exercises: Seated   Sit to Stand 10 reps   high table with rest between sets of 5   Sit to Stand Limitations  without UE- pt with increased difficuty with this today      Lumbar Exercises: Supine   Clam 20 reps   yellow band held by PT; done in sets of 5   Bent Knee Raise 10 reps   while maintaining pelvic tilt done in 2 sets of 5 with alternate arm raise-v/c not to raise foot too high to avoid losing pelvic tilt   Other Supine Lumbar Exercises pelvic tilt with ball squeeze with 3 sec hold 2 x 5      Knee/Hip Exercises: Standing   Knee Flexion Strengthening;Both;1 set;10 reps   holding on to counter for support   Hip Abduction Stengthening;Both;1 set;10 reps;Knee straight   increased fatigue with this   Hip Extension Stengthening;1 set;10 reps;Both    Other Standing Knee Exercises marching x 10 reps bilaterally with knee bent and 1 hand on counter for support    Other Standing Knee Exercises toe taps to 4in step x 10 reps each; mini squats at counter x 10                    PT Short Term Goals - 05/19/20 1432      PT SHORT TERM GOAL #1   Title STG=LTG             PT Long Term Goals - 08/05/20 1208      PT LONG TERM GOAL #1   Title Pt will be independent with all HEP given    Baseline No knowledge    Time 6    Period Weeks    Status New    Target Date 09/16/20      PT LONG TERM GOAL #2   Title Pt will be able to perform household chores without exacerbating back pain and using pain managment techniques    Baseline Pt unable to sweep or wash dishes for longer than 10 minutes    Period Weeks    Status New    Target Date 09/16/20      PT LONG TERM GOAL #3   Title Pt will be able to perform a 5 x sts in 20 sec or less showing improved LE strength and control without pain    Baseline 55 sec baseline    Time 6    Period Weeks    Status New    Target Date 09/16/20  PT LONG TERM GOAL #4   Title Pt will improve TUG time to 13 seconds or less to decrease risk of falls    Baseline 55 sec at eval    Time 6    Period Weeks    Status New    Target Date 09/16/20       PT LONG TERM GOAL #5   Title Pt will be able to hold each position of 4 position balance test for atleast 10 seconds without LOB    Baseline unable to hold tandem or SLS (right SLS not tested today secondary to recent surgery)    Time 6    Period Weeks    Status New    Target Date 09/16/20                 Plan - 09/08/20 0850    Clinical Impression Statement Pt has missed the last 3 sessions due to having a stomach virus and feels fatigued today. She is still having pelvic pain and mid back pain. Continued with Meeks decompression and core strengthening to address her back pain and core weakness. Added standing strengthening exercises at end of session to continue to build strength and endurance. Pt fatigues quickly but overall was able to complete more standing exercises today with no seated recovery periods.    PT Frequency 2x / week    PT Duration 6 weeks    PT Treatment/Interventions ADLs/Self Care Home Management;Aquatic Therapy;Iontophoresis 4mg /ml Dexamethasone;Gait training;Stair training;Functional mobility training;Therapeutic activities;Therapeutic exercise;Balance training;Neuromuscular re-education;Manual techniques;Patient/family education;Passive range of motion;Joint Manipulations    PT Next Visit Plan Continue standing exs, cont. generalized strength, balance. mindful of radiation for back/pelvic/rib pain.  rest breaks prn    PT Home Exercise Plan meeks decompression, ppt, ppt with low march, standing heel raises, mini squats, hip abd, hip ext all at counter x 10 reps    Consulted and Agree with Plan of Care Patient           Patient will benefit from skilled therapeutic intervention in order to improve the following deficits and impairments:  Abnormal gait,Decreased mobility,Decreased activity tolerance,Decreased endurance,Decreased strength,Decreased balance,Decreased knowledge of precautions,Difficulty walking,Postural dysfunction,Pain  Visit Diagnosis: Muscle  weakness (generalized)  Chronic low back pain with left-sided sciatica, unspecified back pain laterality  Fatigue, unspecified type  Difficulty in walking, not elsewhere classified     Problem List Patient Active Problem List   Diagnosis Date Noted  . Multiple myeloma ([facility]) 07/13/2020  . Claw toe, right   . Trigger finger, left middle finger 04/09/2020  . Stenosing tenosynovitis of finger of left hand 02/27/2020  . 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 Major depression, recurrent ([facility]) 01/12/2018  . Chronic right shoulder pain 12/20/2017  . MDD (major depressive disorder), recurrent episode, moderate ([facility]) 11/04/2017  . MGUS (monoclonal gammopathy of unknown significance) 10/24/2017  . Primary osteoarthritis of both hands 09/26/2017  . Primary osteoarthritis of right shoulder 09/26/2017  . Primary osteoarthritis of both hips 09/26/2017  . Status post total knee replacement, right 09/26/2017  . Primary osteoarthritis of both feet 09/26/2017  . History of asthma 09/26/2017  . Primary osteoarthritis of left knee 04/18/2017  . Menorrhagia 11/04/2016  . Fibroid tumor 11/04/2016  . Adenomyosis 11/04/2016  . Hypertension, essential 11/04/2016  . Total knee replacement status 01/28/2016    Allyson Sabal [facility] 09/08/2020, 8:56 AM  [place], [place], [address] Phone: [phone]   Fax:  [phone]  Name: Shavaughn  SEMAYA VIDA MRN: [id number] Date of Birth: 04-May-1961  Allyson Sabal [place], PT 09/08/20 8:59 AM

## 2020-09-09 NOTE — Progress Notes (Signed)
[place]   [phone] [phone]   Clinic Follow up Note   Patient Care Team: Tisovec, Fransico Him, MD as PCP - General (Internal Medicine) Bo Merino, MD as Consulting Physician (Rheumatology)  Date of Service:  09/10/2020  CHIEF COMPLAINT: f/u of multiple myeloma  SUMMARY OF ONCOLOGIC HISTORY: Oncology History Overview Note  Cancer Staging Multiple myeloma ([place]) Staging form: Plasma Cell Myeloma and Plasma Cell Disorders, AJCC 8th Edition - Clinical stage from 07/20/2020: Beta-2-microglobulin (mg/L): 2.4, Albumin (g/dL): 3.2, ISS: Stage II, High-risk cytogenetics: Unknown, LDH: Normal - Signed by Truitt Merle, MD on 08/02/2020 Beta 2 microglobulin range (mg/L): Less than 3.5 Albumin range (g/dL): Less than 3.5 Cytogenetics: Unknown    Multiple myeloma ([place])  07/03/2020 Imaging   MRI Lumbar Spine  IMPRESSION: 1. Numerous T1 hypointense and STIR hyperintense lesions throughout the visualized spine and sacrum with multiple areas of extraosseous extension in the sacrum, detailed above and compatible with multiple myeloma given the clinical history. 2. An MRI of the pelvis with contrast could evaluate the full extent of the partially imaged sacral lesions, including left S1-S2 neural foraminal involvement and suspected involvement of the exited left L5 nerve. 3. Multilevel degenerative change without significant canal or foraminal stenosis in the lumbar spine.   07/13/2020 Initial Diagnosis   Multiple myeloma ([place])   07/20/2020 Cancer Staging   Staging form: Plasma Cell Myeloma and Plasma Cell Disorders, AJCC 8th Edition - Clinical stage from 07/20/2020: Beta-2-microglobulin (mg/L): 2.4, Albumin (g/dL): 3.2, ISS: Stage II, High-risk cytogenetics: Unknown, LDH: Normal - Signed by Truitt Merle, MD on 08/31/2020 Beta 2 microglobulin range (mg/L): Less than 3.5 Albumin range (g/dL): Less than 3.5 Cytogenetics: No abnormalities Bone disease on  imaging: Present   07/24/2020 PET scan   IMPRESSION: 1. Moderate to high hypermetabolic activity associated with the expansile soft tissue masses within the LEFT and RIGHT pelvic bones is consistent with active multiple myeloma / plasmacytoma. 2. Lytic lesion in the LEFT scapula with mild metabolic activity is indeterminate. 3. Metabolic activity associated with the RIGHT knee prosthetic and RIGHT distal foot fusion is favored post procedural inflammation.     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  The findings are most consistent with  persistent/recurrent/previously known plasma cell neoplasm.  For  completeness, immunohistochemical stain for CD138 and in situ  hybridization for kappa and lambda will be performed and the results  reported in an addendum.  Correlation with cytogenetic and FISH studies  is recommended.   08/03/2020 -  Chemotherapy   Zometa starting 08/03/20    08/17/2020 -  Chemotherapy   PENDING Velcade weekly, Revlimid, dex 62m weekly (VRd)  Starting 08/17/20.        CURRENT THERAPY:  Zometa starting 08/03/20 Velcade, Revlimid, dex(VRd) 2 weeks on/1 week offstarting 08/17/20.  INTERVAL HISTORY:  Hannah VINEis here for a follow up of multiple myeloma. She was last seen by me on 08/31/20. She presents to the clinic alone. She  reports fatigue. She notes she does not do much around the house. (She does not cook, her husband  does.) She continues physical therapy when she feels well enough. She notes she was unable to finish today due to fatigue. She reports she had a stomach virus ("like a 24 hour bug") last week, which caused her to vomit and not eat enough. This was during her chemo break. She endorses using Ensure to boost her nutrition because of lack of taste. She notes she met with a nutritionist recently, who recommended a mouthwash to help stimulate her taste buds. She notes she has not taken her Norco recently.  REVIEW OF SYSTEMS:   Constitutional: Denies fevers, chills or abnormal weight loss, (+) fatigue Eyes: Denies blurriness of vision Ears, nose, mouth, throat, and face: Denies mucositis or sore throat Respiratory: Denies cough, dyspnea or wheezes Cardiovascular: Denies palpitation, chest discomfort or lower extremity swelling Gastrointestinal:  Denies nausea, heartburn or change in bowel habits Skin: Denies abnormal skin rashes Lymphatics: Denies new lymphadenopathy or easy bruising Neurological:Denies numbness, tingling or new weaknesses Behavioral/Psych: Mood is stable, no new changes  All other systems were reviewed with the patient and are negative.  MEDICAL HISTORY:  Past Medical History:  Diagnosis Date  . Allergy   . Anemia   . Anxiety   . Arthritis   . Asthma   . Depression   . Hypertension     SURGICAL HISTORY: Past Surgical History:  Procedure Laterality Date  . CHOLECYSTECTOMY  1993  . COLONOSCOPY    . 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 TOTAL KNEE ARTHROPLASTY Right 01/28/2016   Procedure: RIGHT TOTAL KNEE ARTHROPLASTY;  Surgeon: [name], MD;  Location: [facility];  Service: Orthopedics;  Laterality: Right;  . TRIGGER FINGER RELEASE Right 06/18/2014   Procedure: RIGHT LONG FINGER TRIGGER RELEASE;  Surgeon: [name], MD;  Location: [facility];  Service: Orthopedics;  Laterality: Right;  . TUBAL LIGATION    . VAGINAL HYSTERECTOMY Bilateral  11/04/2016   Procedure: HYSTERECTOMY VAGINAL with Bilateral Salpingectomy;  Surgeon: [name], MD;  Location: [facility];  Service: Gynecology;  Laterality: Bilateral;  . WEIL OSTEOTOMY Right 07/07/2020   Procedure: RIGHT FOOT WEIL OSTEOTOMY 2, 3, AND 4 METATARSALS AND PROXIMAL INTERPHALANGEAL JOINT FUSION 2 & 4 TOES;  Surgeon: Newt Minion, MD;  Location: [facility];  Service: Orthopedics;  Laterality: Right;    I have reviewed the social history and family history with the patient and they are unchanged from previous note.  ALLERGIES:  is allergic to elemental sulfur.  MEDICATIONS:  Current Outpatient Medications  Medication Sig Dispense Refill  . albuterol (VENTOLIN HFA) 108 (90 Base) MCG/ACT inhaler Inhale into the lungs.    . budesonide-formoterol (SYMBICORT) 160-4.5 MCG/ACT inhaler Inhale 2 puffs into the lungs daily as needed.    . calcium carbonate (OSCAL) 1500 (600 Ca) MG TABS tablet Take 600 mg of elemental calcium by mouth daily.    . chlorthalidone (HYGROTON) 25 MG tablet TAKE 1 TABLET BY MOUTH EVERY DAY FOR HTN  3  . Cholecalciferol (VITAMIN D3) 1.25 MG (50000 UT) CAPS Take 1 capsule by mouth once a week.    . ferrous sulfate 325 (65 FE) MG tablet Take 325 mg by mouth 2 (two) times daily with a meal.    . KLOR-CON M20 20 MEQ tablet TAKE 1 TABLET BY MOUTH TWICE A DAY 60 tablet 0  . lenalidomide (REVLIMID) 25 MG capsule Take 1  capsule by mouth for 14 days and then 0 capsules for 7 days 14 capsule 0  . ondansetron (ZOFRAN) 8 MG tablet Take 1 tablet (8 mg total) by mouth every 8 (eight) hours as needed for nausea or vomiting. 20 tablet 3  . pantoprazole (PROTONIX) 20 MG tablet TAKE 1 TABLET BY MOUTH EVERY DAY 30 tablet 0  . prochlorperazine (COMPAZINE) 10 MG tablet Take 1 tablet (10 mg total) by mouth every 6 (six) hours as needed for nausea or vomiting. 30 tablet 3  . valACYclovir (VALTREX) 500 MG tablet Take 500 mg by mouth 2 (two) times daily.    . Vitamin D,  Ergocalciferol, (DRISDOL) 50000 units CAPS capsule Take 50,000 Units by mouth every Monday.      No current facility-administered medications for this visit.    PHYSICAL EXAMINATION: ECOG PERFORMANCE STATUS: 2 - Symptomatic, <50% confined to bed  Vitals:   09/10/20 0949  BP: (!) 141/72  Pulse: (!) 118  Resp: 20  Temp: (!) 97.1 F (36.2 C)  SpO2: 99%   Filed Weights   09/10/20 0949  Weight: 195 lb 11.2 oz (88.8 kg)    Due to COVID19 we will limit examination to appearance. Patient had no complaints.  GENERAL:alert, no distress and comfortable SKIN: skin color normal, no rashes or significant lesions EYES: normal, Conjunctiva are pink and non-injected, sclera clear  NEURO: alert & oriented x 3 with fluent speech  LABORATORY DATA:  I have reviewed the data as listed CBC Latest Ref Rng & Units 09/10/2020 09/07/2020 08/31/2020  WBC 4.0 - 10.5 K/uL 3.9(L) 3.2(L) 2.0(L)  Hemoglobin 12.0 - 15.0 g/dL 11.0(L) 10.3(L) 9.3(L)  Hematocrit 36.0 - 46.0 % 32.9(L) 31.3(L) 28.1(L)  Platelets 150 - 400 K/uL 177 188 113(L)     CMP Latest Ref Rng & Units 09/10/2020 09/07/2020 08/31/2020  Glucose 70 - 99 mg/dL 115(H) 123(H) 102(H)  BUN 6 - 20 mg/dL 23(H) 25(H) 13  Creatinine 0.44 - 1.00 mg/dL 0.91 0.87 0.82  Sodium 135 - 145 mmol/L 139 139 141  Potassium 3.5 - 5.1 mmol/L 2.6(LL) 2.8(L) 3.0(L)  Chloride 98 - 111 mmol/L 91(L) 93(L) 94(L)  CO2 22 - 32 mmol/L 33(H) 31 32  Calcium 8.9 - 10.3 mg/dL 9.0 9.2 8.8(L)  Total Protein 6.5 - 8.1 g/dL 8.0 7.9 8.4(H)  Total Bilirubin 0.3 - 1.2 mg/dL 0.8 0.6 0.5  Alkaline Phos 38 - 126 U/L 103 85 60  AST 15 - 41 U/L 73(H) 35 19  ALT 0 - 44 U/L 137(H) 56(H) 29      RADIOGRAPHIC STUDIES: I have personally reviewed the radiological images as listed and agreed with the findings in the report. No results found.   ASSESSMENT & PLAN:  Hannah [name] is a 61 y.o. 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 I previouslydiscussed with pathologist Dr. Gari Crown, the increased plasma cells in bone marrow is likely between 10 to 20%, and would favorsmolderingmultiple myeloma than MGUS. She initially did not meet CRAB Criteria for MM.  -After increase of IgA, light chain and M-protein (3.3) on 06/01/20 labs,multiple T1 lesions in her lumbarspine and sacrumon 07/03/20 MRI, hypermetabolic uptake inmultiplebone lesion on 07/24/20 PET,she meets thediagnostic criteria for multiple myeloma -She underwent a bone marrow biopsy last week, which showed 31%plasmacells.  Cytology and FISH was negative  gene mutations, which indicating standard risk disease.I discussed with her today.  -She has completed palliative radiation to sacral bone lesions -She has started induction which chemotherapy withVRd (Velcade, Revlimid and dexamethasone), she is acandidate for bone marrow transplant.  We refered her to [facility] program, but she has not yet been contacted. Will f/u on that  -She is taking Revlimid 2 weeks on, and 1 week off -I reviewed that she should continue to follow up with her dentist. She denies any dental issues. -I recommended she continue baby aspirin to prevent blood clots. -Lab reviewed, adequate for treatment, will proceed Velcade injection next Monday    2. Low back pain, right hip pain,Mid right back pain,secondary to MM -During work up with orthopedic surgeon, she was found to have multiple T1 lesions of her spine and sacrum compatible with MM on her 07/03/20 MRI lumbar spine.These were hypermetabolic on 7/51/02 PET. -For pain management she ison Norco as needed -She has completed her course of palliative radiation, pain is much improved. -She continues with physical therapy, as tolerated -She started  Zometa on 08/03/20 to help strengthen her bones.  We will continue monthly.  She will continue calcium and vitamin D supplement   3. 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  -Hemoglobin electrophoresisshowed increase in Hg C, consistent with Hg C trait. I discussed the results with her and informed her that her children are at risk ofhemoglobin C trait, and I encouraged him to be tested. -Anemia overall mild and stable.Continue oral ironBID.  4. HTN, OA, Obesity,Anxiety, depression -She will continue to f/u with PCP, Chiropractor and Ortho. -For Anxiety and depression she isunder care of [facility] provider. MoodstableonZoloft  5. Nausea, diarrhea and hypokalemia  -Possible related to recent GI flu or chemo  -She is feeling better, take antiemetics as needed, diarrhea resolved -Potassium still low today she is on oral potassium twice daily, will take extra 2 tablets today, and increase to 3 times a day -Repeat lab next Monday, will give IV potassium if needed.   PLAN: -We will reach out to [facility] regarding her referral  -Labs reviewed, adequate for treatment, will proceed weekly dexamethasone 20 mg and Velcade injection on Monday, 09/14/20 -Continue Revlimid 62m 2 weeks on, 1 week off.  -We will repeat lab on April 18, if potassium below 3, will give IV potassium -f/u in 2-3 weeks    No problem-specific Assessment & Plan notes found for this encounter.   No orders of the defined types were placed in this encounter.  All questions were answered. The patient knows to call the clinic with any problems, questions or concerns. No barriers to learning was detected. The total time spent in the appointment was 30 minutes.     YTruitt Merle MD 09/10/2020   I, KWilburn Mylar am acting as scribe for YTruitt Merle MD.   I have reviewed the above documentation for accuracy and completeness, and I agree  with the above.

## 2020-09-10 ENCOUNTER — Encounter: Payer: Self-pay | Admitting: Hematology

## 2020-09-10 ENCOUNTER — Ambulatory Visit: Payer: BC Managed Care – PPO

## 2020-09-10 ENCOUNTER — Telehealth: Payer: Self-pay

## 2020-09-10 ENCOUNTER — Inpatient Hospital Stay: Payer: BC Managed Care – PPO

## 2020-09-10 ENCOUNTER — Other Ambulatory Visit: Payer: Self-pay

## 2020-09-10 ENCOUNTER — Inpatient Hospital Stay (HOSPITAL_BASED_OUTPATIENT_CLINIC_OR_DEPARTMENT_OTHER): Payer: BC Managed Care – PPO | Admitting: Hematology

## 2020-09-10 VITALS — BP 141/72 | HR 118 | Temp 97.1°F | Resp 20 | Ht 66.0 in | Wt 195.7 lb

## 2020-09-10 DIAGNOSIS — M1990 Unspecified osteoarthritis, unspecified site: Secondary | ICD-10-CM | POA: Diagnosis not present

## 2020-09-10 DIAGNOSIS — R5383 Other fatigue: Secondary | ICD-10-CM | POA: Diagnosis not present

## 2020-09-10 DIAGNOSIS — C9000 Multiple myeloma not having achieved remission: Secondary | ICD-10-CM | POA: Diagnosis not present

## 2020-09-10 DIAGNOSIS — D509 Iron deficiency anemia, unspecified: Secondary | ICD-10-CM | POA: Diagnosis not present

## 2020-09-10 DIAGNOSIS — R262 Difficulty in walking, not elsewhere classified: Secondary | ICD-10-CM

## 2020-09-10 DIAGNOSIS — M25652 Stiffness of left hip, not elsewhere classified: Secondary | ICD-10-CM | POA: Diagnosis not present

## 2020-09-10 DIAGNOSIS — M6281 Muscle weakness (generalized): Secondary | ICD-10-CM | POA: Diagnosis not present

## 2020-09-10 DIAGNOSIS — F32A Depression, unspecified: Secondary | ICD-10-CM | POA: Diagnosis not present

## 2020-09-10 DIAGNOSIS — M5442 Lumbago with sciatica, left side: Secondary | ICD-10-CM | POA: Diagnosis not present

## 2020-09-10 DIAGNOSIS — G8929 Other chronic pain: Secondary | ICD-10-CM

## 2020-09-10 DIAGNOSIS — Z5112 Encounter for antineoplastic immunotherapy: Secondary | ICD-10-CM | POA: Diagnosis not present

## 2020-09-10 DIAGNOSIS — D472 Monoclonal gammopathy: Secondary | ICD-10-CM

## 2020-09-10 DIAGNOSIS — I10 Essential (primary) hypertension: Secondary | ICD-10-CM | POA: Diagnosis not present

## 2020-09-10 DIAGNOSIS — E669 Obesity, unspecified: Secondary | ICD-10-CM | POA: Diagnosis not present

## 2020-09-10 DIAGNOSIS — F419 Anxiety disorder, unspecified: Secondary | ICD-10-CM | POA: Diagnosis not present

## 2020-09-10 DIAGNOSIS — R252 Cramp and spasm: Secondary | ICD-10-CM | POA: Diagnosis not present

## 2020-09-10 LAB — CBC WITH DIFFERENTIAL (CANCER CENTER ONLY)
Abs Immature Granulocytes: 0.28 10*3/uL — ABNORMAL HIGH (ref 0.00–0.07)
Basophils Absolute: 0 10*3/uL (ref 0.0–0.1)
Basophils Relative: 1 %
Eosinophils Absolute: 0 10*3/uL (ref 0.0–0.5)
Eosinophils Relative: 1 %
HCT: 32.9 % — ABNORMAL LOW (ref 36.0–46.0)
Hemoglobin: 11 g/dL — ABNORMAL LOW (ref 12.0–15.0)
Immature Granulocytes: 7 %
Lymphocytes Relative: 13 %
Lymphs Abs: 0.5 10*3/uL — ABNORMAL LOW (ref 0.7–4.0)
MCH: 24.4 pg — ABNORMAL LOW (ref 26.0–34.0)
MCHC: 33.4 g/dL (ref 30.0–36.0)
MCV: 72.9 fL — ABNORMAL LOW (ref 80.0–100.0)
Monocytes Absolute: 0.2 10*3/uL (ref 0.1–1.0)
Monocytes Relative: 5 %
Neutro Abs: 2.9 10*3/uL (ref 1.7–7.7)
Neutrophils Relative %: 73 %
Platelet Count: 177 10*3/uL (ref 150–400)
RBC: 4.51 MIL/uL (ref 3.87–5.11)
RDW: 19.3 % — ABNORMAL HIGH (ref 11.5–15.5)
WBC Count: 3.9 10*3/uL — ABNORMAL LOW (ref 4.0–10.5)
nRBC: 1.8 % — ABNORMAL HIGH (ref 0.0–0.2)

## 2020-09-10 LAB — CMP (CANCER CENTER ONLY)
ALT: 137 U/L — ABNORMAL HIGH (ref 0–44)
AST: 73 U/L — ABNORMAL HIGH (ref 15–41)
Albumin: 3.7 g/dL (ref 3.5–5.0)
Alkaline Phosphatase: 103 U/L (ref 38–126)
Anion gap: 15 (ref 5–15)
BUN: 23 mg/dL — ABNORMAL HIGH (ref 6–20)
CO2: 33 mmol/L — ABNORMAL HIGH (ref 22–32)
Calcium: 9 mg/dL (ref 8.9–10.3)
Chloride: 91 mmol/L — ABNORMAL LOW (ref 98–111)
Creatinine: 0.91 mg/dL (ref 0.44–1.00)
GFR, Estimated: >60 mL/min (ref >60)
Glucose, Bld: 115 mg/dL — ABNORMAL HIGH (ref 70–99)
Potassium: 2.6 mmol/L — CL (ref 3.5–5.1)
Sodium: 139 mmol/L (ref 135–145)
Total Bilirubin: 0.8 mg/dL (ref 0.3–1.2)
Total Protein: 8 g/dL (ref 6.5–8.1)

## 2020-09-10 NOTE — Therapy (Signed)
[place], [place], [address] Phone: [phone]   Fax:  [phone]  Physical Therapy Treatment  Patient Details  Name: Hannah [name] MRN: [id number] Date of Birth: [birth year] Referring Provider (PT): Cira Rue   Encounter Date: 09/10/2020   PT End of Session - 09/10/20 0815    Visit Number 8    Number of Visits 12    Date for PT Re-Evaluation 09/16/20    Authorization Type BC[employer]    PT Start Time 0804    PT Stop Time 0845    PT Time Calculation (min) 41 min    Activity Tolerance Treatment limited secondary to medical complications (Comment);Other (comment)   pt was unable to keep food down and felt weak.  Unable to continue treatment   Behavior During Therapy [facility] for tasks assessed/performed           Past Medical History:  Diagnosis Date  . Allergy   . Anemia   . Anxiety   . Arthritis   . Asthma   . Depression   . Hypertension     Past Surgical History:  Procedure Laterality Date  . CHOLECYSTECTOMY  1993  . COLONOSCOPY    . 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 TOTAL KNEE ARTHROPLASTY Right 01/28/2016   Procedure: RIGHT TOTAL KNEE ARTHROPLASTY;  Surgeon: [name], MD;  Location: [facility];  Service: Orthopedics;  Laterality: Right;  . TRIGGER FINGER RELEASE Right 06/18/2014   Procedure: RIGHT LONG FINGER TRIGGER RELEASE;  Surgeon: [name], MD;  Location: [facility];  Service: Orthopedics;  Laterality: Right;  . TUBAL LIGATION    . VAGINAL HYSTERECTOMY Bilateral 11/04/2016   Procedure: HYSTERECTOMY VAGINAL with Bilateral Salpingectomy;  Surgeon: [name], MD;  Location: [facility];  Service: Gynecology;  Laterality: Bilateral;  . WEIL OSTEOTOMY Right 07/07/2020   Procedure: RIGHT FOOT WEIL OSTEOTOMY 2, 3, AND 4 METATARSALS AND PROXIMAL INTERPHALANGEAL JOINT FUSION 2 & 4 TOES;  Surgeon: Newt Minion, MD;  Location: [facility];  Service:  Orthopedics;  Laterality: Right;    There were no vitals filed for this visit.   Subjective Assessment - 09/10/20 0804    Subjective Did OK after last visit.  Starting to get sme energy back from when I was sick. Not having alot of pain today but feel a little weak.    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 She as diagnosed in February 2022 with Multiple Myeloma not in remission. She is presently having palliative radiation for her back and pelvic pain, and has had 1 treatment of Zometa.  She will start chemotherapy on 08/17/20, She fatigues very easily.  She is presently out of work and just got out of a post op shoe for her right foot.  She is using a SPC corrected now to use with left UE. 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 30 minutes and must move.  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 5 minutes or less impacted also by recent right foot surgery and fatigue.    How long can you walk comfortably? not much walking    Diagnostic tests MRI/PET Scan    Patient Stated Goals to stay strong and stay mobile, stay positive.    Currently in  Pain? Yes    Pain Score 4     Pain Location Back    Pain Orientation Right    Pain Descriptors / Indicators Aching;Sore    Pain Onset More than a month ago    Pain Frequency Intermittent    Multiple Pain Sites No                             [facility] Adult PT Treatment/Exercise - 09/10/20 0001      Lumbar Exercises: Seated   Sit to Stand 10 reps   high table with rest between sets of 5   Sit to Stand Limitations no UE      Knee/Hip Exercises: Standing    Mini squats x 10 holding counter          Shoulder Exercises: Seated   Extension Strengthening;Both;20 reps   sets of 10   Theraband Level (Shoulder Extension) Level 1 (Yellow)    Row Strengthening;Both;20  reps    Theraband Level (Shoulder Row) Level 1 (Yellow)    External Rotation Strengthening;Both;10 reps    Theraband Level (Shoulder External Rotation) Level 1 (Yellow)                    PT Short Term Goals - 05/19/20 1432      PT SHORT TERM GOAL #1   Title STG=LTG             PT Long Term Goals - 08/05/20 1208      PT LONG TERM GOAL #1   Title Pt will be independent with all HEP given    Baseline No knowledge    Time 6    Period Weeks    Status New    Target Date 09/16/20      PT LONG TERM GOAL #2   Title Pt will be able to perform household chores without exacerbating back pain and using pain managment techniques    Baseline Pt unable to sweep or wash dishes for longer than 10 minutes    Period Weeks    Status New    Target Date 09/16/20      PT LONG TERM GOAL #3   Title Pt will be able to perform a 5 x sts in 20 sec or less showing improved LE strength and control without pain    Baseline 55 sec baseline    Time 6    Period Weeks    Status New    Target Date 09/16/20      PT LONG TERM GOAL #4   Title Pt will improve TUG time to 13 seconds or less to decrease risk of falls    Baseline 55 sec at eval    Time 6    Period Weeks    Status New    Target Date 09/16/20      PT LONG TERM GOAL #5   Title Pt will be able to hold each position of 4 position balance test for atleast 10 seconds without LOB    Baseline unable to hold tandem or SLS (right SLS not tested today secondary to recent surgery)    Time 6    Period Weeks    Status New    Target Date 09/16/20                 Plan - 09/10/20 0815    Clinical Impression Statement Pt performed 3 exercises and then requested to go  to the bathroom.  She was nauseaus and was unable to keep her food down.  She wanted to try exercising again, but upon standing became nauseaus and felt weak. She layed down with head of table slightly elevated and therapy was discontinued. at 8;45 pt was feeling better.   It was suggested she call her son to come and drive her, but she felt she was fine to drive.  I put a gait belt on for safety and walked her to the car. [facility]. Reminded pt that it is OK to cancel if she does not feel up to coming and that her body needs nutrition to be able to exercise.    Personal Factors and Comorbidities Comorbidity 3+    Comorbidities Multiple Myeloma, multi joint OA, recent foot surgery, fall risk    Examination-Activity Limitations Sleep;Squat;Stand;Stairs;Transfers;Locomotion Level;Sit    Examination-Participation Restrictions Cleaning;Community Activity;Occupation;Shop    Stability/Clinical Decision Making Evolving/Moderate complexity    Rehab Potential Good    PT Frequency 2x / week    PT Duration 6 weeks    PT Treatment/Interventions ADLs/Self Care Home Management;Aquatic Therapy;Iontophoresis 3m/ml Dexamethasone;Gait training;Stair training;Functional mobility training;Therapeutic activities;Therapeutic exercise;Balance training;Neuromuscular re-education;Manual techniques;Patient/family education;Passive range of motion;Joint Manipulations    PT Next Visit Plan Continue standing exs, cont. generalized strength, balance. mindful of radiation for back/pelvic/rib pain.  rest breaks prn    PT Home Exercise Plan meeks decompression, ppt, ppt with low march, standing heel raises, mini squats, hip abd, hip ext all at counter x 10 reps    Consulted and Agree with Plan of Care Patient           Patient will benefit from skilled therapeutic intervention in order to improve the following deficits and impairments:  Abnormal gait,Decreased mobility,Decreased activity tolerance,Decreased endurance,Decreased strength,Decreased balance,Decreased knowledge of precautions,Difficulty walking,Postural dysfunction,Pain  Visit Diagnosis: Muscle weakness (generalized)  Chronic low back pain with left-sided sciatica, unspecified back pain  laterality  Fatigue, unspecified type  Difficulty in walking, not elsewhere classified  Multiple myeloma not having achieved remission (HCC)  Stiffness of left hip, not elsewhere classified     Problem List Patient Active Problem List   Diagnosis Date Noted  . Multiple myeloma ([place] 07/13/2020  . Claw toe, right   . Trigger finger, left middle finger 04/09/2020  . Stenosing tenosynovitis of finger of left hand 02/27/2020  . Hemoglobin C trait ([place] 02/27/2018  . Major depression, recurrent ([place] 01/12/2018  . Chronic right shoulder pain 12/20/2017  . MDD (major depressive disorder), recurrent episode, moderate ([place] 11/04/2017  . MGUS (monoclonal gammopathy of unknown significance) 10/24/2017  . Primary osteoarthritis of both hands 09/26/2017  . Primary osteoarthritis of right shoulder 09/26/2017  . Primary osteoarthritis of both hips 09/26/2017  . Status post total knee replacement, right 09/26/2017  . Primary osteoarthritis of both feet 09/26/2017  . History of asthma 09/26/2017  . Primary osteoarthritis of left knee 04/18/2017  . Menorrhagia 11/04/2016  . Fibroid tumor 11/04/2016  . Adenomyosis 11/04/2016  . Hypertension, essential 11/04/2016  . Total knee replacement status 01/28/2016    RClaris Pong4/14/2022, 8:56 AM  [place] [address]Phone: [phone]  Fax:  [phone] Name: JALLEXIS BORDENAVEMRN: [id number] of Birth: 8July 29, 1962 RCheral Almas PT 09/10/20 8:59 AM

## 2020-09-10 NOTE — Progress Notes (Signed)
Critical Value: K 2.6 Dr Burr Medico notified Orders received  I spoke with Hannah [name] and per Dr Burr Medico she is to take 4 potassium tablets today and then 3 tablets daily.  Dr Burr Medico wants her to also get 12meq KCL IV.  Pt is unable to come in on Saturday for infusion. Reviewed with Dr Burr Medico.

## 2020-09-10 NOTE — Telephone Encounter (Signed)
CRITICAL VALUE STICKER  CRITICAL VALUE: Potassium = 2.6  RECEIVER (on-site recipient of call): Yetta Glassman, [place] NOTIFIED: 09/10/20 at 10:26am  MESSENGER (representative from lab):  MD NOTIFIED: Dr. Burr Medico  TIME OF NOTIFICATION: 09/10/20 at 10:30am  RESPONSE: Notification given to Tollie Pizza, RN for follow-up with provider and pt.

## 2020-09-11 ENCOUNTER — Encounter: Payer: Self-pay | Admitting: Hematology

## 2020-09-14 ENCOUNTER — Inpatient Hospital Stay: Payer: BC Managed Care – PPO

## 2020-09-14 ENCOUNTER — Telehealth: Payer: Self-pay | Admitting: *Deleted

## 2020-09-14 ENCOUNTER — Telehealth: Payer: Self-pay | Admitting: Hematology

## 2020-09-14 ENCOUNTER — Other Ambulatory Visit: Payer: Self-pay

## 2020-09-14 ENCOUNTER — Other Ambulatory Visit: Payer: BC Managed Care – PPO

## 2020-09-14 ENCOUNTER — Other Ambulatory Visit: Payer: Self-pay | Admitting: *Deleted

## 2020-09-14 ENCOUNTER — Ambulatory Visit: Payer: BC Managed Care – PPO | Admitting: Hematology

## 2020-09-14 VITALS — BP 118/82 | HR 100 | Temp 98.0°F | Resp 18 | Wt 198.2 lb

## 2020-09-14 DIAGNOSIS — F32A Depression, unspecified: Secondary | ICD-10-CM | POA: Diagnosis not present

## 2020-09-14 DIAGNOSIS — D509 Iron deficiency anemia, unspecified: Secondary | ICD-10-CM | POA: Diagnosis not present

## 2020-09-14 DIAGNOSIS — C9000 Multiple myeloma not having achieved remission: Secondary | ICD-10-CM

## 2020-09-14 DIAGNOSIS — Z5112 Encounter for antineoplastic immunotherapy: Secondary | ICD-10-CM | POA: Diagnosis not present

## 2020-09-14 DIAGNOSIS — M1990 Unspecified osteoarthritis, unspecified site: Secondary | ICD-10-CM | POA: Diagnosis not present

## 2020-09-14 DIAGNOSIS — F419 Anxiety disorder, unspecified: Secondary | ICD-10-CM | POA: Diagnosis not present

## 2020-09-14 DIAGNOSIS — E669 Obesity, unspecified: Secondary | ICD-10-CM | POA: Diagnosis not present

## 2020-09-14 DIAGNOSIS — I10 Essential (primary) hypertension: Secondary | ICD-10-CM | POA: Diagnosis not present

## 2020-09-14 LAB — CMP (CANCER CENTER ONLY)
ALT: 80 U/L — ABNORMAL HIGH (ref 0–44)
AST: 30 U/L (ref 15–41)
Albumin: 3.5 g/dL (ref 3.5–5.0)
Alkaline Phosphatase: 99 U/L (ref 38–126)
Anion gap: 14 (ref 5–15)
BUN: 11 mg/dL (ref 6–20)
CO2: 30 mmol/L (ref 22–32)
Calcium: 8.6 mg/dL — ABNORMAL LOW (ref 8.9–10.3)
Chloride: 94 mmol/L — ABNORMAL LOW (ref 98–111)
Creatinine: 0.8 mg/dL (ref 0.44–1.00)
GFR, Estimated: >60 mL/min (ref >60)
Glucose, Bld: 111 mg/dL — ABNORMAL HIGH (ref 70–99)
Potassium: 2.9 mmol/L — ABNORMAL LOW (ref 3.5–5.1)
Sodium: 138 mmol/L (ref 135–145)
Total Bilirubin: 0.8 mg/dL (ref 0.3–1.2)
Total Protein: 7.2 g/dL (ref 6.5–8.1)

## 2020-09-14 MED ORDER — DEXAMETHASONE 4 MG PO TABS
ORAL_TABLET | ORAL | Status: AC
  Filled 2020-09-14: qty 5

## 2020-09-14 MED ORDER — DEXAMETHASONE 4 MG PO TABS
20.0000 mg | ORAL_TABLET | Freq: Once | ORAL | Status: AC
  Administered 2020-09-14: 20 mg via ORAL

## 2020-09-14 MED ORDER — BORTEZOMIB CHEMO SQ INJECTION 3.5 MG (2.5MG/ML)
1.3000 mg/m2 | Freq: Once | INTRAMUSCULAR | Status: AC
  Administered 2020-09-14: 2.75 mg via SUBCUTANEOUS
  Filled 2020-09-14: qty 1.1

## 2020-09-14 NOTE — Telephone Encounter (Signed)
Potassium level 2.9 today. Sandi Mealy, PA reviewed the labs. No IV potassium today, take 4 potassium tablets today, then 3 per day until Thursday. Have labs repeated Thursday. Pt notified. Verbalized understanding.  Message to scheduler.

## 2020-09-14 NOTE — Telephone Encounter (Signed)
Scheduled appt per 4/18 sch msg. Pt aware.

## 2020-09-14 NOTE — Telephone Encounter (Signed)
Scheduled follow-up appointment per 4/14 los. Patient is aware. 

## 2020-09-15 ENCOUNTER — Other Ambulatory Visit: Payer: Self-pay

## 2020-09-15 ENCOUNTER — Telehealth: Payer: Self-pay

## 2020-09-15 ENCOUNTER — Ambulatory Visit: Payer: BC Managed Care – PPO

## 2020-09-15 ENCOUNTER — Ambulatory Visit: Payer: BC Managed Care – PPO | Admitting: Physical Therapy

## 2020-09-15 DIAGNOSIS — E860 Dehydration: Secondary | ICD-10-CM

## 2020-09-15 DIAGNOSIS — R112 Nausea with vomiting, unspecified: Secondary | ICD-10-CM

## 2020-09-15 DIAGNOSIS — E876 Hypokalemia: Secondary | ICD-10-CM

## 2020-09-15 LAB — KAPPA/LAMBDA LIGHT CHAINS
Kappa free light chain: 34.8 mg/L — ABNORMAL HIGH (ref 3.3–19.4)
Kappa, lambda light chain ratio: 4.64 — ABNORMAL HIGH (ref 0.26–1.65)
Lambda free light chains: 7.5 mg/L (ref 5.7–26.3)

## 2020-09-15 NOTE — Telephone Encounter (Signed)
Hannah [name] called this am stating she is nauseous and vomiting.  She was waiting for the zofran to kick in.  I reviewed alternating zofran and compazine.  appt made for this afternoon for IVF, zofran and IV KCL.  Hannah [name] did not come to her appt this afternoon.  I called her and she said she did not feel good.  She asks if she can come tomorrow.  Rescheduled for tomorrow at 1300.  I left Hannah [name] a vm with this information.

## 2020-09-16 ENCOUNTER — Other Ambulatory Visit: Payer: Self-pay

## 2020-09-16 ENCOUNTER — Inpatient Hospital Stay: Payer: BC Managed Care – PPO

## 2020-09-16 DIAGNOSIS — E669 Obesity, unspecified: Secondary | ICD-10-CM | POA: Diagnosis not present

## 2020-09-16 DIAGNOSIS — M1990 Unspecified osteoarthritis, unspecified site: Secondary | ICD-10-CM | POA: Diagnosis not present

## 2020-09-16 DIAGNOSIS — I10 Essential (primary) hypertension: Secondary | ICD-10-CM | POA: Diagnosis not present

## 2020-09-16 DIAGNOSIS — C9000 Multiple myeloma not having achieved remission: Secondary | ICD-10-CM | POA: Diagnosis not present

## 2020-09-16 DIAGNOSIS — E860 Dehydration: Secondary | ICD-10-CM

## 2020-09-16 DIAGNOSIS — F32A Depression, unspecified: Secondary | ICD-10-CM | POA: Diagnosis not present

## 2020-09-16 DIAGNOSIS — R112 Nausea with vomiting, unspecified: Secondary | ICD-10-CM

## 2020-09-16 DIAGNOSIS — F419 Anxiety disorder, unspecified: Secondary | ICD-10-CM | POA: Diagnosis not present

## 2020-09-16 DIAGNOSIS — E876 Hypokalemia: Secondary | ICD-10-CM

## 2020-09-16 DIAGNOSIS — D509 Iron deficiency anemia, unspecified: Secondary | ICD-10-CM | POA: Diagnosis not present

## 2020-09-16 DIAGNOSIS — Z5112 Encounter for antineoplastic immunotherapy: Secondary | ICD-10-CM | POA: Diagnosis not present

## 2020-09-16 LAB — MULTIPLE MYELOMA PANEL, SERUM
Albumin SerPl Elph-Mcnc: 3.5 g/dL (ref 2.9–4.4)
Albumin/Glob SerPl: 1.1 (ref 0.7–1.7)
Alpha 1: 0.3 g/dL (ref 0.0–0.4)
Alpha2 Glob SerPl Elph-Mcnc: 0.7 g/dL (ref 0.4–1.0)
B-Globulin SerPl Elph-Mcnc: 1.9 g/dL — ABNORMAL HIGH (ref 0.7–1.3)
Gamma Glob SerPl Elph-Mcnc: 0.4 g/dL (ref 0.4–1.8)
Globulin, Total: 3.2 g/dL (ref 2.2–3.9)
IgA: 1418 mg/dL — ABNORMAL HIGH (ref 87–352)
IgG (Immunoglobin G), Serum: 457 mg/dL — ABNORMAL LOW (ref 586–1602)
IgM (Immunoglobulin M), Srm: 10 mg/dL — ABNORMAL LOW (ref 26–217)
M Protein SerPl Elph-Mcnc: 1.2 g/dL — ABNORMAL HIGH
Total Protein ELP: 6.7 g/dL (ref 6.0–8.5)

## 2020-09-16 MED ORDER — ONDANSETRON HCL 4 MG/2ML IJ SOLN
INTRAMUSCULAR | Status: AC
  Filled 2020-09-16: qty 4

## 2020-09-16 MED ORDER — ONDANSETRON HCL 4 MG/2ML IJ SOLN
8.0000 mg | Freq: Once | INTRAMUSCULAR | Status: AC
  Administered 2020-09-16: 8 mg via INTRAVENOUS

## 2020-09-16 MED ORDER — POTASSIUM CHLORIDE 10 MEQ/100ML IV SOLN
10.0000 meq | INTRAVENOUS | Status: AC
  Administered 2020-09-16: 10 meq via INTRAVENOUS

## 2020-09-16 MED ORDER — SODIUM CHLORIDE 0.9 % IV SOLN
Freq: Once | INTRAVENOUS | Status: AC
  Filled 2020-09-16: qty 250

## 2020-09-16 MED ORDER — POTASSIUM CHLORIDE 10 MEQ/100ML IV SOLN
INTRAVENOUS | Status: AC
  Filled 2020-09-16: qty 100

## 2020-09-16 NOTE — Patient Instructions (Signed)
Dehydration, Adult Dehydration is condition in which there is not enough water or other fluids in the body. This happens when a person loses more fluids than he or she takes in. Important body parts cannot work right without the right amount of fluids. Any loss of fluids from the body can cause dehydration. Dehydration can be mild, worse, or very bad. It should be treated right away to keep it from getting very bad. What are the causes? This condition may be caused by:  Conditions that cause loss of water or other fluids, such as: ? Watery poop (diarrhea). ? Vomiting. ? Sweating a lot. ? Peeing (urinating) a lot.  Not drinking enough fluids, especially when you: ? Are ill. ? Are doing things that take a lot of energy to do.  Other illnesses and conditions, such as fever or infection.  Certain medicines, such as medicines that take extra fluid out of the body (diuretics).  Lack of safe drinking water.  Not being able to get enough water and food. What increases the risk? The following factors may make you more likely to develop this condition:  Having a long-term (chronic) illness that has not been treated the right way, such as: ? Diabetes. ? Heart disease. ? Kidney disease.  Being 65 years of age or older.  Having a disability.  Living in a place that is high above the ground or sea (high in altitude). The thinner, dried air causes more fluid loss.  Doing exercises that put stress on your body for a long time. What are the signs or symptoms? Symptoms of dehydration depend on how bad it is. Mild or worse dehydration  Thirst.  Dry lips or dry mouth.  Feeling dizzy or light-headed, especially when you stand up from sitting.  Muscle cramps.  Your body making: ? Dark pee (urine). Pee may be the color of tea. ? Less pee than normal. ? Less tears than normal.  Headache. Very bad dehydration  Changes in skin. Skin may: ? Be cold to the touch (clammy). ? Be blotchy  or pale. ? Not go back to normal right after you lightly pinch it and let it go.  Little or no tears, pee, or sweat.  Changes in vital signs, such as: ? Fast breathing. ? Low blood pressure. ? Weak pulse. ? Pulse that is more than 100 beats a minute when you are sitting still.  Other changes, such as: ? Feeling very thirsty. ? Eyes that look hollow (sunken). ? Cold hands and feet. ? Being mixed up (confused). ? Being very tired (lethargic) or having trouble waking from sleep. ? Short-term weight loss. ? Loss of consciousness. How is this treated? Treatment for this condition depends on how bad it is. Treatment should start right away. Do not wait until your condition gets very bad. Very bad dehydration is an emergency. You will need to go to a hospital.  Mild or worse dehydration can be treated at home. You may be asked to: ? Drink more fluids. ? Drink an oral rehydration solution (ORS). This drink helps get the right amounts of fluids and salts and minerals in the blood (electrolytes).  Very bad dehydration can be treated: ? With fluids through an IV tube. ? By getting normal levels of salts and minerals in your blood. This is often done by giving salts and minerals through a tube. The tube is passed through your nose and into your stomach. ? By treating the root cause. Follow these instructions at   home: Oral rehydration solution If told by your doctor, drink an ORS:  Make an ORS. Use instructions on the package.  Start by drinking small amounts, about  cup (120 mL) every 5-10 minutes.  Slowly drink more until you have had the amount that your doctor said to have. Eating and drinking  Drink enough clear fluid to keep your pee pale yellow. If you were told to drink an ORS, finish the ORS first. Then, start slowly drinking other clear fluids. Drink fluids such as: ? Water. Do not drink only water. Doing that can make the salt (sodium) level in your body get too low. ? Water  from ice chips you suck on. ? Fruit juice that you have added water to (diluted). ? Low-calorie sports drinks.  Eat foods that have the right amounts of salts and minerals, such as: ? Bananas. ? Oranges. ? Potatoes. ? Tomatoes. ? Spinach.  Do not drink alcohol.  Avoid: ? Drinks that have a lot of sugar. These include:  High-calorie sports drinks.  Fruit juice that you did not add water to.  Soda.  Caffeine. ? Foods that are greasy or have a lot of fat or sugar.         General instructions  Take over-the-counter and prescription medicines only as told by your doctor.  Do not take salt tablets. Doing that can make the salt level in your body get too high.  Return to your normal activities as told by your doctor. Ask your doctor what activities are safe for you.  Keep all follow-up visits as told by your doctor. This is important. Contact a doctor if:  You have pain in your belly (abdomen) and the pain: ? Gets worse. ? Stays in one place.  You have a rash.  You have a stiff neck.  You get angry or annoyed (irritable) more easily than normal.  You are more tired or have a harder time waking than normal.  You feel: ? Weak or dizzy. ? Very thirsty. Get help right away if you have:  Any symptoms of very bad dehydration.  Symptoms of vomiting, such as: ? You cannot eat or drink without vomiting. ? Your vomiting gets worse or does not go away. ? Your vomit has blood or green stuff in it.  Symptoms that get worse with treatment.  A fever.  A very bad headache.  Problems with peeing or pooping (having a bowel movement), such as: ? Watery poop that gets worse or does not go away. ? Blood in your poop (stool). This may cause poop to look black and tarry. ? Not peeing in 6-8 hours. ? Peeing only a small amount of very dark pee in 6-8 hours.  Trouble breathing. These symptoms may be an emergency. Do not wait to see if the symptoms will go away. Get  medical help right away. Call your local emergency services (911 in the [place] Do not drive yourself to the hospital. Summary  Dehydration is a condition in which there is not enough water or other fluids in the body. This happens when a person loses more fluids than he or she takes in.  Treatment for this condition depends on how bad it is. Treatment should be started right away. Do not wait until your condition gets very bad.  Drink enough clear fluid to keep your pee pale yellow. If you were told to drink an oral rehydration solution (ORS), finish the ORS first. Then, start slowly drinking other clear fluids.    Take over-the-counter and prescription medicines only as told by your doctor.  Get help right away if you have any symptoms of very bad dehydration. This information is not intended to replace advice given to you by your health care provider. Make sure you discuss any questions you have with your health care provider. Document Revised: 12/27/2018 Document Reviewed: 12/27/2018 Elsevier Patient Education  2021 Elsevier Inc.  

## 2020-09-17 ENCOUNTER — Inpatient Hospital Stay: Payer: BC Managed Care – PPO

## 2020-09-17 DIAGNOSIS — C9000 Multiple myeloma not having achieved remission: Secondary | ICD-10-CM

## 2020-09-17 DIAGNOSIS — F419 Anxiety disorder, unspecified: Secondary | ICD-10-CM | POA: Diagnosis not present

## 2020-09-17 DIAGNOSIS — F32A Depression, unspecified: Secondary | ICD-10-CM | POA: Diagnosis not present

## 2020-09-17 DIAGNOSIS — I10 Essential (primary) hypertension: Secondary | ICD-10-CM | POA: Diagnosis not present

## 2020-09-17 DIAGNOSIS — D509 Iron deficiency anemia, unspecified: Secondary | ICD-10-CM | POA: Diagnosis not present

## 2020-09-17 DIAGNOSIS — E669 Obesity, unspecified: Secondary | ICD-10-CM | POA: Diagnosis not present

## 2020-09-17 DIAGNOSIS — D472 Monoclonal gammopathy: Secondary | ICD-10-CM

## 2020-09-17 DIAGNOSIS — M1990 Unspecified osteoarthritis, unspecified site: Secondary | ICD-10-CM | POA: Diagnosis not present

## 2020-09-17 DIAGNOSIS — Z5112 Encounter for antineoplastic immunotherapy: Secondary | ICD-10-CM | POA: Diagnosis not present

## 2020-09-17 LAB — CMP (CANCER CENTER ONLY)
ALT: 100 U/L — ABNORMAL HIGH (ref 0–44)
AST: 45 U/L — ABNORMAL HIGH (ref 15–41)
Albumin: 3.5 g/dL (ref 3.5–5.0)
Alkaline Phosphatase: 97 U/L (ref 38–126)
Anion gap: 14 (ref 5–15)
BUN: 12 mg/dL (ref 6–20)
CO2: 30 mmol/L (ref 22–32)
Calcium: 7.8 mg/dL — ABNORMAL LOW (ref 8.9–10.3)
Chloride: 96 mmol/L — ABNORMAL LOW (ref 98–111)
Creatinine: 0.78 mg/dL (ref 0.44–1.00)
GFR, Estimated: >60 mL/min (ref >60)
Glucose, Bld: 89 mg/dL (ref 70–99)
Potassium: 2.8 mmol/L — ABNORMAL LOW (ref 3.5–5.1)
Sodium: 140 mmol/L (ref 135–145)
Total Bilirubin: 0.8 mg/dL (ref 0.3–1.2)
Total Protein: 6.9 g/dL (ref 6.5–8.1)

## 2020-09-18 ENCOUNTER — Other Ambulatory Visit: Payer: Self-pay

## 2020-09-18 DIAGNOSIS — C9000 Multiple myeloma not having achieved remission: Secondary | ICD-10-CM

## 2020-09-21 ENCOUNTER — Other Ambulatory Visit: Payer: Self-pay

## 2020-09-21 ENCOUNTER — Other Ambulatory Visit: Payer: Self-pay | Admitting: Hematology

## 2020-09-21 ENCOUNTER — Telehealth: Payer: Self-pay | Admitting: *Deleted

## 2020-09-21 ENCOUNTER — Inpatient Hospital Stay: Payer: BC Managed Care – PPO

## 2020-09-21 VITALS — BP 101/74 | HR 103 | Temp 98.3°F | Resp 18 | Wt 189.5 lb

## 2020-09-21 DIAGNOSIS — M1990 Unspecified osteoarthritis, unspecified site: Secondary | ICD-10-CM | POA: Diagnosis not present

## 2020-09-21 DIAGNOSIS — C9000 Multiple myeloma not having achieved remission: Secondary | ICD-10-CM

## 2020-09-21 DIAGNOSIS — Z5112 Encounter for antineoplastic immunotherapy: Secondary | ICD-10-CM | POA: Diagnosis not present

## 2020-09-21 DIAGNOSIS — F32A Depression, unspecified: Secondary | ICD-10-CM | POA: Diagnosis not present

## 2020-09-21 DIAGNOSIS — I10 Essential (primary) hypertension: Secondary | ICD-10-CM | POA: Diagnosis not present

## 2020-09-21 DIAGNOSIS — D509 Iron deficiency anemia, unspecified: Secondary | ICD-10-CM | POA: Diagnosis not present

## 2020-09-21 DIAGNOSIS — E669 Obesity, unspecified: Secondary | ICD-10-CM | POA: Diagnosis not present

## 2020-09-21 DIAGNOSIS — F419 Anxiety disorder, unspecified: Secondary | ICD-10-CM | POA: Diagnosis not present

## 2020-09-21 LAB — CMP (CANCER CENTER ONLY)
ALT: 103 U/L — ABNORMAL HIGH (ref 0–44)
AST: 52 U/L — ABNORMAL HIGH (ref 15–41)
Albumin: 3.8 g/dL (ref 3.5–5.0)
Alkaline Phosphatase: 94 U/L (ref 38–126)
Anion gap: 14 (ref 5–15)
BUN: 15 mg/dL (ref 6–20)
CO2: 31 mmol/L (ref 22–32)
Calcium: 8.8 mg/dL — ABNORMAL LOW (ref 8.9–10.3)
Chloride: 93 mmol/L — ABNORMAL LOW (ref 98–111)
Creatinine: 0.91 mg/dL (ref 0.44–1.00)
GFR, Estimated: >60 mL/min (ref >60)
Glucose, Bld: 101 mg/dL — ABNORMAL HIGH (ref 70–99)
Potassium: 2.5 mmol/L — CL (ref 3.5–5.1)
Sodium: 138 mmol/L (ref 135–145)
Total Bilirubin: 1 mg/dL (ref 0.3–1.2)
Total Protein: 7.1 g/dL (ref 6.5–8.1)

## 2020-09-21 LAB — CBC WITH DIFFERENTIAL (CANCER CENTER ONLY)
Abs Immature Granulocytes: 0.05 10*3/uL (ref 0.00–0.07)
Basophils Absolute: 0 10*3/uL (ref 0.0–0.1)
Basophils Relative: 0 %
Eosinophils Absolute: 0.2 10*3/uL (ref 0.0–0.5)
Eosinophils Relative: 4 %
HCT: 33.9 % — ABNORMAL LOW (ref 36.0–46.0)
Hemoglobin: 11.4 g/dL — ABNORMAL LOW (ref 12.0–15.0)
Immature Granulocytes: 1 %
Lymphocytes Relative: 38 %
Lymphs Abs: 1.9 10*3/uL (ref 0.7–4.0)
MCH: 24.6 pg — ABNORMAL LOW (ref 26.0–34.0)
MCHC: 33.6 g/dL (ref 30.0–36.0)
MCV: 73.1 fL — ABNORMAL LOW (ref 80.0–100.0)
Monocytes Absolute: 0.3 10*3/uL (ref 0.1–1.0)
Monocytes Relative: 6 %
Neutro Abs: 2.5 10*3/uL (ref 1.7–7.7)
Neutrophils Relative %: 51 %
Platelet Count: 117 10*3/uL — ABNORMAL LOW (ref 150–400)
RBC: 4.64 MIL/uL (ref 3.87–5.11)
RDW: 18.9 % — ABNORMAL HIGH (ref 11.5–15.5)
WBC Count: 4.9 10*3/uL (ref 4.0–10.5)
nRBC: 0 % (ref 0.0–0.2)

## 2020-09-21 MED ORDER — POTASSIUM CHLORIDE IN NACL 40-0.9 MEQ/L-% IV SOLN
INTRAVENOUS | Status: DC
  Filled 2020-09-21: qty 1000

## 2020-09-21 MED ORDER — POTASSIUM CHLORIDE IN NACL 40-0.9 MEQ/L-% IV SOLN
Freq: Once | INTRAVENOUS | Status: AC
  Filled 2020-09-21: qty 1000

## 2020-09-21 MED ORDER — POTASSIUM CHLORIDE IN NACL 40-0.9 MEQ/L-% IV SOLN
INTRAVENOUS | Status: AC
  Filled 2020-09-21: qty 1000

## 2020-09-21 MED ORDER — POTASSIUM CHLORIDE 10 MEQ/100ML IV SOLN
INTRAVENOUS | Status: AC
  Filled 2020-09-21: qty 400

## 2020-09-21 MED ORDER — BORTEZOMIB CHEMO SQ INJECTION 3.5 MG (2.5MG/ML)
1.3000 mg/m2 | Freq: Once | INTRAMUSCULAR | Status: AC
  Administered 2020-09-21: 2.75 mg via SUBCUTANEOUS
  Filled 2020-09-21: qty 1.1

## 2020-09-21 MED ORDER — DEXAMETHASONE 4 MG PO TABS
20.0000 mg | ORAL_TABLET | Freq: Once | ORAL | Status: AC
  Administered 2020-09-21: 20 mg via ORAL

## 2020-09-21 MED ORDER — DEXAMETHASONE 4 MG PO TABS
ORAL_TABLET | ORAL | Status: AC
  Filled 2020-09-21: qty 5

## 2020-09-21 NOTE — Telephone Encounter (Signed)
Provider made aware potassium 2.5. Request 40 mg IV of potassium IV during infusion. Made Maygan, RN aware.

## 2020-09-21 NOTE — Progress Notes (Signed)
Ok to treat with ALT of 103 and elevated HR of 105 per Truitt Merle, MD

## 2020-09-21 NOTE — Patient Instructions (Signed)
[facility]  Discharge Instructions: Thank you for choosing [facility] to provide your oncology and hematology care.   If you have a lab appointment with the [facility], please go directly to the [facility] and check in at the registration area.   Wear comfortable clothing and clothing appropriate for easy access to any Portacath or PICC line.   We strive to give you quality time with your provider. You may need to reschedule your appointment if you arrive late (15 or more minutes).  Arriving late affects you and other patients whose appointments are after yours.  Also, if you miss three or more appointments without notifying the office, you may be dismissed from the clinic at the provider's discretion.      For prescription refill requests, have your pharmacy contact our office and allow 72 hours for refills to be completed.    Today you received the following chemotherapy and/or immunotherapy agents velcade      To help prevent nausea and vomiting after your treatment, we encourage you to take your nausea medication as directed.  BELOW ARE SYMPTOMS THAT SHOULD BE REPORTED IMMEDIATELY: . *FEVER GREATER THAN 100.4 F (38 C) OR HIGHER . *CHILLS OR SWEATING . *NAUSEA AND VOMITING THAT IS NOT CONTROLLED WITH YOUR NAUSEA MEDICATION . *UNUSUAL SHORTNESS OF BREATH . *UNUSUAL BRUISING OR BLEEDING . *URINARY PROBLEMS (pain or burning when urinating, or frequent urination) . *BOWEL PROBLEMS (unusual diarrhea, constipation, pain near the anus) . TENDERNESS IN MOUTH AND THROAT WITH OR WITHOUT PRESENCE OF ULCERS (sore throat, sores in mouth, or a toothache) . UNUSUAL RASH, SWELLING OR PAIN  . UNUSUAL VAGINAL DISCHARGE OR ITCHING   Items with * indicate a potential emergency and should be followed up as soon as possible or go to the Emergency Department if any problems should occur.  Please show the CHEMOTHERAPY ALERT CARD or IMMUNOTHERAPY ALERT  CARD at check-in to the Emergency Department and triage nurse.  Should you have questions after your visit or need to cancel or reschedule your appointment, please contact [facility]  Dept: [phone]  and follow the prompts.  Office hours are 8:00 a.m. to 4:30 p.m. Monday - Friday. Please note that voicemails left after 4:00 p.m. may not be returned until the following business day.  We are closed weekends and major holidays. You have access to a nurse at all times for urgent questions. Please call the main number to the clinic Dept: [phone] and follow the prompts.   For any non-urgent questions, you may also contact your provider using MyChart. We now offer e-Visits for anyone 76 and older to request care online for non-urgent symptoms. For details visit [email or web address].   Also download the MyChart app! Go to the app store, search "MyChart", open the app, select [facility], and log in with your MyChart username and password.  Due to Covid, a mask is required upon entering the hospital/clinic. If you do not have a mask, one will be given to you upon arrival. For doctor visits, patients may have 1 support person aged 42 or older with them. For treatment visits, patients cannot have anyone with them due to current Covid guidelines and our immunocompromised population.  Bortezomib injection What is this medicine? BORTEZOMIB (bor TEZ oh mib) targets proteins in cancer cells and stops the cancer cells from growing. It treats multiple myeloma and mantle cell lymphoma. This medicine may be used for other purposes; ask  your health care provider or pharmacist if you have questions. COMMON BRAND NAME(S): Velcade What should I tell my health care provider before I take this medicine? They need to know if you have any of these conditions:  dehydration  diabetes (high blood sugar)  heart disease  liver disease  tingling of the fingers or toes or other  nerve disorder  an unusual or allergic reaction to bortezomib, mannitol, boron, other medicines, foods, dyes, or preservatives  pregnant or trying to get pregnant  breast-feeding How should I use this medicine? This medicine is injected into a vein or under the skin. It is given by a health care provider in a hospital or clinic setting. Talk to your health care provider about the use of this medicine in children. Special care may be needed. Overdosage: If you think you have taken too much of this medicine contact a poison control center or emergency room at once. NOTE: This medicine is only for you. Do not share this medicine with others. What if I miss a dose? Keep appointments for follow-up doses. It is important not to miss your dose. Call your health care provider if you are unable to keep an appointment. What may interact with this medicine? This medicine may interact with the following medications:  ketoconazole  rifampin This list may not describe all possible interactions. Give your health care provider a list of all the medicines, herbs, non-prescription drugs, or dietary supplements you use. Also tell them if you smoke, drink alcohol, or use illegal drugs. Some items may interact with your medicine. What should I watch for while using this medicine? Your condition will be monitored carefully while you are receiving this medicine. You may need blood work done while you are taking this medicine. You may get drowsy or dizzy. Do not drive, use machinery, or do anything that needs mental alertness until you know how this medicine affects you. Do not stand up or sit up quickly, especially if you are an older patient. This reduces the risk of dizzy or fainting spells This medicine may increase your risk of getting an infection. Call your health care provider for advice if you get a fever, chills, sore throat, or other symptoms of a cold or flu. Do not treat yourself. Try to avoid being  around people who are sick. Check with your health care provider if you have severe diarrhea, nausea, and vomiting, or if you sweat a lot. The loss of too much body fluid may make it dangerous for you to take this medicine. Do not become pregnant while taking this medicine or for 7 months after stopping it. Women should inform their health care provider if they wish to become pregnant or think they might be pregnant. Men should not father a child while taking this medicine and for 4 months after stopping it. There is a potential for serious harm to an unborn child. Talk to your health care provider for more information. Do not breast-feed an infant while taking this medicine or for 2 months after stopping it. This medicine may make it more difficult to get pregnant or father a child. Talk to your health care provider if you are concerned about your fertility. What side effects may I notice from receiving this medicine? Side effects that you should report to your doctor or health care professional as soon as possible:  allergic reactions (skin rash; itching or hives; swelling of the face, lips, or tongue)  bleeding (bloody or black, tarry stools;  red or dark brown urine; spitting up blood or brown material that looks like coffee grounds; red spots on the skin; unusual bruising or bleeding from the eye, gums, or nose)  blurred vision or changes in vision  confusion  constipation  headache  heart failure (trouble breathing; fast, irregular heartbeat; sudden weight gain; swelling of the ankles, feet, hands)  infection (fever, chills, cough, sore throat, pain or trouble passing urine)  lack or loss of appetite  liver injury (dark yellow or brown urine; general ill feeling or flu-like symptoms; loss of appetite, right upper belly pain; yellowing of the eyes or skin)  low blood pressure (dizziness; feeling faint or lightheaded, falls; unusually weak or tired)  muscle cramps  pain, redness, or  irritation at site where injected  pain, tingling, numbness in the hands or feet  seizures  trouble breathing  unusual bruising or bleeding Side effects that usually do not require medical attention (report to your doctor or health care professional if they continue or are bothersome):  diarrhea  nausea  stomach pain  trouble sleeping  vomiting This list may not describe all possible side effects. Call your doctor for medical advice about side effects. You may report side effects to FDA at 1-800-FDA-1088. Where should I keep my medicine? This medicine is given in a hospital or clinic. It will not be stored at home. NOTE: This sheet is a summary. It may not cover all possible information. If you have questions about this medicine, talk to your doctor, pharmacist, or health care provider.  2021 Elsevier/Gold Standard (2020-05-07 13:22:53)

## 2020-09-22 ENCOUNTER — Other Ambulatory Visit: Payer: Self-pay

## 2020-09-22 ENCOUNTER — Encounter: Payer: Self-pay | Admitting: Hematology

## 2020-09-22 ENCOUNTER — Ambulatory Visit: Payer: BC Managed Care – PPO

## 2020-09-22 ENCOUNTER — Other Ambulatory Visit: Payer: Self-pay | Admitting: Physician Assistant

## 2020-09-22 DIAGNOSIS — C9000 Multiple myeloma not having achieved remission: Secondary | ICD-10-CM | POA: Diagnosis not present

## 2020-09-22 DIAGNOSIS — R262 Difficulty in walking, not elsewhere classified: Secondary | ICD-10-CM | POA: Diagnosis not present

## 2020-09-22 DIAGNOSIS — M5442 Lumbago with sciatica, left side: Secondary | ICD-10-CM | POA: Diagnosis not present

## 2020-09-22 DIAGNOSIS — M25652 Stiffness of left hip, not elsewhere classified: Secondary | ICD-10-CM | POA: Diagnosis not present

## 2020-09-22 DIAGNOSIS — M6281 Muscle weakness (generalized): Secondary | ICD-10-CM | POA: Diagnosis not present

## 2020-09-22 DIAGNOSIS — R5383 Other fatigue: Secondary | ICD-10-CM | POA: Diagnosis not present

## 2020-09-22 DIAGNOSIS — G8929 Other chronic pain: Secondary | ICD-10-CM | POA: Diagnosis not present

## 2020-09-22 DIAGNOSIS — R112 Nausea with vomiting, unspecified: Secondary | ICD-10-CM

## 2020-09-22 DIAGNOSIS — R252 Cramp and spasm: Secondary | ICD-10-CM | POA: Diagnosis not present

## 2020-09-22 MED ORDER — PROCHLORPERAZINE MALEATE 10 MG PO TABS: 10.0000 mg | ORAL_TABLET | Freq: Four times a day (QID) | ORAL | 3 refills | Status: DC | PRN

## 2020-09-22 MED ORDER — ONDANSETRON HCL 8 MG PO TABS: 8.0000 mg | ORAL_TABLET | Freq: Three times a day (TID) | ORAL | 3 refills | Status: DC | PRN

## 2020-09-22 NOTE — Therapy (Signed)
[place], [place], [address] Phone: [phone]   Fax:  [phone]  Physical Therapy Treatment  Patient Details  Name: Hannah [name] MRN: [id number] Date of Birth: 07/21/1960 Referring Provider (PT): Cira Rue   Encounter Date: 09/22/2020   PT End of Session - 09/22/20 0853    Visit Number 9    Number of Visits 24    Date for PT Re-Evaluation 12/14/20    Authorization Type [employer]    PT Start Time 0806    PT Stop Time 0846    PT Time Calculation (min) 40 min    Activity Tolerance Treatment limited secondary to medical complications (Comment)   Didn't feel able to do sit to stand test secondary to nausea.  RX limited secondary to nausea   Behavior During Therapy [facility] for tasks assessed/performed           Past Medical History:  Diagnosis Date  . Allergy   . Anemia   . Anxiety   . Arthritis   . Asthma   . Depression   . Hypertension     Past Surgical History:  Procedure Laterality Date  . CHOLECYSTECTOMY  1993  . COLONOSCOPY    . 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 TOTAL KNEE ARTHROPLASTY Right 01/28/2016   Procedure: RIGHT TOTAL KNEE ARTHROPLASTY;  Surgeon: [name], MD;  Location: [place];  Service: Orthopedics;  Laterality: Right;  . TRIGGER FINGER RELEASE Right 06/18/2014   Procedure: RIGHT LONG FINGER TRIGGER RELEASE;  Surgeon: [name], MD;  Location: [place];  Service: Orthopedics;  Laterality: Right;  . TUBAL LIGATION    . VAGINAL HYSTERECTOMY Bilateral 11/04/2016   Procedure: HYSTERECTOMY VAGINAL with Bilateral Salpingectomy;  Surgeon: [name], MD;  Location: [facility];  Service: Gynecology;  Laterality: Bilateral;  . WEIL OSTEOTOMY Right 07/07/2020   Procedure: RIGHT FOOT WEIL OSTEOTOMY 2, 3, AND 4 METATARSALS AND PROXIMAL INTERPHALANGEAL JOINT FUSION 2 & 4 TOES;  Surgeon: Newt Minion, MD;  Location: [place];   Service: Orthopedics;  Laterality: Right;    There were no vitals filed for this visit.   Subjective Assessment - 09/22/20 0808    Subjective Had an IV yesterday because I was dehydrated and pottasium was low.   Still having trouble with nausea and have some trouble keeping meds down.  Still feel nauseaus today.  I have nausea medicine but sometimes I can't keep it down. Haven't been able to do much at home because of the dehydration.  I do some of the bed exercises.  Not as much pain today 3-4/10 right hip.No information yet on how she is responding to treatment.  Haven't had any scans yet. Pt. has chemo on for 14 days and off for 7 days.  After conversing decided she might do better to do her therapy in her off weeks so she can tolerate treatment better.    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 She as diagnosed in February 2022 with Multiple Myeloma not in remission. She is presently having palliative radiation for her back and pelvic pain, and has had 1 treatment of Zometa.  She will start chemotherapy on 08/17/20, She fatigues very easily.  She is presently out of work and just got out of a post op shoe for her right foot.  She is using a SPC corrected now to use with left UE. 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 30 minutes and must move.  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 5 min or less    How long can you walk comfortably? very limited more by meds and feeling bad    Diagnostic tests no scans scheduled recently    Currently in Pain? Yes    Pain Score 4     Pain Location Back    Pain Orientation Right;Left    Pain Descriptors / Indicators Aching;Sore    Pain Type Chronic pain    Pain Radiating Towards no longer has radiating pain    Pain Onset More than a month ago    Pain Frequency Intermittent    Effect of  Pain on Daily Activities limits activities    Multiple Pain Sites No              [facility] PT Assessment - 09/22/20 0001      Assessment   Medical Diagnosis Multiple Myeloma    Referring Provider (PT) Cira Rue    Hand Dominance Right    Prior Therapy yes      Precautions   Precaution Comments multiple myeloma      Sit to Stand   Comments unable to perform today secondary to nausea      Strength   Right Hip Flexion 4/5   pain in hip   Right Hip ABduction 4/5   sitting   Left Hip Extension --   sitting   Left Hip ABduction 4/5   sitting   Right Knee Flexion 4+/5    Right Knee Extension 4+/5    Left Knee Flexion 4+/5    Left Knee Extension 4+/5      Ambulation/Gait   Assistive device Straight cane    Gait Pattern Step-through pattern;Decreased step length - left;Decreased stance time - right;Decreased weight shift to right    Gait velocity slow    Gait Comments TUG without AD 19 sec      Balance   Balance Assessed Yes    Balance comment 4 position balance test.  # trials each of tandem and SLS.  Unable to maintain any position for 10 seconds                         [facility] Adult PT Treatment/Exercise - 09/22/20 0001      Knee/Hip Exercises: Standing   Knee Flexion Strengthening;Right;Left;1 set;10 reps   sitting with yellow TB   Other Standing Knee Exercises knee extension sitting, Right/left x 10 ea with yellow band      Shoulder Exercises: Seated   Extension Strengthening;Both;10 reps    Theraband Level (Shoulder Extension) Level 1 (Yellow)    Row Strengthening;20 reps;10 reps    Theraband Level (Shoulder Row) Level 1 (Yellow)    External Rotation Strengthening;Both;10 reps    Theraband Level (Shoulder External Rotation) Level 1 (Yellow)    Other Seated Exercises elbow flexion yellow TB x 10                    PT Short Term Goals - 05/19/20 1432      PT SHORT TERM GOAL #1   Title STG=LTG             PT Long Term Goals -  09/22/20 7588  PT LONG TERM GOAL #1   Title Pt will be independent with all HEP given    Baseline does what she can do, but mostly bed exercises because of nausea    Time 6    Period Weeks    Status Achieved    Target Date 09/22/20      PT LONG TERM GOAL #2   Title Pt will be able to perform household chores without exacerbating back pain and using pain managment techniques    Baseline Pt unable to sweep or wash dishes for longer than 10 minutes    Time 12    Status On-going    Target Date 12/14/20      PT LONG TERM GOAL #3   Title Pt will be able to perform a 5 x sts in 20 sec or less showing improved LE strength and control without pain    Baseline unable to try today secondary to nausea    Time 12    Period Weeks    Status On-going    Target Date 12/14/20      PT LONG TERM GOAL #4   Title Pt will improve TUG time to 13 seconds or less to decrease risk of falls    Baseline 19 sec today    Time 12    Period Weeks    Status On-going    Target Date 12/14/20      PT LONG TERM GOAL #5   Title Pt will be able to hold each position of 4 position balance test for atleast 10 seconds without LOB    Baseline unable to hold tandem or SLS (right SLS not tested today secondary to recent surgery), 09/22/20  unable to hold for 10 seconds all, but imporved slightly with repetition    Time 12    Period Weeks    Status On-going    Target Date 12/14/20                 Plan - 09/22/20 0855    Clinical Impression Statement Pt had an IV yesterday and felt OK when she left the house this am but by the time she arrived she was nauseaus again.  Recertification was done and items retested as pt felt able.  She is compliant with HEP on days she feels up to it and mainly performs bed exercises.  She did improve significantly with her TUG test from 33 seconds at evaluation to 19 seconds today without her cane.  We redid tandem stance and SLS bilaterally however with 3 trials of each she was  unable to maintain any of the positions for 10 seconds.  Pt is a willing participant but has been limited by nausea and just plain feeling bad.  We are going to schedule her appointments in the weeks she has off from chemo, and she will call other weeks if she is having a good day to be scheduled at short notice if we can work her in. She is not due for any scans yet to determine her improvement with her chemo.    Personal Factors and Comorbidities Comorbidity 3+    Comorbidities Multiple Myeloma, multi joint OA, recent foot surgery, fall risk    Examination-Activity Limitations Sleep;Squat;Stand;Stairs;Transfers;Locomotion Level;Sit    Examination-Participation Restrictions Cleaning;Community Activity;Occupation;Shop    Stability/Clinical Decision Making Evolving/Moderate complexity    Rehab Potential Good    PT Frequency 2x / week   pt to schedule 2x's per week on off weeks from chemo, and will  call on other weeks to come in if she feels up to it.   PT Duration 12 weeks    PT Treatment/Interventions ADLs/Self Care Home Management;Aquatic Therapy;Gait training;Stair training;Functional mobility training;Therapeutic activities;Therapeutic exercise;Balance training;Neuromuscular re-education;Manual techniques;Patient/family education;Passive range of motion;Joint Manipulations    PT Next Visit Plan Continue standing exs, cont. generalized strength, balance. mindful of radiation for back/pelvic/rib pain.  rest breaks prn    PT Home Exercise Plan meeks decompression, ppt, ppt with low march, standing heel raises, mini squats, hip abd, hip ext all at counter x 10 reps    Consulted and Agree with Plan of Care Patient           Patient will benefit from skilled therapeutic intervention in order to improve the following deficits and impairments:  Abnormal gait,Decreased mobility,Decreased activity tolerance,Decreased endurance,Decreased strength,Decreased balance,Decreased knowledge of  precautions,Difficulty walking,Postural dysfunction,Pain  Visit Diagnosis: Muscle weakness (generalized)  Chronic low back pain with left-sided sciatica, unspecified back pain laterality  Fatigue, unspecified type  Difficulty in walking, not elsewhere classified  Multiple myeloma not having achieved remission (HCC)  Stiffness of left hip, not elsewhere classified     Problem List Patient Active Problem List   Diagnosis Date Noted  . Multiple myeloma ([place]) 07/13/2020  . Claw toe, right   . Trigger finger, left middle finger 04/09/2020  . Stenosing tenosynovitis of finger of left hand 02/27/2020  . 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 Major depression, recurrent ([place]) 01/12/2018  . Chronic right shoulder pain 12/20/2017  . MDD (major depressive disorder), recurrent episode, moderate ([place]) 11/04/2017  . MGUS (monoclonal gammopathy of unknown significance) 10/24/2017  . Primary osteoarthritis of both hands 09/26/2017  . Primary osteoarthritis of right shoulder 09/26/2017  . Primary osteoarthritis of both hips 09/26/2017  . Status post total knee replacement, right 09/26/2017  . Primary osteoarthritis of both feet 09/26/2017  . History of asthma 09/26/2017  . Primary osteoarthritis of left knee 04/18/2017  . Menorrhagia 11/04/2016  . Fibroid tumor 11/04/2016  . Adenomyosis 11/04/2016  . Hypertension, essential 11/04/2016  . Total knee replacement status 01/28/2016    Claris Pong 09/22/2020, 10:51 AM  [place], [place], [address] Phone: [phone]   Fax:  [phone]  Name: Hannah [name] MRN: [id number] Date of Birth: [birth year]  Cheral Almas, PT 09/22/20 10:54 AM

## 2020-09-23 NOTE — Progress Notes (Signed)
[place]   Telephone:(336) [phone] [phone]   Clinic Follow up Note   Patient Care Team: Tisovec, Fransico Him, MD as PCP - General (Internal Medicine) Bo Merino, MD as Consulting Physician (Rheumatology)  Date of Service:  09/28/2020  CHIEF COMPLAINT: f/u of multiple myeloma  SUMMARY OF ONCOLOGIC HISTORY: Oncology History Overview Note  Cancer Staging Multiple myeloma ([place]) Staging form: Plasma Cell Myeloma and Plasma Cell Disorders, AJCC 8th Edition - Clinical stage from 07/20/2020: Beta-2-microglobulin (mg/L): 2.4, Albumin (g/dL): 3.2, ISS: Stage II, High-risk cytogenetics: Unknown, LDH: Normal - Signed by Truitt Merle, MD on 08/02/2020 Beta 2 microglobulin range (mg/L): Less than 3.5 Albumin range (g/dL): Less than 3.5 Cytogenetics: Unknown    Multiple myeloma ([place])  07/03/2020 Imaging   MRI Lumbar Spine  IMPRESSION: 1. Numerous T1 hypointense and STIR hyperintense lesions throughout the visualized spine and sacrum with multiple areas of extraosseous extension in the sacrum, detailed above and compatible with multiple myeloma given the clinical history. 2. An MRI of the pelvis with contrast could evaluate the full extent of the partially imaged sacral lesions, including left S1-S2 neural foraminal involvement and suspected involvement of the exited left L5 nerve. 3. Multilevel degenerative change without significant canal or foraminal stenosis in the lumbar spine.   07/13/2020 Initial Diagnosis   Multiple myeloma ([place])   07/20/2020 Cancer Staging   Staging form: Plasma Cell Myeloma and Plasma Cell Disorders, AJCC 8th Edition - Clinical stage from 07/20/2020: Beta-2-microglobulin (mg/L): 2.4, Albumin (g/dL): 3.2, ISS: Stage II, High-risk cytogenetics: Unknown, LDH: Normal - Signed by Truitt Merle, MD on 08/31/2020 Beta 2 microglobulin range (mg/L): Less than 3.5 Albumin range (g/dL): Less than 3.5 Cytogenetics: No abnormalities Bone disease on  imaging: Present   07/24/2020 PET scan   IMPRESSION: 1. Moderate to high hypermetabolic activity associated with the expansile soft tissue masses within the LEFT and RIGHT pelvic bones is consistent with active multiple myeloma / plasmacytoma. 2. Lytic lesion in the LEFT scapula with mild metabolic activity is indeterminate. 3. Metabolic activity associated with the RIGHT knee prosthetic and RIGHT distal foot fusion is favored post procedural inflammation.     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  The findings are most consistent with  persistent/recurrent/previously known plasma cell neoplasm.  For  completeness, immunohistochemical stain for CD138 and in situ  hybridization for kappa and lambda will be performed and the results  reported in an addendum.  Correlation with cytogenetic and FISH studies  is recommended.   08/03/2020 -  Chemotherapy   Zometa starting 08/03/20    08/17/2020 -  Chemotherapy   PENDING Velcade weekly, Revlimid, dex $RemoveBefor'20mg'CqFNYtGnwGYp$  weekly (VRd)  Starting 08/17/20.        CURRENT THERAPY:  Zometa starting 08/03/20 Velcade, Revlimid, dex(VRd)2weeks on/1 week offstarting 08/17/20.  INTERVAL HISTORY:  Hannah [name] is here for a follow up of MM. She was last seen by me 09/10/20. She presents to the clinic alone. She notes her pain is  tolerable. She notes she is tolerating treatment well. She denies neuropathy. I reviewed her  medication list with her. She notes she is off Revlimid this week. She notes her energy is mildly better on her off week. She notes she is not able to do much at home. She tries to do PT, but that is hard to do when on Revlimid so she goes on her off week.  She notes she still has dizziness and lightheadedness. This mostly happens when walking around. She is not on HTN meds. She notes she tries to dink more. She has added Gatorade. She continues to use antiemetics to prevent nausea. She notes she is not eating as much due to taste change. She notes she has been more diarrhea about 3 BM daily.   REVIEW OF SYSTEMS:   Constitutional: Denies fevers, chills or abnormal weight loss (+) Fatigue  Eyes: Denies blurriness of vision Ears, nose, mouth, throat, and face: Denies mucositis or sore throat Respiratory: Denies cough, dyspnea or wheezes Cardiovascular: Denies palpitation, chest discomfort or lower extremity swelling Gastrointestinal:  Denies nausea, heartburn or change in bowel habits Skin: Denies abnormal skin rashes Lymphatics: Denies new lymphadenopathy or easy bruising Neurological:Denies numbness, tingling or new weaknesses Behavioral/Psych: Mood is stable, no new changes  All other systems were reviewed with the patient and are negative.  MEDICAL HISTORY:  Past Medical History:  Diagnosis Date  . Allergy   . Anemia   . Anxiety   . Arthritis   . Asthma   . Depression   . Hypertension     SURGICAL HISTORY: Past Surgical History:  Procedure Laterality Date  . CHOLECYSTECTOMY  1993  . COLONOSCOPY    . 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 TOTAL KNEE ARTHROPLASTY Right 01/28/2016   Procedure: RIGHT TOTAL KNEE ARTHROPLASTY;  Surgeon: [name], MD;  Location: [facility];  Service: Orthopedics;  Laterality: Right;  . TRIGGER FINGER RELEASE Right 06/18/2014   Procedure: RIGHT LONG FINGER TRIGGER RELEASE;  Surgeon: [name], MD;  Location: [facility];   Service: Orthopedics;  Laterality: Right;  . TUBAL LIGATION    . VAGINAL HYSTERECTOMY Bilateral 11/04/2016   Procedure: HYSTERECTOMY VAGINAL with Bilateral Salpingectomy;  Surgeon: [name], MD;  Location: [facility];  Service: Gynecology;  Laterality: Bilateral;  . WEIL OSTEOTOMY Right 07/07/2020   Procedure: RIGHT FOOT WEIL OSTEOTOMY 2, 3, AND 4 METATARSALS AND PROXIMAL INTERPHALANGEAL JOINT FUSION 2 & 4 TOES;  Surgeon: Newt Minion, MD;  Location: [facility];  Service: Orthopedics;  Laterality: Right;    I have reviewed the social history and family history with the patient and they are unchanged from previous note.  ALLERGIES:  is allergic to elemental sulfur.  MEDICATIONS:  Current Outpatient Medications  Medication Sig Dispense Refill  . albuterol (VENTOLIN HFA) 108 (90 Base) MCG/ACT inhaler Inhale into the lungs.    . budesonide-formoterol (SYMBICORT) 160-4.5 MCG/ACT inhaler Inhale 2 puffs into the lungs daily as needed.    . calcium carbonate (OSCAL) 1500 (600 Ca) MG TABS tablet Take 600 mg of elemental calcium by mouth daily.    . Cholecalciferol (VITAMIN D3) 1.25 MG (50000 UT) CAPS Take 1 capsule by mouth once a week.    . ferrous sulfate 325 (65 FE) MG tablet Take 325 mg by mouth 2 (two) times daily with a meal.    . KLOR-CON M20 20 MEQ tablet TAKE 1 TABLET BY MOUTH TWICE A DAY 60 tablet 0  .  lenalidomide (REVLIMID) 25 MG capsule Take 1 capsule by mouth for 14 days and then 0 capsules for 7 days 14 capsule 0  . ondansetron (ZOFRAN) 8 MG tablet Take 1 tablet (8 mg total) by mouth every 8 (eight) hours as needed for nausea or vomiting. 20 tablet 3  . pantoprazole (PROTONIX) 20 MG tablet TAKE 1 TABLET BY MOUTH EVERY DAY 30 tablet 0  . prochlorperazine (COMPAZINE) 10 MG tablet Take 1 tablet (10 mg total) by mouth every 6 (six) hours as needed for nausea or vomiting. 30 tablet 3  . valACYclovir (VALTREX) 500 MG tablet Take 500 mg by mouth 2 (two) times daily.    .  Vitamin D, Ergocalciferol, (DRISDOL) 50000 units CAPS capsule Take 50,000 Units by mouth every Monday.      No current facility-administered medications for this visit.    PHYSICAL EXAMINATION: ECOG PERFORMANCE STATUS: 3 - Symptomatic, >50% confined to bed  Vitals:   09/28/20 1244  BP: 101/65  Pulse: (!) 104  Resp: 18  Temp: (!) 97.4 F (36.3 C)  SpO2: 97%   Filed Weights   09/28/20 1244  Weight: 190 lb 14.4 oz (86.6 kg)    GENERAL:alert, no distress and comfortable SKIN: skin color, texture, turgor are normal, no rashes or significant lesions EYES: normal, Conjunctiva are pink and non-injected, sclera clear  NECK: supple, thyroid normal size, non-tender, without nodularity LYMPH:  no palpable lymphadenopathy in the cervical, axillary  LUNGS: clear to auscultation and percussion with normal breathing effort HEART: regular rate & rhythm and no murmurs and no lower extremity edema ABDOMEN:abdomen soft, non-tender and normal bowel sounds Musculoskeletal:no cyanosis of digits and no clubbing  NEURO: alert & oriented x 3 with fluent speech, no focal motor/sensory deficits EXAM DONE IN WHEELCHAIR TODAY   LABORATORY DATA:  I have reviewed the data as listed CBC Latest Ref Rng & Units 09/28/2020 09/21/2020 09/10/2020  WBC 4.0 - 10.5 K/uL 4.4 4.9 3.9(L)  Hemoglobin 12.0 - 15.0 g/dL 11.0(L) 11.4(L) 11.0(L)  Hematocrit 36.0 - 46.0 % 32.8(L) 33.9(L) 32.9(L)  Platelets 150 - 400 K/uL 133(L) 117(L) 177     CMP Latest Ref Rng & Units 09/28/2020 09/21/2020 09/17/2020  Glucose 70 - 99 mg/dL 95 101(H) 89  BUN 6 - 20 mg/dL $Remove'9 15 12  'Nmiklcm$ Creatinine 0.44 - 1.00 mg/dL 0.79 0.91 0.78  Sodium 135 - 145 mmol/L 142 138 140  Potassium 3.5 - 5.1 mmol/L 3.3(L) 2.5(LL) 2.8(L)  Chloride 98 - 111 mmol/L 100 93(L) 96(L)  CO2 22 - 32 mmol/L $RemoveB'31 31 30  'OygHuLql$ Calcium 8.9 - 10.3 mg/dL 8.6(L) 8.8(L) 7.8(L)  Total Protein 6.5 - 8.1 g/dL 6.3(L) 7.1 6.9  Total Bilirubin 0.3 - 1.2 mg/dL 1.2 1.0 0.8  Alkaline Phos 38 - 126  U/L 94 94 97  AST 15 - 41 U/L 78(H) 52(H) 45(H)  ALT 0 - 44 U/L 149(H) 103(H) 100(H)      RADIOGRAPHIC STUDIES: I have personally reviewed the radiological images as listed and agreed with the findings in the report. No results found.   ASSESSMENT & PLAN:  Hannah [name] is a 60 y.o. 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 I previouslydiscussed with pathologist Dr. Gari Crown, the increased plasma cells in bone marrow is likely between 10 to 20%, and would favorsmolderingmultiple myeloma than  MGUS. She initially did not meet CRAB Criteria for MM.  -After increase of IgA, light chain and M-protein (3.3) on 06/01/20 labs,multiple T1 lesions in her lumbarspine and sacrumon 07/03/20 MRI, hypermetabolic uptake inmultiplebone lesion on 07/24/20 PET,she meets thediagnostic criteria for multiple myeloma -She underwent a bone marrow biopsy last week, which showed 31%plasmacells.Cytology and FISH was negative gene mutations,which indicating standard risk disease.I discussed with hertoday. Hannah [name] completedpalliative radiation to sacral bone lesions -She has started inductionchemotherapy withVRd (Velcade, Revlimid2 weeks on, and 1 week off and dexamethasone), she is acandidate for bone marrow transplant.We refered her to [facility] bone marrow transplant program, but she has not yet been contacted. Will f/u on that  -Her 09/14/20 MM panel showed her M-protein decreased to 1.2, IgA and light chains have significantly decreased, indicating excellent response to treatment.  -She is tolerating treatment well except fatigue and some nausea. This mildly improves on the off week of Revlimid.  -Labs reviewed, Hg 11, plt 133K. Overall adequate to proceed with Velcade today.  Continue with off week of Revlimid.  -f/u in 2 weeks then every 3 weeks.    2. Low back pain, right hip pain,Mid right back pain,secondary to MM -During work up with orthopedic surgeon, she was found to have multiple T1 lesions of her spine and sacrum compatible with MM on her 07/03/20 MRI lumbar spine.These were hypermetabolic on 3/61/22 PET. -For pain management she ison Norcoas needed. Stable and controlled.  Hannah [name] completed her course of palliative radiation, pain is much improved. -She continues with physical therapy, as tolerated. Best to be done on her off week.  -She started Zometa on 08/03/20 to help strengthen her bones.We will continue monthly. She will continue calcium and vitamin D supplement    3. Nausea, Diarrhea, Low appetite, hypokalemia  -Nausea is prevented with antiemetics q8hours. Will continue.  -On oral potassium TID -She notes due to taste change she has low appetite and not eating much. She is taking Ensure TID. She will continue to f/u dietician. -She notes having 3 BM daily with diarrhea. I discussed if she has more than 3 a day, she can use imodium.   4. 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  -Hemoglobin electrophoresisshowed increase in Hg C, consistent with Hg C trait. I discussed the results with her and informed her that her children are at risk ofhemoglobin C trait, and I encouraged him to be tested. -Anemia overall mild and stable.Continue oral ironBID.  5. HTN, OA, Obesity,Anxiety, depression -She will continue to f/u with PCP, Chiropractor and Ortho. -For Anxiety and depression she isunder care of [facility] provider. MoodstableonZoloft   PLAN: -We will reach out to [facility] regarding her referral  -Labs reviewed, adequate for treatment, will proceed weekly dexamethasone 20 mg and Velcade injection today  -Continue Revlimid 25mg  2 weeks on, 1 weekoff.  Currently the off week.  -Proceed with Zometa today, continue every 4 weeks.  -Lab, flush, Dexa and Velcade weekly -F/u in 2 and 5 weeks.    No problem-specific Assessment & Plan notes found for this encounter.   No orders of the defined types were placed in this encounter.  All questions were answered. The patient knows to call the clinic with any problems, questions or concerns. No barriers to learning was detected. The total time spent in the appointment was 30 minutes.     Truitt Merle, MD 09/28/2020   I, Joslyn Devon, am acting as scribe  for Truitt Merle, MD.   I have reviewed the above documentation for accuracy and completeness, and I agree with the above.

## 2020-09-28 ENCOUNTER — Inpatient Hospital Stay: Payer: BC Managed Care – PPO | Admitting: Nutrition

## 2020-09-28 ENCOUNTER — Ambulatory Visit
Admission: RE | Admit: 2020-09-28 | Discharge: 2020-09-28 | Disposition: A | Discharge status: Home / Self Care | Payer: BC Managed Care – PPO | Source: Ambulatory Visit | Attending: Radiation Oncology | Admitting: Radiation Oncology

## 2020-09-28 ENCOUNTER — Other Ambulatory Visit: Payer: Self-pay

## 2020-09-28 ENCOUNTER — Inpatient Hospital Stay: Payer: BC Managed Care – PPO | Attending: Hematology

## 2020-09-28 ENCOUNTER — Inpatient Hospital Stay: Payer: BC Managed Care – PPO

## 2020-09-28 ENCOUNTER — Other Ambulatory Visit: Payer: Self-pay | Admitting: Hematology

## 2020-09-28 ENCOUNTER — Encounter: Payer: Self-pay | Admitting: Hematology

## 2020-09-28 ENCOUNTER — Inpatient Hospital Stay (HOSPITAL_BASED_OUTPATIENT_CLINIC_OR_DEPARTMENT_OTHER): Payer: BC Managed Care – PPO | Admitting: Hematology

## 2020-09-28 VITALS — HR 94

## 2020-09-28 DIAGNOSIS — C9000 Multiple myeloma not having achieved remission: Secondary | ICD-10-CM

## 2020-09-28 DIAGNOSIS — D509 Iron deficiency anemia, unspecified: Secondary | ICD-10-CM | POA: Insufficient documentation

## 2020-09-28 DIAGNOSIS — F419 Anxiety disorder, unspecified: Secondary | ICD-10-CM | POA: Insufficient documentation

## 2020-09-28 DIAGNOSIS — I10 Essential (primary) hypertension: Secondary | ICD-10-CM | POA: Diagnosis not present

## 2020-09-28 DIAGNOSIS — F32A Depression, unspecified: Secondary | ICD-10-CM | POA: Diagnosis not present

## 2020-09-28 DIAGNOSIS — Z5112 Encounter for antineoplastic immunotherapy: Secondary | ICD-10-CM | POA: Insufficient documentation

## 2020-09-28 DIAGNOSIS — M1990 Unspecified osteoarthritis, unspecified site: Secondary | ICD-10-CM | POA: Diagnosis not present

## 2020-09-28 DIAGNOSIS — E669 Obesity, unspecified: Secondary | ICD-10-CM | POA: Diagnosis not present

## 2020-09-28 LAB — CBC WITH DIFFERENTIAL (CANCER CENTER ONLY)
Abs Immature Granulocytes: 0.07 10*3/uL (ref 0.00–0.07)
Basophils Absolute: 0 10*3/uL (ref 0.0–0.1)
Basophils Relative: 0 %
Eosinophils Absolute: 0.4 10*3/uL (ref 0.0–0.5)
Eosinophils Relative: 8 %
HCT: 32.8 % — ABNORMAL LOW (ref 36.0–46.0)
Hemoglobin: 11 g/dL — ABNORMAL LOW (ref 12.0–15.0)
Immature Granulocytes: 2 %
Lymphocytes Relative: 39 %
Lymphs Abs: 1.7 10*3/uL (ref 0.7–4.0)
MCH: 24.9 pg — ABNORMAL LOW (ref 26.0–34.0)
MCHC: 33.5 g/dL (ref 30.0–36.0)
MCV: 74.2 fL — ABNORMAL LOW (ref 80.0–100.0)
Monocytes Absolute: 0.3 10*3/uL (ref 0.1–1.0)
Monocytes Relative: 6 %
Neutro Abs: 2 10*3/uL (ref 1.7–7.7)
Neutrophils Relative %: 45 %
Platelet Count: 133 10*3/uL — ABNORMAL LOW (ref 150–400)
RBC: 4.42 MIL/uL (ref 3.87–5.11)
RDW: 19.3 % — ABNORMAL HIGH (ref 11.5–15.5)
WBC Count: 4.4 10*3/uL (ref 4.0–10.5)
nRBC: 0 % (ref 0.0–0.2)

## 2020-09-28 LAB — CMP (CANCER CENTER ONLY)
ALT: 149 U/L — ABNORMAL HIGH (ref 0–44)
AST: 78 U/L — ABNORMAL HIGH (ref 15–41)
Albumin: 3.5 g/dL (ref 3.5–5.0)
Alkaline Phosphatase: 94 U/L (ref 38–126)
Anion gap: 11 (ref 5–15)
BUN: 9 mg/dL (ref 6–20)
CO2: 31 mmol/L (ref 22–32)
Calcium: 8.6 mg/dL — ABNORMAL LOW (ref 8.9–10.3)
Chloride: 100 mmol/L (ref 98–111)
Creatinine: 0.79 mg/dL (ref 0.44–1.00)
GFR, Estimated: >60 mL/min (ref >60)
Glucose, Bld: 95 mg/dL (ref 70–99)
Potassium: 3.3 mmol/L — ABNORMAL LOW (ref 3.5–5.1)
Sodium: 142 mmol/L (ref 135–145)
Total Bilirubin: 1.2 mg/dL (ref 0.3–1.2)
Total Protein: 6.3 g/dL — ABNORMAL LOW (ref 6.5–8.1)

## 2020-09-28 MED ORDER — SODIUM CHLORIDE 0.9 % IV SOLN
Freq: Once | INTRAVENOUS | Status: AC
  Filled 2020-09-28: qty 250

## 2020-09-28 MED ORDER — DEXAMETHASONE 4 MG PO TABS
ORAL_TABLET | ORAL | Status: AC
  Filled 2020-09-28: qty 5

## 2020-09-28 MED ORDER — LENALIDOMIDE 25 MG PO CAPS: ORAL_CAPSULE | ORAL | 0 refills | Status: DC

## 2020-09-28 MED ORDER — ZOLEDRONIC ACID 4 MG/100ML IV SOLN
4.0000 mg | Freq: Once | INTRAVENOUS | Status: AC
  Administered 2020-09-28: 4 mg via INTRAVENOUS

## 2020-09-28 MED ORDER — ZOLEDRONIC ACID 4 MG/100ML IV SOLN
INTRAVENOUS | Status: AC
  Filled 2020-09-28: qty 100

## 2020-09-28 MED ORDER — BORTEZOMIB CHEMO SQ INJECTION 3.5 MG (2.5MG/ML)
1.3000 mg/m2 | Freq: Once | INTRAMUSCULAR | Status: AC
  Administered 2020-09-28: 2.75 mg via SUBCUTANEOUS
  Filled 2020-09-28: qty 1.1

## 2020-09-28 MED ORDER — DEXAMETHASONE 4 MG PO TABS
20.0000 mg | ORAL_TABLET | Freq: Once | ORAL | Status: AC
  Administered 2020-09-28: 20 mg via ORAL

## 2020-09-28 NOTE — Patient Instructions (Signed)
[facility] ONCOL[facility]  Discharge Instructions: Thank you for choosing [facility] to provide your oncology and hematology care.   If you have a lab appointment with the [facility], please go directly to the [facility] and check in at the registration area.   Wear comfortable clothing and clothing appropriate for easy access to any Portacath or PICC line.   We strive to give you quality time with your provider. You may need to reschedule your appointment if you arrive late (15 or more minutes).  Arriving late affects you and other patients whose appointments are after yours.  Also, if you miss three or more appointments without notifying the office, you may be dismissed from the clinic at the provider's discretion.      For prescription refill requests, have your pharmacy contact our office and allow 72 hours for refills to be completed.    Today you received the following chemotherapy and/or immunotherapy agents velcade and zometa   To help prevent nausea and vomiting after your treatment, we encourage you to take your nausea medication as directed.  BELOW ARE SYMPTOMS THAT SHOULD BE REPORTED IMMEDIATELY: . *FEVER GREATER THAN 100.4 F (38 C) OR HIGHER . *CHILLS OR SWEATING . *NAUSEA AND VOMITING THAT IS NOT CONTROLLED WITH YOUR NAUSEA MEDICATION . *UNUSUAL SHORTNESS OF BREATH . *UNUSUAL BRUISING OR BLEEDING . *URINARY PROBLEMS (pain or burning when urinating, or frequent urination) . *BOWEL PROBLEMS (unusual diarrhea, constipation, pain near the anus) . TENDERNESS IN MOUTH AND THROAT WITH OR WITHOUT PRESENCE OF ULCERS (sore throat, sores in mouth, or a toothache) . UNUSUAL RASH, SWELLING OR PAIN  . UNUSUAL VAGINAL DISCHARGE OR ITCHING   Items with * indicate a potential emergency and should be followed up as soon as possible or go to the Emergency Department if any problems should occur.  Please show the CHEMOTHERAPY ALERT CARD or IMMUNOTHERAPY  ALERT CARD at check-in to the Emergency Department and triage nurse.  Should you have questions after your visit or need to cancel or reschedule your appointment, please contact [facility]  Dept: [phone]  and follow the prompts.  Office hours are 8:00 a.m. to 4:30 p.m. Monday - Friday. Please note that voicemails left after 4:00 p.m. may not be returned until the following business day.  We are closed weekends and major holidays. You have access to a nurse at all times for urgent questions. Please call the main number to the clinic Dept: [phone] and follow the prompts.   For any non-urgent questions, you may also contact your provider using MyChart. We now offer e-Visits for anyone 40 and older to request care online for non-urgent symptoms. For details visit [email or web address].   Also download the MyChart app! Go to the app store, search "MyChart", open the app, select [facility], and log in with your MyChart username and password.  Due to Covid, a mask is required upon entering the hospital/clinic. If you do not have a mask, one will be given to you upon arrival. For doctor visits, patients may have 1 support person aged 32 or older with them. For treatment visits, patients cannot have anyone with them due to current Covid guidelines and our immunocompromised population.

## 2020-09-28 NOTE — Progress Notes (Signed)
Ok to treat with ALT of 146 per Truitt Merle, MD

## 2020-09-28 NOTE — Progress Notes (Signed)
Nutrition follow-up completed with patient during infusion for multiple myeloma.  Weight decreased and documented as 190.9 pounds May 2 decreased from 200 pounds on April 11.  Patient reports she does not enjoy eating because food does not have any taste..  She reports taste alterations were not improved with baking soda and salt water rinses. Comments that foods taste very bland.  She denies other nutrition impact symptoms and reports nausea has much improved.  Nutrition diagnosis: Unintended weight loss continues.  Intervention: Reviewed additional strategies for taste alterations. Encouraged oral nutrition supplements as tolerated.  Recommended high-calorie high-protein supplements.  Monitoring, evaluation, goals: Patient will tolerate increased calories and protein for weight stabilization.  Next visit: To be scheduled with upcoming treatment.  **Disclaimer: This note was dictated with voice recognition software. Similar sounding words can inadvertently be transcribed and this note may contain transcription errors which may not have been corrected upon publication of note.**

## 2020-09-28 NOTE — Progress Notes (Signed)
  Radiation Oncology         [phone] ________________________________  Name: Hannah [name] MRN: [id number]  Date of Service: 09/28/2020  DOB: Dec 23, 1960  Post Treatment Telephone Note  Diagnosis:  Multiple Myeloma not having achieved remission.  Interval Since Last Radiation:  7 weeks   07/30/2020 through 08/12/2020 Site Technique Total Dose (Gy) Dose per Fx (Gy) Completed Fx Beam Energies  Pelvis: Pelvis 3D 30/30 3 10/10 15X  Scapula, Left: Chest_Lt Complex 25/25 2.5 10/10 10X  Spine: Spine_T4-6 Complex 20/20 4 5/5 10X, 15X  Spine: Spine_T10-12 Complex 20/20 4 5/5 15X     Narrative:  The patient was contacted today for routine follow-up. During treatment she did very well with radiotherapy and did not have significant desquamation. She reports she is doing well and pain improved as well.   Impression/Plan: 1. Multiple Myeloma not having achieved remission. The patient has been doing well since completion of radiotherapy. We discussed that we would be happy to continue to follow her as needed, but she will also continue to follow up with Dr. Burr Medico in medical oncology.      Carola Rhine, PAC

## 2020-09-29 ENCOUNTER — Other Ambulatory Visit: Payer: Self-pay | Admitting: *Deleted

## 2020-09-29 ENCOUNTER — Ambulatory Visit: Payer: BC Managed Care – PPO

## 2020-09-29 DIAGNOSIS — C9000 Multiple myeloma not having achieved remission: Secondary | ICD-10-CM

## 2020-09-29 MED ORDER — LENALIDOMIDE 25 MG PO CAPS: ORAL_CAPSULE | ORAL | 0 refills | Status: DC

## 2020-09-30 ENCOUNTER — Telehealth: Payer: Self-pay | Admitting: Hematology

## 2020-09-30 NOTE — Telephone Encounter (Signed)
Scheduled follow-up appointments per 5/2 los. Patient is aware. ?

## 2020-10-01 ENCOUNTER — Ambulatory Visit: Payer: BC Managed Care – PPO | Attending: Orthopaedic Surgery

## 2020-10-05 ENCOUNTER — Inpatient Hospital Stay: Payer: BC Managed Care – PPO

## 2020-10-05 ENCOUNTER — Telehealth: Payer: Self-pay

## 2020-10-05 ENCOUNTER — Other Ambulatory Visit: Payer: Self-pay

## 2020-10-05 ENCOUNTER — Telehealth: Payer: Self-pay | Admitting: *Deleted

## 2020-10-05 VITALS — BP 108/82 | HR 91 | Temp 98.3°F | Resp 18 | Wt 187.8 lb

## 2020-10-05 DIAGNOSIS — M1990 Unspecified osteoarthritis, unspecified site: Secondary | ICD-10-CM | POA: Diagnosis not present

## 2020-10-05 DIAGNOSIS — E669 Obesity, unspecified: Secondary | ICD-10-CM | POA: Diagnosis not present

## 2020-10-05 DIAGNOSIS — C9000 Multiple myeloma not having achieved remission: Secondary | ICD-10-CM

## 2020-10-05 DIAGNOSIS — I10 Essential (primary) hypertension: Secondary | ICD-10-CM | POA: Diagnosis not present

## 2020-10-05 DIAGNOSIS — E876 Hypokalemia: Secondary | ICD-10-CM

## 2020-10-05 DIAGNOSIS — Z5112 Encounter for antineoplastic immunotherapy: Secondary | ICD-10-CM | POA: Diagnosis not present

## 2020-10-05 DIAGNOSIS — D509 Iron deficiency anemia, unspecified: Secondary | ICD-10-CM | POA: Diagnosis not present

## 2020-10-05 DIAGNOSIS — F32A Depression, unspecified: Secondary | ICD-10-CM | POA: Diagnosis not present

## 2020-10-05 DIAGNOSIS — F419 Anxiety disorder, unspecified: Secondary | ICD-10-CM | POA: Diagnosis not present

## 2020-10-05 LAB — CBC WITH DIFFERENTIAL (CANCER CENTER ONLY)
Abs Immature Granulocytes: 0.03 10*3/uL (ref 0.00–0.07)
Basophils Absolute: 0 10*3/uL (ref 0.0–0.1)
Basophils Relative: 0 %
Eosinophils Absolute: 0.1 10*3/uL (ref 0.0–0.5)
Eosinophils Relative: 1 %
HCT: 34.4 % — ABNORMAL LOW (ref 36.0–46.0)
Hemoglobin: 11.4 g/dL — ABNORMAL LOW (ref 12.0–15.0)
Immature Granulocytes: 1 %
Lymphocytes Relative: 26 %
Lymphs Abs: 1.5 10*3/uL (ref 0.7–4.0)
MCH: 24.6 pg — ABNORMAL LOW (ref 26.0–34.0)
MCHC: 33.1 g/dL (ref 30.0–36.0)
MCV: 74.3 fL — ABNORMAL LOW (ref 80.0–100.0)
Monocytes Absolute: 0.7 10*3/uL (ref 0.1–1.0)
Monocytes Relative: 12 %
Neutro Abs: 3.5 10*3/uL (ref 1.7–7.7)
Neutrophils Relative %: 60 %
Platelet Count: 160 10*3/uL (ref 150–400)
RBC: 4.63 MIL/uL (ref 3.87–5.11)
RDW: 19.3 % — ABNORMAL HIGH (ref 11.5–15.5)
WBC Count: 5.8 10*3/uL (ref 4.0–10.5)
nRBC: 0 % (ref 0.0–0.2)

## 2020-10-05 LAB — CMP (CANCER CENTER ONLY)
ALT: 101 U/L — ABNORMAL HIGH (ref 0–44)
AST: 42 U/L — ABNORMAL HIGH (ref 15–41)
Albumin: 3.7 g/dL (ref 3.5–5.0)
Alkaline Phosphatase: 97 U/L (ref 38–126)
Anion gap: 15 (ref 5–15)
BUN: 10 mg/dL (ref 6–20)
CO2: 30 mmol/L (ref 22–32)
Calcium: 8.4 mg/dL — ABNORMAL LOW (ref 8.9–10.3)
Chloride: 95 mmol/L — ABNORMAL LOW (ref 98–111)
Creatinine: 1.01 mg/dL — ABNORMAL HIGH (ref 0.44–1.00)
GFR, Estimated: >60 mL/min (ref >60)
Glucose, Bld: 88 mg/dL (ref 70–99)
Potassium: 2.7 mmol/L — CL (ref 3.5–5.1)
Sodium: 140 mmol/L (ref 135–145)
Total Bilirubin: 1.2 mg/dL (ref 0.3–1.2)
Total Protein: 6.5 g/dL (ref 6.5–8.1)

## 2020-10-05 MED ORDER — POTASSIUM CHLORIDE 10 MEQ/100ML IV SOLN
INTRAVENOUS | Status: AC
  Filled 2020-10-05: qty 200

## 2020-10-05 MED ORDER — POTASSIUM CHLORIDE 10 MEQ/100ML IV SOLN
10.0000 meq | INTRAVENOUS | Status: AC
  Administered 2020-10-05 (×2): 10 meq via INTRAVENOUS

## 2020-10-05 MED ORDER — BORTEZOMIB CHEMO SQ INJECTION 3.5 MG (2.5MG/ML)
1.3000 mg/m2 | Freq: Once | INTRAMUSCULAR | Status: AC
  Administered 2020-10-05: 2.75 mg via SUBCUTANEOUS
  Filled 2020-10-05: qty 1.1

## 2020-10-05 MED ORDER — DEXAMETHASONE 4 MG PO TABS
20.0000 mg | ORAL_TABLET | Freq: Once | ORAL | Status: AC
Start: 2020-10-05 — End: 2020-10-05
  Administered 2020-10-05: 20 mg via ORAL

## 2020-10-05 MED ORDER — DEXAMETHASONE 4 MG PO TABS
ORAL_TABLET | ORAL | Status: AC
  Filled 2020-10-05: qty 5

## 2020-10-05 NOTE — Telephone Encounter (Signed)
Hannah [name] took her potassium oral supplement last week for 2 days.  I instructed her to take 3 tablets daily.  She verbalized understanding.

## 2020-10-05 NOTE — Telephone Encounter (Signed)
[place] forms cover sheet with ROI signed 09/22/2020 received today without a form.   Connected with YASMINE KILBOURNE [phone]) to ask what form was provided.  "No form is needed by [facility] for my disability.  [place] mailed me a release to sign to receive records only.  I dropped form off today for records to be released."  Advised this nurse will send to [place].I.M. for [place] (SW) H.I.M. to release records through 09/22/2020.  "No I come in every week, release brought in today for all records to be released as needed."  Provided (SW) H.I.M. number to discuss release.

## 2020-10-05 NOTE — Telephone Encounter (Signed)
CRITICAL VALUE STICKER  CRITICAL VALUE: Potassium - 2.7  RECEIVER (on-site recipient of call): Patty Sermons, [place] NOTIFIED: 10/05/2020 @ 0936  MESSENGER (representative from lab): Ulice Dash  MD NOTIFIED: Ihor Gully, RN notified  - RN will notify MD who is currently with another patient  TIME OF NOTIFICATION: 10/05/2020 @ 0938  RESPONSE: Desk RN aware and notifying Burr Medico MD

## 2020-10-05 NOTE — Telephone Encounter (Signed)
Critical value:  K 2.7 Dr Burr Medico notified.

## 2020-10-07 NOTE — Progress Notes (Signed)
[facility]   [phone] Fax:(336) [phone]   Clinic Follow up Note   Patient Care Team: Tisovec, Fransico Him, MD as PCP - General (Internal Medicine) Bo Merino, MD as Consulting Physician (Rheumatology)  Date of Service:  10/12/2020  CHIEF COMPLAINT: f/u ofmultiple myeloma  SUMMARY OF ONCOLOGIC HISTORY: Oncology History Overview Note  Cancer Staging Multiple myeloma ([place]) Staging form: Plasma Cell Myeloma and Plasma Cell Disorders, AJCC 8th Edition - Clinical stage from 07/20/2020: Beta-2-microglobulin (mg/L): 2.4, Albumin (g/dL): 3.2, ISS: Stage II, High-risk cytogenetics: Unknown, LDH: Normal - Signed by Truitt Merle, MD on 08/02/2020 Beta 2 microglobulin range (mg/L): Less than 3.5 Albumin range (g/dL): Less than 3.5 Cytogenetics: Unknown    Multiple myeloma ([place])  07/03/2020 Imaging   MRI Lumbar Spine  IMPRESSION: 1. Numerous T1 hypointense and STIR hyperintense lesions throughout the visualized spine and sacrum with multiple areas of extraosseous extension in the sacrum, detailed above and compatible with multiple myeloma given the clinical history. 2. An MRI of the pelvis with contrast could evaluate the full extent of the partially imaged sacral lesions, including left S1-S2 neural foraminal involvement and suspected involvement of the exited left L5 nerve. 3. Multilevel degenerative change without significant canal or foraminal stenosis in the lumbar spine.   07/13/2020 Initial Diagnosis   Multiple myeloma ([place])   07/20/2020 Cancer Staging   Staging form: Plasma Cell Myeloma and Plasma Cell Disorders, AJCC 8th Edition - Clinical stage from 07/20/2020: Beta-2-microglobulin (mg/L): 2.4, Albumin (g/dL): 3.2, ISS: Stage II, High-risk cytogenetics: Unknown, LDH: Normal - Signed by Truitt Merle, MD on 08/31/2020 Beta 2 microglobulin range (mg/L): Less than 3.5 Albumin range (g/dL): Less than 3.5 Cytogenetics: No abnormalities Bone disease on  imaging: Present   07/24/2020 PET scan   IMPRESSION: 1. Moderate to high hypermetabolic activity associated with the expansile soft tissue masses within the LEFT and RIGHT pelvic bones is consistent with active multiple myeloma / plasmacytoma. 2. Lytic lesion in the LEFT scapula with mild metabolic activity is indeterminate. 3. Metabolic activity associated with the RIGHT knee prosthetic and RIGHT distal foot fusion is favored post procedural inflammation.     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  The findings are most consistent with  persistent/recurrent/previously known plasma cell neoplasm.  For  completeness, immunohistochemical stain for CD138 and in situ  hybridization for kappa and lambda will be performed and the results  reported in an addendum.  Correlation with cytogenetic and FISH studies  is recommended.   08/03/2020 -  Chemotherapy   Zometa starting 08/03/20    08/17/2020 -  Chemotherapy   Velcade weekly, Revlimid, dex 51m weekly (VRd)  Starting 08/17/20.        CURRENT THERAPY:  Zometa starting 08/03/20 Velcade, Revlimid, dex(VRd)2weeks on/1 week offstarting 08/17/20.  INTERVAL HISTORY:  Hannah CHRISTIANAis here for a follow up of MM. She was last seen by me 09/28/20. She presents to the clinic alone. She notes she is currently doing well.  She notes last week she had moderate nausea. She was able to recover this past weekend. She  notes her appetite is fair. She is trying to take more Ensure and fruit which she can tolerate. She cannot tolerate the taste of broths/soups. She notes she is overall tolerating chemo. She notes occasional tingling in her fingers, but does not last. She denies any current pain. I reviewed her medication list with her. She has not needed pain medication lately. She plans to be seen at [facility] tomorrow about Bone marrow transplant. She notes she is on week 2 of current Revlimid cycle. She will be off next week.     REVIEW OF SYSTEMS:   Constitutional: Denies fevers, chills or abnormal weight loss Eyes: Denies blurriness of vision Ears, nose, mouth, throat, and face: Denies mucositis or sore throat Respiratory: Denies cough, dyspnea or wheezes Cardiovascular: Denies palpitation, chest discomfort or lower extremity swelling Gastrointestinal:  Denies nausea, heartburn or change in bowel habits Skin: Denies abnormal skin rashes Lymphatics: Denies new lymphadenopathy or easy bruising Neurological:Denies numbness, tingling or new weaknesses Behavioral/Psych: Mood is stable, no new changes  All other systems were reviewed with the patient and are negative.  MEDICAL HISTORY:  Past Medical History:  Diagnosis Date  . Allergy   . Anemia   . Anxiety   . Arthritis   . Asthma   . Depression   . Hypertension     SURGICAL HISTORY: Past Surgical History:  Procedure Laterality Date  . CHOLECYSTECTOMY  1993  . COLONOSCOPY    . 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 TOTAL KNEE ARTHROPLASTY Right 01/28/2016   Procedure: RIGHT TOTAL KNEE ARTHROPLASTY;  Surgeon: [name], MD;  Location: [facility];  Service: Orthopedics;  Laterality: Right;  . TRIGGER FINGER RELEASE Right 06/18/2014   Procedure: RIGHT LONG FINGER TRIGGER RELEASE;  Surgeon: [name], MD;  Location: [facility];  Service: Orthopedics;  Laterality: Right;  . TUBAL LIGATION    . VAGINAL HYSTERECTOMY Bilateral  11/04/2016   Procedure: HYSTERECTOMY VAGINAL with Bilateral Salpingectomy;  Surgeon: [name], MD;  Location: [facility];  Service: Gynecology;  Laterality: Bilateral;  . WEIL OSTEOTOMY Right 07/07/2020   Procedure: RIGHT FOOT WEIL OSTEOTOMY 2, 3, AND 4 METATARSALS AND PROXIMAL INTERPHALANGEAL JOINT FUSION 2 & 4 TOES;  Surgeon: Newt Minion, MD;  Location: [facility];  Service: Orthopedics;  Laterality: Right;    I have reviewed the social history and family history with the patient and they are unchanged from previous note.  ALLERGIES:  is allergic to elemental sulfur.  MEDICATIONS:  Current Outpatient Medications  Medication Sig Dispense Refill  . albuterol (VENTOLIN HFA) 108 (90 Base) MCG/ACT inhaler Inhale into the lungs.    . budesonide-formoterol (SYMBICORT) 160-4.5 MCG/ACT inhaler Inhale 2 puffs into the lungs daily as needed.    . calcium carbonate (OSCAL) 1500 (600 Ca) MG TABS tablet Take 600 mg of elemental calcium by mouth daily.    . Cholecalciferol (VITAMIN D3) 1.25 MG (50000 UT) CAPS Take 1 capsule by mouth once a week.    . ferrous sulfate 325 (65 FE) MG tablet Take 325 mg by mouth 2 (two) times daily with a meal.    . KLOR-CON M20 20 MEQ tablet TAKE 1 TABLET BY MOUTH TWICE A DAY 60 tablet 0  . lenalidomide (REVLIMID) 25 MG capsule Take 1 capsule by mouth for 14 days and then 0 capsules for 7 days 14 capsule 0  .  ondansetron (ZOFRAN) 8 MG tablet Take 1 tablet (8 mg total) by mouth every 8 (eight) hours as needed for nausea or vomiting. 20 tablet 3  . pantoprazole (PROTONIX) 20 MG tablet TAKE 1 TABLET BY MOUTH EVERY DAY 30 tablet 0  . prochlorperazine (COMPAZINE) 10 MG tablet Take 1 tablet (10 mg total) by mouth every 6 (six) hours as needed for nausea or vomiting. 30 tablet 3  . valACYclovir (VALTREX) 500 MG tablet Take 500 mg by mouth 2 (two) times daily.    . Vitamin D, Ergocalciferol, (DRISDOL) 50000 units CAPS capsule Take 50,000 Units by mouth every  Monday.      No current facility-administered medications for this visit.    PHYSICAL EXAMINATION: ECOG PERFORMANCE STATUS: 1 - Symptomatic but completely ambulatory  Vitals:   10/12/20 0835  BP: 111/80  Pulse: (!) 121  Resp: 17  Temp: 98.3 F (36.8 C)  SpO2: 100%   Filed Weights   10/12/20 0835  Weight: 187 lb 9.6 oz (85.1 kg)    GENERAL:alert, no distress and comfortable SKIN: skin color, texture, turgor are normal, no rashes or significant lesions EYES: normal, Conjunctiva are pink and non-injected, sclera clear  NECK: supple, thyroid normal size, non-tender, without nodularity LYMPH:  no palpable lymphadenopathy in the cervical, axillary  LUNGS: clear to auscultation and percussion with normal breathing effort HEART: regular rate & rhythm and no murmurs and no lower extremity edema ABDOMEN:abdomen soft, non-tender and normal bowel sounds Musculoskeletal:no cyanosis of digits and no clubbing  NEURO: alert & oriented x 3 with fluent speech, no focal motor/sensory deficits  LABORATORY DATA:  I have reviewed the data as listed CBC Latest Ref Rng & Units 10/12/2020 10/05/2020 09/28/2020  WBC 4.0 - 10.5 K/uL 5.4 5.8 4.4  Hemoglobin 12.0 - 15.0 g/dL 11.6(L) 11.4(L) 11.0(L)  Hematocrit 36.0 - 46.0 % 34.5(L) 34.4(L) 32.8(L)  Platelets 150 - 400 K/uL 120(L) 160 133(L)     CMP Latest Ref Rng & Units 10/05/2020 09/28/2020 09/21/2020  Glucose 70 - 99 mg/dL 88 95 101(H)  BUN 6 - 20 mg/dL _0 Creatinine 0.44 - 1.00 mg/dL 1.01(H) 0.79 0.91  Sodium 135 - 145 mmol/L 140 142 138  Potassium 3.5 - 5.1 mmol/L 2.7(LL) 3.3(L) 2.5(LL)  Chloride 98 - 111 mmol/L 95(L) 100 93(L)  CO2 22 - 32 mmol/L _1 Calcium 8.9 - 10.3 mg/dL 8.4(L) 8.6(L) 8.8(L)  Total Protein 6.5 - 8.1 g/dL 6.5 6.3(L) 7.1  Total Bilirubin 0.3 - 1.2 mg/dL 1.2 1.2 1.0  Alkaline Phos 38 - 126 U/L 97 94 94  AST 15 - 41 U/L 42(H) 78(H) 52(H)  ALT 0 - 44 U/L 101(H) 149(H) 103(H)      RADIOGRAPHIC STUDIES: I have  personally reviewed the radiological images as listed and agreed with the findings in the report. No results found.   ASSESSMENT & PLAN:  Hannah [name] is a 60 y.o. 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 I previouslydiscussed with pathologist Dr. Gari Crown, the increased plasma cells in bone marrow is likely between 10 to 20%, and would favorsmolderingmultiple myeloma than MGUS. She initially did not meet CRAB Criteria for MM.  -After increase of IgA, light chain and M-protein (3.3) on 06/01/20 labs,multiple T1 lesions in her lumbarspine and sacrumon 07/03/20 MRI, hypermetabolic  uptake inmultiplebone lesion on 07/24/20 PET,she meets thediagnostic criteria for multiple myeloma -She underwent a bone marrow biopsy on 07/30/20, which showed 31%plasmacells.Cytology and FISH was negative gene mutations,which indicating standard risk disease. -Shehas completedpalliative radiation to sacral bone lesions in 07/2020.  -She has started inductionchemotherapy on 08/17/20 withVRd (Velcade, Revlimid2 weeks on, and 1 week off and dexamethasone). She is acandidate for bone marrow transplant. -Her 09/14/20 MM panel showed her M-protein decreased to 1.2, IgA and light chains have significantly decreased, indicating excellent response to treatment.  -She is tolerating treatment well except fatigue, taste change and nausea which are likely from Revlimid. The tingling in her fingers is only intermittent..  We discussed the option of changing Revlimid to Pomalidomide, she prefers to continue Revlimid for now. -Labs reviewed, Hg 11.6, plt 120K. MM panel still pending today. Overall adequate to proceed with Velcade today. Continue with Revlimid. She is currently on week 2 of current Revlimid  cycle. She will be off next week.  -She will proceed with consult for bone marrow consult at [facility] tomorrow. I reviewed this process with her today, followed by maintenance Revlimid.  -f/u in 2 weeks. -I discussed option of Evusheld COVID treatment to prevent COVID. She will think about it.    2. Low back pain, right hip pain,Mid right back pain,secondary to MM -During work up with orthopedic surgeon, she was found to have multiple T1 lesions of her spine and sacrum compatible with MM on her 07/03/20 MRI lumbar spine.These were hypermetabolic on 7/84/69 PET. -For pain management she ison Norcoas needed. Stable and controlled.  Nunzio Cory completed her course of palliative radiation in 07/2020. She has not had pain and has not required pain medication lately.  -She continues with physical therapy, as tolerated. Best to be done on her off week.  -She started Zometa on 08/03/20 to help strengthen her bones.We will continue monthly. She will continue calcium and vitamin D supplement    3. Nausea, Diarrhea, Low appetite, hypokalemia  -Nausea is controlled with antiemetics q8hours. Will continue.  -On oral potassium TID -She notes due to taste change she has low appetite and not eating much. She is taking Ensure TID and tries to eat more of tolerable foods. She will continue to f/u dietician. -She notes having 3 BM daily with diarrhea. I discussed if she has more than 3 a day, she can use imodium.  -Stable.   4. 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  -Hemoglobin electrophoresisshowed increase in Hg C, consistent with Hg C trait. I discussed the results with her and informed her that her children are at risk ofhemoglobin C trait, and I encouraged him to be tested. -Anemia overall mild and stable.Continue oral ironBID.  5. HTN, OA, Obesity,Anxiety, depression, Hypokalemia  -She will continue to f/u with  PCP, Chiropractor and Ortho. -For Anxiety and depression she isunder care of [facility] provider. MoodstableonZoloft -We have held 1 of her HTN medications due to low potassium. She will continue oral Potassium 3 tabs daily. She is fine to crush pill if needed.    PLAN: -Labsreviewed, will proceed with weekly dexamethasone 20 mg and Velcade injection today  -ContinueRevlimid 25m2 weeks on, 1 weekoff.Currently on week 2 of current cycle.  -Zometa in 2 weeks, continue every 4 weeks.  -Lab, flush, Dexa and Velcade weekly -F/u in 2 weeks. She will bring her 24 hour urine by then.  [facility]  No problem-specific Assessment & Plan notes found for this encounter.   No orders of the defined types were placed in this encounter.  All questions were answered. The patient knows to call the clinic with any problems, questions or concerns. No barriers to learning was detected. The total time spent in the appointment was 30 minutes.     Truitt Merle, MD 10/12/2020   I, Joslyn Devon, am acting as scribe for Truitt Merle, MD.   I have reviewed the above documentation for accuracy and completeness, and I agree with the above.

## 2020-10-12 ENCOUNTER — Inpatient Hospital Stay (HOSPITAL_BASED_OUTPATIENT_CLINIC_OR_DEPARTMENT_OTHER): Payer: BC Managed Care – PPO | Admitting: Hematology

## 2020-10-12 ENCOUNTER — Inpatient Hospital Stay: Payer: BC Managed Care – PPO

## 2020-10-12 ENCOUNTER — Other Ambulatory Visit: Payer: Self-pay

## 2020-10-12 VITALS — BP 111/80 | HR 121 | Temp 98.3°F | Resp 17 | Wt 187.6 lb

## 2020-10-12 DIAGNOSIS — C9000 Multiple myeloma not having achieved remission: Secondary | ICD-10-CM

## 2020-10-12 DIAGNOSIS — Z5112 Encounter for antineoplastic immunotherapy: Secondary | ICD-10-CM | POA: Diagnosis not present

## 2020-10-12 DIAGNOSIS — D509 Iron deficiency anemia, unspecified: Secondary | ICD-10-CM | POA: Diagnosis not present

## 2020-10-12 DIAGNOSIS — F32A Depression, unspecified: Secondary | ICD-10-CM | POA: Diagnosis not present

## 2020-10-12 DIAGNOSIS — I10 Essential (primary) hypertension: Secondary | ICD-10-CM | POA: Diagnosis not present

## 2020-10-12 DIAGNOSIS — E669 Obesity, unspecified: Secondary | ICD-10-CM | POA: Diagnosis not present

## 2020-10-12 DIAGNOSIS — M1990 Unspecified osteoarthritis, unspecified site: Secondary | ICD-10-CM | POA: Diagnosis not present

## 2020-10-12 DIAGNOSIS — F419 Anxiety disorder, unspecified: Secondary | ICD-10-CM | POA: Diagnosis not present

## 2020-10-12 LAB — CBC WITH DIFFERENTIAL (CANCER CENTER ONLY)
Abs Immature Granulocytes: 0.18 10*3/uL — ABNORMAL HIGH (ref 0.00–0.07)
Basophils Absolute: 0 10*3/uL (ref 0.0–0.1)
Basophils Relative: 0 %
Eosinophils Absolute: 0.2 10*3/uL (ref 0.0–0.5)
Eosinophils Relative: 3 %
HCT: 34.5 % — ABNORMAL LOW (ref 36.0–46.0)
Hemoglobin: 11.6 g/dL — ABNORMAL LOW (ref 12.0–15.0)
Immature Granulocytes: 3 %
Lymphocytes Relative: 29 %
Lymphs Abs: 1.5 10*3/uL (ref 0.7–4.0)
MCH: 25.1 pg — ABNORMAL LOW (ref 26.0–34.0)
MCHC: 33.6 g/dL (ref 30.0–36.0)
MCV: 74.7 fL — ABNORMAL LOW (ref 80.0–100.0)
Monocytes Absolute: 0.2 10*3/uL (ref 0.1–1.0)
Monocytes Relative: 3 %
Neutro Abs: 3.3 10*3/uL (ref 1.7–7.7)
Neutrophils Relative %: 62 %
Platelet Count: 120 10*3/uL — ABNORMAL LOW (ref 150–400)
RBC: 4.62 MIL/uL (ref 3.87–5.11)
RDW: 19.1 % — ABNORMAL HIGH (ref 11.5–15.5)
WBC Count: 5.4 10*3/uL (ref 4.0–10.5)
nRBC: 0 % (ref 0.0–0.2)

## 2020-10-12 LAB — CMP (CANCER CENTER ONLY)
ALT: 86 U/L — ABNORMAL HIGH (ref 0–44)
AST: 47 U/L — ABNORMAL HIGH (ref 15–41)
Albumin: 3.8 g/dL (ref 3.5–5.0)
Alkaline Phosphatase: 90 U/L (ref 38–126)
Anion gap: 10 (ref 5–15)
BUN: 8 mg/dL (ref 6–20)
CO2: 30 mmol/L (ref 22–32)
Calcium: 8.5 mg/dL — ABNORMAL LOW (ref 8.9–10.3)
Chloride: 99 mmol/L (ref 98–111)
Creatinine: 0.77 mg/dL (ref 0.44–1.00)
GFR, Estimated: >60 mL/min (ref >60)
Glucose, Bld: 90 mg/dL (ref 70–99)
Potassium: 3.3 mmol/L — ABNORMAL LOW (ref 3.5–5.1)
Sodium: 139 mmol/L (ref 135–145)
Total Bilirubin: 1.1 mg/dL (ref 0.3–1.2)
Total Protein: 6.5 g/dL (ref 6.5–8.1)

## 2020-10-12 MED ORDER — DEXAMETHASONE 4 MG PO TABS
ORAL_TABLET | ORAL | Status: AC
  Filled 2020-10-12: qty 5

## 2020-10-12 MED ORDER — BORTEZOMIB CHEMO SQ INJECTION 3.5 MG (2.5MG/ML)
1.3000 mg/m2 | Freq: Once | INTRAMUSCULAR | Status: AC
  Administered 2020-10-12: 2.75 mg via SUBCUTANEOUS
  Filled 2020-10-12: qty 1.1

## 2020-10-12 MED ORDER — DEXAMETHASONE 4 MG PO TABS
20.0000 mg | ORAL_TABLET | Freq: Once | ORAL | Status: AC
  Administered 2020-10-12: 20 mg via ORAL

## 2020-10-12 NOTE — Progress Notes (Unsigned)
Demographics, insurance information, ov notes, labs, imaging reports, and treatment report faxed to [id number] at [phone].  Also requested scan images be pushed to power share.

## 2020-10-12 NOTE — Progress Notes (Signed)
Per Dr. Burr Medico OK to treat with HR and today's labs

## 2020-10-12 NOTE — Patient Instructions (Signed)
[facility]  Discharge Instructions: Thank you for choosing [facility] to provide your oncology and hematology care.   If you have a lab appointment with the [facility], please go directly to the [facility] and check in at the registration area.   Wear comfortable clothing and clothing appropriate for easy access to any Portacath or PICC line.   We strive to give you quality time with your provider. You may need to reschedule your appointment if you arrive late (15 or more minutes).  Arriving late affects you and other patients whose appointments are after yours.  Also, if you miss three or more appointments without notifying the office, you may be dismissed from the clinic at the provider's discretion.      For prescription refill requests, have your pharmacy contact our office and allow 72 hours for refills to be completed.    Today you received the following chemotherapy and/or immunotherapy agents velcade      To help prevent nausea and vomiting after your treatment, we encourage you to take your nausea medication as directed.  BELOW ARE SYMPTOMS THAT SHOULD BE REPORTED IMMEDIATELY: . *FEVER GREATER THAN 100.4 F (38 C) OR HIGHER . *CHILLS OR SWEATING . *NAUSEA AND VOMITING THAT IS NOT CONTROLLED WITH YOUR NAUSEA MEDICATION . *UNUSUAL SHORTNESS OF BREATH . *UNUSUAL BRUISING OR BLEEDING . *URINARY PROBLEMS (pain or burning when urinating, or frequent urination) . *BOWEL PROBLEMS (unusual diarrhea, constipation, pain near the anus) . TENDERNESS IN MOUTH AND THROAT WITH OR WITHOUT PRESENCE OF ULCERS (sore throat, sores in mouth, or a toothache) . UNUSUAL RASH, SWELLING OR PAIN  . UNUSUAL VAGINAL DISCHARGE OR ITCHING   Items with * indicate a potential emergency and should be followed up as soon as possible or go to the Emergency Department if any problems should occur.  Please show the CHEMOTHERAPY ALERT CARD or IMMUNOTHERAPY ALERT  CARD at check-in to the Emergency Department and triage nurse.  Should you have questions after your visit or need to cancel or reschedule your appointment, please contact [facility]  Dept: [phone]  and follow the prompts.  Office hours are 8:00 a.m. to 4:30 p.m. Monday - Friday. Please note that voicemails left after 4:00 p.m. may not be returned until the following business day.  We are closed weekends and major holidays. You have access to a nurse at all times for urgent questions. Please call the main number to the clinic Dept: [phone] and follow the prompts.   For any non-urgent questions, you may also contact your provider using MyChart. We now offer e-Visits for anyone 21 and older to request care online for non-urgent symptoms. For details visit [email or web address].   Also download the MyChart app! Go to the app store, search "MyChart", open the app, select [facility], and log in with your MyChart username and password.  Due to Covid, a mask is required upon entering the hospital/clinic. If you do not have a mask, one will be given to you upon arrival. For doctor visits, patients may have 1 support person aged 16 or older with them. For treatment visits, patients cannot have anyone with them due to current Covid guidelines and our immunocompromised population.  Bortezomib injection What is this medicine? BORTEZOMIB (bor TEZ oh mib) targets proteins in cancer cells and stops the cancer cells from growing. It treats multiple myeloma and mantle cell lymphoma. This medicine may be used for other purposes; ask  your health care provider or pharmacist if you have questions. COMMON BRAND NAME(S): Velcade What should I tell my health care provider before I take this medicine? They need to know if you have any of these conditions:  dehydration  diabetes (high blood sugar)  heart disease  liver disease  tingling of the fingers or toes or other  nerve disorder  an unusual or allergic reaction to bortezomib, mannitol, boron, other medicines, foods, dyes, or preservatives  pregnant or trying to get pregnant  breast-feeding How should I use this medicine? This medicine is injected into a vein or under the skin. It is given by a health care provider in a hospital or clinic setting. Talk to your health care provider about the use of this medicine in children. Special care may be needed. Overdosage: If you think you have taken too much of this medicine contact a poison control center or emergency room at once. NOTE: This medicine is only for you. Do not share this medicine with others. What if I miss a dose? Keep appointments for follow-up doses. It is important not to miss your dose. Call your health care provider if you are unable to keep an appointment. What may interact with this medicine? This medicine may interact with the following medications:  ketoconazole  rifampin This list may not describe all possible interactions. Give your health care provider a list of all the medicines, herbs, non-prescription drugs, or dietary supplements you use. Also tell them if you smoke, drink alcohol, or use illegal drugs. Some items may interact with your medicine. What should I watch for while using this medicine? Your condition will be monitored carefully while you are receiving this medicine. You may need blood work done while you are taking this medicine. You may get drowsy or dizzy. Do not drive, use machinery, or do anything that needs mental alertness until you know how this medicine affects you. Do not stand up or sit up quickly, especially if you are an older patient. This reduces the risk of dizzy or fainting spells This medicine may increase your risk of getting an infection. Call your health care provider for advice if you get a fever, chills, sore throat, or other symptoms of a cold or flu. Do not treat yourself. Try to avoid being  around people who are sick. Check with your health care provider if you have severe diarrhea, nausea, and vomiting, or if you sweat a lot. The loss of too much body fluid may make it dangerous for you to take this medicine. Do not become pregnant while taking this medicine or for 7 months after stopping it. Women should inform their health care provider if they wish to become pregnant or think they might be pregnant. Men should not father a child while taking this medicine and for 4 months after stopping it. There is a potential for serious harm to an unborn child. Talk to your health care provider for more information. Do not breast-feed an infant while taking this medicine or for 2 months after stopping it. This medicine may make it more difficult to get pregnant or father a child. Talk to your health care provider if you are concerned about your fertility. What side effects may I notice from receiving this medicine? Side effects that you should report to your doctor or health care professional as soon as possible:  allergic reactions (skin rash; itching or hives; swelling of the face, lips, or tongue)  bleeding (bloody or black, tarry stools;  red or dark brown urine; spitting up blood or brown material that looks like coffee grounds; red spots on the skin; unusual bruising or bleeding from the eye, gums, or nose)  blurred vision or changes in vision  confusion  constipation  headache  heart failure (trouble breathing; fast, irregular heartbeat; sudden weight gain; swelling of the ankles, feet, hands)  infection (fever, chills, cough, sore throat, pain or trouble passing urine)  lack or loss of appetite  liver injury (dark yellow or brown urine; general ill feeling or flu-like symptoms; loss of appetite, right upper belly pain; yellowing of the eyes or skin)  low blood pressure (dizziness; feeling faint or lightheaded, falls; unusually weak or tired)  muscle cramps  pain, redness, or  irritation at site where injected  pain, tingling, numbness in the hands or feet  seizures  trouble breathing  unusual bruising or bleeding Side effects that usually do not require medical attention (report to your doctor or health care professional if they continue or are bothersome):  diarrhea  nausea  stomach pain  trouble sleeping  vomiting This list may not describe all possible side effects. Call your doctor for medical advice about side effects. You may report side effects to FDA at 1-800-FDA-1088. Where should I keep my medicine? This medicine is given in a hospital or clinic. It will not be stored at home. NOTE: This sheet is a summary. It may not cover all possible information. If you have questions about this medicine, talk to your doctor, pharmacist, or health care provider.  2021 Elsevier/Gold Standard (2020-05-07 13:22:53)

## 2020-10-13 ENCOUNTER — Encounter: Payer: Self-pay | Admitting: Hematology

## 2020-10-13 DIAGNOSIS — T451X5A Adverse effect of antineoplastic and immunosuppressive drugs, initial encounter: Secondary | ICD-10-CM | POA: Diagnosis not present

## 2020-10-13 DIAGNOSIS — Z01818 Encounter for other preprocedural examination: Secondary | ICD-10-CM | POA: Diagnosis not present

## 2020-10-13 DIAGNOSIS — R112 Nausea with vomiting, unspecified: Secondary | ICD-10-CM | POA: Diagnosis not present

## 2020-10-13 DIAGNOSIS — N879 Dysplasia of cervix uteri, unspecified: Secondary | ICD-10-CM | POA: Diagnosis not present

## 2020-10-13 DIAGNOSIS — C9000 Multiple myeloma not having achieved remission: Secondary | ICD-10-CM | POA: Diagnosis not present

## 2020-10-13 LAB — KAPPA/LAMBDA LIGHT CHAINS
Kappa free light chain: 11.1 mg/L (ref 3.3–19.4)
Kappa, lambda light chain ratio: 1.68 — ABNORMAL HIGH (ref 0.26–1.65)
Lambda free light chains: 6.6 mg/L (ref 5.7–26.3)

## 2020-10-14 ENCOUNTER — Other Ambulatory Visit: Payer: Self-pay | Admitting: Nurse Practitioner

## 2020-10-14 DIAGNOSIS — C9000 Multiple myeloma not having achieved remission: Secondary | ICD-10-CM

## 2020-10-15 ENCOUNTER — Other Ambulatory Visit: Payer: Self-pay

## 2020-10-15 ENCOUNTER — Other Ambulatory Visit: Payer: Self-pay | Admitting: Hematology

## 2020-10-15 DIAGNOSIS — C9000 Multiple myeloma not having achieved remission: Secondary | ICD-10-CM

## 2020-10-15 MED ORDER — LENALIDOMIDE 25 MG PO CAPS: ORAL_CAPSULE | ORAL | 0 refills | Status: DC

## 2020-10-16 ENCOUNTER — Other Ambulatory Visit: Payer: Self-pay | Admitting: Nurse Practitioner

## 2020-10-16 DIAGNOSIS — C9000 Multiple myeloma not having achieved remission: Secondary | ICD-10-CM

## 2020-10-16 LAB — MULTIPLE MYELOMA PANEL, SERUM
Albumin SerPl Elph-Mcnc: 3.5 g/dL (ref 2.9–4.4)
Albumin/Glob SerPl: 1.6 (ref 0.7–1.7)
Alpha 1: 0.3 g/dL (ref 0.0–0.4)
Alpha2 Glob SerPl Elph-Mcnc: 0.6 g/dL (ref 0.4–1.0)
B-Globulin SerPl Elph-Mcnc: 1.1 g/dL (ref 0.7–1.3)
Gamma Glob SerPl Elph-Mcnc: 0.3 g/dL — ABNORMAL LOW (ref 0.4–1.8)
Globulin, Total: 2.3 g/dL (ref 2.2–3.9)
IgA: 236 mg/dL (ref 87–352)
IgG (Immunoglobin G), Serum: 571 mg/dL — ABNORMAL LOW (ref 586–1602)
IgM (Immunoglobulin M), Srm: 13 mg/dL — ABNORMAL LOW (ref 26–217)
M Protein SerPl Elph-Mcnc: 0.3 g/dL — ABNORMAL HIGH
Total Protein ELP: 5.8 g/dL — ABNORMAL LOW (ref 6.0–8.5)

## 2020-10-19 ENCOUNTER — Inpatient Hospital Stay: Payer: BC Managed Care – PPO

## 2020-10-19 ENCOUNTER — Other Ambulatory Visit: Payer: Self-pay

## 2020-10-19 VITALS — BP 102/71 | HR 85 | Temp 98.4°F | Resp 16 | Wt 183.5 lb

## 2020-10-19 DIAGNOSIS — C9000 Multiple myeloma not having achieved remission: Secondary | ICD-10-CM | POA: Diagnosis not present

## 2020-10-19 DIAGNOSIS — D509 Iron deficiency anemia, unspecified: Secondary | ICD-10-CM | POA: Diagnosis not present

## 2020-10-19 DIAGNOSIS — E669 Obesity, unspecified: Secondary | ICD-10-CM | POA: Diagnosis not present

## 2020-10-19 DIAGNOSIS — F419 Anxiety disorder, unspecified: Secondary | ICD-10-CM | POA: Diagnosis not present

## 2020-10-19 DIAGNOSIS — Z5112 Encounter for antineoplastic immunotherapy: Secondary | ICD-10-CM | POA: Diagnosis not present

## 2020-10-19 DIAGNOSIS — M1990 Unspecified osteoarthritis, unspecified site: Secondary | ICD-10-CM | POA: Diagnosis not present

## 2020-10-19 DIAGNOSIS — F32A Depression, unspecified: Secondary | ICD-10-CM | POA: Diagnosis not present

## 2020-10-19 DIAGNOSIS — I10 Essential (primary) hypertension: Secondary | ICD-10-CM | POA: Diagnosis not present

## 2020-10-19 LAB — CBC WITH DIFFERENTIAL (CANCER CENTER ONLY)
Abs Immature Granulocytes: 0.06 10*3/uL (ref 0.00–0.07)
Basophils Absolute: 0 10*3/uL (ref 0.0–0.1)
Basophils Relative: 0 %
Eosinophils Absolute: 0.2 10*3/uL (ref 0.0–0.5)
Eosinophils Relative: 5 %
HCT: 31.6 % — ABNORMAL LOW (ref 36.0–46.0)
Hemoglobin: 10.6 g/dL — ABNORMAL LOW (ref 12.0–15.0)
Immature Granulocytes: 1 %
Lymphocytes Relative: 28 %
Lymphs Abs: 1.2 10*3/uL (ref 0.7–4.0)
MCH: 24.7 pg — ABNORMAL LOW (ref 26.0–34.0)
MCHC: 33.5 g/dL (ref 30.0–36.0)
MCV: 73.5 fL — ABNORMAL LOW (ref 80.0–100.0)
Monocytes Absolute: 0.4 10*3/uL (ref 0.1–1.0)
Monocytes Relative: 8 %
Neutro Abs: 2.6 10*3/uL (ref 1.7–7.7)
Neutrophils Relative %: 58 %
Platelet Count: 115 10*3/uL — ABNORMAL LOW (ref 150–400)
RBC: 4.3 MIL/uL (ref 3.87–5.11)
RDW: 19 % — ABNORMAL HIGH (ref 11.5–15.5)
WBC Count: 4.5 10*3/uL (ref 4.0–10.5)
nRBC: 0 % (ref 0.0–0.2)

## 2020-10-19 LAB — CMP (CANCER CENTER ONLY)
ALT: 83 U/L — ABNORMAL HIGH (ref 0–44)
AST: 37 U/L (ref 15–41)
Albumin: 3.5 g/dL (ref 3.5–5.0)
Alkaline Phosphatase: 79 U/L (ref 38–126)
Anion gap: 10 (ref 5–15)
BUN: 11 mg/dL (ref 6–20)
CO2: 30 mmol/L (ref 22–32)
Calcium: 9 mg/dL (ref 8.9–10.3)
Chloride: 100 mmol/L (ref 98–111)
Creatinine: 0.7 mg/dL (ref 0.44–1.00)
GFR, Estimated: >60 mL/min (ref >60)
Glucose, Bld: 84 mg/dL (ref 70–99)
Potassium: 3.6 mmol/L (ref 3.5–5.1)
Sodium: 140 mmol/L (ref 135–145)
Total Bilirubin: 1.4 mg/dL — ABNORMAL HIGH (ref 0.3–1.2)
Total Protein: 6 g/dL — ABNORMAL LOW (ref 6.5–8.1)

## 2020-10-19 MED ORDER — BORTEZOMIB CHEMO SQ INJECTION 3.5 MG (2.5MG/ML)
1.3000 mg/m2 | Freq: Once | INTRAMUSCULAR | Status: AC
  Administered 2020-10-19: 2.75 mg via SUBCUTANEOUS
  Filled 2020-10-19: qty 1.1

## 2020-10-19 MED ORDER — DEXAMETHASONE 4 MG PO TABS
20.0000 mg | ORAL_TABLET | Freq: Once | ORAL | Status: AC
  Administered 2020-10-19: 20 mg via ORAL

## 2020-10-19 MED ORDER — DEXAMETHASONE 4 MG PO TABS
ORAL_TABLET | ORAL | Status: AC
  Filled 2020-10-19: qty 5

## 2020-10-19 NOTE — Progress Notes (Signed)
D/C at 10:15 am

## 2020-10-19 NOTE — Patient Instructions (Signed)
[facility]ICAL [facility]COLOG[facility]  Discharge Instructions: Thank you for choosing [facility] to provide your oncology and hematology care.   If you have a lab appointment with the Cancer Center, please go directly to the Cancer Center and check in at the registration area.   Wear comfortable clothing and clothing appropriate for easy access to any Portacath or PICC line.   We strive to give you quality time with your provider. You may need to reschedule your appointment if you arrive late (15 or more minutes).  Arriving late affects you and other patients whose appointments are after yours.  Also, if you miss three or more appointments without notifying the office, you may be dismissed from the clinic at the provider's discretion.      For prescription refill requests, have your pharmacy contact our office and allow 72 hours for refills to be completed.    Today you received the following chemotherapy and/or immunotherapy agents Velcade       To help prevent nausea and vomiting after your treatment, we encourage you to take your nausea medication as directed.  BELOW ARE SYMPTOMS THAT SHOULD BE REPORTED IMMEDIATELY: *FEVER GREATER THAN 100.4 F (38 C) OR HIGHER *CHILLS OR SWEATING *NAUSEA AND VOMITING THAT IS NOT CONTROLLED WITH YOUR NAUSEA MEDICATION *UNUSUAL SHORTNESS OF BREATH *UNUSUAL BRUISING OR BLEEDING *URINARY PROBLEMS (pain or burning when urinating, or frequent urination) *BOWEL PROBLEMS (unusual diarrhea, constipation, pain near the anus) TENDERNESS IN MOUTH AND THROAT WITH OR WITHOUT PRESENCE OF ULCERS (sore throat, sores in mouth, or a toothache) UNUSUAL RASH, SWELLING OR PAIN  UNUSUAL VAGINAL DISCHARGE OR ITCHING   Items with * indicate a potential emergency and should be followed up as soon as possible or go to the Emergency Department if any problems should occur.  Please show the CHEMOTHERAPY ALERT CARD or IMMUNOTHERAPY ALERT CARD at check-in to  the Emergency Department and triage nurse.  Should you have questions after your visit or need to cancel or reschedule your appointment, please contact [facility]  Dept: [phone]  and follow the prompts.  Office hours are 8:00 a.m. to 4:30 p.m. Monday - Friday. Please note that voicemails left after 4:00 p.m. may not be returned until the following business day.  We are closed weekends and major holidays. You have access to a nurse at all times for urgent questions. Please call the main number to the clinic Dept: [phone] and follow the prompts.   For any non-urgent questions, you may also contact your provider using MyChart. We now offer e-Visits for anyone 18 and older to request care online for non-urgent symptoms. For details visit [email or web address].   Also download the MyChart app! Go to the app store, search "MyChart", open the app, select [facility], and log in with your MyChart username and password.  Due to Covid, a mask is required upon entering the hospital/clinic. If you do not have a mask, one will be given to you upon arrival. For doctor visits, patients may have 1 support person aged 18 or older with them. For treatment visits, patients cannot have anyone with them due to current Covid guidelines and our immunocompromised population.   

## 2020-10-19 NOTE — Progress Notes (Signed)
OK to treat despite elevated liver enzymes per Dr Burr Medico.

## 2020-10-20 LAB — UPEP/UIFE/LIGHT CHAINS/TP, 24-HR UR
% BETA, Urine: 26.2 %
ALPHA 1 URINE: 5 %
Albumin, U: 48.5 %
Alpha 2, Urine: 12.5 %
Free Kappa Lt Chains,Ur: 27.05 mg/L (ref 1.17–86.46)
Free Kappa/Lambda Ratio: 3.37 (ref 1.83–14.26)
Free Lambda Lt Chains,Ur: 8.02 mg/L (ref 0.27–15.21)
GAMMA GLOBULIN URINE: 7.8 %
Total Protein, Urine-Ur/day: 135 mg/24 hr (ref 30–150)
Total Protein, Urine: 19.3 mg/dL
Total Volume: 700

## 2020-10-22 NOTE — Progress Notes (Signed)
Hannah [name]   [phone] [phone]   Clinic Follow up Note   Patient Care Team: [name], Hannah Him, MD as PCP - General (Internal Medicine) Hannah Merino, MD as Consulting Physician (Rheumatology) 10/27/2020  CHIEF COMPLAINT: Follow-up multiple myeloma  SUMMARY OF ONCOLOGIC HISTORY: Oncology History Overview Note  Cancer Staging Multiple myeloma ([place]) Staging form: Plasma Cell Myeloma and Plasma Cell Disorders, AJCC 8th Edition - Clinical stage from 07/20/2020: Beta-2-microglobulin (mg/L): 2.4, Albumin (g/dL): 3.2, ISS: Stage II, High-risk cytogenetics: Unknown, LDH: Normal - Signed by Hannah Merle, MD on 08/02/2020 Beta 2 microglobulin range (mg/L): Less than 3.5 Albumin range (g/dL): Less than 3.5 Cytogenetics: Unknown    Multiple myeloma ([place])  07/03/2020 Imaging   MRI Lumbar Spine  IMPRESSION: 1. Numerous T1 hypointense and STIR hyperintense lesions throughout the visualized spine and sacrum with multiple areas of extraosseous extension in the sacrum, detailed above and compatible with multiple myeloma given the clinical history. 2. An MRI of the pelvis with contrast could evaluate the full extent of the partially imaged sacral lesions, including left S1-S2 neural foraminal involvement and suspected involvement of the exited left L5 nerve. 3. Multilevel degenerative change without significant canal or foraminal stenosis in the lumbar spine.   07/13/2020 Initial Diagnosis   Multiple myeloma ([place])   07/20/2020 Cancer Staging   Staging form: Plasma Cell Myeloma and Plasma Cell Disorders, AJCC 8th Edition - Clinical stage from 07/20/2020: Beta-2-microglobulin (mg/L): 2.4, Albumin (g/dL): 3.2, ISS: Stage II, High-risk cytogenetics: Unknown, LDH: Normal - Signed by Hannah Merle, MD on 08/31/2020 Beta 2 microglobulin range (mg/L): Less than 3.5 Albumin range (g/dL): Less than 3.5 Cytogenetics: No abnormalities Bone disease on imaging: Present    07/24/2020 PET scan   IMPRESSION: 1. Moderate to high hypermetabolic activity associated with the expansile soft tissue masses within the LEFT and RIGHT pelvic bones is consistent with active multiple myeloma / plasmacytoma. 2. Lytic lesion in the LEFT scapula with mild metabolic activity is indeterminate. 3. Metabolic activity associated with the RIGHT knee prosthetic and RIGHT distal foot fusion is favored post procedural inflammation.     07/30/2020 - 08/12/2020 Radiation Therapy   Palliative Radiation to Pelvic lesions with Dr Hannah [name]    07/30/2020 Pathology Results   DIAGNOSIS:   BONE MARROW, ASPIRATE, CLOT, CORE:  -Normocellular to slightly hypercellular bone marrow for age with  plasmacytosis  -See comment   PERIPHERAL BLOOD:  -Microcytic-normochromic anemia   COMMENT:   The bone marrow shows increased number of atypical plasma cells  representing 31% of all cells in the aspirate associated with prominent  interstitial infiltrates and numerous variably sized clusters in the  clot and biopsy sections.  The findings are most consistent with  persistent/recurrent/previously known plasma cell neoplasm.  For  completeness, immunohistochemical stain for CD138 and in situ  hybridization for kappa and lambda will be performed and the results  reported in an addendum.  Correlation with cytogenetic and FISH studies  is recommended.   08/03/2020 -  Chemotherapy   Zometa starting 08/03/20    08/17/2020 -  Chemotherapy   Velcade weekly, Revlimid, dex 94m weekly (VRd)  Starting 08/17/20.       CURRENT THERAPY:  Zometa starting 08/03/20 Velcade, Revlimid, dex(VRd)2weeks on/1 week offstarting 08/17/20.  INTERVAL HISTORY: [name] for follow-up and treatment as scheduled.  She was last seen by Dr. FBurr Medicoon 10/12/2020 and continues weekly velcade. [facility] with plans to move  forward with autotransplant process soon. She started a  new Revlimid cycle on 10/26/20.  She is doing well overall.  Appetite is present but she has no taste, has to force herself to eat and drink.  Incorporating Ensure, protein, fruits but less meats.  She has nausea and diarrhea periodically, 4 episodes of diarrhea yesterday.  When she takes Imodium it is helpful.  She drinks lots of water.  She has "normal aches" she attributes to being less active due to low energy.  No significant neuropathy.  Denies fever, chills, cough, chest pain, dyspnea, or new concerns.  All other systems were reviewed with the patient and are negative.  MEDICAL HISTORY:  Past Medical History:  Diagnosis Date  . Allergy   . Anemia   . Anxiety   . Arthritis   . Asthma   . Depression   . Hypertension     SURGICAL HISTORY: Past Surgical History:  Procedure Laterality Date  . CHOLECYSTECTOMY  1993  . COLONOSCOPY    . 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 TRIGGER FINGER RELEASE Right 06/18/2014   Procedure: RIGHT LONG FINGER TRIGGER RELEASE;  Surgeon: [name], MD;  Location: [facility];  Service: Orthopedics;  Laterality: Right;  . TUBAL LIGATION    . VAGINAL HYSTERECTOMY Bilateral 11/04/2016   Procedure: HYSTERECTOMY VAGINAL with Bilateral Salpingectomy;  Surgeon: [name], MD;  Location: [facility];  Service: Gynecology;  Laterality: Bilateral;  . WEIL OSTEOTOMY Right 07/07/2020   Procedure: RIGHT FOOT WEIL OSTEOTOMY 2, 3, AND 4 METATARSALS AND PROXIMAL INTERPHALANGEAL JOINT FUSION 2 & 4 TOES;  Surgeon: Newt Minion, MD;  Location: [facility];  Service: Orthopedics;  Laterality: Right;    I have reviewed the social history and family history with the patient and they are unchanged from previous note.  ALLERGIES:  is allergic to elemental  sulfur and sulfa antibiotics.  MEDICATIONS:  Current Outpatient Medications  Medication Sig Dispense Refill  . calcium carbonate (OSCAL) 1500 (600 Ca) MG TABS tablet Take 600 mg of elemental calcium by mouth daily.    . chlorthalidone (HYGROTON) 25 MG tablet chlorthalidone 25 mg tablet    . Cholecalciferol (VITAMIN D3) 1.25 MG (50000 UT) CAPS Take 1 capsule by mouth once a week.    Marland Kitchen dexamethasone (DECADRON) 4 MG tablet Take by mouth.    . ferrous sulfate 325 (65 FE) MG tablet Take 325 mg by mouth 2 (two) times daily with a meal.    . KLOR-CON M20 20 MEQ tablet TAKE 1 TABLET BY MOUTH TWICE A DAY 60 tablet 0  . lenalidomide (REVLIMID) 25 MG capsule Take 1 capsule by mouth for 14 days and then 0 capsules for 7 days 14 capsule 0  . naproxen sodium (ALEVE) 220 MG tablet 2 (two) times daily.    . ondansetron (ZOFRAN) 8 MG tablet Take 1 tablet (8 mg total) by mouth every 8 (eight) hours as needed for nausea or vomiting. 20 tablet 3  . pantoprazole (PROTONIX) 20 MG tablet TAKE 1 TABLET BY MOUTH EVERY DAY 30 tablet 2  . prochlorperazine (COMPAZINE) 10 MG tablet Take 1 tablet (10 mg total) by mouth every 6 (six) hours as needed for nausea or vomiting. 30 tablet 3  . sertraline (ZOLOFT) 50 MG tablet sertraline 50 mg tablet    .  valACYclovir (VALTREX) 500 MG tablet Take 500 mg by mouth 2 (two) times daily.    . vitamin C (ASCORBIC ACID) 500 MG tablet      No current facility-administered medications for this visit.    PHYSICAL EXAMINATION: ECOG PERFORMANCE STATUS: 1 - Symptomatic but completely ambulatory  Vitals:   10/27/20 0918  BP: 120/83  Pulse: (!) 103  Resp: 20  Temp: (!) 97.2 F (36.2 C)  SpO2: 100%   Filed Weights   10/27/20 0918  Weight: 182 lb 3.2 oz (82.6 kg)    GENERAL:alert, no distress and comfortable SKIN: No rash EYES:  sclera clear OROPHARYNX: No thrush, ulcers, or obvious periodontal disease LUNGS: clear with normal breathing effort HEART: regular rate & rhythm, no  lower extremity edema NEURO: alert & oriented x 3 with fluent speech, no focal motor/sensory deficits  LABORATORY DATA:  I have reviewed the data as listed CBC Latest Ref Rng & Units 10/27/2020 10/19/2020 10/12/2020  WBC 4.0 - 10.5 K/uL 4.6 4.5 5.4  Hemoglobin 12.0 - 15.0 g/dL 11.0(L) 10.6(L) 11.6(L)  Hematocrit 36.0 - 46.0 % 32.9(L) 31.6(L) 34.5(L)  Platelets 150 - 400 K/uL 210 115(L) 120(L)     CMP Latest Ref Rng & Units 10/27/2020 10/19/2020 10/12/2020  Glucose 70 - 99 mg/dL 93 84 90  BUN 6 - 20 mg/dL _0 Creatinine 0.44 - 1.00 mg/dL 0.65 0.70 0.77  Sodium 135 - 145 mmol/L 140 140 139  Potassium 3.5 - 5.1 mmol/L 3.8 3.6 3.3(L)  Chloride 98 - 111 mmol/L 104 100 99  CO2 22 - 32 mmol/L _1 Calcium 8.9 - 10.3 mg/dL 9.1 9.0 8.5(L)  Total Protein 6.5 - 8.1 g/dL 6.0(L) 6.0(L) 6.5  Total Bilirubin 0.3 - 1.2 mg/dL 0.8 1.4(H) 1.1  Alkaline Phos 38 - 126 U/L 77 79 90  AST 15 - 41 U/L 21 37 47(H)  ALT 0 - 44 U/L 42 83(H) 86(H)      RADIOGRAPHIC STUDIES: I have personally reviewed the radiological images as listed and agreed with the findings in the report. No results found.   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  -Her MM labs from 06/01/20 showed increase inIgA and and M-proteinto 3.3. Her light chain is also increasing with ratio 87.53, which is suspicious for MM. -Her recent lumbar MRI showedmultiple T1 lesions in  her lumbarspine and sacrum compatible with MM. -Staging PET scan from 2/25/2022showed large hypermetabolic activity associated with the expansile soft tissue mass within the left and right pelvic bones, consistent with active MM/plasmacytoma, and a lytic lesion in the left scapula with mild activity.  -Due to her mobility issues and pain, she completed palliative RT to pelvis, scapula/left chest, T4-6 and T10-12 by Dr. Lisbeth [name] from 07/30/20 - 08/12/20 -She underwent staging bone marrow biopsy on 07/30/2020 which showed slightly hypercellular marrow for age and 31% plasma cells.  Cytogenetics and FISH were unremarkable -He started induction chemotherapy on 08/17/2020 with VRD (weekly Velcade and dexamethasone, Revlimid 2 weeks on 1 week off) on 08/17/2020 -MM labs 10/09/20 shows IgA  and light chains normalized and M protein decreased to 0.3 (from 3.4 prior to induction), consistent with an excellent response to treatment -She was seen in [facility] at [facility] by Dr. Aris Lot who plans to proceed with treatment transplant process soon, the date is not set -continue RVD, Follow-up in 2 weeks  2.Low back pain, right hip pain  -She initially attributes her pain to sciatica pain.  -work up per ortho showedmultiple T1 lesions of her spine and sacrum compatible with MM on her 07/03/20 MRI lumbar spine. -PET 07/24/20 showedlarge hypermetabolic lesions in the left and right pelvis consistent with MM/plasmacytoma and a faintly metabolic lesion in the left scapula which is not currently causing significant pain. -She completed palliative radiation to the pelvis, left scapula, T4-6, and T10-12 on 3/3-3/16/2022 -She began monthly Zometa and oral calcium on 08/03/2020  -Pain significantly improved after RT, she has not required pain medication recently   3. 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  -Hemoglobin electrophoresisshowed increase  in Hg C, consistent with Hg C trait.She is aware her children are at risk ofhemoglobin C trait,they have beenencouraged to be tested. -Anemiaworsened recently with MM -Continue oral iron  4. HTN, OA, Obesity -On chlorthalidonefor HTN  -She will continue to f/u with PCP, Chiropractor and Ortho.  5.Anxiety, depression -under care of [facility] provider, toleratingZoloft -She is overwhelmed with the diagnosis and information but otherwise stable. -appears improved   Disposition: Ms. Kohut appears stable.  She continues RVD, with weekly Dex and Velcade, and Revlimid on days 1-14 q. 21 days; she started this Revlimid cycle on 10/26/2020.  Tolerating treatment well with fatigue, altered taste, nausea, and diarrhea.  Side effects are well managed with supportive care at home.  She is able to recover and function well.  We reviewed symptom management and nutrition/hydration.  Labs reviewed, CBC and CMP adequate to continue RVD.  We reviewed her most recent myeloma panel which shows she is responding very well to induction.  Continue RVD until transplant process begins, follow-up in 2 weeks.  She will follow up with Dr. Aris Lot at [facility] soon.   The plan was discussed with Dr. Burr Medico.  All questions were answered. The patient knows to call the clinic with any problems, questions or concerns. No barriers to learning were detected.     Alla Feeling, NP 10/27/20

## 2020-10-27 ENCOUNTER — Inpatient Hospital Stay: Payer: BC Managed Care – PPO

## 2020-10-27 ENCOUNTER — Other Ambulatory Visit: Payer: Self-pay

## 2020-10-27 ENCOUNTER — Inpatient Hospital Stay (HOSPITAL_BASED_OUTPATIENT_CLINIC_OR_DEPARTMENT_OTHER): Payer: BC Managed Care – PPO | Admitting: Nurse Practitioner

## 2020-10-27 ENCOUNTER — Encounter: Payer: Self-pay | Admitting: Nurse Practitioner

## 2020-10-27 VITALS — BP 120/83 | HR 103 | Temp 97.2°F | Resp 20 | Ht 66.0 in | Wt 182.2 lb

## 2020-10-27 VITALS — HR 97

## 2020-10-27 DIAGNOSIS — C9000 Multiple myeloma not having achieved remission: Secondary | ICD-10-CM

## 2020-10-27 DIAGNOSIS — I10 Essential (primary) hypertension: Secondary | ICD-10-CM | POA: Diagnosis not present

## 2020-10-27 DIAGNOSIS — F419 Anxiety disorder, unspecified: Secondary | ICD-10-CM | POA: Diagnosis not present

## 2020-10-27 DIAGNOSIS — M1990 Unspecified osteoarthritis, unspecified site: Secondary | ICD-10-CM | POA: Diagnosis not present

## 2020-10-27 DIAGNOSIS — F32A Depression, unspecified: Secondary | ICD-10-CM | POA: Diagnosis not present

## 2020-10-27 DIAGNOSIS — Z5112 Encounter for antineoplastic immunotherapy: Secondary | ICD-10-CM | POA: Diagnosis not present

## 2020-10-27 DIAGNOSIS — D509 Iron deficiency anemia, unspecified: Secondary | ICD-10-CM | POA: Diagnosis not present

## 2020-10-27 DIAGNOSIS — E669 Obesity, unspecified: Secondary | ICD-10-CM | POA: Diagnosis not present

## 2020-10-27 LAB — CBC WITH DIFFERENTIAL (CANCER CENTER ONLY)
Abs Immature Granulocytes: 0.03 10*3/uL (ref 0.00–0.07)
Basophils Absolute: 0 10*3/uL (ref 0.0–0.1)
Basophils Relative: 0 %
Eosinophils Absolute: 0.2 10*3/uL (ref 0.0–0.5)
Eosinophils Relative: 4 %
HCT: 32.9 % — ABNORMAL LOW (ref 36.0–46.0)
Hemoglobin: 11 g/dL — ABNORMAL LOW (ref 12.0–15.0)
Immature Granulocytes: 1 %
Lymphocytes Relative: 21 %
Lymphs Abs: 1 10*3/uL (ref 0.7–4.0)
MCH: 25.3 pg — ABNORMAL LOW (ref 26.0–34.0)
MCHC: 33.4 g/dL (ref 30.0–36.0)
MCV: 75.8 fL — ABNORMAL LOW (ref 80.0–100.0)
Monocytes Absolute: 0.6 10*3/uL (ref 0.1–1.0)
Monocytes Relative: 12 %
Neutro Abs: 2.8 10*3/uL (ref 1.7–7.7)
Neutrophils Relative %: 62 %
Platelet Count: 210 10*3/uL (ref 150–400)
RBC: 4.34 MIL/uL (ref 3.87–5.11)
RDW: 19.9 % — ABNORMAL HIGH (ref 11.5–15.5)
WBC Count: 4.6 10*3/uL (ref 4.0–10.5)
nRBC: 0 % (ref 0.0–0.2)

## 2020-10-27 LAB — CMP (CANCER CENTER ONLY)
ALT: 42 U/L (ref 0–44)
AST: 21 U/L (ref 15–41)
Albumin: 3.5 g/dL (ref 3.5–5.0)
Alkaline Phosphatase: 77 U/L (ref 38–126)
Anion gap: 9 (ref 5–15)
BUN: 14 mg/dL (ref 6–20)
CO2: 27 mmol/L (ref 22–32)
Calcium: 9.1 mg/dL (ref 8.9–10.3)
Chloride: 104 mmol/L (ref 98–111)
Creatinine: 0.65 mg/dL (ref 0.44–1.00)
GFR, Estimated: >60 mL/min (ref >60)
Glucose, Bld: 93 mg/dL (ref 70–99)
Potassium: 3.8 mmol/L (ref 3.5–5.1)
Sodium: 140 mmol/L (ref 135–145)
Total Bilirubin: 0.8 mg/dL (ref 0.3–1.2)
Total Protein: 6 g/dL — ABNORMAL LOW (ref 6.5–8.1)

## 2020-10-27 MED ORDER — DEXAMETHASONE 4 MG PO TABS
20.0000 mg | ORAL_TABLET | Freq: Once | ORAL | Status: AC
  Administered 2020-10-27: 20 mg via ORAL

## 2020-10-27 MED ORDER — BORTEZOMIB CHEMO SQ INJECTION 3.5 MG (2.5MG/ML)
1.3000 mg/m2 | Freq: Once | INTRAMUSCULAR | Status: AC
  Administered 2020-10-27: 2.75 mg via SUBCUTANEOUS
  Filled 2020-10-27: qty 1.1

## 2020-10-27 MED ORDER — DEXAMETHASONE 4 MG PO TABS
ORAL_TABLET | ORAL | Status: AC
  Filled 2020-10-27: qty 5

## 2020-10-28 ENCOUNTER — Telehealth: Payer: Self-pay | Admitting: Hematology

## 2020-10-28 NOTE — Telephone Encounter (Signed)
Scheduled follow-up appointments per 5/31 los. Patient is aware. 

## 2020-11-01 ENCOUNTER — Other Ambulatory Visit: Payer: Self-pay | Admitting: Nurse Practitioner

## 2020-11-01 DIAGNOSIS — C9000 Multiple myeloma not having achieved remission: Secondary | ICD-10-CM

## 2020-11-02 ENCOUNTER — Other Ambulatory Visit: Payer: Self-pay

## 2020-11-02 ENCOUNTER — Inpatient Hospital Stay: Payer: BC Managed Care – PPO

## 2020-11-02 ENCOUNTER — Encounter: Payer: Self-pay | Admitting: Nurse Practitioner

## 2020-11-02 ENCOUNTER — Ambulatory Visit: Payer: BC Managed Care – PPO | Admitting: Nurse Practitioner

## 2020-11-02 ENCOUNTER — Encounter: Payer: Self-pay | Admitting: Hematology

## 2020-11-02 ENCOUNTER — Inpatient Hospital Stay: Payer: BC Managed Care – PPO | Attending: Hematology

## 2020-11-02 VITALS — BP 96/73 | HR 86 | Temp 98.8°F | Resp 18 | Wt 178.0 lb

## 2020-11-02 DIAGNOSIS — Z5112 Encounter for antineoplastic immunotherapy: Secondary | ICD-10-CM | POA: Diagnosis not present

## 2020-11-02 DIAGNOSIS — C9000 Multiple myeloma not having achieved remission: Secondary | ICD-10-CM | POA: Diagnosis not present

## 2020-11-02 DIAGNOSIS — I10 Essential (primary) hypertension: Secondary | ICD-10-CM | POA: Insufficient documentation

## 2020-11-02 DIAGNOSIS — D509 Iron deficiency anemia, unspecified: Secondary | ICD-10-CM | POA: Diagnosis not present

## 2020-11-02 DIAGNOSIS — M1990 Unspecified osteoarthritis, unspecified site: Secondary | ICD-10-CM | POA: Insufficient documentation

## 2020-11-02 DIAGNOSIS — F32A Depression, unspecified: Secondary | ICD-10-CM | POA: Diagnosis not present

## 2020-11-02 DIAGNOSIS — F419 Anxiety disorder, unspecified: Secondary | ICD-10-CM | POA: Insufficient documentation

## 2020-11-02 DIAGNOSIS — E669 Obesity, unspecified: Secondary | ICD-10-CM | POA: Diagnosis not present

## 2020-11-02 LAB — CBC WITH DIFFERENTIAL (CANCER CENTER ONLY)
Abs Immature Granulocytes: 0.16 10*3/uL — ABNORMAL HIGH (ref 0.00–0.07)
Basophils Absolute: 0 10*3/uL (ref 0.0–0.1)
Basophils Relative: 1 %
Eosinophils Absolute: 0.2 10*3/uL (ref 0.0–0.5)
Eosinophils Relative: 4 %
HCT: 30.3 % — ABNORMAL LOW (ref 36.0–46.0)
Hemoglobin: 10.5 g/dL — ABNORMAL LOW (ref 12.0–15.0)
Immature Granulocytes: 3 %
Lymphocytes Relative: 16 %
Lymphs Abs: 0.8 10*3/uL (ref 0.7–4.0)
MCH: 25.8 pg — ABNORMAL LOW (ref 26.0–34.0)
MCHC: 34.7 g/dL (ref 30.0–36.0)
MCV: 74.4 fL — ABNORMAL LOW (ref 80.0–100.0)
Monocytes Absolute: 0.2 10*3/uL (ref 0.1–1.0)
Monocytes Relative: 4 %
Neutro Abs: 3.5 10*3/uL (ref 1.7–7.7)
Neutrophils Relative %: 72 %
Platelet Count: 132 10*3/uL — ABNORMAL LOW (ref 150–400)
RBC: 4.07 MIL/uL (ref 3.87–5.11)
RDW: 19.6 % — ABNORMAL HIGH (ref 11.5–15.5)
WBC Count: 4.8 10*3/uL (ref 4.0–10.5)
nRBC: 0.6 % — ABNORMAL HIGH (ref 0.0–0.2)

## 2020-11-02 LAB — CMP (CANCER CENTER ONLY)
ALT: 38 U/L (ref 0–44)
AST: 23 U/L (ref 15–41)
Albumin: 3.6 g/dL (ref 3.5–5.0)
Alkaline Phosphatase: 79 U/L (ref 38–126)
Anion gap: 11 (ref 5–15)
BUN: 10 mg/dL (ref 6–20)
CO2: 28 mmol/L (ref 22–32)
Calcium: 8.7 mg/dL — ABNORMAL LOW (ref 8.9–10.3)
Chloride: 102 mmol/L (ref 98–111)
Creatinine: 0.69 mg/dL (ref 0.44–1.00)
GFR, Estimated: >60 mL/min (ref >60)
Glucose, Bld: 94 mg/dL (ref 70–99)
Potassium: 3.2 mmol/L — ABNORMAL LOW (ref 3.5–5.1)
Sodium: 141 mmol/L (ref 135–145)
Total Bilirubin: 1.1 mg/dL (ref 0.3–1.2)
Total Protein: 6 g/dL — ABNORMAL LOW (ref 6.5–8.1)

## 2020-11-02 MED ORDER — ZOLEDRONIC ACID 4 MG/100ML IV SOLN
INTRAVENOUS | Status: AC
  Filled 2020-11-02: qty 100

## 2020-11-02 MED ORDER — BORTEZOMIB CHEMO SQ INJECTION 3.5 MG (2.5MG/ML)
1.3000 mg/m2 | Freq: Once | INTRAMUSCULAR | Status: AC
  Administered 2020-11-02: 2.75 mg via SUBCUTANEOUS
  Filled 2020-11-02: qty 1.1

## 2020-11-02 MED ORDER — DEXAMETHASONE 4 MG PO TABS
20.0000 mg | ORAL_TABLET | Freq: Once | ORAL | Status: AC
  Administered 2020-11-02: 20 mg via ORAL

## 2020-11-02 MED ORDER — DEXAMETHASONE 4 MG PO TABS
ORAL_TABLET | ORAL | Status: AC
  Filled 2020-11-02: qty 5

## 2020-11-02 MED ORDER — SODIUM CHLORIDE 0.9 % IV SOLN
INTRAVENOUS | Status: DC
  Filled 2020-11-02: qty 250

## 2020-11-02 MED ORDER — ZOLEDRONIC ACID 4 MG/100ML IV SOLN
4.0000 mg | Freq: Once | INTRAVENOUS | Status: AC
  Administered 2020-11-02: 4 mg via INTRAVENOUS

## 2020-11-02 NOTE — Patient Instructions (Signed)
[facility] [facility] [employer] ONCOLOGY  Discharge Instructions: Thank you for choosing [facility] to provide your oncology and hematology care.   If you have a lab appointment with the [facility], please go directly to the [facility] and check in at the registration area.   Wear comfortable clothing and clothing appropriate for easy access to any Portacath or PICC line.   We strive to give you quality time with your provider. You may need to reschedule your appointment if you arrive late (15 or more minutes).  Arriving late affects you and other patients whose appointments are after yours.  Also, if you miss three or more appointments without notifying the office, you may be dismissed from the clinic at the provider's discretion.      For prescription refill requests, have your pharmacy contact our office and allow 72 hours for refills to be completed.    Today you received the following chemotherapy and/or immunotherapy agents Velcade & Zometa    To help prevent nausea and vomiting after your treatment, we encourage you to take your nausea medication as directed.  BELOW ARE SYMPTOMS THAT SHOULD BE REPORTED IMMEDIATELY: . *FEVER GREATER THAN 100.4 F (38 C) OR HIGHER . *CHILLS OR SWEATING . *NAUSEA AND VOMITING THAT IS NOT CONTROLLED WITH YOUR NAUSEA MEDICATION . *UNUSUAL SHORTNESS OF BREATH . *UNUSUAL BRUISING OR BLEEDING . *URINARY PROBLEMS (pain or burning when urinating, or frequent urination) . *BOWEL PROBLEMS (unusual diarrhea, constipation, pain near the anus) . TENDERNESS IN MOUTH AND THROAT WITH OR WITHOUT PRESENCE OF ULCERS (sore throat, sores in mouth, or a toothache) . UNUSUAL RASH, SWELLING OR PAIN  . UNUSUAL VAGINAL DISCHARGE OR ITCHING   Items with * indicate a potential emergency and should be followed up as soon as possible or go to the Emergency Department if any problems should occur.  Please show the CHEMOTHERAPY ALERT CARD or IMMUNOTHERAPY  ALERT CARD at check-in to the Emergency Department and triage nurse.  Should you have questions after your visit or need to cancel or reschedule your appointment, please contact [facility]  Dept: [phone]  and follow the prompts.  Office hours are 8:00 a.m. to 4:30 p.m. Monday - Friday. Please note that voicemails left after 4:00 p.m. may not be returned until the following business day.  We are closed weekends and major holidays. You have access to a nurse at all times for urgent questions. Please call the main number to the clinic Dept: [phone] and follow the prompts.   For any non-urgent questions, you may also contact your provider using MyChart. We now offer e-Visits for anyone 44 and older to request care online for non-urgent symptoms. For details visit [email or web address].   Also download the MyChart app! Go to the app store, search "MyChart", open the app, select [facility], and log in with your MyChart username and password.  Due to Covid, a mask is required upon entering the hospital/clinic. If you do not have a mask, one will be given to you upon arrival. For doctor visits, patients may have 1 support person aged 23 or older with them. For treatment visits, patients cannot have anyone with them due to current Covid guidelines and our immunocompromised population.   Bortezomib injection What is this medicine? BORTEZOMIB (bor TEZ oh mib) targets proteins in cancer cells and stops the cancer cells from growing. It treats multiple myeloma and mantle cell lymphoma. This medicine may be used for other purposes;  ask your health care provider or pharmacist if you have questions. COMMON BRAND NAME(S): Velcade What should I tell my health care provider before I take this medicine? They need to know if you have any of these conditions:  dehydration  diabetes (high blood sugar)  heart disease  liver disease  tingling of the fingers or toes or  other nerve disorder  an unusual or allergic reaction to bortezomib, mannitol, boron, other medicines, foods, dyes, or preservatives  pregnant or trying to get pregnant  breast-feeding How should I use this medicine? This medicine is injected into a vein or under the skin. It is given by a health care provider in a hospital or clinic setting. Talk to your health care provider about the use of this medicine in children. Special care may be needed. Overdosage: If you think you have taken too much of this medicine contact a poison control center or emergency room at once. NOTE: This medicine is only for you. Do not share this medicine with others. What if I miss a dose? Keep appointments for follow-up doses. It is important not to miss your dose. Call your health care provider if you are unable to keep an appointment. What may interact with this medicine? This medicine may interact with the following medications:  ketoconazole  rifampin This list may not describe all possible interactions. Give your health care provider a list of all the medicines, herbs, non-prescription drugs, or dietary supplements you use. Also tell them if you smoke, drink alcohol, or use illegal drugs. Some items may interact with your medicine. What should I watch for while using this medicine? Your condition will be monitored carefully while you are receiving this medicine. You may need blood work done while you are taking this medicine. You may get drowsy or dizzy. Do not drive, use machinery, or do anything that needs mental alertness until you know how this medicine affects you. Do not stand up or sit up quickly, especially if you are an older patient. This reduces the risk of dizzy or fainting spells This medicine may increase your risk of getting an infection. Call your health care provider for advice if you get a fever, chills, sore throat, or other symptoms of a cold or flu. Do not treat yourself. Try to avoid  being around people who are sick. Check with your health care provider if you have severe diarrhea, nausea, and vomiting, or if you sweat a lot. The loss of too much body fluid may make it dangerous for you to take this medicine. Do not become pregnant while taking this medicine or for 7 months after stopping it. Women should inform their health care provider if they wish to become pregnant or think they might be pregnant. Men should not father a child while taking this medicine and for 4 months after stopping it. There is a potential for serious harm to an unborn child. Talk to your health care provider for more information. Do not breast-feed an infant while taking this medicine or for 2 months after stopping it. This medicine may make it more difficult to get pregnant or father a child. Talk to your health care provider if you are concerned about your fertility. What side effects may I notice from receiving this medicine? Side effects that you should report to your doctor or health care professional as soon as possible:  allergic reactions (skin rash; itching or hives; swelling of the face, lips, or tongue)  bleeding (bloody or black, tarry  stools; red or dark brown urine; spitting up blood or brown material that looks like coffee grounds; red spots on the skin; unusual bruising or bleeding from the eye, gums, or nose)  blurred vision or changes in vision  confusion  constipation  headache  heart failure (trouble breathing; fast, irregular heartbeat; sudden weight gain; swelling of the ankles, feet, hands)  infection (fever, chills, cough, sore throat, pain or trouble passing urine)  lack or loss of appetite  liver injury (dark yellow or brown urine; general ill feeling or flu-like symptoms; loss of appetite, right upper belly pain; yellowing of the eyes or skin)  low blood pressure (dizziness; feeling faint or lightheaded, falls; unusually weak or tired)  muscle cramps  pain,  redness, or irritation at site where injected  pain, tingling, numbness in the hands or feet  seizures  trouble breathing  unusual bruising or bleeding Side effects that usually do not require medical attention (report to your doctor or health care professional if they continue or are bothersome):  diarrhea  nausea  stomach pain  trouble sleeping  vomiting This list may not describe all possible side effects. Call your doctor for medical advice about side effects. You may report side effects to FDA at 1-800-FDA-1088. Where should I keep my medicine? This medicine is given in a hospital or clinic. It will not be stored at home. NOTE: This sheet is a summary. It may not cover all possible information. If you have questions about this medicine, talk to your doctor, pharmacist, or health care provider.  2021 Elsevier/Gold Standard (2020-05-07 13:22:53)  Zoledronic Acid Injection (Hypercalcemia, Oncology) What is this medicine? ZOLEDRONIC ACID (ZOE le dron ik AS id) slows calcium loss from bones. It high calcium levels in the blood from some kinds of cancer. It may be used in other people at risk for bone loss. This medicine may be used for other purposes; ask your health care provider or pharmacist if you have questions. COMMON BRAND NAME(S): Zometa What should I tell my health care provider before I take this medicine? They need to know if you have any of these conditions:  cancer  dehydration  dental disease  kidney disease  liver disease  low levels of calcium in the blood  lung or breathing disease (asthma)  receiving steroids like dexamethasone or prednisone  an unusual or allergic reaction to zoledronic acid, other medicines, foods, dyes, or preservatives  pregnant or trying to get pregnant  breast-feeding How should I use this medicine? This drug is injected into a vein. It is given by a health care provider in a hospital or clinic setting. Talk to your  health care provider about the use of this drug in children. Special care may be needed. Overdosage: If you think you have taken too much of this medicine contact a poison control center or emergency room at once. NOTE: This medicine is only for you. Do not share this medicine with others. What if I miss a dose? Keep appointments for follow-up doses. It is important not to miss your dose. Call your health care provider if you are unable to keep an appointment. What may interact with this medicine?  certain antibiotics given by injection  NSAIDs, medicines for pain and inflammation, like ibuprofen or naproxen  some diuretics like bumetanide, furosemide  teriparatide  thalidomide This list may not describe all possible interactions. Give your health care provider a list of all the medicines, herbs, non-prescription drugs, or dietary supplements you use. Also  tell them if you smoke, drink alcohol, or use illegal drugs. Some items may interact with your medicine. What should I watch for while using this medicine? Visit your health care provider for regular checks on your progress. It may be some time before you see the benefit from this drug. Some people who take this drug have severe bone, joint, or muscle pain. This drug may also increase your risk for jaw problems or a broken thigh bone. Tell your health care provider right away if you have severe pain in your jaw, bones, joints, or muscles. Tell you health care provider if you have any pain that does not go away or that gets worse. Tell your dentist and dental surgeon that you are taking this drug. You should not have major dental surgery while on this drug. See your dentist to have a dental exam and fix any dental problems before starting this drug. Take good care of your teeth while on this drug. Make sure you see your dentist for regular follow-up appointments. You should make sure you get enough calcium and vitamin D while you are taking  this drug. Discuss the foods you eat and the vitamins you take with your health care provider. Check with your health care provider if you have severe diarrhea, nausea, and vomiting, or if you sweat a lot. The loss of too much body fluid may make it dangerous for you to take this drug. You may need blood work done while you are taking this drug. Do not become pregnant while taking this drug. Women should inform their health care provider if they wish to become pregnant or think they might be pregnant. There is potential for serious harm to an unborn child. Talk to your health care provider for more information. What side effects may I notice from receiving this medicine? Side effects that you should report to your doctor or health care provider as soon as possible:  allergic reactions (skin rash, itching or hives; swelling of the face, lips, or tongue)  bone pain  infection (fever, chills, cough, sore throat, pain or trouble passing urine)  jaw pain, especially after dental work  joint pain  kidney injury (trouble passing urine or change in the amount of urine)  low blood pressure (dizziness; feeling faint or lightheaded, falls; unusually weak or tired)  low calcium levels (fast heartbeat; muscle cramps or pain; pain, tingling, or numbness in the hands or feet; seizures)  low magnesium levels (fast, irregular heartbeat; muscle cramp or pain; muscle weakness; tremors; seizures)  low red blood cell counts (trouble breathing; feeling faint; lightheaded, falls; unusually weak or tired)  muscle pain  redness, blistering, peeling, or loosening of the skin, including inside the mouth  severe diarrhea  swelling of the ankles, feet, hands  trouble breathing Side effects that usually do not require medical attention (report to your doctor or health care provider if they continue or are bothersome):  anxious  constipation  coughing  depressed mood  eye irritation, itching, or  pain  fever  general ill feeling or flu-like symptoms  nausea  pain, redness, or irritation at site where injected  trouble sleeping This list may not describe all possible side effects. Call your doctor for medical advice about side effects. You may report side effects to FDA at 1-800-FDA-1088. Where should I keep my medicine? This drug is given in a hospital or clinic. It will not be stored at home. NOTE: This sheet is a summary. It may not cover  all possible information. If you have questions about this medicine, talk to your doctor, pharmacist, or health care provider.  2021 Elsevier/Gold Standard (2019-02-28 09:13:00)

## 2020-11-04 ENCOUNTER — Other Ambulatory Visit: Payer: Self-pay | Admitting: Hematology

## 2020-11-04 DIAGNOSIS — C9000 Multiple myeloma not having achieved remission: Secondary | ICD-10-CM

## 2020-11-08 ENCOUNTER — Other Ambulatory Visit: Payer: Self-pay | Admitting: Hematology

## 2020-11-08 NOTE — Progress Notes (Signed)
**Hannah [name] [name] De-Identified via Obfuscation** Hannah [name]   [phone] [phone]   Clinic Follow up Hannah [name] [name]   Patient Care Team: Tisovec, Fransico Him, MD as PCP - General (Internal Medicine) Bo Merino, MD as Consulting Physician (Rheumatology) 11/09/2020  CHIEF COMPLAINT: Follow up multiple myeloma   SUMMARY OF ONCOLOGIC HISTORY: Oncology History Overview Hannah [name] [name]  Cancer Staging Multiple myeloma ([place]) Staging form: Plasma Cell Myeloma and Plasma Cell Disorders, AJCC 8th Edition - Clinical stage from 07/20/2020: Beta-2-microglobulin (mg/L): 2.4, Albumin (g/dL): 3.2, ISS: Stage II, High-risk cytogenetics: Unknown, LDH: Normal - Signed by Truitt Merle, MD on 08/02/2020 Beta 2 microglobulin range (mg/L): Less than 3.5 Albumin range (g/dL): Less than 3.5 Cytogenetics: Unknown    Multiple myeloma ([place])  07/03/2020 Imaging   MRI Lumbar Spine  IMPRESSION: 1. Numerous T1 hypointense and STIR hyperintense lesions throughout the visualized spine and sacrum with multiple areas of extraosseous extension in the sacrum, detailed above and compatible with multiple myeloma given the clinical history. 2. An MRI of the pelvis with contrast could evaluate the full extent of the partially imaged sacral lesions, including left S1-S2 neural foraminal involvement and suspected involvement of the exited left L5 nerve. 3. Multilevel degenerative change without significant canal or foraminal stenosis in the lumbar spine.   07/13/2020 Initial Diagnosis   Multiple myeloma ([place])    07/20/2020 Cancer Staging   Staging form: Plasma Cell Myeloma and Plasma Cell Disorders, AJCC 8th Edition - Clinical stage from 07/20/2020: Beta-2-microglobulin (mg/L): 2.4, Albumin (g/dL): 3.2, ISS: Stage II, High-risk cytogenetics: Unknown, LDH: Normal - Signed by Truitt Merle, MD on 08/31/2020  Beta 2 microglobulin range (mg/L): Less than 3.5  Albumin range (g/dL): Less than 3.5  Cytogenetics: No abnormalities  Bone disease on imaging:  Present    07/24/2020 PET scan   IMPRESSION: 1. Moderate to high hypermetabolic activity associated with the expansile soft tissue masses within the LEFT and RIGHT pelvic bones is consistent with active multiple myeloma / plasmacytoma. 2. Lytic lesion in the LEFT scapula with mild metabolic activity is indeterminate. 3. Metabolic activity associated with the RIGHT knee prosthetic and RIGHT distal foot fusion is favored post procedural inflammation.     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  The findings are most consistent with  persistent/recurrent/previously known plasma cell neoplasm.  For  completeness, immunohistochemical stain for CD138 and in situ  hybridization for kappa and lambda will be performed and the results  reported in an addendum.  Correlation with cytogenetic and FISH studies  is recommended.   08/03/2020 -  Chemotherapy   Zometa starting 08/03/20    08/17/2020 -  Chemotherapy   Velcade weekly, Revlimid, dex $RemoveBefor'20mg'[id number]$  weekly (VRd)  Starting 08/17/20.       CURRENT THERAPY: Zometa starting 08/03/20 Velcade, Revlimid, dex (VRd) 2 weeks on/1 week off starting 08/17/20.   INTERVAL HISTORY: [name] was seen in infusion room, she returns for follow up and treatment as scheduled. She continues VRd, on her week off Revlimid.  She  is doing well without significant complaints.  Her energy/activity level has increased.  Mild nausea without  vomiting is managed with Zofran on Revlimid, improves on her week off.  Bowels moving slowly, she is not eating very well due to lack of taste and smell aversions.  She noticed mild swelling in the right foot and intermittent tingling in the toes, she had toe surgery and knee replacement on that side prior to initiating treatment.  No functional limitations.   Otherwise denies mucositis, new or worsening pain, fever, chills, cough, chest pain, dyspnea.  She has transplant work up/evaluation at [facility] later this month.    MEDICAL HISTORY:  Past Medical History:  Diagnosis Date   Allergy    Anemia    Anxiety    Arthritis    Asthma    Depression    Hypertension     SURGICAL HISTORY: Past Surgical History:  Procedure Laterality Date   CHOLECYSTECTOMY  1993   COLONOSCOPY     TOTAL KNEE ARTHROPLASTY Right 01/28/2016   TOTAL KNEE ARTHROPLASTY Right 01/28/2016   Procedure: RIGHT TOTAL KNEE ARTHROPLASTY;  Surgeon: [name], MD;  Location: [facility];  Service: Orthopedics;  Laterality: Right;   TRIGGER FINGER RELEASE Right 06/18/2014   Procedure: RIGHT LONG FINGER TRIGGER RELEASE;  Surgeon: [name], MD;  Location: [facility];  Service: Orthopedics;  Laterality: Right;   TUBAL LIGATION     VAGINAL HYSTERECTOMY Bilateral 11/04/2016   Procedure: HYSTERECTOMY VAGINAL with Bilateral Salpingectomy;  Surgeon: [name], MD;  Location: [facility];  Service: Gynecology;  Laterality: Bilateral;   WEIL OSTEOTOMY Right 07/07/2020   Procedure: RIGHT FOOT WEIL OSTEOTOMY 2, 3, AND 4 METATARSALS AND PROXIMAL INTERPHALANGEAL JOINT FUSION 2 & 4 TOES;  Surgeon: Newt Minion, MD;  Location: [facility];  Service: Orthopedics;  Laterality: Right;    I have reviewed the social history and family history with the patient and they are unchanged from previous Hannah [name] [name].  ALLERGIES:  is allergic to elemental sulfur and sulfa antibiotics.  MEDICATIONS:  Current Outpatient  Medications  Medication Sig Dispense Refill   calcium carbonate (OSCAL) 1500 (600 Ca) MG TABS tablet Take 600 mg of elemental calcium by mouth daily.     chlorthalidone (HYGROTON) 25 MG tablet chlorthalidone 25 mg tablet     Cholecalciferol (VITAMIN D3) 1.25 MG (50000 UT) CAPS Take 1 capsule by mouth once a week.     dexamethasone (DECADRON) 4 MG tablet Take by mouth.     ferrous sulfate 325 (65 FE) MG tablet Take 325 mg by mouth 2 (two) times daily with a meal.     KLOR-CON M20 20 MEQ tablet TAKE 1 TABLET BY MOUTH TWICE A DAY 180 tablet 1   lenalidomide (REVLIMID) 25 MG capsule TAKE 1 CAPSULE BY MOUTH ONCE DAILY FOR 14 DAYS ON AND 7 DAYS OFF 14 capsule 0   naproxen sodium (ALEVE) 220 MG tablet 2 (two) times daily.     ondansetron (ZOFRAN) 8 MG tablet Take 1 tablet (8 mg total) by mouth every 8 (eight) hours as needed for nausea or vomiting. 20 tablet 3   pantoprazole (PROTONIX) 20 MG tablet TAKE 1 TABLET BY MOUTH EVERY DAY 30 tablet 2   prochlorperazine (COMPAZINE) 10 MG tablet Take 1 tablet (10 mg total) by mouth every 6 (six) hours as needed for nausea or vomiting. 30 tablet 3   sertraline (ZOLOFT) 50 MG tablet sertraline 50 mg tablet     valACYclovir (  VALTREX) 500 MG tablet Take 500 mg by mouth 2 (two) times daily.     vitamin C (ASCORBIC ACID) 500 MG tablet      No current facility-administered medications for this visit.    PHYSICAL EXAMINATION: ECOG PERFORMANCE STATUS: 1 - Symptomatic but completely ambulatory  There were no vitals filed for this visit. There were no vitals filed for this visit.  GENERAL:alert, no distress and comfortable SKIN: no rash  EYES: sclera clear OROPHARYNX: no thrush or ulcers LUNGS: clear with normal breathing effort HEART: regular rate & rhythm, no lower extremity edema NEURO: alert & oriented x 3 with fluent speech, no focal motor/sensory deficits   LABORATORY DATA:  I have reviewed the data as listed CBC Latest Ref Rng & Units 11/09/2020  11/02/2020 10/27/2020  WBC 4.0 - 10.5 K/uL 4.7 4.8 4.6  Hemoglobin 12.0 - 15.0 g/dL 10.3(L) 10.5(L) 11.0(L)  Hematocrit 36.0 - 46.0 % 30.6(L) 30.3(L) 32.9(L)  Platelets 150 - 400 K/uL 102(L) 132(L) 210     CMP Latest Ref Rng & Units 11/09/2020 11/02/2020 10/27/2020  Glucose 70 - 99 mg/dL 78 94 93  BUN 6 - 20 mg/dL _0 Creatinine 0.44 - 1.00 mg/dL 0.62 0.69 0.65  Sodium 135 - 145 mmol/L 141 141 140  Potassium 3.5 - 5.1 mmol/L 3.5 3.2(L) 3.8  Chloride 98 - 111 mmol/L 103 102 104  CO2 22 - 32 mmol/L _1 Calcium 8.9 - 10.3 mg/dL 8.5(L) 8.7(L) 9.1  Total Protein 6.5 - 8.1 g/dL 5.9(L) 6.0(L) 6.0(L)  Total Bilirubin 0.3 - 1.2 mg/dL 1.4(H) 1.1 0.8  Alkaline Phos 38 - 126 U/L 75 79 77  AST 15 - 41 U/L _2 ALT 0 - 44 U/L 32 38 42      RADIOGRAPHIC STUDIES: I have personally reviewed the radiological images as listed and agreed with the findings in the report. No results found.   ASSESSMENT & PLAN: ALEYNA CUEVA is a 60 y.o. female with     1. 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 Initial pelvic and lumbar MRI in 2019 and 11/2019 bone survey was negative for myeloma involvement. -Her MM labs from 06/01/20 showed increase in IgA and and M-proteinto 3.3. Her light chain is also increasing with ratio 87.53, which is suspicious for MM. -Her recent lumbar MRI showed multiple T1 lesions in her lumbar spine and sacrum compatible with MM. -Staging PET scan from 07/24/2020 showed large hypermetabolic activity associated with the expansile soft tissue mass within the left and right pelvic bones,  consistent with active MM/plasmacytoma, and a lytic lesion in the left scapula with mild activity.   -Due to her mobility issues and pain, she completed palliative RT to pelvis, scapula/left chest, T4-6 and T10-12 by Dr. Lisbeth Renshaw from 07/30/20 - 08/12/20 -She underwent staging bone marrow biopsy on 07/30/2020 which showed slightly hypercellular marrow for age and 31% plasma cells.  Cytogenetics and FISH were unremarkable -He started induction chemotherapy on 08/17/2020 with VRD (weekly Velcade and dexamethasone, Revlimid 2 weeks on 1 week off) on 08/17/2020 -MM labs 10/09/20 shows IgA  and light chains normalized and M protein decreased to 0.3 (from 3.4 prior to induction), consistent with an excellent response to treatment -She was seen in consult [facility] by Dr. Aris Lot, transplant eval/work up scheduled 11/25/20.  -continue RVD, Follow-up in 2 weeks   2. Low back pain, right hip pain -She initially attributes her pain to sciatica pain. -work up per ortho showed multiple T1 lesions of her spine and sacrum compatible with MM on her 07/03/20 MRI lumbar spine. -PET 07/24/20 showed large hypermetabolic lesions in the left and right pelvis consistent with MM/plasmacytoma and a faintly metabolic lesion in the left scapula which is not currently causing significant pain. -She completed palliative radiation to the pelvis, left scapula, T4-6, and T10-12 on 3/3-3/16/2022 -She began monthly Zometa and oral calcium on 08/03/2020 -Pain significantly improved after RT, she has not required pain medication recently   3. 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 -Hemoglobin electrophoresis showed increase in Hg C, consistent with Hg C trait.  She is aware her children are at risk of hemoglobin C trait, they have been encouraged to be tested. -Anemia worsened recently with MM -Continue oral iron   4. HTN, OA, Obesity  -On chlorthalidone for  HTN -She will continue to f/u with PCP, Chiropractor and Ortho.    5. Anxiety, depression -under care of [place] provider, tolerating Zoloft -She is overwhelmed with the diagnosis and information but otherwise stable. -appears improved   Disposition:  Ms. Melichar appears stable. She continues RVd, with weekly velcade and dex, and Revlimid 2 weeks on/1 week off (for cytopenias). She is on her week off Revlimid. She tolerates treatment with fatigue, altered taste, and nausea. She is meeting with dietician today. Side effects are well managed with supportive care at home. She is able to recover and function well.   Labs reviewed, stable anemia, platelet 102. CBC and CMP adequate to continue RVD. We reviewed thrombocytopenic precautions. Last myeloma panel shows she is responding well to induction, labs pending from today.  She is scheduled for transplant eval at [facility] on 6/29, continue RVD in the interim.  Follow-up in 2 weeks.  All questions were answered. The patient knows to call the clinic with any problems, questions or concerns. No barriers to learning was detected.     Alla Feeling, NP 11/09/20

## 2020-11-09 ENCOUNTER — Inpatient Hospital Stay: Payer: BC Managed Care – PPO

## 2020-11-09 ENCOUNTER — Inpatient Hospital Stay: Payer: BC Managed Care – PPO | Admitting: Nutrition

## 2020-11-09 ENCOUNTER — Encounter: Payer: Self-pay | Admitting: Nurse Practitioner

## 2020-11-09 ENCOUNTER — Other Ambulatory Visit: Payer: Self-pay

## 2020-11-09 ENCOUNTER — Inpatient Hospital Stay (HOSPITAL_BASED_OUTPATIENT_CLINIC_OR_DEPARTMENT_OTHER): Payer: BC Managed Care – PPO | Admitting: Nurse Practitioner

## 2020-11-09 VITALS — BP 116/84 | HR 91 | Temp 98.2°F | Resp 18 | Wt 178.5 lb

## 2020-11-09 DIAGNOSIS — R112 Nausea with vomiting, unspecified: Secondary | ICD-10-CM

## 2020-11-09 DIAGNOSIS — C9000 Multiple myeloma not having achieved remission: Secondary | ICD-10-CM | POA: Diagnosis not present

## 2020-11-09 DIAGNOSIS — I10 Essential (primary) hypertension: Secondary | ICD-10-CM | POA: Diagnosis not present

## 2020-11-09 DIAGNOSIS — E669 Obesity, unspecified: Secondary | ICD-10-CM | POA: Diagnosis not present

## 2020-11-09 DIAGNOSIS — F419 Anxiety disorder, unspecified: Secondary | ICD-10-CM | POA: Diagnosis not present

## 2020-11-09 DIAGNOSIS — M1990 Unspecified osteoarthritis, unspecified site: Secondary | ICD-10-CM | POA: Diagnosis not present

## 2020-11-09 DIAGNOSIS — F32A Depression, unspecified: Secondary | ICD-10-CM | POA: Diagnosis not present

## 2020-11-09 DIAGNOSIS — Z5112 Encounter for antineoplastic immunotherapy: Secondary | ICD-10-CM | POA: Diagnosis not present

## 2020-11-09 DIAGNOSIS — D509 Iron deficiency anemia, unspecified: Secondary | ICD-10-CM | POA: Diagnosis not present

## 2020-11-09 LAB — CBC WITH DIFFERENTIAL (CANCER CENTER ONLY)
Abs Immature Granulocytes: 0.06 10*3/uL (ref 0.00–0.07)
Basophils Absolute: 0 10*3/uL (ref 0.0–0.1)
Basophils Relative: 0 %
Eosinophils Absolute: 0.1 10*3/uL (ref 0.0–0.5)
Eosinophils Relative: 2 %
HCT: 30.6 % — ABNORMAL LOW (ref 36.0–46.0)
Hemoglobin: 10.3 g/dL — ABNORMAL LOW (ref 12.0–15.0)
Immature Granulocytes: 1 %
Lymphocytes Relative: 15 %
Lymphs Abs: 0.7 10*3/uL (ref 0.7–4.0)
MCH: 25 pg — ABNORMAL LOW (ref 26.0–34.0)
MCHC: 33.7 g/dL (ref 30.0–36.0)
MCV: 74.3 fL — ABNORMAL LOW (ref 80.0–100.0)
Monocytes Absolute: 0.5 10*3/uL (ref 0.1–1.0)
Monocytes Relative: 10 %
Neutro Abs: 3.4 10*3/uL (ref 1.7–7.7)
Neutrophils Relative %: 72 %
Platelet Count: 102 10*3/uL — ABNORMAL LOW (ref 150–400)
RBC: 4.12 MIL/uL (ref 3.87–5.11)
RDW: 19.4 % — ABNORMAL HIGH (ref 11.5–15.5)
WBC Count: 4.7 10*3/uL (ref 4.0–10.5)
nRBC: 0 % (ref 0.0–0.2)

## 2020-11-09 LAB — CMP (CANCER CENTER ONLY)
ALT: 32 U/L (ref 0–44)
AST: 22 U/L (ref 15–41)
Albumin: 3.5 g/dL (ref 3.5–5.0)
Alkaline Phosphatase: 75 U/L (ref 38–126)
Anion gap: 12 (ref 5–15)
BUN: 13 mg/dL (ref 6–20)
CO2: 26 mmol/L (ref 22–32)
Calcium: 8.5 mg/dL — ABNORMAL LOW (ref 8.9–10.3)
Chloride: 103 mmol/L (ref 98–111)
Creatinine: 0.62 mg/dL (ref 0.44–1.00)
GFR, Estimated: >60 mL/min (ref >60)
Glucose, Bld: 78 mg/dL (ref 70–99)
Potassium: 3.5 mmol/L (ref 3.5–5.1)
Sodium: 141 mmol/L (ref 135–145)
Total Bilirubin: 1.4 mg/dL — ABNORMAL HIGH (ref 0.3–1.2)
Total Protein: 5.9 g/dL — ABNORMAL LOW (ref 6.5–8.1)

## 2020-11-09 MED ORDER — ONDANSETRON HCL 8 MG PO TABS: 8.0000 mg | ORAL_TABLET | Freq: Three times a day (TID) | ORAL | 3 refills | Status: DC | PRN

## 2020-11-09 MED ORDER — BORTEZOMIB CHEMO SQ INJECTION 3.5 MG (2.5MG/ML)
1.3000 mg/m2 | Freq: Once | INTRAMUSCULAR | Status: AC
  Administered 2020-11-09: 2.75 mg via SUBCUTANEOUS
  Filled 2020-11-09: qty 1.1

## 2020-11-09 MED ORDER — DEXAMETHASONE 4 MG PO TABS
20.0000 mg | ORAL_TABLET | Freq: Once | ORAL | Status: AC
  Administered 2020-11-09: 20 mg via ORAL

## 2020-11-09 NOTE — Progress Notes (Signed)
Nutrition Follow-Up completed with patient during Infusion for Multiple Myeloma.  Weight decreased to 178 pounds from 190.9 pounds May 2 and 200 pounds April 11.  Patient reports she continues to be unable to taste most foods.  She does tolerate watermelon and other cold fruits and has been enjoying these.  She is struggling with finding protein foods that she can tolerate.  Complains of food smelling awful, contributing to decreased p.o. intake.  She is tolerating some nutrition supplements such as Premier protein or Ensure max.  She prefers these very cold or over ice.  Nutrition diagnosis: Unintended weight loss continues.  Intervention: Encourage patient to try nausea medications to see if it improves oral intake with smell aversion. Recommended Ensure complete 3 times daily between meals and provided coupons. (350 cal, 30 g protein per carton) Encouraged cold protein foods to increase intake.  Suggested smaller amounts of food at mealtimes so as not to overwhelm her.  Monitoring, evaluation, goals: Patient will tolerate adequate calories and protein to minimize further weight loss.  Next visit: To be scheduled as needed.  **Disclaimer: This note was dictated with voice recognition software. Similar sounding words can inadvertently be transcribed and this note may contain transcription errors which may not have been corrected upon publication of note.**

## 2020-11-10 ENCOUNTER — Telehealth: Payer: Self-pay | Admitting: Hematology

## 2020-11-10 LAB — KAPPA/LAMBDA LIGHT CHAINS
Kappa free light chain: 11.4 mg/L (ref 3.3–19.4)
Kappa, lambda light chain ratio: 1.81 — ABNORMAL HIGH (ref 0.26–1.65)
Lambda free light chains: 6.3 mg/L (ref 5.7–26.3)

## 2020-11-10 NOTE — Telephone Encounter (Signed)
Scheduled follow-up appointments per 6/13 los. Patient is aware.

## 2020-11-11 LAB — MULTIPLE MYELOMA PANEL, SERUM
Albumin SerPl Elph-Mcnc: 3.4 g/dL (ref 2.9–4.4)
Albumin/Glob SerPl: 1.7 (ref 0.7–1.7)
Alpha 1: 0.2 g/dL (ref 0.0–0.4)
Alpha2 Glob SerPl Elph-Mcnc: 0.6 g/dL (ref 0.4–1.0)
B-Globulin SerPl Elph-Mcnc: 0.8 g/dL (ref 0.7–1.3)
Gamma Glob SerPl Elph-Mcnc: 0.4 g/dL (ref 0.4–1.8)
Globulin, Total: 2.1 g/dL — ABNORMAL LOW (ref 2.2–3.9)
IgA: 53 mg/dL — ABNORMAL LOW (ref 87–352)
IgG (Immunoglobin G), Serum: 444 mg/dL — ABNORMAL LOW (ref 586–1602)
IgM (Immunoglobulin M), Srm: 10 mg/dL — ABNORMAL LOW (ref 26–217)
Total Protein ELP: 5.5 g/dL — ABNORMAL LOW (ref 6.0–8.5)

## 2020-11-16 ENCOUNTER — Inpatient Hospital Stay: Payer: BC Managed Care – PPO

## 2020-11-16 ENCOUNTER — Other Ambulatory Visit: Payer: Self-pay

## 2020-11-16 VITALS — BP 98/65 | HR 85 | Temp 98.6°F | Resp 18 | Wt 174.5 lb

## 2020-11-16 DIAGNOSIS — I10 Essential (primary) hypertension: Secondary | ICD-10-CM | POA: Diagnosis not present

## 2020-11-16 DIAGNOSIS — C9000 Multiple myeloma not having achieved remission: Secondary | ICD-10-CM | POA: Diagnosis not present

## 2020-11-16 DIAGNOSIS — Z5112 Encounter for antineoplastic immunotherapy: Secondary | ICD-10-CM | POA: Diagnosis not present

## 2020-11-16 DIAGNOSIS — D509 Iron deficiency anemia, unspecified: Secondary | ICD-10-CM | POA: Diagnosis not present

## 2020-11-16 DIAGNOSIS — F32A Depression, unspecified: Secondary | ICD-10-CM | POA: Diagnosis not present

## 2020-11-16 DIAGNOSIS — M1990 Unspecified osteoarthritis, unspecified site: Secondary | ICD-10-CM | POA: Diagnosis not present

## 2020-11-16 DIAGNOSIS — E669 Obesity, unspecified: Secondary | ICD-10-CM | POA: Diagnosis not present

## 2020-11-16 DIAGNOSIS — F419 Anxiety disorder, unspecified: Secondary | ICD-10-CM | POA: Diagnosis not present

## 2020-11-16 LAB — CMP (CANCER CENTER ONLY)
ALT: 27 U/L (ref 0–44)
AST: 21 U/L (ref 15–41)
Albumin: 3.7 g/dL (ref 3.5–5.0)
Alkaline Phosphatase: 69 U/L (ref 38–126)
Anion gap: 6 (ref 5–15)
BUN: 14 mg/dL (ref 6–20)
CO2: 28 mmol/L (ref 22–32)
Calcium: 7.9 mg/dL — ABNORMAL LOW (ref 8.9–10.3)
Chloride: 103 mmol/L (ref 98–111)
Creatinine: 0.64 mg/dL (ref 0.44–1.00)
GFR, Estimated: >60 mL/min (ref >60)
Glucose, Bld: 89 mg/dL (ref 70–99)
Potassium: 3.2 mmol/L — ABNORMAL LOW (ref 3.5–5.1)
Sodium: 137 mmol/L (ref 135–145)
Total Bilirubin: 1 mg/dL (ref 0.3–1.2)
Total Protein: 5.8 g/dL — ABNORMAL LOW (ref 6.5–8.1)

## 2020-11-16 LAB — CBC WITH DIFFERENTIAL (CANCER CENTER ONLY)
Abs Immature Granulocytes: 0.04 10*3/uL (ref 0.00–0.07)
Basophils Absolute: 0 10*3/uL (ref 0.0–0.1)
Basophils Relative: 0 %
Eosinophils Absolute: 0.1 10*3/uL (ref 0.0–0.5)
Eosinophils Relative: 2 %
HCT: 29.6 % — ABNORMAL LOW (ref 36.0–46.0)
Hemoglobin: 10.1 g/dL — ABNORMAL LOW (ref 12.0–15.0)
Immature Granulocytes: 1 %
Lymphocytes Relative: 10 %
Lymphs Abs: 0.5 10*3/uL — ABNORMAL LOW (ref 0.7–4.0)
MCH: 25.8 pg — ABNORMAL LOW (ref 26.0–34.0)
MCHC: 34.1 g/dL (ref 30.0–36.0)
MCV: 75.5 fL — ABNORMAL LOW (ref 80.0–100.0)
Monocytes Absolute: 0.7 10*3/uL (ref 0.1–1.0)
Monocytes Relative: 14 %
Neutro Abs: 3.3 10*3/uL (ref 1.7–7.7)
Neutrophils Relative %: 73 %
Platelet Count: 168 10*3/uL (ref 150–400)
RBC: 3.92 MIL/uL (ref 3.87–5.11)
RDW: 19.9 % — ABNORMAL HIGH (ref 11.5–15.5)
WBC Count: 4.6 10*3/uL (ref 4.0–10.5)
nRBC: 0 % (ref 0.0–0.2)

## 2020-11-16 MED ORDER — BORTEZOMIB CHEMO SQ INJECTION 3.5 MG (2.5MG/ML)
1.3000 mg/m2 | Freq: Once | INTRAMUSCULAR | Status: AC
  Administered 2020-11-16: 2.75 mg via SUBCUTANEOUS
  Filled 2020-11-16: qty 1.1

## 2020-11-16 MED ORDER — DEXAMETHASONE 4 MG PO TABS
20.0000 mg | ORAL_TABLET | Freq: Once | ORAL | Status: AC
  Administered 2020-11-16: 20 mg via ORAL

## 2020-11-16 MED ORDER — DEXAMETHASONE 4 MG PO TABS
ORAL_TABLET | ORAL | Status: AC
  Filled 2020-11-16: qty 5

## 2020-11-16 NOTE — Patient Instructions (Signed)
[facility]  Discharge Instructions: Thank you for choosing [facility] to provide your oncology and hematology care.   If you have a lab appointment with the Cancer Center, please go directly to the Cancer Center and check in at the registration area.   Wear comfortable clothing and clothing appropriate for easy access to any Portacath or PICC line.   We strive to give you quality time with your provider. You may need to reschedule your appointment if you arrive late (15 or more minutes).  Arriving late affects you and other patients whose appointments are after yours.  Also, if you miss three or more appointments without notifying the office, you may be dismissed from the clinic at the provider's discretion.      For prescription refill requests, have your pharmacy contact our office and allow 72 hours for refills to be completed.    Today you received the following chemotherapy and/or immunotherapy agents Velcade       To help prevent nausea and vomiting after your treatment, we encourage you to take your nausea medication as directed.  BELOW ARE SYMPTOMS THAT SHOULD BE REPORTED IMMEDIATELY: *FEVER GREATER THAN 100.4 F (38 C) OR HIGHER *CHILLS OR SWEATING *NAUSEA AND VOMITING THAT IS NOT CONTROLLED WITH YOUR NAUSEA MEDICATION *UNUSUAL SHORTNESS OF BREATH *UNUSUAL BRUISING OR BLEEDING *URINARY PROBLEMS (pain or burning when urinating, or frequent urination) *BOWEL PROBLEMS (unusual diarrhea, constipation, pain near the anus) TENDERNESS IN MOUTH AND THROAT WITH OR WITHOUT PRESENCE OF ULCERS (sore throat, sores in mouth, or a toothache) UNUSUAL RASH, SWELLING OR PAIN  UNUSUAL VAGINAL DISCHARGE OR ITCHING   Items with * indicate a potential emergency and should be followed up as soon as possible or go to the Emergency Department if any problems should occur.  Please show the CHEMOTHERAPY ALERT CARD or IMMUNOTHERAPY ALERT CARD at check-in to  the Emergency Department and triage nurse.  Should you have questions after your visit or need to cancel or reschedule your appointment, please contact [facility]  Dept: [phone]  and follow the prompts.  Office hours are 8:00 a.m. to 4:30 p.m. Monday - Friday. Please note that voicemails left after 4:00 p.m. may not be returned until the following business day.  We are closed weekends and major holidays. You have access to a nurse at all times for urgent questions. Please call the main number to the clinic Dept: [phone] and follow the prompts.   For any non-urgent questions, you may also contact your provider using MyChart. We now offer e-Visits for anyone 18 and older to request care online for non-urgent symptoms. For details visit [email or web address].   Also download the MyChart app! Go to the app store, search "MyChart", open the app, select [facility], and log in with your MyChart username and password.  Due to Covid, a mask is required upon entering the hospital/clinic. If you do not have a mask, one will be given to you upon arrival. For doctor visits, patients may have 1 support person aged 18 or older with them. For treatment visits, patients cannot have anyone with them due to current Covid guidelines and our immunocompromised population.   

## 2020-11-17 DIAGNOSIS — C9000 Multiple myeloma not having achieved remission: Secondary | ICD-10-CM | POA: Diagnosis not present

## 2020-11-18 NOTE — Progress Notes (Signed)
Hannah [name]   [phone] [phone]   Clinic Follow up Note   Patient Care Team: Tisovec, Fransico Him, MD as PCP - General (Internal Medicine) Bo Merino, MD as Consulting Physician (Rheumatology)  Date of Service:  11/23/2020  CHIEF COMPLAINT: f/u of multiple myeloma  SUMMARY OF ONCOLOGIC HISTORY: Oncology History Overview Note  Cancer Staging Multiple myeloma ([place]) Staging form: Plasma Cell Myeloma and Plasma Cell Disorders, AJCC 8th Edition - Clinical stage from 07/20/2020: Beta-2-microglobulin (mg/L): 2.4, Albumin (g/dL): 3.2, ISS: Stage II, High-risk cytogenetics: Unknown, LDH: Normal - Signed by Truitt Merle, MD on 08/02/2020 Beta 2 microglobulin range (mg/L): Less than 3.5 Albumin range (g/dL): Less than 3.5 Cytogenetics: Unknown    Multiple myeloma ([place])  07/03/2020 Imaging   MRI Lumbar Spine  IMPRESSION: 1. Numerous T1 hypointense and STIR hyperintense lesions throughout the visualized spine and sacrum with multiple areas of extraosseous extension in the sacrum, detailed above and compatible with multiple myeloma given the clinical history. 2. An MRI of the pelvis with contrast could evaluate the full extent of the partially imaged sacral lesions, including left S1-S2 neural foraminal involvement and suspected involvement of the exited left L5 nerve. 3. Multilevel degenerative change without significant canal or foraminal stenosis in the lumbar spine.   07/13/2020 Initial Diagnosis   Multiple myeloma ([place])    07/20/2020 Cancer Staging   Staging form: Plasma Cell Myeloma and Plasma Cell Disorders, AJCC 8th Edition - Clinical stage from 07/20/2020: Beta-2-microglobulin (mg/L): 2.4, Albumin (g/dL): 3.2, ISS: Stage II, High-risk cytogenetics: Unknown, LDH: Normal - Signed by Truitt Merle, MD on 08/31/2020  Beta 2 microglobulin range (mg/L): Less than 3.5  Albumin range (g/dL): Less than 3.5  Cytogenetics: No abnormalities  Bone  disease on imaging: Present    07/24/2020 PET scan   IMPRESSION: 1. Moderate to high hypermetabolic activity associated with the expansile soft tissue masses within the LEFT and RIGHT pelvic bones is consistent with active multiple myeloma / plasmacytoma. 2. Lytic lesion in the LEFT scapula with mild metabolic activity is indeterminate. 3. Metabolic activity associated with the RIGHT knee prosthetic and RIGHT distal foot fusion is favored post procedural inflammation.     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  The findings are most consistent with  persistent/recurrent/previously known plasma cell neoplasm.  For  completeness, immunohistochemical stain for CD138 and in situ  hybridization for kappa and lambda will be performed and the results  reported in an addendum.  Correlation with cytogenetic and FISH studies  is recommended.   08/03/2020 -  Chemotherapy   Zometa q4weeks starting 08/03/20    08/17/2020 - 11/29/2020 Chemotherapy   Velcade weekly, Revlimid, dex $RemoveBefor'20mg'CzlApqLfwEep$  weekly (VRd)  Starting 08/17/20. Last dose Velcade 11/23/20 and last dose revlimid on 11/29/20       CURRENT THERAPY:  Zometa q4weeks starting 08/03/20 Velcade, Revlimid, dex (VRd) 2 weeks on/1 week off starting 08/17/20. Last dose Velcade 11/23/20 and last dose revlimid on 11/29/20  INTERVAL HISTORY:  Hannah [name] is here for a follow up of MM. She was last seen by me 1 month ago. She  presents to the clinic alone. She notes she plans to have work up for her bone marrow transplant. She does not have date of actual transplant yet or when she is to stop systemic treatment. She notes she is tolerating treatment well. She is on week 2 day 8 of Revlimid. She notes with change in when she takes Revlimid and with antiemetics her nausea is better. She notes soreness in her right hip in the past 2 days. This has not limited what she does. She no longer has her prior back pain. She is rarely needing pain medication. She notes her neuropathy is manageable and stable.     REVIEW OF SYSTEMS:   Constitutional: Denies fevers, chills or abnormal weight loss Eyes: Denies blurriness of vision Ears, nose, mouth, throat, and face: Denies mucositis or sore throat Respiratory: Denies cough, dyspnea or wheezes Cardiovascular: Denies palpitation, chest discomfort or lower extremity swelling Gastrointestinal:  Denies nausea, heartburn or change in bowel habits Skin: Denies abnormal skin rashes  MSK: (+) Right hip soreness  Lymphatics: Denies new lymphadenopathy or easy bruising Neurological:Denies numbness, tingling or new weaknesses Behavioral/Psych: Mood is stable, no new changes  All other systems were reviewed with the patient and are negative.  MEDICAL HISTORY:  Past Medical History:  Diagnosis Date   Allergy    Anemia    Anxiety    Arthritis    Asthma    Depression    Hypertension     SURGICAL HISTORY: Past Surgical History:  Procedure Laterality Date   CHOLECYSTECTOMY  1993   COLONOSCOPY     TOTAL KNEE ARTHROPLASTY Right 01/28/2016   TOTAL KNEE ARTHROPLASTY Right 01/28/2016   Procedure: RIGHT TOTAL KNEE ARTHROPLASTY;  Surgeon: [name], MD;  Location: [facility];  Service: Orthopedics;  Laterality: Right;   TRIGGER FINGER RELEASE Right 06/18/2014   Procedure: RIGHT LONG FINGER TRIGGER RELEASE;  Surgeon: [name], MD;  Location: [facility];  Service:  Orthopedics;  Laterality: Right;   TUBAL LIGATION     VAGINAL HYSTERECTOMY Bilateral 11/04/2016   Procedure: HYSTERECTOMY VAGINAL with Bilateral Salpingectomy;  Surgeon: [name], MD;  Location: [facility];  Service: Gynecology;  Laterality: Bilateral;   WEIL OSTEOTOMY Right 07/07/2020   Procedure: RIGHT FOOT WEIL OSTEOTOMY 2, 3, AND 4 METATARSALS AND PROXIMAL INTERPHALANGEAL JOINT FUSION 2 & 4 TOES;  Surgeon: Newt Minion, MD;  Location: [facility];  Service: Orthopedics;  Laterality: Right;    I have reviewed the social history and family history with the patient and they are unchanged from previous note.  ALLERGIES:  is allergic to elemental sulfur and sulfa antibiotics.  MEDICATIONS:  Current Outpatient Medications  Medication Sig Dispense Refill   calcium carbonate (OSCAL) 1500 (600 Ca) MG TABS tablet Take 600 mg of elemental calcium by mouth daily.     chlorthalidone (HYGROTON) 25 MG tablet chlorthalidone 25 mg tablet     Cholecalciferol (VITAMIN D3) 1.25 MG (50000 UT) CAPS Take 1 capsule by mouth once a week.     dexamethasone (DECADRON) 4 MG tablet Take by mouth.     ferrous sulfate 325 (65 FE) MG tablet Take 325 mg by mouth 2 (two) times daily with a meal.     KLOR-CON M20 20 MEQ tablet TAKE 1 TABLET BY MOUTH TWICE A DAY 180 tablet 1   lenalidomide (REVLIMID)  25 MG capsule TAKE 1 CAPSULE BY MOUTH ONCE DAILY FOR 14 DAYS ON AND 7 DAYS OFF 14 capsule 0   naproxen sodium (ALEVE) 220 MG tablet 2 (two) times daily.     ondansetron (ZOFRAN) 8 MG tablet Take 1 tablet (8 mg total) by mouth every 8 (eight) hours as needed for nausea or vomiting. 20 tablet 3   pantoprazole (PROTONIX) 20 MG tablet TAKE 1 TABLET BY MOUTH EVERY DAY 30 tablet 2   prochlorperazine (COMPAZINE) 10 MG tablet Take 1 tablet (10 mg total) by mouth every 6 (six) hours as needed for nausea or vomiting. 30 tablet 3   sertraline (ZOLOFT) 50 MG tablet sertraline 50 mg tablet     valACYclovir (VALTREX) 500  MG tablet Take 500 mg by mouth 2 (two) times daily.     vitamin C (ASCORBIC ACID) 500 MG tablet      No current facility-administered medications for this visit.    PHYSICAL EXAMINATION: ECOG PERFORMANCE STATUS: 1 - Symptomatic but completely ambulatory  Vitals:   11/23/20 0912  BP: 110/75  Pulse: 75  Resp: 17  Temp: 97.8 F (36.6 C)  SpO2: 100%   Filed Weights   11/23/20 0912  Weight: 178 lb 6.4 oz (80.9 kg)     Due to COVID19 we will limit examination to appearance. Patient had no complaints.  GENERAL:alert, no distress and comfortable SKIN: skin color normal, no rashes or significant lesions EYES: normal, Conjunctiva are pink and non-injected, sclera clear  NEURO: alert & oriented x 3 with fluent speech   LABORATORY DATA:  I have reviewed the data as listed CBC Latest Ref Rng & Units 11/23/2020 11/16/2020 11/09/2020  WBC 4.0 - 10.5 K/uL 4.1 4.6 4.7  Hemoglobin 12.0 - 15.0 g/dL 10.2(L) 10.1(L) 10.3(L)  Hematocrit 36.0 - 46.0 % 29.5(L) 29.6(L) 30.6(L)  Platelets 150 - 400 K/uL 123(L) 168 102(L)     CMP Latest Ref Rng & Units 11/23/2020 11/16/2020 11/09/2020  Glucose 70 - 99 mg/dL 92 89 78  BUN 6 - 20 mg/dL 8 14 13   Creatinine 0.44 - 1.00 mg/dL 0.73 0.64 0.62  Sodium 135 - 145 mmol/L 143 137 141  Potassium 3.5 - 5.1 mmol/L 3.7 3.2(L) 3.5  Chloride 98 - 111 mmol/L 107 103 103  CO2 22 - 32 mmol/L 27 28 26   Calcium 8.9 - 10.3 mg/dL 8.5(L) 7.9(L) 8.5(L)  Total Protein 6.5 - 8.1 g/dL 5.5(L) 5.8(L) 5.9(L)  Total Bilirubin 0.3 - 1.2 mg/dL 0.9 1.0 1.4(H)  Alkaline Phos 38 - 126 U/L 74 69 75  AST 15 - 41 U/L 17 21 22   ALT 0 - 44 U/L 18 27 32      RADIOGRAPHIC STUDIES: I have personally reviewed the radiological images as listed and agreed with the findings in the report. No results found.   ASSESSMENT & PLAN:  Hannah [name] is a 60 y.o. female with   1. 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 I previously discussed with pathologist Dr. Gari Crown, the increased plasma cells in bone marrow is likely between 10 to 20%, and would favor smoldering multiple myeloma than MGUS. She initially did not meet CRAB Criteria for MM. -After increase of IgA, light chain and M-protein (3.3) on 06/01/20 labs, multiple T1 lesions in her lumbar spine and sacrum on  07/03/20 MRI, hypermetabolic uptake in multiple bone lesion on 07/24/20 PET, she meets the diagnostic criteria for multiple myeloma -She underwent a bone marrow biopsy on 07/30/20, which showed 31% plasma cells. Cytology and FISH was negative gene mutations, which indicating standard risk disease. -She has completed palliative radiation to sacral bone lesions in 07/2020.  -She has started induction chemotherapy on 08/17/20 with VRd (Velcade, Revlimid 2 weeks on, and 1 week off and dexamethasone). She is a candidate for bone marrow transplant.   -Her 09/14/20 MM panel showed her M-protein decreased to 1.2, IgA and light chains have significantly decreased, indicating excellent response to treatment. -She is tolerating treatment well except fatigue, taste change and nausea which are likely from Revlimid. The tingling in her fingers is only intermittent..  We discussed the option of changing Revlimid to Pomalidomide, she prefers to continue Revlimid for now. -She plans to proceed with bone marrow transplant at [facility] this summer. Date is not set, but she will be getting work up this week. She is overall up to date on vaccinations except Evusheld. I recommend she proceed with this before her transplant.  -Labs today reviewed, HG 10.2, plt 123K. Based on 11/09/20 labs, her M-protein is not observed and her light chains have normalized. I discussed she has reached near complete response to systemic treatment, and will proceed with bone marrow  transplant next and followed by maintenance therapy   -Will proceed with Velcade and Zometa today. She will continue Revlimid, she is currently on day 8 of current cycle. After this cycle she will stop treatment to proceed with Bone Marrow transplant.  -f/u open, will see her back after BMT      2. Low back pain, right hip pain, Mid right back pain, secondary to MM -During work up with orthopedic surgeon, she was found to have multiple T1 lesions of her spine and sacrum compatible with MM on her 07/03/20 MRI lumbar spine. These were hypermetabolic on 4/36/06 PET.  -She has completed her course of palliative radiation in 07/2020. She no longer has significant back pain. Her use of Norco as needed is rare now.  -She continues with physical therapy, as tolerated. Best to be done on her off week.  -She started Zometa on 08/03/20 to help strengthen her bones. We will continue monthly. She will continue calcium and vitamin D supplement  -She notes recent right hip soreness with his mild the past 2 days. Will monitor.      3. Nausea, Diarrhea, Low appetite, hypokalemia  -Nausea is controlled with antiemetics q8hours. Will continue.  -On oral potassium TID -She notes due to taste change she has low appetite and not eating much. She is taking Ensure TID and tries to eat more of tolerable foods. She will continue to f/u dietician. -She notes having 3 BM daily with diarrhea. I discussed if she has more than 3 a day, she can use imodium.  -She has been able to gain weight recently.    4. 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 -Hemoglobin electrophoresis showed increase in Hg C, consistent with Hg C trait. I discussed the results with her and informed her that her children are at risk of hemoglobin C trait, and I encouraged him to be tested. -Anemia overall mild and stable. Continue oral iron BID.    5. HTN, OA, Obesity, Anxiety,  depression, Hypokalemia  -She will continue to f/u with PCP, Chiropractor and Ortho.  -  For Anxiety and depression she is under care of [facility] provider. Mood stable on Zoloft -We have held 1 of her HTN medications due to low potassium. She will continue oral Potassium 3 tabs daily. She is fine to crush pill if needed.      PLAN:  -Labs reviewed, will proceed with weekly dexamethasone 20 mg and Velcade injection today, which will be her last dose before her bone marrow transplant (will confirm with [facility]) -Continue Revlimid 97m 2 weeks on, 1 week off. Currently on day 8 today of current cycle, she will complete this cycle on Sunday 7/3   -Proceed with Zometa today  -F/u open, after her BMT    No problem-specific Assessment & Plan notes found for this encounter.   Orders Placed This Encounter  Procedures   Ambulatory Referral for Evusheld    Referral Priority:   Urgent    Referral Type:   Consultation    Referral Reason:   Specialty Services Required    Number of Visits Requested:   1    All questions were answered. The patient knows to call the clinic with any problems, questions or concerns. No barriers to learning was detected. The total time spent in the appointment was 30 minutes.     YTruitt Merle MD 11/23/2020   I, AJoslyn Devon am acting as scribe for YTruitt Merle MD.   I have reviewed the above documentation for accuracy and completeness, and I agree with the above.

## 2020-11-23 ENCOUNTER — Inpatient Hospital Stay: Payer: BC Managed Care – PPO

## 2020-11-23 ENCOUNTER — Inpatient Hospital Stay (HOSPITAL_BASED_OUTPATIENT_CLINIC_OR_DEPARTMENT_OTHER): Payer: BC Managed Care – PPO | Admitting: Hematology

## 2020-11-23 ENCOUNTER — Encounter: Payer: Self-pay | Admitting: Hematology

## 2020-11-23 ENCOUNTER — Other Ambulatory Visit: Payer: Self-pay

## 2020-11-23 VITALS — BP 110/75 | HR 75 | Temp 97.8°F | Resp 17 | Wt 178.4 lb

## 2020-11-23 DIAGNOSIS — F32A Depression, unspecified: Secondary | ICD-10-CM | POA: Diagnosis not present

## 2020-11-23 DIAGNOSIS — F419 Anxiety disorder, unspecified: Secondary | ICD-10-CM | POA: Diagnosis not present

## 2020-11-23 DIAGNOSIS — C9000 Multiple myeloma not having achieved remission: Secondary | ICD-10-CM | POA: Diagnosis not present

## 2020-11-23 DIAGNOSIS — D509 Iron deficiency anemia, unspecified: Secondary | ICD-10-CM | POA: Diagnosis not present

## 2020-11-23 DIAGNOSIS — M1990 Unspecified osteoarthritis, unspecified site: Secondary | ICD-10-CM | POA: Diagnosis not present

## 2020-11-23 DIAGNOSIS — E669 Obesity, unspecified: Secondary | ICD-10-CM | POA: Diagnosis not present

## 2020-11-23 DIAGNOSIS — I10 Essential (primary) hypertension: Secondary | ICD-10-CM | POA: Diagnosis not present

## 2020-11-23 DIAGNOSIS — Z5112 Encounter for antineoplastic immunotherapy: Secondary | ICD-10-CM | POA: Diagnosis not present

## 2020-11-23 LAB — CMP (CANCER CENTER ONLY)
ALT: 18 U/L (ref 0–44)
AST: 17 U/L (ref 15–41)
Albumin: 3.3 g/dL — ABNORMAL LOW (ref 3.5–5.0)
Alkaline Phosphatase: 74 U/L (ref 38–126)
Anion gap: 9 (ref 5–15)
BUN: 8 mg/dL (ref 6–20)
CO2: 27 mmol/L (ref 22–32)
Calcium: 8.5 mg/dL — ABNORMAL LOW (ref 8.9–10.3)
Chloride: 107 mmol/L (ref 98–111)
Creatinine: 0.73 mg/dL (ref 0.44–1.00)
GFR, Estimated: >60 mL/min (ref >60)
Glucose, Bld: 92 mg/dL (ref 70–99)
Potassium: 3.7 mmol/L (ref 3.5–5.1)
Sodium: 143 mmol/L (ref 135–145)
Total Bilirubin: 0.9 mg/dL (ref 0.3–1.2)
Total Protein: 5.5 g/dL — ABNORMAL LOW (ref 6.5–8.1)

## 2020-11-23 LAB — CBC WITH DIFFERENTIAL (CANCER CENTER ONLY)
Abs Immature Granulocytes: 0.18 10*3/uL — ABNORMAL HIGH (ref 0.00–0.07)
Basophils Absolute: 0 10*3/uL (ref 0.0–0.1)
Basophils Relative: 1 %
Eosinophils Absolute: 0.2 10*3/uL (ref 0.0–0.5)
Eosinophils Relative: 4 %
HCT: 29.5 % — ABNORMAL LOW (ref 36.0–46.0)
Hemoglobin: 10.2 g/dL — ABNORMAL LOW (ref 12.0–15.0)
Immature Granulocytes: 4 %
Lymphocytes Relative: 14 %
Lymphs Abs: 0.6 10*3/uL — ABNORMAL LOW (ref 0.7–4.0)
MCH: 26.1 pg (ref 26.0–34.0)
MCHC: 34.6 g/dL (ref 30.0–36.0)
MCV: 75.4 fL — ABNORMAL LOW (ref 80.0–100.0)
Monocytes Absolute: 0.2 10*3/uL (ref 0.1–1.0)
Monocytes Relative: 5 %
Neutro Abs: 2.9 10*3/uL (ref 1.7–7.7)
Neutrophils Relative %: 72 %
Platelet Count: 123 10*3/uL — ABNORMAL LOW (ref 150–400)
RBC: 3.91 MIL/uL (ref 3.87–5.11)
RDW: 20.1 % — ABNORMAL HIGH (ref 11.5–15.5)
WBC Count: 4.1 10*3/uL (ref 4.0–10.5)
nRBC: 0 % (ref 0.0–0.2)

## 2020-11-23 MED ORDER — BORTEZOMIB CHEMO SQ INJECTION 3.5 MG (2.5MG/ML)
1.3000 mg/m2 | Freq: Once | INTRAMUSCULAR | Status: AC
  Administered 2020-11-23: 2.75 mg via SUBCUTANEOUS
  Filled 2020-11-23: qty 1.1

## 2020-11-23 MED ORDER — DEXAMETHASONE 4 MG PO TABS
ORAL_TABLET | ORAL | Status: AC
  Filled 2020-11-23: qty 5

## 2020-11-23 MED ORDER — DEXAMETHASONE 4 MG PO TABS
20.0000 mg | ORAL_TABLET | Freq: Once | ORAL | Status: AC
  Administered 2020-11-23: 20 mg via ORAL

## 2020-11-23 NOTE — Patient Instructions (Signed)
[facility]ICAL [facility]COL[facility]  Discharge Instructions: Thank you for choosing [facility] to provide your oncology and hematology care.   If you have a lab appointment with the Cancer Center, please go directly to the Cancer Center and check in at the registration area.   Wear comfortable clothing and clothing appropriate for easy access to any Portacath or PICC line.   We strive to give you quality time with your provider. You may need to reschedule your appointment if you arrive late (15 or more minutes).  Arriving late affects you and other patients whose appointments are after yours.  Also, if you miss three or more appointments without notifying the office, you may be dismissed from the clinic at the provider's discretion.      For prescription refill requests, have your pharmacy contact our office and allow 72 hours for refills to be completed.    Today you received the following chemotherapy and/or immunotherapy agents Velcade       To help prevent nausea and vomiting after your treatment, we encourage you to take your nausea medication as directed.  BELOW ARE SYMPTOMS THAT SHOULD BE REPORTED IMMEDIATELY: *FEVER GREATER THAN 100.4 F (38 C) OR HIGHER *CHILLS OR SWEATING *NAUSEA AND VOMITING THAT IS NOT CONTROLLED WITH YOUR NAUSEA MEDICATION *UNUSUAL SHORTNESS OF BREATH *UNUSUAL BRUISING OR BLEEDING *URINARY PROBLEMS (pain or burning when urinating, or frequent urination) *BOWEL PROBLEMS (unusual diarrhea, constipation, pain near the anus) TENDERNESS IN MOUTH AND THROAT WITH OR WITHOUT PRESENCE OF ULCERS (sore throat, sores in mouth, or a toothache) UNUSUAL RASH, SWELLING OR PAIN  UNUSUAL VAGINAL DISCHARGE OR ITCHING   Items with * indicate a potential emergency and should be followed up as soon as possible or go to the Emergency Department if any problems should occur.  Please show the CHEMOTHERAPY ALERT CARD or IMMUNOTHERAPY ALERT CARD at check-in to  the Emergency Department and triage nurse.  Should you have questions after your visit or need to cancel or reschedule your appointment, please contact [facility]  Dept: [phone]  and follow the prompts.  Office hours are 8:00 a.m. to 4:30 p.m. Monday - Friday. Please note that voicemails left after 4:00 p.m. may not be returned until the following business day.  We are closed weekends and major holidays. You have access to a nurse at all times for urgent questions. Please call the main number to the clinic Dept: [phone] and follow the prompts.   For any non-urgent questions, you may also contact your provider using MyChart. We now offer e-Visits for anyone 18 and older to request care online for non-urgent symptoms. For details visit [email or web address].   Also download the MyChart app! Go to the app store, search "MyChart", open the app, select [facility], and log in with your MyChart username and password.  Due to Covid, a mask is required upon entering the hospital/clinic. If you do not have a mask, one will be given to you upon arrival. For doctor visits, patients may have 1 support person aged 18 or older with them. For treatment visits, patients cannot have anyone with them due to current Covid guidelines and our immunocompromised population.   

## 2020-11-24 ENCOUNTER — Telehealth: Payer: Self-pay | Admitting: Adult Health

## 2020-11-24 NOTE — Telephone Encounter (Signed)
I called patient to discuss Evusheld, a long acting monoclonal antibody injection administered to patients with decreased immune systems or intolerance/allergy to the COVID 19 vaccine as COVID19 prevention.    Unable to reach patient.  LMOM to return my call.  Hannah Godden, NP  

## 2020-11-25 DIAGNOSIS — C9000 Multiple myeloma not having achieved remission: Secondary | ICD-10-CM | POA: Diagnosis not present

## 2020-11-25 DIAGNOSIS — Z01818 Encounter for other preprocedural examination: Secondary | ICD-10-CM | POA: Diagnosis not present

## 2020-11-25 DIAGNOSIS — C9001 Multiple myeloma in remission: Secondary | ICD-10-CM | POA: Diagnosis not present

## 2020-11-26 ENCOUNTER — Telehealth: Payer: Self-pay | Admitting: Adult Health

## 2020-11-26 NOTE — Telephone Encounter (Signed)
Hannah [name] and I spoke about evusheld.  She is planning on calling her team at [facility] to determine whether or not she should proceed with evusheld considering her transplant is in late July.  She will talk to them and will call me back  if she decides to proceed.    Wilber Bihari, NP

## 2020-11-27 ENCOUNTER — Other Ambulatory Visit: Payer: Self-pay | Admitting: Hematology

## 2020-11-27 DIAGNOSIS — C9000 Multiple myeloma not having achieved remission: Secondary | ICD-10-CM

## 2020-12-01 ENCOUNTER — Other Ambulatory Visit: Payer: Self-pay

## 2020-12-01 ENCOUNTER — Telehealth: Payer: Self-pay

## 2020-12-01 ENCOUNTER — Telehealth: Payer: Self-pay | Admitting: Hematology

## 2020-12-01 ENCOUNTER — Inpatient Hospital Stay: Payer: BC Managed Care – PPO

## 2020-12-01 DIAGNOSIS — C9000 Multiple myeloma not having achieved remission: Secondary | ICD-10-CM

## 2020-12-01 MED ORDER — LENALIDOMIDE 25 MG PO CAPS: ORAL_CAPSULE | ORAL | 0 refills | Status: DC

## 2020-12-01 NOTE — Telephone Encounter (Signed)
Cancelled appts per 7/4 sch msg. Pt aware.

## 2020-12-01 NOTE — Telephone Encounter (Signed)
This nurse called and spoke with [place] at [facility] and cancelled refill request for Revlimid.  Patient will not be continuing on Revlimid.  No further questions or concerns at this time.

## 2020-12-04 DIAGNOSIS — Z01818 Encounter for other preprocedural examination: Secondary | ICD-10-CM | POA: Diagnosis not present

## 2020-12-04 DIAGNOSIS — C9000 Multiple myeloma not having achieved remission: Secondary | ICD-10-CM | POA: Diagnosis not present

## 2020-12-07 ENCOUNTER — Ambulatory Visit: Payer: BC Managed Care – PPO

## 2020-12-07 ENCOUNTER — Ambulatory Visit: Payer: BC Managed Care – PPO | Admitting: Nurse Practitioner

## 2020-12-07 ENCOUNTER — Other Ambulatory Visit: Payer: BC Managed Care – PPO

## 2020-12-10 DIAGNOSIS — C9000 Multiple myeloma not having achieved remission: Secondary | ICD-10-CM | POA: Diagnosis not present

## 2020-12-10 DIAGNOSIS — R112 Nausea with vomiting, unspecified: Secondary | ICD-10-CM | POA: Diagnosis not present

## 2020-12-10 DIAGNOSIS — Z01818 Encounter for other preprocedural examination: Secondary | ICD-10-CM | POA: Diagnosis not present

## 2020-12-10 DIAGNOSIS — T451X5A Adverse effect of antineoplastic and immunosuppressive drugs, initial encounter: Secondary | ICD-10-CM | POA: Diagnosis not present

## 2020-12-12 DIAGNOSIS — C9000 Multiple myeloma not having achieved remission: Secondary | ICD-10-CM | POA: Diagnosis not present

## 2020-12-12 DIAGNOSIS — Z52091 Other blood donor, stem cells: Secondary | ICD-10-CM | POA: Diagnosis not present

## 2020-12-13 DIAGNOSIS — C9000 Multiple myeloma not having achieved remission: Secondary | ICD-10-CM | POA: Diagnosis not present

## 2020-12-13 DIAGNOSIS — Z52091 Other blood donor, stem cells: Secondary | ICD-10-CM | POA: Diagnosis not present

## 2020-12-14 DIAGNOSIS — C9000 Multiple myeloma not having achieved remission: Secondary | ICD-10-CM | POA: Diagnosis not present

## 2020-12-14 DIAGNOSIS — Z52011 Autologous donor, stem cells: Secondary | ICD-10-CM | POA: Diagnosis not present

## 2020-12-14 DIAGNOSIS — Z52091 Other blood donor, stem cells: Secondary | ICD-10-CM | POA: Diagnosis not present

## 2020-12-17 DIAGNOSIS — Z52091 Other blood donor, stem cells: Secondary | ICD-10-CM | POA: Diagnosis not present

## 2020-12-17 DIAGNOSIS — C9000 Multiple myeloma not having achieved remission: Secondary | ICD-10-CM | POA: Diagnosis not present

## 2020-12-22 DIAGNOSIS — C9000 Multiple myeloma not having achieved remission: Secondary | ICD-10-CM | POA: Diagnosis not present

## 2020-12-22 DIAGNOSIS — Z5111 Encounter for antineoplastic chemotherapy: Secondary | ICD-10-CM | POA: Diagnosis not present

## 2020-12-22 DIAGNOSIS — D61810 Antineoplastic chemotherapy induced pancytopenia: Secondary | ICD-10-CM | POA: Diagnosis not present

## 2020-12-22 DIAGNOSIS — R112 Nausea with vomiting, unspecified: Secondary | ICD-10-CM | POA: Diagnosis not present

## 2020-12-23 DIAGNOSIS — C9000 Multiple myeloma not having achieved remission: Secondary | ICD-10-CM | POA: Diagnosis not present

## 2020-12-24 DIAGNOSIS — C9000 Multiple myeloma not having achieved remission: Secondary | ICD-10-CM | POA: Diagnosis not present

## 2020-12-26 DIAGNOSIS — Z9484 Stem cells transplant status: Secondary | ICD-10-CM | POA: Diagnosis not present

## 2020-12-26 DIAGNOSIS — C9000 Multiple myeloma not having achieved remission: Secondary | ICD-10-CM | POA: Diagnosis not present

## 2020-12-26 DIAGNOSIS — F32A Depression, unspecified: Secondary | ICD-10-CM | POA: Diagnosis not present

## 2020-12-26 DIAGNOSIS — R197 Diarrhea, unspecified: Secondary | ICD-10-CM | POA: Diagnosis not present

## 2020-12-27 DIAGNOSIS — C9000 Multiple myeloma not having achieved remission: Secondary | ICD-10-CM | POA: Diagnosis not present

## 2020-12-27 DIAGNOSIS — D61810 Antineoplastic chemotherapy induced pancytopenia: Secondary | ICD-10-CM | POA: Diagnosis not present

## 2020-12-27 DIAGNOSIS — Z9484 Stem cells transplant status: Secondary | ICD-10-CM | POA: Diagnosis not present

## 2020-12-27 DIAGNOSIS — R112 Nausea with vomiting, unspecified: Secondary | ICD-10-CM | POA: Diagnosis not present

## 2020-12-29 DIAGNOSIS — C9000 Multiple myeloma not having achieved remission: Secondary | ICD-10-CM | POA: Diagnosis not present

## 2020-12-29 DIAGNOSIS — R197 Diarrhea, unspecified: Secondary | ICD-10-CM | POA: Diagnosis not present

## 2020-12-29 DIAGNOSIS — Z9484 Stem cells transplant status: Secondary | ICD-10-CM | POA: Diagnosis not present

## 2020-12-29 DIAGNOSIS — K1230 Oral mucositis (ulcerative), unspecified: Secondary | ICD-10-CM | POA: Diagnosis not present

## 2020-12-31 DIAGNOSIS — R509 Fever, unspecified: Secondary | ICD-10-CM | POA: Diagnosis not present

## 2020-12-31 DIAGNOSIS — C9000 Multiple myeloma not having achieved remission: Secondary | ICD-10-CM | POA: Diagnosis not present

## 2020-12-31 DIAGNOSIS — Z9484 Stem cells transplant status: Secondary | ICD-10-CM | POA: Diagnosis not present

## 2021-01-01 DIAGNOSIS — R5081 Fever presenting with conditions classified elsewhere: Secondary | ICD-10-CM | POA: Diagnosis not present

## 2021-01-01 DIAGNOSIS — C9000 Multiple myeloma not having achieved remission: Secondary | ICD-10-CM | POA: Diagnosis not present

## 2021-01-01 DIAGNOSIS — Z9484 Stem cells transplant status: Secondary | ICD-10-CM | POA: Diagnosis not present

## 2021-01-01 DIAGNOSIS — D709 Neutropenia, unspecified: Secondary | ICD-10-CM | POA: Diagnosis not present

## 2021-01-02 DIAGNOSIS — R5081 Fever presenting with conditions classified elsewhere: Secondary | ICD-10-CM | POA: Diagnosis not present

## 2021-01-02 DIAGNOSIS — D709 Neutropenia, unspecified: Secondary | ICD-10-CM | POA: Diagnosis not present

## 2021-01-02 DIAGNOSIS — C9000 Multiple myeloma not having achieved remission: Secondary | ICD-10-CM | POA: Diagnosis not present

## 2021-01-02 DIAGNOSIS — Z9484 Stem cells transplant status: Secondary | ICD-10-CM | POA: Diagnosis not present

## 2021-01-03 DIAGNOSIS — R5081 Fever presenting with conditions classified elsewhere: Secondary | ICD-10-CM | POA: Diagnosis not present

## 2021-01-03 DIAGNOSIS — Z9484 Stem cells transplant status: Secondary | ICD-10-CM | POA: Diagnosis not present

## 2021-01-03 DIAGNOSIS — C9000 Multiple myeloma not having achieved remission: Secondary | ICD-10-CM | POA: Diagnosis not present

## 2021-01-03 DIAGNOSIS — D709 Neutropenia, unspecified: Secondary | ICD-10-CM | POA: Diagnosis not present

## 2021-01-04 DIAGNOSIS — Z9484 Stem cells transplant status: Secondary | ICD-10-CM | POA: Diagnosis not present

## 2021-01-04 DIAGNOSIS — D709 Neutropenia, unspecified: Secondary | ICD-10-CM | POA: Diagnosis not present

## 2021-01-04 DIAGNOSIS — R5081 Fever presenting with conditions classified elsewhere: Secondary | ICD-10-CM | POA: Diagnosis not present

## 2021-01-04 DIAGNOSIS — Z452 Encounter for adjustment and management of vascular access device: Secondary | ICD-10-CM | POA: Diagnosis not present

## 2021-01-04 DIAGNOSIS — C9000 Multiple myeloma not having achieved remission: Secondary | ICD-10-CM | POA: Diagnosis not present

## 2021-01-05 DIAGNOSIS — C9000 Multiple myeloma not having achieved remission: Secondary | ICD-10-CM | POA: Diagnosis not present

## 2021-01-07 ENCOUNTER — Other Ambulatory Visit: Payer: Self-pay

## 2021-01-07 DIAGNOSIS — C9000 Multiple myeloma not having achieved remission: Secondary | ICD-10-CM

## 2021-01-13 ENCOUNTER — Other Ambulatory Visit: Payer: Self-pay

## 2021-01-13 ENCOUNTER — Inpatient Hospital Stay: Payer: BC Managed Care – PPO | Attending: Hematology

## 2021-01-13 DIAGNOSIS — C9000 Multiple myeloma not having achieved remission: Secondary | ICD-10-CM | POA: Diagnosis not present

## 2021-01-13 LAB — COMPREHENSIVE METABOLIC PANEL
ALT: 17 U/L (ref 0–44)
AST: 19 U/L (ref 15–41)
Albumin: 3.7 g/dL (ref 3.5–5.0)
Alkaline Phosphatase: 68 U/L (ref 38–126)
Anion gap: 9 (ref 5–15)
BUN: 6 mg/dL (ref 6–20)
CO2: 31 mmol/L (ref 22–32)
Calcium: 9.6 mg/dL (ref 8.9–10.3)
Chloride: 104 mmol/L (ref 98–111)
Creatinine, Ser: 0.74 mg/dL (ref 0.44–1.00)
GFR, Estimated: >60 mL/min (ref >60)
Glucose, Bld: 104 mg/dL — ABNORMAL HIGH (ref 70–99)
Potassium: 4.1 mmol/L (ref 3.5–5.1)
Sodium: 144 mmol/L (ref 135–145)
Total Bilirubin: 0.4 mg/dL (ref 0.3–1.2)
Total Protein: 6.2 g/dL — ABNORMAL LOW (ref 6.5–8.1)

## 2021-01-13 LAB — CBC WITH DIFFERENTIAL/PLATELET
Abs Immature Granulocytes: 0.13 10*3/uL — ABNORMAL HIGH (ref 0.00–0.07)
Basophils Absolute: 0 10*3/uL (ref 0.0–0.1)
Basophils Relative: 1 %
Eosinophils Absolute: 0 10*3/uL (ref 0.0–0.5)
Eosinophils Relative: 0 %
HCT: 30.5 % — ABNORMAL LOW (ref 36.0–46.0)
Hemoglobin: 10.3 g/dL — ABNORMAL LOW (ref 12.0–15.0)
Immature Granulocytes: 4 %
Lymphocytes Relative: 26 %
Lymphs Abs: 0.9 10*3/uL (ref 0.7–4.0)
MCH: 26.6 pg (ref 26.0–34.0)
MCHC: 33.8 g/dL (ref 30.0–36.0)
MCV: 78.8 fL — ABNORMAL LOW (ref 80.0–100.0)
Monocytes Absolute: 0.6 10*3/uL (ref 0.1–1.0)
Monocytes Relative: 15 %
Neutro Abs: 1.9 10*3/uL (ref 1.7–7.7)
Neutrophils Relative %: 54 %
Platelets: 250 10*3/uL (ref 150–400)
RBC: 3.87 MIL/uL (ref 3.87–5.11)
RDW: 19.3 % — ABNORMAL HIGH (ref 11.5–15.5)
WBC: 3.6 10*3/uL — ABNORMAL LOW (ref 4.0–10.5)
nRBC: 0 % (ref 0.0–0.2)

## 2021-01-13 LAB — PLATELET COUNT (CANCER CENTER ONLY): Platelet Count: 250 10*3/uL (ref 150–400)

## 2021-01-13 LAB — MAGNESIUM: Magnesium: 1.7 mg/dL (ref 1.7–2.4)

## 2021-01-14 LAB — KAPPA/LAMBDA LIGHT CHAINS
Kappa free light chain: 5.8 mg/L (ref 3.3–19.4)
Kappa, lambda light chain ratio: 1.04 (ref 0.26–1.65)
Lambda free light chains: 5.6 mg/L — ABNORMAL LOW (ref 5.7–26.3)

## 2021-01-17 LAB — MULTIPLE MYELOMA PANEL, SERUM
Albumin SerPl Elph-Mcnc: 3.4 g/dL (ref 2.9–4.4)
Albumin/Glob SerPl: 1.5 (ref 0.7–1.7)
Alpha 1: 0.3 g/dL (ref 0.0–0.4)
Alpha2 Glob SerPl Elph-Mcnc: 0.7 g/dL (ref 0.4–1.0)
B-Globulin SerPl Elph-Mcnc: 1 g/dL (ref 0.7–1.3)
Gamma Glob SerPl Elph-Mcnc: 0.4 g/dL (ref 0.4–1.8)
Globulin, Total: 2.4 g/dL (ref 2.2–3.9)
IgA: 22 mg/dL — ABNORMAL LOW (ref 87–352)
IgG (Immunoglobin G), Serum: 570 mg/dL — ABNORMAL LOW (ref 586–1602)
IgM (Immunoglobulin M), Srm: 8 mg/dL — ABNORMAL LOW (ref 26–217)
Total Protein ELP: 5.8 g/dL — ABNORMAL LOW (ref 6.0–8.5)

## 2021-01-20 DIAGNOSIS — Z792 Long term (current) use of antibiotics: Secondary | ICD-10-CM | POA: Diagnosis not present

## 2021-01-20 DIAGNOSIS — B009 Herpesviral infection, unspecified: Secondary | ICD-10-CM | POA: Diagnosis not present

## 2021-01-20 DIAGNOSIS — R638 Other symptoms and signs concerning food and fluid intake: Secondary | ICD-10-CM | POA: Diagnosis not present

## 2021-01-20 DIAGNOSIS — Z9484 Stem cells transplant status: Secondary | ICD-10-CM | POA: Diagnosis not present

## 2021-01-20 DIAGNOSIS — R5381 Other malaise: Secondary | ICD-10-CM | POA: Diagnosis not present

## 2021-01-20 DIAGNOSIS — R000 Tachycardia, unspecified: Secondary | ICD-10-CM | POA: Diagnosis not present

## 2021-01-20 DIAGNOSIS — C9000 Multiple myeloma not having achieved remission: Secondary | ICD-10-CM | POA: Diagnosis not present

## 2021-02-16 ENCOUNTER — Other Ambulatory Visit: Payer: Self-pay

## 2021-02-16 ENCOUNTER — Ambulatory Visit: Payer: BC Managed Care – PPO | Attending: Family | Admitting: Rehabilitation

## 2021-02-16 ENCOUNTER — Encounter: Payer: Self-pay | Admitting: Rehabilitation

## 2021-02-16 DIAGNOSIS — C9000 Multiple myeloma not having achieved remission: Secondary | ICD-10-CM

## 2021-02-16 DIAGNOSIS — R5383 Other fatigue: Secondary | ICD-10-CM

## 2021-02-16 DIAGNOSIS — R262 Difficulty in walking, not elsewhere classified: Secondary | ICD-10-CM

## 2021-02-16 DIAGNOSIS — M6281 Muscle weakness (generalized): Secondary | ICD-10-CM

## 2021-02-16 NOTE — Therapy (Signed)
[place], [place], [address] Phone: [phone]   Fax:  [phone]  Physical Therapy Treatment  Patient Details  Name: Hannah [name] MRN: [id number] Date of Birth: 1960-12-04 Referring Provider (PT): Cira Rue   Encounter Date: 02/16/2021   PT End of Session - 02/16/21 1342     Visit Number 10    Number of Visits 22    Date for PT Re-Evaluation 04/13/21    PT Start Time 1300    PT Stop Time 1345    PT Time Calculation (min) 45 min    Activity Tolerance Treatment limited secondary to medical complications (Comment)    Behavior During Therapy [facility] for tasks assessed/performed             Past Medical History:  Diagnosis Date   Allergy    Anemia    Anxiety    Arthritis    Asthma    Depression    Hypertension     Past Surgical History:  Procedure Laterality Date   CHOLECYSTECTOMY  1993   COLONOSCOPY     TOTAL KNEE ARTHROPLASTY Right 01/28/2016   TOTAL KNEE ARTHROPLASTY Right 01/28/2016   Procedure: RIGHT TOTAL KNEE ARTHROPLASTY;  Surgeon: [name], MD;  Location: [place];  Service: Orthopedics;  Laterality: Right;   TRIGGER FINGER RELEASE Right 06/18/2014   Procedure: RIGHT LONG FINGER TRIGGER RELEASE;  Surgeon: [name], MD;  Location: [place];  Service: Orthopedics;  Laterality: Right;   TUBAL LIGATION     VAGINAL HYSTERECTOMY Bilateral 11/04/2016   Procedure: HYSTERECTOMY VAGINAL with Bilateral Salpingectomy;  Surgeon: [name], MD;  Location: [facility];  Service: Gynecology;  Laterality: Bilateral;   WEIL OSTEOTOMY Right 07/07/2020   Procedure: RIGHT FOOT WEIL OSTEOTOMY 2, 3, AND 4 METATARSALS AND PROXIMAL INTERPHALANGEAL JOINT FUSION 2 & 4 TOES;  Surgeon: Newt Minion, MD;  Location: [place];  Service: Orthopedics;  Laterality: Right;    There were no vitals filed for this visit.   Subjective Assessment - 02/16/21 1258      Subjective I had my transplant.  I am feeling tired but getting better with the fatigue I am trying to get out of bed and up the stairs a few times per day.    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 She as diagnosed in February 2022 with Multiple Myeloma not in remission. 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 no limits    How long can you stand comfortably? 42mn    How long can you walk comfortably? 577m    Patient Stated Goals get strong, build up the muscles    Currently in Pain? No/denies                [facility]       Assessment   Medical Diagnosis Multiple Myeloma    Referring Provider (PT) LaCira Rue  Hand Dominance Right    Prior Therapy yes      Precautions   Precaution Comments multiple myeloma      Restrictions   Weight Bearing Restrictions No      Balance Screen   Has the patient fallen in the past 6 months No    Has the patient  had a decrease in activity level because of a fear of falling?  No    Is the patient reluctant to leave their home because of a fear of falling?  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    Leisure walking      Cognition   Overall Cognitive Status Within Functional Limits for tasks assessed      Sit to Stand   Comments 5xSTS: 24 seconds      ROM / Strength   AROM / PROM / Strength Strength      Strength   Strength Assessment Site Hand    Right/Left hand Right;Left    Right Hand Grip (lbs) 22    Left Hand Grip (lbs) 22      Transfers   Five time sit to stand comments  24 seconds      Ambulation/Gait   Gait Comments TUG without AD x 14seconds      6 Minute Walk- Baseline    6 Minute Walk- Baseline yes      6 Minute walk- Post Test   6 Minute Walk Post Test yes      6 minute walk test results    Aerobic Endurance Distance Walked 643    Endurance additional comments stopped due to feeling weak                                         PT Long Term Goals - 02/16/21 1346       PT LONG TERM GOAL #1   Title Pt will return to the gym    Time 8    Period Weeks    Status New      PT LONG TERM GOAL #2   Title Pt will improve 5mn walk test to at least 10058fto demonstrate decreased fall risk    Baseline 643    Time 8    Period Weeks    Status New      PT LONG TERM GOAL #3   Title Pt will be able to perform a 5 x sts in 20 sec or less showing improved LE strength and    Baseline 24sec    Time 8    Period Weeks    Status New      PT LONG TERM GOAL #4   Title Pt will be able to go up and down stairs at home    Time 8    Period Weeks    Status New      PT LONG TERM GOAL #5   Title NA                   Plan - 02/16/21 1343     Clinical Impression Statement Pt returns now 55 days post stem cell transplant doing much better than before.  Pt is starting to feel more energy and more strength return, she is starting to stay out of bed as much as possible and has started walking around the neighborhood.  Pt demonstrates lower than normal values on mobility and endurance testing but has improved them greatly from last assessment prior to SCT.  Pt would like to improve strength and mobility until  she is able to go back to [employer] around [employer].  pt is also interested in aquatics.    Personal Factors and Comorbidities Comorbidity 3+    Comorbidities Multiple Myeloma, multi joint OA, recent foot surgery, fall risk    Examination-Activity Limitations Sleep;Squat;Stand;Stairs;Transfers;Locomotion Level;Sit    Examination-Participation Restrictions Cleaning;Community Activity;Occupation;Shop    Stability/Clinical  Decision Making Stable/Uncomplicated    Clinical Decision Making Low    Rehab Potential Good    PT Frequency 2x / week    PT Duration 8 weeks    PT Treatment/Interventions ADLs/Self Care Home Management;Aquatic Therapy;Gait training;Stair training;Functional mobility training;Therapeutic activities;Therapeutic exercise;Balance training;Neuromuscular re-education;Manual techniques;Patient/family education;Passive range of motion;Joint Manipulations    PT Next Visit Plan aerobic activity with walking/nustep, general strengthening *immunocompromised status post SCT, include step up/stairs work    Oncologist with Plan of Care Patient             Patient will benefit from skilled therapeutic intervention in order to improve the following deficits and impairments:  Abnormal gait, Decreased mobility, Decreased activity tolerance, Decreased endurance, Decreased strength, Decreased balance, Decreased knowledge of precautions, Difficulty walking, Postural dysfunction, Pain  Visit Diagnosis: Muscle weakness (generalized)  Multiple myeloma not having achieved remission (HCC)  Difficulty in walking, not elsewhere classified  Fatigue, unspecified type     Problem List Patient Active Problem List   Diagnosis Date Noted   Multiple myeloma ([place]) 07/13/2020   Claw toe, right    Trigger finger, left middle finger 04/09/2020   Stenosing tenosynovitis of finger of left hand 02/27/2020   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   Major depression, recurrent ([place]) 01/12/2018   Chronic right shoulder pain 12/20/2017   MDD (major depressive disorder), recurrent episode, moderate ([place]) 11/04/2017   MGUS (monoclonal gammopathy of unknown significance) 10/24/2017   Primary osteoarthritis of both hands 09/26/2017   Primary osteoarthritis of right shoulder 09/26/2017   Primary osteoarthritis of both hips 09/26/2017   Status post total knee replacement, right 09/26/2017   Primary osteoarthritis of  both feet 09/26/2017   History of asthma 09/26/2017   Primary osteoarthritis of left knee 04/18/2017   Menorrhagia 11/04/2016   Fibroid tumor 11/04/2016   Adenomyosis 11/04/2016   Hypertension, essential 11/04/2016   Total knee replacement status 01/28/2016    Stark Bray, PT 02/16/2021, 1:49 PM  [place], [place], [address] Phone: [phone]   Fax:  [phone]  Name: LATARA MICHELI MRN: [id number] Date of Birth: 1960/12/10

## 2021-02-18 ENCOUNTER — Other Ambulatory Visit: Payer: Self-pay

## 2021-02-18 ENCOUNTER — Ambulatory Visit: Payer: BC Managed Care – PPO | Admitting: Rehabilitation

## 2021-02-18 DIAGNOSIS — C9000 Multiple myeloma not having achieved remission: Secondary | ICD-10-CM | POA: Diagnosis not present

## 2021-02-18 DIAGNOSIS — M6281 Muscle weakness (generalized): Secondary | ICD-10-CM

## 2021-02-18 DIAGNOSIS — R262 Difficulty in walking, not elsewhere classified: Secondary | ICD-10-CM | POA: Diagnosis not present

## 2021-02-18 DIAGNOSIS — R5383 Other fatigue: Secondary | ICD-10-CM | POA: Diagnosis not present

## 2021-02-18 NOTE — Therapy (Signed)
[place], [place], [address] Phone: [phone]   Fax:  [phone]  Physical Therapy Treatment  Patient Details  Name: Hannah [name] MRN: [id number] Date of Birth: 06/19/1960 Referring Provider (PT): Cira Rue   Encounter Date: 02/18/2021   PT End of Session - 02/18/21 1404     Visit Number 11    Number of Visits 22    Date for PT Re-Evaluation 04/13/21    PT Start Time 1330    PT Stop Time 1400    PT Time Calculation (min) 30 min    Activity Tolerance Patient tolerated treatment well    Behavior During Therapy [facility] for tasks assessed/performed             Past Medical History:  Diagnosis Date   Allergy    Anemia    Anxiety    Arthritis    Asthma    Depression    Hypertension     Past Surgical History:  Procedure Laterality Date   CHOLECYSTECTOMY  1993   COLONOSCOPY     TOTAL KNEE ARTHROPLASTY Right 01/28/2016   TOTAL KNEE ARTHROPLASTY Right 01/28/2016   Procedure: RIGHT TOTAL KNEE ARTHROPLASTY;  Surgeon: [name], MD;  Location: [place];  Service: Orthopedics;  Laterality: Right;   TRIGGER FINGER RELEASE Right 06/18/2014   Procedure: RIGHT LONG FINGER TRIGGER RELEASE;  Surgeon: [name], MD;  Location: [place];  Service: Orthopedics;  Laterality: Right;   TUBAL LIGATION     VAGINAL HYSTERECTOMY Bilateral 11/04/2016   Procedure: HYSTERECTOMY VAGINAL with Bilateral Salpingectomy;  Surgeon: [name], MD;  Location: [facility];  Service: Gynecology;  Laterality: Bilateral;   WEIL OSTEOTOMY Right 07/07/2020   Procedure: RIGHT FOOT WEIL OSTEOTOMY 2, 3, AND 4 METATARSALS AND PROXIMAL INTERPHALANGEAL JOINT FUSION 2 & 4 TOES;  Surgeon: Newt Minion, MD;  Location: [place];  Service: Orthopedics;  Laterality: Right;    There were no vitals filed for this visit.   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 She as diagnosed in February 2022 with Multiple Myeloma not in remission. 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 No/denies                               [facility] Adult PT Treatment/Exercise - 02/18/21 0001       Exercises   Exercises Other Exercises;Shoulder    Other Exercises  bike L1 x 59mn. marching at back of bike x 15" able to do 5 on each leg, LAQ 2x5 bil, ball squeeze purple ball x 5, standing mini squat x 5, overhead press x 5 (increased shoulder pain Rt), wall push up 2x5      Shoulder Exercises: Seated   Row Strengthening    Theraband Level (Shoulder Row) Level 1 (Yellow)    Row Limitations 2x5 some Rt shoulder pain    Other Seated Exercises bicep curl 2# 2x5                          PT Long Term Goals - 02/16/21 1346       PT LONG TERM  GOAL #1   Title Pt will return to the gym    Time 8    Period Weeks    Status New      PT LONG TERM GOAL #2   Title Pt will improve 22mn walk test to at least 1003fto demonstrate decreased fall risk    Baseline 643    Time 8    Period Weeks    Status New      PT LONG TERM GOAL #3   Title Pt will be able to perform a 5 x sts in 20 sec or less showing improved LE strength and    Baseline 24sec    Time 8    Period Weeks    Status New      PT LONG TERM GOAL #4   Title Pt will be able to go up and down stairs at home    Time 8    Period Weeks    Status New      PT LONG TERM GOAL #5   Title NA                   Plan - 02/18/21 1404     Clinical Impression Statement Pt tolerated first session of PT post SCT well.  Fatigue limited reps and time on bike but pt was able to perform all as planned.  Plan on increasing bike time and reps as tolerated    PT Frequency 2x / week    PT Duration 8 weeks    PT  Treatment/Interventions ADLs/Self Care Home Management;Aquatic Therapy;Gait training;Stair training;Functional mobility training;Therapeutic activities;Therapeutic exercise;Balance training;Neuromuscular re-education;Manual techniques;Patient/family education;Passive range of motion;Joint Manipulations    PT Next Visit Plan *immunocompromised status post SCT wipe equipment before use*, aerobic activity with walking/nustep/bike, general strengthening, include step up/stairs work    PT HoIzardone currently    Recommended Other Services aquatics when able    CoNewell Rubbermaidnd Agree with Plan of Care Patient             Patient will benefit from skilled therapeutic intervention in order to improve the following deficits and impairments:     Visit Diagnosis: Muscle weakness (generalized)  Multiple myeloma not having achieved remission ([facility] Difficulty in walking, not elsewhere classified  Fatigue, unspecified type     Problem List Patient Active Problem List   Diagnosis Date Noted   Multiple myeloma (HCP[place]02/14/2022   Claw toe, right    Trigger finger, left middle finger 04/09/2020   Stenosing tenosynovitis of finger of left hand 02/27/2020   Hemoglobin C trait (HC[facility]10/05/2017   Major depression, recurrent ([facility]CL[place]08/16/2019   Chronic right shoulder pain 12/20/2017   MDD (major depressive disorder), recurrent episode, moderate (HCS[place]06/12/2017   MGUS (monoclonal gammopathy of unknown significance) 10/24/2017   Primary osteoarthritis of both hands 09/26/2017   Primary osteoarthritis of right shoulder 09/26/2017   Primary osteoarthritis of both hips 09/26/2017   Status post total knee replacement, right 09/26/2017   Primary osteoarthritis of both feet 09/26/2017   History of asthma 09/26/2017   Primary osteoarthritis of left knee 04/18/2017   Menorrhagia 11/04/2016   Fibroid tumor 11/04/2016   Adenomyosis 11/04/2016   Hypertension, essential 11/04/2016   Total knee  replacement status 01/28/2016    TeStark BrayPT 02/18/2021, 2:07 PM  [id number]: [phone] Fax:  [phone]: Hannah IVINSRN:  [id number] Date of Birth: 03-17-1961

## 2021-02-22 ENCOUNTER — Other Ambulatory Visit: Payer: Self-pay

## 2021-02-22 ENCOUNTER — Ambulatory Visit: Payer: BC Managed Care – PPO

## 2021-02-22 DIAGNOSIS — R262 Difficulty in walking, not elsewhere classified: Secondary | ICD-10-CM

## 2021-02-22 DIAGNOSIS — R5383 Other fatigue: Secondary | ICD-10-CM | POA: Diagnosis not present

## 2021-02-22 DIAGNOSIS — M6281 Muscle weakness (generalized): Secondary | ICD-10-CM | POA: Diagnosis not present

## 2021-02-22 DIAGNOSIS — C9000 Multiple myeloma not having achieved remission: Secondary | ICD-10-CM | POA: Diagnosis not present

## 2021-02-22 NOTE — Therapy (Signed)
[place], [place], [address] Phone: [phone]   Fax:  [phone]  Physical Therapy Treatment  Patient Details  Name: Hannah [name] MRN: [id number] Date of Birth: 03-14-1961 Referring Provider (PT): Cira Rue   Encounter Date: 02/22/2021   PT End of Session - 02/22/21 1013     Visit Number 12    Number of Visits 22    Date for PT Re-Evaluation 04/13/21    PT Start Time 1005    PT Stop Time 1047    PT Time Calculation (min) 42 min    Activity Tolerance Patient tolerated treatment well    Behavior During Therapy [facility] for tasks assessed/performed             Past Medical History:  Diagnosis Date   Allergy    Anemia    Anxiety    Arthritis    Asthma    Depression    Hypertension     Past Surgical History:  Procedure Laterality Date   CHOLECYSTECTOMY  1993   COLONOSCOPY     TOTAL KNEE ARTHROPLASTY Right 01/28/2016   TOTAL KNEE ARTHROPLASTY Right 01/28/2016   Procedure: RIGHT TOTAL KNEE ARTHROPLASTY;  Surgeon: [name], MD;  Location: [place];  Service: Orthopedics;  Laterality: Right;   TRIGGER FINGER RELEASE Right 06/18/2014   Procedure: RIGHT LONG FINGER TRIGGER RELEASE;  Surgeon: [name], MD;  Location: [place];  Service: Orthopedics;  Laterality: Right;   TUBAL LIGATION     VAGINAL HYSTERECTOMY Bilateral 11/04/2016   Procedure: HYSTERECTOMY VAGINAL with Bilateral Salpingectomy;  Surgeon: [name], MD;  Location: [facility];  Service: Gynecology;  Laterality: Bilateral;   WEIL OSTEOTOMY Right 07/07/2020   Procedure: RIGHT FOOT WEIL OSTEOTOMY 2, 3, AND 4 METATARSALS AND PROXIMAL INTERPHALANGEAL JOINT FUSION 2 & 4 TOES;  Surgeon: Newt Minion, MD;  Location: [place];  Service: Orthopedics;  Laterality: Right;    There were no vitals filed for this visit.   Subjective Assessment - 02/22/21 1005     Subjective I am working on  getting my stamina back. I did pretty good after last visit, but I went home and took a nap afterwards.    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 She as diagnosed in February 2022 with Multiple Myeloma not in remission. 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 no limits    How long can you stand comfortably? 13mn    How long can you walk comfortably? 569m    Diagnostic tests no scans scheduled recently    Patient Stated Goals get strong, build up the muscles    Currently in Pain? No/denies    Pain Score 0-No pain    Multiple Pain Sites No                               [facility] Adult PT Treatment/Exercise - 02/22/21 0001       Exercises   Other Exercises  bike L1 x 3:3072m marching at back of bike x 22" able to do 8 on each leg, LAQ 2x7 bil, ball squeeze purple ball x 7, standing mini squat 2x 7, overhead press x 5 (no shoulder pain Rt), wall push up 2x6, heel raises 2  x 5      Shoulder Exercises: Seated   Row Strengthening    Theraband Level (Shoulder Row) Level 1 (Yellow)    Row Limitations 2x5  no Rt shoulder pain                          PT Long Term Goals - 02/16/21 1346       PT LONG TERM GOAL #1   Title Pt will return to the gym    Time 8    Period Weeks    Status New      PT LONG TERM GOAL #2   Title Pt will improve 59mn walk test to at least 10061fto demonstrate decreased fall risk    Baseline 643    Time 8    Period Weeks    Status New      PT LONG TERM GOAL #3   Title Pt will be able to perform a 5 x sts in 20 sec or less showing improved LE strength and    Baseline 24sec    Time 8    Period Weeks    Status New      PT LONG TERM GOAL #4   Title Pt will be able to go up and down stairs at home    Time 8    Period Weeks    Status New      PT LONG TERM GOAL #5   Title NA                    Plan - 02/22/21 1051     Clinical Impression Statement Pt was able to progress bike time by 30 sec and was able to increase reps in all cases.  She had no shoulder pain today with shoulder exercises and required only a few cues for form    Personal Factors and Comorbidities Comorbidity 3+    Comorbidities Multiple Myeloma, multi joint OA, recent foot surgery, fall risk,SCT    Examination-Activity Limitations Sleep;Squat;Stand;Stairs;Transfers;Locomotion Level;Sit    Examination-Participation Restrictions Cleaning;Community Activity;Occupation;Shop    Stability/Clinical Decision Making Stable/Uncomplicated    Rehab Potential Good    PT Frequency 2x / week    PT Duration 8 weeks    PT Treatment/Interventions ADLs/Self Care Home Management;Aquatic Therapy;Gait training;Stair training;Functional mobility training;Therapeutic activities;Therapeutic exercise;Balance training;Neuromuscular re-education;Manual techniques;Patient/family education;Passive range of motion;Joint Manipulations    PT Next Visit Plan *immunocompromised status post SCT wipe equipment before use*, aerobic activity with walking/nustep/bike, general strengthening, include step up/stairs work    PT HoTreynorone currently    Consulted and Agree with Plan of Care Patient             Patient will benefit from skilled therapeutic intervention in order to improve the following deficits and impairments:  Abnormal gait, Decreased mobility, Decreased activity tolerance, Decreased endurance, Decreased strength, Decreased balance, Decreased knowledge of precautions, Difficulty walking, Postural dysfunction, Pain  Visit Diagnosis: Muscle weakness (generalized)  Multiple myeloma not having achieved remission (HCR[place] Difficulty in walking, not elsewhere classified  Fatigue, unspecified type     Problem List Patient Active Problem List   Diagnosis Date Noted   Multiple myeloma (HCD[place] 07/13/2020   Claw toe, right    Trigger finger, left middle finger 04/09/2020   Stenosing tenosynovitis of finger of left hand 02/27/2020   Hemoglobin C trait (HCE[place]10/05/2017   Major depression, recurrent (HCBarnum Island08/16/2019   Chronic right shoulder pain  12/20/2017   MDD (major depressive disorder), recurrent episode, moderate ([place]) 11/04/2017   MGUS (monoclonal gammopathy of unknown significance) 10/24/2017   Primary osteoarthritis of both hands 09/26/2017   Primary osteoarthritis of right shoulder 09/26/2017   Primary osteoarthritis of both hips 09/26/2017   Status post total knee replacement, right 09/26/2017   Primary osteoarthritis of both feet 09/26/2017   History of asthma 09/26/2017   Primary osteoarthritis of left knee 04/18/2017   Menorrhagia 11/04/2016   Fibroid tumor 11/04/2016   Adenomyosis 11/04/2016   Hypertension, essential 11/04/2016   Total knee replacement status 01/28/2016    Claris Pong, PT 02/22/2021, 10:54 AM  [place], [place], [address] Phone: [phone]   Fax:  [phone]  Name: Hannah [name] MRN: [id number] Date of Birth: January 18, 1961

## 2021-02-23 NOTE — Progress Notes (Signed)
[place]   [phone] [phone]   Clinic Follow up Note   Patient Care Team: Tisovec, Fransico Him, MD as PCP - General (Internal Medicine) Bo Merino, MD as Consulting Physician (Rheumatology) Truitt Merle, MD as Consulting Physician (Hematology)  Date of Service:  02/23/2021  CHIEF COMPLAINT: f/u of multiple myeloma  ASSESSMENT & PLAN:  Hannah [name] is a 60 y.o. female with   1. 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 I previously discussed with pathologist Dr. Gari Crown, the increased plasma cells in bone marrow is likely between 10 to 20%, and would favor smoldering multiple myeloma than MGUS. She initially did not meet CRAB Criteria for MM. -After increase of IgA, light chain and M-protein (3.3) on 06/01/20 labs, multiple T1 lesions in her lumbar spine and sacrum on 07/03/20 MRI, hypermetabolic uptake in multiple bone lesion on 07/24/20 PET, she meets the diagnostic criteria for multiple myeloma -She underwent a bone marrow biopsy on 07/30/20, which showed 31% plasma cells. Cytology and FISH was negative gene mutations, which indicating standard risk disease. -She has completed palliative radiation to sacral bone lesions in 07/2020.  -She had induction chemotherapy with VRd (Velcade, Revlimid 2 weeks on, and 1 week off and dexamethasone) from March to end of June 2022. -She underwent High-Dose Chemotherapy (HDCT) with melphalan 200 mg/m2 and autologous stem cell reinfusion (SCT) on 12/23/20 at [facility] -she is recovering well from her stem cell transplant -Plan to start maintenance Revlimid around 100 days after transplant, she is also considering a clinical trial at [place] reviewed, moderate anemia, she is not symptomatic, no need blood  transfusion. -She will follow-up with Dr. Terance Hart at [facility], and call me for f/u if needed      2. Low back pain, right hip pain, Mid right back pain, secondary to MM -During work up with orthopedic surgeon, she was found to have multiple T1 lesions of her spine and sacrum compatible with MM on her 07/03/20 MRI lumbar spine. These were hypermetabolic on 6/94/85 PET.  -She has completed her course of palliative radiation in 07/2020.  -pain has resolved  -she received zometa before stem cell transplant   3. 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 HTN, OA, Obesity, Anxiety, depression, Hypokalemia  -She will continue to f/u with PCP, Chiropractor and Ortho.  -For Anxiety and depression she is under care of [place] provider. Mood stable on Zoloft -she is on oral K    PLAN:  -Labs reviewed, no need for blood transfusion -She will follow-up with Dr. Terance Hart at [facility] in 5-6 weeks to start maintenance Revlimid, and she is considering the clinical trial. -We will call me if she needs follow-up with [place].    SUMMARY OF ONCOLOGIC HISTORY: Oncology History Overview Note  Cancer Staging Multiple myeloma ([place]) Staging form: Plasma Cell Myeloma and Plasma Cell Disorders, AJCC 8th Edition - Clinical stage from 07/20/2020: Beta-2-microglobulin (mg/L): 2.4, Albumin (g/dL): 3.2, ISS: Stage II, High-risk cytogenetics: Unknown, LDH: Normal - Signed by Truitt Merle, MD on 08/02/2020 Beta 2 microglobulin range (mg/L): Less than 3.5 Albumin range (g/dL): Less than 3.5 Cytogenetics: Unknown    Multiple myeloma ([place])  07/03/2020 Imaging   MRI Lumbar Spine  IMPRESSION: 1. Numerous T1 hypointense and STIR  hyperintense lesions throughout the visualized spine and sacrum with multiple areas of extraosseous extension in the sacrum, detailed above and compatible with multiple myeloma given the clinical history. 2. An MRI of the pelvis with contrast could evaluate the full  extent of the partially imaged sacral lesions, including left S1-S2 neural foraminal involvement and suspected involvement of the exited left L5 nerve. 3. Multilevel degenerative change without significant canal or foraminal stenosis in the lumbar spine.   07/13/2020 Initial Diagnosis   Multiple myeloma ([place])   07/20/2020 Cancer Staging   Staging form: Plasma Cell Myeloma and Plasma Cell Disorders, AJCC 8th Edition - Clinical stage from 07/20/2020: Beta-2-microglobulin (mg/L): 2.4, Albumin (g/dL): 3.2, ISS: Stage II, High-risk cytogenetics: Unknown, LDH: Normal - Signed by Truitt Merle, MD on 08/31/2020 Beta 2 microglobulin range (mg/L): Less than 3.5 Albumin range (g/dL): Less than 3.5 Cytogenetics: No abnormalities Bone disease on imaging: Present   07/24/2020 PET scan   IMPRESSION: 1. Moderate to high hypermetabolic activity associated with the expansile soft tissue masses within the LEFT and RIGHT pelvic bones is consistent with active multiple myeloma / plasmacytoma. 2. Lytic lesion in the LEFT scapula with mild metabolic activity is indeterminate. 3. Metabolic activity associated with the RIGHT knee prosthetic and RIGHT distal foot fusion is favored post procedural inflammation.     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  The findings are most consistent with  persistent/recurrent/previously known plasma cell neoplasm.  For  completeness,  immunohistochemical stain for CD138 and in situ  hybridization for kappa and lambda will be performed and the results  reported in an addendum.  Correlation with cytogenetic and FISH studies  is recommended.   08/03/2020 -  Chemotherapy   Zometa q4weeks starting 08/03/20    08/17/2020 - 11/29/2020 Chemotherapy   Velcade weekly, Revlimid, dex 62m weekly (VRd)  Starting 08/17/20. Last dose Velcade 11/23/20 and last dose revlimid on 11/29/20       CURRENT THERAPY:  Pending maintenance Revlimid   INTERVAL HISTORY:  Hannah BASICHis here for a follow up after bone marrow transplant.  She is 2 months out now, and is recovering well overall.  She still has moderate fatigue, able to function.  She is eating better, is gaining weight back.  No pain, nausea, fever, or other complaints.   All other systems were reviewed with the patient and are negative.  MEDICAL HISTORY:  Past Medical History:  Diagnosis Date   Allergy    Anemia    Anxiety    Arthritis    Asthma    Depression    Hypertension     SURGICAL HISTORY: Past Surgical History:  Procedure Laterality Date   CHOLECYSTECTOMY  1993   COLONOSCOPY     TOTAL KNEE ARTHROPLASTY Right 01/28/2016   TOTAL KNEE ARTHROPLASTY Right 01/28/2016   Procedure: RIGHT TOTAL KNEE ARTHROPLASTY;  Surgeon: NLeandrew Koyanagi MD;  [place]  Service: Orthopedics;  Laterality: Right;   TRIGGER FINGER RELEASE Right 06/18/2014  Procedure: RIGHT LONG FINGER TRIGGER RELEASE;  Surgeon: [name], MD;  Location: [place];  Service: Orthopedics;  Laterality: Right;   TUBAL LIGATION     VAGINAL HYSTERECTOMY Bilateral 11/04/2016   Procedure: HYSTERECTOMY VAGINAL with Bilateral Salpingectomy;  Surgeon: [name], MD;  Location: [facility];  Service: Gynecology;  Laterality: Bilateral;   WEIL OSTEOTOMY Right 07/07/2020   Procedure: RIGHT FOOT WEIL OSTEOTOMY 2, 3, AND 4 METATARSALS AND PROXIMAL INTERPHALANGEAL JOINT FUSION 2 & 4 TOES;   Surgeon: Newt Minion, MD;  Location: [facility];  Service: Orthopedics;  Laterality: Right;    I have reviewed the social history and family history with the patient and they are unchanged from previous note.  ALLERGIES:  is allergic to elemental sulfur and sulfa antibiotics.  MEDICATIONS:  Current Outpatient Medications  Medication Sig Dispense Refill   calcium carbonate (OSCAL) 1500 (600 Ca) MG TABS tablet Take 600 mg of elemental calcium by mouth daily.     chlorthalidone (HYGROTON) 25 MG tablet chlorthalidone 25 mg tablet     Cholecalciferol (VITAMIN D3) 1.25 MG (50000 UT) CAPS Take 1 capsule by mouth once a week.     dexamethasone (DECADRON) 4 MG tablet Take by mouth.     ferrous sulfate 325 (65 FE) MG tablet Take 325 mg by mouth 2 (two) times daily with a meal.     KLOR-CON M20 20 MEQ tablet TAKE 1 TABLET BY MOUTH TWICE A DAY 180 tablet 1   lenalidomide (REVLIMID) 25 MG capsule TAKE 1 CAPSULE BY MOUTH ONCE DAILY FOR 14 DAYS ON AND 7 DAYS OFF 14 capsule 0   naproxen sodium (ALEVE) 220 MG tablet 2 (two) times daily.     ondansetron (ZOFRAN) 8 MG tablet Take 1 tablet (8 mg total) by mouth every 8 (eight) hours as needed for nausea or vomiting. 20 tablet 3   pantoprazole (PROTONIX) 20 MG tablet TAKE 1 TABLET BY MOUTH EVERY DAY 30 tablet 2   prochlorperazine (COMPAZINE) 10 MG tablet Take 1 tablet (10 mg total) by mouth every 6 (six) hours as needed for nausea or vomiting. 30 tablet 3   sertraline (ZOLOFT) 50 MG tablet sertraline 50 mg tablet     valACYclovir (VALTREX) 500 MG tablet Take 500 mg by mouth 2 (two) times daily.     vitamin C (ASCORBIC ACID) 500 MG tablet      No current facility-administered medications for this visit.    PHYSICAL EXAMINATION: ECOG PERFORMANCE STATUS: 1 - Symptomatic but completely ambulatory  There were no vitals filed for this visit.  There were no vitals filed for this visit.    Due to [place] we will limit examination to  appearance. Patient had no complaints.  GENERAL:alert, no distress and comfortable SKIN: skin color normal, no rashes or significant lesions EYES: normal, Conjunctiva are pink and non-injected, sclera clear  NEURO: alert & oriented x 3 with fluent speech   LABORATORY DATA:  I have reviewed the data as listed CBC Latest Ref Rng & Units 01/13/2021 01/13/2021 11/23/2020  WBC 4.0 - 10.5 K/uL 3.6(L) - 4.1  Hemoglobin 12.0 - 15.0 g/dL 10.3(L) - 10.2(L)  Hematocrit 36.0 - 46.0 % 30.5(L) - 29.5(L)  Platelets 150 - 400 K/uL 250 250 123(L)     CMP Latest Ref Rng & Units 01/13/2021 11/23/2020 11/16/2020  Glucose 70 - 99 mg/dL 104(H) 92 89  BUN 6 - 20 mg/dL _0 Creatinine 0.44 - 1.00 mg/dL 0.74  0.73 0.64  Sodium 135 - 145 mmol/L 144 143 137  Potassium 3.5 - 5.1 mmol/L 4.1 3.7 3.2(L)  Chloride 98 - 111 mmol/L 104 107 103  CO2 22 - 32 mmol/L _0 Calcium 8.9 - 10.3 mg/dL 9.6 8.5(L) 7.9(L)  Total Protein 6.5 - 8.1 g/dL 6.2(L) 5.5(L) 5.8(L)  Total Bilirubin 0.3 - 1.2 mg/dL 0.4 0.9 1.0  Alkaline Phos 38 - 126 U/L 68 74 69  AST 15 - 41 U/L _1 ALT 0 - 44 U/L _2 RADIOGRAPHIC STUDIES: I have personally reviewed the radiological images as listed and agreed with the findings in the report. No results found.    No problem-specific Assessment & Plan notes found for this encounter.   No orders of the defined types were placed in this encounter.   All questions were answered. The patient knows to call the clinic with any problems, questions or concerns. No barriers to learning was detected. The total time spent in the appointment was 30 minutes.     Truitt Merle, MD 02/23/2021

## 2021-02-24 ENCOUNTER — Inpatient Hospital Stay: Payer: BC Managed Care – PPO | Attending: Hematology

## 2021-02-24 ENCOUNTER — Inpatient Hospital Stay (HOSPITAL_BASED_OUTPATIENT_CLINIC_OR_DEPARTMENT_OTHER): Payer: BC Managed Care – PPO | Admitting: Hematology

## 2021-02-24 ENCOUNTER — Other Ambulatory Visit: Payer: Self-pay

## 2021-02-24 VITALS — BP 136/93 | HR 115 | Temp 98.0°F | Resp 19 | Ht 66.0 in | Wt 173.8 lb

## 2021-02-24 DIAGNOSIS — C9000 Multiple myeloma not having achieved remission: Secondary | ICD-10-CM | POA: Diagnosis not present

## 2021-02-24 LAB — COMPREHENSIVE METABOLIC PANEL
ALT: 8 U/L (ref 0–44)
AST: 16 U/L (ref 15–41)
Albumin: 4 g/dL (ref 3.5–5.0)
Alkaline Phosphatase: 65 U/L (ref 38–126)
Anion gap: 11 (ref 5–15)
BUN: 9 mg/dL (ref 6–20)
CO2: 27 mmol/L (ref 22–32)
Calcium: 9.3 mg/dL (ref 8.9–10.3)
Chloride: 106 mmol/L (ref 98–111)
Creatinine, Ser: 0.68 mg/dL (ref 0.44–1.00)
GFR, Estimated: >60 mL/min (ref >60)
Glucose, Bld: 94 mg/dL (ref 70–99)
Potassium: 3.4 mmol/L — ABNORMAL LOW (ref 3.5–5.1)
Sodium: 144 mmol/L (ref 135–145)
Total Bilirubin: 0.9 mg/dL (ref 0.3–1.2)
Total Protein: 6.5 g/dL (ref 6.5–8.1)

## 2021-02-24 LAB — CBC WITH DIFFERENTIAL/PLATELET
Abs Immature Granulocytes: 0.03 10*3/uL (ref 0.00–0.07)
Basophils Absolute: 0 10*3/uL (ref 0.0–0.1)
Basophils Relative: 0 %
Eosinophils Absolute: 0.1 10*3/uL (ref 0.0–0.5)
Eosinophils Relative: 2 %
HCT: 27.6 % — ABNORMAL LOW (ref 36.0–46.0)
Hemoglobin: 8.8 g/dL — ABNORMAL LOW (ref 12.0–15.0)
Immature Granulocytes: 1 %
Lymphocytes Relative: 25 %
Lymphs Abs: 1.3 10*3/uL (ref 0.7–4.0)
MCH: 26.9 pg (ref 26.0–34.0)
MCHC: 31.9 g/dL (ref 30.0–36.0)
MCV: 84.4 fL (ref 80.0–100.0)
Monocytes Absolute: 0.4 10*3/uL (ref 0.1–1.0)
Monocytes Relative: 8 %
Neutro Abs: 3.4 10*3/uL (ref 1.7–7.7)
Neutrophils Relative %: 64 %
Platelets: 178 10*3/uL (ref 150–400)
RBC: 3.27 MIL/uL — ABNORMAL LOW (ref 3.87–5.11)
RDW: 15.9 % — ABNORMAL HIGH (ref 11.5–15.5)
WBC: 5.2 10*3/uL (ref 4.0–10.5)
nRBC: 0.4 % — ABNORMAL HIGH (ref 0.0–0.2)

## 2021-02-24 LAB — PLATELET COUNT (CANCER CENTER ONLY): Platelet Count: 178 10*3/uL (ref 150–400)

## 2021-02-24 LAB — MAGNESIUM: Magnesium: 2 mg/dL (ref 1.7–2.4)

## 2021-02-25 ENCOUNTER — Ambulatory Visit: Payer: BC Managed Care – PPO

## 2021-02-25 DIAGNOSIS — M6281 Muscle weakness (generalized): Secondary | ICD-10-CM | POA: Diagnosis not present

## 2021-02-25 DIAGNOSIS — R5383 Other fatigue: Secondary | ICD-10-CM | POA: Diagnosis not present

## 2021-02-25 DIAGNOSIS — R262 Difficulty in walking, not elsewhere classified: Secondary | ICD-10-CM

## 2021-02-25 DIAGNOSIS — I10 Essential (primary) hypertension: Secondary | ICD-10-CM | POA: Diagnosis not present

## 2021-02-25 DIAGNOSIS — C9000 Multiple myeloma not having achieved remission: Secondary | ICD-10-CM | POA: Diagnosis not present

## 2021-02-25 LAB — KAPPA/LAMBDA LIGHT CHAINS
Kappa free light chain: 11.8 mg/L (ref 3.3–19.4)
Kappa, lambda light chain ratio: 1.93 — ABNORMAL HIGH (ref 0.26–1.65)
Lambda free light chains: 6.1 mg/L (ref 5.7–26.3)

## 2021-02-25 NOTE — Therapy (Signed)
[place], [place], [address] Phone: [phone]   Fax:  [phone]  Physical Therapy Treatment  Patient Details  Name: Hannah [name] MRN: [id number] Date of Birth: 06-18-1960 Referring Provider (PT): Cira Rue   Encounter Date: 02/25/2021   PT End of Session - 02/25/21 0812     Visit Number 13    Number of Visits 22    Date for PT Re-Evaluation 04/13/21    Authorization Type BCBS    PT Start Time 0802    PT Stop Time 0848    PT Time Calculation (min) 46 min    Activity Tolerance Patient tolerated treatment well    Behavior During Therapy [facility] for tasks assessed/performed             Past Medical History:  Diagnosis Date   Allergy    Anemia    Anxiety    Arthritis    Asthma    Depression    Hypertension     Past Surgical History:  Procedure Laterality Date   CHOLECYSTECTOMY  1993   COLONOSCOPY     TOTAL KNEE ARTHROPLASTY Right 01/28/2016   TOTAL KNEE ARTHROPLASTY Right 01/28/2016   Procedure: RIGHT TOTAL KNEE ARTHROPLASTY;  Surgeon: [name], MD;  Location: [place];  Service: Orthopedics;  Laterality: Right;   TRIGGER FINGER RELEASE Right 06/18/2014   Procedure: RIGHT LONG FINGER TRIGGER RELEASE;  Surgeon: [name], MD;  Location: [place];  Service: Orthopedics;  Laterality: Right;   TUBAL LIGATION     VAGINAL HYSTERECTOMY Bilateral 11/04/2016   Procedure: HYSTERECTOMY VAGINAL with Bilateral Salpingectomy;  Surgeon: [name], MD;  Location: [facility];  Service: Gynecology;  Laterality: Bilateral;   WEIL OSTEOTOMY Right 07/07/2020   Procedure: RIGHT FOOT WEIL OSTEOTOMY 2, 3, AND 4 METATARSALS AND PROXIMAL INTERPHALANGEAL JOINT FUSION 2 & 4 TOES;  Surgeon: Newt Minion, MD;  Location: [place];  Service: Orthopedics;  Laterality: Right;    There were no vitals filed for this visit.   Subjective Assessment - 02/25/21 0800      Subjective I did OK that day and I sat up when I got home.  The next day my muscles were sore, and I was really fatigued. Muscles are still a little sore.    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 She as diagnosed in February 2022 with Multiple Myeloma not in remission. 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 no limits    How long can you stand comfortably? 36mn    How long can you walk comfortably? 539m    Diagnostic tests no scans scheduled recently    Patient Stated Goals get strong, build up the muscles    Currently in Pain? No/denies    Pain Score 0-No pain    Multiple Pain Sites No                               [facility] Adult PT Treatment/Exercise - 02/25/21 0001       Exercises   Other Exercises  bike L1 x 3:3045m marching at back of bike x 22" able to do 10 on each leg, LAQ 2x6 bil, ball squeeze purple ball x 7, standing  mini squat 2x 6, overhead press 2 x 5 (no shoulder pain Rt), wall push up 2x6, heel raises 2 x 6                          PT Long Term Goals - 02/16/21 1346       PT LONG TERM GOAL #1   Title Pt will return to the gym    Time 8    Period Weeks    Status New      PT LONG TERM GOAL #2   Title Pt will improve 22mn walk test to at least 10037fto demonstrate decreased fall risk    Baseline 643    Time 8    Period Weeks    Status New      PT LONG TERM GOAL #3   Title Pt will be able to perform a 5 x sts in 20 sec or less showing improved LE strength and    Baseline 24sec    Time 8    Period Weeks    Status New      PT LONG TERM GOAL #4   Title Pt will be able to go up and down stairs at home    Time 8    Period Weeks    Status New      PT LONG TERM GOAL #5   Title NA                   Plan - 02/25/21 0813     Clinical  Impression Statement Pt was very fatigued the day after being here after last visit. Treatment today was maintained and decreased slightly from last visit secondary to increased fatigue after last visit. Pt uses good form with exercises and requires very few verbal cues.    Personal Factors and Comorbidities Comorbidity 3+    Comorbidities Multiple Myeloma, multi joint OA, recent foot surgery, fall risk,SCT    Examination-Activity Limitations Sleep;Squat;Stand;Stairs;Transfers;Locomotion Level;Sit    Examination-Participation Restrictions Cleaning;Community Activity;Occupation;Shop    Stability/Clinical Decision Making Stable/Uncomplicated    Rehab Potential Good    PT Frequency 2x / week    PT Duration 8 weeks    PT Treatment/Interventions ADLs/Self Care Home Management;Aquatic Therapy;Gait training;Stair training;Functional mobility training;Therapeutic activities;Therapeutic exercise;Balance training;Neuromuscular re-education;Manual techniques;Patient/family education;Passive range of motion;Joint Manipulations    PT Next Visit Plan *immunocompromised status post SCT wipe equipment before use*, aerobic activity with walking/nustep/bike, general strengthening, include step up/stairs work    PT [place] currently    Consulted and Agree with Plan of Care Patient             Patient will benefit from skilled therapeutic intervention in order to improve the following deficits and impairments:  Abnormal gait, Decreased mobility, Decreased activity tolerance, Decreased endurance, Decreased strength, Decreased balance, Decreased knowledge of precautions, Difficulty walking, Postural dysfunction, Pain  Visit Diagnosis: Muscle weakness (generalized)  Multiple myeloma not having achieved remission ([place] Difficulty in walking, not elsewhere classified  Fatigue, unspecified type     Problem List Patient Active Problem List   Diagnosis Date Noted   Multiple myeloma ([facility] 07/13/2020   Claw toe, right    Trigger finger, left middle finger 04/09/2020   Stenosing tenosynovitis of finger of left hand 02/27/2020   Hemoglobin C trait ([facility]10/05/2017   Major depression, recurrent ([facility] [facility]08/16/2019   Chronic right shoulder pain 12/20/2017   MDD (major depressive disorder), recurrent episode,  moderate ([place]) 11/04/2017   MGUS (monoclonal gammopathy of unknown significance) 10/24/2017   Primary osteoarthritis of both hands 09/26/2017   Primary osteoarthritis of right shoulder 09/26/2017   Primary osteoarthritis of both hips 09/26/2017   Status post total knee replacement, right 09/26/2017   Primary osteoarthritis of both feet 09/26/2017   History of asthma 09/26/2017   Primary osteoarthritis of left knee 04/18/2017   Menorrhagia 11/04/2016   Fibroid tumor 11/04/2016   Adenomyosis 11/04/2016   Hypertension, essential 11/04/2016   Total knee replacement status 01/28/2016    Claris Pong, PT 02/25/2021, 8:50 AM  [place], [place], [address] Phone: [phone]   Fax:  [phone]  Name: Hannah [name] MRN: [id number] Date of Birth: 1961/04/18

## 2021-03-01 LAB — MULTIPLE MYELOMA PANEL, SERUM
Albumin SerPl Elph-Mcnc: 3.5 g/dL (ref 2.9–4.4)
Albumin/Glob SerPl: 1.5 (ref 0.7–1.7)
Alpha 1: 0.3 g/dL (ref 0.0–0.4)
Alpha2 Glob SerPl Elph-Mcnc: 0.5 g/dL (ref 0.4–1.0)
B-Globulin SerPl Elph-Mcnc: 0.9 g/dL (ref 0.7–1.3)
Gamma Glob SerPl Elph-Mcnc: 0.7 g/dL (ref 0.4–1.8)
Globulin, Total: 2.4 g/dL (ref 2.2–3.9)
IgA: 45 mg/dL — ABNORMAL LOW (ref 87–352)
IgG (Immunoglobin G), Serum: 722 mg/dL (ref 586–1602)
IgM (Immunoglobulin M), Srm: 6 mg/dL — ABNORMAL LOW (ref 26–217)
Total Protein ELP: 5.9 g/dL — ABNORMAL LOW (ref 6.0–8.5)

## 2021-03-03 ENCOUNTER — Ambulatory Visit: Payer: BC Managed Care – PPO

## 2021-03-05 ENCOUNTER — Ambulatory Visit: Payer: BC Managed Care – PPO | Attending: Family

## 2021-03-05 ENCOUNTER — Other Ambulatory Visit: Payer: Self-pay

## 2021-03-05 DIAGNOSIS — R2689 Other abnormalities of gait and mobility: Secondary | ICD-10-CM | POA: Diagnosis not present

## 2021-03-05 DIAGNOSIS — M25561 Pain in right knee: Secondary | ICD-10-CM | POA: Diagnosis not present

## 2021-03-05 DIAGNOSIS — M25652 Stiffness of left hip, not elsewhere classified: Secondary | ICD-10-CM | POA: Diagnosis not present

## 2021-03-05 DIAGNOSIS — R293 Abnormal posture: Secondary | ICD-10-CM | POA: Diagnosis not present

## 2021-03-05 DIAGNOSIS — R252 Cramp and spasm: Secondary | ICD-10-CM | POA: Diagnosis not present

## 2021-03-05 DIAGNOSIS — C9000 Multiple myeloma not having achieved remission: Secondary | ICD-10-CM | POA: Diagnosis not present

## 2021-03-05 DIAGNOSIS — R262 Difficulty in walking, not elsewhere classified: Secondary | ICD-10-CM | POA: Insufficient documentation

## 2021-03-05 DIAGNOSIS — R5383 Other fatigue: Secondary | ICD-10-CM | POA: Diagnosis not present

## 2021-03-05 DIAGNOSIS — G8929 Other chronic pain: Secondary | ICD-10-CM | POA: Diagnosis not present

## 2021-03-05 DIAGNOSIS — R2681 Unsteadiness on feet: Secondary | ICD-10-CM | POA: Diagnosis not present

## 2021-03-05 DIAGNOSIS — M5442 Lumbago with sciatica, left side: Secondary | ICD-10-CM | POA: Insufficient documentation

## 2021-03-05 DIAGNOSIS — M6281 Muscle weakness (generalized): Secondary | ICD-10-CM | POA: Insufficient documentation

## 2021-03-05 NOTE — Therapy (Signed)
[facility] @ [place], [place], [address] Phone: [phone]   Fax:  [phone]  Physical Therapy Treatment  Patient Details  Name: Hannah [name] MRN: [id number] Date of Birth: 1960/09/15 Referring Provider (PT): Cira Rue   Encounter Date: 03/05/2021   PT End of Session - 03/05/21 0856     Visit Number 14    Number of Visits 22    Date for PT Re-Evaluation 04/13/21    Authorization Type BCBS    PT Start Time 0800    PT Stop Time 0845    PT Time Calculation (min) 45 min             Past Medical History:  Diagnosis Date   Allergy    Anemia    Anxiety    Arthritis    Asthma    Depression    Hypertension     Past Surgical History:  Procedure Laterality Date   CHOLECYSTECTOMY  1993   COLONOSCOPY     TOTAL KNEE ARTHROPLASTY Right 01/28/2016   TOTAL KNEE ARTHROPLASTY Right 01/28/2016   Procedure: RIGHT TOTAL KNEE ARTHROPLASTY;  Surgeon: [name], MD;  Location: [facility];  Service: Orthopedics;  Laterality: Right;   TRIGGER FINGER RELEASE Right 06/18/2014   Procedure: RIGHT LONG FINGER TRIGGER RELEASE;  Surgeon: [name], MD;  Location: [facility];  Service: Orthopedics;  Laterality: Right;   TUBAL LIGATION     VAGINAL HYSTERECTOMY Bilateral 11/04/2016   Procedure: HYSTERECTOMY VAGINAL with Bilateral Salpingectomy;  Surgeon: [name], MD;  Location: [facility];  Service: Gynecology;  Laterality: Bilateral;   WEIL OSTEOTOMY Right 07/07/2020   Procedure: RIGHT FOOT WEIL OSTEOTOMY 2, 3, AND 4 METATARSALS AND PROXIMAL INTERPHALANGEAL JOINT FUSION 2 & 4 TOES;  Surgeon: Newt Minion, MD;  Location: [facility];  Service: Orthopedics;  Laterality: Right;    There were no vitals filed for this visit.   Subjective Assessment - 03/05/21 0800     Subjective I didn't make it earlier this week because I was achy and sore and not feeling well, but feel OK  today.. Did OK after last visit. I seem to have a little more energy.    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 She as diagnosed in February 2022 with Multiple Myeloma not in remission. 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 no limits    How long can you stand comfortably? 58mn    How long can you walk comfortably? 512m    Diagnostic tests no scans scheduled recently    Patient Stated Goals get strong, build up the muscles    Currently in Pain? Yes    Pain Score 3     Pain Location Buttocks    Pain Orientation Right    Pain Descriptors / Indicators Aching    Pain Type Acute pain    Pain Onset In the past 7 days    Pain Frequency Constant    Multiple Pain Sites No                               [facility] Adult PT Treatment/Exercise - 03/05/21 0001       Exercises   Other Exercises  Nu Step L1  x 3:68min. marching at back of bike x 30" able to do 10 on each leg, LAQ 2x7 bil, ball squeeze purple ball x 7, standing mini squat 2x 7, overhead press 2 x 5 (no shoulder pain Rt), heel raises 2 x 7, cervical ROM x 5 for rotation,gastroc stretch x 3 B in parallel bars, sit to stand x5, sidestepping in paralel bars 3 x 4 steps bilaterally      Shoulder Exercises: Seated   Row Strengthening;Both    Theraband Level (Shoulder Row) Level 1 (Yellow)    Row Limitations no pain    External Rotation Strengthening;Both;5 reps    Theraband Level (Shoulder External Rotation) Level 1 (Yellow)    External Rotation Limitations discomfort on right    Other Seated Exercises biceps curls 2 x 7 standing 2#                         PT Long Term Goals - 02/16/21 1346       PT LONG TERM GOAL #1   Title Pt will return to the gym    Time 8    Period Weeks    Status New      PT LONG TERM GOAL #2   Title Pt will improve 70min walk  test to at least 1026ft to demonstrate decreased fall risk    Baseline 643    Time 8    Period Weeks    Status New      PT LONG TERM GOAL #3   Title Pt will be able to perform a 5 x sts in 20 sec or less showing improved LE strength and    Baseline 24sec    Time 8    Period Weeks    Status New      PT LONG TERM GOAL #4   Title Pt will be able to go up and down stairs at home    Time 8    Period Weeks    Status New      PT LONG TERM GOAL #5   Title NA                   Plan - 03/05/21 0857     Clinical Impression Statement Pt feels sher energy levels are improving.  She was able to progress exercises slightly today, but did fatigue with sidestepping in parallel bars.  She was given rest breaks between exercises as needed.  She had sosme right buttock/back pain prior to starting therapy which was no worse afterwards.  She expressed interest in doing pool therapy and I will contact Janett Billow regarding this    Personal Factors and Comorbidities Comorbidity 3+    Comorbidities Multiple Myeloma, multi joint OA, recent foot surgery, fall risk,SCT    Examination-Activity Limitations Sleep;Squat;Stand;Stairs;Transfers;Locomotion Level;Sit    Examination-Participation Restrictions Cleaning;Community Activity;Occupation;Shop    Stability/Clinical Decision Making Stable/Uncomplicated    Rehab Potential Good    PT Frequency 2x / week    PT Duration 8 weeks    PT Treatment/Interventions ADLs/Self Care Home Management;Aquatic Therapy;Gait training;Stair training;Functional mobility training;Therapeutic activities;Therapeutic exercise;Balance training;Neuromuscular re-education;Manual techniques;Patient/family education;Passive range of motion;Joint Manipulations    PT Next Visit Plan *immunocompromised status post SCT wipe equipment before use*, aerobic activity with walking/nustep/bike, general strengthening, include step up/stairs work, consider pool therapy    Consulted and Agree with  Plan of Care Patient             Patient will benefit from  skilled therapeutic intervention in order to improve the following deficits and impairments:  Abnormal gait, Decreased mobility, Decreased activity tolerance, Decreased endurance, Decreased strength, Decreased balance, Decreased knowledge of precautions, Difficulty walking, Postural dysfunction, Pain  Visit Diagnosis: Muscle weakness (generalized)  Multiple myeloma not having achieved remission ([place])  Difficulty in walking, not elsewhere classified  Fatigue, unspecified type  Chronic low back pain with left-sided sciatica, unspecified back pain laterality  Stiffness of left hip, not elsewhere classified     Problem List Patient Active Problem List   Diagnosis Date Noted   Multiple myeloma ([place]) 07/13/2020   Claw toe, right    Trigger finger, left middle finger 04/09/2020   Stenosing tenosynovitis of finger of left hand 02/27/2020   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   Major depression, recurrent ([place]) 01/12/2018   Chronic right shoulder pain 12/20/2017   MDD (major depressive disorder), recurrent episode, moderate ([place]) 11/04/2017   MGUS (monoclonal gammopathy of unknown significance) 10/24/2017   Primary osteoarthritis of both hands 09/26/2017   Primary osteoarthritis of right shoulder 09/26/2017   Primary osteoarthritis of both hips 09/26/2017   Status post total knee replacement, right 09/26/2017   Primary osteoarthritis of both feet 09/26/2017   History of asthma 09/26/2017   Primary osteoarthritis of left knee 04/18/2017   Menorrhagia 11/04/2016   Fibroid tumor 11/04/2016   Adenomyosis 11/04/2016   Hypertension, essential 11/04/2016   Total knee replacement status 01/28/2016    Claris Pong, PT 03/05/2021, 9:01 AM  [facility] @ [place], [place], [address] Phone: [phone]   Fax:  [phone]  Name: Hannah [name] MRN: [id number] Date of Birth: [birth year]

## 2021-03-09 ENCOUNTER — Ambulatory Visit: Payer: BC Managed Care – PPO | Admitting: Rehabilitation

## 2021-03-09 ENCOUNTER — Encounter: Payer: Self-pay | Admitting: Rehabilitation

## 2021-03-09 ENCOUNTER — Other Ambulatory Visit: Payer: Self-pay

## 2021-03-09 DIAGNOSIS — M5442 Lumbago with sciatica, left side: Secondary | ICD-10-CM | POA: Diagnosis not present

## 2021-03-09 DIAGNOSIS — M25561 Pain in right knee: Secondary | ICD-10-CM | POA: Diagnosis not present

## 2021-03-09 DIAGNOSIS — R293 Abnormal posture: Secondary | ICD-10-CM | POA: Diagnosis not present

## 2021-03-09 DIAGNOSIS — R5383 Other fatigue: Secondary | ICD-10-CM

## 2021-03-09 DIAGNOSIS — M6281 Muscle weakness (generalized): Secondary | ICD-10-CM | POA: Diagnosis not present

## 2021-03-09 DIAGNOSIS — R2681 Unsteadiness on feet: Secondary | ICD-10-CM | POA: Diagnosis not present

## 2021-03-09 DIAGNOSIS — R2689 Other abnormalities of gait and mobility: Secondary | ICD-10-CM | POA: Diagnosis not present

## 2021-03-09 DIAGNOSIS — M25652 Stiffness of left hip, not elsewhere classified: Secondary | ICD-10-CM | POA: Diagnosis not present

## 2021-03-09 DIAGNOSIS — G8929 Other chronic pain: Secondary | ICD-10-CM | POA: Diagnosis not present

## 2021-03-09 DIAGNOSIS — R262 Difficulty in walking, not elsewhere classified: Secondary | ICD-10-CM

## 2021-03-09 DIAGNOSIS — C9000 Multiple myeloma not having achieved remission: Secondary | ICD-10-CM

## 2021-03-09 DIAGNOSIS — R252 Cramp and spasm: Secondary | ICD-10-CM | POA: Diagnosis not present

## 2021-03-09 NOTE — Therapy (Signed)
[facility] @ [place], [place], [address] Phone: [phone]   Fax:  [phone]  Physical Therapy Treatment  Patient Details  Name: Hannah [name] MRN: [id number] Date of Birth: 1961-05-23 Referring Provider (PT): Cira Rue   Encounter Date: 03/09/2021   PT End of Session - 03/09/21 0853     Visit Number 15    Number of Visits 22    Date for PT Re-Evaluation 04/13/21    PT Start Time 0811    PT Stop Time 0853    PT Time Calculation (min) 42 min    Activity Tolerance Patient tolerated treatment well    Behavior During Therapy [facility] for tasks assessed/performed             Past Medical History:  Diagnosis Date   Allergy    Anemia    Anxiety    Arthritis    Asthma    Depression    Hypertension     Past Surgical History:  Procedure Laterality Date   CHOLECYSTECTOMY  1993   COLONOSCOPY     TOTAL KNEE ARTHROPLASTY Right 01/28/2016   TOTAL KNEE ARTHROPLASTY Right 01/28/2016   Procedure: RIGHT TOTAL KNEE ARTHROPLASTY;  Surgeon: [name], MD;  Location: [facility];  Service: Orthopedics;  Laterality: Right;   TRIGGER FINGER RELEASE Right 06/18/2014   Procedure: RIGHT LONG FINGER TRIGGER RELEASE;  Surgeon: [name], MD;  Location: [facility];  Service: Orthopedics;  Laterality: Right;   TUBAL LIGATION     VAGINAL HYSTERECTOMY Bilateral 11/04/2016   Procedure: HYSTERECTOMY VAGINAL with Bilateral Salpingectomy;  Surgeon: [name], MD;  Location: [facility];  Service: Gynecology;  Laterality: Bilateral;   WEIL OSTEOTOMY Right 07/07/2020   Procedure: RIGHT FOOT WEIL OSTEOTOMY 2, 3, AND 4 METATARSALS AND PROXIMAL INTERPHALANGEAL JOINT FUSION 2 & 4 TOES;  Surgeon: Newt Minion, MD;  Location: [facility];  Service: Orthopedics;  Laterality: Right;    There were no vitals filed for this visit.   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 She as diagnosed in February 2022 with Multiple Myeloma not in remission. 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 Yes    Pain Score 2     Pain Location Shoulder    Pain Orientation Right    Pain Descriptors / Indicators Aching    Pain Type Chronic pain    Pain Onset More than a month ago    Pain Frequency Intermittent                               [facility] Adult PT Treatment/Exercise - 03/09/21 0001       Exercises   Other Exercises  Nu Step L1 x 3:26mn UE at #6. marching at back of bike x 30" able to do 13 on each leg, LAQ 2x7 bil, ball squeeze purple ball x 7, standing mini squat 2x 7, overhead press 2 x 5 (no shoulder pain Rt), wall push up 2x6, heel raises 2 x 7, cervical ROM x 5 for rotation,gastroc stretch x 3 B in parallel bars, sit to stand x 7, sidestepping in parallel bars 4 x 4 steps bilaterally  PT Long Term Goals - 02/16/21 1346       PT LONG TERM GOAL #1   Title Pt will return to the gym    Time 8    Period Weeks    Status New      PT LONG TERM GOAL #2   Title Pt will improve 16mn walk test to at least 10041fto demonstrate decreased fall risk    Baseline 643    Time 8    Period Weeks    Status New      PT LONG TERM GOAL #3   Title Pt will be able to perform a 5 x sts in 20 sec or less showing improved LE strength and    Baseline 24sec    Time 8    Period Weeks    Status New      PT LONG TERM GOAL #4   Title Pt will be able to go up and down stairs at home    Time 8    Period Weeks    Status New      PT LONG TERM GOAL #5   Title NA                   Plan - 03/09/21 0854     Clinical Impression Statement Pt much improved today since last visit with this PT.  Switched schedule around to add 1 pool and 1 land  based therapy per week.  Next week will only do 1 pool to see how tired she gets.  Discussed location of pool, gettin in with a W/C if needed or taking rest breaks and being able to use the family changing room.  Able to do 13 marches today vs 10 in 30 seconds.    PT Treatment/Interventions ADLs/Self Care Home Management;Aquatic Therapy;Gait training;Stair training;Functional mobility training;Therapeutic activities;Therapeutic exercise;Balance training;Neuromuscular re-education;Manual techniques;Patient/family education;Passive range of motion;Joint Manipulations    PT Next Visit Plan *immunocompromised status post SCT wipe equipment before use*, aerobic activity with walking/nustep/bike, general strengthening, include step up/stairs work, consider pool therapy    Consulted and Agree with Plan of Care Patient             Patient will benefit from skilled therapeutic intervention in order to improve the following deficits and impairments:     Visit Diagnosis: Muscle weakness (generalized)  Multiple myeloma not having achieved remission ([facility] Difficulty in walking, not elsewhere classified  Fatigue, unspecified type     Problem List Patient Active Problem List   Diagnosis Date Noted   Multiple myeloma ([facility]02/14/2022   Claw toe, right    Trigger finger, left middle finger 04/09/2020   Stenosing tenosynovitis of finger of left hand 02/27/2020   Hemoglobin C trait ([facility]10/05/2017   Major depression, recurrent ([facility]08/16/2019   Chronic right shoulder pain 12/20/2017   MDD (major depressive disorder), recurrent episode, moderate ([facility]06/12/2017   MGUS (monoclonal gammopathy of unknown significance) 10/24/2017   Primary osteoarthritis of both hands 09/26/2017   Primary osteoarthritis of right shoulder 09/26/2017   Primary osteoarthritis of both hips 09/26/2017   Status post total knee replacement, right 09/26/2017   Primary osteoarthritis of both feet 09/26/2017   History of  asthma 09/26/2017   Primary osteoarthritis of left knee 04/18/2017   Menorrhagia 11/04/2016   Fibroid tumor 11/04/2016   Adenomyosis 11/04/2016   Hypertension, essential 11/04/2016   Total knee replacement status 01/28/2016    TeStark BrayPT 03/09/2021, 8:57 AM  [facility] @ [place], [place], [address] Phone: [phone]   Fax:  [phone]  Name: TEAIRA CROFT MRN: [id number] Date of Birth: Sep 15, 1960

## 2021-03-11 ENCOUNTER — Other Ambulatory Visit: Payer: Self-pay

## 2021-03-11 ENCOUNTER — Ambulatory Visit: Payer: BC Managed Care – PPO

## 2021-03-11 DIAGNOSIS — R293 Abnormal posture: Secondary | ICD-10-CM | POA: Diagnosis not present

## 2021-03-11 DIAGNOSIS — R262 Difficulty in walking, not elsewhere classified: Secondary | ICD-10-CM

## 2021-03-11 DIAGNOSIS — R252 Cramp and spasm: Secondary | ICD-10-CM | POA: Diagnosis not present

## 2021-03-11 DIAGNOSIS — G8929 Other chronic pain: Secondary | ICD-10-CM | POA: Diagnosis not present

## 2021-03-11 DIAGNOSIS — R5383 Other fatigue: Secondary | ICD-10-CM

## 2021-03-11 DIAGNOSIS — M25652 Stiffness of left hip, not elsewhere classified: Secondary | ICD-10-CM | POA: Diagnosis not present

## 2021-03-11 DIAGNOSIS — M6281 Muscle weakness (generalized): Secondary | ICD-10-CM | POA: Diagnosis not present

## 2021-03-11 DIAGNOSIS — R2681 Unsteadiness on feet: Secondary | ICD-10-CM | POA: Diagnosis not present

## 2021-03-11 DIAGNOSIS — R2689 Other abnormalities of gait and mobility: Secondary | ICD-10-CM | POA: Diagnosis not present

## 2021-03-11 DIAGNOSIS — C9000 Multiple myeloma not having achieved remission: Secondary | ICD-10-CM

## 2021-03-11 DIAGNOSIS — M25561 Pain in right knee: Secondary | ICD-10-CM | POA: Diagnosis not present

## 2021-03-11 DIAGNOSIS — M5442 Lumbago with sciatica, left side: Secondary | ICD-10-CM | POA: Diagnosis not present

## 2021-03-11 NOTE — Therapy (Signed)
[facility] @ [place], [place], [address] Phone: [phone]   Fax:  [phone]  Physical Therapy Treatment  Patient Details  Name: Hannah [name] MRN: [id number] Date of Birth: [birth year] Referring Provider (PT): Cira Rue   Encounter Date: 03/11/2021   PT End of Session - 03/11/21 0951     Visit Number 16    Number of Visits 22    Date for PT Re-Evaluation 04/13/21    Authorization Type BCBS    PT Start Time 0903    PT Stop Time 0945    PT Time Calculation (min) 42 min    Activity Tolerance Patient tolerated treatment well    Behavior During Therapy [facility] for tasks assessed/performed             Past Medical History:  Diagnosis Date   Allergy    Anemia    Anxiety    Arthritis    Asthma    Depression    Hypertension     Past Surgical History:  Procedure Laterality Date   CHOLECYSTECTOMY  1993   COLONOSCOPY     TOTAL KNEE ARTHROPLASTY Right 01/28/2016   TOTAL KNEE ARTHROPLASTY Right 01/28/2016   Procedure: RIGHT TOTAL KNEE ARTHROPLASTY;  Surgeon: [name], MD;  Location: [facility];  Service: Orthopedics;  Laterality: Right;   TRIGGER FINGER RELEASE Right 06/18/2014   Procedure: RIGHT LONG FINGER TRIGGER RELEASE;  Surgeon: [name], MD;  Location: [facility];  Service: Orthopedics;  Laterality: Right;   TUBAL LIGATION     VAGINAL HYSTERECTOMY Bilateral 11/04/2016   Procedure: HYSTERECTOMY VAGINAL with Bilateral Salpingectomy;  Surgeon: [name], MD;  Location: [facility];  Service: Gynecology;  Laterality: Bilateral;   WEIL OSTEOTOMY Right 07/07/2020   Procedure: RIGHT FOOT WEIL OSTEOTOMY 2, 3, AND 4 METATARSALS AND PROXIMAL INTERPHALANGEAL JOINT FUSION 2 & 4 TOES;  Surgeon: Newt Minion, MD;  Location: [facility];  Service: Orthopedics;  Laterality: Right;    There were no vitals filed for this visit.   Subjective Assessment - 03/11/21 0904      Subjective I was tired after last visit, but not as bad. I rested alot the next day.    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 She as diagnosed in February 2022 with Multiple Myeloma not in remission. 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 no limits    How long can you stand comfortably? 32mn    How long can you walk comfortably? 542m    Diagnostic tests no scans scheduled recently    Patient Stated Goals get strong, build up the muscles    Currently in Pain? Yes    Pain Score 2     Pain Location Buttocks    Pain Orientation Right;Left    Pain Descriptors / Indicators Aching    Multiple Pain Sites Yes    Pain Score 6    Pain Location Shoulder    Pain Orientation Right    Pain Descriptors / Indicators Aching    Pain Type Chronic pain    Pain Onset More than a month ago    Pain Frequency Constant    Aggravating Factors  rainy weather, not moving.    Pain Relieving Factors movement to keep it loose  [facility] Adult PT Treatment/Exercise - 03/11/21 0001       Exercises   Other Exercises  Nu Step L1 x 4 min UE at #6. step onto ax pad x 10 right and 10 left in parallel bars, seated scapular retraction yellow, 2 x 10, marching at back of bike x 30" able to do 11 on each leg,, ball squeeze purple ball x 10, standing mini squat 2x 7, overhead press 2 x 5 (no increased shoulder pain Rt),  heel raises 2 x 10, gastroc stretch x 3 B in parallel bars, , sidestepping in parallel bars 4 x 4 steps bilaterally,ppt x 10 with 5 sec hold, supine piriformis stretch x 3 bilaterally.                          PT Long Term Goals - 02/16/21 1346       PT LONG TERM GOAL #1   Title Pt will return to the gym    Time 8    Period Weeks    Status New      PT LONG TERM GOAL #2   Title Pt will  improve 64mn walk test to at least 1003fto demonstrate decreased fall risk    Baseline 643    Time 8    Period Weeks    Status New      PT LONG TERM GOAL #3   Title Pt will be able to perform a 5 x sts in 20 sec or less showing improved LE strength and    Baseline 24sec    Time 8    Period Weeks    Status New      PT LONG TERM GOAL #4   Title Pt will be able to go up and down stairs at home    Time 8    Period Weeks    Status New      PT LONG TERM GOAL #5   Title NA                   Plan - 03/11/21 0927     Clinical Impression Statement Pt feeling less fatigued after sessions, but still requires more rest the next day..  Add ppt and piriformis stretching at completion of exs secondary to pt very tight there    Personal Factors and Comorbidities Comorbidity 3+    Comorbidities Multiple Myeloma, multi joint OA, recent foot surgery, fall risk,SCT    Examination-Activity Limitations Sleep;Squat;Stand;Stairs;Transfers;Locomotion Level;Sit    Stability/Clinical Decision Making Stable/Uncomplicated    Rehab Potential Good    PT Frequency 2x / week    PT Duration 8 weeks    PT Treatment/Interventions ADLs/Self Care Home Management;Aquatic Therapy;Gait training;Stair training;Functional mobility training;Therapeutic activities;Therapeutic exercise;Balance training;Neuromuscular re-education;Manual techniques;Patient/family education;Passive range of motion;Joint Manipulations    PT Next Visit Plan *immunocompromised status post SCT wipe equipment before use*, aerobic activity with walking/nustep/bike, general strengthening, include step up/stairs work, consider pool therapy    PT Home Exercise Plan none currently    Consulted and Agree with Plan of Care Patient             Patient will benefit from skilled therapeutic intervention in order to improve the following deficits and impairments:  Abnormal gait, Decreased mobility, Decreased activity tolerance, Decreased  endurance, Decreased strength, Decreased balance, Decreased knowledge of precautions, Difficulty walking, Postural dysfunction, Pain  Visit Diagnosis: Muscle weakness (generalized)  Multiple myeloma not having achieved remission ([facility])  Difficulty in  walking, not elsewhere classified  Fatigue, unspecified type     Problem List Patient Active Problem List   Diagnosis Date Noted   Multiple myeloma ([place]) 07/13/2020   Claw toe, right    Trigger finger, left middle finger 04/09/2020   Stenosing tenosynovitis of finger of left hand 02/27/2020   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   Major depression, recurrent ([place]) 01/12/2018   Chronic right shoulder pain 12/20/2017   MDD (major depressive disorder), recurrent episode, moderate ([place]) 11/04/2017   MGUS (monoclonal gammopathy of unknown significance) 10/24/2017   Primary osteoarthritis of both hands 09/26/2017   Primary osteoarthritis of right shoulder 09/26/2017   Primary osteoarthritis of both hips 09/26/2017   Status post total knee replacement, right 09/26/2017   Primary osteoarthritis of both feet 09/26/2017   History of asthma 09/26/2017   Primary osteoarthritis of left knee 04/18/2017   Menorrhagia 11/04/2016   Fibroid tumor 11/04/2016   Adenomyosis 11/04/2016   Hypertension, essential 11/04/2016   Total knee replacement status 01/28/2016    Claris Pong, PT 03/11/2021, 9:53 AM  [facility] @ [place], [place], [address] Phone: [phone]   Fax:  [phone]  Name: Hannah [name] MRN: [id number] Date of Birth: [birth year]

## 2021-03-16 ENCOUNTER — Encounter: Payer: BC Managed Care – PPO | Admitting: Rehabilitation

## 2021-03-17 ENCOUNTER — Encounter: Payer: Self-pay | Admitting: Physical Therapy

## 2021-03-17 ENCOUNTER — Other Ambulatory Visit: Payer: Self-pay

## 2021-03-17 ENCOUNTER — Ambulatory Visit: Payer: BC Managed Care – PPO | Admitting: Physical Therapy

## 2021-03-17 DIAGNOSIS — C9000 Multiple myeloma not having achieved remission: Secondary | ICD-10-CM

## 2021-03-17 DIAGNOSIS — R262 Difficulty in walking, not elsewhere classified: Secondary | ICD-10-CM

## 2021-03-17 DIAGNOSIS — G8929 Other chronic pain: Secondary | ICD-10-CM

## 2021-03-17 DIAGNOSIS — R293 Abnormal posture: Secondary | ICD-10-CM | POA: Diagnosis not present

## 2021-03-17 DIAGNOSIS — M25652 Stiffness of left hip, not elsewhere classified: Secondary | ICD-10-CM | POA: Diagnosis not present

## 2021-03-17 DIAGNOSIS — M5442 Lumbago with sciatica, left side: Secondary | ICD-10-CM | POA: Diagnosis not present

## 2021-03-17 DIAGNOSIS — R252 Cramp and spasm: Secondary | ICD-10-CM | POA: Diagnosis not present

## 2021-03-17 DIAGNOSIS — R2689 Other abnormalities of gait and mobility: Secondary | ICD-10-CM | POA: Diagnosis not present

## 2021-03-17 DIAGNOSIS — M25561 Pain in right knee: Secondary | ICD-10-CM | POA: Diagnosis not present

## 2021-03-17 DIAGNOSIS — R5383 Other fatigue: Secondary | ICD-10-CM

## 2021-03-17 DIAGNOSIS — M6281 Muscle weakness (generalized): Secondary | ICD-10-CM | POA: Diagnosis not present

## 2021-03-17 DIAGNOSIS — R2681 Unsteadiness on feet: Secondary | ICD-10-CM | POA: Diagnosis not present

## 2021-03-17 NOTE — Therapy (Signed)
[facility] @ [place], [place], [address] Phone: [phone]   Fax:  [phone]  Physical Therapy Treatment  Patient Details  Name: Hannah [name] MRN: [id number] Date of Birth: 1960-11-26 Referring Provider (PT): Cira Rue   Encounter Date: 03/17/2021   PT End of Session - 03/17/21 0923     Visit Number 17    Number of Visits 22    Date for PT Re-Evaluation 04/13/21    Authorization Type BC[employer]    PT Start Time 0803    PT Stop Time 0845    PT Time Calculation (min) 42 min    Activity Tolerance Patient tolerated treatment well    Behavior During Therapy [facility] for tasks assessed/performed             Past Medical History:  Diagnosis Date   Allergy    Anemia    Anxiety    Arthritis    Asthma    Depression    Hypertension     Past Surgical History:  Procedure Laterality Date   CHOLECYSTECTOMY  1993   COLONOSCOPY     TOTAL KNEE ARTHROPLASTY Right 01/28/2016   TOTAL KNEE ARTHROPLASTY Right 01/28/2016   Procedure: RIGHT TOTAL KNEE ARTHROPLASTY;  Surgeon: [name], MD;  Location: [facility];  Service: Orthopedics;  Laterality: Right;   TRIGGER FINGER RELEASE Right 06/18/2014   Procedure: RIGHT LONG FINGER TRIGGER RELEASE;  Surgeon: [name], MD;  Location: [place];  Service: Orthopedics;  Laterality: Right;   TUBAL LIGATION     VAGINAL HYSTERECTOMY Bilateral 11/04/2016   Procedure: HYSTERECTOMY VAGINAL with Bilateral Salpingectomy;  Surgeon: [name], MD;  Location: [facility];  Service: Gynecology;  Laterality: Bilateral;   WEIL OSTEOTOMY Right 07/07/2020   Procedure: RIGHT FOOT WEIL OSTEOTOMY 2, 3, AND 4 METATARSALS AND PROXIMAL INTERPHALANGEAL JOINT FUSION 2 & 4 TOES;  Surgeon: Newt Minion, MD;  Location: [facility];  Service: Orthopedics;  Laterality: Right;    There were no vitals filed for this visit.   Subjective Assessment - 03/17/21 1042      Subjective I am doing ok this AM. I am excited about being in the water, I love the water.    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 She as diagnosed in February 2022 with Multiple Myeloma not in remission. 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 --   General body aches this AM, but no specific pain.           Treatment: Patient seen for aquatic therapy today.  Treatment took place in water 2.5-4 feet deep depending upon activity.  Pt entered the pool via stairs, step to step with moderate use of hand rails. Pt requires buoyancy of water for support and to offload joints with strengthening exercises.  Water temp 94 degrees F.   Seated water bench with 75% submersion Pt performed seated LE AROM exercises 20x in all planes, pt educated in water principles with concurrent discussion of current status. Yellow UE weights for horizontal shoulder /abd/add 10x: Vc to move gently secondary to Rt shoulder.   75% depth: water walking 4 lengths side to side with large noodle for postural support. All 4 directions.  High knee marching 2 lengths with large noodle.  Back against the wall abdominal compressions with  nekdoodle; 3 sec hold 10x                            PT Education - 03/17/21 1044     Education Details Water principles and how we will use them    Person(s) Educated Patient    Methods Explanation    Comprehension Verbalized understanding                 PT Long Term Goals - 02/16/21 1346       PT LONG TERM GOAL #1   Title Pt will return to the gym    Time 8    Period Weeks    Status New      PT LONG TERM GOAL #2   Title Pt will improve 23mn walk test to at least 10035fto demonstrate decreased fall risk    Baseline 643    Time 8    Period Weeks    Status New      PT LONG TERM GOAL #3   Title Pt will be  able to perform a 5 x sts in 20 sec or less showing improved LE strength and    Baseline 24sec    Time 8    Period Weeks    Status New      PT LONG TERM GOAL #4   Title Pt will be able to go up and down stairs at home    Time 8    Period Weeks    Status New      PT LONG TERM GOAL #5   Title NA                   Plan - 03/17/21 1044     Clinical Impression Statement Pt arrives for first aquatic session with this new diagnosis, she has previous water exercise/Pt experience. Pt was educated in water principles and how we would be using them. Pt tolerated most of the session standing to perform her exercises with intermittent rest breaks leaning against the wall. Pt did require 1 seated break as her postural muscles were beginning to fatigue. No increases in pain reported throughout treatment.    Personal Factors and Comorbidities Comorbidity 3+    Comorbidities Multiple Myeloma, multi joint OA, recent foot surgery, fall risk,SCT    Examination-Activity Limitations Sleep;Squat;Stand;Stairs;Transfers;Locomotion Level;Sit    Examination-Participation Restrictions Cleaning;Community Activity;Occupation;Shop    Stability/Clinical Decision Making Stable/Uncomplicated    Rehab Potential Good    PT Frequency 2x / week    PT Duration 8 weeks    PT Treatment/Interventions ADLs/Self Care Home Management;Aquatic Therapy;Gait training;Stair training;Functional mobility training;Therapeutic activities;Therapeutic exercise;Balance training;Neuromuscular re-education;Manual techniques;Patient/family education;Passive range of motion;Joint Manipulations    PT Next Visit Plan See how pt felt post first aquatic session    Consulted and Agree with Plan of Care Patient             Patient will benefit from skilled therapeutic intervention in order to improve the following deficits and impairments:  Abnormal gait, Decreased mobility, Decreased activity tolerance, Decreased endurance, Decreased  strength, Decreased balance, Decreased knowledge of precautions, Difficulty walking, Postural dysfunction, Pain  Visit Diagnosis: Muscle weakness (generalized)  Multiple myeloma not having achieved remission (HCC)  Fatigue, unspecified type  Difficulty in walking, not elsewhere classified  Chronic low back pain with left-sided sciatica, unspecified back pain laterality  Cramp and spasm  Stiffness of left hip, not elsewhere classified  Problem List Patient Active Problem List   Diagnosis Date Noted   Multiple myeloma ([place]) 07/13/2020   Claw toe, right    Trigger finger, left middle finger 04/09/2020   Stenosing tenosynovitis of finger of left hand 02/27/2020   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   Major depression, recurrent ([place]) 01/12/2018   Chronic right shoulder pain 12/20/2017   MDD (major depressive disorder), recurrent episode, moderate ([place]) 11/04/2017   MGUS (monoclonal gammopathy of unknown significance) 10/24/2017   Primary osteoarthritis of both hands 09/26/2017   Primary osteoarthritis of right shoulder 09/26/2017   Primary osteoarthritis of both hips 09/26/2017   Status post total knee replacement, right 09/26/2017   Primary osteoarthritis of both feet 09/26/2017   History of asthma 09/26/2017   Primary osteoarthritis of left knee 04/18/2017   Menorrhagia 11/04/2016   Fibroid tumor 11/04/2016   Adenomyosis 11/04/2016   Hypertension, essential 11/04/2016   Total knee replacement status 01/28/2016    Arlena Marsan, PTA 03/17/2021, 10:48 AM  [facility] @ [place], [place], [address] Phone: [phone]   Fax:  [phone]  Name: Hannah [name] MRN: [id number] Date of Birth: 14-Mar-1961

## 2021-03-22 ENCOUNTER — Encounter: Payer: Self-pay | Admitting: Rehabilitation

## 2021-03-22 ENCOUNTER — Other Ambulatory Visit: Payer: Self-pay

## 2021-03-22 ENCOUNTER — Ambulatory Visit: Payer: BC Managed Care – PPO | Admitting: Rehabilitation

## 2021-03-22 DIAGNOSIS — M5442 Lumbago with sciatica, left side: Secondary | ICD-10-CM | POA: Diagnosis not present

## 2021-03-22 DIAGNOSIS — M25561 Pain in right knee: Secondary | ICD-10-CM | POA: Diagnosis not present

## 2021-03-22 DIAGNOSIS — R293 Abnormal posture: Secondary | ICD-10-CM | POA: Diagnosis not present

## 2021-03-22 DIAGNOSIS — G8929 Other chronic pain: Secondary | ICD-10-CM | POA: Diagnosis not present

## 2021-03-22 DIAGNOSIS — M6281 Muscle weakness (generalized): Secondary | ICD-10-CM | POA: Diagnosis not present

## 2021-03-22 DIAGNOSIS — M25652 Stiffness of left hip, not elsewhere classified: Secondary | ICD-10-CM | POA: Diagnosis not present

## 2021-03-22 DIAGNOSIS — R2681 Unsteadiness on feet: Secondary | ICD-10-CM | POA: Diagnosis not present

## 2021-03-22 DIAGNOSIS — R2689 Other abnormalities of gait and mobility: Secondary | ICD-10-CM | POA: Diagnosis not present

## 2021-03-22 DIAGNOSIS — R5383 Other fatigue: Secondary | ICD-10-CM

## 2021-03-22 DIAGNOSIS — C9000 Multiple myeloma not having achieved remission: Secondary | ICD-10-CM | POA: Diagnosis not present

## 2021-03-22 DIAGNOSIS — R262 Difficulty in walking, not elsewhere classified: Secondary | ICD-10-CM | POA: Diagnosis not present

## 2021-03-22 DIAGNOSIS — R252 Cramp and spasm: Secondary | ICD-10-CM | POA: Diagnosis not present

## 2021-03-22 NOTE — Therapy (Signed)
[facility] @ [place], [place], [address] Phone: [phone]   Fax:  [phone]  Physical Therapy Treatment  Patient Details  Name: Hannah [name] MRN: [id number] Date of Birth: January 15, 1961 Referring Provider (PT): Cira Rue   Encounter Date: 03/22/2021   PT End of Session - 03/22/21 0844     Visit Number 18    Number of Visits 22    Date for PT Re-Evaluation 04/13/21    PT Start Time 0800    PT Stop Time 0845    PT Time Calculation (min) 45 min    Activity Tolerance Patient tolerated treatment well    Behavior During Therapy [facility] for tasks assessed/performed             Past Medical History:  Diagnosis Date   Allergy    Anemia    Anxiety    Arthritis    Asthma    Depression    Hypertension     Past Surgical History:  Procedure Laterality Date   CHOLECYSTECTOMY  1993   COLONOSCOPY     TOTAL KNEE ARTHROPLASTY Right 01/28/2016   TOTAL KNEE ARTHROPLASTY Right 01/28/2016   Procedure: RIGHT TOTAL KNEE ARTHROPLASTY;  Surgeon: [name], MD;  Location: [facility];  Service: Orthopedics;  Laterality: Right;   TRIGGER FINGER RELEASE Right 06/18/2014   Procedure: RIGHT LONG FINGER TRIGGER RELEASE;  Surgeon: [name], MD;  Location: [place];  Service: Orthopedics;  Laterality: Right;   TUBAL LIGATION     VAGINAL HYSTERECTOMY Bilateral 11/04/2016   Procedure: HYSTERECTOMY VAGINAL with Bilateral Salpingectomy;  Surgeon: [name], MD;  Location: [facility];  Service: Gynecology;  Laterality: Bilateral;   WEIL OSTEOTOMY Right 07/07/2020   Procedure: RIGHT FOOT WEIL OSTEOTOMY 2, 3, AND 4 METATARSALS AND PROXIMAL INTERPHALANGEAL JOINT FUSION 2 & 4 TOES;  Surgeon: Newt Minion, MD;  Location: [facility];  Service: Orthopedics;  Laterality: Right;    There were no vitals filed for this visit.   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 She as diagnosed in February 2022 with Multiple Myeloma not in remission. 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 No/denies                               [facility] Adult PT Treatment/Exercise - 03/22/21 0001       Exercises   Other Exercises  Nu Step L1 x 5 min UE at #6. step onto ax pad x 10 right and 10 left in parallel bars, lateral step ups Rt/Lt lead x 10 each, seated scapular retraction yellow, 2 x 10, marching at back of bike 2  x 30" able to do 11 on each leg,, ball squeeze purple ball x 10, standing mini squat 2x 7,  wall push up 2x6, heel raises 2 x 10, gastroc stretch x 3 B in parallel bars, , sidestepping out of bars 2x10 steps bilaterally, bicep curls 2# 2x7                          PT Long Term Goals - 02/16/21 1346  PT LONG TERM GOAL #1   Title Pt will return to the gym    Time 8    Period Weeks    Status New      PT LONG TERM GOAL #2   Title Pt will improve 58mn walk test to at least 10027fto demonstrate decreased fall risk    Baseline 643    Time 8    Period Weeks    Status New      PT LONG TERM GOAL #3   Title Pt will be able to perform a 5 x sts in 20 sec or less showing improved LE strength and    Baseline 24sec    Time 8    Period Weeks    Status New      PT LONG TERM GOAL #4   Title Pt will be able to go up and down stairs at home    Time 8    Period Weeks    Status New      PT LONG TERM GOAL #5   Title NA                   Plan - 03/22/21 0845     Clinical Impression Statement Pt is doing very well.  Able to progress sets.  Also loved the swimming pool and is feeling well overall.    PT Frequency 2x / week    PT Duration 8 weeks    PT Treatment/Interventions ADLs/Self Care Home Management;Aquatic Therapy;Gait  training;Stair training;Functional mobility training;Therapeutic activities;Therapeutic exercise;Balance training;Neuromuscular re-education;Manual techniques;Patient/family education;Passive range of motion;Joint Manipulations    PT Next Visit Plan cont conditioning and strength post SCT    Consulted and Agree with Plan of Care Patient             Patient will benefit from skilled therapeutic intervention in order to improve the following deficits and impairments:     Visit Diagnosis: Muscle weakness (generalized)  Multiple myeloma not having achieved remission (HCC)  Fatigue, unspecified type  Difficulty in walking, not elsewhere classified     Problem List Patient Active Problem List   Diagnosis Date Noted   Multiple myeloma ([facility]02/14/2022   Claw toe, right    Trigger finger, left middle finger 04/09/2020   Stenosing tenosynovitis of finger of left hand 02/27/2020   Hemoglobin C trait ([facility]10/05/2017   Major depression, recurrent ([facility]08/16/2019   Chronic right shoulder pain 12/20/2017   MDD (major depressive disorder), recurrent episode, moderate ([facility]06/12/2017   MGUS (monoclonal gammopathy of unknown significance) 10/24/2017   Primary osteoarthritis of both hands 09/26/2017   Primary osteoarthritis of right shoulder 09/26/2017   Primary osteoarthritis of both hips 09/26/2017   Status post total knee replacement, right 09/26/2017   Primary osteoarthritis of both feet 09/26/2017   History of asthma 09/26/2017   Primary osteoarthritis of left knee 04/18/2017   Menorrhagia 11/04/2016   Fibroid tumor 11/04/2016   Adenomyosis 11/04/2016   Hypertension, essential 11/04/2016   Total knee replacement status 01/28/2016    TeStark BrayPT 03/22/2021, 8:57 AM  [id number]: [phone] Fax:  [phone]: JoKENIDEE CREGANRN: [id number] of  Birth: 8/July 15, 1960

## 2021-03-24 ENCOUNTER — Ambulatory Visit (HOSPITAL_BASED_OUTPATIENT_CLINIC_OR_DEPARTMENT_OTHER): Payer: BC Managed Care – PPO | Attending: Orthopaedic Surgery | Admitting: Physical Therapy

## 2021-03-24 ENCOUNTER — Ambulatory Visit: Payer: BC Managed Care – PPO | Admitting: Physical Therapy

## 2021-03-24 ENCOUNTER — Other Ambulatory Visit: Payer: Self-pay

## 2021-03-24 ENCOUNTER — Encounter (HOSPITAL_BASED_OUTPATIENT_CLINIC_OR_DEPARTMENT_OTHER): Payer: Self-pay | Admitting: Physical Therapy

## 2021-03-24 DIAGNOSIS — M6281 Muscle weakness (generalized): Secondary | ICD-10-CM | POA: Diagnosis not present

## 2021-03-24 DIAGNOSIS — G8929 Other chronic pain: Secondary | ICD-10-CM

## 2021-03-24 DIAGNOSIS — M25561 Pain in right knee: Secondary | ICD-10-CM | POA: Insufficient documentation

## 2021-03-24 DIAGNOSIS — C9000 Multiple myeloma not having achieved remission: Secondary | ICD-10-CM | POA: Diagnosis not present

## 2021-03-24 DIAGNOSIS — R262 Difficulty in walking, not elsewhere classified: Secondary | ICD-10-CM | POA: Diagnosis not present

## 2021-03-24 DIAGNOSIS — R293 Abnormal posture: Secondary | ICD-10-CM

## 2021-03-24 DIAGNOSIS — M5442 Lumbago with sciatica, left side: Secondary | ICD-10-CM | POA: Diagnosis not present

## 2021-03-24 DIAGNOSIS — R2689 Other abnormalities of gait and mobility: Secondary | ICD-10-CM | POA: Diagnosis not present

## 2021-03-24 DIAGNOSIS — R5383 Other fatigue: Secondary | ICD-10-CM | POA: Diagnosis not present

## 2021-03-24 DIAGNOSIS — R252 Cramp and spasm: Secondary | ICD-10-CM | POA: Diagnosis not present

## 2021-03-24 DIAGNOSIS — R2681 Unsteadiness on feet: Secondary | ICD-10-CM

## 2021-03-24 DIAGNOSIS — M25652 Stiffness of left hip, not elsewhere classified: Secondary | ICD-10-CM | POA: Diagnosis not present

## 2021-03-24 NOTE — Therapy (Signed)
[facility] [place], [place], [address] Phone: [phone]   Fax:  [phone]  Physical Therapy Treatment  Patient Details  Name: Hannah [name] MRN: [id number] Date of Birth: [birth year] Referring Provider (PT): Cira Rue   Encounter Date: 03/24/2021   PT End of Session - 03/24/21 1635     Visit Number 19    Number of Visits 22    Date for PT Re-Evaluation 04/13/21    PT Start Time 1631    PT Stop Time 0388    PT Time Calculation (min) 44 min    Activity Tolerance Patient tolerated treatment well    Behavior During Therapy [facility] for tasks assessed/performed             Past Medical History:  Diagnosis Date   Allergy    Anemia    Anxiety    Arthritis    Asthma    Depression    Hypertension     Past Surgical History:  Procedure Laterality Date   CHOLECYSTECTOMY  1993   COLONOSCOPY     TOTAL KNEE ARTHROPLASTY Right 01/28/2016   TOTAL KNEE ARTHROPLASTY Right 01/28/2016   Procedure: RIGHT TOTAL KNEE ARTHROPLASTY;  Surgeon: [name], MD;  Location: [facility];  Service: Orthopedics;  Laterality: Right;   TRIGGER FINGER RELEASE Right 06/18/2014   Procedure: RIGHT LONG FINGER TRIGGER RELEASE;  Surgeon: [name], MD;  Location: [facility];  Service: Orthopedics;  Laterality: Right;   TUBAL LIGATION     VAGINAL HYSTERECTOMY Bilateral 11/04/2016   Procedure: HYSTERECTOMY VAGINAL with Bilateral Salpingectomy;  Surgeon: [name], MD;  Location: [facility];  Service: Gynecology;  Laterality: Bilateral;   WEIL OSTEOTOMY Right 07/07/2020   Procedure: RIGHT FOOT WEIL OSTEOTOMY 2, 3, AND 4 METATARSALS AND PROXIMAL INTERPHALANGEAL JOINT FUSION 2 & 4 TOES;  Surgeon: Newt Minion, MD;  Location: [facility];  Service: Orthopedics;  Laterality: Right;    There were no vitals filed for this visit.   Subjective Assessment - 03/24/21 1723     Subjective I'm tired today  didn't get a chance to take my nap"    Currently in Pain? Yes    Pain Score 1     Pain Location Buttocks    Pain Orientation Left    Pain Descriptors / Indicators Aching    Pain Type Chronic pain    Pain Onset More than a month ago    Pain Frequency Intermittent    Pain Score 6    Pain Location Shoulder    Pain Orientation Right    Pain Descriptors / Indicators Aching    Pain Onset More than a month ago    Pain Frequency Constant                                             PT Long Term Goals - 02/16/21 1346       PT LONG TERM GOAL #1   Title Pt will return to the gym    Time 8    Period Weeks    Status New      PT LONG TERM GOAL #2   Title Pt will improve 33min walk test to at least 1068ft to demonstrate decreased fall risk    Baseline 643    Time 8    Period  Weeks    Status New      PT LONG TERM GOAL #3   Title Pt will be able to perform a 5 x sts in 20 sec or less showing improved LE strength and    Baseline 24sec    Time 8    Period Weeks    Status New      PT LONG TERM GOAL #4   Title Pt will be able to go up and down stairs at home    Time 8    Period Weeks    Status New      PT LONG TERM GOAL #5   Title NA            Treatment: Patient seen for aquatic therapy today.  Treatment took place in water 2.5-4 feet deep depending upon activity.  Pt entered the pool via stairs, step to step with moderate use of hand rails. Pt requires buoyancy of water for support and to offload joints with strengthening exercises.  Water temp 94 degrees F.   Warm up: water walking 4-6 widths forward, backward and side stepping.   Seated water bench with 75% submersion Yellow UE weights for horizontal shoulder /abd/add 20x -flutter kicking at hip 2x20     75% depth: With 1 foam hand buoy: shoulder flex and add/abd x 10 R/L Step ups R/L x 10. No UE support Side Stepping R/L x 10.  VC for weight shift and core control for balance. No ue  support Walking supported by 2 foam (yellow) hand buoys 4 widths cues for increased step length.  50% depth: Kick board push downs 2 x 15. Cues for tightened abs and glute for postural control and core strengthening Kick board rotations R/L x10. Cues for increasing/decreasing resistance, varying the depth board submerged and/or speed. Completed to toleration       Plan - 03/24/21 1725     Clinical Impression Statement Completed varying exercises engaging upper and lower extremitires as well as core. Some c/o minor left lb/butock discomfort although states the core rotations felt good. Pt cued throughout to rest as needed.  She tolerates all challenges well with good execution and great attitude.    Stability/Clinical Decision Making Stable/Uncomplicated    Clinical Decision Making Low    Rehab Potential Good    PT Frequency 2x / week    PT Duration 8 weeks    PT Treatment/Interventions ADLs/Self Care Home Management;Aquatic Therapy;Gait training;Stair training;Functional mobility training;Therapeutic activities;Therapeutic exercise;Balance training;Neuromuscular re-education;Manual techniques;Patient/family education;Passive range of motion;Joint Manipulations    PT Next Visit Plan cont conditioning and strength post SCT             Patient will benefit from skilled therapeutic intervention in order to improve the following deficits and impairments:  Abnormal gait, Decreased mobility, Decreased activity tolerance, Decreased endurance, Decreased strength, Decreased balance, Decreased knowledge of precautions, Difficulty walking, Postural dysfunction, Pain  Visit Diagnosis: Abnormal posture  Chronic pain of right knee  Difficulty in walking, not elsewhere classified  Muscle weakness (generalized)  Unsteadiness on feet  Other abnormalities of gait and mobility     Problem List Patient Active Problem List   Diagnosis Date Noted   Multiple myeloma ([place]) 07/13/2020   Claw  toe, right    Trigger finger, left middle finger 04/09/2020   Stenosing tenosynovitis of finger of left hand 02/27/2020   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   Major depression, recurrent ([place]) 01/12/2018   Chronic right shoulder pain 12/20/2017  MDD (major depressive disorder), recurrent episode, moderate ([place]) 11/04/2017   MGUS (monoclonal gammopathy of unknown significance) 10/24/2017   Primary osteoarthritis of both hands 09/26/2017   Primary osteoarthritis of right shoulder 09/26/2017   Primary osteoarthritis of both hips 09/26/2017   Status post total knee replacement, right 09/26/2017   Primary osteoarthritis of both feet 09/26/2017   History of asthma 09/26/2017   Primary osteoarthritis of left knee 04/18/2017   Menorrhagia 11/04/2016   Fibroid tumor 11/04/2016   Adenomyosis 11/04/2016   Hypertension, essential 11/04/2016   Total knee replacement status 01/28/2016    Annamarie Major) Elainna Eshleman MPT  03/24/2021, 5:39 PM  [facility] [address] [place], [place], [address] Phone: [phone]   Fax:  [phone]  Name: Hannah [name] MRN: [id number] Date of Birth: 06/14/1960

## 2021-03-29 ENCOUNTER — Other Ambulatory Visit: Payer: Self-pay

## 2021-03-29 ENCOUNTER — Ambulatory Visit: Payer: BC Managed Care – PPO

## 2021-03-29 DIAGNOSIS — M25652 Stiffness of left hip, not elsewhere classified: Secondary | ICD-10-CM | POA: Diagnosis not present

## 2021-03-29 DIAGNOSIS — R5383 Other fatigue: Secondary | ICD-10-CM | POA: Diagnosis not present

## 2021-03-29 DIAGNOSIS — R293 Abnormal posture: Secondary | ICD-10-CM

## 2021-03-29 DIAGNOSIS — R2689 Other abnormalities of gait and mobility: Secondary | ICD-10-CM

## 2021-03-29 DIAGNOSIS — G8929 Other chronic pain: Secondary | ICD-10-CM

## 2021-03-29 DIAGNOSIS — R262 Difficulty in walking, not elsewhere classified: Secondary | ICD-10-CM | POA: Diagnosis not present

## 2021-03-29 DIAGNOSIS — R252 Cramp and spasm: Secondary | ICD-10-CM | POA: Diagnosis not present

## 2021-03-29 DIAGNOSIS — C9000 Multiple myeloma not having achieved remission: Secondary | ICD-10-CM

## 2021-03-29 DIAGNOSIS — M25561 Pain in right knee: Secondary | ICD-10-CM | POA: Diagnosis not present

## 2021-03-29 DIAGNOSIS — R2681 Unsteadiness on feet: Secondary | ICD-10-CM

## 2021-03-29 DIAGNOSIS — M5442 Lumbago with sciatica, left side: Secondary | ICD-10-CM | POA: Diagnosis not present

## 2021-03-29 DIAGNOSIS — M6281 Muscle weakness (generalized): Secondary | ICD-10-CM

## 2021-03-29 NOTE — Therapy (Signed)
[facility] @ [address] [place], [place], [address] Phone: [phone]   Fax:  [phone]  Physical Therapy Treatment  Patient Details  Name: Hannah [name] MRN: [id number] Date of Birth: [birth year] Referring Provider (PT): Cira Rue   Encounter Date: 03/29/2021   PT End of Session - 03/29/21 0846     Visit Number 20    Number of Visits 22    Date for PT Re-Evaluation 04/13/21    Authorization Type BCBS    PT Start Time 0800    PT Stop Time 0845    PT Time Calculation (min) 45 min    Activity Tolerance Patient tolerated treatment well    Behavior During Therapy [facility] for tasks assessed/performed             Past Medical History:  Diagnosis Date   Allergy    Anemia    Anxiety    Arthritis    Asthma    Depression    Hypertension     Past Surgical History:  Procedure Laterality Date   CHOLECYSTECTOMY  1993   COLONOSCOPY     TOTAL KNEE ARTHROPLASTY Right 01/28/2016   TOTAL KNEE ARTHROPLASTY Right 01/28/2016   Procedure: RIGHT TOTAL KNEE ARTHROPLASTY;  Surgeon: [name], MD;  Location: [facility];  Service: Orthopedics;  Laterality: Right;   TRIGGER FINGER RELEASE Right 06/18/2014   Procedure: RIGHT LONG FINGER TRIGGER RELEASE;  Surgeon: [name], MD;  Location: [facility];  Service: Orthopedics;  Laterality: Right;   TUBAL LIGATION     VAGINAL HYSTERECTOMY Bilateral 11/04/2016   Procedure: HYSTERECTOMY VAGINAL with Bilateral Salpingectomy;  Surgeon: [name], MD;  Location: [facility];  Service: Gynecology;  Laterality: Bilateral;   WEIL OSTEOTOMY Right 07/07/2020   Procedure: RIGHT FOOT WEIL OSTEOTOMY 2, 3, AND 4 METATARSALS AND PROXIMAL INTERPHALANGEAL JOINT FUSION 2 & 4 TOES;  Surgeon: Newt Minion, MD;  Location: [facility];  Service: Orthopedics;  Laterality: Right;    There were no vitals filed for this visit.   Subjective Assessment - 03/29/21 0758      Subjective I continue to have some LBP and it hurt it alot this weekend.  I did walk a little more than normal and stand a little longer than usual.    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 She as diagnosed in February 2022 with Multiple Myeloma not in remission. 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 no limits    How long can you stand comfortably? 36min    Diagnostic tests no scans scheduled recently    Patient Stated Goals get strong, build up the muscles    Currently in Pain? Yes    Pain Score 4     Pain Location Buttocks    Pain Orientation Right;Left    Pain Descriptors / Indicators Aching    Pain Type Chronic pain    Pain Onset More than a month ago    Pain Frequency Intermittent    Aggravating Factors  standing, walking    Effect of Pain on Daily Activities limits standing,walking                               [facility] Adult  PT Treatment/Exercise - 03/29/21 0001       Exercises   Other Exercises  Nu Step L5, seat 10, a x 5 min UE at #8.  lateral step and hold onto ax pad x 10 right and 10 left in parallel bars, 6 in forward  step ups Rt/Lt lead x 10 each with HHA, standing scapular retraction yellow, 2 x 10, marching at back of bike 2  x 30"  ball squeeze purple ball x 10, standing mini squat 2x 10,  , heel raises 2 x 10, gastroc stretch x 3 B in parallel bars, , sidestepping in  bars 4 lengths, tandem walking 4 lengths, backward walking 4 lengths, bicep curls 2# 2x10      Lumbar Exercises: Supine   Pelvic Tilt 5 seconds;10 reps    Bent Knee Raise 20 reps    Other Supine Lumbar Exercises SKTCx3 B    Other Supine Lumbar Exercises FABER stretch x 3 B                          PT Long Term Goals - 02/16/21 1346       PT LONG TERM GOAL #1   Title Pt will return to the gym     Time 8    Period Weeks    Status New      PT LONG TERM GOAL #2   Title Pt will improve 26mn walk test to at least 10097fto demonstrate decreased fall risk    Baseline 643    Time 8    Period Weeks    Status New      PT LONG TERM GOAL #3   Title Pt will be able to perform a 5 x sts in 20 sec or less showing improved LE strength and    Baseline 24sec    Time 8    Period Weeks    Status New      PT LONG TERM GOAL #4   Title Pt will be able to go up and down stairs at home    Time 8    Period Weeks    Status New      PT LONG TERM GOAL #5   Title NA                   Plan - 03/29/21 0847     Clinical Impression Statement pt did very well today and is requiring much less rest and increased exercise activities.  She was able to tolerate higher level on Nustep. Balance is improving nicely.  LBP was alleviated with doing back exercises and hip stretching.    Personal Factors and Comorbidities Comorbidity 3+    Comorbidities Multiple Myeloma, multi joint OA, recent foot surgery, fall risk,SCT    Examination-Activity Limitations Sleep;Squat;Stand;Stairs;Transfers;Locomotion Level;Sit    Examination-Participation Restrictions Cleaning;Community Activity;Occupation;Shop    Stability/Clinical Decision Making Stable/Uncomplicated    Rehab Potential Good    PT Frequency 2x / week    PT Duration 8 weeks    PT Treatment/Interventions ADLs/Self Care Home Management;Aquatic Therapy;Gait training;Stair training;Functional mobility training;Therapeutic activities;Therapeutic exercise;Balance training;Neuromuscular re-education;Manual techniques;Patient/family education;Passive range of motion;Joint Manipulations    PT Next Visit Plan cont conditioning and strength post SCT    Consulted and Agree with Plan of Care Patient             Patient will benefit from skilled therapeutic intervention in order to improve the following deficits and impairments:  Abnormal  gait, Decreased  mobility, Decreased activity tolerance, Decreased endurance, Decreased strength, Decreased balance, Decreased knowledge of precautions, Difficulty walking, Postural dysfunction, Pain  Visit Diagnosis: Abnormal posture  Chronic pain of right knee  Difficulty in walking, not elsewhere classified  Muscle weakness (generalized)  Unsteadiness on feet  Other abnormalities of gait and mobility  Multiple myeloma not having achieved remission [facility])     Problem List Patient Active Problem List   Diagnosis Date Noted   Multiple myeloma ([place]) 07/13/2020   Claw toe, right    Trigger finger, left middle finger 04/09/2020   Stenosing tenosynovitis of finger of left hand 02/27/2020   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   Major depression, recurrent ([place]) 01/12/2018   Chronic right shoulder pain 12/20/2017   MDD (major depressive disorder), recurrent episode, moderate ([place]) 11/04/2017   MGUS (monoclonal gammopathy of unknown significance) 10/24/2017   Primary osteoarthritis of both hands 09/26/2017   Primary osteoarthritis of right shoulder 09/26/2017   Primary osteoarthritis of both hips 09/26/2017   Status post total knee replacement, right 09/26/2017   Primary osteoarthritis of both feet 09/26/2017   History of asthma 09/26/2017   Primary osteoarthritis of left knee 04/18/2017   Menorrhagia 11/04/2016   Fibroid tumor 11/04/2016   Adenomyosis 11/04/2016   Hypertension, essential 11/04/2016   Total knee replacement status 01/28/2016    Claris Pong, PT 03/29/2021, 8:56 AM  [facility] @ [place], [place], [address] Phone: [phone]   Fax:  [phone]  Name: Hannah [name] MRN: [id number] Date of Birth: 04-06-1961

## 2021-03-30 ENCOUNTER — Encounter: Payer: BC Managed Care – PPO | Admitting: Rehabilitation

## 2021-03-31 ENCOUNTER — Encounter: Payer: Self-pay | Admitting: Physical Therapy

## 2021-03-31 ENCOUNTER — Other Ambulatory Visit: Payer: Self-pay

## 2021-03-31 ENCOUNTER — Ambulatory Visit: Payer: BC Managed Care – PPO | Attending: Family | Admitting: Physical Therapy

## 2021-03-31 DIAGNOSIS — M25652 Stiffness of left hip, not elsewhere classified: Secondary | ICD-10-CM | POA: Insufficient documentation

## 2021-03-31 DIAGNOSIS — R252 Cramp and spasm: Secondary | ICD-10-CM | POA: Insufficient documentation

## 2021-03-31 DIAGNOSIS — R5383 Other fatigue: Secondary | ICD-10-CM | POA: Diagnosis not present

## 2021-03-31 DIAGNOSIS — M5442 Lumbago with sciatica, left side: Secondary | ICD-10-CM | POA: Diagnosis not present

## 2021-03-31 DIAGNOSIS — R293 Abnormal posture: Secondary | ICD-10-CM | POA: Diagnosis not present

## 2021-03-31 DIAGNOSIS — C9000 Multiple myeloma not having achieved remission: Secondary | ICD-10-CM | POA: Insufficient documentation

## 2021-03-31 DIAGNOSIS — G8929 Other chronic pain: Secondary | ICD-10-CM | POA: Diagnosis not present

## 2021-03-31 DIAGNOSIS — J101 Influenza due to other identified influenza virus with other respiratory manifestations: Secondary | ICD-10-CM | POA: Diagnosis not present

## 2021-03-31 DIAGNOSIS — R262 Difficulty in walking, not elsewhere classified: Secondary | ICD-10-CM | POA: Diagnosis not present

## 2021-03-31 DIAGNOSIS — M25561 Pain in right knee: Secondary | ICD-10-CM | POA: Insufficient documentation

## 2021-03-31 DIAGNOSIS — R2681 Unsteadiness on feet: Secondary | ICD-10-CM | POA: Diagnosis not present

## 2021-03-31 DIAGNOSIS — R2689 Other abnormalities of gait and mobility: Secondary | ICD-10-CM | POA: Insufficient documentation

## 2021-03-31 DIAGNOSIS — J4540 Moderate persistent asthma, uncomplicated: Secondary | ICD-10-CM | POA: Diagnosis not present

## 2021-03-31 DIAGNOSIS — M6281 Muscle weakness (generalized): Secondary | ICD-10-CM | POA: Diagnosis not present

## 2021-03-31 DIAGNOSIS — R0602 Shortness of breath: Secondary | ICD-10-CM | POA: Diagnosis not present

## 2021-03-31 NOTE — Therapy (Signed)
[facility] @ [place] [place], [place], [address] Phone: [phone]   Fax:  [phone]  Physical Therapy Treatment  Patient Details  Name: Hannah [name] MRN: [id number] Date of Birth: 11-Sep-1960 Referring Provider (PT): Cira Rue   Encounter Date: 03/31/2021   PT End of Session - 03/31/21 0928     Visit Number 21    Number of Visits 22    Date for PT Re-Evaluation 04/13/21    Authorization Type BCBS    PT Start Time 0845    PT Stop Time 0930    PT Time Calculation (min) 45 min             Past Medical History:  Diagnosis Date   Allergy    Anemia    Anxiety    Arthritis    Asthma    Depression    Hypertension     Past Surgical History:  Procedure Laterality Date   CHOLECYSTECTOMY  1993   COLONOSCOPY     TOTAL KNEE ARTHROPLASTY Right 01/28/2016   TOTAL KNEE ARTHROPLASTY Right 01/28/2016   Procedure: RIGHT TOTAL KNEE ARTHROPLASTY;  Surgeon: [name], MD;  Location: [facility];  Service: Orthopedics;  Laterality: Right;   TRIGGER FINGER RELEASE Right 06/18/2014   Procedure: RIGHT LONG FINGER TRIGGER RELEASE;  Surgeon: [name], MD;  Location: [facility];  Service: Orthopedics;  Laterality: Right;   TUBAL LIGATION     VAGINAL HYSTERECTOMY Bilateral 11/04/2016   Procedure: HYSTERECTOMY VAGINAL with Bilateral Salpingectomy;  Surgeon: [name], MD;  Location: [facility];  Service: Gynecology;  Laterality: Bilateral;   WEIL OSTEOTOMY Right 07/07/2020   Procedure: RIGHT FOOT WEIL OSTEOTOMY 2, 3, AND 4 METATARSALS AND PROXIMAL INTERPHALANGEAL JOINT FUSION 2 & 4 TOES;  Surgeon: Newt Minion, MD;  Location: [facility];  Service: Orthopedics;  Laterality: Right;    There were no vitals filed for this visit.   Subjective Assessment - 03/31/21 1018     Subjective I do very well in the pool/water. It 'feels good."    Pertinent History Pt was diagnosed in 2019 with  smouldering multiple myeloma. She as diagnosed in February 2022 with Multiple Myeloma not in remission. 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 No/denies             Treatment: Patient seen for aquatic therapy today.  Treatment took place in water 2.5-4 feet deep depending upon activity.  Pt entered the pool via stairs, slowly with moderate use of railings. Water temp 94 degrees F. Pt requires buoyancy of water for support and to offload joints with strengthening exercises.  Pt utilizes viscosity of the water required for strengthening.   Seated water bench with 75% submersion Pt performed seated LE AROM exercises 20x in all planes, pain assessment concurrent.  75% submersion for the following: 6 lengths of pool walking in each direction using large noodle for postural support. Abdominal compressions with neck pillow 3-5 sec hold 10x, added marching 10x alternating, wall exercises 10x hip kicks/lifts in each direction. VC to push/pull as pt sees fit/is able to tolerate. Intermittent rest break using seated decompression shape with large noodle. 30 sec underwater bicycle 1 min rest 3 bouts using noodle for flotation.  PT Long Term Goals - 02/16/21 1346       PT LONG TERM GOAL #1   Title Pt will return to the gym    Time 8    Period Weeks    Status New      PT LONG TERM GOAL #2   Title Pt will improve 41min walk test to at least 102ft to demonstrate decreased fall risk    Baseline 643    Time 8    Period Weeks    Status New      PT LONG TERM GOAL #3   Title Pt will be able to perform a 5 x sts in 20 sec or less showing improved LE strength and    Baseline 24sec    Time 8    Period Weeks    Status New      PT LONG TERM GOAL #4   Title Pt will be able to go up and down stairs at home    Time 8    Period Weeks    Status New       PT LONG TERM GOAL #5   Title NA                   Plan - 03/31/21 1019     Clinical Impression Statement Pt arrives for aquatic PT with no pain currently and "feeling good." Pt reports no adverse effects exercising in the water and was able to explore more resistive work today: cuing pt to push & pull against the water gently or whatever level she was comfortable doing.    Personal Factors and Comorbidities Comorbidity 3+    Comorbidities Multiple Myeloma, multi joint OA, recent foot surgery, fall risk,SCT    Examination-Activity Limitations Sleep;Squat;Stand;Stairs;Transfers;Locomotion Level;Sit    Examination-Participation Restrictions Cleaning;Community Activity;Occupation;Shop    Stability/Clinical Decision Making Stable/Uncomplicated    Rehab Potential Good    PT Frequency 2x / week    PT Treatment/Interventions ADLs/Self Care Home Management;Aquatic Therapy;Gait training;Stair training;Functional mobility training;Therapeutic activities;Therapeutic exercise;Balance training;Neuromuscular re-education;Manual techniques;Patient/family education;Passive range of motion;Joint Manipulations    PT Next Visit Plan cont conditioning and strength post SCT    Consulted and Agree with Plan of Care Patient             Patient will benefit from skilled therapeutic intervention in order to improve the following deficits and impairments:  Abnormal gait, Decreased mobility, Decreased activity tolerance, Decreased endurance, Decreased strength, Decreased balance, Decreased knowledge of precautions, Difficulty walking, Postural dysfunction, Pain  Visit Diagnosis: No diagnosis found.     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   Major depression,  recurrent ([place]) 01/12/2018   Chronic right shoulder pain 12/20/2017   MDD (major depressive disorder), recurrent episode, moderate ([place]) 11/04/2017   MGUS (monoclonal gammopathy of unknown significance) 10/24/2017   Primary osteoarthritis of both hands 09/26/2017   Primary osteoarthritis of right shoulder 09/26/2017   Primary osteoarthritis of both hips 09/26/2017   Status post total knee replacement, right 09/26/2017   Primary osteoarthritis of both feet 09/26/2017   History of asthma 09/26/2017   Primary osteoarthritis of left knee 04/18/2017   Menorrhagia 11/04/2016   Fibroid tumor 11/04/2016   Adenomyosis 11/04/2016   Hypertension, essential 11/04/2016   Total  knee replacement status 01/28/2016    Lougenia Morrissey, PTA 03/31/2021, 4:02 PM  [facility] @ [place], [place], [address] Phone: [phone]   Fax:  [phone]  Name: Hannah [name] MRN: [id number] Date of Birth: Oct 12, 1960

## 2021-04-01 ENCOUNTER — Encounter: Payer: BC Managed Care – PPO | Admitting: Rehabilitation

## 2021-04-06 DIAGNOSIS — C9000 Multiple myeloma not having achieved remission: Secondary | ICD-10-CM | POA: Diagnosis not present

## 2021-04-06 DIAGNOSIS — Z9484 Stem cells transplant status: Secondary | ICD-10-CM | POA: Diagnosis not present

## 2021-04-07 DIAGNOSIS — C9000 Multiple myeloma not having achieved remission: Secondary | ICD-10-CM | POA: Diagnosis not present

## 2021-04-07 DIAGNOSIS — Z9484 Stem cells transplant status: Secondary | ICD-10-CM | POA: Diagnosis not present

## 2021-04-08 ENCOUNTER — Other Ambulatory Visit: Payer: Self-pay

## 2021-04-08 ENCOUNTER — Encounter: Payer: Self-pay | Admitting: Rehabilitation

## 2021-04-08 ENCOUNTER — Ambulatory Visit: Payer: BC Managed Care – PPO | Admitting: Rehabilitation

## 2021-04-08 DIAGNOSIS — R293 Abnormal posture: Secondary | ICD-10-CM | POA: Diagnosis not present

## 2021-04-08 DIAGNOSIS — M6281 Muscle weakness (generalized): Secondary | ICD-10-CM | POA: Diagnosis not present

## 2021-04-08 DIAGNOSIS — M25561 Pain in right knee: Secondary | ICD-10-CM | POA: Diagnosis not present

## 2021-04-08 DIAGNOSIS — R262 Difficulty in walking, not elsewhere classified: Secondary | ICD-10-CM

## 2021-04-08 DIAGNOSIS — G8929 Other chronic pain: Secondary | ICD-10-CM | POA: Diagnosis not present

## 2021-04-08 DIAGNOSIS — R2689 Other abnormalities of gait and mobility: Secondary | ICD-10-CM | POA: Diagnosis not present

## 2021-04-08 DIAGNOSIS — R2681 Unsteadiness on feet: Secondary | ICD-10-CM | POA: Diagnosis not present

## 2021-04-08 DIAGNOSIS — R252 Cramp and spasm: Secondary | ICD-10-CM | POA: Diagnosis not present

## 2021-04-08 DIAGNOSIS — R5383 Other fatigue: Secondary | ICD-10-CM | POA: Diagnosis not present

## 2021-04-08 DIAGNOSIS — C9000 Multiple myeloma not having achieved remission: Secondary | ICD-10-CM | POA: Diagnosis not present

## 2021-04-08 DIAGNOSIS — M25652 Stiffness of left hip, not elsewhere classified: Secondary | ICD-10-CM | POA: Diagnosis not present

## 2021-04-08 DIAGNOSIS — M5442 Lumbago with sciatica, left side: Secondary | ICD-10-CM | POA: Diagnosis not present

## 2021-04-08 NOTE — Therapy (Signed)
[facility] @ [address] [place], [place], [address] Phone: [phone]   Fax:  [phone]  Physical Therapy Treatment  Patient Details  Name: Hannah [name] MRN: [id number] Date of Birth: 1960/09/14 Referring Provider (PT): Cira Rue   Encounter Date: 04/08/2021   PT End of Session - 04/08/21 0837     Visit Number 22    Number of Visits 34    Date for PT Re-Evaluation 06/03/21    PT Start Time 0800    PT Stop Time 0840    PT Time Calculation (min) 40 min    Activity Tolerance Patient tolerated treatment well    Behavior During Therapy [facility] for tasks assessed/performed             Past Medical History:  Diagnosis Date   Allergy    Anemia    Anxiety    Arthritis    Asthma    Depression    Hypertension     Past Surgical History:  Procedure Laterality Date   CHOLECYSTECTOMY  1993   COLONOSCOPY     TOTAL KNEE ARTHROPLASTY Right 01/28/2016   TOTAL KNEE ARTHROPLASTY Right 01/28/2016   Procedure: RIGHT TOTAL KNEE ARTHROPLASTY;  Surgeon: [name], MD;  Location: [facility];  Service: Orthopedics;  Laterality: Right;   TRIGGER FINGER RELEASE Right 06/18/2014   Procedure: RIGHT LONG FINGER TRIGGER RELEASE;  Surgeon: [name], MD;  Location: [facility];  Service: Orthopedics;  Laterality: Right;   TUBAL LIGATION     VAGINAL HYSTERECTOMY Bilateral 11/04/2016   Procedure: HYSTERECTOMY VAGINAL with Bilateral Salpingectomy;  Surgeon: [name], MD;  Location: [facility];  Service: Gynecology;  Laterality: Bilateral;   WEIL OSTEOTOMY Right 07/07/2020   Procedure: RIGHT FOOT WEIL OSTEOTOMY 2, 3, AND 4 METATARSALS AND PROXIMAL INTERPHALANGEAL JOINT FUSION 2 & 4 TOES;  Surgeon: Newt Minion, MD;  Location: [facility];  Service: Orthopedics;  Laterality: Right;    There were no vitals filed for this visit.   Subjective Assessment - 04/08/21 0804     Subjective I had a Bone  marrow biopsy yesterday x 2 and it failed.  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 She as diagnosed in February 2022 with Multiple Myeloma not in remission. 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 Yes    Pain Score 4     Pain Location Hip    Pain Orientation Right;Left    Pain Descriptors / Indicators Aching    Pain Type Acute pain    Pain Onset Yesterday    Pain Frequency Constant                               [facility] Adult PT Treatment/Exercise - 04/08/21 0001       Exercises   Other Exercises  standing scapular retraction yellow, 2 x 10, marching at back of bike 2  x 30"  ball squeeze purple ball x 10, heel raises 2 x 10, bicep curls 2# 2x10, alternating punch 1# 2x5,                          PT Long Term Goals -  04/08/21 0844       PT LONG TERM GOAL #1   Title Pt will return to the gym    Status On-going      PT LONG TERM GOAL #2   Title Pt will improve 44mn walk test to at least 10059fto demonstrate decreased fall risk    Status On-going      PT LONG TERM GOAL #3   Title Pt will be able to perform a 5 x sts in 20 sec or less showing improved LE strength and    Status On-going      PT LONG TERM GOAL #4   Title Pt will be able to go up and down stairs at home    Status On-going                   Plan - 04/08/21 0842     Clinical Impression Statement Pt had a bone marrow biopsy x 2 that ultimately was unsuccessfuly yesterday so pt is currently limping and in pain.  held on lower body stuff today and included upperbody strengthening tolerated well.  Pt reached day 100 post transplant.  Extending POC today.    PT Frequency 2x / week    PT Duration 8 weeks    PT Treatment/Interventions ADLs/Self Care Home Management;Aquatic Therapy;Gait  training;Stair training;Functional mobility training;Therapeutic activities;Therapeutic exercise;Balance training;Neuromuscular re-education;Manual techniques;Patient/family education;Passive range of motion;Joint Manipulations    PT Next Visit Plan assess goals on next land visit - cont conditioning and strength post SCT    Consulted and Agree with Plan of Care Patient             Patient will benefit from skilled therapeutic intervention in order to improve the following deficits and impairments:     Visit Diagnosis: Abnormal posture  Chronic pain of right knee  Difficulty in walking, not elsewhere classified  Muscle weakness (generalized)     Problem List Patient Active Problem List   Diagnosis Date Noted   Multiple myeloma ([facility]02/14/2022   Claw toe, right    Trigger finger, left middle finger 04/09/2020   Stenosing tenosynovitis of finger of left hand 02/27/2020   Hemoglobin C trait ([facility]10/05/2017   Major depression, recurrent ([facility]08/16/2019   Chronic right shoulder pain 12/20/2017   MDD (major depressive disorder), recurrent episode, moderate ([facility]06/12/2017   MGUS (monoclonal gammopathy of unknown significance) 10/24/2017   Primary osteoarthritis of both hands 09/26/2017   Primary osteoarthritis of right shoulder 09/26/2017   Primary osteoarthritis of both hips 09/26/2017   Status post total knee replacement, right 09/26/2017   Primary osteoarthritis of both feet 09/26/2017   History of asthma 09/26/2017   Primary osteoarthritis of left knee 04/18/2017   Menorrhagia 11/04/2016   Fibroid tumor 11/04/2016   Adenomyosis 11/04/2016   Hypertension, essential 11/04/2016   Total knee replacement status 01/28/2016    TeStark BrayPT 04/08/2021, 8:45 AM  [place] [facility]: [phone] Fax:  [phone]: JoREISE HIETALARN: [id number] of Birth:  [birth year]

## 2021-04-12 DIAGNOSIS — Z6829 Body mass index (BMI) 29.0-29.9, adult: Secondary | ICD-10-CM | POA: Diagnosis not present

## 2021-04-12 DIAGNOSIS — Z01411 Encounter for gynecological examination (general) (routine) with abnormal findings: Secondary | ICD-10-CM | POA: Diagnosis not present

## 2021-04-12 DIAGNOSIS — K649 Unspecified hemorrhoids: Secondary | ICD-10-CM | POA: Diagnosis not present

## 2021-04-12 DIAGNOSIS — C9000 Multiple myeloma not having achieved remission: Secondary | ICD-10-CM | POA: Diagnosis not present

## 2021-04-12 DIAGNOSIS — Z1231 Encounter for screening mammogram for malignant neoplasm of breast: Secondary | ICD-10-CM | POA: Diagnosis not present

## 2021-04-13 ENCOUNTER — Other Ambulatory Visit: Payer: Self-pay

## 2021-04-13 ENCOUNTER — Ambulatory Visit: Payer: BC Managed Care – PPO | Admitting: Rehabilitation

## 2021-04-13 DIAGNOSIS — R252 Cramp and spasm: Secondary | ICD-10-CM | POA: Diagnosis not present

## 2021-04-13 DIAGNOSIS — R5383 Other fatigue: Secondary | ICD-10-CM | POA: Diagnosis not present

## 2021-04-13 DIAGNOSIS — R293 Abnormal posture: Secondary | ICD-10-CM

## 2021-04-13 DIAGNOSIS — R262 Difficulty in walking, not elsewhere classified: Secondary | ICD-10-CM | POA: Diagnosis not present

## 2021-04-13 DIAGNOSIS — Z79899 Other long term (current) drug therapy: Secondary | ICD-10-CM | POA: Diagnosis not present

## 2021-04-13 DIAGNOSIS — B009 Herpesviral infection, unspecified: Secondary | ICD-10-CM | POA: Diagnosis not present

## 2021-04-13 DIAGNOSIS — M6281 Muscle weakness (generalized): Secondary | ICD-10-CM

## 2021-04-13 DIAGNOSIS — M25561 Pain in right knee: Secondary | ICD-10-CM | POA: Diagnosis not present

## 2021-04-13 DIAGNOSIS — Z23 Encounter for immunization: Secondary | ICD-10-CM | POA: Diagnosis not present

## 2021-04-13 DIAGNOSIS — R2681 Unsteadiness on feet: Secondary | ICD-10-CM | POA: Diagnosis not present

## 2021-04-13 DIAGNOSIS — C9000 Multiple myeloma not having achieved remission: Secondary | ICD-10-CM

## 2021-04-13 DIAGNOSIS — G8929 Other chronic pain: Secondary | ICD-10-CM | POA: Diagnosis not present

## 2021-04-13 DIAGNOSIS — M5442 Lumbago with sciatica, left side: Secondary | ICD-10-CM | POA: Diagnosis not present

## 2021-04-13 DIAGNOSIS — Z8616 Personal history of COVID-19: Secondary | ICD-10-CM | POA: Diagnosis not present

## 2021-04-13 DIAGNOSIS — R2689 Other abnormalities of gait and mobility: Secondary | ICD-10-CM | POA: Diagnosis not present

## 2021-04-13 DIAGNOSIS — M25652 Stiffness of left hip, not elsewhere classified: Secondary | ICD-10-CM | POA: Diagnosis not present

## 2021-04-13 DIAGNOSIS — Z9484 Stem cells transplant status: Secondary | ICD-10-CM | POA: Diagnosis not present

## 2021-04-13 NOTE — Therapy (Signed)
[facility] @ [place], [place], [address] Phone: [phone]   Fax:  [phone]  Physical Therapy Treatment  Patient Details  Name: Hannah [name] MRN: [id number] Date of Birth: 04/18/1961 Referring Provider (PT): Cira Rue   Encounter Date: 04/13/2021   PT End of Session - 04/13/21 0828     Visit Number 23    Number of Visits 34    Date for PT Re-Evaluation 06/03/21    PT Start Time 0806   arrived late   PT Stop Time 0846    PT Time Calculation (min) 40 min    Activity Tolerance Patient tolerated treatment well    Behavior During Therapy [facility] for tasks assessed/performed             Past Medical History:  Diagnosis Date   Allergy    Anemia    Anxiety    Arthritis    Asthma    Depression    Hypertension     Past Surgical History:  Procedure Laterality Date   CHOLECYSTECTOMY  1993   COLONOSCOPY     TOTAL KNEE ARTHROPLASTY Right 01/28/2016   TOTAL KNEE ARTHROPLASTY Right 01/28/2016   Procedure: RIGHT TOTAL KNEE ARTHROPLASTY;  Surgeon: [name], MD;  Location: [facility];  Service: Orthopedics;  Laterality: Right;   TRIGGER FINGER RELEASE Right 06/18/2014   Procedure: RIGHT LONG FINGER TRIGGER RELEASE;  Surgeon: [name], MD;  Location: [facility];  Service: Orthopedics;  Laterality: Right;   TUBAL LIGATION     VAGINAL HYSTERECTOMY Bilateral 11/04/2016   Procedure: HYSTERECTOMY VAGINAL with Bilateral Salpingectomy;  Surgeon: [name], MD;  Location: [facility];  Service: Gynecology;  Laterality: Bilateral;   WEIL OSTEOTOMY Right 07/07/2020   Procedure: RIGHT FOOT WEIL OSTEOTOMY 2, 3, AND 4 METATARSALS AND PROXIMAL INTERPHALANGEAL JOINT FUSION 2 & 4 TOES;  Surgeon: Newt Minion, MD;  Location: [facility];  Service: Orthopedics;  Laterality: Right;    There were no vitals filed for this visit.   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 She as diagnosed in February 2022 with Multiple Myeloma not in remission. 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 No/denies                [facility] PT Assessment - 04/13/21 0001       Sit to Stand   Comments 5xSTS: 23 seconds                           [facility] Adult PT Treatment/Exercise - 04/13/21 0001       Exercises   Other Exercises  nustep level 5 x 68mn UE/LE, Airex step ups lateral x 10 bil and forward 6"x 10, standing scapular retraction yellow, 2 x 10, marching at back of bike 2  x 30"  ball squeeze purple ball x 10, heel raises 2 x 10, bicep curls 2# 2x10, side steps x 2 lengths bil                          PT Long Term Goals - 04/13/21 01694      PT  LONG TERM GOAL #1   Title Pt will return to the gym    Status On-going      PT LONG TERM GOAL #4   Title Pt will be able to go up and down stairs at home    Status Achieved                   Plan - 04/13/21 0835     Clinical Impression Statement Pt feeling better after bone marrow biopsy this week.  Back to doing all TE>  able to do nustep 1 more minute today.    PT Frequency 2x / week    PT Duration 8 weeks    PT Treatment/Interventions ADLs/Self Care Home Management;Aquatic Therapy;Gait training;Stair training;Functional mobility training;Therapeutic activities;Therapeutic exercise;Balance training;Neuromuscular re-education;Manual techniques;Patient/family education;Passive range of motion;Joint Manipulations    PT Next Visit Plan assess 20mn walk on next land visit - cont conditioning and strength post SCT    Consulted and Agree with Plan of Care Patient             Patient will benefit from skilled therapeutic intervention in order to improve the following deficits and  impairments:     Visit Diagnosis: Abnormal posture  Multiple myeloma not having achieved remission (HCC)  Fatigue, unspecified type  Muscle weakness (generalized)     Problem List Patient Active Problem List   Diagnosis Date Noted   Multiple myeloma ([place] 07/13/2020   Claw toe, right    Trigger finger, left middle finger 04/09/2020   Stenosing tenosynovitis of finger of left hand 02/27/2020   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   Major depression, recurrent ([facility] 01/12/2018   Chronic right shoulder pain 12/20/2017   MDD (major depressive disorder), recurrent episode, moderate ([place] 11/04/2017   MGUS (monoclonal gammopathy of unknown significance) 10/24/2017   Primary osteoarthritis of both hands 09/26/2017   Primary osteoarthritis of right shoulder 09/26/2017   Primary osteoarthritis of both hips 09/26/2017   Status post total knee replacement, right 09/26/2017   Primary osteoarthritis of both feet 09/26/2017   History of asthma 09/26/2017   Primary osteoarthritis of left knee 04/18/2017   Menorrhagia 11/04/2016   Fibroid tumor 11/04/2016   Adenomyosis 11/04/2016   Hypertension, essential 11/04/2016   Total knee replacement status 01/28/2016    TStark Bray PT 04/13/2021, 8:58 AM  [email or web address] [address]Phone: [phone]  Fax:  [phone] Name: Hannah SWAREYMRN: [id number] of Birth: 809-11-1960

## 2021-04-14 ENCOUNTER — Ambulatory Visit: Payer: BC Managed Care – PPO | Admitting: Physical Therapy

## 2021-04-14 ENCOUNTER — Telehealth: Payer: Self-pay | Admitting: Hematology

## 2021-04-14 ENCOUNTER — Encounter: Payer: Self-pay | Admitting: Physical Therapy

## 2021-04-14 DIAGNOSIS — M5442 Lumbago with sciatica, left side: Secondary | ICD-10-CM | POA: Diagnosis not present

## 2021-04-14 DIAGNOSIS — G8929 Other chronic pain: Secondary | ICD-10-CM

## 2021-04-14 DIAGNOSIS — R5383 Other fatigue: Secondary | ICD-10-CM

## 2021-04-14 DIAGNOSIS — R2689 Other abnormalities of gait and mobility: Secondary | ICD-10-CM | POA: Diagnosis not present

## 2021-04-14 DIAGNOSIS — R262 Difficulty in walking, not elsewhere classified: Secondary | ICD-10-CM

## 2021-04-14 DIAGNOSIS — M25561 Pain in right knee: Secondary | ICD-10-CM | POA: Diagnosis not present

## 2021-04-14 DIAGNOSIS — M6281 Muscle weakness (generalized): Secondary | ICD-10-CM | POA: Diagnosis not present

## 2021-04-14 DIAGNOSIS — R293 Abnormal posture: Secondary | ICD-10-CM

## 2021-04-14 DIAGNOSIS — C9000 Multiple myeloma not having achieved remission: Secondary | ICD-10-CM | POA: Diagnosis not present

## 2021-04-14 DIAGNOSIS — M25652 Stiffness of left hip, not elsewhere classified: Secondary | ICD-10-CM | POA: Diagnosis not present

## 2021-04-14 DIAGNOSIS — R252 Cramp and spasm: Secondary | ICD-10-CM

## 2021-04-14 DIAGNOSIS — R2681 Unsteadiness on feet: Secondary | ICD-10-CM | POA: Diagnosis not present

## 2021-04-14 NOTE — Therapy (Signed)
[facility] @ [place], [place], [address] Phone: [phone]   Fax:  [phone]  Physical Therapy Treatment  Patient Details  Name: Hannah [name] MRN: [id number] Date of Birth: 1961-02-23 Referring Provider (PT): Cira Rue   Encounter Date: 04/14/2021   PT End of Session - 04/14/21 0800     Visit Number 24    Number of Visits 34    Date for PT Re-Evaluation 06/03/21    Authorization Type BCBS    PT Start Time 0800    PT Stop Time 0843    PT Time Calculation (min) 43 min    Activity Tolerance Patient tolerated treatment well    Behavior During Therapy [facility] for tasks assessed/performed             Past Medical History:  Diagnosis Date   Allergy    Anemia    Anxiety    Arthritis    Asthma    Depression    Hypertension     Past Surgical History:  Procedure Laterality Date   CHOLECYSTECTOMY  1993   COLONOSCOPY     TOTAL KNEE ARTHROPLASTY Right 01/28/2016   TOTAL KNEE ARTHROPLASTY Right 01/28/2016   Procedure: RIGHT TOTAL KNEE ARTHROPLASTY;  Surgeon: [name], MD;  Location: [facility];  Service: Orthopedics;  Laterality: Right;   TRIGGER FINGER RELEASE Right 06/18/2014   Procedure: RIGHT LONG FINGER TRIGGER RELEASE;  Surgeon: [name], MD;  Location: [facility];  Service: Orthopedics;  Laterality: Right;   TUBAL LIGATION     VAGINAL HYSTERECTOMY Bilateral 11/04/2016   Procedure: HYSTERECTOMY VAGINAL with Bilateral Salpingectomy;  Surgeon: [name], MD;  Location: [facility];  Service: Gynecology;  Laterality: Bilateral;   WEIL OSTEOTOMY Right 07/07/2020   Procedure: RIGHT FOOT WEIL OSTEOTOMY 2, 3, AND 4 METATARSALS AND PROXIMAL INTERPHALANGEAL JOINT FUSION 2 & 4 TOES;  Surgeon: Newt Minion, MD;  Location: [facility];  Service: Orthopedics;  Laterality: Right;    There were no vitals filed for this visit.   Subjective Assessment - 04/14/21 1059      Subjective I am officially in remission! I am very excited and happy.    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 She as diagnosed in February 2022 with Multiple Myeloma not in remission. 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 No/denies            Treatment:Patient seen for aquatic therapy today.  Treatment took place in water 2.5-4 feet deep depending upon activity.  Pt entered the pool via stairs, step to step with mild use of hand rail. Water temp 95 degrees F. Pt requires buoyancy of water for support and to offload joints with strengthening exercises.  Pt utilizes viscosity of the water required for strengthening.   Seated water bench with 75% submersion Pt performed seated LE AROM exercises 20x in all planes, concurrent discussion of status.   75% submersion for the following: Water walking with small noodle UE push/pull 10x forward/back. No noodle, mitten hands for side stepping, small squat 10x. Hip circumduction and abd Bil 2x 10, mild UE for balance. High knee marching across pool 6 lengths, small noodle for postural engagement. UE multi-color weights for shoulder horizontal abd/add 2x10. Triangle UE wts shoulder ext/float 5x ( difficult, VC to depress Rt scapula).  3 min seated decompression float/relaxation to end session.                                 PT Long Term Goals - 04/13/21 0836       PT LONG TERM GOAL #1   Title Pt will return to the gym    Status On-going      PT LONG TERM GOAL #4   Title Pt will be able to go up and down stairs at home    Status Achieved                   Plan - 04/14/21 0802     Clinical Impression Statement Pt found out she was officially in remission now. Pt had excellent energy and endurance during aquatic PT session today. Pt continues to increase her resisatance loads  in the water, able to increase her water walking endurance ( no need for rest, increased speed.) No fatigue at end of session.    Personal Factors and Comorbidities Comorbidity 3+    Comorbidities Multiple Myeloma, multi joint OA, recent foot surgery, fall risk,SCT    Examination-Activity Limitations Sleep;Squat;Stand;Stairs;Transfers;Locomotion Level;Sit    Examination-Participation Restrictions Cleaning;Community Activity;Occupation;Shop    Stability/Clinical Decision Making Stable/Uncomplicated    Rehab Potential Good    PT Frequency 2x / week    PT Duration 8 weeks    PT Treatment/Interventions ADLs/Self Care Home Management;Aquatic Therapy;Gait training;Stair training;Functional mobility training;Therapeutic activities;Therapeutic exercise;Balance training;Neuromuscular re-education;Manual techniques;Patient/family education;Passive range of motion;Joint Manipulations    PT Next Visit Plan assess 3mn walk on next land visit - cont conditioning and strength post SCT    Consulted and Agree with Plan of Care Patient             Patient will benefit from skilled therapeutic intervention in order to improve the following deficits and impairments:  Abnormal gait, Decreased mobility, Decreased activity tolerance, Decreased endurance, Decreased strength, Decreased balance, Decreased knowledge of precautions, Difficulty walking, Postural dysfunction, Pain  Visit Diagnosis: Abnormal posture  Multiple myeloma not having achieved remission (HCC)  Fatigue, unspecified type  Muscle weakness (generalized)  Chronic pain of right knee  Difficulty in walking, not elsewhere classified  Other abnormalities of gait and mobility  Unsteadiness on feet  Chronic low back pain with left-sided sciatica, unspecified back pain laterality  Cramp and spasm  Stiffness of left hip, not elsewhere classified     Problem List Patient Active Problem List   Diagnosis Date Noted   Multiple myeloma  ([facility] 07/13/2020   Claw toe, right    Trigger finger, left middle finger 04/09/2020   Stenosing tenosynovitis of finger of left hand 02/27/2020   Hemoglobin C trait ([place] 02/27/2018   Major depression, recurrent ([place] 01/12/2018   Chronic right shoulder pain 12/20/2017   MDD (major depressive disorder), recurrent episode, moderate ([place] 11/04/2017   MGUS (monoclonal gammopathy of unknown significance) 10/24/2017   Primary osteoarthritis of both hands 09/26/2017   Primary osteoarthritis of right shoulder 09/26/2017   Primary osteoarthritis of both hips 09/26/2017   Status post total knee replacement, right 09/26/2017   Primary osteoarthritis of both feet 09/26/2017   History of asthma 09/26/2017   Primary osteoarthritis of left knee 04/18/2017   Menorrhagia 11/04/2016   Fibroid tumor 11/04/2016   Adenomyosis 11/04/2016   Hypertension, essential 11/04/2016   Total knee replacement status 01/28/2016    Jaquane Boughner, PTA 04/14/2021, 11:03 AM  [facility] @ [place], [place], [address] Phone: [phone]   Fax:  [phone]  Name: Hannah [name] MRN: [id number] Date of Birth: January 07, 1961

## 2021-04-14 NOTE — Telephone Encounter (Signed)
Scheduled per sch msg. Called and spoke with patient. Confirmed appt  

## 2021-04-19 ENCOUNTER — Ambulatory Visit: Payer: BC Managed Care – PPO

## 2021-04-19 ENCOUNTER — Other Ambulatory Visit: Payer: Self-pay

## 2021-04-19 DIAGNOSIS — R5383 Other fatigue: Secondary | ICD-10-CM

## 2021-04-19 DIAGNOSIS — R293 Abnormal posture: Secondary | ICD-10-CM

## 2021-04-19 DIAGNOSIS — C9000 Multiple myeloma not having achieved remission: Secondary | ICD-10-CM

## 2021-04-19 DIAGNOSIS — M6281 Muscle weakness (generalized): Secondary | ICD-10-CM

## 2021-04-19 DIAGNOSIS — G8929 Other chronic pain: Secondary | ICD-10-CM

## 2021-04-19 NOTE — Therapy (Signed)
[facility] @ [place], [place], [address] Phone: [phone]   Fax:  [phone]  Physical Therapy Treatment  Patient Details  Name: Hannah [name] MRN: [id number] Date of Birth: 12-31-1960 Referring Provider (PT): Cira Rue   Encounter Date: 04/19/2021   PT End of Session - 04/19/21 0854     Visit Number 25    Number of Visits 34    Date for PT Re-Evaluation 06/03/21    Authorization Type BCBS    PT Start Time [phone]   pt late. car didn't start   PT Stop Time 0850    PT Time Calculation (min) 39 min    Activity Tolerance Patient tolerated treatment well    Behavior During Therapy [facility] for tasks assessed/performed             Past Medical History:  Diagnosis Date   Allergy    Anemia    Anxiety    Arthritis    Asthma    Depression    Hypertension     Past Surgical History:  Procedure Laterality Date   CHOLECYSTECTOMY  1993   COLONOSCOPY     TOTAL KNEE ARTHROPLASTY Right 01/28/2016   TOTAL KNEE ARTHROPLASTY Right 01/28/2016   Procedure: RIGHT TOTAL KNEE ARTHROPLASTY;  Surgeon: [name], MD;  Location: [facility];  Service: Orthopedics;  Laterality: Right;   TRIGGER FINGER RELEASE Right 06/18/2014   Procedure: RIGHT LONG FINGER TRIGGER RELEASE;  Surgeon: [name], MD;  Location: [facility];  Service: Orthopedics;  Laterality: Right;   TUBAL LIGATION     VAGINAL HYSTERECTOMY Bilateral 11/04/2016   Procedure: HYSTERECTOMY VAGINAL with Bilateral Salpingectomy;  Surgeon: [name], MD;  Location: [facility];  Service: Gynecology;  Laterality: Bilateral;   WEIL OSTEOTOMY Right 07/07/2020   Procedure: RIGHT FOOT WEIL OSTEOTOMY 2, 3, AND 4 METATARSALS AND PROXIMAL INTERPHALANGEAL JOINT FUSION 2 & 4 TOES;  Surgeon: Newt Minion, MD;  Location: [facility];  Service: Orthopedics;  Laterality: Right;    There were no vitals filed for this visit.   Subjective  Assessment - 04/19/21 0813     Subjective I had the pool last week and it went well. I have to do a guided bone  biopsy on Dec. 1 because they were unable to do it last time. I was able to get my Covid and flu shot and in January I get my immunizations.    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 She as diagnosed in February 2022 with Multiple Myeloma not in remission. 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 no limits    How long can you stand comfortably? 65mn    How long can you walk comfortably? 574m    Patient Stated Goals get strong, build up the muscles    Currently in Pain? Yes    Pain Score 5     Pain Location Shoulder    Pain Orientation Right    Pain Descriptors / Indicators Aching    Pain Type Chronic pain    Pain Onset More than a month ago    Pain Frequency Intermittent                [facility] PT Assessment - 04/19/21 0001  6 minute walk test results    Aerobic Endurance Distance Walked 898                           [facility] Adult PT Treatment/Exercise - 04/19/21 0001       Exercises   Other Exercises  purple ball squeeze 10 x 5 sec, mini squats 2 x 5, ax beam x 6 tandem and x 6 lengths sidestepping with intermiittent hand touch of parallel bars, step and hold on ax x 10 ea wiith occasional hand touch on bar, scapaular retraction yellow 2 x10, biceps curls 2# 2 x 10, Nustep x 3' seat 10, lev 5, UE 8                          PT Long Term Goals - 04/13/21 0836       PT LONG TERM GOAL #1   Title Pt will return to the gym    Status On-going      PT LONG TERM GOAL #4   Title Pt will be able to go up and down stairs at home    Status Achieved                   Plan - 04/19/21 0854     Clinical Impression Statement Started therapy today with 6 min  walk test doing laps in the gym.  She did 8 and 3/4 laps (898 feet) which was improved from 643 feet on 02/16/2021.  She was then able to complete a variety of sitting, and standing exercises and balances activities.  Ended with 3 min on Nu step secondary to pt fatigue.  She had no increased complaints of pain. She did require short rest after walking before starting exercises.   Personal Factors and Comorbidities Comorbidity 3+    Comorbidities Multiple Myeloma, multi joint OA, recent foot surgery, fall risk,SCT    Examination-Activity Limitations Sleep;Squat;Stand;Stairs;Transfers;Locomotion Level;Sit    Examination-Participation Restrictions Cleaning;Community Activity;Occupation;Shop    Stability/Clinical Decision Making Stable/Uncomplicated    Rehab Potential Good    PT Frequency 2x / week    PT Duration 8 weeks    PT Treatment/Interventions ADLs/Self Care Home Management;Aquatic Therapy;Gait training;Stair training;Functional mobility training;Therapeutic activities;Therapeutic exercise;Balance training;Neuromuscular re-education;Manual techniques;Patient/family education;Passive range of motion;Joint Manipulations    PT Next Visit Plan Continue conditioning/strength    Consulted and Agree with Plan of Care Patient             Patient will benefit from skilled therapeutic intervention in order to improve the following deficits and impairments:  Abnormal gait, Decreased mobility, Decreased activity tolerance, Decreased endurance, Decreased strength, Decreased balance, Decreased knowledge of precautions, Difficulty walking, Postural dysfunction, Pain  Visit Diagnosis: Abnormal posture  Multiple myeloma not having achieved remission (HCC)  Fatigue, unspecified type  Muscle weakness (generalized)  Chronic pain of right knee     Problem List Patient Active Problem List   Diagnosis Date Noted   Multiple myeloma ([place]) 07/13/2020   Claw toe, right    Trigger finger, left middle  finger 04/09/2020   Stenosing tenosynovitis of finger of left hand 02/27/2020   Hemoglobin C trait ([place]) 02/27/2018   Major depression, recurrent ([place]) 01/12/2018   Chronic right shoulder pain 12/20/2017   MDD (major depressive disorder), recurrent episode, moderate ([place]) 11/04/2017   MGUS (monoclonal gammopathy of unknown significance) 10/24/2017   Primary osteoarthritis of both hands 09/26/2017   Primary osteoarthritis  of right shoulder 09/26/2017   Primary osteoarthritis of both hips 09/26/2017   Status post total knee replacement, right 09/26/2017   Primary osteoarthritis of both feet 09/26/2017   History of asthma 09/26/2017   Primary osteoarthritis of left knee 04/18/2017   Menorrhagia 11/04/2016   Fibroid tumor 11/04/2016   Adenomyosis 11/04/2016   Hypertension, essential 11/04/2016   Total knee replacement status 01/28/2016    Claris Pong, PT 04/19/2021, 8:59 AM  [facility] @ [place], [place], [address] Phone: [phone]   Fax:  [phone]  Name: Hannah [name] MRN: [id number] Date of Birth: [birth year]

## 2021-04-21 ENCOUNTER — Other Ambulatory Visit: Payer: Self-pay

## 2021-04-21 ENCOUNTER — Encounter: Payer: Self-pay | Admitting: Physical Therapy

## 2021-04-21 ENCOUNTER — Ambulatory Visit: Payer: BC Managed Care – PPO | Admitting: Physical Therapy

## 2021-04-21 DIAGNOSIS — M5442 Lumbago with sciatica, left side: Secondary | ICD-10-CM

## 2021-04-21 DIAGNOSIS — M25561 Pain in right knee: Secondary | ICD-10-CM

## 2021-04-21 DIAGNOSIS — M25652 Stiffness of left hip, not elsewhere classified: Secondary | ICD-10-CM

## 2021-04-21 DIAGNOSIS — R262 Difficulty in walking, not elsewhere classified: Secondary | ICD-10-CM

## 2021-04-21 DIAGNOSIS — R293 Abnormal posture: Secondary | ICD-10-CM | POA: Diagnosis not present

## 2021-04-21 DIAGNOSIS — M6281 Muscle weakness (generalized): Secondary | ICD-10-CM | POA: Diagnosis not present

## 2021-04-21 DIAGNOSIS — R252 Cramp and spasm: Secondary | ICD-10-CM

## 2021-04-21 DIAGNOSIS — R5383 Other fatigue: Secondary | ICD-10-CM | POA: Diagnosis not present

## 2021-04-21 DIAGNOSIS — C9000 Multiple myeloma not having achieved remission: Secondary | ICD-10-CM | POA: Diagnosis not present

## 2021-04-21 DIAGNOSIS — R2681 Unsteadiness on feet: Secondary | ICD-10-CM

## 2021-04-21 DIAGNOSIS — G8929 Other chronic pain: Secondary | ICD-10-CM | POA: Diagnosis not present

## 2021-04-21 DIAGNOSIS — R2689 Other abnormalities of gait and mobility: Secondary | ICD-10-CM

## 2021-04-21 NOTE — Therapy (Signed)
[facility] @ [place], [place], [address] Phone: [phone]   Fax:  [phone]  Physical Therapy Treatment  Patient Details  Name: Hannah [name] MRN: [id number] Date of Birth: [birth year] Referring Provider (PT): Cira Rue   Encounter Date: 04/21/2021   PT End of Session - 04/21/21 1137     Visit Number 26    Number of Visits 34    Date for PT Re-Evaluation 06/03/21    Authorization Type BCBS    PT Start Time 0900   pt late   PT Stop Time 0930    PT Time Calculation (min) 30 min    Activity Tolerance Patient tolerated treatment well    Behavior During Therapy [facility] for tasks assessed/performed             Past Medical History:  Diagnosis Date   Allergy    Anemia    Anxiety    Arthritis    Asthma    Depression    Hypertension     Past Surgical History:  Procedure Laterality Date   CHOLECYSTECTOMY  1993   COLONOSCOPY     TOTAL KNEE ARTHROPLASTY Right 01/28/2016   TOTAL KNEE ARTHROPLASTY Right 01/28/2016   Procedure: RIGHT TOTAL KNEE ARTHROPLASTY;  Surgeon: [name], MD;  Location: [facility];  Service: Orthopedics;  Laterality: Right;   TRIGGER FINGER RELEASE Right 06/18/2014   Procedure: RIGHT LONG FINGER TRIGGER RELEASE;  Surgeon: [name], MD;  Location: [place] [facility];  Service: Orthopedics;  Laterality: Right;   TUBAL LIGATION     VAGINAL HYSTERECTOMY Bilateral 11/04/2016   Procedure: HYSTERECTOMY VAGINAL with Bilateral Salpingectomy;  Surgeon: [name], MD;  Location: [facility];  Service: Gynecology;  Laterality: Bilateral;   WEIL OSTEOTOMY Right 07/07/2020   Procedure: RIGHT FOOT WEIL OSTEOTOMY 2, 3, AND 4 METATARSALS AND PROXIMAL INTERPHALANGEAL JOINT FUSION 2 & 4 TOES;  Surgeon: Newt Minion, MD;  Location: [facility];  Service: Orthopedics;  Laterality: Right;    There were no vitals filed for this visit.   Subjective Assessment - 04/21/21  1137     Subjective No new complaints this AM.    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 She as diagnosed in February 2022 with Multiple Myeloma not in remission. 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 No/denies    Multiple Pain Sites No              Treatment: Patient seen for aquatic therapy today.  Treatment took place in water 2.5-4 feet deep depending upon activity.  Pt entered the pool via stairs with mild use of rails. Pt requires buoyancy of water for support and to offload joints with strengthening exercises.  Pt utilizes viscosity of the water required for strengthening. Water temp 93 degrees F.   Seated water bench with 75% submersion Pt performed seated LE AROM exercises 20x in all planes, concurrent discussion of current status.  75% submersion 8x water walking in each direction. Core compressions 5 sec hold 10x, Standing against the wall in slight PPT UE weights for horiz shoulder add/abd 2x15 ( endurance focus) Underwater bicycle 1 min, 1 min rest 3 bouts with large noodle behind pt. 1-2 short seated decompression floats for rest.  PT Long Term Goals - 04/13/21 0836       PT LONG TERM GOAL #1   Title Pt will return to the gym    Status On-going      PT LONG TERM GOAL #4   Title Pt will be able to go up and down stairs at home    Status Achieved                   Plan - 04/21/21 1138     Clinical Impression Statement Pt was 15 min late for aquatic PT today. Pt had no pain and was able to perform quite a few exercises in 30 min without taking many rest breaks. Mild fatigue noted at end of session but no pain.    Personal Factors and Comorbidities Comorbidity 3+    Comorbidities Multiple Myeloma, multi joint OA, recent foot surgery, fall risk,SCT     Examination-Activity Limitations Sleep;Squat;Stand;Stairs;Transfers;Locomotion Level;Sit    Stability/Clinical Decision Making Stable/Uncomplicated    Rehab Potential Good    PT Frequency 2x / week    PT Duration 8 weeks    PT Treatment/Interventions ADLs/Self Care Home Management;Aquatic Therapy;Gait training;Stair training;Functional mobility training;Therapeutic activities;Therapeutic exercise;Balance training;Neuromuscular re-education;Manual techniques;Patient/family education;Passive range of motion;Joint Manipulations    PT Next Visit Plan Continue conditioning/strength    Consulted and Agree with Plan of Care Patient             Patient will benefit from skilled therapeutic intervention in order to improve the following deficits and impairments:  Abnormal gait, Decreased mobility, Decreased activity tolerance, Decreased endurance, Decreased strength, Decreased balance, Decreased knowledge of precautions, Difficulty walking, Postural dysfunction, Pain  Visit Diagnosis: Abnormal posture  Multiple myeloma not having achieved remission (HCC)  Fatigue, unspecified type  Muscle weakness (generalized)  Chronic pain of right knee  Other abnormalities of gait and mobility  Difficulty in walking, not elsewhere classified  Unsteadiness on feet  Chronic low back pain with left-sided sciatica, unspecified back pain laterality  Cramp and spasm  Stiffness of left hip, not elsewhere classified     Problem List Patient Active Problem List   Diagnosis Date Noted   Multiple myeloma ([place]) 07/13/2020   Claw toe, right    Trigger finger, left middle finger 04/09/2020   Stenosing tenosynovitis of finger of left hand 02/27/2020   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   Major depression, recurrent ([place]) 01/12/2018   Chronic right shoulder pain 12/20/2017   MDD (major depressive disorder), recurrent episode, moderate ([place]) 11/04/2017   MGUS (monoclonal gammopathy of unknown  significance) 10/24/2017   Primary osteoarthritis of both hands 09/26/2017   Primary osteoarthritis of right shoulder 09/26/2017   Primary osteoarthritis of both hips 09/26/2017   Status post total knee replacement, right 09/26/2017   Primary osteoarthritis of both feet 09/26/2017   History of asthma 09/26/2017   Primary osteoarthritis of left knee 04/18/2017   Menorrhagia 11/04/2016   Fibroid tumor 11/04/2016   Adenomyosis 11/04/2016   Hypertension, essential 11/04/2016   Total knee replacement status 01/28/2016    Nnamdi Dacus, PTA 04/21/2021, 11:40 AM  [facility] @ [place], [place], [address] Phone: [phone]   Fax:  [phone]  Name: Hannah [name] MRN: [id number] Date of Birth: Sep 07, 1960

## 2021-04-25 NOTE — Progress Notes (Signed)
Hannah [name]   [phone] [phone]   Clinic Follow up Note   Patient Care Team: Tisovec, Fransico Him, MD as PCP - General (Internal Medicine) Bo Merino, MD as Consulting Physician (Rheumatology) Truitt Merle, MD as Consulting Physician (Hematology) 04/27/2021  CHIEF COMPLAINT: Follow up IgA kappa multiple myeloma   SUMMARY OF ONCOLOGIC HISTORY: Oncology History Overview Note  Cancer Staging Multiple myeloma ([place]) Staging form: Plasma Cell Myeloma and Plasma Cell Disorders, AJCC 8th Edition - Clinical stage from 07/20/2020: Beta-2-microglobulin (mg/L): 2.4, Albumin (g/dL): 3.2, ISS: Stage II, High-risk cytogenetics: Unknown, LDH: Normal - Signed by Truitt Merle, MD on 08/02/2020 Beta 2 microglobulin range (mg/L): Less than 3.5 Albumin range (g/dL): Less than 3.5 Cytogenetics: Unknown    Multiple myeloma ([place])  07/03/2020 Imaging   MRI Lumbar Spine  IMPRESSION: 1. Numerous T1 hypointense and STIR hyperintense lesions throughout the visualized spine and sacrum with multiple areas of extraosseous extension in the sacrum, detailed above and compatible with multiple myeloma given the clinical history. 2. An MRI of the pelvis with contrast could evaluate the full extent of the partially imaged sacral lesions, including left S1-S2 neural foraminal involvement and suspected involvement of the exited left L5 nerve. 3. Multilevel degenerative change without significant canal or foraminal stenosis in the lumbar spine.   07/13/2020 Initial Diagnosis   Multiple myeloma ([place])   07/20/2020 Cancer Staging   Staging form: Plasma Cell Myeloma and Plasma Cell Disorders, AJCC 8th Edition - Clinical stage from 07/20/2020: Beta-2-microglobulin (mg/L): 2.4, Albumin (g/dL): 3.2, ISS: Stage II, High-risk cytogenetics: Unknown, LDH: Normal - Signed by Truitt Merle, MD on 08/31/2020 Beta 2 microglobulin range (mg/L): Less than 3.5 Albumin range (g/dL): Less than  3.5 Cytogenetics: No abnormalities Bone disease on imaging: Present    07/24/2020 PET scan   IMPRESSION: 1. Moderate to high hypermetabolic activity associated with the expansile soft tissue masses within the LEFT and RIGHT pelvic bones is consistent with active multiple myeloma / plasmacytoma. 2. Lytic lesion in the LEFT scapula with mild metabolic activity is indeterminate. 3. Metabolic activity associated with the RIGHT knee prosthetic and RIGHT distal foot fusion is favored post procedural inflammation.     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  The findings are most consistent with  persistent/recurrent/previously known plasma cell neoplasm.  For  completeness, immunohistochemical stain for CD138 and in situ  hybridization for kappa and lambda will be performed and the results  reported in an addendum.  Correlation with cytogenetic and FISH studies  is recommended.   08/03/2020 -  Chemotherapy   Zometa q4weeks starting 08/03/20    08/17/2020 - 11/29/2020 Chemotherapy   Velcade weekly, Revlimid, dex 70m weekly (VRd)  Starting 08/17/20. Last dose Velcade 11/23/20 and last dose revlimid on 11/29/20      CURRENT THERAPY:  Induction RVD S/p high dose chemo with melphalan and auto SCT 12/23/20 Pending maintenance revlimid  Restart zometa q3 months  INTERVAL HISTORY: Ms. GStonehamreturns for follow up  as scheduled. Last seen in 11/2020 for induction RVD  chemo. She underwent auto SCT 12/23/20, post transplant PET/CT showed no evidence of disease.  She is currently day +125, she has recovered well.  She continues twice weekly PT at [facility] field and using the [facility] pool.  Her stamina has improved.  Aerobics also helps her arthritis, still has occasional right shoulder and low back/tailbone aches.  No recurrence of the pain she had prior to induction chemo and radiation. She is able to run short errands on days with PT, otherwise she stays at home but not in bed nearly as much.  She takes stairs easier.  She was down to 150 pounds during transplant, she would go days without eating but strong appetite has returned and she is gaining weight.  She has started revaccinations, second COVID shot due 12/7 and she had the flu vaccine.  She did actually get a mild case of the flu with fever and cough, resolved with Tamiflu and supportive meds.  She has residual mild neuropathy with tingling and cold sensation in her toes from prior chemo.  Bowels moving well, denies nausea/vomiting, constipation, diarrhea, abdominal pain or bloating, skin rash, fever, chills, cough, chest pain, dyspnea, or other complaints.   MEDICAL HISTORY:  Past Medical History:  Diagnosis Date   Allergy    Anemia    Anxiety    Arthritis    Asthma    Depression    Hypertension     SURGICAL HISTORY: Past Surgical History:  Procedure Laterality Date   CHOLECYSTECTOMY  1993   COLONOSCOPY     TOTAL KNEE ARTHROPLASTY Right 01/28/2016   TOTAL KNEE ARTHROPLASTY Right 01/28/2016   Procedure: RIGHT TOTAL KNEE ARTHROPLASTY;  Surgeon: [name], MD;  Location: [facility];  Service: Orthopedics;  Laterality: Right;   TRIGGER FINGER RELEASE Right 06/18/2014   Procedure: RIGHT LONG FINGER TRIGGER RELEASE;  Surgeon: [name], MD;  Location: [facility];  Service: Orthopedics;  Laterality: Right;   TUBAL LIGATION     VAGINAL HYSTERECTOMY Bilateral 11/04/2016   Procedure:  HYSTERECTOMY VAGINAL with Bilateral Salpingectomy;  Surgeon: [name], MD;  Location: [facility];  Service: Gynecology;  Laterality: Bilateral;   WEIL OSTEOTOMY Right 07/07/2020   Procedure: RIGHT FOOT WEIL OSTEOTOMY 2, 3, AND 4 METATARSALS AND PROXIMAL INTERPHALANGEAL JOINT FUSION 2 & 4 TOES;  Surgeon: Newt Minion, MD;  Location: [facility];  Service: Orthopedics;  Laterality: Right;    I have reviewed the social history and family history with the patient and they are unchanged from previous note.  ALLERGIES:  is allergic to elemental sulfur and sulfa antibiotics.  MEDICATIONS:  Current Outpatient Medications  Medication Sig Dispense Refill   calcium carbonate (OSCAL) 1500 (600 Ca) MG TABS tablet Take 600 mg of elemental calcium by mouth daily.     Cholecalciferol (VITAMIN D3) 1.25 MG (50000 UT) CAPS Take 1 capsule by mouth once a week.     dapsone 100 MG tablet Take by mouth.     ferrous sulfate 325 (65 FE) MG tablet Take 325 mg by mouth 2 (two) times daily with a meal.     folic acid (FOLVITE) 1 MG tablet folic acid 1 mg tablet  TAKE 1 TABLET BY MOUTH EVERY DAY     naproxen sodium (ALEVE) 220 MG tablet as needed.     potassium chloride (MICRO-K) 10 MEQ CR capsule Take by mouth.     valACYclovir (VALTREX) 500  MG tablet Take 500 mg by mouth 2 (two) times daily.     vitamin C (ASCORBIC ACID) 500 MG tablet      ondansetron (ZOFRAN) 8 MG tablet Take 1 tablet (8 mg total) by mouth every 8 (eight) hours as needed for nausea or vomiting. (Patient not taking: Reported on 04/27/2021) 20 tablet 3   prochlorperazine (COMPAZINE) 10 MG tablet Take 1 tablet (10 mg total) by mouth every 6 (six) hours as needed for nausea or vomiting. (Patient not taking: Reported on 04/27/2021) 30 tablet 3   No current facility-administered medications for this visit.    PHYSICAL EXAMINATION: ECOG PERFORMANCE STATUS: 1-2  Vitals:   04/27/21 0928  BP: (!) 153/97  Pulse: 95  Resp: 19   Temp: 98.6 F (37 C)  SpO2: 97%   Filed Weights   04/27/21 0928  Weight: 186 lb 6.4 oz (84.6 kg)    GENERAL:alert, no distress and comfortable SKIN: no rash  EYES: sclera clear NECK:without mass LUNGS: clear with normal breathing effort HEART: regular rate & rhythm, no lower extremity edema ABDOMEN:abdomen soft, non-tender and normal bowel sounds Musculoskeletal: nonfocal NEURO: alert & oriented x 3 with fluent speech, no focal motor/sensory deficits  LABORATORY DATA:  I have reviewed the data as listed CBC Latest Ref Rng & Units 02/24/2021 02/24/2021 01/13/2021  WBC 4.0 - 10.5 K/uL 5.2 - 3.6(L)  Hemoglobin 12.0 - 15.0 g/dL 8.8(L) - 10.3(L)  Hematocrit 36.0 - 46.0 % 27.6(L) - 30.5(L)  Platelets 150 - 400 K/uL 178 178 250     CMP Latest Ref Rng & Units 02/24/2021 01/13/2021 11/23/2020  Glucose 70 - 99 mg/dL 94 104(H) 92  BUN 6 - 20 mg/dL 9 6 8   Creatinine 0.44 - 1.00 mg/dL 0.68 0.74 0.73  Sodium 135 - 145 mmol/L 144 144 143  Potassium 3.5 - 5.1 mmol/L 3.4(L) 4.1 3.7  Chloride 98 - 111 mmol/L 106 104 107  CO2 22 - 32 mmol/L 27 31 27   Calcium 8.9 - 10.3 mg/dL 9.3 9.6 8.5(L)  Total Protein 6.5 - 8.1 g/dL 6.5 6.2(L) 5.5(L)  Total Bilirubin 0.3 - 1.2 mg/dL 0.9 0.4 0.9  Alkaline Phos 38 - 126 U/L 65 68 74  AST 15 - 41 U/L 16 19 17   ALT 0 - 44 U/L 8 17 18       RADIOGRAPHIC STUDIES: I have personally reviewed the radiological images as listed and agreed with the findings in the report. No results found.   ASSESSMENT & PLAN:  JAMARI DIANA is a 60 y.o. female with     1. 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 -Her MM labs from 06/01/20 showed increase in IgA and and M-proteinto 3.3. Her light chain is also increasing with ratio 87.53, which is suspicious for MM. Repeat lumbar MRI showed multiple T1 lesions in her lumbar spine and sacrum compatible with MM. -Staging PET scan from 07/24/2020 showed large hypermetabolic activity associated with the expansile soft tissue mass within the left and right pelvic bones, consistent with active MM/plasmacytoma, and a lytic lesion in the left scapula  with mild activity.   -She underwent staging bone marrow biopsy on 07/30/2020 which showed slightly hypercellular marrow for age and 31% plasma cells.  Cytogenetics and FISH were unremarkable -Due to her mobility issues and pain, she completed palliative RT to pelvis, scapula/left chest, T4-6 and T10-12 by Dr. 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 -She underwent high-dose chemo with melphalan and autologous stem cell transplant at [facility] 12/23/2020 by Dr. Aris Lot.  White cells engrafted 01/03/2021 and platelets 01/13/2021, last platelet transfusion 01/02/2021 -She experienced weight loss, fatigue, and deconditioning with transplant but has recovered well, still doing PT. posttreatment PET 04/06/2021 showed no evidence of disease, posttreatment bone marrow biopsy is pending -The recommendation is to resume bisphosphonate every 3 months for total of 2 years -Continue prophylaxis with valacyclovir for 1 year and dapsone for 6 months posttransplant; revaccinate per [facility] protocol -Monthly lab per [facility]  to include CBC, CMP, SPEP, UPEP, quant immunoglobulins, and serum free light chains x1 year -Hannah [name] day +125 is clinically doing well, her performance status has improved.  She appears ready to begin maintenance Revlimid 10 mg daily days 1-21 q. 28 days.  We reviewed potential side effects, she understands the goal is to deepen the response to her transplant to achieve longer remission -Recent labs reviewed, plan to start 12/5.  Return for lab and follow-up on her week off in 4 weeks   2. Low back pain, right hip pain -She initially attributes her pain to sciatica pain. -work up per ortho showed multiple T1 lesions of her spine and sacrum compatible with MM on her 07/03/20 MRI lumbar spine. -PET 07/24/20 showed large hypermetabolic lesions in the left and right pelvis consistent with MM/plasmacytoma and a faintly metabolic lesion in the left scapula which is not currently causing significant pain. -She completed palliative radiation to the pelvis, left scapula, T4-6, and T10-12 on 3/3-3/16/2022 -She began monthly Zometa and oral calcium on 08/03/2020, last dose 10/2020 -Pain resolved after RT, she has not required pain medication recently -She has tailbone and right shoulder arthritic pain at baseline, stable   3. 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 -Hemoglobin electrophoresis showed increase in Hg C, consistent with Hg C trait.  She is aware her children are at risk of hemoglobin C trait, they have been encouraged to be tested. -Anemia worsened recently with MM -Continue multivitamin, oral iron, folic acid, vitamin C and D per transplant team   4. HTN, OA, Obesity  -On chlorthalidone for HTN -She will continue to f/u with PCP, Chiropractor and Ortho.  -She lost significant amount of weight during transplant process but is gaining it back, appetite recovered   5. Anxiety, depression -under care of [place] provider,  tolerating Zoloft -She has strong faith and good family/social support.  She feels very positive about her experience and wants to share with others -I will connect her to our community out reach person  Plan: -Recent labs reviewed, Hannah [name] has recovered well from transplant -Begin maintenance Revlimid 10 mg p.o. daily 3 weeks on/1 week off -Lab and follow-up in 4  weeks -Resume Zometa at next visit -Continue antimicrobial prophylaxis and supplements per transplant team -Posttreatment bone marrow biopsy at [facility] scheduled this week -[place] in January as scheduled -Plan reviewed with Dr. Burr Medico  All questions were answered. The patient knows to call the clinic with any problems, questions or concerns. No barriers to learning was detected. I spent 25 minutes counseling the patient face to face. The total time spent in the appointment was 40 minutes and more than 50% was on counseling and review of test results     Alla Feeling, NP 04/27/21

## 2021-04-26 ENCOUNTER — Ambulatory Visit: Payer: BC Managed Care – PPO

## 2021-04-26 ENCOUNTER — Other Ambulatory Visit: Payer: Self-pay

## 2021-04-26 DIAGNOSIS — R262 Difficulty in walking, not elsewhere classified: Secondary | ICD-10-CM | POA: Diagnosis not present

## 2021-04-26 DIAGNOSIS — M6281 Muscle weakness (generalized): Secondary | ICD-10-CM | POA: Diagnosis not present

## 2021-04-26 DIAGNOSIS — R293 Abnormal posture: Secondary | ICD-10-CM | POA: Diagnosis not present

## 2021-04-26 DIAGNOSIS — M5442 Lumbago with sciatica, left side: Secondary | ICD-10-CM | POA: Diagnosis not present

## 2021-04-26 DIAGNOSIS — G8929 Other chronic pain: Secondary | ICD-10-CM

## 2021-04-26 DIAGNOSIS — R2681 Unsteadiness on feet: Secondary | ICD-10-CM

## 2021-04-26 DIAGNOSIS — R5383 Other fatigue: Secondary | ICD-10-CM | POA: Diagnosis not present

## 2021-04-26 DIAGNOSIS — C9000 Multiple myeloma not having achieved remission: Secondary | ICD-10-CM | POA: Diagnosis not present

## 2021-04-26 DIAGNOSIS — R2689 Other abnormalities of gait and mobility: Secondary | ICD-10-CM | POA: Diagnosis not present

## 2021-04-26 DIAGNOSIS — M25652 Stiffness of left hip, not elsewhere classified: Secondary | ICD-10-CM | POA: Diagnosis not present

## 2021-04-26 DIAGNOSIS — M25561 Pain in right knee: Secondary | ICD-10-CM | POA: Diagnosis not present

## 2021-04-26 DIAGNOSIS — R252 Cramp and spasm: Secondary | ICD-10-CM | POA: Diagnosis not present

## 2021-04-26 NOTE — Therapy (Signed)
[facility] @ [place], [place], [address] Phone: [phone]   Fax:  [phone]  Physical Therapy Treatment  Patient Details  Name: Hannah [name] MRN: [id number] Date of Birth: 1960/10/31 Referring Provider (PT): Cira Rue   Encounter Date: 04/26/2021   PT End of Session - 04/26/21 0816     Visit Number 27    Number of Visits 34    Authorization Type BCBS    PT Start Time 0809   pt late   PT Stop Time 0852    PT Time Calculation (min) 43 min    Activity Tolerance Patient tolerated treatment well    Behavior During Therapy [facility] for tasks assessed/performed             Past Medical History:  Diagnosis Date   Allergy    Anemia    Anxiety    Arthritis    Asthma    Depression    Hypertension     Past Surgical History:  Procedure Laterality Date   CHOLECYSTECTOMY  1993   COLONOSCOPY     TOTAL KNEE ARTHROPLASTY Right 01/28/2016   TOTAL KNEE ARTHROPLASTY Right 01/28/2016   Procedure: RIGHT TOTAL KNEE ARTHROPLASTY;  Surgeon: [name], MD;  Location: [facility];  Service: Orthopedics;  Laterality: Right;   TRIGGER FINGER RELEASE Right 06/18/2014   Procedure: RIGHT LONG FINGER TRIGGER RELEASE;  Surgeon: [name], MD;  Location: [facility];  Service: Orthopedics;  Laterality: Right;   TUBAL LIGATION     VAGINAL HYSTERECTOMY Bilateral 11/04/2016   Procedure: HYSTERECTOMY VAGINAL with Bilateral Salpingectomy;  Surgeon: [name], MD;  Location: [facility];  Service: Gynecology;  Laterality: Bilateral;   WEIL OSTEOTOMY Right 07/07/2020   Procedure: RIGHT FOOT WEIL OSTEOTOMY 2, 3, AND 4 METATARSALS AND PROXIMAL INTERPHALANGEAL JOINT FUSION 2 & 4 TOES;  Surgeon: Newt Minion, MD;  Location: [facility];  Service: Orthopedics;  Laterality: Right;    There were no vitals filed for this visit.   Subjective Assessment - 04/26/21 0811     Subjective Did OK after last  visit.  I was just tired . Have bone marrow biopsy on Thursday.   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 She as diagnosed in February 2022 with Multiple Myeloma not in remission. 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 61mn    How long can you walk comfortably? 520m    Diagnostic tests no scans scheduled recently    Patient Stated Goals get strong, build up the muscles    Currently in Pain? Yes    Pain Score 3     Pain Location Shoulder    Pain Orientation Right    Pain Descriptors / Indicators Aching    Pain Type Chronic pain    Pain Onset More than a month ago    Multiple Pain Sites No                               [facility] Adult PT Treatment/Exercise - 04/26/21 0001       Exercises   Other Exercises  Nu step seat 9 lev 5 UE 9 x 7 min. airex beam 6 lengths tandem, sidestepping with intermittent hand  touch on bars,heel raises on ax X 20, marching on ax x 10 ea,feet together on ax with eyes closed 3 x 20 sec, purple ball squeeze seated x 10, seated elbow flexion 2#, 2 x 12, scapular retraction 2x12 standing with yellow TB, SLS 3 x10 sec each. bar touch intermittently prn                          PT Long Term Goals - 04/13/21 0836       PT LONG TERM GOAL #1   Title Pt will return to the gym    Status On-going      PT LONG TERM GOAL #4   Title Pt will be able to go up and down stairs at home    Status Achieved                   Plan - 04/26/21 0816     Clinical Impression Statement Pt was 9 min late.  She did an extra minute on the NuStep today.  After performing standing balance activities on the airex beam, and pad her legs were very fatigued and we switched to sitting exercises while she rested and then resumed several standing exercises. No increased pain.   SLS was difficult but when she engaged her core she was able to maintain on both sides for 10 second hold    Personal Factors and Comorbidities Comorbidity 3+    Comorbidities Multiple Myeloma, multi joint OA, recent foot surgery, fall risk,SCT    Examination-Participation Restrictions Cleaning;Community Activity;Occupation;Shop    Stability/Clinical Decision Making Stable/Uncomplicated    Clinical Decision Making Low    Rehab Potential Good    PT Duration 8 weeks    PT Treatment/Interventions ADLs/Self Care Home Management;Aquatic Therapy;Gait training;Stair training;Functional mobility training;Therapeutic activities;Therapeutic exercise;Balance training;Neuromuscular re-education;Manual techniques;Patient/family education;Passive range of motion;Joint Manipulations    PT Next Visit Plan Continue conditioning/strength    Consulted and Agree with Plan of Care Patient             Patient will benefit from skilled therapeutic intervention in order to improve the following deficits and impairments:  Abnormal gait, Decreased mobility, Decreased activity tolerance, Decreased endurance, Decreased strength, Decreased balance, Decreased knowledge of precautions, Difficulty walking, Postural dysfunction, Pain  Visit Diagnosis: Abnormal posture  Multiple myeloma not having achieved remission (HCC)  Fatigue, unspecified type  Muscle weakness (generalized)  Chronic pain of right knee  Other abnormalities of gait and mobility  Difficulty in walking, not elsewhere classified  Unsteadiness on feet  Chronic low back pain with left-sided sciatica, unspecified back pain laterality     Problem List Patient Active Problem List   Diagnosis Date Noted   Multiple myeloma ([place]) 07/13/2020   Claw toe, right    Trigger finger, left middle finger 04/09/2020   Stenosing tenosynovitis of finger of left hand 02/27/2020   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   Major depression, recurrent ([place])  01/12/2018   Chronic right shoulder pain 12/20/2017   MDD (major depressive disorder), recurrent episode, moderate ([place]) 11/04/2017   MGUS (monoclonal gammopathy of unknown significance) 10/24/2017   Primary osteoarthritis of both hands 09/26/2017   Primary osteoarthritis of right shoulder 09/26/2017   Primary osteoarthritis of both hips 09/26/2017   Status post total knee replacement, right 09/26/2017   Primary osteoarthritis of both feet 09/26/2017   History of asthma 09/26/2017   Primary osteoarthritis of left knee 04/18/2017   Menorrhagia 11/04/2016   Fibroid  tumor 11/04/2016   Adenomyosis 11/04/2016   Hypertension, essential 11/04/2016   Total knee replacement status 01/28/2016    Claris Pong, PT 04/26/2021, 8:57 AM  [facility] @ [place], [place], [address] Phone: [phone]   Fax:  [phone]  Name: Hannah [name] MRN: [id number] Date of Birth: [birth year]

## 2021-04-27 ENCOUNTER — Encounter: Payer: Self-pay | Admitting: Hematology

## 2021-04-27 ENCOUNTER — Encounter: Payer: Self-pay | Admitting: Nurse Practitioner

## 2021-04-27 ENCOUNTER — Other Ambulatory Visit (HOSPITAL_COMMUNITY): Payer: Self-pay

## 2021-04-27 ENCOUNTER — Inpatient Hospital Stay: Payer: BC Managed Care – PPO | Attending: Hematology | Admitting: Nurse Practitioner

## 2021-04-27 VITALS — BP 153/97 | HR 95 | Temp 98.6°F | Resp 19 | Ht 66.0 in | Wt 186.4 lb

## 2021-04-27 DIAGNOSIS — C9000 Multiple myeloma not having achieved remission: Secondary | ICD-10-CM | POA: Insufficient documentation

## 2021-04-27 DIAGNOSIS — D649 Anemia, unspecified: Secondary | ICD-10-CM | POA: Insufficient documentation

## 2021-04-27 DIAGNOSIS — I10 Essential (primary) hypertension: Secondary | ICD-10-CM | POA: Diagnosis not present

## 2021-04-27 DIAGNOSIS — Z923 Personal history of irradiation: Secondary | ICD-10-CM | POA: Insufficient documentation

## 2021-04-27 DIAGNOSIS — F419 Anxiety disorder, unspecified: Secondary | ICD-10-CM | POA: Insufficient documentation

## 2021-04-27 DIAGNOSIS — Z79899 Other long term (current) drug therapy: Secondary | ICD-10-CM | POA: Insufficient documentation

## 2021-04-27 DIAGNOSIS — C9001 Multiple myeloma in remission: Secondary | ICD-10-CM

## 2021-04-27 DIAGNOSIS — Z9221 Personal history of antineoplastic chemotherapy: Secondary | ICD-10-CM | POA: Diagnosis not present

## 2021-04-28 ENCOUNTER — Other Ambulatory Visit: Payer: Self-pay

## 2021-04-28 ENCOUNTER — Other Ambulatory Visit (HOSPITAL_COMMUNITY): Payer: Self-pay

## 2021-04-28 ENCOUNTER — Telehealth: Payer: Self-pay | Admitting: Hematology

## 2021-04-28 ENCOUNTER — Encounter: Payer: Self-pay | Admitting: Physical Therapy

## 2021-04-28 ENCOUNTER — Ambulatory Visit: Payer: BC Managed Care – PPO | Admitting: Physical Therapy

## 2021-04-28 DIAGNOSIS — M25561 Pain in right knee: Secondary | ICD-10-CM | POA: Diagnosis not present

## 2021-04-28 DIAGNOSIS — R293 Abnormal posture: Secondary | ICD-10-CM | POA: Diagnosis not present

## 2021-04-28 DIAGNOSIS — R252 Cramp and spasm: Secondary | ICD-10-CM | POA: Diagnosis not present

## 2021-04-28 DIAGNOSIS — C9000 Multiple myeloma not having achieved remission: Secondary | ICD-10-CM | POA: Diagnosis not present

## 2021-04-28 DIAGNOSIS — M25652 Stiffness of left hip, not elsewhere classified: Secondary | ICD-10-CM | POA: Diagnosis not present

## 2021-04-28 DIAGNOSIS — R5383 Other fatigue: Secondary | ICD-10-CM | POA: Diagnosis not present

## 2021-04-28 DIAGNOSIS — G8929 Other chronic pain: Secondary | ICD-10-CM | POA: Diagnosis not present

## 2021-04-28 DIAGNOSIS — M5442 Lumbago with sciatica, left side: Secondary | ICD-10-CM | POA: Diagnosis not present

## 2021-04-28 DIAGNOSIS — R2681 Unsteadiness on feet: Secondary | ICD-10-CM | POA: Diagnosis not present

## 2021-04-28 DIAGNOSIS — M6281 Muscle weakness (generalized): Secondary | ICD-10-CM | POA: Diagnosis not present

## 2021-04-28 DIAGNOSIS — R262 Difficulty in walking, not elsewhere classified: Secondary | ICD-10-CM | POA: Diagnosis not present

## 2021-04-28 DIAGNOSIS — R2689 Other abnormalities of gait and mobility: Secondary | ICD-10-CM | POA: Diagnosis not present

## 2021-04-28 MED ORDER — LENALIDOMIDE 10 MG PO CAPS: 10.0000 mg | ORAL_CAPSULE | Freq: Every day | ORAL | 0 refills | Status: DC

## 2021-04-28 MED ORDER — LENALIDOMIDE 10 MG PO CAPS
10.0000 mg | ORAL_CAPSULE | Freq: Every day | ORAL | 0 refills | Status: DC
  Filled 2021-04-28: qty 21, 21d supply, fill #0

## 2021-04-28 NOTE — Telephone Encounter (Signed)
Scheduled follow-up appointment per 11/29 los. Patient is aware.

## 2021-04-28 NOTE — Progress Notes (Signed)
Per staff message from Cira Rue, NP.  Please order for Revalimid 10mg  take 1 tab by mouth daily for 21 days and off 7 days.  Got approval [id number] from [facility].  Sent prescription to [facility] per Lacie's request.

## 2021-04-28 NOTE — Therapy (Signed)
[facility] @ [place], [place], [address] Phone: [phone]   Fax:  [phone]  Physical Therapy Treatment  Patient Details  Name: Hannah [name] MRN: [id number] Date of Birth: 1960/07/09 Referring Provider (PT): Cira Rue   Encounter Date: 04/28/2021   PT End of Session - 04/28/21 1100     Visit Number 28    Number of Visits 34    Date for PT Re-Evaluation 06/03/21    Authorization Type BCBS    PT Start Time 0930    PT Stop Time 1015    PT Time Calculation (min) 45 min    Activity Tolerance Patient tolerated treatment well    Behavior During Therapy [facility] for tasks assessed/performed             Past Medical History:  Diagnosis Date   Allergy    Anemia    Anxiety    Arthritis    Asthma    Depression    Hypertension     Past Surgical History:  Procedure Laterality Date   CHOLECYSTECTOMY  1993   COLONOSCOPY     TOTAL KNEE ARTHROPLASTY Right 01/28/2016   TOTAL KNEE ARTHROPLASTY Right 01/28/2016   Procedure: RIGHT TOTAL KNEE ARTHROPLASTY;  Surgeon: [name], MD;  Location: [facility];  Service: Orthopedics;  Laterality: Right;   TRIGGER FINGER RELEASE Right 06/18/2014   Procedure: RIGHT LONG FINGER TRIGGER RELEASE;  Surgeon: [name], MD;  Location: [facility];  Service: Orthopedics;  Laterality: Right;   TUBAL LIGATION     VAGINAL HYSTERECTOMY Bilateral 11/04/2016   Procedure: HYSTERECTOMY VAGINAL with Bilateral Salpingectomy;  Surgeon: [name], MD;  Location: [facility];  Service: Gynecology;  Laterality: Bilateral;   WEIL OSTEOTOMY Right 07/07/2020   Procedure: RIGHT FOOT WEIL OSTEOTOMY 2, 3, AND 4 METATARSALS AND PROXIMAL INTERPHALANGEAL JOINT FUSION 2 & 4 TOES;  Surgeon: Newt Minion, MD;  Location: [facility];  Service: Orthopedics;  Laterality: Right;    There were no vitals filed for this visit.   Subjective Assessment - 04/28/21 1100      Subjective Doing well this AM, no new complaints.    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 She as diagnosed in February 2022 with Multiple Myeloma not in remission. 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 No/denies            Treatment: Patient seen for aquatic therapy today.  Treatment took place in water 2.5-4 feet deep depending upon activity.  Pt entered the pool via stairs, step to step with mild use of rails. Water temp 94 degrees F. Pt requires buoyancy of water for support and to offload joints with strengthening exercises.  Pt utilizes viscosity of the water required for strengthening.   Seated water bench with 75% submersion Pt performed seated LE AROM exercises 20x in all planes, concurrent status update.  75% standing submersion: Water walking with increased speed 10x in each direction, VC to get UE moving as she sees fit. PPT against the wall for core compressions 5 sec hold 10x, then add the marching across the pool with small noodle 6x with VC to remember to engage her core. Triangle UE weights for shoulder horizontal add/abd 2x15:step ups 10x Bil LE on underwater step, decompression float with intermittent LE bicycle ( alternating  1 min each for 7 min total .)                                 PT Long Term Goals - 04/13/21 0836       PT LONG TERM GOAL #1   Title Pt will return to the gym    Status On-going      PT LONG TERM GOAL #4   Title Pt will be able to go up and down stairs at home    Status Achieved                   Plan - 04/28/21 1101     Clinical Impression Statement Pt arrives to aquatic PT with no complaints. Pt rarely needs to take a break throughout her sessions now. Today pt ended her session with decompression float as this was her only resting break. Pt able to use the resisatnce  of the water mrore to progress her strengthening AND without any excess fatigue.    Personal Factors and Comorbidities Comorbidity 3+    Comorbidities Multiple Myeloma, multi joint OA, recent foot surgery, fall risk,SCT    Examination-Activity Limitations Sleep;Squat;Stand;Stairs;Transfers;Locomotion Level;Sit    Stability/Clinical Decision Making Stable/Uncomplicated    Rehab Potential Good    PT Frequency 2x / week    PT Duration 8 weeks    PT Treatment/Interventions ADLs/Self Care Home Management;Aquatic Therapy;Gait training;Stair training;Functional mobility training;Therapeutic activities;Therapeutic exercise;Balance training;Neuromuscular re-education;Manual techniques;Patient/family education;Passive range of motion;Joint Manipulations    PT Next Visit Plan Continue conditioning/strength/aquatics             Patient will benefit from skilled therapeutic intervention in order to improve the following deficits and impairments:  Abnormal gait, Decreased mobility, Decreased activity tolerance, Decreased endurance, Decreased strength, Decreased balance, Decreased knowledge of precautions, Difficulty walking, Postural dysfunction, Pain  Visit Diagnosis: Abnormal posture  Multiple myeloma not having achieved remission (HCC)  Fatigue, unspecified type  Muscle weakness (generalized)     Problem List Patient Active Problem List   Diagnosis Date Noted   Multiple myeloma ([place]) 07/13/2020   Claw toe, right    Trigger finger, left middle finger 04/09/2020   Stenosing tenosynovitis of finger of left hand 02/27/2020   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   Major depression, recurrent ([place]) 01/12/2018   Chronic right shoulder pain 12/20/2017   MDD (major depressive disorder), recurrent episode, moderate ([place]) 11/04/2017   MGUS (monoclonal gammopathy of unknown significance) 10/24/2017   Primary osteoarthritis of both hands 09/26/2017   Primary osteoarthritis of right shoulder  09/26/2017   Primary osteoarthritis of both hips 09/26/2017   Status post total knee replacement, right 09/26/2017   Primary osteoarthritis of both feet 09/26/2017   History of asthma 09/26/2017   Primary osteoarthritis of left knee 04/18/2017   Menorrhagia 11/04/2016   Fibroid tumor 11/04/2016   Adenomyosis 11/04/2016   Hypertension, essential 11/04/2016   Total knee replacement status 01/28/2016    Zaylee Cornia, PTA 04/28/2021, 1:19 PM  [facility] @ [place], [place], [address] Phone: [phone]   Fax:  [phone]  Name: Hannah [name] MRN: [id number] Date of Birth: [birth year]

## 2021-04-29 ENCOUNTER — Telehealth: Payer: Self-pay

## 2021-04-29 DIAGNOSIS — Z9484 Stem cells transplant status: Secondary | ICD-10-CM | POA: Diagnosis not present

## 2021-04-29 DIAGNOSIS — C9000 Multiple myeloma not having achieved remission: Secondary | ICD-10-CM | POA: Diagnosis not present

## 2021-04-29 DIAGNOSIS — C9001 Multiple myeloma in remission: Secondary | ICD-10-CM

## 2021-04-29 NOTE — Telephone Encounter (Signed)
Oral Oncology Pharmacist Encounter  Received new prescription for lenalidomide (Revlimid) for the maintenance treatment of multiple myeloma following induction VRd and autologous stem cell transplant, planned duration until disease progression or unacceptable drug toxicity.  Labs from 02/24/21 assessed, no relevant lab abnormalities. Prescription dose and frequency assessed.   Current medication list in Epic reviewed, no DDIs with lenalidomide identified.  Evaluated chart and no patient barriers to medication adherence identified.   Prescription has been e-scribed to the [facility] for benefits analysis and approval.  Oral Oncology Clinic will continue to follow for insurance authorization, copayment issues, initial counseling and start date.  Benn Moulder, PharmD Pharmacy Resident  04/29/2021 10:03 AM

## 2021-04-29 NOTE — Telephone Encounter (Signed)
Oral Chemotherapy Pharmacist Encounter  Patient will receive lenalidomide (Revlimid) on 04/30/21 to start on 05/03/21.  Patient Education I spoke with patient for overview of new oral chemotherapy medication: lenalidomide (Revlimid) for the maintenance treatment of multiple myeloma following induction VRd and autologous stem cell transplant, planned duration until disease progression or unacceptable drug toxicity.  Pt is doing well. Counseled patient on administration, dosing, side effects, monitoring, drug-food interactions, safe handling, storage, and disposal. Patient will take lenalidomide 10 mg daily for 21 days, followed by 7 days off. Repeat every 28 days.   Side effects include but not limited to: constipation, diarrhea, thromboembolism, hepatotoxicity, fatigue, dizziness, headache, rash or itchiness, muscle aches or pains, nausea, and vomiting.    Reviewed with patient importance of keeping a medication schedule and plan for any missed doses.  After discussion with patient no patient barriers to medication adherence identified.  [facility] is [facility]. Medication will arrive on 04/30/21 to start on 05/03/21.    Ms. Schumm voiced understanding and appreciation. All questions answered. Medication handout provided.  Provided patient with [facility] phone number. Patient knows to call the office with questions or concerns. Oral Chemotherapy [facility] will continue to follow.  Benn Moulder, PharmD Pharmacy Resident  04/29/2021 10:09 AM

## 2021-05-03 ENCOUNTER — Other Ambulatory Visit: Payer: Self-pay

## 2021-05-03 ENCOUNTER — Ambulatory Visit: Payer: BC Managed Care – PPO

## 2021-05-03 DIAGNOSIS — Z9484 Stem cells transplant status: Secondary | ICD-10-CM | POA: Diagnosis not present

## 2021-05-03 DIAGNOSIS — M6281 Muscle weakness (generalized): Secondary | ICD-10-CM

## 2021-05-03 DIAGNOSIS — I10 Essential (primary) hypertension: Secondary | ICD-10-CM | POA: Diagnosis not present

## 2021-05-03 DIAGNOSIS — D509 Iron deficiency anemia, unspecified: Secondary | ICD-10-CM | POA: Diagnosis not present

## 2021-05-03 DIAGNOSIS — Z9079 Acquired absence of other genital organ(s): Secondary | ICD-10-CM | POA: Diagnosis not present

## 2021-05-03 DIAGNOSIS — R5383 Other fatigue: Secondary | ICD-10-CM | POA: Insufficient documentation

## 2021-05-03 DIAGNOSIS — Z882 Allergy status to sulfonamides status: Secondary | ICD-10-CM | POA: Diagnosis not present

## 2021-05-03 DIAGNOSIS — Z7961 Long term (current) use of immunomodulator: Secondary | ICD-10-CM | POA: Diagnosis not present

## 2021-05-03 DIAGNOSIS — R2689 Other abnormalities of gait and mobility: Secondary | ICD-10-CM | POA: Insufficient documentation

## 2021-05-03 DIAGNOSIS — F32A Depression, unspecified: Secondary | ICD-10-CM | POA: Diagnosis not present

## 2021-05-03 DIAGNOSIS — R293 Abnormal posture: Secondary | ICD-10-CM | POA: Insufficient documentation

## 2021-05-03 DIAGNOSIS — C9000 Multiple myeloma not having achieved remission: Secondary | ICD-10-CM

## 2021-05-03 DIAGNOSIS — R262 Difficulty in walking, not elsewhere classified: Secondary | ICD-10-CM

## 2021-05-03 DIAGNOSIS — M25652 Stiffness of left hip, not elsewhere classified: Secondary | ICD-10-CM

## 2021-05-03 DIAGNOSIS — M1990 Unspecified osteoarthritis, unspecified site: Secondary | ICD-10-CM | POA: Diagnosis not present

## 2021-05-03 DIAGNOSIS — F419 Anxiety disorder, unspecified: Secondary | ICD-10-CM | POA: Diagnosis not present

## 2021-05-03 DIAGNOSIS — Z923 Personal history of irradiation: Secondary | ICD-10-CM | POA: Diagnosis not present

## 2021-05-03 DIAGNOSIS — M5442 Lumbago with sciatica, left side: Secondary | ICD-10-CM | POA: Insufficient documentation

## 2021-05-03 DIAGNOSIS — G629 Polyneuropathy, unspecified: Secondary | ICD-10-CM | POA: Diagnosis not present

## 2021-05-03 DIAGNOSIS — Z79899 Other long term (current) drug therapy: Secondary | ICD-10-CM | POA: Diagnosis not present

## 2021-05-03 DIAGNOSIS — M48061 Spinal stenosis, lumbar region without neurogenic claudication: Secondary | ICD-10-CM | POA: Diagnosis not present

## 2021-05-03 DIAGNOSIS — E669 Obesity, unspecified: Secondary | ICD-10-CM | POA: Diagnosis not present

## 2021-05-03 DIAGNOSIS — Z9049 Acquired absence of other specified parts of digestive tract: Secondary | ICD-10-CM | POA: Diagnosis not present

## 2021-05-03 DIAGNOSIS — M25561 Pain in right knee: Secondary | ICD-10-CM | POA: Insufficient documentation

## 2021-05-03 DIAGNOSIS — R2681 Unsteadiness on feet: Secondary | ICD-10-CM | POA: Diagnosis not present

## 2021-05-03 DIAGNOSIS — M25651 Stiffness of right hip, not elsewhere classified: Secondary | ICD-10-CM | POA: Diagnosis not present

## 2021-05-03 DIAGNOSIS — G8929 Other chronic pain: Secondary | ICD-10-CM | POA: Diagnosis not present

## 2021-05-03 NOTE — Therapy (Signed)
[facility] @ [place], [place], [address] Phone: [phone]   Fax:  [phone]  Physical Therapy Treatment  Patient Details  Name: Hannah [name] MRN: [id number] Date of Birth: Feb 15, 1961 Referring Provider (PT): Cira Rue   Encounter Date: 05/03/2021   PT End of Session - 05/03/21 1055     Visit Number 29    Number of Visits 34    Date for PT Re-Evaluation 06/03/21    Authorization Type BCBS    PT Start Time 1007   pt late   PT Stop Time 1052    PT Time Calculation (min) 45 min    Activity Tolerance Patient tolerated treatment well    Behavior During Therapy [facility] for tasks assessed/performed             Past Medical History:  Diagnosis Date   Allergy    Anemia    Anxiety    Arthritis    Asthma    Depression    Hypertension     Past Surgical History:  Procedure Laterality Date   CHOLECYSTECTOMY  1993   COLONOSCOPY     TOTAL KNEE ARTHROPLASTY Right 01/28/2016   TOTAL KNEE ARTHROPLASTY Right 01/28/2016   Procedure: RIGHT TOTAL KNEE ARTHROPLASTY;  Surgeon: [name], MD;  Location: [facility];  Service: Orthopedics;  Laterality: Right;   TRIGGER FINGER RELEASE Right 06/18/2014   Procedure: RIGHT LONG FINGER TRIGGER RELEASE;  Surgeon: [name], MD;  Location: [facility];  Service: Orthopedics;  Laterality: Right;   TUBAL LIGATION     VAGINAL HYSTERECTOMY Bilateral 11/04/2016   Procedure: HYSTERECTOMY VAGINAL with Bilateral Salpingectomy;  Surgeon: [name], MD;  Location: [facility];  Service: Gynecology;  Laterality: Bilateral;   WEIL OSTEOTOMY Right 07/07/2020   Procedure: RIGHT FOOT WEIL OSTEOTOMY 2, 3, AND 4 METATARSALS AND PROXIMAL INTERPHALANGEAL JOINT FUSION 2 & 4 TOES;  Surgeon: Newt Minion, MD;  Location: [facility];  Service: Orthopedics;  Laterality: Right;    There were no vitals filed for this visit.   Subjective Assessment - 05/03/21  1007     Subjective The bone marrow biopsy went well, but my shoulder is really sore from the position they put me in..  Everything looked good though. I don't think I have any restrictions.  My tailbone is sore today. Did fine after last visit.    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 She as diagnosed in February 2022 with Multiple Myeloma not in remission. 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 no limits    How long can you stand comfortably? 38mn    How long can you walk comfortably? 558m    Diagnostic tests no scans scheduled recently    Currently in Pain? Yes    Pain Score 4     Pain Location Back    Pain Orientation Left;Lower    Pain Descriptors / Indicators Aching    Pain Type Chronic pain    Pain Onset More than a month ago    Pain Frequency Intermittent    Aggravating Factors  standing, walking, long periods of sitting    Multiple Pain Sites No  [facility] Adult PT Treatment/Exercise - 05/03/21 0001       Exercises   Other Exercises  Nustep seat 9, UE 9, lev. 5 x 6 min, on airex beam 6 lengths tandem and 6 lengths sidestepping. On airex sidestep and hold x 10 ea, front step and hold x 10 B, sitting ball squeeze x 10, supine ppt x10, supine piriformis stretch 2 x 20 sec, ppt with SLR 2 x5                          PT Long Term Goals - 04/13/21 0836       PT LONG TERM GOAL #1   Title Pt will return to the gym    Status On-going      PT LONG TERM GOAL #4   Title Pt will be able to go up and down stairs at home    Status Achieved                   Plan - 05/03/21 1056     Clinical Impression Statement Performed Nu step and standing balance activities and then focused on core strength and stretching for LBP.  Pt is very tight  in left piriformis area and has very limited ROM.  She will benefit from continued core strength and manual work to decrease LB and glut pain, LBP was better at completion of exercise    Personal Factors and Comorbidities Comorbidity 3+    Comorbidities Multiple Myeloma, multi joint OA, recent foot surgery, fall risk,SCT    Examination-Activity Limitations Sleep;Squat;Stand;Stairs;Transfers;Locomotion Level;Sit    Examination-Participation Restrictions Cleaning;Community Activity;Occupation;Shop    Stability/Clinical Decision Making Stable/Uncomplicated    Rehab Potential Good    PT Duration 8 weeks    PT Treatment/Interventions ADLs/Self Care Home Management;Aquatic Therapy;Gait training;Stair training;Functional mobility training;Therapeutic activities;Therapeutic exercise;Balance training;Neuromuscular re-education;Manual techniques;Patient/family education;Passive range of motion;Joint Manipulations    PT Next Visit Plan STM left LB and gluts, core strength/stabs    Consulted and Agree with Plan of Care Patient             Patient will benefit from skilled therapeutic intervention in order to improve the following deficits and impairments:  Abnormal gait, Decreased mobility, Decreased activity tolerance, Decreased endurance, Decreased strength, Decreased balance, Decreased knowledge of precautions, Difficulty walking, Postural dysfunction, Pain  Visit Diagnosis: Abnormal posture  Multiple myeloma not having achieved remission (HCC)  Muscle weakness (generalized)  Difficulty in walking, not elsewhere classified  Stiffness of left hip, not elsewhere classified     Problem List Patient Active Problem List   Diagnosis Date Noted   Multiple myeloma ([place]) 07/13/2020   Claw toe, right    Trigger finger, left middle finger 04/09/2020   Stenosing tenosynovitis of finger of left hand 02/27/2020   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   Major depression, recurrent ([place]) 01/12/2018    Chronic right shoulder pain 12/20/2017   MDD (major depressive disorder), recurrent episode, moderate ([place]) 11/04/2017   MGUS (monoclonal gammopathy of unknown significance) 10/24/2017   Primary osteoarthritis of both hands 09/26/2017   Primary osteoarthritis of right shoulder 09/26/2017   Primary osteoarthritis of both hips 09/26/2017   Status post total knee replacement, right 09/26/2017   Primary osteoarthritis of both feet 09/26/2017   History of asthma 09/26/2017   Primary osteoarthritis of left knee 04/18/2017   Menorrhagia 11/04/2016   Fibroid tumor 11/04/2016   Adenomyosis 11/04/2016   Hypertension, essential 11/04/2016   Total knee replacement  status 01/28/2016    Claris Pong, PT 05/03/2021, 12:13 PM  [facility] @ [place], [place], [address] Phone: [phone]   Fax:  [phone]  Name: Hannah [name] MRN: [id number] Date of Birth: 09-17-1960

## 2021-05-05 ENCOUNTER — Encounter: Payer: Self-pay | Admitting: Physical Therapy

## 2021-05-05 ENCOUNTER — Other Ambulatory Visit: Payer: Self-pay

## 2021-05-05 ENCOUNTER — Ambulatory Visit: Payer: BC Managed Care – PPO | Admitting: Physical Therapy

## 2021-05-05 DIAGNOSIS — Z923 Personal history of irradiation: Secondary | ICD-10-CM | POA: Diagnosis not present

## 2021-05-05 DIAGNOSIS — F419 Anxiety disorder, unspecified: Secondary | ICD-10-CM | POA: Diagnosis not present

## 2021-05-05 DIAGNOSIS — Z79899 Other long term (current) drug therapy: Secondary | ICD-10-CM | POA: Diagnosis not present

## 2021-05-05 DIAGNOSIS — I10 Essential (primary) hypertension: Secondary | ICD-10-CM | POA: Diagnosis not present

## 2021-05-05 DIAGNOSIS — R293 Abnormal posture: Secondary | ICD-10-CM | POA: Diagnosis not present

## 2021-05-05 DIAGNOSIS — C9000 Multiple myeloma not having achieved remission: Secondary | ICD-10-CM

## 2021-05-05 DIAGNOSIS — F32A Depression, unspecified: Secondary | ICD-10-CM | POA: Diagnosis not present

## 2021-05-05 DIAGNOSIS — M48061 Spinal stenosis, lumbar region without neurogenic claudication: Secondary | ICD-10-CM | POA: Diagnosis not present

## 2021-05-05 DIAGNOSIS — R2689 Other abnormalities of gait and mobility: Secondary | ICD-10-CM

## 2021-05-05 DIAGNOSIS — Z9049 Acquired absence of other specified parts of digestive tract: Secondary | ICD-10-CM | POA: Diagnosis not present

## 2021-05-05 DIAGNOSIS — Z882 Allergy status to sulfonamides status: Secondary | ICD-10-CM | POA: Diagnosis not present

## 2021-05-05 DIAGNOSIS — G8929 Other chronic pain: Secondary | ICD-10-CM

## 2021-05-05 DIAGNOSIS — M6281 Muscle weakness (generalized): Secondary | ICD-10-CM

## 2021-05-05 DIAGNOSIS — R262 Difficulty in walking, not elsewhere classified: Secondary | ICD-10-CM

## 2021-05-05 DIAGNOSIS — M25652 Stiffness of left hip, not elsewhere classified: Secondary | ICD-10-CM

## 2021-05-05 DIAGNOSIS — E669 Obesity, unspecified: Secondary | ICD-10-CM | POA: Diagnosis not present

## 2021-05-05 DIAGNOSIS — Z7961 Long term (current) use of immunomodulator: Secondary | ICD-10-CM | POA: Diagnosis not present

## 2021-05-05 DIAGNOSIS — M1990 Unspecified osteoarthritis, unspecified site: Secondary | ICD-10-CM | POA: Diagnosis not present

## 2021-05-05 DIAGNOSIS — Z9079 Acquired absence of other genital organ(s): Secondary | ICD-10-CM | POA: Diagnosis not present

## 2021-05-05 DIAGNOSIS — R5383 Other fatigue: Secondary | ICD-10-CM

## 2021-05-05 DIAGNOSIS — R2681 Unsteadiness on feet: Secondary | ICD-10-CM

## 2021-05-05 DIAGNOSIS — G629 Polyneuropathy, unspecified: Secondary | ICD-10-CM | POA: Diagnosis not present

## 2021-05-05 DIAGNOSIS — D509 Iron deficiency anemia, unspecified: Secondary | ICD-10-CM | POA: Diagnosis not present

## 2021-05-05 NOTE — Therapy (Signed)
[facility] @ [address] [place] [place], [place], [address] Phone: [phone]   Fax:  [phone]  Physical Therapy Treatment  Patient Details  Name: Hannah [name] MRN: [id number] Date of Birth: [birth year] Referring Provider (PT): Cira Rue   Encounter Date: 05/05/2021   PT End of Session - 05/05/21 0935     Visit Number 30    Number of Visits 34    Date for PT Re-Evaluation 06/03/21    Authorization Type BCBS    PT Start Time 0934    PT Stop Time 1027    PT Time Calculation (min) 53 min    Activity Tolerance Patient tolerated treatment well    Behavior During Therapy [facility] for tasks assessed/performed             Past Medical History:  Diagnosis Date   Allergy    Anemia    Anxiety    Arthritis    Asthma    Depression    Hypertension     Past Surgical History:  Procedure Laterality Date   CHOLECYSTECTOMY  1993   COLONOSCOPY     TOTAL KNEE ARTHROPLASTY Right 01/28/2016   TOTAL KNEE ARTHROPLASTY Right 01/28/2016   Procedure: RIGHT TOTAL KNEE ARTHROPLASTY;  Surgeon: [name], MD;  Location: [facility];  Service: Orthopedics;  Laterality: Right;   TRIGGER FINGER RELEASE Right 06/18/2014   Procedure: RIGHT LONG FINGER TRIGGER RELEASE;  Surgeon: [name], MD;  Location: [facility];  Service: Orthopedics;  Laterality: Right;   TUBAL LIGATION     VAGINAL HYSTERECTOMY Bilateral 11/04/2016   Procedure: HYSTERECTOMY VAGINAL with Bilateral Salpingectomy;  Surgeon: [name], MD;  Location: [facility];  Service: Gynecology;  Laterality: Bilateral;   WEIL OSTEOTOMY Right 07/07/2020   Procedure: RIGHT FOOT WEIL OSTEOTOMY 2, 3, AND 4 METATARSALS AND PROXIMAL INTERPHALANGEAL JOINT FUSION 2 & 4 TOES;  Surgeon: Newt Minion, MD;  Location: [facility];  Service: Orthopedics;  Laterality: Right;    There were no vitals filed for this visit.   Subjective Assessment - 05/05/21 1106      Subjective I have been trying to do more, nap less during the day. No pain but my RT hip does feel tight.    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 She as diagnosed in February 2022 with Multiple Myeloma not in remission. 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 No/denies    Multiple Pain Sites No            Treatment: Patient seen for aquatic therapy today.  Treatment took place in water 2.5-4 feet deep depending upon activity.  Pt entered the pool via stairs, step to step probably out of caution only, and mod use of hand rails. Water temp 93 degrees F. Pt requires buoyancy of water for support and to offload joints with strengthening exercises.    Seated decompression with large noodle behind patient to stretch the low back and hips and to provide relaxation. This was f/b LE bicycle 1.5 min with 1 min rest break, 3 bouts.   75% standing submersion: Hip circumduction 10x each direction Bil, core compressions with neck pillow 5 sec hold 10x f/b marching 4x across pool focusing on core, shoulder horizontal abd/add 2x20 with blue trianlges, forward & backward walking 6x  Pt utlized the  jets to assit in soft tissue mobilization for her low back and hip.                                  PT Long Term Goals - 04/13/21 0836       PT LONG TERM GOAL #1   Title Pt will return to the gym    Status On-going      PT LONG TERM GOAL #4   Title Pt will be able to go up and down stairs at home    Status Achieved                   Plan - 05/05/21 0935     Clinical Impression Statement Pt arrives today for aquatic PT session with no pain, does report RT hip stiffness/tightness. pt was able to complete 1.5 min of underwater bicycle 3 bouts. AROM for RT hip was helpful to "loosen up the hip." Pt was able to utilize the jets in the  pool to provide soft tissue mobilization to her low back and hip.    Personal Factors and Comorbidities Comorbidity 3+    Comorbidities Multiple Myeloma, multi joint OA, recent foot surgery, fall risk,SCT    Examination-Activity Limitations Sleep;Squat;Stand;Stairs;Transfers;Locomotion Level;Sit    Stability/Clinical Decision Making Stable/Uncomplicated    Rehab Potential Good    PT Duration 8 weeks    PT Treatment/Interventions ADLs/Self Care Home Management;Aquatic Therapy;Gait training;Stair training;Functional mobility training;Therapeutic activities;Therapeutic exercise;Balance training;Neuromuscular re-education;Manual techniques;Patient/family education;Passive range of motion;Joint Manipulations    PT Next Visit Plan STM left LB and gluts, core strength/stabs    Consulted and Agree with Plan of Care Patient             Patient will benefit from skilled therapeutic intervention in order to improve the following deficits and impairments:  Abnormal gait, Decreased mobility, Decreased activity tolerance, Decreased endurance, Decreased strength, Decreased balance, Decreased knowledge of precautions, Difficulty walking, Postural dysfunction, Pain  Visit Diagnosis: Abnormal posture  Multiple myeloma not having achieved remission (HCC)  Muscle weakness (generalized)  Difficulty in walking, not elsewhere classified  Stiffness of left hip, not elsewhere classified  Fatigue, unspecified type  Chronic pain of right knee  Other abnormalities of gait and mobility  Unsteadiness on feet  Chronic low back pain with left-sided sciatica, unspecified back pain laterality     Problem List Patient Active Problem List   Diagnosis Date Noted   Multiple myeloma ([place]) 07/13/2020   Claw toe, right    Trigger finger, left middle finger 04/09/2020   Stenosing tenosynovitis of finger of left hand 02/27/2020   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   Major depression, recurrent ([place])  01/12/2018   Chronic right shoulder pain 12/20/2017   MDD (major depressive disorder), recurrent episode, moderate ([place]) 11/04/2017   MGUS (monoclonal gammopathy of unknown significance) 10/24/2017   Primary osteoarthritis of both hands 09/26/2017   Primary osteoarthritis of right shoulder 09/26/2017   Primary osteoarthritis of both hips 09/26/2017   Status post total knee replacement, right 09/26/2017   Primary osteoarthritis of both feet 09/26/2017   History of asthma 09/26/2017   Primary osteoarthritis of left knee 04/18/2017   Menorrhagia 11/04/2016   Fibroid tumor 11/04/2016   Adenomyosis 11/04/2016   Hypertension, essential 11/04/2016   Total knee replacement status 01/28/2016    Kaylen Motl, PTA 05/05/2021, 11:11 AM  [facility] @ [place] [address]  [place], [place], [address] Phone: [phone]   Fax:  [phone]  Name: CEDRA VILLALON MRN: [id number] Date of Birth: [birth year]

## 2021-05-07 ENCOUNTER — Ambulatory Visit: Payer: Self-pay | Admitting: Physical Therapy

## 2021-05-07 ENCOUNTER — Ambulatory Visit: Payer: BC Managed Care – PPO | Admitting: Physical Therapy

## 2021-05-10 ENCOUNTER — Other Ambulatory Visit: Payer: Self-pay

## 2021-05-10 ENCOUNTER — Ambulatory Visit: Payer: BC Managed Care – PPO

## 2021-05-10 DIAGNOSIS — C9000 Multiple myeloma not having achieved remission: Secondary | ICD-10-CM | POA: Diagnosis not present

## 2021-05-10 DIAGNOSIS — Z923 Personal history of irradiation: Secondary | ICD-10-CM | POA: Diagnosis not present

## 2021-05-10 DIAGNOSIS — R293 Abnormal posture: Secondary | ICD-10-CM | POA: Diagnosis not present

## 2021-05-10 DIAGNOSIS — R5383 Other fatigue: Secondary | ICD-10-CM

## 2021-05-10 DIAGNOSIS — F32A Depression, unspecified: Secondary | ICD-10-CM | POA: Diagnosis not present

## 2021-05-10 DIAGNOSIS — M6281 Muscle weakness (generalized): Secondary | ICD-10-CM

## 2021-05-10 DIAGNOSIS — M1990 Unspecified osteoarthritis, unspecified site: Secondary | ICD-10-CM | POA: Diagnosis not present

## 2021-05-10 DIAGNOSIS — Z79899 Other long term (current) drug therapy: Secondary | ICD-10-CM | POA: Diagnosis not present

## 2021-05-10 DIAGNOSIS — Z882 Allergy status to sulfonamides status: Secondary | ICD-10-CM | POA: Diagnosis not present

## 2021-05-10 DIAGNOSIS — Z7961 Long term (current) use of immunomodulator: Secondary | ICD-10-CM | POA: Diagnosis not present

## 2021-05-10 DIAGNOSIS — E669 Obesity, unspecified: Secondary | ICD-10-CM | POA: Diagnosis not present

## 2021-05-10 DIAGNOSIS — R262 Difficulty in walking, not elsewhere classified: Secondary | ICD-10-CM

## 2021-05-10 DIAGNOSIS — F419 Anxiety disorder, unspecified: Secondary | ICD-10-CM | POA: Diagnosis not present

## 2021-05-10 DIAGNOSIS — G629 Polyneuropathy, unspecified: Secondary | ICD-10-CM | POA: Diagnosis not present

## 2021-05-10 DIAGNOSIS — I10 Essential (primary) hypertension: Secondary | ICD-10-CM | POA: Diagnosis not present

## 2021-05-10 DIAGNOSIS — Z9079 Acquired absence of other genital organ(s): Secondary | ICD-10-CM | POA: Diagnosis not present

## 2021-05-10 DIAGNOSIS — D509 Iron deficiency anemia, unspecified: Secondary | ICD-10-CM | POA: Diagnosis not present

## 2021-05-10 DIAGNOSIS — Z9049 Acquired absence of other specified parts of digestive tract: Secondary | ICD-10-CM | POA: Diagnosis not present

## 2021-05-10 DIAGNOSIS — M48061 Spinal stenosis, lumbar region without neurogenic claudication: Secondary | ICD-10-CM | POA: Diagnosis not present

## 2021-05-10 NOTE — Therapy (Signed)
[facility] @ [place], [place], [address] Phone: [phone]   Fax:  [phone]  Physical Therapy Treatment  Patient Details  Name: Hannah [name] MRN: [id number] Date of Birth: [birth year] Referring Provider (PT): Cira Rue   Encounter Date: 05/10/2021   PT End of Session - 05/10/21 0809     Visit Number 31    Number of Visits 34    Date for PT Re-Evaluation 06/03/21    Authorization Type BCBS    PT Start Time 0802    Activity Tolerance Patient tolerated treatment well    Behavior During Therapy [facility] for tasks assessed/performed             Past Medical History:  Diagnosis Date   Allergy    Anemia    Anxiety    Arthritis    Asthma    Depression    Hypertension     Past Surgical History:  Procedure Laterality Date   CHOLECYSTECTOMY  1993   COLONOSCOPY     TOTAL KNEE ARTHROPLASTY Right 01/28/2016   TOTAL KNEE ARTHROPLASTY Right 01/28/2016   Procedure: RIGHT TOTAL KNEE ARTHROPLASTY;  Surgeon: [name], MD;  Location: [facility];  Service: Orthopedics;  Laterality: Right;   TRIGGER FINGER RELEASE Right 06/18/2014   Procedure: RIGHT LONG FINGER TRIGGER RELEASE;  Surgeon: [name], MD;  Location: [facility];  Service: Orthopedics;  Laterality: Right;   TUBAL LIGATION     VAGINAL HYSTERECTOMY Bilateral 11/04/2016   Procedure: HYSTERECTOMY VAGINAL with Bilateral Salpingectomy;  Surgeon: [name], MD;  Location: [facility];  Service: Gynecology;  Laterality: Bilateral;   WEIL OSTEOTOMY Right 07/07/2020   Procedure: RIGHT FOOT WEIL OSTEOTOMY 2, 3, AND 4 METATARSALS AND PROXIMAL INTERPHALANGEAL JOINT FUSION 2 & 4 TOES;  Surgeon: Newt Minion, MD;  Location: [facility];  Service: Orthopedics;  Laterality: Right;    There were no vitals filed for this visit.   Subjective Assessment - 05/10/21 0801     Subjective I don't have to wear gloves anymore. BAck and  hip is a little better, but my right shoulder has been paining me. I feel like there is some weakness in my arm and I have a hard time sleeping at night.    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 She as diagnosed in February 2022 with Multiple Myeloma not in remission. 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 no limits    How long can you stand comfortably? 66mn    How long can you walk comfortably? 517m    Diagnostic tests no scans scheduled recently    Patient Stated Goals get strong, build up the muscles    Currently in Pain? Yes    Pain Score 3     Pain Location Back    Pain Orientation central  LB   Pain Descriptors / Indicators Aching    Pain Onset More than a month ago    Pain Frequency Intermittent    Multiple Pain Sites Yes    Pain Score 5    Pain Location Shoulder    Pain Orientation Right    Pain Descriptors / Indicators Aching;Sharp    Pain Type Chronic pain    Pain Onset More than a month ago  Pain Frequency Constant                               [facility] Adult PT Treatment/Exercise - 05/10/21 0001       Exercises   Other Exercises  Nustep seat 9, UE 9, lev. 5 x 6 min, walking on airex beam 6 lengths tandem and 6 lengths sidestepping. On airex sidestep and hold x 10 ea, front step and hold x 10 B,  supine ppt x10,  supine LTR x 3 ea, SKTC x 3, ea.,supine piriformis stretch 2 x 20 sec, ppt with SLR 2 x5      Manual Therapy   Soft tissue mobilization in prone with pillow under stomach and bolster under ankles; STW to bilateral QL/PSIS area and bilateral gluts to decrease pain.                          PT Long Term Goals - 04/13/21 0836       PT LONG TERM GOAL #1   Title Pt will return to the gym    Status On-going      PT LONG TERM GOAL #4   Title Pt will  be able to go up and down stairs at home    Status Achieved                   Plan - 05/10/21 0809     Clinical Impression Statement Pt is improving with balance on the airex, but had one episode with sidestepping where her left leg gave a little and she felt week momentarily. Left hip still very tight with piriformis stretching but did not feel tight with manual work to piriformis.  No back pain at completion of rx.    Personal Factors and Comorbidities Comorbidity 3+    Comorbidities Multiple Myeloma, multi joint OA, recent foot surgery, fall risk,SCT    Examination-Activity Limitations Sleep;Squat;Stand;Stairs;Transfers;Locomotion Level;Sit    Examination-Participation Restrictions Cleaning;Community Activity;Occupation;Shop    Stability/Clinical Decision Making Stable/Uncomplicated    Rehab Potential Good    PT Frequency 2x / week    PT Duration 8 weeks    PT Treatment/Interventions ADLs/Self Care Home Management;Aquatic Therapy;Gait training;Stair training;Functional mobility training;Therapeutic activities;Therapeutic exercise;Balance training;Neuromuscular re-education;Manual techniques;Patient/family education;Passive range of motion;Joint Manipulations    PT Next Visit Plan STM left LB and gluts, core strength/stabs    PT Home Exercise Plan mini squats, sidestepping, step ups,stretches,ppt,    Consulted and Agree with Plan of Care Patient             Patient will benefit from skilled therapeutic intervention in order to improve the following deficits and impairments:  Abnormal gait, Decreased mobility, Decreased activity tolerance, Decreased endurance, Decreased strength, Decreased balance, Decreased knowledge of precautions, Difficulty walking, Postural dysfunction, Pain  Visit Diagnosis: Multiple myeloma not having achieved remission (HCC)  Muscle weakness (generalized)  Difficulty in walking, not elsewhere classified  Fatigue, unspecified type     Problem  List Patient Active Problem List   Diagnosis Date Noted   Multiple myeloma ([place]) 07/13/2020   Claw toe, right    Trigger finger, left middle finger 04/09/2020   Stenosing tenosynovitis of finger of left hand 02/27/2020   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   Major depression, recurrent ([place]) 01/12/2018   Chronic right shoulder pain 12/20/2017   MDD (major depressive disorder), recurrent episode, moderate ([place]) 11/04/2017   MGUS (monoclonal gammopathy  of unknown significance) 10/24/2017   Primary osteoarthritis of both hands 09/26/2017   Primary osteoarthritis of right shoulder 09/26/2017   Primary osteoarthritis of both hips 09/26/2017   Status post total knee replacement, right 09/26/2017   Primary osteoarthritis of both feet 09/26/2017   History of asthma 09/26/2017   Primary osteoarthritis of left knee 04/18/2017   Menorrhagia 11/04/2016   Fibroid tumor 11/04/2016   Adenomyosis 11/04/2016   Hypertension, essential 11/04/2016   Total knee replacement status 01/28/2016    Claris Pong, PT 05/10/2021, 9:05 AM  [facility] @ [address] [place], [place], [address] Phone: [phone]   Fax:  [phone]  Name: Hannah [name] MRN: [id number] Date of Birth: Nov 10, 1960

## 2021-05-14 ENCOUNTER — Other Ambulatory Visit: Payer: Self-pay

## 2021-05-14 ENCOUNTER — Ambulatory Visit: Payer: BC Managed Care – PPO | Admitting: Physical Therapy

## 2021-05-14 ENCOUNTER — Encounter: Payer: Self-pay | Admitting: Physical Therapy

## 2021-05-14 DIAGNOSIS — Z9079 Acquired absence of other genital organ(s): Secondary | ICD-10-CM | POA: Diagnosis not present

## 2021-05-14 DIAGNOSIS — E669 Obesity, unspecified: Secondary | ICD-10-CM | POA: Diagnosis not present

## 2021-05-14 DIAGNOSIS — Z79899 Other long term (current) drug therapy: Secondary | ICD-10-CM | POA: Diagnosis not present

## 2021-05-14 DIAGNOSIS — M48061 Spinal stenosis, lumbar region without neurogenic claudication: Secondary | ICD-10-CM | POA: Diagnosis not present

## 2021-05-14 DIAGNOSIS — Z882 Allergy status to sulfonamides status: Secondary | ICD-10-CM | POA: Diagnosis not present

## 2021-05-14 DIAGNOSIS — M6281 Muscle weakness (generalized): Secondary | ICD-10-CM

## 2021-05-14 DIAGNOSIS — D509 Iron deficiency anemia, unspecified: Secondary | ICD-10-CM | POA: Diagnosis not present

## 2021-05-14 DIAGNOSIS — F32A Depression, unspecified: Secondary | ICD-10-CM | POA: Diagnosis not present

## 2021-05-14 DIAGNOSIS — F419 Anxiety disorder, unspecified: Secondary | ICD-10-CM | POA: Diagnosis not present

## 2021-05-14 DIAGNOSIS — I10 Essential (primary) hypertension: Secondary | ICD-10-CM | POA: Diagnosis not present

## 2021-05-14 DIAGNOSIS — C9000 Multiple myeloma not having achieved remission: Secondary | ICD-10-CM

## 2021-05-14 DIAGNOSIS — Z9049 Acquired absence of other specified parts of digestive tract: Secondary | ICD-10-CM | POA: Diagnosis not present

## 2021-05-14 DIAGNOSIS — R5383 Other fatigue: Secondary | ICD-10-CM

## 2021-05-14 DIAGNOSIS — R262 Difficulty in walking, not elsewhere classified: Secondary | ICD-10-CM

## 2021-05-14 DIAGNOSIS — Z923 Personal history of irradiation: Secondary | ICD-10-CM | POA: Diagnosis not present

## 2021-05-14 DIAGNOSIS — M1990 Unspecified osteoarthritis, unspecified site: Secondary | ICD-10-CM | POA: Diagnosis not present

## 2021-05-14 DIAGNOSIS — Z7961 Long term (current) use of immunomodulator: Secondary | ICD-10-CM | POA: Diagnosis not present

## 2021-05-14 DIAGNOSIS — G629 Polyneuropathy, unspecified: Secondary | ICD-10-CM | POA: Diagnosis not present

## 2021-05-14 DIAGNOSIS — R293 Abnormal posture: Secondary | ICD-10-CM | POA: Diagnosis not present

## 2021-05-14 NOTE — Therapy (Signed)
[facility] @ [place], [place], [address] Phone: [phone]   Fax:  [phone]  Physical Therapy Treatment  Patient Details  Name: Hannah [name] MRN: [id number] Date of Birth: Feb 22, 1961 Referring Provider (PT): Cira Rue   Encounter Date: 05/14/2021   PT End of Session - 05/14/21 1459     Visit Number 32    Number of Visits 34    Date for PT Re-Evaluation 06/03/21    Authorization Type BCBS    PT Start Time 1325    PT Stop Time 1413    PT Time Calculation (min) 48 min    Activity Tolerance Patient tolerated treatment well    Behavior During Therapy [facility] for tasks assessed/performed             Past Medical History:  Diagnosis Date   Allergy    Anemia    Anxiety    Arthritis    Asthma    Depression    Hypertension     Past Surgical History:  Procedure Laterality Date   CHOLECYSTECTOMY  1993   COLONOSCOPY     TOTAL KNEE ARTHROPLASTY Right 01/28/2016   TOTAL KNEE ARTHROPLASTY Right 01/28/2016   Procedure: RIGHT TOTAL KNEE ARTHROPLASTY;  Surgeon: [name], MD;  Location: [facility];  Service: Orthopedics;  Laterality: Right;   TRIGGER FINGER RELEASE Right 06/18/2014   Procedure: RIGHT LONG FINGER TRIGGER RELEASE;  Surgeon: [name], MD;  Location: [facility];  Service: Orthopedics;  Laterality: Right;   TUBAL LIGATION     VAGINAL HYSTERECTOMY Bilateral 11/04/2016   Procedure: HYSTERECTOMY VAGINAL with Bilateral Salpingectomy;  Surgeon: [name], MD;  Location: [facility];  Service: Gynecology;  Laterality: Bilateral;   WEIL OSTEOTOMY Right 07/07/2020   Procedure: RIGHT FOOT WEIL OSTEOTOMY 2, 3, AND 4 METATARSALS AND PROXIMAL INTERPHALANGEAL JOINT FUSION 2 & 4 TOES;  Surgeon: Newt Minion, MD;  Location: [facility];  Service: Orthopedics;  Laterality: Right;    There were no vitals filed for this visit.   Subjective Assessment - 05/14/21 1458      Subjective No complaints today, doing well. Preparing for party at my house tomorrow.    Currently in Pain? No/denies              Treatment: Patient seen for aquatic therapy today.  Treatment took place in water 2.5-4 feet deep depending upon activity.  Pt entered the pool via stairs. Water temp 94 degrees F. Pt requires buoyancy of water for support and to offload joints with strengthening exercises.    Seated water bench with 75% submersion Pt performed seated LE AROM exercises 20x in all planes, discussed status concurrent. Added 2# ankle weights 10x LAQ.   75% depth: Standing water walking 10x with blue noodle push/pull. Side stepping with hand paddles 10x. High knee marching with blue noodle for postural support 6x f/b core compressions 5 sec hold 10x. Bil hip abd 20x, decompression float seated 2 min for rest break. Underwater bicycle 4 min 4x with yellow noodle behind pt. UE weights for horizontal abd/add with core activation.                                PT Long Term Goals - 04/13/21 0836       PT LONG TERM GOAL #1   Title Pt will return to the gym  Status On-going      PT LONG TERM GOAL #4   Title Pt will be able to go up and down stairs at home    Status Achieved                   Plan - 05/14/21 1500     Clinical Impression Statement Pt tolerated 48 min the pool with only 1 rest break due to improving muscular endurance. Pt had no pain complaints of pain throughout session. Pt tolerated adding resistive devices such as ankle weightts and hand paddles to progress her strength.    Personal Factors and Comorbidities Comorbidity 3+    Comorbidities Multiple Myeloma, multi joint OA, recent foot surgery, fall risk,SCT    Examination-Activity Limitations Sleep;Squat;Stand;Stairs;Transfers;Locomotion Level;Sit    Stability/Clinical Decision Making Stable/Uncomplicated    PT Frequency 2x / week    PT Treatment/Interventions ADLs/Self  Care Home Management;Aquatic Therapy;Gait training;Stair training;Functional mobility training;Therapeutic activities;Therapeutic exercise;Balance training;Neuromuscular re-education;Manual techniques;Patient/family education;Passive range of motion;Joint Manipulations    PT Next Visit Plan STM left LB and gluts, core strength/stabs    Consulted and Agree with Plan of Care Patient             Patient will benefit from skilled therapeutic intervention in order to improve the following deficits and impairments:  Abnormal gait, Decreased mobility, Decreased activity tolerance, Decreased endurance, Decreased strength, Decreased balance, Decreased knowledge of precautions, Difficulty walking, Postural dysfunction, Pain  Visit Diagnosis: Muscle weakness (generalized)  Difficulty in walking, not elsewhere classified  Multiple myeloma not having achieved remission (HCC)  Fatigue, unspecified type     Problem List Patient Active Problem List   Diagnosis Date Noted   Multiple myeloma ([place]) 07/13/2020   Claw toe, right    Trigger finger, left middle finger 04/09/2020   Stenosing tenosynovitis of finger of left hand 02/27/2020   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   Major depression, recurrent ([place]) 01/12/2018   Chronic right shoulder pain 12/20/2017   MDD (major depressive disorder), recurrent episode, moderate ([place]) 11/04/2017   MGUS (monoclonal gammopathy of unknown significance) 10/24/2017   Primary osteoarthritis of both hands 09/26/2017   Primary osteoarthritis of right shoulder 09/26/2017   Primary osteoarthritis of both hips 09/26/2017   Status post total knee replacement, right 09/26/2017   Primary osteoarthritis of both feet 09/26/2017   History of asthma 09/26/2017   Primary osteoarthritis of left knee 04/18/2017   Menorrhagia 11/04/2016   Fibroid tumor 11/04/2016   Adenomyosis 11/04/2016   Hypertension, essential 11/04/2016   Total knee replacement status 01/28/2016     Kamel Haven, PTA 05/14/2021, 3:04 PM  [facility] @ [place], [place], [address] Phone: [phone]   Fax:  [phone]  Name: Hannah [name] MRN: [id number] Date of Birth: Jul 17, 1960

## 2021-05-18 ENCOUNTER — Ambulatory Visit: Payer: BC Managed Care – PPO

## 2021-05-18 ENCOUNTER — Other Ambulatory Visit: Payer: Self-pay

## 2021-05-18 DIAGNOSIS — I10 Essential (primary) hypertension: Secondary | ICD-10-CM | POA: Diagnosis not present

## 2021-05-18 DIAGNOSIS — Z9049 Acquired absence of other specified parts of digestive tract: Secondary | ICD-10-CM | POA: Diagnosis not present

## 2021-05-18 DIAGNOSIS — Z79899 Other long term (current) drug therapy: Secondary | ICD-10-CM | POA: Diagnosis not present

## 2021-05-18 DIAGNOSIS — M1990 Unspecified osteoarthritis, unspecified site: Secondary | ICD-10-CM | POA: Diagnosis not present

## 2021-05-18 DIAGNOSIS — C9000 Multiple myeloma not having achieved remission: Secondary | ICD-10-CM | POA: Diagnosis not present

## 2021-05-18 DIAGNOSIS — R262 Difficulty in walking, not elsewhere classified: Secondary | ICD-10-CM

## 2021-05-18 DIAGNOSIS — Z7961 Long term (current) use of immunomodulator: Secondary | ICD-10-CM | POA: Diagnosis not present

## 2021-05-18 DIAGNOSIS — Z923 Personal history of irradiation: Secondary | ICD-10-CM | POA: Diagnosis not present

## 2021-05-18 DIAGNOSIS — M6281 Muscle weakness (generalized): Secondary | ICD-10-CM

## 2021-05-18 DIAGNOSIS — Z882 Allergy status to sulfonamides status: Secondary | ICD-10-CM | POA: Diagnosis not present

## 2021-05-18 DIAGNOSIS — R5383 Other fatigue: Secondary | ICD-10-CM

## 2021-05-18 DIAGNOSIS — M48061 Spinal stenosis, lumbar region without neurogenic claudication: Secondary | ICD-10-CM | POA: Diagnosis not present

## 2021-05-18 DIAGNOSIS — D509 Iron deficiency anemia, unspecified: Secondary | ICD-10-CM | POA: Diagnosis not present

## 2021-05-18 DIAGNOSIS — F419 Anxiety disorder, unspecified: Secondary | ICD-10-CM | POA: Diagnosis not present

## 2021-05-18 DIAGNOSIS — F32A Depression, unspecified: Secondary | ICD-10-CM | POA: Diagnosis not present

## 2021-05-18 DIAGNOSIS — G629 Polyneuropathy, unspecified: Secondary | ICD-10-CM | POA: Diagnosis not present

## 2021-05-18 DIAGNOSIS — E669 Obesity, unspecified: Secondary | ICD-10-CM | POA: Diagnosis not present

## 2021-05-18 DIAGNOSIS — R293 Abnormal posture: Secondary | ICD-10-CM | POA: Diagnosis not present

## 2021-05-18 DIAGNOSIS — Z9079 Acquired absence of other genital organ(s): Secondary | ICD-10-CM | POA: Diagnosis not present

## 2021-05-18 NOTE — Therapy (Signed)
[facility] @ [place], [place], [address] Phone: [phone]   Fax:  [phone]  Physical Therapy Treatment  Patient Details  Name: Hannah [name] MRN: [id number] Date of Birth: [birth year] Referring Provider (PT): Cira Rue   Encounter Date: 05/18/2021   PT End of Session - 05/18/21 0856     Visit Number 34    Number of Visits 50    Date for PT Re-Evaluation 07/13/21    Authorization Type [id number]    PT Start Time 0815    PT Stop Time 0856    PT Time Calculation (min) 41 min    Activity Tolerance Patient tolerated treatment well    Behavior During Therapy [facility] for tasks assessed/performed             Past Medical History:  Diagnosis Date   Allergy    Anemia    Anxiety    Arthritis    Asthma    Depression    Hypertension     Past Surgical History:  Procedure Laterality Date   CHOLECYSTECTOMY  1993   COLONOSCOPY     TOTAL KNEE ARTHROPLASTY Right 01/28/2016   TOTAL KNEE ARTHROPLASTY Right 01/28/2016   Procedure: RIGHT TOTAL KNEE ARTHROPLASTY;  Surgeon: [name], MD;  Location: [place];  Service: Orthopedics;  Laterality: Right;   TRIGGER FINGER RELEASE Right 06/18/2014   Procedure: RIGHT LONG FINGER TRIGGER RELEASE;  Surgeon: [name], MD;  Location: [place];  Service: Orthopedics;  Laterality: Right;   TUBAL LIGATION     VAGINAL HYSTERECTOMY Bilateral 11/04/2016   Procedure: HYSTERECTOMY VAGINAL with Bilateral Salpingectomy;  Surgeon: [name], MD;  Location: [facility];  Service: Gynecology;  Laterality: Bilateral;   WEIL OSTEOTOMY Right 07/07/2020   Procedure: RIGHT FOOT WEIL OSTEOTOMY 2, 3, AND 4 METATARSALS AND PROXIMAL INTERPHALANGEAL JOINT FUSION 2 & 4 TOES;  Surgeon: Newt Minion, MD;  Location: [place];  Service: Orthopedics;  Laterality: Right;    There were no vitals filed for this visit.   Subjective Assessment - 05/18/21 0817      Subjective I felt much better after the soft tissue work last time.  No present pain today.    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 She as diagnosed in February 2022 with Multiple Myeloma not in remission. 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 no limits    How long can you stand comfortably? 79mn    How long can you walk comfortably? 536m    Diagnostic tests no scans scheduled recently    Patient Stated Goals get strong, build up the muscles    Currently in Pain? No/denies    Pain Score 0-No pain    Multiple Pain Sites No                               [facility] Adult PT Treatment/Exercise - 05/18/21 0001       Exercises   Other Exercises  Nust seat 9, UE 9, lev 5 x 7.5 min, scapular retraction seated with red x 15, LAQ 2# 2x10  PT Long Term Goals - 05/18/21 2355       PT LONG TERM GOAL #1   Title Pt will return to the gym    Baseline not yet, but improving and concerned about covid/flu    Time 8    Period Weeks    Status On-going    Target Date 07/13/21      PT LONG TERM GOAL #2   Title Pt will improve 51mn walk test to at least 10070fto demonstrate decreased fall risk    Baseline 643 baseline, 1061 today    Time 8    Period Weeks    Status Achieved    Target Date 05/18/21      PT LONG TERM GOAL #3   Title Pt will be able to perform a 5 x sts in 20 sec or less showing improved LE strength and    Baseline 18:40 today   Time 8    Period Weeks    Status Achieved    Target Date 05/18/21      PT LONG TERM GOAL #4   Title Pt will be able to go up and down stairs at home    Time 8    Period Weeks    Status Achieved      PT LONG TERM GOAL #5   Title Able to hold tandem stance and SLS for 10 seconds.    Baseline tandem today met with left  foot forward, not met right forward,  SLS only held 3-5 sec    Time 8    Period Weeks    Status On-going    Target Date 07/13/21      Additional Long Term Goals   Additional Long Term Goals Yes      PT LONG TERM GOAL #6   Title Able to walk 1250 ft in 6 min to demonstrate improved gait speed    Time 8    Period Weeks    Status New    Target Date 07/13/21                   Plan - 05/18/21 0857     Clinical Impression Statement Pt was reassed today and she did very well meeting several of her LTGs including being able to walk 1000 feet in 6 min, and perform 5 sit to stands in 20 seconds.  She continues to have difficulty with balance . She was able to maintain tandem stance for 10 sec with left foot forward, but was unable to maintain SLS for more than 3-5 seconds given multiple attempts today, however, this was done at the end and pt was more fatigued.    Personal Factors and Comorbidities Comorbidity 3+    Comorbidities Multiple Myeloma, multi joint OA, recent foot surgery, fall risk,SCT    Examination-Activity Limitations Sleep;Squat;Stand;Stairs;Transfers;Locomotion Level;Sit    Examination-Participation Restrictions Cleaning;Community Activity;Occupation;Shop    Stability/Clinical Decision Making Stable/Uncomplicated    Rehab Potential Good    PT Frequency 2x / week    PT Duration 8 weeks    PT Treatment/Interventions ADLs/Self Care Home Management;Aquatic Therapy;Gait training;Stair training;Functional mobility training;Therapeutic activities;Therapeutic exercise;Balance training;Neuromuscular re-education;Manual techniques;Patient/family education;Passive range of motion;Joint Manipulations    PT Next Visit Plan STM left LB and gluts, core strength/stabs, endurance    PT Home Exercise Plan mini squats, sidestepping, step ups,stretches,ppt,    Recommended Other Services continue aquatics    Consulted and Agree with Plan of Care Patient  Patient will  benefit from skilled therapeutic intervention in order to improve the following deficits and impairments:  Abnormal gait, Decreased mobility, Decreased activity tolerance, Decreased endurance, Decreased strength, Decreased balance, Decreased knowledge of precautions, Difficulty walking, Postural dysfunction, Pain  Visit Diagnosis: Muscle weakness (generalized)  Difficulty in walking, not elsewhere classified  Multiple myeloma not having achieved remission (HCC)  Fatigue, unspecified type     Problem List Patient Active Problem List   Diagnosis Date Noted   Multiple myeloma ([place]) 07/13/2020   Claw toe, right    Trigger finger, left middle finger 04/09/2020   Stenosing tenosynovitis of finger of left hand 02/27/2020   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   Major depression, recurrent ([place]) 01/12/2018   Chronic right shoulder pain 12/20/2017   MDD (major depressive disorder), recurrent episode, moderate ([place]) 11/04/2017   MGUS (monoclonal gammopathy of unknown significance) 10/24/2017   Primary osteoarthritis of both hands 09/26/2017   Primary osteoarthritis of right shoulder 09/26/2017   Primary osteoarthritis of both hips 09/26/2017   Status post total knee replacement, right 09/26/2017   Primary osteoarthritis of both feet 09/26/2017   History of asthma 09/26/2017   Primary osteoarthritis of left knee 04/18/2017   Menorrhagia 11/04/2016   Fibroid tumor 11/04/2016   Adenomyosis 11/04/2016   Hypertension, essential 11/04/2016   Total knee replacement status 01/28/2016    Hannah [name], PT 05/18/2021, 9:02 AM  [facility] @ [place], [place], [address] Phone: [phone]   Fax:  [phone]  Name: Hannah [name] MRN: [id number] Date of Birth: [birth year]

## 2021-05-19 ENCOUNTER — Encounter: Payer: Self-pay | Admitting: Physical Therapy

## 2021-05-19 ENCOUNTER — Ambulatory Visit: Payer: BC Managed Care – PPO | Admitting: Physical Therapy

## 2021-05-19 DIAGNOSIS — M1990 Unspecified osteoarthritis, unspecified site: Secondary | ICD-10-CM | POA: Diagnosis not present

## 2021-05-19 DIAGNOSIS — M6281 Muscle weakness (generalized): Secondary | ICD-10-CM

## 2021-05-19 DIAGNOSIS — F32A Depression, unspecified: Secondary | ICD-10-CM | POA: Diagnosis not present

## 2021-05-19 DIAGNOSIS — C9000 Multiple myeloma not having achieved remission: Secondary | ICD-10-CM | POA: Diagnosis not present

## 2021-05-19 DIAGNOSIS — Z923 Personal history of irradiation: Secondary | ICD-10-CM | POA: Diagnosis not present

## 2021-05-19 DIAGNOSIS — Z9079 Acquired absence of other genital organ(s): Secondary | ICD-10-CM | POA: Diagnosis not present

## 2021-05-19 DIAGNOSIS — M48061 Spinal stenosis, lumbar region without neurogenic claudication: Secondary | ICD-10-CM | POA: Diagnosis not present

## 2021-05-19 DIAGNOSIS — E669 Obesity, unspecified: Secondary | ICD-10-CM | POA: Diagnosis not present

## 2021-05-19 DIAGNOSIS — R293 Abnormal posture: Secondary | ICD-10-CM | POA: Diagnosis not present

## 2021-05-19 DIAGNOSIS — Z79899 Other long term (current) drug therapy: Secondary | ICD-10-CM | POA: Diagnosis not present

## 2021-05-19 DIAGNOSIS — G629 Polyneuropathy, unspecified: Secondary | ICD-10-CM | POA: Diagnosis not present

## 2021-05-19 DIAGNOSIS — Z7961 Long term (current) use of immunomodulator: Secondary | ICD-10-CM | POA: Diagnosis not present

## 2021-05-19 DIAGNOSIS — R5383 Other fatigue: Secondary | ICD-10-CM

## 2021-05-19 DIAGNOSIS — Z9049 Acquired absence of other specified parts of digestive tract: Secondary | ICD-10-CM | POA: Diagnosis not present

## 2021-05-19 DIAGNOSIS — Z882 Allergy status to sulfonamides status: Secondary | ICD-10-CM | POA: Diagnosis not present

## 2021-05-19 DIAGNOSIS — F419 Anxiety disorder, unspecified: Secondary | ICD-10-CM | POA: Diagnosis not present

## 2021-05-19 DIAGNOSIS — R262 Difficulty in walking, not elsewhere classified: Secondary | ICD-10-CM

## 2021-05-19 DIAGNOSIS — D509 Iron deficiency anemia, unspecified: Secondary | ICD-10-CM | POA: Diagnosis not present

## 2021-05-19 DIAGNOSIS — I10 Essential (primary) hypertension: Secondary | ICD-10-CM | POA: Diagnosis not present

## 2021-05-19 NOTE — Therapy (Signed)
[facility] @ [place] [facility] [place], [place], [address] Phone: [phone]   Fax:  [phone]  Physical Therapy Treatment  Patient Details  Name: Hannah [name] MRN: [id number] Date of Birth: Oct 09, 1960 Referring Provider (PT): Cira Rue   Encounter Date: 05/19/2021   PT End of Session - 05/19/21 0940     Visit Number 35    Number of Visits 50    Date for PT Re-Evaluation 07/13/21    PT Start Time 0939    PT Stop Time 1037    PT Time Calculation (min) 58 min    Activity Tolerance Patient tolerated treatment well    Behavior During Therapy [facility] for tasks assessed/performed             Past Medical History:  Diagnosis Date   Allergy    Anemia    Anxiety    Arthritis    Asthma    Depression    Hypertension     Past Surgical History:  Procedure Laterality Date   CHOLECYSTECTOMY  1993   COLONOSCOPY     TOTAL KNEE ARTHROPLASTY Right 01/28/2016   TOTAL KNEE ARTHROPLASTY Right 01/28/2016   Procedure: RIGHT TOTAL KNEE ARTHROPLASTY;  Surgeon: [name], MD;  Location: [place];  Service: Orthopedics;  Laterality: Right;   TRIGGER FINGER RELEASE Right 06/18/2014   Procedure: RIGHT LONG FINGER TRIGGER RELEASE;  Surgeon: [name], MD;  Location: [place];  Service: Orthopedics;  Laterality: Right;   TUBAL LIGATION     VAGINAL HYSTERECTOMY Bilateral 11/04/2016   Procedure: HYSTERECTOMY VAGINAL with Bilateral Salpingectomy;  Surgeon: [name], MD;  Location: [facility];  Service: Gynecology;  Laterality: Bilateral;   WEIL OSTEOTOMY Right 07/07/2020   Procedure: RIGHT FOOT WEIL OSTEOTOMY 2, 3, AND 4 METATARSALS AND PROXIMAL INTERPHALANGEAL JOINT FUSION 2 & 4 TOES;  Surgeon: Newt Minion, MD;  Location: [place];  Service: Orthopedics;  Laterality: Right;    There were no vitals filed for this visit.   Subjective Assessment - 05/19/21 1039     Subjective Doing well, no  complaints. I feel like I am progressing forward.    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 She as diagnosed in February 2022 with Multiple Myeloma not in remission. 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  Treatment took place in water 2.5-4 feet deep depending upon activity.  Pt entered the pool via steps with moderate use of rails. Water temp 93 degrees F. Pt requires buoyancy of water for support and to offload joints with strengthening exercises.    Seated water bench with 75% submersion Pt performed seated LE AROM exercises 20x in all planes, concurrent discussion of current status.  75% submersion: Water walking with large noodle UE push/pull 10x forward and back, hand paddles for side steeping with squat 10x. High knee marching with yellow noodle for postural support 6x: some fatigue noted. Abdominal compressions with neck pillow 5 sec 12x, blue triple buoy UE weights for horizontal abd/add with VC for core activation 2x15, seated decompression rest 3 min f/b underwater bicycle 4 min; very fatigued today in LE: rest 2 min, then 3 min on second attempt.  PT Long Term Goals - 05/18/21 3154       PT LONG TERM GOAL #1   Title Pt will return to the gym    Baseline not yet, but improving and concerned about covid/fluid    Time 8    Period Weeks    Status On-going    Target Date 07/13/21      PT LONG TERM GOAL #2   Title Pt will improve 50min walk test to at least 1045ft to demonstrate decreased fall risk    Baseline 643, 1061 today    Time 8    Period Weeks    Status Achieved    Target Date 05/18/21      PT LONG TERM GOAL #3   Title Pt will be able to perform a 5 x sts in 20 sec or less showing improved LE strength and    Baseline 18:40    Time 8     Period Weeks    Status Achieved    Target Date 05/18/21      PT LONG TERM GOAL #4   Title Pt will be able to go up and down stairs at home    Time 8    Period Weeks    Status Achieved      PT LONG TERM GOAL #5   Title Able to hold tandem stance and SLS for 10 seconds.    Baseline tandem today met with left foot forward, not met right forward,  SLS only held 3-5 sec    Time 8    Period Weeks    Status On-going    Target Date 07/13/21      Additional Long Term Goals   Additional Long Term Goals Yes      PT LONG TERM GOAL #6   Title Able to walk 1250 ft in 6 min to demonstrate improved gait speed    Time 8    Period Weeks    Status New    Target Date 07/13/21                   Plan - 05/19/21 1039     Clinical Impression Statement Pt arrives a little fatigue from driving her husband to many early AM doctors appts this week. Pt could only perform her underwater bicycle barely 4 min then only 3 min on second set today. Overall slower movements today to conserve energy and not wear herself out.    Personal Factors and Comorbidities Comorbidity 3+    Comorbidities Multiple Myeloma, multi joint OA, recent foot surgery, fall risk,SCT    Examination-Activity Limitations Sleep;Squat;Stand;Stairs;Transfers;Locomotion Level;Sit    Examination-Participation Restrictions Cleaning;Community Activity;Occupation;Shop    Stability/Clinical Decision Making Stable/Uncomplicated    Rehab Potential Good    PT Frequency 2x / week    PT Duration 8 weeks    PT Treatment/Interventions ADLs/Self Care Home Management;Aquatic Therapy;Gait training;Stair training;Functional mobility training;Therapeutic activities;Therapeutic exercise;Balance training;Neuromuscular re-education;Manual techniques;Patient/family education;Passive range of motion;Joint Manipulations    PT Next Visit Plan STM left LB and gluts, core strength/stabs, endurance    Consulted and Agree with Plan of Care Patient              Patient will benefit from skilled therapeutic intervention in order to improve the following deficits and impairments:  Abnormal gait, Decreased mobility, Decreased activity tolerance, Decreased endurance, Decreased strength, Decreased balance, Decreased knowledge of precautions, Difficulty walking, Postural dysfunction, Pain  Visit Diagnosis: Muscle weakness (generalized)  Difficulty in walking, not elsewhere classified  Multiple myeloma not having achieved remission (HCC)  Fatigue, unspecified type     Problem List Patient Active Problem List   Diagnosis Date Noted   Multiple myeloma ([place]) 07/13/2020   Claw toe, right    Trigger finger, left middle finger 04/09/2020   Stenosing tenosynovitis of finger of left hand 02/27/2020   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   Major depression, recurrent ([place]) 01/12/2018   Chronic right shoulder pain 12/20/2017   MDD (major depressive disorder), recurrent episode, moderate ([place]) 11/04/2017   MGUS (monoclonal gammopathy of unknown significance) 10/24/2017   Primary osteoarthritis of both hands 09/26/2017   Primary osteoarthritis of right shoulder 09/26/2017   Primary osteoarthritis of both hips 09/26/2017   Status post total knee replacement, right 09/26/2017   Primary osteoarthritis of both feet 09/26/2017   History of asthma 09/26/2017   Primary osteoarthritis of left knee 04/18/2017   Menorrhagia 11/04/2016   Fibroid tumor 11/04/2016   Adenomyosis 11/04/2016   Hypertension, essential 11/04/2016   Total knee replacement status 01/28/2016    Rahaf Carbonell, PTA 05/19/2021, 10:43 AM  [facility] @ [place], [place], [address] Phone: [phone]   Fax:  [phone]  Name: CAMMIE FAULSTICH MRN: [id number] Date of Birth: 1961-03-11

## 2021-05-20 ENCOUNTER — Other Ambulatory Visit: Payer: Self-pay | Admitting: Nurse Practitioner

## 2021-05-20 ENCOUNTER — Other Ambulatory Visit: Payer: Self-pay

## 2021-05-20 MED ORDER — REVLIMID 10 MG PO CAPS: 10.0000 mg | ORAL_CAPSULE | Freq: Every day | ORAL | 0 refills | Status: DC

## 2021-05-25 ENCOUNTER — Ambulatory Visit: Payer: BC Managed Care – PPO

## 2021-05-25 ENCOUNTER — Other Ambulatory Visit: Payer: Self-pay

## 2021-05-25 DIAGNOSIS — Z7961 Long term (current) use of immunomodulator: Secondary | ICD-10-CM | POA: Diagnosis not present

## 2021-05-25 DIAGNOSIS — M1990 Unspecified osteoarthritis, unspecified site: Secondary | ICD-10-CM | POA: Diagnosis not present

## 2021-05-25 DIAGNOSIS — R262 Difficulty in walking, not elsewhere classified: Secondary | ICD-10-CM

## 2021-05-25 DIAGNOSIS — C9000 Multiple myeloma not having achieved remission: Secondary | ICD-10-CM

## 2021-05-25 DIAGNOSIS — F32A Depression, unspecified: Secondary | ICD-10-CM | POA: Diagnosis not present

## 2021-05-25 DIAGNOSIS — Z882 Allergy status to sulfonamides status: Secondary | ICD-10-CM | POA: Diagnosis not present

## 2021-05-25 DIAGNOSIS — E669 Obesity, unspecified: Secondary | ICD-10-CM | POA: Diagnosis not present

## 2021-05-25 DIAGNOSIS — M48061 Spinal stenosis, lumbar region without neurogenic claudication: Secondary | ICD-10-CM | POA: Diagnosis not present

## 2021-05-25 DIAGNOSIS — Z79899 Other long term (current) drug therapy: Secondary | ICD-10-CM | POA: Diagnosis not present

## 2021-05-25 DIAGNOSIS — I10 Essential (primary) hypertension: Secondary | ICD-10-CM | POA: Diagnosis not present

## 2021-05-25 DIAGNOSIS — Z923 Personal history of irradiation: Secondary | ICD-10-CM | POA: Diagnosis not present

## 2021-05-25 DIAGNOSIS — R5383 Other fatigue: Secondary | ICD-10-CM

## 2021-05-25 DIAGNOSIS — Z9079 Acquired absence of other genital organ(s): Secondary | ICD-10-CM | POA: Diagnosis not present

## 2021-05-25 DIAGNOSIS — G629 Polyneuropathy, unspecified: Secondary | ICD-10-CM | POA: Diagnosis not present

## 2021-05-25 DIAGNOSIS — F419 Anxiety disorder, unspecified: Secondary | ICD-10-CM | POA: Diagnosis not present

## 2021-05-25 DIAGNOSIS — Z9049 Acquired absence of other specified parts of digestive tract: Secondary | ICD-10-CM | POA: Diagnosis not present

## 2021-05-25 DIAGNOSIS — M6281 Muscle weakness (generalized): Secondary | ICD-10-CM

## 2021-05-25 DIAGNOSIS — R293 Abnormal posture: Secondary | ICD-10-CM | POA: Diagnosis not present

## 2021-05-25 DIAGNOSIS — D509 Iron deficiency anemia, unspecified: Secondary | ICD-10-CM | POA: Diagnosis not present

## 2021-05-25 NOTE — Therapy (Signed)
[employer] @ [place], [place], [address] Phone: [phone]   Fax:  [phone]  Physical Therapy Treatment  Patient Details  Name: Hannah [name] MRN: [id number] Date of Birth: [birth year] Referring Provider (PT): Cira Rue   Encounter Date: 05/25/2021   PT End of Session - 05/25/21 0817     Visit Number 36    Number of Visits 50    Date for PT Re-Evaluation 07/13/21    PT Start Time 0812    PT Stop Time 0855    PT Time Calculation (min) 43 min    Activity Tolerance Patient tolerated treatment well    Behavior During Therapy [facility] for tasks assessed/performed             Past Medical History:  Diagnosis Date   Allergy    Anemia    Anxiety    Arthritis    Asthma    Depression    Hypertension     Past Surgical History:  Procedure Laterality Date   CHOLECYSTECTOMY  1993   COLONOSCOPY     TOTAL KNEE ARTHROPLASTY Right 01/28/2016   TOTAL KNEE ARTHROPLASTY Right 01/28/2016   Procedure: RIGHT TOTAL KNEE ARTHROPLASTY;  Surgeon: [name], MD;  Location: [place];  Service: Orthopedics;  Laterality: Right;   TRIGGER FINGER RELEASE Right 06/18/2014   Procedure: RIGHT LONG FINGER TRIGGER RELEASE;  Surgeon: [name], MD;  Location: [place];  Service: Orthopedics;  Laterality: Right;   TUBAL LIGATION     VAGINAL HYSTERECTOMY Bilateral 11/04/2016   Procedure: HYSTERECTOMY VAGINAL with Bilateral Salpingectomy;  Surgeon: [name], MD;  Location: [facility];  Service: Gynecology;  Laterality: Bilateral;   WEIL OSTEOTOMY Right 07/07/2020   Procedure: RIGHT FOOT WEIL OSTEOTOMY 2, 3, AND 4 METATARSALS AND PROXIMAL INTERPHALANGEAL JOINT FUSION 2 & 4 TOES;  Surgeon: Newt Minion, MD;  Location: [place];  Service: Orthopedics;  Laterality: Right;    There were no vitals filed for this visit.   Subjective Assessment - 05/25/21 0814     Subjective Pool went well  last time and I'm a little tired today, but I rested all day yesterday.they fixed the hot tub too. Shoulder is achy.    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 She as diagnosed in February 2022 with Multiple Myeloma not in remission. 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 no limits    How long can you stand comfortably? 77mn    How long can you walk comfortably? 538m    Diagnostic tests no scans scheduled recently    Patient Stated Goals get strong, build up the muscles    Currently in Pain? Yes    Pain Score 4     Pain Location Back    Pain Descriptors / Indicators Aching    Pain Type Chronic pain    Pain Onset More than a month ago    Pain Frequency Intermittent    Multiple Pain Sites Yes    Pain Location Shoulder    Pain Orientation Right    Pain Descriptors / Indicators Aching    Pain Type Chronic pain    Pain Onset More than a month ago    Pain Frequency Constant  [facility] Adult PT Treatment/Exercise - 05/25/21 0001       Exercises   Other Exercises  Nu step seat 9, UE 9 lev 5 x 7 min, tandem stance x 4 ea side, SLS x 3 ea with emphasis on focus and core. airex beam forwards and sideways x 4 ea, scapular retraction yellow x 15, LAQ 3 # 2 x10, ppt x 10, ppt with red clam 3 x 5, ppt with SLR x 5 bilateral, piriformis stretch x 2 bilaterally.                          PT Long Term Goals - 05/18/21 0175       PT LONG TERM GOAL #1   Title Pt will return to the gym    Baseline not yet, but improving and concerned about covid/fluid    Time 8    Period Weeks    Status On-going    Target Date 07/13/21      PT LONG TERM GOAL #2   Title Pt will improve 81mn walk test to at least 10034fto demonstrate decreased fall risk    Baseline 643, 1061 today    Time 8    Period  Weeks    Status Achieved    Target Date 05/18/21      PT LONG TERM GOAL #3   Title Pt will be able to perform a 5 x sts in 20 sec or less showing improved LE strength and    Baseline 18:40    Time 8    Period Weeks    Status Achieved    Target Date 05/18/21      PT LONG TERM GOAL #4   Title Pt will be able to go up and down stairs at home    Time 8    Period Weeks    Status Achieved      PT LONG TERM GOAL #5   Title Able to hold tandem stance and SLS for 10 seconds.    Baseline tandem today met with left foot forward, not met right forward,  SLS only held 3-5 sec    Time 8    Period Weeks    Status On-going    Target Date 07/13/21      Additional Long Term Goals   Additional Long Term Goals Yes      PT LONG TERM GOAL #6   Title Able to walk 1250 ft in 6 min to demonstrate improved gait speed    Time 8    Period Weeks    Status New    Target Date 07/13/21                   Plan - 05/25/21 0818     Clinical Impression Statement pt did much better with balance today with good focus and core stabilization.  She was able to maintain tandem and SLS for a count of 10 or more several times after she really focused and set herself well in the beginning. bilateral piriformis still very tight left greater than right.    Personal Factors and Comorbidities Comorbidity 3+    Comorbidities Multiple Myeloma, multi joint OA, recent foot surgery, fall risk,SCT    Examination-Activity Limitations Sleep;Squat;Stand;Stairs;Transfers;Locomotion Level;Sit    Examination-Participation Restrictions Cleaning;Community Activity;Occupation;Shop    Stability/Clinical Decision Making Stable/Uncomplicated    Rehab Potential Good    PT Frequency 2x / week    PT Duration 8 weeks  PT Treatment/Interventions ADLs/Self Care Home Management;Aquatic Therapy;Gait training;Stair training;Functional mobility training;Therapeutic activities;Therapeutic exercise;Balance training;Neuromuscular  re-education;Manual techniques;Patient/family education;Passive range of motion;Joint Manipulations    PT Next Visit Plan STM left LB and gluts, core strength/stabs, endurance    PT Home Exercise Plan mini squats, sidestepping, step ups,stretches,ppt,    Consulted and Agree with Plan of Care Patient             Patient will benefit from skilled therapeutic intervention in order to improve the following deficits and impairments:  Abnormal gait, Decreased mobility, Decreased activity tolerance, Decreased endurance, Decreased strength, Decreased balance, Decreased knowledge of precautions, Difficulty walking, Postural dysfunction, Pain  Visit Diagnosis: Muscle weakness (generalized)  Difficulty in walking, not elsewhere classified  Multiple myeloma not having achieved remission (HCC)  Fatigue, unspecified type     Problem List Patient Active Problem List   Diagnosis Date Noted   Multiple myeloma ([place]) 07/13/2020   Claw toe, right    Trigger finger, left middle finger 04/09/2020   Stenosing tenosynovitis of finger of left hand 02/27/2020   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   Major depression, recurrent ([place]) 01/12/2018   Chronic right shoulder pain 12/20/2017   MDD (major depressive disorder), recurrent episode, moderate ([place]) 11/04/2017   MGUS (monoclonal gammopathy of unknown significance) 10/24/2017   Primary osteoarthritis of both hands 09/26/2017   Primary osteoarthritis of right shoulder 09/26/2017   Primary osteoarthritis of both hips 09/26/2017   Status post total knee replacement, right 09/26/2017   Primary osteoarthritis of both feet 09/26/2017   History of asthma 09/26/2017   Primary osteoarthritis of left knee 04/18/2017   Menorrhagia 11/04/2016   Fibroid tumor 11/04/2016   Adenomyosis 11/04/2016   Hypertension, essential 11/04/2016   Total knee replacement status 01/28/2016    Claris Pong, PT 05/25/2021, 8:55 AM  [facility] @ [place], [place], [address] Phone: [phone]   Fax:  [phone]  Name: Hannah [name] MRN: [id number] Date of Birth: [birth year]

## 2021-05-28 ENCOUNTER — Inpatient Hospital Stay (HOSPITAL_BASED_OUTPATIENT_CLINIC_OR_DEPARTMENT_OTHER): Payer: BC Managed Care – PPO | Admitting: Hematology

## 2021-05-28 ENCOUNTER — Inpatient Hospital Stay: Payer: BC Managed Care – PPO

## 2021-05-28 ENCOUNTER — Other Ambulatory Visit: Payer: Self-pay

## 2021-05-28 ENCOUNTER — Inpatient Hospital Stay: Payer: BC Managed Care – PPO | Attending: Hematology

## 2021-05-28 ENCOUNTER — Ambulatory Visit: Payer: BC Managed Care – PPO | Admitting: Physical Therapy

## 2021-05-28 ENCOUNTER — Encounter: Payer: Self-pay | Admitting: Hematology

## 2021-05-28 VITALS — BP 111/72 | HR 108 | Temp 98.5°F | Resp 18 | Ht 66.0 in | Wt 190.6 lb

## 2021-05-28 DIAGNOSIS — M25651 Stiffness of right hip, not elsewhere classified: Secondary | ICD-10-CM | POA: Insufficient documentation

## 2021-05-28 DIAGNOSIS — Z882 Allergy status to sulfonamides status: Secondary | ICD-10-CM | POA: Insufficient documentation

## 2021-05-28 DIAGNOSIS — E669 Obesity, unspecified: Secondary | ICD-10-CM | POA: Insufficient documentation

## 2021-05-28 DIAGNOSIS — Z7961 Long term (current) use of immunomodulator: Secondary | ICD-10-CM | POA: Diagnosis not present

## 2021-05-28 DIAGNOSIS — M25652 Stiffness of left hip, not elsewhere classified: Secondary | ICD-10-CM | POA: Insufficient documentation

## 2021-05-28 DIAGNOSIS — M48061 Spinal stenosis, lumbar region without neurogenic claudication: Secondary | ICD-10-CM | POA: Diagnosis not present

## 2021-05-28 DIAGNOSIS — Z9079 Acquired absence of other genital organ(s): Secondary | ICD-10-CM | POA: Diagnosis not present

## 2021-05-28 DIAGNOSIS — G629 Polyneuropathy, unspecified: Secondary | ICD-10-CM | POA: Insufficient documentation

## 2021-05-28 DIAGNOSIS — M1990 Unspecified osteoarthritis, unspecified site: Secondary | ICD-10-CM | POA: Diagnosis not present

## 2021-05-28 DIAGNOSIS — R5383 Other fatigue: Secondary | ICD-10-CM | POA: Insufficient documentation

## 2021-05-28 DIAGNOSIS — F32A Depression, unspecified: Secondary | ICD-10-CM | POA: Diagnosis not present

## 2021-05-28 DIAGNOSIS — R2681 Unsteadiness on feet: Secondary | ICD-10-CM | POA: Insufficient documentation

## 2021-05-28 DIAGNOSIS — Z79899 Other long term (current) drug therapy: Secondary | ICD-10-CM | POA: Insufficient documentation

## 2021-05-28 DIAGNOSIS — R293 Abnormal posture: Secondary | ICD-10-CM | POA: Insufficient documentation

## 2021-05-28 DIAGNOSIS — Z9049 Acquired absence of other specified parts of digestive tract: Secondary | ICD-10-CM | POA: Diagnosis not present

## 2021-05-28 DIAGNOSIS — G8929 Other chronic pain: Secondary | ICD-10-CM | POA: Insufficient documentation

## 2021-05-28 DIAGNOSIS — C9000 Multiple myeloma not having achieved remission: Secondary | ICD-10-CM | POA: Diagnosis not present

## 2021-05-28 DIAGNOSIS — F419 Anxiety disorder, unspecified: Secondary | ICD-10-CM | POA: Diagnosis not present

## 2021-05-28 DIAGNOSIS — Z923 Personal history of irradiation: Secondary | ICD-10-CM | POA: Diagnosis not present

## 2021-05-28 DIAGNOSIS — M6281 Muscle weakness (generalized): Secondary | ICD-10-CM

## 2021-05-28 DIAGNOSIS — R2689 Other abnormalities of gait and mobility: Secondary | ICD-10-CM | POA: Insufficient documentation

## 2021-05-28 DIAGNOSIS — D509 Iron deficiency anemia, unspecified: Secondary | ICD-10-CM | POA: Insufficient documentation

## 2021-05-28 DIAGNOSIS — I10 Essential (primary) hypertension: Secondary | ICD-10-CM | POA: Insufficient documentation

## 2021-05-28 DIAGNOSIS — Z9484 Stem cells transplant status: Secondary | ICD-10-CM | POA: Insufficient documentation

## 2021-05-28 DIAGNOSIS — R262 Difficulty in walking, not elsewhere classified: Secondary | ICD-10-CM

## 2021-05-28 DIAGNOSIS — M5442 Lumbago with sciatica, left side: Secondary | ICD-10-CM | POA: Insufficient documentation

## 2021-05-28 LAB — CMP (CANCER CENTER ONLY)
ALT: 11 U/L (ref 0–44)
AST: 15 U/L (ref 15–41)
Albumin: 4.2 g/dL (ref 3.5–5.0)
Alkaline Phosphatase: 62 U/L (ref 38–126)
Anion gap: 5 (ref 5–15)
BUN: 19 mg/dL (ref 6–20)
CO2: 31 mmol/L (ref 22–32)
Calcium: 9.1 mg/dL (ref 8.9–10.3)
Chloride: 105 mmol/L (ref 98–111)
Creatinine: 0.9 mg/dL (ref 0.44–1.00)
GFR, Estimated: >60 mL/min (ref >60)
Glucose, Bld: 80 mg/dL (ref 70–99)
Potassium: 3.6 mmol/L (ref 3.5–5.1)
Sodium: 141 mmol/L (ref 135–145)
Total Bilirubin: 0.7 mg/dL (ref 0.3–1.2)
Total Protein: 7.1 g/dL (ref 6.5–8.1)

## 2021-05-28 LAB — CBC WITH DIFFERENTIAL (CANCER CENTER ONLY)
Abs Immature Granulocytes: 0.02 10*3/uL (ref 0.00–0.07)
Basophils Absolute: 0 10*3/uL (ref 0.0–0.1)
Basophils Relative: 0 %
Eosinophils Absolute: 0.1 10*3/uL (ref 0.0–0.5)
Eosinophils Relative: 2 %
HCT: 30.5 % — ABNORMAL LOW (ref 36.0–46.0)
Hemoglobin: 9.8 g/dL — ABNORMAL LOW (ref 12.0–15.0)
Immature Granulocytes: 0 %
Lymphocytes Relative: 32 %
Lymphs Abs: 1.9 10*3/uL (ref 0.7–4.0)
MCH: 25.1 pg — ABNORMAL LOW (ref 26.0–34.0)
MCHC: 32.1 g/dL (ref 30.0–36.0)
MCV: 78 fL — ABNORMAL LOW (ref 80.0–100.0)
Monocytes Absolute: 0.5 10*3/uL (ref 0.1–1.0)
Monocytes Relative: 9 %
Neutro Abs: 3.4 10*3/uL (ref 1.7–7.7)
Neutrophils Relative %: 57 %
Platelet Count: 167 10*3/uL (ref 150–400)
RBC: 3.91 MIL/uL (ref 3.87–5.11)
RDW: 15.7 % — ABNORMAL HIGH (ref 11.5–15.5)
WBC Count: 6 10*3/uL (ref 4.0–10.5)
nRBC: 0 % (ref 0.0–0.2)

## 2021-05-28 MED ORDER — ZOLEDRONIC ACID 4 MG/100ML IV SOLN
4.0000 mg | Freq: Once | INTRAVENOUS | Status: AC
  Administered 2021-05-28: 16:00:00 4 mg via INTRAVENOUS
  Filled 2021-05-28: qty 100

## 2021-05-28 MED ORDER — SODIUM CHLORIDE 0.9 % IV SOLN
Freq: Once | INTRAVENOUS | Status: AC
Start: 2021-05-28 — End: 2021-05-28

## 2021-05-28 NOTE — Therapy (Signed)
[place] @ [facility] [place], [place], [address] Phone: [phone]   Fax:  [phone]  Physical Therapy Treatment  Patient Details  Name: Hannah [name] MRN: [id number] Date of Birth: Apr 19, 1961 Referring Provider (PT): Cira Rue   Encounter Date: 05/28/2021   PT End of Session - 05/28/21 1227     Visit Number 37    Number of Visits 50    Date for PT Re-Evaluation 07/13/21    PT Start Time 14   Pt late   PT Stop Time 1305    PT Time Calculation (min) 39 min    Activity Tolerance Patient tolerated treatment well    Behavior During Therapy [facility] for tasks assessed/performed             Past Medical History:  Diagnosis Date   Allergy    Anemia    Anxiety    Arthritis    Asthma    Depression    Hypertension     Past Surgical History:  Procedure Laterality Date   CHOLECYSTECTOMY  1993   COLONOSCOPY     TOTAL KNEE ARTHROPLASTY Right 01/28/2016   TOTAL KNEE ARTHROPLASTY Right 01/28/2016   Procedure: RIGHT TOTAL KNEE ARTHROPLASTY;  Surgeon: [name], MD;  Location: [facility];  Service: Orthopedics;  Laterality: Right;   TRIGGER FINGER RELEASE Right 06/18/2014   Procedure: RIGHT LONG FINGER TRIGGER RELEASE;  Surgeon: [name], MD;  Location: [place];  Service: Orthopedics;  Laterality: Right;   TUBAL LIGATION     VAGINAL HYSTERECTOMY Bilateral 11/04/2016   Procedure: HYSTERECTOMY VAGINAL with Bilateral Salpingectomy;  Surgeon: [name], MD;  Location: [facility];  Service: Gynecology;  Laterality: Bilateral;   WEIL OSTEOTOMY Right 07/07/2020   Procedure: RIGHT FOOT WEIL OSTEOTOMY 2, 3, AND 4 METATARSALS AND PROXIMAL INTERPHALANGEAL JOINT FUSION 2 & 4 TOES;  Surgeon: Newt Minion, MD;  Location: [facility];  Service: Orthopedics;  Laterality: Right;    There were no vitals filed for this visit.   Subjective Assessment - 05/28/21 1228     Subjective I'm  running today, a lot of appts. No new complaints.    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 She as diagnosed in February 2022 with Multiple Myeloma not in remission. 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 No/denies                 Treatment: Patient seen for aquatic therapy today.  Treatment took place in water 2.5-4 feet deep depending upon activity.  Pt entered the pool via stairs with mild use of hand rails. Pt requires buoyancy of water for support and to offload joints with strengthening exercises.  Water temp 95 degrees F.   Standing: 75% submersion for the following: Water walking 5x in each direction, added hand paddles for side stepping with mini squat. High knee marching with noodle for UE support 6x. Underwater bicycle 2 min ( no fatigue today) 3 bouts with noodle behind pt, 1 min decompression float in between sets. Abdominal compression 5 sec hold 10x with neck pillow.                             PT Long Term Goals - 05/18/21 5993  PT LONG TERM GOAL #1   Title Pt will return to the gym    Baseline not yet, but improving and concerned about covid/fluid    Time 8    Period Weeks    Status On-going    Target Date 07/13/21      PT LONG TERM GOAL #2   Title Pt will improve 21mn walk test to at least 10026fto demonstrate decreased fall risk    Baseline 643, 1061 today    Time 8    Period Weeks    Status Achieved    Target Date 05/18/21      PT LONG TERM GOAL #3   Title Pt will be able to perform a 5 x sts in 20 sec or less showing improved LE strength and    Baseline 18:40    Time 8    Period Weeks    Status Achieved    Target Date 05/18/21      PT LONG TERM GOAL #4   Title Pt will be able to go up and down stairs at home    Time 8    Period Weeks    Status Achieved       PT LONG TERM GOAL #5   Title Able to hold tandem stance and SLS for 10 seconds.    Baseline tandem today met with left foot forward, not met right forward,  SLS only held 3-5 sec    Time 8    Period Weeks    Status On-going    Target Date 07/13/21      Additional Long Term Goals   Additional Long Term Goals Yes      PT LONG TERM GOAL #6   Title Able to walk 1250 ft in 6 min to demonstrate improved gait speed    Time 8    Period Weeks    Status New    Target Date 07/13/21                   Plan - 05/28/21 1227     Clinical Impression Statement Pt arrives 15 min late for her aquatic treatment today. Pt reports she has been doing a lot of running around lately. Pt was less fatigued today with her bicycle exercise. No reports of back or shoulder pain.    Personal Factors and Comorbidities Comorbidity 3+    Comorbidities Multiple Myeloma, multi joint OA, recent foot surgery, fall risk,SCT    Examination-Activity Limitations Sleep;Squat;Stand;Stairs;Transfers;Locomotion Level;Sit    Examination-Participation Restrictions Cleaning;Community Activity;Occupation;Shop    Stability/Clinical Decision Making Stable/Uncomplicated    Rehab Potential Good    PT Frequency 2x / week    PT Duration 8 weeks    PT Treatment/Interventions ADLs/Self Care Home Management;Aquatic Therapy;Gait training;Stair training;Functional mobility training;Therapeutic activities;Therapeutic exercise;Balance training;Neuromuscular re-education;Manual techniques;Patient/family education;Passive range of motion;Joint Manipulations    PT Next Visit Plan STM left LB and gluts, core strength/stabs, endurance    Consulted and Agree with Plan of Care Patient             Patient will benefit from skilled therapeutic intervention in order to improve the following deficits and impairments:  Abnormal gait, Decreased mobility, Decreased activity tolerance, Decreased endurance, Decreased strength, Decreased balance,  Decreased knowledge of precautions, Difficulty walking, Postural dysfunction, Pain  Visit Diagnosis: Muscle weakness (generalized)  Difficulty in walking, not elsewhere classified  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   Major depression, recurrent ([place]) 01/12/2018   Chronic right shoulder pain 12/20/2017   MDD (major depressive disorder), recurrent episode, moderate ([place]) 11/04/2017   MGUS (monoclonal gammopathy of unknown significance) 10/24/2017   Primary osteoarthritis of both hands 09/26/2017   Primary osteoarthritis of right shoulder 09/26/2017   Primary osteoarthritis of both hips 09/26/2017   Status post total knee replacement, right 09/26/2017   Primary osteoarthritis of both feet 09/26/2017   History of asthma 09/26/2017   Primary osteoarthritis of left knee 04/18/2017   Menorrhagia 11/04/2016   Fibroid tumor 11/04/2016   Adenomyosis 11/04/2016   Hypertension, essential 11/04/2016   Total knee replacement status 01/28/2016    Dharma Pare, PTA 05/28/2021, 3:53 PM  [place] @ [facility] [place], [place], [address] Phone: [phone]   Fax:  [phone]  Name: Hannah [name] MRN: [id number] Date of Birth: 04/12/1961

## 2021-05-28 NOTE — Progress Notes (Signed)
[facility]   [phone] [phone]   Clinic Follow up Note   Patient Care Team: Tisovec, Fransico Him, MD as PCP - General (Internal Medicine) Bo Merino, MD as Consulting Physician (Rheumatology) Truitt Merle, MD as Consulting Physician (Hematology)  Date of Service:  05/28/2021  CHIEF COMPLAINT: f/u of IgA kappa multiple myeloma  PREVIOUS AND CURRENT THERAPY:  Induction RVD 07/2020-10/2020 S/p high dose chemo with melphalan and auto SCT 12/23/20 Currently on maintenance revlimid  Restart zometa q3 months  ASSESSMENT & PLAN:  Hannah [name] is a 60 y.o. female with   1. 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   -She underwent staging bone marrow biopsy on 07/30/20 which showed slightly hypercellular marrow for age and 31% plasma cells.  Cytogenetics and FISH were unremarkable -Due to mobility issues and pain, she completed palliative RT to pelvis, scapula/left chest, T4-6 and T10-12 by Dr. Lisbeth Renshaw from 07/30/20 - 08/12/20 -she started VRD (weekly Velcade and dexamethasone, Revlimid 2 weeks on 1 week off) on 08/17/20, IgA and light chains normalized and M protein decreased to 0.3 (from 3.4 prior to induction), consistent with an excellent response to treatment. 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 -She underwent high-dose chemo with melphalan and autologous stem  cell transplant at [facility] 12/23/20 by Dr. Aris Lot.  White cells engrafted 01/03/21 and platelets 01/13/21, last platelet transfusion 01/02/21 -posttreatment PET 04/06/2021 showed NED  -posttreatment bone marrow biopsy 04/29/21 showed 10-20% hypocellular marrow with myeloid hypoplasia and 3% plasma cells -Continue prophylaxis with valacyclovir for 1 year and dapsone for 6 months posttransplant; revaccinate per [facility] protocol -she began maintenance Revlimid 10 mg daily days 1-21 q. 28 days on 05/03/21. She is tolerating well overall.   2. Low back pain, right hip pain -She initially attributes her pain to sciatica pain. -work up per ortho showed multiple T1 lesions of her spine and sacrum compatible with MM on her 07/03/20 MRI lumbar spine. -PET 07/24/20 showed large hypermetabolic lesions in the left and right pelvis consistent with MM/plasmacytoma and a faintly metabolic lesion in the left scapula which is not currently causing significant pain. -She completed palliative radiation to the pelvis, left scapula, T4-6, and T10-12 on 3/3-3/16/22 -She received 3 doses of monthly Zometa 3/7-11/02/20. We will resume today, 05/28/21 -Pain resolved after RT, she has not required pain medication recently -She has tailbone and right shoulder arthritic pain at baseline, stable   3. 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 -Hemoglobin electrophoresis showed increase in Hg C, consistent with Hg C trait.  She is aware her children are at risk of hemoglobin C trait, they have been encouraged to be tested. -Anemia worsened recently with MM -Continue multivitamin, oral iron, folic acid, vitamin C and D per transplant team   4. HTN, OA, Obesity  -On chlorthalidone for HTN -She will continue to f/u  with PCP, Chiropractor and Ortho.  -She lost significant amount of weight during transplant process but is gaining it back, appetite  recovered   5. Anxiety, depression -under care of [facility] provider, tolerating Zoloft -She has strong faith and good family/social support.  She feels very positive about her experience and wants to share with others    Plan: -restart Zometa today and continue every 3 months until 07/2022 -continue Revlimid 10 mg p.o. daily 3 weeks on/1 week off -Lab and follow-up in 8 weeks, she will f/u [facility] in a month    No problem-specific Assessment & Plan notes found for this encounter.   SUMMARY OF ONCOLOGIC HISTORY: Oncology History Overview Note  Cancer Staging Multiple myeloma ([place]) Staging form: Plasma Cell Myeloma and Plasma Cell Disorders, AJCC 8th Edition - Clinical stage from 07/20/2020: Beta-2-microglobulin (mg/L): 2.4, Albumin (g/dL): 3.2, ISS: Stage II, High-risk cytogenetics: Unknown, LDH: Normal - Signed by Truitt Merle, MD on 08/02/2020 Beta 2 microglobulin range (mg/L): Less than 3.5 Albumin range (g/dL): Less than 3.5 Cytogenetics: Unknown    Multiple myeloma ([place])  07/03/2020 Imaging   MRI Lumbar Spine  IMPRESSION: 1. Numerous T1 hypointense and STIR hyperintense lesions throughout the visualized spine and sacrum with multiple areas of extraosseous extension in the sacrum, detailed above and compatible with multiple myeloma given the clinical history. 2. An MRI of the pelvis with contrast could evaluate the full extent of the partially imaged sacral lesions, including left S1-S2 neural foraminal involvement and suspected involvement of the exited left L5 nerve. 3. Multilevel degenerative change without significant canal or foraminal stenosis in the lumbar spine.   07/13/2020 Initial Diagnosis   Multiple myeloma ([place])   07/20/2020 Cancer Staging   Staging form: Plasma Cell Myeloma and Plasma Cell Disorders, AJCC 8th Edition - Clinical stage from 07/20/2020: Beta-2-microglobulin (mg/L): 2.4, Albumin (g/dL): 3.2, ISS: Stage II, High-risk cytogenetics: Unknown, LDH: Normal - Signed by  Truitt Merle, MD on 08/31/2020 Beta 2 microglobulin range (mg/L): Less than 3.5 Albumin range (g/dL): Less than 3.5 Cytogenetics: No abnormalities Bone disease on imaging: Present    07/24/2020 PET scan   IMPRESSION: 1. Moderate to high hypermetabolic activity associated with the expansile soft tissue masses within the LEFT and RIGHT pelvic bones is consistent with active multiple myeloma / plasmacytoma. 2. Lytic lesion in the LEFT scapula with mild metabolic activity is indeterminate. 3. Metabolic activity associated with the RIGHT knee prosthetic and RIGHT distal foot fusion is favored post procedural inflammation.     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  The findings are most consistent with  persistent/recurrent/previously known plasma cell neoplasm.  For  completeness, immunohistochemical stain for CD138 and in situ  hybridization for kappa and lambda will be performed and the results  reported in an addendum.  Correlation with cytogenetic and FISH studies  is recommended.   08/03/2020 -  Chemotherapy   Zometa q4weeks starting 08/03/20    08/17/2020 - 11/29/2020 Chemotherapy   Velcade weekly, Revlimid, dex  76m weekly (VRd)  Starting 08/17/20. Last dose Velcade 11/23/20 and last dose revlimid on 11/29/20       INTERVAL HISTORY:  Hannah SIERSis here for a follow up of MM. She was last seen by NP Lacie on 04/27/21. She presents to the clinic  alone. She reports she is recovering well from her transplant. She notes she underwent aqua therapy earlier today, which is tiring.   All other systems were reviewed with the patient and are negative.  MEDICAL HISTORY:  Past Medical History:  Diagnosis Date   Allergy    Anemia    Anxiety    Arthritis    Asthma    Depression    Hypertension     SURGICAL HISTORY: Past Surgical History:  Procedure Laterality Date   CHOLECYSTECTOMY  1993   COLONOSCOPY     TOTAL KNEE ARTHROPLASTY Right 01/28/2016   TOTAL KNEE ARTHROPLASTY Right 01/28/2016   Procedure: RIGHT TOTAL KNEE ARTHROPLASTY;  Surgeon: NLeandrew Koyanagi MD;  [place] [facility]  Service: Orthopedics;  Laterality: Right;   TRIGGER FINGER RELEASE Right 06/18/2014   Procedure: RIGHT LONG FINGER TRIGGER RELEASE;  Surgeon: NMarianna Payment MD;  [place] [facility]  Service: Orthopedics;  Laterality: Right;   TUBAL LIGATION     VAGINAL HYSTERECTOMY Bilateral 11/04/2016   Procedure: HYSTERECTOMY VAGINAL with Bilateral Salpingectomy;  Surgeon: HEldred Manges MD;  [facility];  Service: Gynecology;  Laterality: Bilateral;   WEIL OSTEOTOMY Right 07/07/2020   Procedure: RIGHT FOOT WEIL OSTEOTOMY 2, 3, AND 4 METATARSALS AND PROXIMAL INTERPHALANGEAL JOINT FUSION 2 & 4 TOES;  Surgeon: DNewt Minion MD;  [place] [facility]  Service: Orthopedics;  Laterality: Right;    I have reviewed the social history and family history with the patient and they are unchanged from previous note.  ALLERGIES:  is allergic to elemental sulfur and sulfa antibiotics.  MEDICATIONS:  Current Outpatient Medications  Medication Sig Dispense Refill   aspirin EC 81 MG tablet Take 81 mg by mouth daily. Swallow whole.     calcium carbonate (OSCAL) 1500 (600 Ca) MG TABS tablet Take 600 mg of elemental calcium by mouth daily.     Cholecalciferol (VITAMIN D3) 1.25 MG (50000 UT) CAPS Take 1 capsule by mouth once a week.     dapsone  100 MG tablet Take by mouth.     ferrous sulfate 325 (65 FE) MG tablet Take 325 mg by mouth 2 (two) times daily with a meal.     folic acid (FOLVITE) 1 MG tablet folic acid 1 mg tablet  TAKE 1 TABLET BY MOUTH EVERY DAY     naproxen sodium (ALEVE) 220 MG tablet as needed.     ondansetron (ZOFRAN) 8 MG tablet Take 1 tablet (8 mg total) by mouth every 8 (eight) hours as needed for nausea or vomiting. (Patient not taking: Reported on 04/27/2021) 20 tablet 3   potassium chloride (MICRO-K) 10 MEQ CR capsule Take by mouth.     prochlorperazine (COMPAZINE) 10 MG tablet Take 1 tablet (10 mg total) by mouth every 6 (six) hours as needed for nausea or vomiting. (Patient not taking: Reported on 04/27/2021) 30 tablet 3   REVLIMID 10 MG capsule Take 1 capsule (10 mg total) by mouth daily for 21 days. [employer] Auth # [id number]    Date Obtained 05/20/2021 21 capsule 0   valACYclovir (VALTREX) 500 MG tablet Take 500 mg by mouth 2 (two) times daily.  vitamin C (ASCORBIC ACID) 500 MG tablet      No current facility-administered medications for this visit.    PHYSICAL EXAMINATION: ECOG PERFORMANCE STATUS: 1 - Symptomatic but completely ambulatory  Vitals:   05/28/21 1428  BP: 111/72  Pulse: (!) 108  Resp: 18  Temp: 98.5 F (36.9 C)  SpO2: 98%   Wt Readings from Last 3 Encounters:  05/28/21 190 lb 9.6 oz (86.5 kg)  04/27/21 186 lb 6.4 oz (84.6 kg)  02/24/21 173 lb 12.8 oz (78.8 kg)     GENERAL:alert, no distress and comfortable SKIN: skin color normal, no rashes or significant lesions EYES: normal, Conjunctiva are pink and non-injected, sclera clear  NEURO: alert & oriented x 3 with fluent speech  LABORATORY DATA:  I have reviewed the data as listed CBC Latest Ref Rng & Units 05/28/2021 02/24/2021 02/24/2021  WBC 4.0 - 10.5 K/uL 6.0 5.2 -  Hemoglobin 12.0 - 15.0 g/dL 9.8(L) 8.8(L) -  Hematocrit 36.0 - 46.0 % 30.5(L) 27.6(L) -  Platelets 150 - 400 K/uL 167 178 178     CMP Latest Ref Rng &  Units 05/28/2021 02/24/2021 01/13/2021  Glucose 70 - 99 mg/dL 80 94 104(H)  BUN 6 - 20 mg/dL 19 9 6   Creatinine 0.44 - 1.00 mg/dL 0.90 0.68 0.74  Sodium 135 - 145 mmol/L 141 144 144  Potassium 3.5 - 5.1 mmol/L 3.6 3.4(L) 4.1  Chloride 98 - 111 mmol/L 105 106 104  CO2 22 - 32 mmol/L 31 27 31   Calcium 8.9 - 10.3 mg/dL 9.1 9.3 9.6  Total Protein 6.5 - 8.1 g/dL 7.1 6.5 6.2(L)  Total Bilirubin 0.3 - 1.2 mg/dL 0.7 0.9 0.4  Alkaline Phos 38 - 126 U/L 62 65 68  AST 15 - 41 U/L 15 16 19   ALT 0 - 44 U/L 11 8 17       RADIOGRAPHIC STUDIES: I have personally reviewed the radiological images as listed and agreed with the findings in the report. No results found.    No orders of the defined types were placed in this encounter.  All questions were answered. The patient knows to call the clinic with any problems, questions or concerns. No barriers to learning was detected. The total time spent in the appointment was 30 minutes.     Truitt Merle, MD 05/28/2021   I, Wilburn Mylar, am acting as scribe for Truitt Merle, MD.   I have reviewed the above documentation for accuracy and completeness, and I agree with the above.

## 2021-06-01 LAB — KAPPA/LAMBDA LIGHT CHAINS
Kappa free light chain: 29 mg/L — ABNORMAL HIGH (ref 3.3–19.4)
Kappa, lambda light chain ratio: 1.23 (ref 0.26–1.65)
Lambda free light chains: 23.5 mg/L (ref 5.7–26.3)

## 2021-06-02 ENCOUNTER — Encounter: Payer: Self-pay | Admitting: Hematology

## 2021-06-02 ENCOUNTER — Ambulatory Visit: Payer: BC Managed Care – PPO

## 2021-06-02 ENCOUNTER — Telehealth: Payer: Self-pay | Admitting: Hematology

## 2021-06-02 NOTE — Telephone Encounter (Signed)
Left message with follow-up appointment per 12/30 los.

## 2021-06-03 LAB — MULTIPLE MYELOMA PANEL, SERUM
Albumin SerPl Elph-Mcnc: 3.6 g/dL (ref 2.9–4.4)
Albumin/Glob SerPl: 1.4 (ref 0.7–1.7)
Alpha 1: 0.2 g/dL (ref 0.0–0.4)
Alpha2 Glob SerPl Elph-Mcnc: 0.5 g/dL (ref 0.4–1.0)
B-Globulin SerPl Elph-Mcnc: 0.9 g/dL (ref 0.7–1.3)
Gamma Glob SerPl Elph-Mcnc: 1 g/dL (ref 0.4–1.8)
Globulin, Total: 2.7 g/dL (ref 2.2–3.9)
IgA: 83 mg/dL — ABNORMAL LOW (ref 87–352)
IgG (Immunoglobin G), Serum: 1099 mg/dL (ref 586–1602)
IgM (Immunoglobulin M), Srm: 23 mg/dL — ABNORMAL LOW (ref 26–217)
Total Protein ELP: 6.3 g/dL (ref 6.0–8.5)

## 2021-06-04 ENCOUNTER — Other Ambulatory Visit: Payer: Self-pay

## 2021-06-04 ENCOUNTER — Ambulatory Visit: Payer: BC Managed Care – PPO | Attending: Family | Admitting: Physical Therapy

## 2021-06-04 ENCOUNTER — Encounter: Payer: Self-pay | Admitting: Physical Therapy

## 2021-06-04 DIAGNOSIS — C9000 Multiple myeloma not having achieved remission: Secondary | ICD-10-CM

## 2021-06-04 DIAGNOSIS — M6281 Muscle weakness (generalized): Secondary | ICD-10-CM

## 2021-06-04 DIAGNOSIS — R5383 Other fatigue: Secondary | ICD-10-CM | POA: Diagnosis not present

## 2021-06-04 DIAGNOSIS — R262 Difficulty in walking, not elsewhere classified: Secondary | ICD-10-CM | POA: Diagnosis not present

## 2021-06-04 NOTE — Therapy (Signed)
[facility] @ [place], [place], [address] Phone: [phone]   Fax:  [phone]  Physical Therapy Treatment  Patient Details  Name: Hannah [name] MRN: [id number] Date of Birth: Mar 01, 1961 Referring Provider (PT): Cira Rue   Encounter Date: 06/04/2021   PT End of Session - 06/04/21 1210     Visit Number 38    Number of Visits 50    Date for PT Re-Evaluation 07/13/21    PT Start Time 1210    PT Stop Time 1300    PT Time Calculation (min) 50 min    Activity Tolerance Patient tolerated treatment well    Behavior During Therapy [facility] for tasks assessed/performed             Past Medical History:  Diagnosis Date   Allergy    Anemia    Anxiety    Arthritis    Asthma    Depression    Hypertension     Past Surgical History:  Procedure Laterality Date   CHOLECYSTECTOMY  1993   COLONOSCOPY     TOTAL KNEE ARTHROPLASTY Right 01/28/2016   TOTAL KNEE ARTHROPLASTY Right 01/28/2016   Procedure: RIGHT TOTAL KNEE ARTHROPLASTY;  Surgeon: [name], MD;  Location: [facility];  Service: Orthopedics;  Laterality: Right;   TRIGGER FINGER RELEASE Right 06/18/2014   Procedure: RIGHT LONG FINGER TRIGGER RELEASE;  Surgeon: [name], MD;  Location: [place];  Service: Orthopedics;  Laterality: Right;   TUBAL LIGATION     VAGINAL HYSTERECTOMY Bilateral 11/04/2016   Procedure: HYSTERECTOMY VAGINAL with Bilateral Salpingectomy;  Surgeon: [name], MD;  Location: [facility];  Service: Gynecology;  Laterality: Bilateral;   WEIL OSTEOTOMY Right 07/07/2020   Procedure: RIGHT FOOT WEIL OSTEOTOMY 2, 3, AND 4 METATARSALS AND PROXIMAL INTERPHALANGEAL JOINT FUSION 2 & 4 TOES;  Surgeon: Newt Minion, MD;  Location: [facility];  Service: Orthopedics;  Laterality: Right;    There were no vitals filed for this visit.   Subjective Assessment - 06/04/21 1551     Subjective No new complaints  today. I would like to begin going to the gym to do the Nustep.    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 She as diagnosed in February 2022 with Multiple Myeloma not in remission. 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 No/denies              Treatment: Patient seen for aquatic therapy today.  Treatment took place in water 2.5-4 feet deep depending upon activity.  Pt entered the pool via stairs with mild use of rails. Water temp 92 degrees F. Pt requires buoyancy of water for support and to offload joints with strengthening exercises.  Pt utilizes viscosity of the water required for strengthening.   Seated water bench with 75% submersion Pt performed seated LE AROM exercises 20x in all planes,   Standing: 75% submersion for the following: water walking with runners arms 10x each direction including sidestepping with mitten hands today ( paddles unavailable). Yellow noodle high knee marching 8x, intermittent ( but short: < 1 min) seated decompression float for rest. 3 min underwater bicycle 3 bouts with yellow noodle. Hip circumduction 15x each direction, still requires a few fingers for balance on pool wall.  PT Long Term Goals - 05/18/21 8756       PT LONG TERM GOAL #1   Title Pt will return to the gym    Baseline not yet, but improving and concerned about covid/fluid    Time 8    Period Weeks    Status On-going    Target Date 07/13/21      PT LONG TERM GOAL #2   Title Pt will improve 21mn walk test to at least 10070fto demonstrate decreased fall risk    Baseline 643, 1061 today    Time 8    Period Weeks    Status Achieved    Target Date 05/18/21      PT LONG TERM GOAL #3   Title Pt will be able to perform a 5 x sts in 20 sec or less showing improved LE strength  and    Baseline 18:40    Time 8    Period Weeks    Status Achieved    Target Date 05/18/21      PT LONG TERM GOAL #4   Title Pt will be able to go up and down stairs at home    Time 8    Period Weeks    Status Achieved      PT LONG TERM GOAL #5   Title Able to hold tandem stance and SLS for 10 seconds.    Baseline tandem today met with left foot forward, not met right forward,  SLS only held 3-5 sec    Time 8    Period Weeks    Status On-going    Target Date 07/13/21      Additional Long Term Goals   Additional Long Term Goals Yes      PT LONG TERM GOAL #6   Title Able to walk 1250 ft in 6 min to demonstrate improved gait speed    Time 8    Period Weeks    Status New    Target Date 07/13/21                   Plan - 06/04/21 1211     Clinical Impression Statement Pt arrives feeling good to aquatic PT session. Pt has expressed interest in going to the gym to do the Nustep in order to work on her endurance. PTA suggested she discuss with Robin next week, Pt able to increase her underwater bicycle back to 3 min 3 bouts without any fatigue.    Personal Factors and Comorbidities Comorbidity 3+    Comorbidities Multiple Myeloma, multi joint OA, recent foot surgery, fall risk,SCT    Examination-Activity Limitations Sleep;Squat;Stand;Stairs;Transfers;Locomotion Level;Sit    Examination-Participation Restrictions Cleaning;Community Activity;Occupation;Shop    Stability/Clinical Decision Making Stable/Uncomplicated    Rehab Potential Good    PT Frequency 2x / week    PT Duration 8 weeks    PT Treatment/Interventions ADLs/Self Care Home Management;Aquatic Therapy;Gait training;Stair training;Functional mobility training;Therapeutic activities;Therapeutic exercise;Balance training;Neuromuscular re-education;Manual techniques;Patient/family education;Passive range of motion;Joint Manipulations    PT Next Visit Plan STM left LB and gluts, core strength/stabs, endurance     Consulted and Agree with Plan of Care Patient             Patient will benefit from skilled therapeutic intervention in order to improve the following deficits and impairments:  Abnormal gait, Decreased mobility, Decreased activity tolerance, Decreased endurance, Decreased strength, Decreased balance, Decreased knowledge of precautions, Difficulty walking, Postural dysfunction, Pain  Visit Diagnosis: Muscle weakness (generalized)  Difficulty  in walking, not elsewhere classified  Multiple myeloma not having achieved remission (HCC)  Fatigue, unspecified type     Problem List Patient Active Problem List   Diagnosis Date Noted   Multiple myeloma ([place]) 07/13/2020   Claw toe, right    Trigger finger, left middle finger 04/09/2020   Stenosing tenosynovitis of finger of left hand 02/27/2020   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   Major depression, recurrent ([place]) 01/12/2018   Chronic right shoulder pain 12/20/2017   MDD (major depressive disorder), recurrent episode, moderate ([place]) 11/04/2017   MGUS (monoclonal gammopathy of unknown significance) 10/24/2017   Primary osteoarthritis of both hands 09/26/2017   Primary osteoarthritis of right shoulder 09/26/2017   Primary osteoarthritis of both hips 09/26/2017   Status post total knee replacement, right 09/26/2017   Primary osteoarthritis of both feet 09/26/2017   History of asthma 09/26/2017   Primary osteoarthritis of left knee 04/18/2017   Menorrhagia 11/04/2016   Fibroid tumor 11/04/2016   Adenomyosis 11/04/2016   Hypertension, essential 11/04/2016   Total knee replacement status 01/28/2016    Capria Cartaya, PTA 06/04/2021, 3:56 PM  [facility] @ [place], [place], [address] Phone: [phone]   Fax:  [phone]  Name: MARKEETA SCALF MRN: [id number] Date of Birth: 07-Nov-1960

## 2021-06-07 ENCOUNTER — Ambulatory Visit: Payer: BC Managed Care – PPO

## 2021-06-07 ENCOUNTER — Other Ambulatory Visit: Payer: Self-pay

## 2021-06-07 DIAGNOSIS — C9000 Multiple myeloma not having achieved remission: Secondary | ICD-10-CM

## 2021-06-07 DIAGNOSIS — R262 Difficulty in walking, not elsewhere classified: Secondary | ICD-10-CM

## 2021-06-07 DIAGNOSIS — R5383 Other fatigue: Secondary | ICD-10-CM

## 2021-06-07 DIAGNOSIS — M6281 Muscle weakness (generalized): Secondary | ICD-10-CM | POA: Diagnosis not present

## 2021-06-07 NOTE — Therapy (Signed)
[facility] @ [place], [place], [address] Phone: [phone]   Fax:  [phone]  Physical Therapy Treatment  Patient Details  Name: Hannah [name] MRN: [id number] Date of Birth: 1961/02/26 Referring Provider (PT): Cira Rue   Encounter Date: 06/07/2021   PT End of Session - 06/07/21 0815     Visit Number 39    Number of Visits 50    Date for PT Re-Evaluation 07/13/21    PT Start Time 0810    PT Stop Time 0853    PT Time Calculation (min) 43 min    Activity Tolerance Patient tolerated treatment well    Behavior During Therapy [facility] for tasks assessed/performed             Past Medical History:  Diagnosis Date   Allergy    Anemia    Anxiety    Arthritis    Asthma    Depression    Hypertension     Past Surgical History:  Procedure Laterality Date   CHOLECYSTECTOMY  1993   COLONOSCOPY     TOTAL KNEE ARTHROPLASTY Right 01/28/2016   TOTAL KNEE ARTHROPLASTY Right 01/28/2016   Procedure: RIGHT TOTAL KNEE ARTHROPLASTY;  Surgeon: [name], MD;  Location: [facility];  Service: Orthopedics;  Laterality: Right;   TRIGGER FINGER RELEASE Right 06/18/2014   Procedure: RIGHT LONG FINGER TRIGGER RELEASE;  Surgeon: [name], MD;  Location: [facility];  Service: Orthopedics;  Laterality: Right;   TUBAL LIGATION     VAGINAL HYSTERECTOMY Bilateral 11/04/2016   Procedure: HYSTERECTOMY VAGINAL with Bilateral Salpingectomy;  Surgeon: [name], MD;  Location: [facility];  Service: Gynecology;  Laterality: Bilateral;   WEIL OSTEOTOMY Right 07/07/2020   Procedure: RIGHT FOOT WEIL OSTEOTOMY 2, 3, AND 4 METATARSALS AND PROXIMAL INTERPHALANGEAL JOINT FUSION 2 & 4 TOES;  Surgeon: Newt Minion, MD;  Location: [facility];  Service: Orthopedics;  Laterality: Right;    There were no vitals filed for this visit.   Subjective Assessment - 06/07/21 0813     Subjective doing pretty well  so far.  Pool is going really well. Fatigue still very quickly, but improved.  Late afternoon I start getting pain in my back. I feel stronger.    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 She as diagnosed in February 2022 with Multiple Myeloma not in remission. 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 18mn    How long can you walk comfortably? 580m    Diagnostic tests no scans scheduled recently    Patient Stated Goals get strong, build up the muscles    Pain Score 3     Pain Location Back    Pain Orientation Lower    Pain Descriptors / Indicators Aching    Pain Type Chronic pain    Pain Onset More than a month ago    Pain Frequency Intermittent    Pain Score 4    Pain Location Shoulder    Pain Descriptors / Indicators Aching    Pain Type Chronic pain    Pain Onset More than a month ago    Pain Frequency Constant  [facility] Adult PT Treatment/Exercise - 06/07/21 0001       Exercises   Other Exercises  Nu step seat 9, UE 9 lev 5 x 7 min, tandem stance x 2 ea side, SLS x 2 ea with emphasis on focus and core. SLS x 1 ea with cone touch,  scapular retraction yellow x 15, shoulder extension yellow x 15,LAQ 3 # 2 x10, ppt x 10, ppt with march and alt. hand to knee 2 x 10,  ppt with SLR 2 x 5 bilateral, piriformis stretch x 2 bilaterally. HS stretch with strap x 2                          PT Long Term Goals - 05/18/21 0823       PT LONG TERM GOAL #1   Title Pt will return to the gym    Baseline not yet, but improving and concerned about covid/fluid    Time 8    Period Weeks    Status On-going    Target Date 07/13/21      PT LONG TERM GOAL #2   Title Pt will improve 71mn walk test to at least 10064fto demonstrate decreased fall risk    Baseline 643, 1061 today    Time 8     Period Weeks    Status Achieved    Target Date 05/18/21      PT LONG TERM GOAL #3   Title Pt will be able to perform a 5 x sts in 20 sec or less showing improved LE strength and    Baseline 18:40    Time 8    Period Weeks    Status Achieved    Target Date 05/18/21      PT LONG TERM GOAL #4   Title Pt will be able to go up and down stairs at home    Time 8    Period Weeks    Status Achieved      PT LONG TERM GOAL #5   Title Able to hold tandem stance and SLS for 10 seconds.    Baseline tandem today met with left foot forward, not met right forward,  SLS only held 3-5 sec    Time 8    Period Weeks    Status On-going    Target Date 07/13/21      Additional Long Term Goals   Additional Long Term Goals Yes      PT LONG TERM GOAL #6   Title Able to walk 1250 ft in 6 min to demonstrate improved gait speed    Time 8    Period Weeks    Status New    Target Date 07/13/21                   Plan - 06/07/21 0815     Clinical Impression Statement Pt demonstrated improved tandem and SLS today, and was able to do SLS with cone touches bilaterally.  She fatigued with standing exercises so ended with supine exercises for core strength and flexibility.  Left LE noticeably weaker with SLR today. Pt advised that she was fatigued and preferred not to do any more standing exercises today.  Shoulder aching better after rx and she had no increased LBP.    Personal Factors and Comorbidities Comorbidity 3+    Comorbidities Multiple Myeloma, multi joint OA, recent foot surgery, fall risk,SCT    Examination-Activity Limitations Sleep;Squat;Stand;Stairs;Transfers;Locomotion Level;Sit  Examination-Participation Restrictions Cleaning;Community Activity;Occupation;Shop    Stability/Clinical Decision Making Stable/Uncomplicated    Rehab Potential Good    PT Frequency 2x / week    PT Duration 8 weeks    PT Treatment/Interventions ADLs/Self Care Home Management;Aquatic Therapy;Gait  training;Stair training;Functional mobility training;Therapeutic activities;Therapeutic exercise;Balance training;Neuromuscular re-education;Manual techniques;Patient/family education;Passive range of motion;Joint Manipulations    PT Next Visit Plan STM left LB and gluts, core strength/stabs, endurance    PT Home Exercise Plan mini squats, sidestepping, step ups,stretches,ppt,    Consulted and Agree with Plan of Care Patient             Patient will benefit from skilled therapeutic intervention in order to improve the following deficits and impairments:  Abnormal gait, Decreased mobility, Decreased activity tolerance, Decreased endurance, Decreased strength, Decreased balance, Decreased knowledge of precautions, Difficulty walking, Postural dysfunction, Pain  Visit Diagnosis: Muscle weakness (generalized)  Difficulty in walking, not elsewhere classified  Multiple myeloma not having achieved remission (HCC)  Fatigue, unspecified type     Problem List Patient Active Problem List   Diagnosis Date Noted   Multiple myeloma ([place]) 07/13/2020   Claw toe, right    Trigger finger, left middle finger 04/09/2020   Stenosing tenosynovitis of finger of left hand 02/27/2020   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   Major depression, recurrent ([place]) 01/12/2018   Chronic right shoulder pain 12/20/2017   MDD (major depressive disorder), recurrent episode, moderate ([place]) 11/04/2017   MGUS (monoclonal gammopathy of unknown significance) 10/24/2017   Primary osteoarthritis of both hands 09/26/2017   Primary osteoarthritis of right shoulder 09/26/2017   Primary osteoarthritis of both hips 09/26/2017   Status post total knee replacement, right 09/26/2017   Primary osteoarthritis of both feet 09/26/2017   History of asthma 09/26/2017   Primary osteoarthritis of left knee 04/18/2017   Menorrhagia 11/04/2016   Fibroid tumor 11/04/2016   Adenomyosis 11/04/2016   Hypertension, essential 11/04/2016    Total knee replacement status 01/28/2016    Claris Pong, PT 06/07/2021, 9:11 AM  [facility] @ [place], [place], [address] Phone: [phone]   Fax:  [phone]  Name: Hannah [name] MRN: [id number] Date of Birth: [birth year]

## 2021-06-09 ENCOUNTER — Encounter: Payer: Self-pay | Admitting: Physical Therapy

## 2021-06-09 ENCOUNTER — Ambulatory Visit: Payer: BC Managed Care – PPO | Admitting: Physical Therapy

## 2021-06-09 ENCOUNTER — Other Ambulatory Visit: Payer: Self-pay

## 2021-06-09 DIAGNOSIS — C9000 Multiple myeloma not having achieved remission: Secondary | ICD-10-CM

## 2021-06-09 DIAGNOSIS — M6281 Muscle weakness (generalized): Secondary | ICD-10-CM | POA: Diagnosis not present

## 2021-06-09 DIAGNOSIS — R262 Difficulty in walking, not elsewhere classified: Secondary | ICD-10-CM | POA: Diagnosis not present

## 2021-06-09 DIAGNOSIS — R5383 Other fatigue: Secondary | ICD-10-CM | POA: Diagnosis not present

## 2021-06-09 NOTE — Therapy (Signed)
[facility] @ [place], [place], [address] Phone: [phone]   Fax:  [phone]  Physical Therapy Treatment  Patient Details  Name: Hannah [name] MRN: [id number] Date of Birth: 1960/11/24 Referring Provider (PT): Cira Rue   Encounter Date: 06/09/2021   PT End of Session - 06/09/21 0808     Visit Number 40    Number of Visits 50    Date for PT Re-Evaluation 07/13/21    PT Start Time 0807   pt late   PT Stop Time 0846    PT Time Calculation (min) 39 min    Activity Tolerance Patient tolerated treatment well    Behavior During Therapy [facility] for tasks assessed/performed             Past Medical History:  Diagnosis Date   Allergy    Anemia    Anxiety    Arthritis    Asthma    Depression    Hypertension     Past Surgical History:  Procedure Laterality Date   CHOLECYSTECTOMY  1993   COLONOSCOPY     TOTAL KNEE ARTHROPLASTY Right 01/28/2016   TOTAL KNEE ARTHROPLASTY Right 01/28/2016   Procedure: RIGHT TOTAL KNEE ARTHROPLASTY;  Surgeon: [name], MD;  Location: [facility];  Service: Orthopedics;  Laterality: Right;   TRIGGER FINGER RELEASE Right 06/18/2014   Procedure: RIGHT LONG FINGER TRIGGER RELEASE;  Surgeon: [name], MD;  Location: [facility];  Service: Orthopedics;  Laterality: Right;   TUBAL LIGATION     VAGINAL HYSTERECTOMY Bilateral 11/04/2016   Procedure: HYSTERECTOMY VAGINAL with Bilateral Salpingectomy;  Surgeon: [name], MD;  Location: [facility];  Service: Gynecology;  Laterality: Bilateral;   WEIL OSTEOTOMY Right 07/07/2020   Procedure: RIGHT FOOT WEIL OSTEOTOMY 2, 3, AND 4 METATARSALS AND PROXIMAL INTERPHALANGEAL JOINT FUSION 2 & 4 TOES;  Surgeon: Newt Minion, MD;  Location: [facility];  Service: Orthopedics;  Laterality: Right;    There were no vitals filed for this visit.   Subjective Assessment - 06/09/21 0845     Subjective Pt RT  shoulder has been aggrevating as of late. Reports being pleased with her endurance lately.    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 She as diagnosed in February 2022 with Multiple Myeloma not in remission. 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 No/denies   Intermittent RT shoulder bothers her   Multiple Pain Sites No             Treatment: Patient seen for aquatic therapy today.  Treatment took place in water 3.5-4.5 feet deep depending upon activity.  Pt entered the pool via stairs with light use of hand rails. Water temp is 93 degrees F. Pt requires buoyancy of water for support and to offload joints with strengthening exercises.  Pt utilizes viscosity of the water required for strengthening.   Standing in 75% depth: slower moving in the beginning: water walking ( slow ) 10x forward and back with slow joggers arms. Hand paddles added for side stepping with minin squat 10x. High knee marching no AD 6x lengths. Tandem walking 4 lengths, initially wobbly but improved with practice. Hip circumduction 10x each direction Bil, no UE today for balance required. Abdominal/core compressions 5 sec hold 10x. Seated decompression float to rest  1 min  f/b 2 bouts of 4:30 underwater bicycle with noodle behind pt.     ° ° ° ° ° ° ° ° ° ° ° ° ° ° ° ° ° ° ° ° ° ° ° ° ° ° ° ° ° ° ° PT Long Term Goals - 05/18/21 0823   ° °  ° PT LONG TERM GOAL #1  ° Title Pt will return to the gym   ° Baseline not yet, but improving and concerned about covid/fluid   ° Time 8   ° Period Weeks   ° Status On-going   ° Target Date 07/13/21   °  ° PT LONG TERM GOAL #2  ° Title Pt will improve 6min walk test to at least 1000ft to demonstrate decreased fall risk   ° Baseline 643, 1061 today   ° Time 8   ° Period Weeks   ° Status Achieved   ° Target Date 05/18/21   °  ° PT LONG TERM GOAL #3  ° Title Pt will be  able to perform a 5 x sts in 20 sec or less showing improved LE strength and   ° Baseline 18:40   ° Time 8   ° Period Weeks   ° Status Achieved   ° Target Date 05/18/21   °  ° PT LONG TERM GOAL #4  ° Title Pt will be able to go up and down stairs at home   ° Time 8   ° Period Weeks   ° Status Achieved   °  ° PT LONG TERM GOAL #5  ° Title Able to hold tandem stance and SLS for 10 seconds.   ° Baseline tandem today met with left foot forward, not met right forward,  SLS only held 3-5 sec   ° Time 8   ° Period Weeks   ° Status On-going   ° Target Date 07/13/21   °  ° Additional Long Term Goals  ° Additional Long Term Goals Yes   °  ° PT LONG TERM GOAL #6  ° Title Able to walk 1250 ft in 6 min to demonstrate improved gait speed   ° Time 8   ° Period Weeks   ° Status New   ° Target Date 07/13/21   ° °  °  ° °  ° ° ° ° ° ° ° ° Plan - 06/09/21 0846   ° ° Clinical Impression Statement Pt's endurance greatly improving toleraing 2 bouts of 4-5 min underwater bicycle today as a moderate pace. Pt is feeling a little down today as she will accompany her good friend to her first oncology team meeting S/P being diagnosed with breast cancer. Pt did not require extra buoyancy support for high knee marching today, otherwise pt tolerates her aquatic session very well.   ° Personal Factors and Comorbidities Comorbidity 3+   ° Comorbidities Multiple Myeloma, multi joint OA, recent foot surgery, fall risk,SCT   ° Examination-Activity Limitations Sleep;Squat;Stand;Stairs;Transfers;Locomotion Level;Sit   ° Examination-Participation Restrictions Cleaning;Community Activity;Occupation;Shop   ° Stability/Clinical Decision Making Stable/Uncomplicated   ° Rehab Potential Good   ° PT Frequency 2x / week   ° PT Duration 8 weeks   ° PT Treatment/Interventions ADLs/Self Care Home Management;Aquatic Therapy;Gait training;Stair training;Functional mobility training;Therapeutic activities;Therapeutic exercise;Balance training;Neuromuscular  re-education;Manual techniques;Patient/family education;Passive range of motion;Joint Manipulations   ° PT Next Visit Plan STM left LB and gluts, core strength/stabs, endurance   ° Consulted and Agree with Plan of Care Patient   ° °  °  ° °  ° ° °  Patient will benefit from skilled therapeutic intervention in order to improve the following deficits and impairments:  Abnormal gait, Decreased mobility, Decreased activity tolerance, Decreased endurance, Decreased strength, Decreased balance, Decreased knowledge of precautions, Difficulty walking, Postural dysfunction, Pain ° °Visit Diagnosis: °Muscle weakness (generalized) ° °Difficulty in walking, not elsewhere classified ° °Multiple myeloma not having achieved remission (HCC) ° ° ° ° °Problem List °Patient Active Problem List  ° Diagnosis Date Noted  ° Multiple myeloma (HCC) 07/13/2020  ° Claw toe, right   ° Trigger finger, left middle finger 04/09/2020  ° Stenosing tenosynovitis of finger of left hand 02/27/2020  ° Hemoglobin C trait (HCC) 02/27/2018  ° Major depression, recurrent ([facility]) 01/12/2018  ° Chronic right shoulder pain 12/20/2017  ° MDD (major depressive disorder), recurrent episode, moderate ([facility]) 11/04/2017  ° MGUS (monoclonal gammopathy of unknown significance) 10/24/2017  ° Primary osteoarthritis of both hands 09/26/2017  ° Primary osteoarthritis of right shoulder 09/26/2017  ° Primary osteoarthritis of both hips 09/26/2017  ° Status post total knee replacement, right 09/26/2017  ° Primary osteoarthritis of both feet 09/26/2017  ° History of asthma 09/26/2017  ° Primary osteoarthritis of left knee 04/18/2017  ° Menorrhagia 11/04/2016  ° Fibroid tumor 11/04/2016  ° Adenomyosis 11/04/2016  ° Hypertension, essential 11/04/2016  ° Total knee replacement status 01/28/2016  ° ° °COCHRAN,JENNIFER, PTA °06/09/2021, 8:51 AM ° °[place] °[facility] [place] °[address] °[place], [place], [address] °Phone: [phone]   Fax:   [phone] ° °Name: Hannah [name] °MRN: [id number] °Date of Birth: 09/12/1960 ° ° ° °

## 2021-06-16 ENCOUNTER — Encounter: Payer: Self-pay | Admitting: Physical Therapy

## 2021-06-16 ENCOUNTER — Other Ambulatory Visit: Payer: Self-pay

## 2021-06-16 ENCOUNTER — Ambulatory Visit: Payer: BC Managed Care – PPO | Admitting: Physical Therapy

## 2021-06-16 DIAGNOSIS — M6281 Muscle weakness (generalized): Secondary | ICD-10-CM

## 2021-06-16 DIAGNOSIS — C9000 Multiple myeloma not having achieved remission: Secondary | ICD-10-CM | POA: Diagnosis not present

## 2021-06-16 DIAGNOSIS — R5383 Other fatigue: Secondary | ICD-10-CM

## 2021-06-16 DIAGNOSIS — R262 Difficulty in walking, not elsewhere classified: Secondary | ICD-10-CM | POA: Diagnosis not present

## 2021-06-16 NOTE — Therapy (Signed)
[facility] @ [facility] [place], [place], [address] Phone: [phone]   Fax:  [phone]  Physical Therapy Treatment  Patient Details  Name: Hannah [name] MRN: [id number] Date of Birth: [birth year] Referring Provider (PT): Cira Rue   Encounter Date: 06/16/2021   PT End of Session - 06/16/21 0811     Visit Number 41    Number of Visits 50    Date for PT Re-Evaluation 07/13/21    PT Start Time 0810   10 min late   PT Stop Time 0850    PT Time Calculation (min) 40 min    Activity Tolerance Patient tolerated treatment well    Behavior During Therapy [facility] for tasks assessed/performed             Past Medical History:  Diagnosis Date   Allergy    Anemia    Anxiety    Arthritis    Asthma    Depression    Hypertension     Past Surgical History:  Procedure Laterality Date   CHOLECYSTECTOMY  1993   COLONOSCOPY     TOTAL KNEE ARTHROPLASTY Right 01/28/2016   TOTAL KNEE ARTHROPLASTY Right 01/28/2016   Procedure: RIGHT TOTAL KNEE ARTHROPLASTY;  Surgeon: [name], MD;  Location: [facility];  Service: Orthopedics;  Laterality: Right;   TRIGGER FINGER RELEASE Right 06/18/2014   Procedure: RIGHT LONG FINGER TRIGGER RELEASE;  Surgeon: [name], MD;  Location: [facility];  Service: Orthopedics;  Laterality: Right;   TUBAL LIGATION     VAGINAL HYSTERECTOMY Bilateral 11/04/2016   Procedure: HYSTERECTOMY VAGINAL with Bilateral Salpingectomy;  Surgeon: [name], MD;  Location: [facility];  Service: Gynecology;  Laterality: Bilateral;   WEIL OSTEOTOMY Right 07/07/2020   Procedure: RIGHT FOOT WEIL OSTEOTOMY 2, 3, AND 4 METATARSALS AND PROXIMAL INTERPHALANGEAL JOINT FUSION 2 & 4 TOES;  Surgeon: Newt Minion, MD;  Location: [facility];  Service: Orthopedics;  Laterality: Right;    There were no vitals filed for this visit.   Subjective Assessment - 06/16/21 0942     Subjective I  really need to do some exercise on the days i am not in therapy.    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 She as diagnosed in February 2022 with Multiple Myeloma not in remission. 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 No/denies           Treatment: Patient seen for aquatic therapy today.  Treatment took place in water 3.5-4.5 feet deep depending upon activity.  Pt entered the pool via stairs with mild use of the rails. Water temp 92 degrees F. Pt requires buoyancy of water for support and to offload joints with strengthening exercises.  Pt utilizes viscosity of the water required for strengthening.   Seated water bench with 75% submersion Pt performed seated LE AROM exercises 20x in all planes, discussion of current status. Pt may need to miss an appt next week as she will be taking her friend to chemo. Standing 75% depth: Water walking 10x in each direction with runners arms, hand paddles with side stepping 10x and mini squat. Standing childs pose stretch 1x 20 sec. High knee marching 6x with blue noodle for postural support. HAnd paddles shoulder flex/ext 10x, multicolored UE weights for shoulder horizontal add/abd 2x10, core compressions  with blue noodle 10x 5 sec hold. 5 min continuous underwater bicycle with yellow noodle.                                   PT Long Term Goals - 05/18/21 7494       PT LONG TERM GOAL #1   Title Pt will return to the gym    Baseline not yet, but improving and concerned about covid/fluid    Time 8    Period Weeks    Status On-going    Target Date 07/13/21      PT LONG TERM GOAL #2   Title Pt will improve 90mn walk test to at least 10060fto demonstrate decreased fall risk    Baseline 643, 1061 today    Time 8    Period Weeks    Status Achieved    Target Date 05/18/21      PT LONG  TERM GOAL #3   Title Pt will be able to perform a 5 x sts in 20 sec or less showing improved LE strength and    Baseline 18:40    Time 8    Period Weeks    Status Achieved    Target Date 05/18/21      PT LONG TERM GOAL #4   Title Pt will be able to go up and down stairs at home    Time 8    Period Weeks    Status Achieved      PT LONG TERM GOAL #5   Title Able to hold tandem stance and SLS for 10 seconds.    Baseline tandem today met with left foot forward, not met right forward,  SLS only held 3-5 sec    Time 8    Period Weeks    Status On-going    Target Date 07/13/21      Additional Long Term Goals   Additional Long Term Goals Yes      PT LONG TERM GOAL #6   Title Able to walk 1250 ft in 6 min to demonstrate improved gait speed    Time 8    Period Weeks    Status New    Target Date 07/13/21                   Plan - 06/16/21 [id number]   Clinical Impression Statement Pt had some complaints of her shoulders aching so there was some focused exercises on the posterior shoulder for increased support to the joint. Pt's endurance continues to improve doing the underwater bicycle for a continous 5 min with little to no fatigue, Pt was alittle late for her session today so we were only able to do 1 bout of bicycle. Otherwise pt tolerating all exercises well.    Personal Factors and Comorbidities Comorbidity 3+    Comorbidities Multiple Myeloma, multi joint OA, recent foot surgery, fall risk,SCT    Examination-Activity Limitations Sleep;Squat;Stand;Stairs;Transfers;Locomotion Level;Sit    Stability/Clinical Decision Making Stable/Uncomplicated    Rehab Potential Good    PT Frequency 2x / week    PT Duration 8 weeks    PT Treatment/Interventions ADLs/Self Care Home Management;Aquatic Therapy;Gait training;Stair training;Functional mobility training;Therapeutic activities;Therapeutic exercise;Balance training;Neuromuscular re-education;Manual techniques;Patient/family  education;Passive range of motion;Joint Manipulations    PT Next Visit Plan STM left LB and gluts, core strength/stabs, endurance    Consulted and Agree with Plan of Care Patient  Patient will benefit from skilled therapeutic intervention in order to improve the following deficits and impairments:  Abnormal gait, Decreased mobility, Decreased activity tolerance, Decreased endurance, Decreased strength, Decreased balance, Decreased knowledge of precautions, Difficulty walking, Postural dysfunction, Pain  Visit Diagnosis: Muscle weakness (generalized)  Difficulty in walking, not elsewhere classified  Multiple myeloma not having achieved remission (HCC)  Fatigue, unspecified type     Problem List Patient Active Problem List   Diagnosis Date Noted   Multiple myeloma ([facility]) 07/13/2020   Claw toe, right    Trigger finger, left middle finger 04/09/2020   Stenosing tenosynovitis of finger of left hand 02/27/2020   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   Major depression, recurrent ([facility]) 01/12/2018   Chronic right shoulder pain 12/20/2017   MDD (major depressive disorder), recurrent episode, moderate ([facility]) 11/04/2017   MGUS (monoclonal gammopathy of unknown significance) 10/24/2017   Primary osteoarthritis of both hands 09/26/2017   Primary osteoarthritis of right shoulder 09/26/2017   Primary osteoarthritis of both hips 09/26/2017   Status post total knee replacement, right 09/26/2017   Primary osteoarthritis of both feet 09/26/2017   History of asthma 09/26/2017   Primary osteoarthritis of left knee 04/18/2017   Menorrhagia 11/04/2016   Fibroid tumor 11/04/2016   Adenomyosis 11/04/2016   Hypertension, essential 11/04/2016   Total knee replacement status 01/28/2016    Marianela Mandrell, PTA 06/16/2021, 9:01 PM  [facility] @ [address] [place] [place], [place], [address] Phone: [phone]   Fax:   [phone]  Name: SRAVYA GRISSOM MRN: [id number] Date of Birth: Dec 04, 1960

## 2021-06-17 ENCOUNTER — Other Ambulatory Visit: Payer: Self-pay | Admitting: Nurse Practitioner

## 2021-06-18 ENCOUNTER — Other Ambulatory Visit: Payer: Self-pay

## 2021-06-18 DIAGNOSIS — C9000 Multiple myeloma not having achieved remission: Secondary | ICD-10-CM

## 2021-06-18 MED ORDER — LENALIDOMIDE 10 MG PO CAPS: 10.0000 mg | ORAL_CAPSULE | Freq: Every day | ORAL | 0 refills | Status: DC

## 2021-06-21 ENCOUNTER — Ambulatory Visit: Payer: BC Managed Care – PPO

## 2021-06-22 ENCOUNTER — Encounter: Payer: Self-pay | Admitting: Hematology

## 2021-06-22 DIAGNOSIS — R232 Flushing: Secondary | ICD-10-CM | POA: Diagnosis not present

## 2021-06-22 DIAGNOSIS — C9000 Multiple myeloma not having achieved remission: Secondary | ICD-10-CM | POA: Diagnosis not present

## 2021-06-22 DIAGNOSIS — C9001 Multiple myeloma in remission: Secondary | ICD-10-CM | POA: Diagnosis not present

## 2021-06-22 DIAGNOSIS — R5383 Other fatigue: Secondary | ICD-10-CM | POA: Diagnosis not present

## 2021-06-22 DIAGNOSIS — Z9484 Stem cells transplant status: Secondary | ICD-10-CM | POA: Diagnosis not present

## 2021-06-22 DIAGNOSIS — Z23 Encounter for immunization: Secondary | ICD-10-CM | POA: Diagnosis not present

## 2021-06-22 NOTE — Progress Notes (Signed)
Patient called regarding information discussed earlier.  Advised what is needed to apply for grant to proceed forward as well as for other possible grants and assistance.  She verbalized understanding and has my card for any additional financial questions or concerns.

## 2021-06-22 NOTE — Progress Notes (Signed)
Met with patient whom was accompanying another patient and asked about [employer].  Discussed one-time $1000 Radio broadcast assistant to assist with personal expenses while going through treatment. Advised what is needed to apply.  Gave her my card if interested in applying and for any additional financial questions or concerns.

## 2021-06-23 ENCOUNTER — Encounter: Payer: Self-pay | Admitting: Physical Therapy

## 2021-06-23 ENCOUNTER — Ambulatory Visit: Payer: BC Managed Care – PPO | Admitting: Physical Therapy

## 2021-06-23 ENCOUNTER — Other Ambulatory Visit: Payer: Self-pay

## 2021-06-23 DIAGNOSIS — R5383 Other fatigue: Secondary | ICD-10-CM | POA: Diagnosis not present

## 2021-06-23 DIAGNOSIS — M6281 Muscle weakness (generalized): Secondary | ICD-10-CM | POA: Diagnosis not present

## 2021-06-23 DIAGNOSIS — R262 Difficulty in walking, not elsewhere classified: Secondary | ICD-10-CM

## 2021-06-23 DIAGNOSIS — C9000 Multiple myeloma not having achieved remission: Secondary | ICD-10-CM | POA: Diagnosis not present

## 2021-06-23 NOTE — Therapy (Signed)
[facility] @ [address] [place] [place], [place], [address] Phone: [phone]   Fax:  [phone]  Physical Therapy Treatment  Patient Details  Name: Hannah [name] MRN: [id number] Date of Birth: 10-13-1960 Referring Provider (PT): Cira Rue   Encounter Date: 06/23/2021   PT End of Session - 06/23/21 0811     Visit Number 42    Number of Visits 50    Date for PT Re-Evaluation 07/13/21    PT Start Time 0810    PT Stop Time 0845    PT Time Calculation (min) 35 min    Activity Tolerance Patient tolerated treatment well    Behavior During Therapy [facility] for tasks assessed/performed             Past Medical History:  Diagnosis Date   Allergy    Anemia    Anxiety    Arthritis    Asthma    Depression    Hypertension     Past Surgical History:  Procedure Laterality Date   CHOLECYSTECTOMY  1993   COLONOSCOPY     TOTAL KNEE ARTHROPLASTY Right 01/28/2016   TOTAL KNEE ARTHROPLASTY Right 01/28/2016   Procedure: RIGHT TOTAL KNEE ARTHROPLASTY;  Surgeon: [name], MD;  Location: [facility];  Service: Orthopedics;  Laterality: Right;   TRIGGER FINGER RELEASE Right 06/18/2014   Procedure: RIGHT LONG FINGER TRIGGER RELEASE;  Surgeon: [name], MD;  Location: [facility];  Service: Orthopedics;  Laterality: Right;   TUBAL LIGATION     VAGINAL HYSTERECTOMY Bilateral 11/04/2016   Procedure: HYSTERECTOMY VAGINAL with Bilateral Salpingectomy;  Surgeon: [name], MD;  Location: [facility];  Service: Gynecology;  Laterality: Bilateral;   WEIL OSTEOTOMY Right 07/07/2020   Procedure: RIGHT FOOT WEIL OSTEOTOMY 2, 3, AND 4 METATARSALS AND PROXIMAL INTERPHALANGEAL JOINT FUSION 2 & 4 TOES;  Surgeon: Newt Minion, MD;  Location: [facility];  Service: Orthopedics;  Laterality: Right;    There were no vitals filed for this visit.   Subjective Assessment - 06/23/21 0901     Subjective I am very tired  this AM, not sure why. i did receive 5 immunizations yesterday and I think this is why.    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 She as diagnosed in February 2022 with Multiple Myeloma not in remission. 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 --   My shoulders are achey, i think from the shots   Pain Onset More than a month ago             Treatment: Treatment took place seated in the facilities hot tub as the therapeutic pool was too cold for patients liking this AM. Pt was seated and monitored throughout her exercises. Pt was also given water throughout. Hot tub temp 102 degrees F.   TE: with hand paddles: 20x shoulder flex/ext, horizontal abd add 20x: rest in between  3# LAQ 20x Bil, hip ER stretches 3x Bil seated VC for breathing. Soft tissue mobilization utilizing the jetsx 5 min to pt's mid and upper back.                                 PT Long Term Goals - 05/18/21 6629  PT LONG TERM GOAL #1   Title Pt will return to the gym    Baseline not yet, but improving and concerned about covid/fluid    Time 8    Period Weeks    Status On-going    Target Date 07/13/21      PT LONG TERM GOAL #2   Title Pt will improve 79mn walk test to at least 10089fto demonstrate decreased fall risk    Baseline 643, 1061 today    Time 8    Period Weeks    Status Achieved    Target Date 05/18/21      PT LONG TERM GOAL #3   Title Pt will be able to perform a 5 x sts in 20 sec or less showing improved LE strength and    Baseline 18:40    Time 8    Period Weeks    Status Achieved    Target Date 05/18/21      PT LONG TERM GOAL #4   Title Pt will be able to go up and down stairs at home    Time 8    Period Weeks    Status Achieved      PT LONG TERM GOAL #5   Title Able to hold tandem stance and SLS for 10 seconds.     Baseline tandem today met with left foot forward, not met right forward,  SLS only held 3-5 sec    Time 8    Period Weeks    Status On-going    Target Date 07/13/21      Additional Long Term Goals   Additional Long Term Goals Yes      PT LONG TERM GOAL #6   Title Able to walk 1250 ft in 6 min to demonstrate improved gait speed    Time 8    Period Weeks    Status New    Target Date 07/13/21                   Plan - 06/23/21 0905     Clinical Impression Statement Pt arrives with fatigue and achey shoulders. She received 5 shots in her shoulder the other day and feel this is why she may feel like this. Pt attempted to get into th etherapeutic pool but the temperature was too low for her body today. She was able to get into the hot tub that was warmer and perform some shoulder and quad exercises. Pt went at slow pace due to increased temps of hot tube, was given water and montiored throughout for any adverse effects. Pt was montiored post exercise for about 30 min to make sure she was ok to leave the facility.    Personal Factors and Comorbidities Comorbidity 3+    Comorbidities Multiple Myeloma, multi joint OA, recent foot surgery, fall risk,SCT    Examination-Activity Limitations Sleep;Squat;Stand;Stairs;Transfers;Locomotion Level;Sit    Stability/Clinical Decision Making Stable/Uncomplicated    PT Frequency 2x / week    PT Duration 8 weeks    PT Treatment/Interventions ADLs/Self Care Home Management;Aquatic Therapy;Gait training;Stair training;Functional mobility training;Therapeutic activities;Therapeutic exercise;Balance training;Neuromuscular re-education;Manual techniques;Patient/family education;Passive range of motion;Joint Manipulations    PT Next Visit Plan STM left LB and gluts, core strength/stabs, endurance    Consulted and Agree with Plan of Care Patient             Patient will benefit from skilled therapeutic intervention in order to improve the following  deficits and impairments:  Visit Diagnosis: Muscle weakness (generalized)  Difficulty in walking, not elsewhere classified     Problem List Patient Active Problem List   Diagnosis Date Noted   Multiple myeloma ([facility]) 07/13/2020   Claw toe, right    Trigger finger, left middle finger 04/09/2020   Stenosing tenosynovitis of finger of left hand 02/27/2020   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   Major depression, recurrent ([facility]) 01/12/2018   Chronic right shoulder pain 12/20/2017   MDD (major depressive disorder), recurrent episode, moderate ([facility]) 11/04/2017   MGUS (monoclonal gammopathy of unknown significance) 10/24/2017   Primary osteoarthritis of both hands 09/26/2017   Primary osteoarthritis of right shoulder 09/26/2017   Primary osteoarthritis of both hips 09/26/2017   Status post total knee replacement, right 09/26/2017   Primary osteoarthritis of both feet 09/26/2017   History of asthma 09/26/2017   Primary osteoarthritis of left knee 04/18/2017   Menorrhagia 11/04/2016   Fibroid tumor 11/04/2016   Adenomyosis 11/04/2016   Hypertension, essential 11/04/2016   Total knee replacement status 01/28/2016    Tiyon Sanor, PTA 06/23/2021, 9:46 AM  [facility] @ [address] [place], [place], [address] Phone: [phone]   Fax:  [phone]  Name: MYCA PERNO MRN: [id number] Date of Birth: 10/25/1960

## 2021-06-28 ENCOUNTER — Ambulatory Visit: Payer: BC Managed Care – PPO

## 2021-06-29 ENCOUNTER — Other Ambulatory Visit: Payer: Self-pay

## 2021-06-30 ENCOUNTER — Ambulatory Visit: Payer: BC Managed Care – PPO | Admitting: Physical Therapy

## 2021-07-05 ENCOUNTER — Ambulatory Visit: Payer: BC Managed Care – PPO | Attending: Family

## 2021-07-05 ENCOUNTER — Other Ambulatory Visit: Payer: Self-pay

## 2021-07-05 DIAGNOSIS — R262 Difficulty in walking, not elsewhere classified: Secondary | ICD-10-CM | POA: Diagnosis not present

## 2021-07-05 DIAGNOSIS — M6281 Muscle weakness (generalized): Secondary | ICD-10-CM | POA: Insufficient documentation

## 2021-07-05 DIAGNOSIS — C9000 Multiple myeloma not having achieved remission: Secondary | ICD-10-CM | POA: Diagnosis not present

## 2021-07-05 DIAGNOSIS — R600 Localized edema: Secondary | ICD-10-CM | POA: Diagnosis not present

## 2021-07-05 DIAGNOSIS — R293 Abnormal posture: Secondary | ICD-10-CM | POA: Insufficient documentation

## 2021-07-05 DIAGNOSIS — I890 Lymphedema, not elsewhere classified: Secondary | ICD-10-CM | POA: Insufficient documentation

## 2021-07-05 DIAGNOSIS — R5383 Other fatigue: Secondary | ICD-10-CM | POA: Insufficient documentation

## 2021-07-05 DIAGNOSIS — Z483 Aftercare following surgery for neoplasm: Secondary | ICD-10-CM | POA: Insufficient documentation

## 2021-07-05 NOTE — Therapy (Signed)
[facility] @ [address] [place], [place], [address] Phone: [phone]   Fax:  [phone]  Physical Therapy Treatment  Patient Details  Name: Hannah [name] MRN: [id number] Date of Birth: 1960-11-12 Referring Provider (PT): Cira Rue   Encounter Date: 07/05/2021   PT End of Session - 07/05/21 1037     Visit Number 43    Number of Visits 50    Date for PT Re-Evaluation 07/13/21    PT Start Time 1004    PT Stop Time 1050    PT Time Calculation (min) 46 min    Activity Tolerance Patient tolerated treatment well    Behavior During Therapy [facility] for tasks assessed/performed             Past Medical History:  Diagnosis Date   Allergy    Anemia    Anxiety    Arthritis    Asthma    Depression    Hypertension     Past Surgical History:  Procedure Laterality Date   CHOLECYSTECTOMY  1993   COLONOSCOPY     TOTAL KNEE ARTHROPLASTY Right 01/28/2016   TOTAL KNEE ARTHROPLASTY Right 01/28/2016   Procedure: RIGHT TOTAL KNEE ARTHROPLASTY;  Surgeon: [name], MD;  Location: [facility];  Service: Orthopedics;  Laterality: Right;   TRIGGER FINGER RELEASE Right 06/18/2014   Procedure: RIGHT LONG FINGER TRIGGER RELEASE;  Surgeon: [name], MD;  Location: [facility];  Service: Orthopedics;  Laterality: Right;   TUBAL LIGATION     VAGINAL HYSTERECTOMY Bilateral 11/04/2016   Procedure: HYSTERECTOMY VAGINAL with Bilateral Salpingectomy;  Surgeon: [name], MD;  Location: [facility];  Service: Gynecology;  Laterality: Bilateral;   WEIL OSTEOTOMY Right 07/07/2020   Procedure: RIGHT FOOT WEIL OSTEOTOMY 2, 3, AND 4 METATARSALS AND PROXIMAL INTERPHALANGEAL JOINT FUSION 2 & 4 TOES;  Surgeon: Newt Minion, MD;  Location: [facility];  Service: Orthopedics;  Laterality: Right;    There were no vitals filed for this visit.   Subjective Assessment - 07/05/21 1004     Subjective I am still  fighting the body fatique and the rainy cold weather gets me too. The pool is going well except for the temperature of the water.    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 She as diagnosed in February 2022 with Multiple Myeloma not in remission. 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 no limits    How long can you stand comfortably? 14mn    How long can you walk comfortably? 540m    Diagnostic tests no scans scheduled recently    Patient Stated Goals get strong, build up the muscles    Currently in Pain? Yes    Pain Score 5     Pain Location Shoulder    Pain Descriptors / Indicators Aching    Pain Type Chronic pain    Pain Frequency Intermittent    Multiple Pain Sites No                               [facility] Adult PT Treatment/Exercise - 07/05/21 0001       Exercises   Other Exercises  Ny step seat 9, UE 9, lev 5 x  5 min, Tandem stance x 1 B, SLS x 2 B, sidestepping in parallel bars   6 laps, 6 in forward and lateral step ups 6 in x 10 ea, airex beam 6 lengths forward stepping and sideways, scapular retraction with yellow x 15, shoulder ext yellow x 15, LAQ 3# 2x10 B, supine ppt x 10, Marching with alternate hand to knee x 10 ea, SLR with ppt x 10, clam with ppt yellow x 15, Bilateral piriformis stretch 2 x 30 sec                          PT Long Term Goals - 05/18/21 3491       PT LONG TERM GOAL #1   Title Pt will return to the gym    Baseline not yet, but improving and concerned about covid/fluid    Time 8    Period Weeks    Status On-going    Target Date 07/13/21      PT LONG TERM GOAL #2   Title Pt will improve 69mn walk test to at least 10074fto demonstrate decreased fall risk    Baseline 643, 1061 today    Time 8    Period Weeks    Status Achieved    Target Date  05/18/21      PT LONG TERM GOAL #3   Title Pt will be able to perform a 5 x sts in 20 sec or less showing improved LE strength and    Baseline 18:40    Time 8    Period Weeks    Status Achieved    Target Date 05/18/21      PT LONG TERM GOAL #4   Title Pt will be able to go up and down stairs at home    Time 8    Period Weeks    Status Achieved      PT LONG TERM GOAL #5   Title Able to hold tandem stance and SLS for 10 seconds.    Baseline tandem today met with left foot forward, not met right forward,  SLS only held 3-5 sec    Time 8    Period Weeks    Status On-going    Target Date 07/13/21      Additional Long Term Goals   Additional Long Term Goals Yes      PT LONG TERM GOAL #6   Title Able to walk 1250 ft in 6 min to demonstrate improved gait speed    Time 8    Period Weeks    Status New    Target Date 07/13/21                   Plan - 07/05/21 1038     Clinical Impression Statement Pt was able to perform tandem stance bilaterally for 20-30seconds but was limited with SLS to 6-9 seconds.  She did exceptionally well with the airex beam with only 1 small LOB with forward walking and no LOB with sideways walking. Left hip still much more restricted than right. Pt was able to tolerate more standing exercises today with less rest.She felt good after rx.    Personal Factors and Comorbidities Comorbidity 3+    Comorbidities Multiple Myeloma, multi joint OA, recent foot surgery, fall risk,SCT    Examination-Activity Limitations Sleep;Squat;Stand;Stairs;Transfers;Locomotion Level;Sit    Examination-Participation Restrictions Cleaning;Community Activity;Occupation;Shop    Stability/Clinical Decision Making Stable/Uncomplicated    Rehab Potential Good  PT Frequency 2x / week    PT Duration 8 weeks    PT Treatment/Interventions ADLs/Self Care Home Management;Aquatic Therapy;Gait training;Stair training;Functional mobility training;Therapeutic activities;Therapeutic  exercise;Balance training;Neuromuscular re-education;Manual techniques;Patient/family education;Passive range of motion;Joint Manipulations    PT Next Visit Plan STM left LB and gluts, core strength/stabs, endurance    PT Home Exercise Plan mini squats, sidestepping, step ups,stretches,ppt,    Consulted and Agree with Plan of Care Patient             Patient will benefit from skilled therapeutic intervention in order to improve the following deficits and impairments:  Abnormal gait, Decreased mobility, Decreased activity tolerance, Decreased endurance, Decreased strength, Decreased balance, Decreased knowledge of precautions, Difficulty walking, Postural dysfunction, Pain  Visit Diagnosis: Muscle weakness (generalized)  Difficulty in walking, not elsewhere classified  Multiple myeloma not having achieved remission (HCC)  Fatigue, unspecified type     Problem List Patient Active Problem List   Diagnosis Date Noted   Multiple myeloma ([facility]) 07/13/2020   Claw toe, right    Trigger finger, left middle finger 04/09/2020   Stenosing tenosynovitis of finger of left hand 02/27/2020   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   Major depression, recurrent ([facility]) 01/12/2018   Chronic right shoulder pain 12/20/2017   MDD (major depressive disorder), recurrent episode, moderate ([facility] [facility]) 11/04/2017   MGUS (monoclonal gammopathy of unknown significance) 10/24/2017   Primary osteoarthritis of both hands 09/26/2017   Primary osteoarthritis of right shoulder 09/26/2017   Primary osteoarthritis of both hips 09/26/2017   Status post total knee replacement, right 09/26/2017   Primary osteoarthritis of both feet 09/26/2017   History of asthma 09/26/2017   Primary osteoarthritis of left knee 04/18/2017   Menorrhagia 11/04/2016   Fibroid tumor 11/04/2016   Adenomyosis 11/04/2016   Hypertension, essential 11/04/2016   Total knee replacement status 01/28/2016    Claris Pong, PT 07/05/2021, 11:03  AM  [facility] @ [address] [place], [place], [address] Phone: [phone]   Fax:  [phone]  Name: Hannah [name] MRN: [id number] Date of Birth: Jan 18, 1961

## 2021-07-07 ENCOUNTER — Ambulatory Visit: Payer: BC Managed Care – PPO | Admitting: Physical Therapy

## 2021-07-07 ENCOUNTER — Encounter: Payer: Self-pay | Admitting: Physical Therapy

## 2021-07-07 ENCOUNTER — Other Ambulatory Visit: Payer: Self-pay

## 2021-07-07 DIAGNOSIS — M6281 Muscle weakness (generalized): Secondary | ICD-10-CM | POA: Diagnosis not present

## 2021-07-07 DIAGNOSIS — R293 Abnormal posture: Secondary | ICD-10-CM | POA: Diagnosis not present

## 2021-07-07 DIAGNOSIS — C9000 Multiple myeloma not having achieved remission: Secondary | ICD-10-CM | POA: Diagnosis not present

## 2021-07-07 DIAGNOSIS — I890 Lymphedema, not elsewhere classified: Secondary | ICD-10-CM | POA: Diagnosis not present

## 2021-07-07 DIAGNOSIS — R600 Localized edema: Secondary | ICD-10-CM | POA: Diagnosis not present

## 2021-07-07 DIAGNOSIS — R5383 Other fatigue: Secondary | ICD-10-CM | POA: Diagnosis not present

## 2021-07-07 DIAGNOSIS — R262 Difficulty in walking, not elsewhere classified: Secondary | ICD-10-CM | POA: Diagnosis not present

## 2021-07-07 DIAGNOSIS — Z483 Aftercare following surgery for neoplasm: Secondary | ICD-10-CM | POA: Diagnosis not present

## 2021-07-07 NOTE — Therapy (Signed)
[facility] @ [place], [place], [address] Phone: [phone]   Fax:  [phone]  Physical Therapy Treatment  Patient Details  Name: Hannah [name] MRN: [id number] Date of Birth: 1960-11-15 Referring Provider (PT): Cira Rue   Encounter Date: 07/07/2021   PT End of Session - 07/07/21 1158     Visit Number 63    Number of Visits 50    Date for PT Re-Evaluation 07/13/21    PT Start Time 0846    PT Stop Time 0930    PT Time Calculation (min) 44 min    Activity Tolerance Patient tolerated treatment well    Behavior During Therapy [facility] for tasks assessed/performed             Past Medical History:  Diagnosis Date   Allergy    Anemia    Anxiety    Arthritis    Asthma    Depression    Hypertension     Past Surgical History:  Procedure Laterality Date   CHOLECYSTECTOMY  1993   COLONOSCOPY     TOTAL KNEE ARTHROPLASTY Right 01/28/2016   TOTAL KNEE ARTHROPLASTY Right 01/28/2016   Procedure: RIGHT TOTAL KNEE ARTHROPLASTY;  Surgeon: [name], MD;  Location: [facility];  Service: Orthopedics;  Laterality: Right;   TRIGGER FINGER RELEASE Right 06/18/2014   Procedure: RIGHT LONG FINGER TRIGGER RELEASE;  Surgeon: [name], MD;  Location: [facility];  Service: Orthopedics;  Laterality: Right;   TUBAL LIGATION     VAGINAL HYSTERECTOMY Bilateral 11/04/2016   Procedure: HYSTERECTOMY VAGINAL with Bilateral Salpingectomy;  Surgeon: [name], MD;  Location: [facility];  Service: Gynecology;  Laterality: Bilateral;   WEIL OSTEOTOMY Right 07/07/2020   Procedure: RIGHT FOOT WEIL OSTEOTOMY 2, 3, AND 4 METATARSALS AND PROXIMAL INTERPHALANGEAL JOINT FUSION 2 & 4 TOES;  Surgeon: Newt Minion, MD;  Location: [facility];  Service: Orthopedics;  Laterality: Right;    There were no vitals filed for this visit.  Treatment: Pt arrives for aquatic physical therapy. Treatment took place  in 3.5-5.5 feet of water. Water temperature was 86 degrees F. Pt entered the pool via stairs with mild use of UE, pt slow to adapt to cooler temps of the water. Pt requires buoyancy of water for support and to offload joints with strengthening exercises.    50% depth: Water walking slower pace secondary pt just coming off a sickness but still able to complete 10x of each direction. High knee marching with neck pillow compression for core activation 10x, wall exs inc hip 3 ways 15 each. Single buoy UE water weights 2 min horizontal abd/add, Slow underwater bicycle with yellow noodle behind pt x 5 min, pt did well with slower pace.                                    PT Long Term Goals - 05/18/21 8144       PT LONG TERM GOAL #1   Title Pt will return to the gym    Baseline not yet, but improving and concerned about covid/fluid    Time 8    Period Weeks    Status On-going    Target Date 07/13/21      PT LONG TERM GOAL #2   Title Pt will improve 32mn walk test to at least 10081fto demonstrate  decreased fall risk    Baseline 643, 1061 today    Time 8    Period Weeks    Status Achieved    Target Date 05/18/21      PT LONG TERM GOAL #3   Title Pt will be able to perform a 5 x sts in 20 sec or less showing improved LE strength and    Baseline 18:40    Time 8    Period Weeks    Status Achieved    Target Date 05/18/21      PT LONG TERM GOAL #4   Title Pt will be able to go up and down stairs at home    Time 8    Period Weeks    Status Achieved      PT LONG TERM GOAL #5   Title Able to hold tandem stance and SLS for 10 seconds.    Baseline tandem today met with left foot forward, not met right forward,  SLS only held 3-5 sec    Time 8    Period Weeks    Status On-going    Target Date 07/13/21      Additional Long Term Goals   Additional Long Term Goals Yes      PT LONG TERM GOAL #6   Title Able to walk 1250 ft in 6 min to demonstrate improved gait  speed    Time 8    Period Weeks    Status New    Target Date 07/13/21                    Patient will benefit from skilled therapeutic intervention in order to improve the following deficits and impairments:     Visit Diagnosis: Muscle weakness (generalized)  Difficulty in walking, not elsewhere classified  Multiple myeloma not having achieved remission (HCC)  Fatigue, unspecified type     Problem List Patient Active Problem List   Diagnosis Date Noted   Multiple myeloma ([place]) 07/13/2020   Claw toe, right    Trigger finger, left middle finger 04/09/2020   Stenosing tenosynovitis of finger of left hand 02/27/2020   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   Major depression, recurrent ([place]) 01/12/2018   Chronic right shoulder pain 12/20/2017   MDD (major depressive disorder), recurrent episode, moderate ([place]) 11/04/2017   MGUS (monoclonal gammopathy of unknown significance) 10/24/2017   Primary osteoarthritis of both hands 09/26/2017   Primary osteoarthritis of right shoulder 09/26/2017   Primary osteoarthritis of both hips 09/26/2017   Status post total knee replacement, right 09/26/2017   Primary osteoarthritis of both feet 09/26/2017   History of asthma 09/26/2017   Primary osteoarthritis of left knee 04/18/2017   Menorrhagia 11/04/2016   Fibroid tumor 11/04/2016   Adenomyosis 11/04/2016   Hypertension, essential 11/04/2016   Total knee replacement status 01/28/2016    Chanin Frumkin, PTA 07/07/2021, 11:59 AM  [facility] @ [place], [place], [address] Phone: [phone]   Fax:  [phone]  Name: Hannah [name] MRN: [id number] Date of Birth: May 12, 1961

## 2021-07-12 ENCOUNTER — Ambulatory Visit: Payer: BC Managed Care – PPO

## 2021-07-12 ENCOUNTER — Other Ambulatory Visit: Payer: Self-pay

## 2021-07-12 DIAGNOSIS — R293 Abnormal posture: Secondary | ICD-10-CM

## 2021-07-12 DIAGNOSIS — Z483 Aftercare following surgery for neoplasm: Secondary | ICD-10-CM

## 2021-07-12 DIAGNOSIS — C9000 Multiple myeloma not having achieved remission: Secondary | ICD-10-CM | POA: Diagnosis not present

## 2021-07-12 DIAGNOSIS — M6281 Muscle weakness (generalized): Secondary | ICD-10-CM | POA: Diagnosis not present

## 2021-07-12 DIAGNOSIS — I890 Lymphedema, not elsewhere classified: Secondary | ICD-10-CM

## 2021-07-12 DIAGNOSIS — R600 Localized edema: Secondary | ICD-10-CM | POA: Diagnosis not present

## 2021-07-12 DIAGNOSIS — R5383 Other fatigue: Secondary | ICD-10-CM | POA: Diagnosis not present

## 2021-07-12 DIAGNOSIS — R262 Difficulty in walking, not elsewhere classified: Secondary | ICD-10-CM | POA: Diagnosis not present

## 2021-07-12 NOTE — Addendum Note (Signed)
Addended by: Claris Pong on: 07/12/2021 10:14 AM   Modules accepted: Orders

## 2021-07-12 NOTE — Therapy (Addendum)
[facility] @ [place], [place], [address] Phone: [phone]   Fax:  [phone]  Physical Therapy Treatment  Patient Details  Name: Hannah [name] MRN: [id number] Date of Birth: 20-Nov-1960 Referring Provider (PT): Cira Rue   Encounter Date: 07/12/2021   PT End of Session - 07/12/21 0854     Visit Number 55    Number of Visits 73    Date for PT Re-Evaluation 09/06/21    PT Start Time 0804    PT Stop Time 0845    PT Time Calculation (min) 41 min             Past Medical History:  Diagnosis Date   Allergy    Anemia    Anxiety    Arthritis    Asthma    Depression    Hypertension     Past Surgical History:  Procedure Laterality Date   CHOLECYSTECTOMY  1993   COLONOSCOPY     TOTAL KNEE ARTHROPLASTY Right 01/28/2016   TOTAL KNEE ARTHROPLASTY Right 01/28/2016   Procedure: RIGHT TOTAL KNEE ARTHROPLASTY;  Surgeon: [name], MD;  Location: [facility];  Service: Orthopedics;  Laterality: Right;   TRIGGER FINGER RELEASE Right 06/18/2014   Procedure: RIGHT LONG FINGER TRIGGER RELEASE;  Surgeon: [name], MD;  Location: [facility];  Service: Orthopedics;  Laterality: Right;   TUBAL LIGATION     VAGINAL HYSTERECTOMY Bilateral 11/04/2016   Procedure: HYSTERECTOMY VAGINAL with Bilateral Salpingectomy;  Surgeon: [name], MD;  Location: [facility];  Service: Gynecology;  Laterality: Bilateral;   WEIL OSTEOTOMY Right 07/07/2020   Procedure: RIGHT FOOT WEIL OSTEOTOMY 2, 3, AND 4 METATARSALS AND PROXIMAL INTERPHALANGEAL JOINT FUSION 2 & 4 TOES;  Surgeon: Newt Minion, MD;  Location: [facility];  Service: Orthopedics;  Laterality: Right;    There were no vitals filed for this visit.   Subjective Assessment - 07/12/21 0803     Subjective Feel pretty good. Starting to get some energy back from when she was sick.  I am feeling stronger than I was .I can do more at home  like cleaning, sweeping, mopping with fewer rest breaks.  My right shoulder is bothering me more now.  I can't do as much because my arm and shoulder feel weak, and my hand feels weak.  My LB is weaker too and I get really tired with movement. I get back pain if I sit too long or stand too long.  I think my core is not as strong as it needs to be.    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 She as diagnosed in February 2022 with Multiple Myeloma not in remission. 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 no limits    How long can you walk comfortably? 25mn    Diagnostic tests no scans scheduled recently    Patient Stated Goals get strong, build up the muscles    Currently in Pain? Yes    Pain Score 3    with Aleve   Pain Location Shoulder    Pain Orientation Right    Pain Descriptors / Indicators Aching;Throbbing    Pain Type Chronic pain    Pain Onset More than a month ago    Pain Frequency  Constant   but changes in intensity   Aggravating Factors  using Rght  arm alot    Pain Relieving Factors aleve    Effect of Pain on Daily Activities limits daily activities, reaching, mopping etc    Multiple Pain Sites No                [facility] PT Assessment - 07/12/21 0001       AROM   Right Shoulder Flexion 70 Degrees   limited by pain   Right Shoulder ABduction 60 Degrees   in slight scaption limited by pain   Left Shoulder Flexion 150 Degrees    Left Shoulder ABduction 170 Degrees      Strength   Right Hand Grip (lbs) 24    Left Hand Grip (lbs) 25      6 minute walk test results    Aerobic Endurance Distance Walked --   772 feet in 4:30 sec     Balance   Balance Assessed --   tandem stance 35 sec each side;stopped by PT at that point,, SLS right10 sec, left 22 sec                                         PT Long Term Goals - 07/12/21 0850       PT LONG TERM GOAL #1   Title Pt will return to the gym    Baseline not yet, but improving and concerned about covid/fluid    Status On-going      PT LONG TERM GOAL #2   Title Pt will improve 70mn walk test to at least 10076fto demonstrate decreased fall risk    Baseline 643, 1061 today    Time 8    Period Weeks    Status Achieved    Target Date 05/18/21      PT LONG TERM GOAL #3   Title Pt will be able to perform a 5 x sts in 20 sec or less showing improved LE strength and    Time 8    Period Weeks    Status Achieved    Target Date 05/18/21      PT LONG TERM GOAL #4   Title Pt will be able to go up and down stairs at home    Time 8    Period Weeks    Status Achieved    Target Date 12/14/20      PT LONG TERM GOAL #5   Title Able to hold tandem stance and SLS for 10 seconds.    Baseline 35 sec tandem each side stopped by PT, Right SLS 10, left 22    Time 8    Period Weeks    Status Achieved    Target Date 07/12/21      PT LONG TERM GOAL #6   Title Able to walk 1250 ft in 6 min to demonstrate improved gait speed    Baseline walked 772 feet but had to stop early secondary to weakness in legs    Period Weeks    Status On-going    Target Date 09/06/21      PT LONG TERM GOAL #7   Title Pt will improve right shoulder ROM for flexion and abd to atleast 110 degrees for improved ability to reach.    Time 8    Period Weeks    Status New  Target Date 09/06/21      PT LONG TERM GOAL #8   Title Pt will improve bilateral grip strength to atleast 30 # for improved function    Time 8    Period Weeks    Status New    Target Date 09/06/21                   Plan - 07/12/21 0850     Clinical Impression Statement Pt was reassessed today for recertification. Started with 6 min walk test however, pt was unable to complete secondary to weakness in her legs and  required rest at 4:30 seconds.  She was sick the week before last and this may have affected her endurance.Checked balance activities and pt did meet her goal for being able to maintain tandem stance for 10 sec.  She held each side for 35 seconds before she was stopped by PT. SLS goal was achieved for each with right being held 10 seconds and left 22 seconds.  Shoulder ROM was assessed today as pt has difficulty reaching with the right dominant shoulder.  She does have OA in the shoulder and is making an appt to see her orthopedic MD.  Grip strength is also weak bilaterally and a new goal has been made to address this. We will carry over 6 min walk test goal.    Personal Factors and Comorbidities Comorbidity 3+    Comorbidities Multiple Myeloma, multi joint OA, recent foot surgery, fall risk,SCT    Examination-Activity Limitations Sleep;Squat;Stand;Stairs;Transfers;Locomotion Level;Sit    Examination-Participation Restrictions Cleaning;Community Activity;Occupation;Shop    Stability/Clinical Decision Making Stable/Uncomplicated    Clinical Decision Making Low    Rehab Potential Good    PT Frequency 3x / week    PT Duration 8 weeks    PT Treatment/Interventions ADLs/Self Care Home Management;Aquatic Therapy;Gait training;Stair training;Functional mobility training;Therapeutic activities;Therapeutic exercise;Balance training;Neuromuscular re-education;Manual techniques;Patient/family education;Passive range of motion;Joint Manipulations    PT Next Visit Plan continue aquatic therapy, stab exs, shoulder ROM, add grip strength, endurance    PT Home Exercise Plan mini squats, sidestepping, step ups,stretches,ppt,    Consulted and Agree with Plan of Care Patient             Patient will benefit from skilled therapeutic intervention in order to improve the following deficits and impairments:  Abnormal gait, Decreased mobility, Decreased activity tolerance, Decreased endurance, Decreased strength,  Decreased balance, Decreased knowledge of precautions, Difficulty walking, Postural dysfunction, Pain  Visit Diagnosis: Abnormal posture  Aftercare following surgery for neoplasm  Localized edema  Lymphedema, not elsewhere classified  Muscle weakness (generalized)     Problem List Patient Active Problem List   Diagnosis Date Noted   Multiple myeloma ([place]) 07/13/2020   Claw toe, right    Trigger finger, left middle finger 04/09/2020   Stenosing tenosynovitis of finger of left hand 02/27/2020   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   Major depression, recurrent ([place]) 01/12/2018   Chronic right shoulder pain 12/20/2017   MDD (major depressive disorder), recurrent episode, moderate ([place]) 11/04/2017   MGUS (monoclonal gammopathy of unknown significance) 10/24/2017   Primary osteoarthritis of both hands 09/26/2017   Primary osteoarthritis of right shoulder 09/26/2017   Primary osteoarthritis of both hips 09/26/2017   Status post total knee replacement, right 09/26/2017   Primary osteoarthritis of both feet 09/26/2017   History of asthma 09/26/2017   Primary osteoarthritis of left knee 04/18/2017   Menorrhagia 11/04/2016   Fibroid tumor 11/04/2016   Adenomyosis 11/04/2016  Hypertension, essential 11/04/2016   Total knee replacement status 01/28/2016    Claris Pong, PT 07/12/2021, 10:09 AM  [facility] @ [place], [place], [address] Phone: [phone]   Fax:  [phone]  Name: Hannah [name] MRN: [id number] Date of Birth: 07-31-1960

## 2021-07-19 ENCOUNTER — Other Ambulatory Visit: Payer: Self-pay

## 2021-07-19 ENCOUNTER — Ambulatory Visit: Payer: BC Managed Care – PPO

## 2021-07-19 DIAGNOSIS — R293 Abnormal posture: Secondary | ICD-10-CM

## 2021-07-19 DIAGNOSIS — I890 Lymphedema, not elsewhere classified: Secondary | ICD-10-CM | POA: Diagnosis not present

## 2021-07-19 DIAGNOSIS — M6281 Muscle weakness (generalized): Secondary | ICD-10-CM

## 2021-07-19 DIAGNOSIS — C9000 Multiple myeloma not having achieved remission: Secondary | ICD-10-CM | POA: Diagnosis not present

## 2021-07-19 DIAGNOSIS — R262 Difficulty in walking, not elsewhere classified: Secondary | ICD-10-CM | POA: Diagnosis not present

## 2021-07-19 DIAGNOSIS — Z483 Aftercare following surgery for neoplasm: Secondary | ICD-10-CM

## 2021-07-19 DIAGNOSIS — R5383 Other fatigue: Secondary | ICD-10-CM | POA: Diagnosis not present

## 2021-07-19 DIAGNOSIS — R600 Localized edema: Secondary | ICD-10-CM | POA: Diagnosis not present

## 2021-07-19 NOTE — Therapy (Signed)
[facility] @ [place], [place], [address] Phone: [phone]   Fax:  [phone]  Physical Therapy Treatment  Patient Details  Name: Hannah [name] MRN: [id number] Date of Birth: [birth year] Referring Provider (PT): Cira Rue   Encounter Date: 07/19/2021   PT End of Session - 07/19/21 0817     Visit Number 75    Number of Visits 55    Date for PT Re-Evaluation 09/06/21    Authorization Type [employer]    PT Start Time 0809    PT Stop Time 0855    PT Time Calculation (min) 46 min    Activity Tolerance Patient tolerated treatment well    Behavior During Therapy [facility] for tasks assessed/performed             Past Medical History:  Diagnosis Date   Allergy    Anemia    Anxiety    Arthritis    Asthma    Depression    Hypertension     Past Surgical History:  Procedure Laterality Date   CHOLECYSTECTOMY  1993   COLONOSCOPY     TOTAL KNEE ARTHROPLASTY Right 01/28/2016   TOTAL KNEE ARTHROPLASTY Right 01/28/2016   Procedure: RIGHT TOTAL KNEE ARTHROPLASTY;  Surgeon: [name], MD;  Location: [facility];  Service: Orthopedics;  Laterality: Right;   TRIGGER FINGER RELEASE Right 06/18/2014   Procedure: RIGHT LONG FINGER TRIGGER RELEASE;  Surgeon: [name], MD;  Location: [facility];  Service: Orthopedics;  Laterality: Right;   TUBAL LIGATION     VAGINAL HYSTERECTOMY Bilateral 11/04/2016   Procedure: HYSTERECTOMY VAGINAL with Bilateral Salpingectomy;  Surgeon: [name], MD;  Location: [facility];  Service: Gynecology;  Laterality: Bilateral;   WEIL OSTEOTOMY Right 07/07/2020   Procedure: RIGHT FOOT WEIL OSTEOTOMY 2, 3, AND 4 METATARSALS AND PROXIMAL INTERPHALANGEAL JOINT FUSION 2 & 4 TOES;  Surgeon: Newt Minion, MD;  Location: [facility];  Service: Orthopedics;  Laterality: Right;    There were no vitals filed for this visit.   Subjective Assessment - 07/19/21 0812      Subjective I feel OK today but my shoulder always wakes up early. Starting to feel better from where I was sick.  I don't take as many naps as I did.  Still take rest breask when cleaning the house. Back hurts with bending to get into dryer, or to carry things.    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 She as diagnosed in February 2022 with Multiple Myeloma not in remission. 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 no limits    How long can you stand comfortably? 22mn    How long can you walk comfortably? 546m    Diagnostic tests no scans scheduled recently    Patient Stated Goals get strong, build up the muscles    Currently in Pain? Yes    Pain Score 5     Pain Location Shoulder    Pain Orientation Right    Pain Descriptors / Indicators Throbbing;Aching    Pain Type Chronic pain    Pain Onset More than a month ago    Pain Frequency Constant    Multiple Pain Sites No  [facility] Adult PT Treatment/Exercise - 07/19/21 0001       Exercises   Other Exercises  Nus step 7 min at seat and UE 9, tandem stance x 1 B, 6 in partial step ups x 10 ea, AA shoulder flexion supine, standing lat stretch at bar, finger ladder x 3 pendulums,ppt x 10, ppt with opposite hand to knee 2 x10, ppt with SLR 2x5, ppt with SL lowering 2x 3. bridge x 10, LTR x5.                          PT Long Term Goals - 07/12/21 0850       PT LONG TERM GOAL #1   Title Pt will return to the gym    Baseline not yet, but improving and concerned about covid/fluid    Status On-going      PT LONG TERM GOAL #2   Title Pt will improve 25mn walk test to at least 10062fto demonstrate decreased fall risk    Baseline 643, 1061 today    Time 8    Period Weeks    Status Achieved    Target Date  05/18/21      PT LONG TERM GOAL #3   Title Pt will be able to perform a 5 x sts in 20 sec or less showing improved LE strength and    Time 8    Period Weeks    Status Achieved    Target Date 05/18/21      PT LONG TERM GOAL #4   Title Pt will be able to go up and down stairs at home    Time 8    Period Weeks    Status Achieved    Target Date 12/14/20      PT LONG TERM GOAL #5   Title Able to hold tandem stance and SLS for 10 seconds.    Baseline 35 sec tandem each side stopped by PT, Right SLS 10, left 22    Time 8    Period Weeks    Status Achieved    Target Date 07/12/21      PT LONG TERM GOAL #6   Title Able to walk 1250 ft in 6 min to demonstrate improved gait speed    Baseline walked 772 feet but had to stop early secondary to weakness in legs    Period Weeks    Status On-going    Target Date 09/06/21      PT LONG TERM GOAL #7   Title Pt will improve right shoulder ROM for flexion and abd to atleast 110 degrees for improved ability to reach.    Time 8    Period Weeks    Status New    Target Date 09/06/21      PT LONG TERM GOAL #8   Title Pt will improve bilateral grip strength to atleast 30 # for improved function    Time 8    Period Weeks    Status New    Target Date 09/06/21                   Plan - 07/19/21 0831     Clinical Impression Statement Pts shoulder very stiff and difficult to perform most exercises. Did fairly well with supine AA shoulder flexion. but had increased pain with pendulums down the arm and with finger ladder. Did well with lumbar exercises but fatigues. Demonstrated good balance with tandem stance. May  benefit next visit from manual work to shoulder prior to exercises    Personal Factors and Comorbidities Comorbidity 3+    Comorbidities Multiple Myeloma, multi joint OA, recent foot surgery, fall risk,SCT    Examination-Activity Limitations Sleep;Squat;Stand;Stairs;Transfers;Locomotion Level;Sit    Examination-Participation  Restrictions Cleaning;Community Activity;Occupation;Shop    Stability/Clinical Decision Making Stable/Uncomplicated    Rehab Potential Good    PT Frequency 3x / week    PT Duration 8 weeks    PT Treatment/Interventions ADLs/Self Care Home Management;Aquatic Therapy;Gait training;Stair training;Functional mobility training;Therapeutic activities;Therapeutic exercise;Balance training;Neuromuscular re-education;Manual techniques;Patient/family education;Passive range of motion;Joint Manipulations    PT Next Visit Plan STM to right shoulder,continue aquatic therapy, stab exs, shoulder ROM, add grip strength, endurance    PT Home Exercise Plan mini squats, sidestepping, step ups,stretches,ppt,    Consulted and Agree with Plan of Care Patient             Patient will benefit from skilled therapeutic intervention in order to improve the following deficits and impairments:  Abnormal gait, Decreased mobility, Decreased activity tolerance, Decreased endurance, Decreased strength, Decreased balance, Decreased knowledge of precautions, Difficulty walking, Postural dysfunction, Pain  Visit Diagnosis: Abnormal posture  Aftercare following surgery for neoplasm  Localized edema  Lymphedema, not elsewhere classified  Muscle weakness (generalized)     Problem List Patient Active Problem List   Diagnosis Date Noted   Multiple myeloma ([place]) 07/13/2020   Claw toe, right    Trigger finger, left middle finger 04/09/2020   Stenosing tenosynovitis of finger of left hand 02/27/2020   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   Major depression, recurrent ([place]) 01/12/2018   Chronic right shoulder pain 12/20/2017   MDD (major depressive disorder), recurrent episode, moderate ([place]) 11/04/2017   MGUS (monoclonal gammopathy of unknown significance) 10/24/2017   Primary osteoarthritis of both hands 09/26/2017   Primary osteoarthritis of right shoulder 09/26/2017   Primary osteoarthritis of both hips  09/26/2017   Status post total knee replacement, right 09/26/2017   Primary osteoarthritis of both feet 09/26/2017   History of asthma 09/26/2017   Primary osteoarthritis of left knee 04/18/2017   Menorrhagia 11/04/2016   Fibroid tumor 11/04/2016   Adenomyosis 11/04/2016   Hypertension, essential 11/04/2016   Total knee replacement status 01/28/2016    Claris Pong, PT 07/19/2021, 9:05 AM  [facility] @ [place], [place], [address] Phone: [phone]   Fax:  [phone]  Name: DEBORAHANN POTEAT MRN: [id number] Date of Birth: 03-25-1961

## 2021-07-21 ENCOUNTER — Other Ambulatory Visit: Payer: Self-pay

## 2021-07-21 ENCOUNTER — Ambulatory Visit: Payer: BC Managed Care – PPO | Admitting: Orthopedic Surgery

## 2021-07-21 DIAGNOSIS — C9000 Multiple myeloma not having achieved remission: Secondary | ICD-10-CM

## 2021-07-21 MED ORDER — LENALIDOMIDE 10 MG PO CAPS: 10.0000 mg | ORAL_CAPSULE | Freq: Every day | ORAL | 0 refills | Status: DC

## 2021-07-23 ENCOUNTER — Other Ambulatory Visit: Payer: Self-pay | Admitting: Hematology

## 2021-07-23 ENCOUNTER — Encounter: Payer: Self-pay | Admitting: Hematology

## 2021-07-23 ENCOUNTER — Other Ambulatory Visit: Payer: Self-pay

## 2021-07-23 ENCOUNTER — Inpatient Hospital Stay: Payer: BC Managed Care – PPO

## 2021-07-23 ENCOUNTER — Inpatient Hospital Stay: Payer: BC Managed Care – PPO | Attending: Hematology | Admitting: Hematology

## 2021-07-23 VITALS — BP 150/94 | HR 98 | Resp 17 | Wt 196.5 lb

## 2021-07-23 DIAGNOSIS — D509 Iron deficiency anemia, unspecified: Secondary | ICD-10-CM | POA: Insufficient documentation

## 2021-07-23 DIAGNOSIS — M1990 Unspecified osteoarthritis, unspecified site: Secondary | ICD-10-CM | POA: Insufficient documentation

## 2021-07-23 DIAGNOSIS — Z9049 Acquired absence of other specified parts of digestive tract: Secondary | ICD-10-CM | POA: Diagnosis not present

## 2021-07-23 DIAGNOSIS — R293 Abnormal posture: Secondary | ICD-10-CM | POA: Diagnosis present

## 2021-07-23 DIAGNOSIS — I10 Essential (primary) hypertension: Secondary | ICD-10-CM | POA: Diagnosis not present

## 2021-07-23 DIAGNOSIS — C9000 Multiple myeloma not having achieved remission: Secondary | ICD-10-CM

## 2021-07-23 DIAGNOSIS — Z79899 Other long term (current) drug therapy: Secondary | ICD-10-CM | POA: Insufficient documentation

## 2021-07-23 DIAGNOSIS — F32A Depression, unspecified: Secondary | ICD-10-CM | POA: Insufficient documentation

## 2021-07-23 DIAGNOSIS — Z7961 Long term (current) use of immunomodulator: Secondary | ICD-10-CM | POA: Diagnosis not present

## 2021-07-23 DIAGNOSIS — Z923 Personal history of irradiation: Secondary | ICD-10-CM | POA: Insufficient documentation

## 2021-07-23 DIAGNOSIS — Z9079 Acquired absence of other genital organ(s): Secondary | ICD-10-CM | POA: Insufficient documentation

## 2021-07-23 DIAGNOSIS — M25551 Pain in right hip: Secondary | ICD-10-CM | POA: Insufficient documentation

## 2021-07-23 DIAGNOSIS — M545 Low back pain, unspecified: Secondary | ICD-10-CM | POA: Diagnosis not present

## 2021-07-23 DIAGNOSIS — F419 Anxiety disorder, unspecified: Secondary | ICD-10-CM | POA: Diagnosis not present

## 2021-07-23 DIAGNOSIS — Z882 Allergy status to sulfonamides status: Secondary | ICD-10-CM | POA: Insufficient documentation

## 2021-07-23 DIAGNOSIS — Z9484 Stem cells transplant status: Secondary | ICD-10-CM | POA: Diagnosis not present

## 2021-07-23 DIAGNOSIS — E669 Obesity, unspecified: Secondary | ICD-10-CM | POA: Diagnosis not present

## 2021-07-23 LAB — CBC WITH DIFFERENTIAL (CANCER CENTER ONLY)
Abs Immature Granulocytes: 0.02 10*3/uL (ref 0.00–0.07)
Basophils Absolute: 0 10*3/uL (ref 0.0–0.1)
Basophils Relative: 1 %
Eosinophils Absolute: 0.2 10*3/uL (ref 0.0–0.5)
Eosinophils Relative: 4 %
HCT: 32 % — ABNORMAL LOW (ref 36.0–46.0)
Hemoglobin: 10.2 g/dL — ABNORMAL LOW (ref 12.0–15.0)
Immature Granulocytes: 0 %
Lymphocytes Relative: 44 %
Lymphs Abs: 2.4 10*3/uL (ref 0.7–4.0)
MCH: 23.1 pg — ABNORMAL LOW (ref 26.0–34.0)
MCHC: 31.9 g/dL (ref 30.0–36.0)
MCV: 72.4 fL — ABNORMAL LOW (ref 80.0–100.0)
Monocytes Absolute: 0.4 10*3/uL (ref 0.1–1.0)
Monocytes Relative: 8 %
Neutro Abs: 2.3 10*3/uL (ref 1.7–7.7)
Neutrophils Relative %: 43 %
Platelet Count: 219 10*3/uL (ref 150–400)
RBC: 4.42 MIL/uL (ref 3.87–5.11)
RDW: 17.7 % — ABNORMAL HIGH (ref 11.5–15.5)
WBC Count: 5.4 10*3/uL (ref 4.0–10.5)
nRBC: 0 % (ref 0.0–0.2)

## 2021-07-23 LAB — CMP (CANCER CENTER ONLY)
ALT: 10 U/L (ref 0–44)
AST: 13 U/L — ABNORMAL LOW (ref 15–41)
Albumin: 3.8 g/dL (ref 3.5–5.0)
Alkaline Phosphatase: 77 U/L (ref 38–126)
Anion gap: 4 — ABNORMAL LOW (ref 5–15)
BUN: 17 mg/dL (ref 6–20)
CO2: 31 mmol/L (ref 22–32)
Calcium: 8.9 mg/dL (ref 8.9–10.3)
Chloride: 107 mmol/L (ref 98–111)
Creatinine: 0.71 mg/dL (ref 0.44–1.00)
GFR, Estimated: >60 mL/min (ref >60)
Glucose, Bld: 78 mg/dL (ref 70–99)
Potassium: 3.7 mmol/L (ref 3.5–5.1)
Sodium: 142 mmol/L (ref 135–145)
Total Bilirubin: 0.3 mg/dL (ref 0.3–1.2)
Total Protein: 7.1 g/dL (ref 6.5–8.1)

## 2021-07-23 NOTE — Progress Notes (Signed)
Met with patient at registration area to complete grant paperwork.  Patient approved for one-time $1000 [employer] grant to assist with personal expenses while going through treatment. Discussed in detail expenses and how they are covered. She has a copy of the approval letter and expense sheet along with the Outpatient pharmacy information. She received a gift card today from her grant.  She has paperwork and my card in a green folder for any additional financial questions or concerns.

## 2021-07-23 NOTE — Progress Notes (Signed)
[facility]   [phone] [phone]   Clinic Follow up Note   Patient Care Team: Tisovec, Fransico Him, MD as PCP - General (Internal Medicine) Bo Merino, MD as Consulting Physician (Rheumatology) Truitt Merle, MD as Consulting Physician (Hematology) Waymon Amato, MD as Consulting Physician (Obstetrics and Gynecology)  Date of Service:  07/23/2021  CHIEF COMPLAINT: f/u of IgA kappa multiple myeloma  CURRENT THERAPY:  -Maintenance Revlimid 10 mg, days 1-21 q. 28 days started 05/03/21 -Zometa q32month, restarted 05/28/21  ASSESSMENT & PLAN:  Hannah GERHARTis a 61y.o. female with   1. 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   -She underwent staging bone marrow biopsy on 07/30/20 which showed slightly hypercellular marrow for age and 31% plasma cells.  Cytogenetics and FISH were unremarkable -Due to mobility issues and pain, she completed palliative RT to pelvis, scapula/left chest, T4-6 and T10-12 by Dr. MLisbeth Renshawfrom 07/30/20 - 08/12/20 -she started VRD (weekly Velcade and dexamethasone, Revlimid 2 weeks on 1 week off) on 08/17/20, IgA and light chains normalized and M protein decreased to 0.3 (from 3.4 prior to induction), consistent with an excellent response to treatment. 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 -She underwent high-dose chemo with melphalan and  [facility] Dr. LAris Lot  White cells engrafted 01/03/21 and platelets 01/13/21, last platelet transfusion 01/02/21 -posttreatment PET 04/06/21 showed NED  -posttreatment bone marrow biopsy 04/29/21 showed 10-20% hypocellular marrow with myeloid hypoplasia and 3% plasma cells -Continue prophylaxis with valacyclovir for 1 year and dapsone for 6 months posttransplant; [facility]she began maintenance Revlimid 10 mg daily days 1-21 q. 28 days on 05/03/21. She is tolerating well with no noticeable side effects.  She has recovered well from pulmonary transplant. -We again reviewed side effect from long-term Revlimid, especially risk of infection, thrombosis, and small risk of secondary cancer.  I encouraged her to continue annual mammogram, colonoscopy per screening guidelines, and Pap smear.  She agrees with the plan.   2. Low back pain, right hip pain -work up per ortho showed multiple T1 lesions of her spine and sacrum compatible with MM on her 07/03/20 MRI lumbar spine. -PET 07/24/20 showed large hypermetabolic lesions in the left and right pelvis and a faintly metabolic lesion in the left scapula -She completed palliative radiation to the pelvis, left scapula, T4-6, and T10-12 on 3/3-3/16/22. Pain resolved now. -She received 4 doses of monthly Zometa 3/7-11/02/20, resumed 05/28/21, will continue every 3 months for a total of 2 years  -She has tailbone and right shoulder arthritic pain at baseline, overall mild and intermittent now    3. 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 -Hemoglobin electrophoresis showed increase in Hg C, consistent with Hg C trait.  She is aware her children are at risk of hemoglobin C trait -Continue  supplements per transplant team -hgb improving, 10.2 today (07/23/21)  -OK to stop oral iron   4. HTN, OA, Obesity, Cancer  Screenings -On chlorthalidone for HTN -She will continue to f/u with PCP, Chiropractor and Ortho.  -She has been able to gain back some of the weight she previously lost. -she is up-to-date on appropriate screening. Colonoscopy due 10/2023.   5. Anxiety, depression -under care of [facility] provider, tolerating Zoloft -She has strong faith and good family/social support.  She feels very positive about her experience and wants to share with others     Plan: -continue Revlimid 10 mg p.o. daily 3 weeks on/1 week off -lab and zometa in one month  -lab and f/u in 2 months   No problem-specific Assessment & Plan notes found for this encounter.   SUMMARY OF ONCOLOGIC HISTORY: Oncology History Overview Note  Cancer Staging Multiple myeloma ([place]) Staging form: Plasma Cell Myeloma and Plasma Cell Disorders, AJCC 8th Edition - Clinical stage from 07/20/2020: Beta-2-microglobulin (mg/L): 2.4, Albumin (g/dL): 3.2, ISS: Stage II, High-risk cytogenetics: Unknown, LDH: Normal - Signed by Truitt Merle, MD on 08/02/2020 Beta 2 microglobulin range (mg/L): Less than 3.5 Albumin range (g/dL): Less than 3.5 Cytogenetics: Unknown    Multiple myeloma ([place])  07/03/2020 Imaging   MRI Lumbar Spine  IMPRESSION: 1. Numerous T1 hypointense and STIR hyperintense lesions throughout the visualized spine and sacrum with multiple areas of extraosseous extension in the sacrum, detailed above and compatible with multiple myeloma given the clinical history. 2. An MRI of the pelvis with contrast could evaluate the full extent of the partially imaged sacral lesions, including left S1-S2 neural foraminal involvement and suspected involvement of the exited left L5 nerve. 3. Multilevel degenerative change without significant canal or foraminal stenosis in the lumbar spine.   07/13/2020 Initial Diagnosis   Multiple myeloma ([place])   07/20/2020 Cancer Staging   Staging form: Plasma Cell Myeloma and Plasma Cell Disorders, AJCC 8th  Edition - Clinical stage from 07/20/2020: Beta-2-microglobulin (mg/L): 2.4, Albumin (g/dL): 3.2, ISS: Stage II, High-risk cytogenetics: Unknown, LDH: Normal - Signed by Truitt Merle, MD on 08/31/2020 Beta 2 microglobulin range (mg/L): Less than 3.5 Albumin range (g/dL): Less than 3.5 Cytogenetics: No abnormalities Bone disease on imaging: Present    07/24/2020 PET scan   IMPRESSION: 1. Moderate to high hypermetabolic activity associated with the expansile soft tissue masses within the LEFT and RIGHT pelvic bones is consistent with active multiple myeloma / plasmacytoma. 2. Lytic lesion in the LEFT scapula with mild metabolic activity is indeterminate. 3. Metabolic activity associated with the RIGHT knee prosthetic and RIGHT distal foot fusion is favored post procedural inflammation.     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  The findings are most consistent with  persistent/recurrent/previously known plasma cell neoplasm.  For  completeness, immunohistochemical stain for CD138 and in situ  hybridization for kappa and lambda will be performed and the results  reported in an addendum.  Correlation with cytogenetic and FISH  studies  is recommended.   08/03/2020 -  Chemotherapy   Zometa q4weeks starting 08/03/20    08/17/2020 - 11/29/2020 Chemotherapy   Velcade weekly, Revlimid, dex $RemoveBefor'20mg'OMNLMfPBhCpO$  weekly (VRd)  Starting 08/17/20. Last dose Velcade 11/23/20 and last dose  revlimid on 11/29/20       INTERVAL HISTORY:  Hannah [name] is here for a follow up of MM. She was last seen by me on 05/28/21. She presents to the clinic alone. She reports she is doing well overall, denies issues from the revlimid. She has gained some weight back.   All other systems were reviewed with the patient and are negative.  MEDICAL HISTORY:  Past Medical History:  Diagnosis Date   Allergy    Anemia    Anxiety    Arthritis    Asthma    Depression    Hypertension     SURGICAL HISTORY: Past Surgical History:  Procedure Laterality Date   CHOLECYSTECTOMY  1993   COLONOSCOPY     TOTAL KNEE ARTHROPLASTY Right 01/28/2016   TOTAL KNEE ARTHROPLASTY Right 01/28/2016   Procedure: RIGHT TOTAL KNEE ARTHROPLASTY;  Surgeon: [name], MD;  Location: [facility];  Service: Orthopedics;  Laterality: Right;   TRIGGER FINGER RELEASE Right 06/18/2014   Procedure: RIGHT LONG FINGER TRIGGER RELEASE;  Surgeon: [name], MD;  Location: [facility];  Service: Orthopedics;  Laterality: Right;   TUBAL LIGATION     VAGINAL HYSTERECTOMY Bilateral 11/04/2016   Procedure: HYSTERECTOMY VAGINAL with Bilateral Salpingectomy;  Surgeon: [name], MD;  Location: [facility];  Service: Gynecology;  Laterality: Bilateral;   WEIL OSTEOTOMY Right 07/07/2020   Procedure: RIGHT FOOT WEIL OSTEOTOMY 2, 3, AND 4 METATARSALS AND PROXIMAL INTERPHALANGEAL JOINT FUSION 2 & 4 TOES;  Surgeon: Newt Minion, MD;  Location: [facility];  Service: Orthopedics;  Laterality: Right;    I have reviewed the social history and family history with the patient and they are unchanged from previous note.  ALLERGIES:  is allergic to elemental sulfur and sulfa antibiotics.  MEDICATIONS:  Current Outpatient Medications  Medication Sig Dispense Refill   aspirin EC 81 MG tablet Take 81 mg by mouth daily. Swallow whole.     calcium carbonate (OSCAL) 1500 (600 Ca) MG TABS tablet Take 600 mg of  elemental calcium by mouth daily.     Cholecalciferol (VITAMIN D3) 1.25 MG (50000 UT) CAPS Take 1 capsule by mouth once a week.     dapsone 100 MG tablet Take by mouth.     ferrous sulfate 325 (65 FE) MG tablet Take 325 mg by mouth 2 (two) times daily with a meal.     folic acid (FOLVITE) 1 MG tablet folic acid 1 mg tablet  TAKE 1 TABLET BY MOUTH EVERY DAY     lenalidomide (REVLIMID) 10 MG capsule Take 1 capsule (10 mg total) by mouth daily. Take 1 capsule (10 mg total) by mouth for 21 days.  [employer] Auth #  [id number]   Date Obtained:  Jul 21, 2021 21 capsule 0   naproxen sodium (ALEVE) 220 MG tablet as needed.     ondansetron (ZOFRAN) 8 MG tablet Take 1 tablet (8 mg total) by mouth every 8 (eight) hours as needed for nausea or vomiting. (Patient not taking: Reported on 04/27/2021) 20 tablet 3   potassium chloride (MICRO-K) 10 MEQ CR capsule Take by mouth.     prochlorperazine (COMPAZINE) 10 MG tablet Take 1 tablet (10 mg total)  by mouth every 6 (six) hours as needed for nausea or vomiting. (Patient not taking: Reported on 04/27/2021) 30 tablet 3   REVLIMID 10 MG capsule TAKE 1 CAPSULE BY MOUTH ONCE DAILY FOR 21 DAYS 21 capsule 0   valACYclovir (VALTREX) 500 MG tablet Take 500 mg by mouth 2 (two) times daily.     vitamin C (ASCORBIC ACID) 500 MG tablet      No current facility-administered medications for this visit.    PHYSICAL EXAMINATION: ECOG PERFORMANCE STATUS: 1 - Symptomatic but completely ambulatory  Vitals:   07/23/21 1419  BP: (!) 150/94  Pulse: 98  Resp: 17  SpO2: 98%   Wt Readings from Last 3 Encounters:  07/23/21 196 lb 8 oz (89.1 kg)  05/28/21 190 lb 9.6 oz (86.5 kg)  04/27/21 186 lb 6.4 oz (84.6 kg)     GENERAL:alert, no distress and comfortable SKIN: skin color normal, no rashes or significant lesions EYES: normal, Conjunctiva are pink and non-injected, sclera clear  NEURO: alert & oriented x 3 with fluent speech  LABORATORY DATA:  I have reviewed the data as  listed CBC Latest Ref Rng & Units 07/23/2021 05/28/2021 02/24/2021  WBC 4.0 - 10.5 K/uL 5.4 6.0 5.2  Hemoglobin 12.0 - 15.0 g/dL 10.2(L) 9.8(L) 8.8(L)  Hematocrit 36.0 - 46.0 % 32.0(L) 30.5(L) 27.6(L)  Platelets 150 - 400 K/uL 219 167 178     CMP Latest Ref Rng & Units 07/23/2021 05/28/2021 02/24/2021  Glucose 70 - 99 mg/dL 78 80 94  BUN 6 - 20 mg/dL _0 Creatinine 0.44 - 1.00 mg/dL 0.71 0.90 0.68  Sodium 135 - 145 mmol/L 142 141 144  Potassium 3.5 - 5.1 mmol/L 3.7 3.6 3.4(L)  Chloride 98 - 111 mmol/L 107 105 106  CO2 22 - 32 mmol/L _1 Calcium 8.9 - 10.3 mg/dL 8.9 9.1 9.3  Total Protein 6.5 - 8.1 g/dL 7.1 7.1 6.5  Total Bilirubin 0.3 - 1.2 mg/dL 0.3 0.7 0.9  Alkaline Phos 38 - 126 U/L 77 62 65  AST 15 - 41 U/L 13(L) 15 16  ALT 0 - 44 U/L _2 RADIOGRAPHIC STUDIES: I have personally reviewed the radiological images as listed and agreed with the findings in the report. No results found.    No orders of the defined types were placed in this encounter.  All questions were answered. The patient knows to call the clinic with any problems, questions or concerns. No barriers to learning was detected.      Truitt Merle, MD 07/23/2021   I, Wilburn Mylar, am acting as scribe for Truitt Merle, MD.   I have reviewed the above documentation for accuracy and completeness, and I agree with the above.

## 2021-07-26 ENCOUNTER — Telehealth: Payer: Self-pay | Admitting: Hematology

## 2021-07-26 ENCOUNTER — Other Ambulatory Visit: Payer: Self-pay

## 2021-07-26 ENCOUNTER — Ambulatory Visit: Payer: BC Managed Care – PPO

## 2021-07-26 DIAGNOSIS — R293 Abnormal posture: Secondary | ICD-10-CM

## 2021-07-26 DIAGNOSIS — I890 Lymphedema, not elsewhere classified: Secondary | ICD-10-CM | POA: Diagnosis not present

## 2021-07-26 DIAGNOSIS — M6281 Muscle weakness (generalized): Secondary | ICD-10-CM | POA: Diagnosis not present

## 2021-07-26 DIAGNOSIS — C9000 Multiple myeloma not having achieved remission: Secondary | ICD-10-CM | POA: Diagnosis not present

## 2021-07-26 DIAGNOSIS — R262 Difficulty in walking, not elsewhere classified: Secondary | ICD-10-CM

## 2021-07-26 DIAGNOSIS — Z483 Aftercare following surgery for neoplasm: Secondary | ICD-10-CM | POA: Diagnosis not present

## 2021-07-26 DIAGNOSIS — R5383 Other fatigue: Secondary | ICD-10-CM | POA: Diagnosis not present

## 2021-07-26 DIAGNOSIS — R600 Localized edema: Secondary | ICD-10-CM | POA: Diagnosis not present

## 2021-07-26 NOTE — Therapy (Signed)
[facility] @ [facility] [place], [place], [address] Phone: [phone]   Fax:  [phone]  Physical Therapy Treatment  Patient Details  Name: Hannah [name] MRN: [id number] Date of Birth: Dec 16, 1960 Referring Provider (Hannah [name]): Cira Rue   Encounter Date: 07/26/2021   Hannah [name] End of Session - 07/26/21 0827     Visit Number 70    Number of Visits 32    Date for Hannah [name] Re-Evaluation 09/06/21    Authorization Type BC[employer]    Hannah [name] Start Time 0808    Hannah [name] Stop Time 0852    Hannah [name] Time Calculation (min) 44 min    Activity Tolerance Patient tolerated treatment well    Behavior During Therapy [facility] for tasks assessed/performed             Past Medical History:  Diagnosis Date   Allergy    Anemia    Anxiety    Arthritis    Asthma    Depression    Hypertension     Past Surgical History:  Procedure Laterality Date   CHOLECYSTECTOMY  1993   COLONOSCOPY     TOTAL KNEE ARTHROPLASTY Right 01/28/2016   TOTAL KNEE ARTHROPLASTY Right 01/28/2016   Procedure: RIGHT TOTAL KNEE ARTHROPLASTY;  Surgeon: [name], MD;  Location: [facility];  Service: Orthopedics;  Laterality: Right;   TRIGGER FINGER RELEASE Right 06/18/2014   Procedure: RIGHT LONG FINGER TRIGGER RELEASE;  Surgeon: [name], MD;  Location: [facility];  Service: Orthopedics;  Laterality: Right;   TUBAL LIGATION     VAGINAL HYSTERECTOMY Bilateral 11/04/2016   Procedure: HYSTERECTOMY VAGINAL with Bilateral Salpingectomy;  Surgeon: [name], MD;  Location: [facility];  Service: Gynecology;  Laterality: Bilateral;   WEIL OSTEOTOMY Right 07/07/2020   Procedure: RIGHT FOOT WEIL OSTEOTOMY 2, 3, AND 4 METATARSALS AND PROXIMAL INTERPHALANGEAL JOINT FUSION 2 & 4 TOES;  Surgeon: Newt Minion, MD;  Location: [facility];  Service: Orthopedics;  Laterality: Right;    There were no vitals filed for this visit.   Subjective Assessment - 07/26/21 0808      Subjective Made an appt with the orthopedic on Thursday for my shoulder with Dr. [name]. I might be going back to work soon, but I work from home. My abdominals were really sore after last visit.   Pertinent History Hannah [name] was diagnosed in 2019 with smouldering multiple myeloma. She as diagnosed in February 2022 with Multiple Myeloma not in remission. 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 no limits    How long can you stand comfortably? 75mn    How long can you walk comfortably? 52m    Diagnostic tests no scans scheduled recently    Currently in Pain? Yes    Pain Score 4     Pain Location Shoulder    Pain Orientation Right    Pain Descriptors / Indicators Aching;Throbbing    Pain Type Chronic pain    Pain Onset More than a month ago    Pain Frequency Constant                               [facility] Adult Hannah [name] Treatment/Exercise - 07/26/21 0001       Exercises   Other Exercises  Nu step seat 9, UE 9 x 7 min. 6 in step ups forward and laterally x 10 B, mini squats 2x10, lateral stepping x 10 4 laps, step and hold on airex forward and lateral x 10 bilateral, supine ppt,  ppt with SLR 2 x 5 B, SL lowering  2x 5 B     Shoulder Exercises: Supine   Other Supine Exercises AA shoulder flexion x 3      Manual Therapy   Soft tissue mobilization to right UT, anterior , posterior and lateral shoulder with cocoa butter.                          Hannah [name] Long Term Goals - 07/12/21 0850       Hannah [name] LONG TERM GOAL #1   Title Hannah [name] will return to the gym    Baseline not yet, but improving and concerned about covid/fluid    Status On-going      Hannah [name] LONG TERM GOAL #2   Title Hannah [name] will improve 29mn walk test to at least 10086fto demonstrate decreased fall risk    Baseline 643, 1061 today    Time 8    Period Weeks    Status Achieved    Target Date  05/18/21      Hannah [name] LONG TERM GOAL #3   Title Hannah [name] will be able to perform a 5 x sts in 20 sec or less showing improved LE strength and    Time 8    Period Weeks    Status Achieved    Target Date 05/18/21      Hannah [name] LONG TERM GOAL #4   Title Hannah [name] will be able to go up and down stairs at home    Time 8    Period Weeks    Status Achieved    Target Date 12/14/20      Hannah [name] LONG TERM GOAL #5   Title Able to hold tandem stance and SLS for 10 seconds.    Baseline 35 sec tandem each side stopped by Hannah [name], Right SLS 10, left 22    Time 8    Period Weeks    Status Achieved    Target Date 07/12/21      Hannah [name] LONG TERM GOAL #6   Title Able to walk 1250 ft in 6 min to demonstrate improved gait speed    Baseline walked 772 feet but had to stop early secondary to weakness in legs    Period Weeks    Status On-going    Target Date 09/06/21      Hannah [name] LONG TERM GOAL #7   Title Hannah [name] will improve right shoulder ROM for flexion and abd to atleast 110 degrees for improved ability to reach.    Time 8    Period Weeks    Status New    Target Date 09/06/21      Hannah [name] LONG TERM GOAL #8   Title Hannah [name] will improve bilateral grip strength to atleast 30 # for improved function    Time 8    Period Weeks    Status New    Target Date 09/06/21                   Plan - 07/26/21 [id number]   Clinical Impression Statement Initiated soft tissue mobilization to right shoulder prior to therapy and Hannah [name] felt this was beneficial. continued balance, strength exs, and core strength activites.  Hannah [name] demonstrates improved endurance  with all. She was able to increase repetitions with several activities and with less fatigue.  Balance overall improved but she experienced several losses of balance when she lost her focus of attention    Personal Factors and Comorbidities Comorbidity 3+    Comorbidities Multiple Myeloma, multi joint OA, recent foot surgery, fall risk,SCT    Examination-Activity Limitations  Sleep;Squat;Stand;Stairs;Transfers;Locomotion Level;Sit    Examination-Participation Restrictions Cleaning;Community Activity;Occupation;Shop    Stability/Clinical Decision Making Stable/Uncomplicated    Rehab Potential Good    Hannah [name] Frequency 3x / week    Hannah [name] Duration 8 weeks    Hannah [name] Treatment/Interventions ADLs/Self Care Home Management;Aquatic Therapy;Gait training;Stair training;Functional mobility training;Therapeutic activities;Therapeutic exercise;Balance training;Neuromuscular re-education;Manual techniques;Patient/family education;Passive range of motion;Joint Manipulations    Hannah [name] Next Visit Plan STM to right shoulder,continue aquatic therapy, stab exs, shoulder ROM, add grip strength, endurance    Hannah [name] Home Exercise Plan mini squats, sidestepping, step ups,stretches,ppt,    Consulted and Agree with Plan of Care Patient             Patient will benefit from skilled therapeutic intervention in order to improve the following deficits and impairments:  Abnormal gait, Decreased mobility, Decreased activity tolerance, Decreased endurance, Decreased strength, Decreased balance, Decreased knowledge of precautions, Difficulty walking, Postural dysfunction, Pain  Visit Diagnosis: Abnormal posture  Multiple myeloma not having achieved remission (HCC)  Difficulty in walking, not elsewhere classified  Muscle weakness (generalized)     Problem List Patient Active Problem List   Diagnosis Date Noted   Multiple myeloma ([place]) 07/13/2020   Claw toe, right    Trigger finger, left middle finger 04/09/2020   Stenosing tenosynovitis of finger of left hand 02/27/2020   Hemoglobin C trait ([place]) 02/27/2018   Major depression, recurrent ([place]) 01/12/2018   Chronic right shoulder pain 12/20/2017   MDD (major depressive disorder), recurrent episode, moderate ([facility] [place]) 11/04/2017   MGUS (monoclonal gammopathy of unknown significance) 10/24/2017   Primary osteoarthritis of both hands 09/26/2017   Primary  osteoarthritis of right shoulder 09/26/2017   Primary osteoarthritis of both hips 09/26/2017   Status post total knee replacement, right 09/26/2017   Primary osteoarthritis of both feet 09/26/2017   History of asthma 09/26/2017   Primary osteoarthritis of left knee 04/18/2017   Menorrhagia 11/04/2016   Fibroid tumor 11/04/2016   Adenomyosis 11/04/2016   Hypertension, essential 11/04/2016   Total knee replacement status 01/28/2016    Hannah [name], Hannah [name] 07/26/2021, 8:56 AM  [facility] @ [facility] [place], [place], [address] Phone: [phone]   Fax:  [phone]  Name: Hannah [name] MRN: [id number] Date of Birth: [birth year]

## 2021-07-26 NOTE — Telephone Encounter (Signed)
Left message with follow-up appointments per 2/24 los.

## 2021-07-28 ENCOUNTER — Ambulatory Visit: Payer: BC Managed Care – PPO | Attending: Family | Admitting: Physical Therapy

## 2021-07-28 ENCOUNTER — Other Ambulatory Visit: Payer: Self-pay

## 2021-07-28 ENCOUNTER — Encounter: Payer: Self-pay | Admitting: Physical Therapy

## 2021-07-28 DIAGNOSIS — R5383 Other fatigue: Secondary | ICD-10-CM | POA: Insufficient documentation

## 2021-07-28 DIAGNOSIS — M6281 Muscle weakness (generalized): Secondary | ICD-10-CM | POA: Diagnosis not present

## 2021-07-28 DIAGNOSIS — R293 Abnormal posture: Secondary | ICD-10-CM | POA: Diagnosis not present

## 2021-07-28 DIAGNOSIS — R262 Difficulty in walking, not elsewhere classified: Secondary | ICD-10-CM | POA: Insufficient documentation

## 2021-07-28 DIAGNOSIS — C9000 Multiple myeloma not having achieved remission: Secondary | ICD-10-CM | POA: Diagnosis not present

## 2021-07-28 NOTE — Therapy (Signed)
[facility] ?[place], [place], [address] ?Phone: [phone]   Fax:  [phone] ? ?Physical Therapy Treatment ? ?Patient Details  ?Name: Hannah [name] ?MRN: [id number] ?Date of Birth: July 29, 1960 ?Referring Provider (PT): Cira Rue ? ? ?Encounter Date: 07/28/2021 ? ? PT End of Session - 07/28/21 0847   ? ? Visit Number 48   ? Number of Visits 69   ? Date for PT Re-Evaluation 09/06/21   ? Authorization Type BCBS   ? PT Start Time [phone]   ? PT Stop Time 0930   ? PT Time Calculation (min) 43 min   ? Activity Tolerance Patient tolerated treatment well   ? Behavior During Therapy [facility] for tasks assessed/performed   ? ?  ?  ? ?  ? ? ?Past Medical History:  ?Diagnosis Date  ? Allergy   ? Anemia   ? Anxiety   ? Arthritis   ? Asthma   ? Depression   ? Hypertension   ? ? ?Past Surgical History:  ?Procedure Laterality Date  ? CHOLECYSTECTOMY  1993  ? COLONOSCOPY    ? TOTAL KNEE ARTHROPLASTY Right 01/28/2016  ? TOTAL KNEE ARTHROPLASTY Right 01/28/2016  ? Procedure: RIGHT TOTAL KNEE ARTHROPLASTY;  Surgeon: [name], MD;  Location: [facility];  Service: Orthopedics;  Laterality: Right;  ? TRIGGER FINGER RELEASE Right 06/18/2014  ? Procedure: RIGHT LONG FINGER TRIGGER RELEASE;  Surgeon: [name], MD;  Location: [facility];  Service: Orthopedics;  Laterality: Right;  ? TUBAL LIGATION    ? VAGINAL HYSTERECTOMY Bilateral 11/04/2016  ? Procedure: HYSTERECTOMY VAGINAL with Bilateral Salpingectomy;  Surgeon: [name], MD;  Location: [facility];  Service: Gynecology;  Laterality: Bilateral;  ? WEIL OSTEOTOMY Right 07/07/2020  ? Procedure: RIGHT FOOT WEIL OSTEOTOMY 2, 3, AND 4 METATARSALS AND PROXIMAL INTERPHALANGEAL JOINT FUSION 2 & 4 TOES;  Surgeon: Newt Minion, MD;  Location: [facility];  Service: Orthopedics;  Laterality: Right;  ? ? ?There were no vitals filed for this visit. ? ? ?Treatment: Pt arrives for aquatic physical  therapy. Treatment took place in 3.5-5.5 feet of water. Water temperature was 92 degrees F. Pt entered the pool via stairs with mild use of rails. Pt requires buoyancy of water for support and to offload joints with strengthening exercises.  Pt utilizes viscosity of the water required for strengthening.  ? ?Standing in 50%- 75% depth water pt performed water walking in all 4 directions 10x. VC for speed in order to generate appropriate current for resistance and/or UE movements. Hand paddles added for side stepping, yellow noodle used for forward & back.  ? ?Wall exercises to inc: Hip 3 ways 20x ea with min support of pool wall. VC to push/pull against water with a force appropriate. Core contractions in partial wall squat with neck pillow 5 sec hold 10x. Push single bouy wt underwater with VC to contract triceps and core hold 20 sec 3x. Added high knee marching holding the wts underwater 2x10x. Hand paddles for shoulder ext/flex 2x10, horizontal add/abd 2x 10, VC to maintain core throughout exercises. Underwater bicycle using yellow noodle behind pt back 10 min.  ? ? ? ? ? ? ? ? ? ? ? ? ? ? ? ? ? ? ? ? ? ? ? ? ? ? ? ? ? ? ? ? PT Long Term Goals - 07/12/21 0850   ? ?  ? PT LONG TERM  GOAL #1  ? Title Pt will return to the gym   ? Baseline not yet, but improving and concerned about covid/fluid   ? Status On-going   ?  ? PT LONG TERM GOAL #2  ? Title Pt will improve 58mn walk test to at least 10068fto demonstrate decreased fall risk   ? Baseline 643, 1061 today   ? Time 8   ? Period Weeks   ? Status Achieved   ? Target Date 05/18/21   ?  ? PT LONG TERM GOAL #3  ? Title Pt will be able to perform a 5 x sts in 20 sec or less showing improved LE strength and   ? Time 8   ? Period Weeks   ? Status Achieved   ? Target Date 05/18/21   ?  ? PT LONG TERM GOAL #4  ? Title Pt will be able to go up and down stairs at home   ? Time 8   ? Period Weeks   ? Status Achieved   ? Target Date 12/14/20   ?  ? PT LONG TERM GOAL #5  ?  Title Able to hold tandem stance and SLS for 10 seconds.   ? Baseline 35 sec tandem each side stopped by PT, Right SLS 10, left 22   ? Time 8   ? Period Weeks   ? Status Achieved   ? Target Date 07/12/21   ?  ? PT LONG TERM GOAL #6  ? Title Able to walk 1250 ft in 6 min to demonstrate improved gait speed   ? Baseline walked 772 feet but had to stop early secondary to weakness in legs   ? Period Weeks   ? Status On-going   ? Target Date 09/06/21   ?  ? PT LONG TERM GOAL #7  ? Title Pt will improve right shoulder ROM for flexion and abd to atleast 110 degrees for improved ability to reach.   ? Time 8   ? Period Weeks   ? Status New   ? Target Date 09/06/21   ?  ? PT LONG TERM GOAL #8  ? Title Pt will improve bilateral grip strength to atleast 30 # for improved function   ? Time 8   ? Period Weeks   ? Status New   ? Target Date 09/06/21   ? ?  ?  ? ?  ? ? ? ? ? ? ? ? Plan - 07/28/21 1329   ? ? Clinical Impression Statement Pt arrives to aquatic PT with no pain, feeling well overall. Pt reports it was difficult getting an appt for the pool. Pt able to tolerate all TE well including holding buoyant objects underwater for 20 sec and keeping her core contracted. No excessive fatigue demonstrated.   ? Personal Factors and Comorbidities Comorbidity 3+   ? Comorbidities Multiple Myeloma, multi joint OA, recent foot surgery, fall risk,SCT   ? Examination-Activity Limitations Sleep;Squat;Stand;Stairs;Transfers;Locomotion Level;Sit   ? Rehab Potential Good   ? PT Frequency 3x / week   ? PT Next Visit Plan STM to right shoulder,continue aquatic therapy, stab exs, shoulder ROM, add grip strength, endurance   ? PT Home Exercise Plan mini squats, sidestepping, step ups,stretches,ppt,   ? Consulted and Agree with Plan of Care Patient   ? ?  ?  ? ?  ? ? ?Patient will benefit from skilled therapeutic intervention in order to improve the following deficits and impairments:  Abnormal gait, Decreased  mobility, Decreased activity  tolerance, Decreased endurance, Decreased strength, Decreased balance, Decreased knowledge of precautions, Difficulty walking, Postural dysfunction, Pain ? ?Visit Diagnosis: ?Abnormal posture ? ?Multiple myeloma not having achieved remission ([place]) ? ?Difficulty in walking, not elsewhere classified ? ? ? ? ?Problem List ?Patient Active Problem List  ? Diagnosis Date Noted  ? Multiple myeloma ([place]) 07/13/2020  ? Claw toe, right   ? Trigger finger, left middle finger 04/09/2020  ? Stenosing tenosynovitis of finger of left hand 02/27/2020  ? 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 Major depression, recurrent ([place]) 01/12/2018  ? Chronic right shoulder pain 12/20/2017  ? MDD (major depressive disorder), recurrent episode, moderate ([place] [facility] [place]) 11/04/2017  ? MGUS (monoclonal gammopathy of unknown significance) 10/24/2017  ? Primary osteoarthritis of both hands 09/26/2017  ? Primary osteoarthritis of right shoulder 09/26/2017  ? Primary osteoarthritis of both hips 09/26/2017  ? Status post total knee replacement, right 09/26/2017  ? Primary osteoarthritis of both feet 09/26/2017  ? History of asthma 09/26/2017  ? Primary osteoarthritis of left knee 04/18/2017  ? Menorrhagia 11/04/2016  ? Fibroid tumor 11/04/2016  ? Adenomyosis 11/04/2016  ? Hypertension, essential 11/04/2016  ? Total knee replacement status 01/28/2016  ? ? ?Juliani Laduke, PTA ?07/28/2021, 1:32 PM ? ?[facility] ?[facility] ?[place], [place], [address] ?Phone: [phone]   Fax:  [phone] ? ?Name: CARRY WEESNER ?MRN: [id number] ?Date of Birth: [birth year] ? ? ? ?

## 2021-07-29 ENCOUNTER — Ambulatory Visit (INDEPENDENT_AMBULATORY_CARE_PROVIDER_SITE_OTHER): Payer: BC Managed Care – PPO | Admitting: Orthopedic Surgery

## 2021-07-29 DIAGNOSIS — M19011 Primary osteoarthritis, right shoulder: Secondary | ICD-10-CM

## 2021-07-30 ENCOUNTER — Encounter: Payer: Self-pay | Admitting: Orthopedic Surgery

## 2021-07-30 MED ORDER — LIDOCAINE HCL 1 % IJ SOLN
5.0000 mL | INTRAMUSCULAR | Status: AC | PRN
  Administered 2021-07-29: 5 mL

## 2021-07-30 MED ORDER — METHYLPREDNISOLONE ACETATE 40 MG/ML IJ SUSP
40.0000 mg | INTRAMUSCULAR | Status: AC | PRN
  Administered 2021-07-29: 40 mg via INTRA_ARTICULAR

## 2021-07-30 MED ORDER — BUPIVACAINE HCL 0.5 % IJ SOLN
9.0000 mL | INTRAMUSCULAR | Status: AC | PRN
  Administered 2021-07-29: 9 mL via INTRA_ARTICULAR

## 2021-07-30 NOTE — Progress Notes (Signed)
? ?Office Visit Note ?  ?Patient: Hannah [name]           ?Date of Birth: [birth year]           ?MRN: [id number] ?Visit Date: 07/29/2021 ?Requested by: [name], MD ?[address] ?[place],  [place] [address] ?PCP: [name], MD ? ?Subjective: ?Chief Complaint  ?Patient presents with  ? Right Shoulder - Follow-up  ? ? ?HPI: Patient presents with right shoulder pain.  Last seen December 2021.  Reports worsening shoulder pain.  Has a known history of glenohumeral arthritis.  Radiographs from March 2021 do show arthritis present.  MRI from 2018 showed a rim rent tear of the rotator cuff with glenohumeral arthritis present.  She had 3 weeks of relief with right glenohumeral joint injection in 2018.  Taking Aleve and Tylenol for symptoms.  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  She is on some immunosuppressive medication. ?             ?ROS: All systems reviewed are negative as they relate to the chief complaint within the history of present illness.  Patient denies  fevers or chills. ? ? ?Assessment & Plan: ?Visit Diagnoses:  ?1. Primary osteoarthritis of right shoulder   ? ? ?Plan: Impression is symptomatic right shoulder arthritis.  She does have diminished range of motion on the right-hand side but remains fairly functional.  Glenohumeral joint injection performed today.  She has not been released to go back to work by her heme-onc team.  She will follow-up with [place] in 6 months for clinical recheck.  Would want to check radiographs of the shoulder prior to any type of procedure. ? ?Follow-Up Instructions: Return in about 6 months (around 01/29/2022).  ? ?Orders:  ?Orders Placed This Encounter  ?Procedures  ? Large Joint Inj: R glenohumeral  ? ?Meds ordered this encounter  ?Medications  ? bupivacaine (MARCAINE) 0.5 % (with pres) injection 9 mL  ? lidocaine  (XYLOCAINE) 1 % (with pres) injection 5 mL  ? methylPREDNISolone acetate (DEPO-MEDROL) injection 40 mg  ? ? ? ? Procedures: ?No procedures performed ? ? ?Clinical Data: ?No additional findings. ? ?Objective: ?Vital Signs: LMP 10/22/2016 (Exact Date)  ? ?Physical Exam:  ? ?Constitutional: Patient appears well-developed ?HEENT:  ?Head: Normocephalic ?Eyes:EOM are normal ?Neck: Normal range of motion ?Cardiovascular: Normal rate ?Pulmonary/chest: Effort normal ?Neurologic: Patient is alert ?Skin: Skin is warm ?Psychiatric: Patient has normal mood and affect ? ? ?Ortho Exam: Ortho exam demonstrates good cervical spine range of motion.  Shoulder range of motion on the right is 30/75/110.  Shoulder range of motion on the left is 45/105/170.  Rotator cuff strength is good on the right-hand side infraspinatus supraspinatus and subscap muscle testing.  No masses lymphadenopathy or skin changes noted in that shoulder girdle region.  Coarse grinding and crepitus is present consistent with known diagnosis of glenohumeral joint arthritis. ? ?Specialty Comments:  ?No specialty comments available. ? ?Imaging: ?No results found. ? ? ?PMFS History: ?Patient Active Problem List  ? Diagnosis Date Noted  ? Multiple myeloma ([place]) 07/13/2020  ? Claw toe, right   ? Trigger finger, left middle finger 04/09/2020  ? Stenosing tenosynovitis of finger of left hand 02/27/2020  ? 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 Major depression, recurrent ([place]) 01/12/2018  ? Chronic  right shoulder pain 12/20/2017  ? MDD (major depressive disorder), recurrent episode, moderate ([place]) 11/04/2017  ? MGUS (monoclonal gammopathy of unknown significance) 10/24/2017  ? Primary osteoarthritis of both hands 09/26/2017  ? Primary osteoarthritis of right shoulder 09/26/2017  ? Primary osteoarthritis of both hips 09/26/2017  ? Status post total knee replacement, right 09/26/2017  ? Primary osteoarthritis of both feet 09/26/2017  ? History of asthma 09/26/2017  ?  Primary osteoarthritis of left knee 04/18/2017  ? Menorrhagia 11/04/2016  ? Fibroid tumor 11/04/2016  ? Adenomyosis 11/04/2016  ? Hypertension, essential 11/04/2016  ? Total knee replacement status 01/28/2016  ? ?Past Medical History:  ?Diagnosis Date  ? Allergy   ? Anemia   ? Anxiety   ? Arthritis   ? Asthma   ? Depression   ? Hypertension   ?  ?Family History  ?Problem Relation Age of Onset  ? Cancer Mother   ?     uterine   ? Asthma Mother   ? Arthritis Daughter   ? Depression Maternal Uncle   ? Colon cancer Neg Hx   ?  ?Past Surgical History:  ?Procedure Laterality Date  ? CHOLECYSTECTOMY  1993  ? COLONOSCOPY    ? TOTAL KNEE ARTHROPLASTY Right 01/28/2016  ? TOTAL KNEE ARTHROPLASTY Right 01/28/2016  ? Procedure: RIGHT TOTAL KNEE ARTHROPLASTY;  Surgeon: [name], MD;  Location: [facility];  Service: Orthopedics;  Laterality: Right;  ? TRIGGER FINGER RELEASE Right 06/18/2014  ? Procedure: RIGHT LONG FINGER TRIGGER RELEASE;  Surgeon: [name], MD;  Location: [facility];  Service: Orthopedics;  Laterality: Right;  ? TUBAL LIGATION    ? VAGINAL HYSTERECTOMY Bilateral 11/04/2016  ? Procedure: HYSTERECTOMY VAGINAL with Bilateral Salpingectomy;  Surgeon: [name], MD;  Location: [facility];  Service: Gynecology;  Laterality: Bilateral;  ? WEIL OSTEOTOMY Right 07/07/2020  ? Procedure: RIGHT FOOT WEIL OSTEOTOMY 2, 3, AND 4 METATARSALS AND PROXIMAL INTERPHALANGEAL JOINT FUSION 2 & 4 TOES;  Surgeon: Newt Minion, MD;  Location: [facility];  Service: Orthopedics;  Laterality: Right;  ? ?Social History  ? ?Occupational History  ? Not on file  ?Tobacco Use  ? Smoking status: Never  ? Smokeless tobacco: Never  ?Vaping Use  ? Vaping Use: Never used  ?Substance and Sexual Activity  ? Alcohol use: No  ?  Alcohol/week: 0.0 standard drinks  ? Drug use: No  ? Sexual activity: Yes  ?  Partners: Male  ?  Birth control/protection: Surgical  ? ? ? ? ? ? ?

## 2021-07-30 NOTE — Progress Notes (Signed)
? ?  Procedure Note ? ?Patient: Hannah [name]             ?Date of Birth: 29-Oct-1960           ?MRN: [id number]             ?Visit Date: 07/29/2021 ? ?Procedures: ?Visit Diagnoses:  ?1. Primary osteoarthritis of right shoulder   ? ? ?Large Joint Inj: R glenohumeral on 07/29/2021 10:17 PM ?Indications: diagnostic evaluation and pain ?Details: 18 G 1.5 in needle, posterior approach ? ?Arthrogram: No ? ?Medications: 9 mL bupivacaine 0.5 %; 40 mg methylPREDNISolone acetate 40 MG/ML; 5 mL lidocaine 1 % ?Outcome: tolerated well, no immediate complications ?Procedure, treatment alternatives, risks and benefits explained, specific risks discussed. Consent was given by the patient. Immediately prior to procedure a time out was called to verify the correct patient, procedure, equipment, support staff and site/side marked as required. Patient was prepped and draped in the usual sterile fashion.  ? ? ? ? ? ?

## 2021-08-02 ENCOUNTER — Other Ambulatory Visit: Payer: Self-pay

## 2021-08-02 ENCOUNTER — Ambulatory Visit: Payer: BC Managed Care – PPO

## 2021-08-02 DIAGNOSIS — R293 Abnormal posture: Secondary | ICD-10-CM

## 2021-08-02 DIAGNOSIS — M6281 Muscle weakness (generalized): Secondary | ICD-10-CM

## 2021-08-02 DIAGNOSIS — C9000 Multiple myeloma not having achieved remission: Secondary | ICD-10-CM | POA: Diagnosis not present

## 2021-08-02 DIAGNOSIS — R262 Difficulty in walking, not elsewhere classified: Secondary | ICD-10-CM | POA: Diagnosis not present

## 2021-08-02 DIAGNOSIS — R5383 Other fatigue: Secondary | ICD-10-CM | POA: Diagnosis not present

## 2021-08-02 NOTE — Therapy (Signed)
[facility] @ [facility], [place], [address] Phone: [phone]   Fax:  [phone]  Physical Therapy Treatment  Patient Details  Name: Hannah [name] MRN: [id number] Date of Birth: Nov 01, 1960 Referring Provider (PT): Cira Rue   Encounter Date: 08/02/2021   PT End of Session - 08/02/21 1156     Visit Number 36    Number of Visits 8    Date for PT Re-Evaluation 09/06/21    Authorization Type BCBS    PT Start Time 1108    PT Stop Time 1154    PT Time Calculation (min) 46 min    Activity Tolerance Patient tolerated treatment well    Behavior During Therapy [facility] for tasks assessed/performed             Past Medical History:  Diagnosis Date   Allergy    Anemia    Anxiety    Arthritis    Asthma    Depression    Hypertension     Past Surgical History:  Procedure Laterality Date   CHOLECYSTECTOMY  1993   COLONOSCOPY     TOTAL KNEE ARTHROPLASTY Right 01/28/2016   TOTAL KNEE ARTHROPLASTY Right 01/28/2016   Procedure: RIGHT TOTAL KNEE ARTHROPLASTY;  Surgeon: [name], MD;  Location: [facility];  Service: Orthopedics;  Laterality: Right;   TRIGGER FINGER RELEASE Right 06/18/2014   Procedure: RIGHT LONG FINGER TRIGGER RELEASE;  Surgeon: [name], MD;  Location: [facility];  Service: Orthopedics;  Laterality: Right;   TUBAL LIGATION     VAGINAL HYSTERECTOMY Bilateral 11/04/2016   Procedure: HYSTERECTOMY VAGINAL with Bilateral Salpingectomy;  Surgeon: [name], MD;  Location: [facility];  Service: Gynecology;  Laterality: Bilateral;   WEIL OSTEOTOMY Right 07/07/2020   Procedure: RIGHT FOOT WEIL OSTEOTOMY 2, 3, AND 4 METATARSALS AND PROXIMAL INTERPHALANGEAL JOINT FUSION 2 & 4 TOES;  Surgeon: Newt Minion, MD;  Location: [facility];  Service: Orthopedics;  Laterality: Right;    There were no vitals filed for this visit.   Subjective Assessment - 08/02/21 1108      Subjective I got an injection last Thursday in my right shoulder.  It feels much better now.  It doesn't hurt all the time. 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 She as diagnosed in February 2022 with Multiple Myeloma not in remission. 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 no limits    How long can you stand comfortably? 10mn    How long can you walk comfortably? 549m    Diagnostic tests no scans scheduled recently    Patient Stated Goals get strong, build up the muscles    Currently in Pain? Yes    Pain Score 1     Pain Location Shoulder    Pain Orientation Right    Pain Descriptors / Indicators Aching    Pain Onset More than a month ago    Multiple Pain Sites No                               [facility] Adult PT Treatment/Exercise - 08/02/21 0001  Exercises   Other Exercises  Nu step seat and UE  9, lev 6 x 5 min,, mini squats 2 x 10, lateral band, and monster walks 4 laps x 10 steps with yellow TB, airex step and hold x 10 forward and 10 lateral, finger ladder to 27 for flexion with right UE,  airex beam 6 lengths forward and 6 lengths sideways no hands, rocker board x3 for gastroc stretch 20 sec hold. supine SLR with core stab x 15 ea, SLL 2 X5, opp hand to knee core stab x 15 B,   piriformis stretch 2 x 2--30 sec B                     PT Education - 08/02/21 1148     Education Details lateral band and monster walks with yellow band, bridge    Person(s) Educated Patient    Methods Explanation;Demonstration;Handout    Comprehension Returned demonstration                 PT Long Term Goals - 07/12/21 0850       PT LONG TERM GOAL #1   Title Pt will return to the gym    Baseline not yet, but improving and  concerned about covid/fluid    Status On-going      PT LONG TERM GOAL #2   Title Pt will improve 61mn walk test to at least 10069fto demonstrate decreased fall risk    Baseline 643, 1061 today    Time 8    Period Weeks    Status Achieved    Target Date 05/18/21      PT LONG TERM GOAL #3   Title Pt will be able to perform a 5 x sts in 20 sec or less showing improved LE strength and    Time 8    Period Weeks    Status Achieved    Target Date 05/18/21      PT LONG TERM GOAL #4   Title Pt will be able to go up and down stairs at home    Time 8    Period Weeks    Status Achieved    Target Date 12/14/20      PT LONG TERM GOAL #5   Title Able to hold tandem stance and SLS for 10 seconds.    Baseline 35 sec tandem each side stopped by PT, Right SLS 10, left 22    Time 8    Period Weeks    Status Achieved    Target Date 07/12/21      PT LONG TERM GOAL #6   Title Able to walk 1250 ft in 6 min to demonstrate improved gait speed    Baseline walked 772 feet but had to stop early secondary to weakness in legs    Period Weeks    Status On-going    Target Date 09/06/21      PT LONG TERM GOAL #7   Title Pt will improve right shoulder ROM for flexion and abd to atleast 110 degrees for improved ability to reach.    Time 8    Period Weeks    Status New    Target Date 09/06/21      PT LONG TERM GOAL #8   Title Pt will improve bilateral grip strength to atleast 30 # for improved function    Time 8    Period Weeks    Status New    Target Date 09/06/21  Plan - 08/02/21 1114     Clinical Impression Statement Pt did exceptionally well with therapy.  Progressed to light theraband for LE strength with fatigue only, and pt able to get to 27 on the finger ladder with tightness only.  She did not require any rest breaks between exercises today. Overall improved with balanve activities.  Left hip still very tight with piriformis stretching. HEP was updated to  include LE theraband    Personal Factors and Comorbidities Comorbidity 3+    Comorbidities Multiple Myeloma, multi joint OA, recent foot surgery, fall risk,SCT    Examination-Activity Limitations Sleep;Squat;Stand;Stairs;Transfers;Locomotion Level;Sit    Examination-Participation Restrictions Cleaning;Community Activity;Occupation;Shop    Stability/Clinical Decision Making Stable/Uncomplicated    Rehab Potential Good    PT Frequency 3x / week    PT Duration 8 weeks    PT Treatment/Interventions ADLs/Self Care Home Management;Aquatic Therapy;Gait training;Stair training;Functional mobility training;Therapeutic activities;Therapeutic exercise;Balance training;Neuromuscular re-education;Manual techniques;Patient/family education;Passive range of motion;Joint Manipulations    PT Next Visit Plan Walk test,STM to right shoulder,continue aquatic therapy, stab exs, shoulder ROM, add grip strength, endurance    PT Home Exercise Plan mini squats, sidestepping and moster walk yellow TB, step ups,stretches,ppt,  bridges   Consulted and Agree with Plan of Care Patient             Patient will benefit from skilled therapeutic intervention in order to improve the following deficits and impairments:  Abnormal gait, Decreased mobility, Decreased activity tolerance, Decreased endurance, Decreased strength, Decreased balance, Decreased knowledge of precautions, Difficulty walking, Postural dysfunction, Pain  Visit Diagnosis: Abnormal posture  Multiple myeloma not having achieved remission ([facility])  Difficulty in walking, not elsewhere classified  Muscle weakness (generalized)     Problem List Patient Active Problem List   Diagnosis Date Noted   Multiple myeloma ([facility]) 07/13/2020   Claw toe, right    Trigger finger, left middle finger 04/09/2020   Stenosing tenosynovitis of finger of left hand 02/27/2020   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   Major depression, recurrent ([place]) 01/12/2018   Chronic  right shoulder pain 12/20/2017   MDD (major depressive disorder), recurrent episode, moderate ([facility]) 11/04/2017   MGUS (monoclonal gammopathy of unknown significance) 10/24/2017   Primary osteoarthritis of both hands 09/26/2017   Primary osteoarthritis of right shoulder 09/26/2017   Primary osteoarthritis of both hips 09/26/2017   Status post total knee replacement, right 09/26/2017   Primary osteoarthritis of both feet 09/26/2017   History of asthma 09/26/2017   Primary osteoarthritis of left knee 04/18/2017   Menorrhagia 11/04/2016   Fibroid tumor 11/04/2016   Adenomyosis 11/04/2016   Hypertension, essential 11/04/2016   Total knee replacement status 01/28/2016    Claris Pong, PT 08/02/2021, 12:00 PM  [facility] @ [facility] [place], [place], [address] Phone: [phone]   Fax:  [phone]  Name: Hannah [name] MRN: [id number] Date of Birth: 1961/04/22

## 2021-08-02 NOTE — Patient Instructions (Signed)
Access Code: [email or web address] ?URL: [email or web address] ?Date: 08/02/2021 ?Prepared by: Cheral Almas ? ?Exercises ?Side Stepping with Resistance at Ankles - 1 x daily - 3 x weekly - 3 sets - 10 reps ?Forward Monster Walks - 1 x daily - 3 x weekly - 3 sets - 10 reps ?Supine Bridge - 1 x daily - 3 x weekly - 1 sets - 10 reps ? ?

## 2021-08-04 ENCOUNTER — Encounter: Payer: Self-pay | Admitting: Physical Therapy

## 2021-08-04 ENCOUNTER — Ambulatory Visit: Payer: BC Managed Care – PPO | Admitting: Physical Therapy

## 2021-08-04 ENCOUNTER — Other Ambulatory Visit: Payer: Self-pay

## 2021-08-04 DIAGNOSIS — R262 Difficulty in walking, not elsewhere classified: Secondary | ICD-10-CM

## 2021-08-04 DIAGNOSIS — C9000 Multiple myeloma not having achieved remission: Secondary | ICD-10-CM

## 2021-08-04 DIAGNOSIS — M6281 Muscle weakness (generalized): Secondary | ICD-10-CM | POA: Diagnosis not present

## 2021-08-04 DIAGNOSIS — R293 Abnormal posture: Secondary | ICD-10-CM | POA: Diagnosis not present

## 2021-08-04 DIAGNOSIS — R5383 Other fatigue: Secondary | ICD-10-CM | POA: Diagnosis not present

## 2021-08-04 NOTE — Therapy (Signed)
[facility] @ [place], [place], [address] Phone: [phone]   Fax:  [phone]  Physical Therapy Treatment  Patient Details  Name: Hannah [name] MRN: [id number] Date of Birth: 01-31-1961 Referring Provider (PT): Cira Rue   Encounter Date: 08/04/2021   PT End of Session - 08/04/21 1011     Visit Number 50    Number of Visits 50    Date for PT Re-Evaluation 09/06/21    Authorization Type BCBS    PT Start Time 0845    PT Stop Time 0930    PT Time Calculation (min) 45 min    Activity Tolerance Patient tolerated treatment well    Behavior During Therapy [facility] for tasks assessed/performed             Past Medical History:  Diagnosis Date   Allergy    Anemia    Anxiety    Arthritis    Asthma    Depression    Hypertension     Past Surgical History:  Procedure Laterality Date   CHOLECYSTECTOMY  1993   COLONOSCOPY     TOTAL KNEE ARTHROPLASTY Right 01/28/2016   TOTAL KNEE ARTHROPLASTY Right 01/28/2016   Procedure: RIGHT TOTAL KNEE ARTHROPLASTY;  Surgeon: [name], MD;  Location: [facility];  Service: Orthopedics;  Laterality: Right;   TRIGGER FINGER RELEASE Right 06/18/2014   Procedure: RIGHT LONG FINGER TRIGGER RELEASE;  Surgeon: [name], MD;  Location: [facility];  Service: Orthopedics;  Laterality: Right;   TUBAL LIGATION     VAGINAL HYSTERECTOMY Bilateral 11/04/2016   Procedure: HYSTERECTOMY VAGINAL with Bilateral Salpingectomy;  Surgeon: [name], MD;  Location: [facility];  Service: Gynecology;  Laterality: Bilateral;   WEIL OSTEOTOMY Right 07/07/2020   Procedure: RIGHT FOOT WEIL OSTEOTOMY 2, 3, AND 4 METATARSALS AND PROXIMAL INTERPHALANGEAL JOINT FUSION 2 & 4 TOES;  Surgeon: Newt Minion, MD;  Location: [facility];  Service: Orthopedics;  Laterality: Right;    There were no vitals filed for this visit.   Subjective Assessment - 08/04/21 1012      Subjective Whoo, Shirlean Mylar worked me Monday. It was good but I was tired and had some soreness.    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 She as diagnosed in February 2022 with Multiple Myeloma not in remission. 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 No/denies            Treatment: Pt arrives for aquatic physical therapy. Treatment took place in 3.5-5.5 feet of water. Water temperature was 93 degrees F. Pt entered the pool via stairs with mild use of the rails. Pt requires buoyancy of water for support and to offload joints with strengthening exercises.  Pt utilizes viscosity of the water required for strengthening.   Standing in 50%- 75% depth water pt performed water walking in all 4 directions 10x. VC for speed in order to generate appropriate current for resistance and/or UE movements. Hand paddles added for side stepping.  Yellow noodle push/pull forward/backward walking.  Wall exercises to inc: Hip 3 ways 20x ea with min support of pool wall. VC to push/pull against water with a force appropriate. Core contractions in partial wall squat with blue neck pillow 5 sec hold 10x.Push single buoy weight underwater and hold 30 sec 4x, then added  high knee marching across the pool while holding wts underwater 4x. Hand paddles for shoulder ext/flex 2x10, horizontal add/abd 2x 10, VC to maintain core throughout exercises.Underwater bicycle in horseback using large noodle for 10 min.                             PT Long Term Goals - 07/12/21 0850       PT LONG TERM GOAL #1   Title Pt will return to the gym    Baseline not yet, but improving and concerned about covid/fluid    Status On-going      PT LONG TERM GOAL #2   Title Pt will improve 52mn walk test to at least 10099fto demonstrate decreased fall risk    Baseline 643, 1061 today    Time  8    Period Weeks    Status Achieved    Target Date 05/18/21      PT LONG TERM GOAL #3   Title Pt will be able to perform a 5 x sts in 20 sec or less showing improved LE strength and    Time 8    Period Weeks    Status Achieved    Target Date 05/18/21      PT LONG TERM GOAL #4   Title Pt will be able to go up and down stairs at home    Time 8    Period Weeks    Status Achieved    Target Date 12/14/20      PT LONG TERM GOAL #5   Title Able to hold tandem stance and SLS for 10 seconds.    Baseline 35 sec tandem each side stopped by PT, Right SLS 10, left 22    Time 8    Period Weeks    Status Achieved    Target Date 07/12/21      PT LONG TERM GOAL #6   Title Able to walk 1250 ft in 6 min to demonstrate improved gait speed    Baseline walked 772 feet but had to stop early secondary to weakness in legs    Period Weeks    Status On-going    Target Date 09/06/21      PT LONG TERM GOAL #7   Title Pt will improve right shoulder ROM for flexion and abd to atleast 110 degrees for improved ability to reach.    Time 8    Period Weeks    Status New    Target Date 09/06/21      PT LONG TERM GOAL #8   Title Pt will improve bilateral grip strength to atleast 30 # for improved function    Time 8    Period Weeks    Status New    Target Date 09/06/21                   Plan - 08/04/21 1516     Clinical Impression Statement P able to participate in 45 min aquatic PT session without any rest breaks and able to hold a conversation the entire time with minimal to no fatigue. Pt can hold buoyant object underwater 30 sec today, multiple times in a row co-contracting multiple muscle groups at th esame time.    Personal Factors and Comorbidities Comorbidity 3+    Comorbidities Multiple Myeloma, multi joint OA, recent foot surgery, fall risk,SCT    Examination-Activity Limitations Sleep;Squat;Stand;Stairs;Transfers;Locomotion Level;Sit    Stability/Clinical Decision Making  Stable/Uncomplicated    Rehab Potential Good    PT Frequency 3x / week    PT Duration 8 weeks    PT Treatment/Interventions ADLs/Self Care Home Management;Aquatic Therapy;Gait training;Stair training;Functional mobility training;Therapeutic activities;Therapeutic exercise;Balance training;Neuromuscular re-education;Manual techniques;Patient/family education;Passive range of motion;Joint Manipulations    PT Next Visit Plan STM to right shoulder,continue aquatic therapy, stab exs, shoulder ROM, add grip strength, endurance    Consulted and Agree with Plan of Care Patient             Patient will benefit from skilled therapeutic intervention in order to improve the following deficits and impairments:     Visit Diagnosis: Abnormal posture  Multiple myeloma not having achieved remission (HCC)  Difficulty in walking, not elsewhere classified  Muscle weakness (generalized)     Problem List Patient Active Problem List   Diagnosis Date Noted   Multiple myeloma ([place]) 07/13/2020   Claw toe, right    Trigger finger, left middle finger 04/09/2020   Stenosing tenosynovitis of finger of left hand 02/27/2020   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   Major depression, recurrent ([place]) 01/12/2018   Chronic right shoulder pain 12/20/2017   MDD (major depressive disorder), recurrent episode, moderate ([place]) 11/04/2017   MGUS (monoclonal gammopathy of unknown significance) 10/24/2017   Primary osteoarthritis of both hands 09/26/2017   Primary osteoarthritis of right shoulder 09/26/2017   Primary osteoarthritis of both hips 09/26/2017   Status post total knee replacement, right 09/26/2017   Primary osteoarthritis of both feet 09/26/2017   History of asthma 09/26/2017   Primary osteoarthritis of left knee 04/18/2017   Menorrhagia 11/04/2016   Fibroid tumor 11/04/2016   Adenomyosis 11/04/2016   Hypertension, essential 11/04/2016   Total knee replacement status 01/28/2016     COCHRAN,JENNIFER, PTA 08/04/2021, 3:19 PM  [facility] @ [place], [place], [address] Phone: [phone]   Fax:  [phone]  Name: Hannah [name] MRN: [id number] Date of Birth: September 01, 1960

## 2021-08-09 ENCOUNTER — Telehealth: Payer: Self-pay | Admitting: *Deleted

## 2021-08-09 ENCOUNTER — Ambulatory Visit: Payer: BC Managed Care – PPO

## 2021-08-09 ENCOUNTER — Other Ambulatory Visit: Payer: Self-pay

## 2021-08-09 DIAGNOSIS — R5383 Other fatigue: Secondary | ICD-10-CM | POA: Diagnosis not present

## 2021-08-09 DIAGNOSIS — R262 Difficulty in walking, not elsewhere classified: Secondary | ICD-10-CM | POA: Diagnosis not present

## 2021-08-09 DIAGNOSIS — M6281 Muscle weakness (generalized): Secondary | ICD-10-CM

## 2021-08-09 DIAGNOSIS — R293 Abnormal posture: Secondary | ICD-10-CM

## 2021-08-09 DIAGNOSIS — C9000 Multiple myeloma not having achieved remission: Secondary | ICD-10-CM | POA: Diagnosis not present

## 2021-08-09 NOTE — Therapy (Signed)
[facility] @ [place], [place], [address] Phone: [phone]   Fax:  [phone]  Physical Therapy Treatment  Patient Details  Name: Hannah [name] MRN: [id number] Date of Birth: 07-19-1960 Referring Provider (PT): Cira Rue   Encounter Date: 08/09/2021   PT End of Session - 08/09/21 0804     Visit Number 33    Number of Visits 56    Date for PT Re-Evaluation 09/06/21    Authorization Type BCBS    PT Start Time 0806    PT Stop Time 0857    PT Time Calculation (min) 51 min    Activity Tolerance Patient tolerated treatment well    Behavior During Therapy [facility] for tasks assessed/performed             Past Medical History:  Diagnosis Date   Allergy    Anemia    Anxiety    Arthritis    Asthma    Depression    Hypertension     Past Surgical History:  Procedure Laterality Date   CHOLECYSTECTOMY  1993   COLONOSCOPY     TOTAL KNEE ARTHROPLASTY Right 01/28/2016   TOTAL KNEE ARTHROPLASTY Right 01/28/2016   Procedure: RIGHT TOTAL KNEE ARTHROPLASTY;  Surgeon: [name], MD;  Location: [facility];  Service: Orthopedics;  Laterality: Right;   TRIGGER FINGER RELEASE Right 06/18/2014   Procedure: RIGHT LONG FINGER TRIGGER RELEASE;  Surgeon: [name], MD;  Location: [facility];  Service: Orthopedics;  Laterality: Right;   TUBAL LIGATION     VAGINAL HYSTERECTOMY Bilateral 11/04/2016   Procedure: HYSTERECTOMY VAGINAL with Bilateral Salpingectomy;  Surgeon: [name], MD;  Location: [facility];  Service: Gynecology;  Laterality: Bilateral;   WEIL OSTEOTOMY Right 07/07/2020   Procedure: RIGHT FOOT WEIL OSTEOTOMY 2, 3, AND 4 METATARSALS AND PROXIMAL INTERPHALANGEAL JOINT FUSION 2 & 4 TOES;  Surgeon: Newt Minion, MD;  Location: [facility];  Service: Orthopedics;  Laterality: Right;    There were no vitals filed for this visit.   Subjective Assessment - 08/09/21 0806      Subjective My shoulder feels really tight today.  I was sore in my muscles after last visit but it was challenging and felt good.    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 She as diagnosed in February 2022 with Multiple Myeloma not in remission. 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 no limits    How long can you stand comfortably? 47mn    How long can you walk comfortably? 528m    Diagnostic tests no scans scheduled recently    Patient Stated Goals get strong, build up the muscles    Currently in Pain? Yes    Pain Score 1     Pain Location Back    Pain Orientation Lower;Right;Left    Pain Descriptors / Indicators Aching    Pain Type Chronic pain    Pain Onset More than a month ago    Pain Frequency Intermittent    Pain Score 4    Pain Location Shoulder    Pain Orientation Right    Pain Descriptors / Indicators Aching    Pain Type Chronic pain    Pain Onset More than a month ago    Pain  Frequency Constant                               [facility] Adult PT Treatment/Exercise - 08/09/21 0001       Exercises   Other Exercises  Nu sept seat and UE 9, Lev 6 x 6 min, Scapular retraction and shoulder extension red band x 10 standing, lateral band walks and monster walks 2 laps x 10 steps with yellow band, 5 in step ups x 10 B, finger walks at wall x 3 flexion and 3 scaption, airex step and hold x 10 ea, ppt 2 x 10,SLR 2x5 B, bilateral piriformis stretch 2 x 20 sec      Manual Therapy   Soft tissue mobilization to right UT, anterior , posterior and lateral shoulder with cocoa butter.                          PT Long Term Goals - 07/12/21 0850       PT LONG TERM GOAL #1   Title Pt will return to the gym    Baseline not yet, but improving and concerned about covid/fluid    Status  On-going      PT LONG TERM GOAL #2   Title Pt will improve 63mn walk test to at least 10084fto demonstrate decreased fall risk    Baseline 643, 1061 today    Time 8    Period Weeks    Status Achieved    Target Date 05/18/21      PT LONG TERM GOAL #3   Title Pt will be able to perform a 5 x sts in 20 sec or less showing improved LE strength and    Time 8    Period Weeks    Status Achieved    Target Date 05/18/21      PT LONG TERM GOAL #4   Title Pt will be able to go up and down stairs at home    Time 8    Period Weeks    Status Achieved    Target Date 12/14/20      PT LONG TERM GOAL #5   Title Able to hold tandem stance and SLS for 10 seconds.    Baseline 35 sec tandem each side stopped by PT, Right SLS 10, left 22    Time 8    Period Weeks    Status Achieved    Target Date 07/12/21      PT LONG TERM GOAL #6   Title Able to walk 1250 ft in 6 min to demonstrate improved gait speed    Baseline walked 772 feet but had to stop early secondary to weakness in legs    Period Weeks    Status On-going    Target Date 09/06/21      PT LONG TERM GOAL #7   Title Pt will improve right shoulder ROM for flexion and abd to atleast 110 degrees for improved ability to reach.    Time 8    Period Weeks    Status New    Target Date 09/06/21      PT LONG TERM GOAL #8   Title Pt will improve bilateral grip strength to atleast 30 # for improved function    Time 8    Period Weeks    Status New    Target Date 09/06/21  Plan - 08/09/21 0804     Clinical Impression Statement Pts shoulder was very achy today so performed soft tissue mobilization to right shoulder prior to exercising which did help the achiness. Perofrmed standing strength and balance exercises and supine core strength. Pt fatigues with band walks, but overall demonstrates excellent improvement in strength and balance. Shoulder felt better after exercise however, effects of recent injection are  wearing off.    Personal Factors and Comorbidities Comorbidity 3+    Comorbidities Multiple Myeloma, multi joint OA, recent foot surgery, fall risk,SCT    Examination-Activity Limitations Sleep;Squat;Stand;Stairs;Transfers;Locomotion Level;Sit    Examination-Participation Restrictions Cleaning;Community Activity;Occupation;Shop    Stability/Clinical Decision Making Stable/Uncomplicated    Rehab Potential Good    PT Frequency 3x / week    PT Duration 8 weeks    PT Treatment/Interventions ADLs/Self Care Home Management;Aquatic Therapy;Gait training;Stair training;Functional mobility training;Therapeutic activities;Therapeutic exercise;Balance training;Neuromuscular re-education;Manual techniques;Patient/family education;Passive range of motion;Joint Manipulations    PT Next Visit Plan STM to right shoulder,continue aquatic therapy, stab exs, shoulder ROM, add grip strength, endurance    PT Home Exercise Plan mini squats, sidestepping, step ups,stretches,ppt,    Consulted and Agree with Plan of Care Patient             Patient will benefit from skilled therapeutic intervention in order to improve the following deficits and impairments:  Abnormal gait, Decreased mobility, Decreased activity tolerance, Decreased endurance, Decreased strength, Decreased balance, Decreased knowledge of precautions, Difficulty walking, Postural dysfunction, Pain  Visit Diagnosis: Abnormal posture  Multiple myeloma not having achieved remission (HCC)  Difficulty in walking, not elsewhere classified  Muscle weakness (generalized)     Problem List Patient Active Problem List   Diagnosis Date Noted   Multiple myeloma ([place]) 07/13/2020   Claw toe, right    Trigger finger, left middle finger 04/09/2020   Stenosing tenosynovitis of finger of left hand 02/27/2020   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   Major depression, recurrent ([place]) 01/12/2018   Chronic right shoulder pain 12/20/2017   MDD (major  depressive disorder), recurrent episode, moderate ([place]) 11/04/2017   MGUS (monoclonal gammopathy of unknown significance) 10/24/2017   Primary osteoarthritis of both hands 09/26/2017   Primary osteoarthritis of right shoulder 09/26/2017   Primary osteoarthritis of both hips 09/26/2017   Status post total knee replacement, right 09/26/2017   Primary osteoarthritis of both feet 09/26/2017   History of asthma 09/26/2017   Primary osteoarthritis of left knee 04/18/2017   Menorrhagia 11/04/2016   Fibroid tumor 11/04/2016   Adenomyosis 11/04/2016   Hypertension, essential 11/04/2016   Total knee replacement status 01/28/2016    Claris Pong, PT 08/09/2021, 9:02 AM  [facility] @ [place], [place], [address] Phone: [phone]   Fax:  [phone]  Name: Hannah [name] MRN: [id number] Date of Birth: 01-26-1961

## 2021-08-09 NOTE — Telephone Encounter (Signed)
08/06/2021 at 11:04 am  ?Budd Palmer, Senior Claims, Examiner, Claims [phone] Ext. [phone] of [place] faxed request for medical records from Oct 08, 2020, through present.  Reads "Attachments: [id number]/Claimant-AUTHORIZATION-[email or web address]."  Page reads Cam Scanner sent via [employer] Note 10, an [employer] 5G evolution capable smart phone is an 8 mm x 8 mm image which is not a usable attachment and request addressed to individual provider. ? ?Unable to connect with Building control surveyor.  Message left requesting sending new request to main fax no#: [phone] or [phone], addressed to legal name "Edgar" or [place] attention: Dr. Burr Medico".  Attachment not viewable for use.  If 10/06/2020 is the date Shawna Orleans received patient authorization, a newly signed release is required to receive records beyond date of patient signature.      ?

## 2021-08-11 ENCOUNTER — Other Ambulatory Visit: Payer: Self-pay

## 2021-08-11 ENCOUNTER — Encounter: Payer: Self-pay | Admitting: Physical Therapy

## 2021-08-11 ENCOUNTER — Ambulatory Visit: Payer: BC Managed Care – PPO | Admitting: Physical Therapy

## 2021-08-11 DIAGNOSIS — R262 Difficulty in walking, not elsewhere classified: Secondary | ICD-10-CM

## 2021-08-11 DIAGNOSIS — R293 Abnormal posture: Secondary | ICD-10-CM | POA: Diagnosis not present

## 2021-08-11 DIAGNOSIS — R5383 Other fatigue: Secondary | ICD-10-CM | POA: Diagnosis not present

## 2021-08-11 DIAGNOSIS — C9000 Multiple myeloma not having achieved remission: Secondary | ICD-10-CM | POA: Diagnosis not present

## 2021-08-11 DIAGNOSIS — M6281 Muscle weakness (generalized): Secondary | ICD-10-CM | POA: Diagnosis not present

## 2021-08-11 NOTE — Therapy (Signed)
[facility] ?[facility] [place] ?[place], [place], [address] ?Phone: [phone]   Fax:  [phone] ? ?Physical Therapy Treatment ? ?Patient Details  ?Name: Hannah [name] ?MRN: [id number] ?Date of Birth: Jul 18, 1960 ?Referring Provider (PT): Cira Rue ? ? ?Encounter Date: 08/11/2021 ? ? PT End of Session - 08/11/21 0843   ? ? Visit Number 62   ? Number of Visits 69   ? Date for PT Re-Evaluation 09/06/21   ? Authorization Type BCBS   ? PT Stop Time [phone]   ? Activity Tolerance Patient tolerated treatment well   ? Behavior During Therapy [facility] for tasks assessed/performed   ? ?  ?  ? ?  ? ? ?Past Medical History:  ?Diagnosis Date  ? Allergy   ? Anemia   ? Anxiety   ? Arthritis   ? Asthma   ? Depression   ? Hypertension   ? ? ?Past Surgical History:  ?Procedure Laterality Date  ? CHOLECYSTECTOMY  1993  ? COLONOSCOPY    ? TOTAL KNEE ARTHROPLASTY Right 01/28/2016  ? TOTAL KNEE ARTHROPLASTY Right 01/28/2016  ? Procedure: RIGHT TOTAL KNEE ARTHROPLASTY;  Surgeon: [name], MD;  Location: [place];  Service: Orthopedics;  Laterality: Right;  ? TRIGGER FINGER RELEASE Right 06/18/2014  ? Procedure: RIGHT LONG FINGER TRIGGER RELEASE;  Surgeon: [name], MD;  Location: [facility];  Service: Orthopedics;  Laterality: Right;  ? TUBAL LIGATION    ? VAGINAL HYSTERECTOMY Bilateral 11/04/2016  ? Procedure: HYSTERECTOMY VAGINAL with Bilateral Salpingectomy;  Surgeon: [name], MD;  Location: [facility];  Service: Gynecology;  Laterality: Bilateral;  ? WEIL OSTEOTOMY Right 07/07/2020  ? Procedure: RIGHT FOOT WEIL OSTEOTOMY 2, 3, AND 4 METATARSALS AND PROXIMAL INTERPHALANGEAL JOINT FUSION 2 & 4 TOES;  Surgeon: Newt Minion, MD;  Location: [facility];  Service: Orthopedics;  Laterality: Right;  ? ? ?There were no vitals filed for this visit. ? ? Subjective Assessment - 08/11/21 0844   ? ? Subjective Good workout last time.   ? 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 She as diagnosed in February 2022 with Multiple Myeloma not in remission. 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 Currently in Pain? No/denies   ? ?  ?  ? ?  ? ? ?Treatment: Pt arrives for aquatic physical therapy. Treatment took place in 3.5-5.5 feet of water. Water temperature was 92 degrees F. Pt entered the pool via steps with mild use of rails. Pt requires buoyancy of water for support and to offload joints with strengthening exercises. Pt utilizes viscosity of the water required for strengthening.  ? ?Standing in 50%- 75% depth water pt performed water walking in all 4 directions 10x. VC for speed in order to generate appropriate current for resistance and/or UE movements. Hand paddles added for side stepping. Yellow noodle for push/pull, concurrent discussion of status.  ? ?Wall exercises to inc: Hip 3 ways 20x ea with min support of pool wall adding ankle cuff weight for buoyant resistance. VC to push/pull against water with a force appropriate. Core contractions in partial wall squat with neck pillow 5 sec hold 10x.  Hold buoynat object underwater for 45 sec 4x, then knee marching with buoyant  object 4 lengths. Hand paddles for shoulder ext/flex 2x10, horizontal add/abd 2x 10, VC to maintain  core throughout exercise. Underwater bicycle using large noodle for 10 min.  ? ? ? ? ? ? ? ? ? ? ? ? ? ? ? ? ? ? ? ? ? ? ? ? ? ? ? ? ? ? ? PT Long Term Goals - 07/12/21 0850   ? ?  ? PT LONG TERM GOAL #1  ? Title Pt will return to the gym   ? Baseline not yet, but improving and concerned about covid/fluid   ? Status On-going   ?  ? PT LONG TERM GOAL #2  ? Title Pt will improve 12mn walk test to at least 10096fto demonstrate decreased fall risk   ? Baseline 643, 1061 today   ? Time 8   ? Period Weeks   ? Status Achieved   ? Target Date 05/18/21   ?  ? PT LONG TERM GOAL #3  ? Title Pt  will be able to perform a 5 x sts in 20 sec or less showing improved LE strength and   ? Time 8   ? Period Weeks   ? Status Achieved   ? Target Date 05/18/21   ?  ? PT LONG TERM GOAL #4  ? Title Pt will be able to go up and down stairs at home   ? Time 8   ? Period Weeks   ? Status Achieved   ? Target Date 12/14/20   ?  ? PT LONG TERM GOAL #5  ? Title Able to hold tandem stance and SLS for 10 seconds.   ? Baseline 35 sec tandem each side stopped by PT, Right SLS 10, left 22   ? Time 8   ? Period Weeks   ? Status Achieved   ? Target Date 07/12/21   ?  ? PT LONG TERM GOAL #6  ? Title Able to walk 1250 ft in 6 min to demonstrate improved gait speed   ? Baseline walked 772 feet but had to stop early secondary to weakness in legs   ? Period Weeks   ? Status On-going   ? Target Date 09/06/21   ?  ? PT LONG TERM GOAL #7  ? Title Pt will improve right shoulder ROM for flexion and abd to atleast 110 degrees for improved ability to reach.   ? Time 8   ? Period Weeks   ? Status New   ? Target Date 09/06/21   ?  ? PT LONG TERM GOAL #8  ? Title Pt will improve bilateral grip strength to atleast 30 # for improved function   ? Time 8   ? Period Weeks   ? Status New   ? Target Date 09/06/21   ? ?  ?  ? ?  ? ? ? ? ? ? ? ? Plan - 08/11/21 0844   ? ? Personal Factors and Comorbidities Comorbidity 3+   ? Comorbidities Multiple Myeloma, multi joint OA, recent foot surgery, fall risk,SCT   ? Examination-Activity Limitations Sleep;Squat;Stand;Stairs;Transfers;Locomotion Level;Sit   ? Examination-Participation Restrictions Cleaning;Community Activity;Occupation;Shop   ? Rehab Potential Good   ? PT Frequency 3x / week   ? PT Duration 8 weeks   ? PT Treatment/Interventions ADLs/Self Care Home Management;Aquatic Therapy;Gait training;Stair training;Functional mobility training;Therapeutic activities;Therapeutic exercise;Balance training;Neuromuscular re-education;Manual techniques;Patient/family education;Passive range of motion;Joint  Manipulations   ? PT Next Visit Plan STM to right shoulder,continue aquatic therapy, stab exs, shoulder ROM, add grip strength, endurance   ? Consulted and Agree with Plan of  Care Patient   ? ?  ?  ? ?  ? ? ?Patient will benefit from skilled therapeutic intervention in order to improve the following deficits and impairments:    ? ?Visit Diagnosis: ?Abnormal posture ? ?Multiple myeloma not having achieved remission ([place]) ? ?Difficulty in walking, not elsewhere classified ? ?Muscle weakness (generalized) ? ? ? ? ?Problem List ?Patient Active Problem List  ? Diagnosis Date Noted  ? Multiple myeloma ([place]) 07/13/2020  ? Claw toe, right   ? Trigger finger, left middle finger 04/09/2020  ? Stenosing tenosynovitis of finger of left hand 02/27/2020  ? 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 Major depression, recurrent ([place]) 01/12/2018  ? Chronic right shoulder pain 12/20/2017  ? MDD (major depressive disorder), recurrent episode, moderate ([place]) 11/04/2017  ? MGUS (monoclonal gammopathy of unknown significance) 10/24/2017  ? Primary osteoarthritis of both hands 09/26/2017  ? Primary osteoarthritis of right shoulder 09/26/2017  ? Primary osteoarthritis of both hips 09/26/2017  ? Status post total knee replacement, right 09/26/2017  ? Primary osteoarthritis of both feet 09/26/2017  ? History of asthma 09/26/2017  ? Primary osteoarthritis of left knee 04/18/2017  ? Menorrhagia 11/04/2016  ? Fibroid tumor 11/04/2016  ? Adenomyosis 11/04/2016  ? Hypertension, essential 11/04/2016  ? Total knee replacement status 01/28/2016  ? ? ?Morse Brueggemann, PTA ?08/11/2021, 8:46 AM ? ? ?[facility] [place] ?[place], [place], [address] ?Phone: [phone]   Fax:  [phone] ? ?Name: Hannah [name] ?MRN: [id number] ?Date of Birth: 29-Jan-1961 ? ? ? ?

## 2021-08-12 ENCOUNTER — Telehealth: Payer: Self-pay

## 2021-08-12 NOTE — Telephone Encounter (Signed)
Notified Patient of request for medical records from [facility] relative to [place] [phone]. Informed Patient that an updated and signed Release of Information Form was needed and provided Patient with form via e-mail provided ([email or web address]). Awaiting return of signed form to forward request to [place] Management Department. ?

## 2021-08-13 ENCOUNTER — Ambulatory Visit: Payer: BC Managed Care – PPO | Admitting: Physical Therapy

## 2021-08-17 ENCOUNTER — Telehealth: Payer: Self-pay

## 2021-08-17 DIAGNOSIS — C9000 Multiple myeloma not having achieved remission: Secondary | ICD-10-CM | POA: Diagnosis not present

## 2021-08-17 DIAGNOSIS — Z9484 Stem cells transplant status: Secondary | ICD-10-CM | POA: Diagnosis not present

## 2021-08-17 DIAGNOSIS — Z23 Encounter for immunization: Secondary | ICD-10-CM | POA: Diagnosis not present

## 2021-08-17 NOTE — Therapy (Signed)
[facility] ?[facility] [place] ?[place], [place], [address] ?Phone: [phone]   Fax:  [phone] ? ?Physical Therapy Treatment ? ?Patient Details  ?Name: Hannah [name] ?MRN: [id number] ?Date of Birth: 08-Apr-1961 ?Referring Provider (PT): Cira Rue ? ? ?Encounter Date: 08/18/2021 ? ? PT End of Session - 08/18/21 0802   ? ? Visit Number 61   ? Number of Visits 69   ? Date for PT Re-Evaluation 09/06/21   ? Authorization Type BCBS   ? PT Start Time 0801   ? PT Stop Time 4132   ? PT Time Calculation (min) 48 min   ? Activity Tolerance Patient tolerated treatment well   ? Behavior During Therapy [facility] for tasks assessed/performed   ? ?  ?  ? ?  ? ? ? ?Past Medical History:  ?Diagnosis Date  ? Allergy   ? Anemia   ? Anxiety   ? Arthritis   ? Asthma   ? Depression   ? Hypertension   ? ? ?Past Surgical History:  ?Procedure Laterality Date  ? CHOLECYSTECTOMY  1993  ? COLONOSCOPY    ? TOTAL KNEE ARTHROPLASTY Right 01/28/2016  ? TOTAL KNEE ARTHROPLASTY Right 01/28/2016  ? Procedure: RIGHT TOTAL KNEE ARTHROPLASTY;  Surgeon: [name], MD;  Location: [facility];  Service: Orthopedics;  Laterality: Right;  ? TRIGGER FINGER RELEASE Right 06/18/2014  ? Procedure: RIGHT LONG FINGER TRIGGER RELEASE;  Surgeon: [name], MD;  Location: [facility];  Service: Orthopedics;  Laterality: Right;  ? TUBAL LIGATION    ? VAGINAL HYSTERECTOMY Bilateral 11/04/2016  ? Procedure: HYSTERECTOMY VAGINAL with Bilateral Salpingectomy;  Surgeon: [name], MD;  Location: [facility];  Service: Gynecology;  Laterality: Bilateral;  ? WEIL OSTEOTOMY Right 07/07/2020  ? Procedure: RIGHT FOOT WEIL OSTEOTOMY 2, 3, AND 4 METATARSALS AND PROXIMAL INTERPHALANGEAL JOINT FUSION 2 & 4 TOES;  Surgeon: Newt Minion, MD;  Location: [facility];  Service: Orthopedics;  Laterality: Right;  ? ? ?There were no vitals filed for this visit. ? ? Subjective Assessment - 08/18/21 0803    ? ? Subjective Very tired. My husband lost his vision last Friday and we have been back and forth to [facility] almost every day.   ? 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 She as diagnosed in February 2022 with Multiple Myeloma not in remission. 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 Currently in Pain? No/denies   ? ?  ?  ? ?  ?08/18/21 Treatment: Pt arrives for aquatic physical therapy. Treatment took place in 3.5-5.5 feet of water. Water temperature was 92 degrees F. Pt entered the pool via stairs with mild use of the rails.Pt requires buoyancy of water for support and to offload joints with strengthening exercises.  Pt utilizes viscosity of the water required for strengthening. Seated water bench with 75% submersion ?Pt performed seated LE AROM exercises 20x in all planes,  ? ?Standing in 50%- 75% depth water pt performed water walking in all 4 directions 10x. VC to move at pace that is comfortable for pt's current status. Hand paddles added for side stepping and yellow noodle for UE push/pull. ? ?Wall exercises to inc: Hip 3 ways 20x ea with min support of pool wall. VC to push/pull against water with a force appropriate. High knee marching with water weights  held under water 4x. Hand paddles for shoulder ext/flex 2x10, horizontal add/abd 2x 10, VC to maintain core throughout exercises. Underwater bicycle with large noodle behind pt for min 2 min. Decompression float 4 min ( 2 separate times) with noodle for fatigue.   ? ?08/11/21: ?Treatment: Pt arrives for aquatic physical therapy. Treatment took place in 3.5-5.5 feet of water. Water temperature was 92 degrees F. Pt entered the pool via steps with mild use of rails. Pt requires buoyancy of water for support and to offload joints with strengthening exercises. Pt utilizes viscosity of the water required for strengthening.  ? ?Standing in 50%- 75%  depth water pt performed water walking in all 4 directions 10x. VC for speed in order to generate appropriate current for resistance and/or UE movements. Hand paddles added for side stepping. Yellow noodle for push/pull, concurrent discussion of status.  ? ?Wall exercises to inc: Hip 3 ways 20x ea with min support of pool wall adding ankle cuff weight for buoyant resistance. VC to push/pull against water with a force appropriate. Core contractions in partial wall squat with neck pillow 5 sec hold 10x.  Hold buoynat object underwater for 45 sec 4x, then knee marching with buoyant  object 4 lengths. Hand paddles for shoulder ext/flex 2x10, horizontal add/abd 2x 10, VC to maintain core throughout exercise. Underwater bicycle using large noodle for 10 min.  ? ? ? ? ? ? ? ? ? ? ? ? ? ? ? ? ? ? ? ? ? ? ? ? ? ? ? ? ? ? ? PT Long Term Goals - 07/12/21 0850   ? ?  ? PT LONG TERM GOAL #1  ? Title Pt will return to the gym   ? Baseline not yet, but improving and concerned about covid/fluid   ? Status On-going   ?  ? PT LONG TERM GOAL #2  ? Title Pt will improve 14min walk test to at least 1063ft to demonstrate decreased fall risk   ? Baseline 643, 1061 today   ? Time 8   ? Period Weeks   ? Status Achieved   ? Target Date 05/18/21   ?  ? PT LONG TERM GOAL #3  ? Title Pt will be able to perform a 5 x sts in 20 sec or less showing improved LE strength and   ? Time 8   ? Period Weeks   ? Status Achieved   ? Target Date 05/18/21   ?  ? PT LONG TERM GOAL #4  ? Title Pt will be able to go up and down stairs at home   ? Time 8   ? Period Weeks   ? Status Achieved   ? Target Date 12/14/20   ?  ? PT LONG TERM GOAL #5  ? Title Able to hold tandem stance and SLS for 10 seconds.   ? Baseline 35 sec tandem each side stopped by PT, Right SLS 10, left 22   ? Time 8   ? Period Weeks   ? Status Achieved   ? Target Date 07/12/21   ?  ? PT LONG TERM GOAL #6  ? Title Able to walk 1250 ft in 6 min to demonstrate improved gait speed   ? Baseline  walked 772 feet but had to stop early secondary to weakness in legs   ? Period Weeks   ? Status On-going   ? Target Date 09/06/21   ?  ? PT LONG TERM GOAL #7  ?  Title Pt will improve right shoulder ROM for flexion and abd to atleast 110 degrees for improved ability to reach.   ? Time 8   ? Period Weeks   ? Status New   ? Target Date 09/06/21   ?  ? PT LONG TERM GOAL #8  ? Title Pt will improve bilateral grip strength to atleast 30 # for improved function   ? Time 8   ? Period Weeks   ? Status New   ? Target Date 09/06/21   ? ?  ?  ? ?  ? ? ? ? ? ? ? ? Plan - 08/18/21 0853   ? ? Clinical Impression Statement Pt arrives to aquatic PT very fatigued. Pt has been taking her husband back and forth to [facility] since last Friday in addition to attending to his medical needs in the home. Pt was able to complete most of her exercises in th water today but in a slower fashion and with longer rest breaks.   ? Personal Factors and Comorbidities Comorbidity 3+   ? Comorbidities Multiple Myeloma, multi joint OA, recent foot surgery, fall risk,SCT   ? Examination-Activity Limitations Sleep;Squat;Stand;Stairs;Transfers;Locomotion Level;Sit   ? Examination-Participation Restrictions Cleaning;Community Activity;Occupation;Shop   ? Stability/Clinical Decision Making Stable/Uncomplicated   ? Rehab Potential Good   ? PT Frequency 3x / week   ? PT Duration 8 weeks   ? PT Treatment/Interventions ADLs/Self Care Home Management;Aquatic Therapy;Gait training;Stair training;Functional mobility training;Therapeutic activities;Therapeutic exercise;Balance training;Neuromuscular re-education;Manual techniques;Patient/family education;Passive range of motion;Joint Manipulations   ? PT Next Visit Plan STM to right shoulder,continue aquatic therapy, stab exs, shoulder ROM, add grip strength, endurance   ? Consulted and Agree with Plan of Care Patient   ? ?  ?  ? ?  ? ? ? ?Patient will benefit from skilled therapeutic intervention in order to improve the  following deficits and impairments:  Abnormal gait, Decreased mobility, Decreased activity tolerance, Decreased endurance, Decreased strength, Decreased balance, Decreased knowledge of precautions, Difficulty w

## 2021-08-17 NOTE — Telephone Encounter (Signed)
Notified Patient of request for medical records from [facility] relative to [place] [phone]. Informed Patient once again that an updated and signed Release of Information Form was needed before [place] Management Department would release medical records to [facility]. ?

## 2021-08-18 ENCOUNTER — Encounter: Payer: Self-pay | Admitting: Physical Therapy

## 2021-08-18 ENCOUNTER — Ambulatory Visit: Payer: BC Managed Care – PPO | Admitting: Physical Therapy

## 2021-08-18 ENCOUNTER — Other Ambulatory Visit: Payer: Self-pay

## 2021-08-18 ENCOUNTER — Telehealth: Payer: Self-pay

## 2021-08-18 DIAGNOSIS — M6281 Muscle weakness (generalized): Secondary | ICD-10-CM | POA: Diagnosis not present

## 2021-08-18 DIAGNOSIS — R293 Abnormal posture: Secondary | ICD-10-CM

## 2021-08-18 DIAGNOSIS — C9000 Multiple myeloma not having achieved remission: Secondary | ICD-10-CM

## 2021-08-18 DIAGNOSIS — R262 Difficulty in walking, not elsewhere classified: Secondary | ICD-10-CM

## 2021-08-18 DIAGNOSIS — R5383 Other fatigue: Secondary | ICD-10-CM | POA: Diagnosis not present

## 2021-08-18 NOTE — Telephone Encounter (Signed)
Notified Patient that request for medical records from [facility] for her Disability claim have been forwarded to [place] Management Department with the signed Release of Information Form. Provided Patient with phone number for HIM. No other needs or concerns voiced at this time. ?

## 2021-08-20 ENCOUNTER — Other Ambulatory Visit: Payer: Self-pay

## 2021-08-20 ENCOUNTER — Other Ambulatory Visit: Payer: Self-pay | Admitting: Hematology

## 2021-08-20 ENCOUNTER — Inpatient Hospital Stay: Payer: BC Managed Care – PPO | Attending: Hematology

## 2021-08-20 ENCOUNTER — Inpatient Hospital Stay: Payer: BC Managed Care – PPO

## 2021-08-20 VITALS — BP 124/88 | HR 85 | Resp 18

## 2021-08-20 DIAGNOSIS — C9000 Multiple myeloma not having achieved remission: Secondary | ICD-10-CM | POA: Insufficient documentation

## 2021-08-20 DIAGNOSIS — Z9484 Stem cells transplant status: Secondary | ICD-10-CM | POA: Insufficient documentation

## 2021-08-20 DIAGNOSIS — F419 Anxiety disorder, unspecified: Secondary | ICD-10-CM | POA: Insufficient documentation

## 2021-08-20 DIAGNOSIS — Z9049 Acquired absence of other specified parts of digestive tract: Secondary | ICD-10-CM | POA: Diagnosis not present

## 2021-08-20 DIAGNOSIS — I10 Essential (primary) hypertension: Secondary | ICD-10-CM | POA: Insufficient documentation

## 2021-08-20 DIAGNOSIS — Z79899 Other long term (current) drug therapy: Secondary | ICD-10-CM | POA: Insufficient documentation

## 2021-08-20 DIAGNOSIS — Z9079 Acquired absence of other genital organ(s): Secondary | ICD-10-CM | POA: Diagnosis not present

## 2021-08-20 DIAGNOSIS — F32A Depression, unspecified: Secondary | ICD-10-CM | POA: Insufficient documentation

## 2021-08-20 DIAGNOSIS — D509 Iron deficiency anemia, unspecified: Secondary | ICD-10-CM | POA: Diagnosis not present

## 2021-08-20 DIAGNOSIS — Z923 Personal history of irradiation: Secondary | ICD-10-CM | POA: Diagnosis not present

## 2021-08-20 DIAGNOSIS — Z882 Allergy status to sulfonamides status: Secondary | ICD-10-CM | POA: Diagnosis not present

## 2021-08-20 DIAGNOSIS — Z7961 Long term (current) use of immunomodulator: Secondary | ICD-10-CM | POA: Insufficient documentation

## 2021-08-20 DIAGNOSIS — E669 Obesity, unspecified: Secondary | ICD-10-CM | POA: Diagnosis not present

## 2021-08-20 DIAGNOSIS — M545 Low back pain, unspecified: Secondary | ICD-10-CM | POA: Insufficient documentation

## 2021-08-20 DIAGNOSIS — M25551 Pain in right hip: Secondary | ICD-10-CM | POA: Insufficient documentation

## 2021-08-20 DIAGNOSIS — M1990 Unspecified osteoarthritis, unspecified site: Secondary | ICD-10-CM | POA: Diagnosis not present

## 2021-08-20 LAB — CBC WITH DIFFERENTIAL (CANCER CENTER ONLY)
Abs Immature Granulocytes: 0.03 10*3/uL (ref 0.00–0.07)
Basophils Absolute: 0 10*3/uL (ref 0.0–0.1)
Basophils Relative: 0 %
Eosinophils Absolute: 0.4 10*3/uL (ref 0.0–0.5)
Eosinophils Relative: 8 %
HCT: 33.6 % — ABNORMAL LOW (ref 36.0–46.0)
Hemoglobin: 11 g/dL — ABNORMAL LOW (ref 12.0–15.0)
Immature Granulocytes: 1 %
Lymphocytes Relative: 30 %
Lymphs Abs: 1.5 10*3/uL (ref 0.7–4.0)
MCH: 23.2 pg — ABNORMAL LOW (ref 26.0–34.0)
MCHC: 32.7 g/dL (ref 30.0–36.0)
MCV: 70.7 fL — ABNORMAL LOW (ref 80.0–100.0)
Monocytes Absolute: 0.4 10*3/uL (ref 0.1–1.0)
Monocytes Relative: 7 %
Neutro Abs: 2.8 10*3/uL (ref 1.7–7.7)
Neutrophils Relative %: 54 %
Platelet Count: 181 10*3/uL (ref 150–400)
RBC: 4.75 MIL/uL (ref 3.87–5.11)
RDW: 18.5 % — ABNORMAL HIGH (ref 11.5–15.5)
WBC Count: 5.2 10*3/uL (ref 4.0–10.5)
nRBC: 0 % (ref 0.0–0.2)

## 2021-08-20 LAB — CMP (CANCER CENTER ONLY)
ALT: 11 U/L (ref 0–44)
AST: 15 U/L (ref 15–41)
Albumin: 3.7 g/dL (ref 3.5–5.0)
Alkaline Phosphatase: 74 U/L (ref 38–126)
Anion gap: 6 (ref 5–15)
BUN: 21 mg/dL — ABNORMAL HIGH (ref 6–20)
CO2: 30 mmol/L (ref 22–32)
Calcium: 8.9 mg/dL (ref 8.9–10.3)
Chloride: 105 mmol/L (ref 98–111)
Creatinine: 0.83 mg/dL (ref 0.44–1.00)
GFR, Estimated: >60 mL/min (ref >60)
Glucose, Bld: 79 mg/dL (ref 70–99)
Potassium: 3.4 mmol/L — ABNORMAL LOW (ref 3.5–5.1)
Sodium: 141 mmol/L (ref 135–145)
Total Bilirubin: 0.3 mg/dL (ref 0.3–1.2)
Total Protein: 7 g/dL (ref 6.5–8.1)

## 2021-08-20 MED ORDER — ZOLEDRONIC ACID 4 MG/100ML IV SOLN
4.0000 mg | Freq: Once | INTRAVENOUS | Status: AC
  Administered 2021-08-20: 4 mg via INTRAVENOUS
  Filled 2021-08-20: qty 100

## 2021-08-24 NOTE — Therapy (Signed)
[facility] ?[facility] ?[place], [place], [address] ?Phone: [phone]   Fax:  [phone] ? ?Physical Therapy Treatment ? ?Patient Details  ?Name: Hannah [name] ?MRN: [id number] ?Date of Birth: Jul 15, 1960 ?Referring Provider (PT): Cira Rue ? ? ?Encounter Date: 08/25/2021 ? ? PT End of Session - 08/25/21 0806   ? ? Visit Number 44   ? Number of Visits 69   ? Date for PT Re-Evaluation 09/06/21   ? Authorization Type BCBS   ? PT Start Time 0801   ? PT Stop Time 0845   ? PT Time Calculation (min) 44 min   ? Activity Tolerance Patient tolerated treatment well   ? Behavior During Therapy [facility] for tasks assessed/performed   ? ?  ?  ? ?  ? ? ? ? ?Past Medical History:  ?Diagnosis Date  ? Allergy   ? Anemia   ? Anxiety   ? Arthritis   ? Asthma   ? Depression   ? Hypertension   ? ? ?Past Surgical History:  ?Procedure Laterality Date  ? CHOLECYSTECTOMY  1993  ? COLONOSCOPY    ? TOTAL KNEE ARTHROPLASTY Right 01/28/2016  ? TOTAL KNEE ARTHROPLASTY Right 01/28/2016  ? Procedure: RIGHT TOTAL KNEE ARTHROPLASTY;  Surgeon: [name], MD;  Location: [facility];  Service: Orthopedics;  Laterality: Right;  ? TRIGGER FINGER RELEASE Right 06/18/2014  ? Procedure: RIGHT LONG FINGER TRIGGER RELEASE;  Surgeon: [name], MD;  Location: [facility];  Service: Orthopedics;  Laterality: Right;  ? TUBAL LIGATION    ? VAGINAL HYSTERECTOMY Bilateral 11/04/2016  ? Procedure: HYSTERECTOMY VAGINAL with Bilateral Salpingectomy;  Surgeon: [name], MD;  Location: [facility];  Service: Gynecology;  Laterality: Bilateral;  ? WEIL OSTEOTOMY Right 07/07/2020  ? Procedure: RIGHT FOOT WEIL OSTEOTOMY 2, 3, AND 4 METATARSALS AND PROXIMAL INTERPHALANGEAL JOINT FUSION 2 & 4 TOES;  Surgeon: Newt Minion, MD;  Location: [facility];  Service: Orthopedics;  Laterality: Right;  ? ? ?There were no vitals filed for this visit. ? ?08/25/21 Treatment: Pt arrives for aquatic  physical therapy. Treatment took place in 3.5-5.5 feet of water. Water temperature was 90 degrees F. Pt entered the pool via stairs with mild use of the rails.Pt requires buoyancy of water for support and to offload joints with strengthening exercises.  Pt utilizes viscosity of the water required for strengthening.  ? ?Standing in 50%- 75% depth water pt performed water walking in all 4 directions 10x. VC to move at pace that is comfortable for pt's current status. Hand paddles added for side stepping and yellow noodle for UE push/pull. ? ? Subjective Assessment - 08/25/21 1508   ? ? Subjective Better week, home life calming down.   ? 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 She as diagnosed in February 2022 with Multiple Myeloma not in remission. 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 Currently in Pain? No/denies   ? ?  ?  ? ?  ?Wall exercises to inc: Hip 3 ways 20x ea with min support of pool wall. VC to push/pull against water with a force appropriate. High knee marching with water weights held under water 4x. Hand paddles for shoulder ext/flex 2x10, horizontal add/abd 2x 10, VC to maintain core throughout exercises. Hip 3 ways with no rest 20x each Bil. 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 Decompression float 2 min ( 2 separate times) with noodle for fatigue ? ?08/18/21 Treatment: Pt arrives for aquatic physical therapy. Treatment took place in 3.5-5.5 feet of water. Water temperature was 92 degrees F. Pt entered the pool via stairs with mild use of the rails.Pt requires buoyancy of water for support and to offload joints with strengthening exercises.  Pt utilizes viscosity of the water required for strengthening. Seated water bench with 75% submersion ?Pt performed seated LE AROM exercises 20x in all planes,  ? ?Standing in 50%- 75% depth water pt performed water walking  in all 4 directions 10x. VC to move at pace that is comfortable for pt's current status. Hand paddles added for side stepping and yellow noodle for UE push/pull. ? ?Wall exercises to inc: Hip 3 ways 20x ea with min support of pool wall. VC to push/pull against water with a force appropriate. High knee marching with water weights held under water 4x. Hand paddles for shoulder ext/flex 2x10, horizontal add/abd 2x 10, VC to maintain core throughout exercises. Underwater bicycle with large noodle behind pt for min 2 min. Decompression float 4 min ( 2 separate times) with noodle for fatigue.   ? ?08/11/21: ?Treatment: Pt arrives for aquatic physical therapy. Treatment took place in 3.5-5.5 feet of water. Water temperature was 92 degrees F. Pt entered the pool via steps with mild use of rails. Pt requires buoyancy of water for support and to offload joints with strengthening exercises. Pt utilizes viscosity of the water required for strengthening.  ? ?Standing in 50%- 75% depth water pt performed water walking in all 4 directions 10x. VC for speed in order to generate appropriate current for resistance and/or UE movements. Hand paddles added for side stepping. Yellow noodle for push/pull, concurrent discussion of status.  ? ?Wall exercises to inc: Hip 3 ways 20x ea with min support of pool wall adding ankle cuff weight for buoyant resistance. VC to push/pull against water with a force appropriate. Core contractions in partial wall squat with neck pillow 5 sec hold 10x.  Hold buoynat object underwater for 45 sec 4x, then knee marching with buoyant  object 4 lengths. Hand paddles for shoulder ext/flex 2x10, horizontal add/abd 2x 10, VC to maintain core throughout exercise. Underwater bicycle using large noodle for 10 min.  ? ? ? ? ? ? ? ? ? ? ? ? ? ? ? ? ? ? ? ? ? ? ? ? ? ? ? ? ? ? ? PT Long Term Goals - 07/12/21 0850   ? ?  ? PT LONG TERM GOAL #1  ? Title Pt will return to the gym   ? Baseline not yet, but improving and  concerned about covid/fluid   ? Status On-going   ?  ? PT LONG TERM GOAL #2  ? Title Pt will improve 48mn walk test to at least 10074fto demonstrate decreased fall risk   ? Baseline 643, 1061 today   ? Time 8   ? Period Weeks   ? Status Achieved   ? Target Date 05/18/21   ?  ? PT LONG TERM GOAL #3  ? Title Pt will be able to perform a 5 x sts in 20 sec or less showing improved LE strength and   ? Time 8   ? Period Weeks   ? Status Achieved   ? Target Date 05/18/21   ?  ? PT LONG TERM GOAL #  4  ? Title Pt will be able to go up and down stairs at home   ? Time 8   ? Period Weeks   ? Status Achieved   ? Target Date 12/14/20   ?  ? PT LONG TERM GOAL #5  ? Title Able to hold tandem stance and SLS for 10 seconds.   ? Baseline 35 sec tandem each side stopped by PT, Right SLS 10, left 22   ? Time 8   ? Period Weeks   ? Status Achieved   ? Target Date 07/12/21   ?  ? PT LONG TERM GOAL #6  ? Title Able to walk 1250 ft in 6 min to demonstrate improved gait speed   ? Baseline walked 772 feet but had to stop early secondary to weakness in legs   ? Period Weeks   ? Status On-going   ? Target Date 09/06/21   ?  ? PT LONG TERM GOAL #7  ? Title Pt will improve right shoulder ROM for flexion and abd to atleast 110 degrees for improved ability to reach.   ? Time 8   ? Period Weeks   ? Status New   ? Target Date 09/06/21   ?  ? PT LONG TERM GOAL #8  ? Title Pt will improve bilateral grip strength to atleast 30 # for improved function   ? Time 8   ? Period Weeks   ? Status New   ? Target Date 09/06/21   ? ?  ?  ? ?  ? ? ? ? ? ? ? ? Plan - 08/25/21 1509   ? ? Clinical Impression Statement Pt arrives to aquatic PT with much improved energy as her family stress is much improved. Pt was able to complete her exercises without issue, maybe minor fatigue but nothing significant. No further information on her return to work.   ? Stability/Clinical Decision Making Stable/Uncomplicated   ? Rehab Potential Good   ? PT Treatment/Interventions  ADLs/Self Care Home Management;Aquatic Therapy;Gait training;Stair training;Functional mobility training;Therapeutic activities;Therapeutic exercise;Balance training;Neuromuscular re-education;Manual techniq

## 2021-08-25 ENCOUNTER — Ambulatory Visit: Payer: BC Managed Care – PPO | Admitting: Physical Therapy

## 2021-08-25 ENCOUNTER — Encounter: Payer: Self-pay | Admitting: Physical Therapy

## 2021-08-25 ENCOUNTER — Other Ambulatory Visit: Payer: Self-pay

## 2021-08-25 DIAGNOSIS — M6281 Muscle weakness (generalized): Secondary | ICD-10-CM

## 2021-08-25 DIAGNOSIS — C9000 Multiple myeloma not having achieved remission: Secondary | ICD-10-CM | POA: Diagnosis not present

## 2021-08-25 DIAGNOSIS — R5383 Other fatigue: Secondary | ICD-10-CM

## 2021-08-25 DIAGNOSIS — R262 Difficulty in walking, not elsewhere classified: Secondary | ICD-10-CM

## 2021-08-25 DIAGNOSIS — R293 Abnormal posture: Secondary | ICD-10-CM

## 2021-08-26 DIAGNOSIS — I10 Essential (primary) hypertension: Secondary | ICD-10-CM | POA: Diagnosis not present

## 2021-08-26 NOTE — Therapy (Signed)
[facility] ?[facility] ?[place], [place], [address] ?Phone: [phone]   Fax:  [phone] ? ?Physical Therapy Treatment ? ?Patient Details  ?Name: Hannah [name] ?MRN: [id number] ?Date of Birth: [birth year] ?Referring Provider (PT): Cira Rue ? ? ?Encounter Date: 08/27/2021 ? ? PT End of Session - 08/27/21 1301   ? ? Visit Number 60   ? Number of Visits 69   ? Date for PT Re-Evaluation 09/06/21   ? Authorization Type BCBS   ? PT Start Time 1300   ? PT Stop Time 9983   ? PT Time Calculation (min) 45 min   ? Activity Tolerance Patient tolerated treatment well   ? Behavior During Therapy [facility] for tasks assessed/performed   ? ?  ?  ? ?  ? ? ? ? ? ?Past Medical History:  ?Diagnosis Date  ? Allergy   ? Anemia   ? Anxiety   ? Arthritis   ? Asthma   ? Depression   ? Hypertension   ? ? ?Past Surgical History:  ?Procedure Laterality Date  ? CHOLECYSTECTOMY  1993  ? COLONOSCOPY    ? TOTAL KNEE ARTHROPLASTY Right 01/28/2016  ? TOTAL KNEE ARTHROPLASTY Right 01/28/2016  ? Procedure: RIGHT TOTAL KNEE ARTHROPLASTY;  Surgeon: [name], MD;  Location: [facility];  Service: Orthopedics;  Laterality: Right;  ? TRIGGER FINGER RELEASE Right 06/18/2014  ? Procedure: RIGHT LONG FINGER TRIGGER RELEASE;  Surgeon: [name], MD;  Location: [facility];  Service: Orthopedics;  Laterality: Right;  ? TUBAL LIGATION    ? VAGINAL HYSTERECTOMY Bilateral 11/04/2016  ? Procedure: HYSTERECTOMY VAGINAL with Bilateral Salpingectomy;  Surgeon: [name], MD;  Location: [facility];  Service: Gynecology;  Laterality: Bilateral;  ? WEIL OSTEOTOMY Right 07/07/2020  ? Procedure: RIGHT FOOT WEIL OSTEOTOMY 2, 3, AND 4 METATARSALS AND PROXIMAL INTERPHALANGEAL JOINT FUSION 2 & 4 TOES;  Surgeon: Newt Minion, MD;  Location: [facility];  Service: Orthopedics;  Laterality: Right;  ? ? ?There were no vitals filed for this visit. ? ?08/27/21: ?Aquatic Treatment: Pt arrives  for aquatic physical therapy. Treatment took place in 3.5-5.5 feet of water. Water temperature was 90 degrees F. Pt entered the pool via stairs with mild use of the rails.Pt requires buoyancy of water for support and to offload joints with strengthening exercises.  Pt utilizes viscosity of the water required for strengthening.   ? ?Standing in 50%- 75% depth water pt performed water walking in all 4 directions 10x. VC to move at pace that is comfortable for pt's current status. Hand paddles added for side stepping and yellow noodle for UE push/pull. ? ?Wall exercises to inc: Hip 3 ways 20x ea with min support of pool wall. VC to push/pull against water with a force appropriate. Core contractions in partial wall squat with neck pillow 5 sec hold 10x. Bil heel raises 20x no UE support. High knee marching with water weights 6x. Core compressions with neck pillow 5 sec hold 10x. Hand paddles for shoulder ext/flex 2x10, horizontal add/abd 2x 10, VC to maintain core throughout exercises. Underwater bicycle with large noodle behind pt for min 10 min. .   ? ? ?08/25/21 Treatment: Pt arrives for aquatic physical therapy. Treatment took place in 3.5-5.5 feet of water. Water temperature was 90 degrees F. Pt entered the pool via stairs with mild use of the rails.Pt requires buoyancy of water for support and to offload joints  with strengthening exercises.  Pt utilizes viscosity of the water required for strengthening.  ? ?Standing in 50%- 75% depth water pt performed water walking in all 4 directions 10x. VC to move at pace that is comfortable for pt's current status. Hand paddles added for side stepping and yellow noodle for UE push/pull. ? ? Subjective Assessment - 08/27/21 1423   ? ? Subjective I walked yesterday! My back is hurting me and I am nervous about it.   ? How long can you sit comfortably? no limits   ? Currently in Pain? Yes   ? Pain Score 5    ? Pain Location Back   ? Pain Orientation Lower   ? Aggravating Factors   Not sure   ? Pain Relieving Factors Water   ? Multiple Pain Sites No   ? ?  ?  ? ?  ? ? ?Wall exercises to inc: Hip 3 ways 20x ea with min support of pool wall. VC to push/pull against water with a force appropriate. High knee marching with water weights held under water 4x. Hand paddles for shoulder ext/flex 2x10, horizontal add/abd 2x 10, VC to maintain core throughout exercises. Underwater bicycle with large noodle behind pt for min 2 min. Decompression float 4 min ( 2 separate times) with noodle for fatigue.   ? ?08/18/21 Treatment: Pt arrives for aquatic physical therapy. Treatment took place in 3.5-5.5 feet of water. Water temperature was 92 degrees F. Pt entered the pool via stairs with mild use of the rails.Pt requires buoyancy of water for support and to offload joints with strengthening exercises.  Pt utilizes viscosity of the water required for strengthening. Seated water bench with 75% submersion ?Pt performed seated LE AROM exercises 20x in all planes,  ? ?Standing in 50%- 75% depth water pt performed water walking in all 4 directions 10x. VC to move at pace that is comfortable for pt's current status. Hand paddles added for side stepping and yellow noodle for UE push/pull. ? ?Wall exercises to inc: Hip 3 ways 20x ea with min support of pool wall. VC to push/pull against water with a force appropriate. High knee marching with water weights held under water 4x. Hand paddles for shoulder ext/flex 2x10, horizontal add/abd 2x 10, VC to maintain core throughout exercises. Underwater bicycle with large noodle behind pt for min 2 min. Decompression float 4 min ( 2 separate times) with noodle for fatigue.   ? ?08/11/21: ?Treatment: Pt arrives for aquatic physical therapy. Treatment took place in 3.5-5.5 feet of water. Water temperature was 92 degrees F. Pt entered the pool via steps with mild use of rails. Pt requires buoyancy of water for support and to offload joints with strengthening exercises. Pt utilizes  viscosity of the water required for strengthening.  ? ?Standing in 50%- 75% depth water pt performed water walking in all 4 directions 10x. VC for speed in order to generate appropriate current for resistance and/or UE movements. Hand paddles added for side stepping. Yellow noodle for push/pull, concurrent discussion of status.  ? ?Wall exercises to inc: Hip 3 ways 20x ea with min support of pool wall adding ankle cuff weight for buoyant resistance. VC to push/pull against water with a force appropriate. Core contractions in partial wall squat with neck pillow 5 sec hold 10x.  Hold buoynat object underwater for 45 sec 4x, then knee marching with buoyant  object 4 lengths. Hand paddles for shoulder ext/flex 2x10, horizontal add/abd 2x 10, VC to maintain core throughout  exercise. Underwater bicycle using large noodle for 10 min.  ? ? ? ? ? ? ? ? ? ? ? ? ? ? ? ? ? ? ? ? ? ? ? ? ? ? ? ? ? ? ? PT Long Term Goals - 07/12/21 0850   ? ?  ? PT LONG TERM GOAL #1  ? Title Pt will return to the gym   ? Baseline not yet, but improving and concerned about covid/fluid   ? Status On-going   ?  ? PT LONG TERM GOAL #2  ? Title Pt will improve 16mn walk test to at least 10055fto demonstrate decreased fall risk   ? Baseline 643, 1061 today   ? Time 8   ? Period Weeks   ? Status Achieved   ? Target Date 05/18/21   ?  ? PT LONG TERM GOAL #3  ? Title Pt will be able to perform a 5 x sts in 20 sec or less showing improved LE strength and   ? Time 8   ? Period Weeks   ? Status Achieved   ? Target Date 05/18/21   ?  ? PT LONG TERM GOAL #4  ? Title Pt will be able to go up and down stairs at home   ? Time 8   ? Period Weeks   ? Status Achieved   ? Target Date 12/14/20   ?  ? PT LONG TERM GOAL #5  ? Title Able to hold tandem stance and SLS for 10 seconds.   ? Baseline 35 sec tandem each side stopped by PT, Right SLS 10, left 22   ? Time 8   ? Period Weeks   ? Status Achieved   ? Target Date 07/12/21   ?  ? PT LONG TERM GOAL #6  ? Title Able  to walk 1250 ft in 6 min to demonstrate improved gait speed   ? Baseline walked 772 feet but had to stop early secondary to weakness in legs   ? Period Weeks   ? Status On-going   ? Target Date 04/10/2

## 2021-08-27 ENCOUNTER — Other Ambulatory Visit: Payer: Self-pay | Admitting: Hematology

## 2021-08-27 ENCOUNTER — Telehealth: Payer: Self-pay

## 2021-08-27 ENCOUNTER — Ambulatory Visit: Payer: BC Managed Care – PPO | Admitting: Physical Therapy

## 2021-08-27 ENCOUNTER — Encounter: Payer: Self-pay | Admitting: Physical Therapy

## 2021-08-27 DIAGNOSIS — R293 Abnormal posture: Secondary | ICD-10-CM

## 2021-08-27 DIAGNOSIS — M6281 Muscle weakness (generalized): Secondary | ICD-10-CM

## 2021-08-27 DIAGNOSIS — R5383 Other fatigue: Secondary | ICD-10-CM

## 2021-08-27 DIAGNOSIS — R262 Difficulty in walking, not elsewhere classified: Secondary | ICD-10-CM | POA: Diagnosis not present

## 2021-08-27 DIAGNOSIS — C9000 Multiple myeloma not having achieved remission: Secondary | ICD-10-CM | POA: Diagnosis not present

## 2021-08-27 DIAGNOSIS — C9001 Multiple myeloma in remission: Secondary | ICD-10-CM

## 2021-08-27 NOTE — Telephone Encounter (Signed)
Pt called wanting to speak with Dr. Burr Medico regarding her recent changes in pain.  Pt stated she's having bone pain in her lower back that radiates into her pelvis down her RT leg.  Pt states she's taking Aleve for her pain but is getting minimal relief.  Pt rate pain at time of call at 4 to 5/10 but when she's more active her pain is 9/10 to the point where it's difficult to walk.  Pt has a hx of multiple myeloma with stem cell transplant.  Pt is currently seeing Dr. Aris Lot at [facility] as well. Informed pt that this RN will notify Dr. Burr Medico of her recent changes in pain and ask her to call the pt. ?

## 2021-08-31 ENCOUNTER — Telehealth: Payer: Self-pay

## 2021-08-31 NOTE — Telephone Encounter (Signed)
Spoke with pt via telephone to inform her that her MRI Lumbar/Spine has been authorized by her insurance and scheduled for 09/02/2021 '@7pm'$  with an arrival time of 06:30pm.  Informed pt that appt will also show in her MyChart as well.  Pt verbalized and confirmed appt. ?

## 2021-09-01 ENCOUNTER — Ambulatory Visit: Payer: BC Managed Care – PPO | Attending: Family | Admitting: Physical Therapy

## 2021-09-01 ENCOUNTER — Encounter: Payer: Self-pay | Admitting: Physical Therapy

## 2021-09-01 DIAGNOSIS — C9000 Multiple myeloma not having achieved remission: Secondary | ICD-10-CM | POA: Insufficient documentation

## 2021-09-01 DIAGNOSIS — M25561 Pain in right knee: Secondary | ICD-10-CM | POA: Diagnosis not present

## 2021-09-01 DIAGNOSIS — G8929 Other chronic pain: Secondary | ICD-10-CM | POA: Diagnosis not present

## 2021-09-01 DIAGNOSIS — R293 Abnormal posture: Secondary | ICD-10-CM | POA: Insufficient documentation

## 2021-09-01 DIAGNOSIS — M6281 Muscle weakness (generalized): Secondary | ICD-10-CM | POA: Insufficient documentation

## 2021-09-01 DIAGNOSIS — R5383 Other fatigue: Secondary | ICD-10-CM | POA: Diagnosis not present

## 2021-09-01 DIAGNOSIS — R262 Difficulty in walking, not elsewhere classified: Secondary | ICD-10-CM | POA: Diagnosis not present

## 2021-09-01 NOTE — Therapy (Signed)
[facility] ?[facility] ?[place], [place], [address] ?Phone: [phone]   Fax:  [phone] ? ?Physical Therapy Treatment ? ?Patient Details  ?Name: Hannah [name] ?MRN: [id number] ?Date of Birth: 10/28/1960 ?Referring Provider (PT): Cira Rue ? ? ?Encounter Date: 09/01/2021 ? ? PT End of Session - 09/01/21 0813   ? ? Visit Number 47   ? Number of Visits 69   ? Date for PT Re-Evaluation 09/06/21   ? Authorization Type BCBS   ? PT Start Time [phone]   pt late  ? PT Stop Time 0850   ? PT Time Calculation (min) 36 min   ? Activity Tolerance Patient tolerated treatment well   ? Behavior During Therapy [facility] for tasks assessed/performed   ? ?  ?  ? ?  ? ? ? ? ? ?Past Medical History:  ?Diagnosis Date  ? Allergy   ? Anemia   ? Anxiety   ? Arthritis   ? Asthma   ? Depression   ? Hypertension   ? ? ?Past Surgical History:  ?Procedure Laterality Date  ? CHOLECYSTECTOMY  1993  ? COLONOSCOPY    ? TOTAL KNEE ARTHROPLASTY Right 01/28/2016  ? TOTAL KNEE ARTHROPLASTY Right 01/28/2016  ? Procedure: RIGHT TOTAL KNEE ARTHROPLASTY;  Surgeon: [name], MD;  Location: [facility];  Service: Orthopedics;  Laterality: Right;  ? TRIGGER FINGER RELEASE Right 06/18/2014  ? Procedure: RIGHT LONG FINGER TRIGGER RELEASE;  Surgeon: [name], MD;  Location: [facility];  Service: Orthopedics;  Laterality: Right;  ? TUBAL LIGATION    ? VAGINAL HYSTERECTOMY Bilateral 11/04/2016  ? Procedure: HYSTERECTOMY VAGINAL with Bilateral Salpingectomy;  Surgeon: [name], MD;  Location: [facility];  Service: Gynecology;  Laterality: Bilateral;  ? WEIL OSTEOTOMY Right 07/07/2020  ? Procedure: RIGHT FOOT WEIL OSTEOTOMY 2, 3, AND 4 METATARSALS AND PROXIMAL INTERPHALANGEAL JOINT FUSION 2 & 4 TOES;  Surgeon: Newt Minion, MD;  Location: [facility];  Service: Orthopedics;  Laterality: Right;  ? ? ?There were no vitals filed for this visit. ? ?08/27/21: ?Aquatic Treatment: Pt  arrives for aquatic physical therapy. Treatment took place in 3.5-5.5 feet of water. Water temperature was 92 degrees F. Pt entered the pool via stairs with mild use of the rails.Pt requires buoyancy of water for support and to offload joints with strengthening exercises.  Pt utilizes viscosity of the water required for strengthening.   ? ?Standing in 50%- 75% depth water pt performed water walking in all 10 directions 10x. VC to move at pace that is comfortable for pt's current status. Mitten hands for side stepping and yellow noodle for UE push/pull. ? ?Wall exercises to inc: Hip 3 ways 20x ea with min support of pool wall. VC to push/pull against water with a force appropriate. Core contractions in partial wall squat with neck pillow 5 sec hold 10x. High knee marching with water weights 6x. Core compressions with neck pillow 5 sec hold 10x. Hand paddles for shoulder ext/flex 2x10, horizontal add/abd 2x 10, VC to maintain core throughout exercises. Underwater bicycle with large noodle behind pt for min 1mn.  ? ? ?08/25/21 Treatment: Pt arrives for aquatic physical therapy. Treatment took place in 3.5-5.5 feet of water. Water temperature was 90 degrees F. Pt entered the pool via stairs with mild use of the rails.Pt requires buoyancy of water for support and to offload joints with strengthening exercises.  Pt utilizes viscosity of  the water required for strengthening.  ? ?Standing in 50%- 75% depth water pt performed water walking in all 4 directions 10x. VC to move at pace that is comfortable for pt's current status. Hand paddles added for side stepping and yellow noodle for UE push/pull. ? ? Subjective Assessment - 09/01/21 0954   ? ? Subjective I am having a MRI on my back tomorrow   ? 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 She as diagnosed in February 2022 with Multiple Myeloma not in remission. 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 Currently in Pain? No/denies   I'm ok right now, LBP is intermittent  ? ?  ?  ? ?  ? ? ? ?Wall exercises to inc: Hip 3 ways 20x ea with min support of pool wall. VC to push/pull against water with a force appropriate. High knee marching with water weights held under water 4x. Hand paddles for shoulder ext/flex 2x10, horizontal add/abd 2x 10, VC to maintain core throughout exercises. Underwater bicycle with large noodle behind pt for min 2 min. Decompression float 4 min ( 2 separate times) with noodle for fatigue.   ? ?08/18/21 Treatment: Pt arrives for aquatic physical therapy. Treatment took place in 3.5-5.5 feet of water. Water temperature was 92 degrees F. Pt entered the pool via stairs with mild use of the rails.Pt requires buoyancy of water for support and to offload joints with strengthening exercises.  Pt utilizes viscosity of the water required for strengthening. Seated water bench with 75% submersion ?Pt performed seated LE AROM exercises 20x in all planes,  ? ?Standing in 50%- 75% depth water pt performed water walking in all 4 directions 10x. VC to move at pace that is comfortable for pt's current status. Hand paddles added for side stepping and yellow noodle for UE push/pull. ? ?Wall exercises to inc: Hip 3 ways 20x ea with min support of pool wall. VC to push/pull against water with a force appropriate. High knee marching with water weights held under water 4x. Hand paddles for shoulder ext/flex 2x10, horizontal add/abd 2x 10, VC to maintain core throughout exercises. Underwater bicycle with large noodle behind pt for min 2 min. Decompression float 4 min ( 2 separate times) with noodle for fatigue.   ? ?08/11/21: ?Treatment: Pt arrives for aquatic physical therapy. Treatment took place in 3.5-5.5 feet of water. Water temperature was 92 degrees F. Pt entered the pool via steps with mild use of rails. Pt requires buoyancy of water for support and to offload joints  with strengthening exercises. Pt utilizes viscosity of the water required for strengthening.  ? ?Standing in 50%- 75% depth water pt performed water walking in all 4 directions 10x. VC for speed in order to generate appropriate current for resistance and/or UE movements. Hand paddles added for side stepping. Yellow noodle for push/pull, concurrent discussion of status.  ? ?Wall exercises to inc: Hip 3 ways 20x ea with min support of pool wall adding ankle cuff weight for buoyant resistance. VC to push/pull against water with a force appropriate. Core contractions in partial wall squat with neck pillow 5 sec hold 10x.  Hold buoynat object underwater for 45 sec 4x, then knee marching with buoyant  object 4 lengths. Hand paddles for shoulder ext/flex 2x10, horizontal add/abd 2x 10, VC to maintain core throughout exercise. Underwater bicycle using large noodle for 10 min.  ? ? ? ? ? ? ? ? ? ? ? ? ? ? ? ? ? ? ? ? ? ? ? ? ? ? ? ? ? ? ?  PT Long Term Goals - 07/12/21 0850   ? ?  ? PT LONG TERM GOAL #1  ? Title Pt will return to the gym   ? Baseline not yet, but improving and concerned about covid/fluid   ? Status On-going   ?  ? PT LONG TERM GOAL #2  ? Title Pt will improve 13mn walk test to at least 10073fto demonstrate decreased fall risk   ? Baseline 643, 1061 today   ? Time 8   ? Period Weeks   ? Status Achieved   ? Target Date 05/18/21   ?  ? PT LONG TERM GOAL #3  ? Title Pt will be able to perform a 5 x sts in 20 sec or less showing improved LE strength and   ? Time 8   ? Period Weeks   ? Status Achieved   ? Target Date 05/18/21   ?  ? PT LONG TERM GOAL #4  ? Title Pt will be able to go up and down stairs at home   ? Time 8   ? Period Weeks   ? Status Achieved   ? Target Date 12/14/20   ?  ? PT LONG TERM GOAL #5  ? Title Able to hold tandem stance and SLS for 10 seconds.   ? Baseline 35 sec tandem each side stopped by PT, Right SLS 10, left 22   ? Time 8   ? Period Weeks   ? Status Achieved   ? Target Date 07/12/21    ?  ? PT LONG TERM GOAL #6  ? Title Able to walk 1250 ft in 6 min to demonstrate improved gait speed   ? Baseline walked 772 feet but had to stop early secondary to weakness in legs   ? Period Weeks   ?

## 2021-09-02 ENCOUNTER — Ambulatory Visit (HOSPITAL_COMMUNITY)
Admission: RE | Admit: 2021-09-02 | Discharge: 2021-09-02 | Disposition: A | Discharge status: Home / Self Care | Payer: BC Managed Care – PPO | Source: Ambulatory Visit | Attending: Hematology | Admitting: Hematology

## 2021-09-02 ENCOUNTER — Ambulatory Visit: Payer: BC Managed Care – PPO

## 2021-09-02 DIAGNOSIS — Z1339 Encounter for screening examination for other mental health and behavioral disorders: Secondary | ICD-10-CM | POA: Diagnosis not present

## 2021-09-02 DIAGNOSIS — Z0000 Encounter for general adult medical examination without abnormal findings: Secondary | ICD-10-CM | POA: Diagnosis not present

## 2021-09-02 DIAGNOSIS — M5126 Other intervertebral disc displacement, lumbar region: Secondary | ICD-10-CM | POA: Diagnosis not present

## 2021-09-02 DIAGNOSIS — R82998 Other abnormal findings in urine: Secondary | ICD-10-CM | POA: Diagnosis not present

## 2021-09-02 DIAGNOSIS — Z1331 Encounter for screening for depression: Secondary | ICD-10-CM | POA: Diagnosis not present

## 2021-09-02 DIAGNOSIS — M4317 Spondylolisthesis, lumbosacral region: Secondary | ICD-10-CM | POA: Diagnosis not present

## 2021-09-02 DIAGNOSIS — M48061 Spinal stenosis, lumbar region without neurogenic claudication: Secondary | ICD-10-CM | POA: Diagnosis not present

## 2021-09-02 DIAGNOSIS — M5136 Other intervertebral disc degeneration, lumbar region: Secondary | ICD-10-CM | POA: Diagnosis not present

## 2021-09-02 DIAGNOSIS — C9001 Multiple myeloma in remission: Secondary | ICD-10-CM | POA: Diagnosis not present

## 2021-09-02 DIAGNOSIS — I10 Essential (primary) hypertension: Secondary | ICD-10-CM | POA: Diagnosis not present

## 2021-09-02 MED ORDER — GADOBUTROL 1 MMOL/ML IV SOLN
9.0000 mL | Freq: Once | INTRAVENOUS | Status: AC | PRN
  Administered 2021-09-02: 9 mL via INTRAVENOUS

## 2021-09-02 NOTE — Therapy (Signed)
[facility] ?[facility] ?[place], [place], [address] ?Phone: [phone]   Fax:  [phone] ? ?Physical Therapy Treatment ? ?Patient Details  ?Name: Hannah [name] ?MRN: [id number] ?Date of Birth: [birth year] ?Referring Provider (PT): Cira Rue ? ? ?Encounter Date: 09/03/2021 ? ? PT End of Session - 09/03/21 1212   ? ? Visit Number 84   ? Number of Visits 69   ? Date for PT Re-Evaluation 09/06/21   ? Authorization Type BCBS   ? PT Start Time 1212   ? PT Stop Time 1300   ? PT Time Calculation (min) 48 min   ? Activity Tolerance Patient tolerated treatment well;Patient limited by fatigue   ? Behavior During Therapy [facility] for tasks assessed/performed   ? ?  ?  ? ?  ? ? ? ? ? ? ?Past Medical History:  ?Diagnosis Date  ? Allergy   ? Anemia   ? Anxiety   ? Arthritis   ? Asthma   ? Depression   ? Hypertension   ? ? ?Past Surgical History:  ?Procedure Laterality Date  ? CHOLECYSTECTOMY  1993  ? COLONOSCOPY    ? TOTAL KNEE ARTHROPLASTY Right 01/28/2016  ? TOTAL KNEE ARTHROPLASTY Right 01/28/2016  ? Procedure: RIGHT TOTAL KNEE ARTHROPLASTY;  Surgeon: [name], MD;  Location: [facility];  Service: Orthopedics;  Laterality: Right;  ? TRIGGER FINGER RELEASE Right 06/18/2014  ? Procedure: RIGHT LONG FINGER TRIGGER RELEASE;  Surgeon: [name], MD;  Location: [facility];  Service: Orthopedics;  Laterality: Right;  ? TUBAL LIGATION    ? VAGINAL HYSTERECTOMY Bilateral 11/04/2016  ? Procedure: HYSTERECTOMY VAGINAL with Bilateral Salpingectomy;  Surgeon: [name], MD;  Location: [facility];  Service: Gynecology;  Laterality: Bilateral;  ? WEIL OSTEOTOMY Right 07/07/2020  ? Procedure: RIGHT FOOT WEIL OSTEOTOMY 2, 3, AND 4 METATARSALS AND PROXIMAL INTERPHALANGEAL JOINT FUSION 2 & 4 TOES;  Surgeon: Newt Minion, MD;  Location: [facility];  Service: Orthopedics;  Laterality: Right;  ? ? ?There were no vitals filed for this visit. ? ?09/03/21:  Aquatic Treatment: Pt arrives for aquatic physical therapy. Treatment took place in 3.5-5.5 feet of water. Water temperature was 91 degrees F. Pt entered the pool via stairs with mild use of the rails.Pt requires buoyancy of water for support and to offload joints with strengthening exercises.  Pt utilizes viscosity of the water required for strengthening.   ? ?Standing in 50%- 75% depth water pt performed water walking in all 10 directions 10x. VC to move at pace that is comfortable for pt's current status. Mitten hands for side stepping and yellow noodle for UE push/pull. ? ?Wall exercises to inc: Hip 3 ways 20x ea with min support of pool wall. VC to push/pull against water with a force appropriate. Core contractions in partial wall squat with neck pillow 5 sec hold 10x. High knee marching with water weights 6x. Core compressions with neck pillow 5 sec hold 10x. Hand paddles for shoulder ext/flex 2x10, horizontal add/abd 2x 10, VC to maintain core throughout exercises. Underwater bicycle with large noodle behind pt for min 64mn.  ? ?08/27/21: ?Aquatic Treatment: Pt arrives for aquatic physical therapy. Treatment took place in 3.5-5.5 feet of water. Water temperature was 92 degrees F. Pt entered the pool via stairs with mild use of the rails.Pt requires buoyancy of water for support and to offload joints with strengthening exercises.  Pt utilizes viscosity  of the water required for strengthening.   ? ?Standing in 50%- 75% depth water pt performed water walking in all 10 directions 10x. VC to move at pace that is comfortable for pt's current status. Mitten hands for side stepping and yellow noodle for UE push/pull. ? ?Wall exercises to inc: Hip 3 ways 20x ea with min support of pool wall. VC to push/pull against water with a force appropriate. Core contractions in partial wall squat with neck pillow 5 sec hold 10x. High knee marching with water weights 6x. Core compressions with neck pillow 5 sec hold 10x. Hand  paddles for shoulder ext/flex 2x10, horizontal add/abd 2x 10, VC to maintain core throughout exercises. Underwater bicycle with large noodle behind pt for min 45mn.  ? ? ?08/25/21 Treatment: Pt arrives for aquatic physical therapy. Treatment took place in 3.5-5.5 feet of water. Water temperature was 90 degrees F. Pt entered the pool via stairs with mild use of the rails.Pt requires buoyancy of water for support and to offload joints with strengthening exercises.  Pt utilizes viscosity of the water required for strengthening.  ? ?Standing in 50%- 75% depth water pt performed water walking in all 4 directions 10x. VC to move at pace that is comfortable for pt's current status. Hand paddles added for side stepping and yellow noodle for UE push/pull. ? ? Subjective Assessment - 09/03/21 1214   ? ? Subjective Had MRI last night. Allergies are getting me today.   ? Multiple Pain Sites --   Nothing abnormal.  ? ?  ?  ? ?  ? ? ? ? ?Wall exercises to inc: Hip 3 ways 20x ea with min support of pool wall. VC to push/pull against water with a force appropriate. High knee marching with water weights held under water 4x. Hand paddles for shoulder ext/flex 2x10, horizontal add/abd 2x 10, VC to maintain core throughout exercises. Underwater bicycle with large noodle behind pt for min 2 min. Decompression float 4 min ( 2 separate times) with noodle for fatigue.   ? ?08/18/21 Treatment: Pt arrives for aquatic physical therapy. Treatment took place in 3.5-5.5 feet of water. Water temperature was 92 degrees F. Pt entered the pool via stairs with mild use of the rails.Pt requires buoyancy of water for support and to offload joints with strengthening exercises.  Pt utilizes viscosity of the water required for strengthening. Seated water bench with 75% submersion ?Pt performed seated LE AROM exercises 20x in all planes,  ? ?Standing in 50%- 75% depth water pt performed water walking in all 4 directions 10x. VC to move at pace that is  comfortable for pt's current status. Hand paddles added for side stepping and yellow noodle for UE push/pull. ? ?Wall exercises to inc: Hip 3 ways 20x ea with min support of pool wall. VC to push/pull against water with a force appropriate. High knee marching with water weights held under water 4x. Hand paddles for shoulder ext/flex 2x10, horizontal add/abd 2x 10, VC to maintain core throughout exercises. Underwater bicycle with large noodle behind pt for min 2 min. Decompression float 4 min ( 2 separate times) with noodle for fatigue.   ? ?08/11/21: ?Treatment: Pt arrives for aquatic physical therapy. Treatment took place in 3.5-5.5 feet of water. Water temperature was 92 degrees F. Pt entered the pool via steps with mild use of rails. Pt requires buoyancy of water for support and to offload joints with strengthening exercises. Pt utilizes viscosity of the water required for strengthening.  ? ?  Standing in 50%- 75% depth water pt performed water walking in all 4 directions 10x. VC for speed in order to generate appropriate current for resistance and/or UE movements. Hand paddles added for side stepping. Yellow noodle for push/pull, concurrent discussion of status.  ? ?Wall exercises to inc: Hip 3 ways 20x ea with min support of pool wall adding ankle cuff weight for buoyant resistance. VC to push/pull against water with a force appropriate. Core contractions in partial wall squat with neck pillow 5 sec hold 10x.  Hold buoynat object underwater for 45 sec 4x, then knee marching with buoyant  object 4 lengths. Hand paddles for shoulder ext/flex 2x10, horizontal add/abd 2x 10, VC to maintain core throughout exercise. Underwater bicycle using large noodle for 10 min.  ? ? ? ? ? ? ? ? ? ? ? ? ? ? ? ? ? ? ? ? ? ? ? ? ? ? ? ? ? ? ? PT Long Term Goals - 07/12/21 0850   ? ?  ? PT LONG TERM GOAL #1  ? Title Pt will return to the gym   ? Baseline not yet, but improving and concerned about covid/fluid   ? Status On-going   ?  ?  PT LONG TERM GOAL #2  ? Title Pt will improve 7mn walk test to at least 10028fto demonstrate decreased fall risk   ? Baseline 643, 1061 today   ? Time 8   ? Period Weeks   ? Status Achieved   ? Target Date 12/20

## 2021-09-03 ENCOUNTER — Encounter: Payer: Self-pay | Admitting: Physical Therapy

## 2021-09-03 ENCOUNTER — Ambulatory Visit: Payer: BC Managed Care – PPO | Admitting: Physical Therapy

## 2021-09-03 ENCOUNTER — Telehealth: Payer: Self-pay | Admitting: *Deleted

## 2021-09-03 DIAGNOSIS — R5383 Other fatigue: Secondary | ICD-10-CM

## 2021-09-03 DIAGNOSIS — R262 Difficulty in walking, not elsewhere classified: Secondary | ICD-10-CM

## 2021-09-03 DIAGNOSIS — G8929 Other chronic pain: Secondary | ICD-10-CM | POA: Diagnosis not present

## 2021-09-03 DIAGNOSIS — M6281 Muscle weakness (generalized): Secondary | ICD-10-CM | POA: Diagnosis not present

## 2021-09-03 DIAGNOSIS — R293 Abnormal posture: Secondary | ICD-10-CM

## 2021-09-03 DIAGNOSIS — C9000 Multiple myeloma not having achieved remission: Secondary | ICD-10-CM | POA: Diagnosis not present

## 2021-09-03 DIAGNOSIS — M25561 Pain in right knee: Secondary | ICD-10-CM | POA: Diagnosis not present

## 2021-09-03 NOTE — Telephone Encounter (Signed)
Disability paperwork completed today.  Currently placed in designated mail bin for collaborative pickup, provider review and signature.       ? ?This nurse connected with THATIANA RENBARGER [phone] (home) with questions for assistance with form received 08/30/2021.   ?When advised to not work? ?Last date of work? ?Expected return to work date? ?Do you feel able to return to work?  Could you provide me with your subjective report of current limitations or work restrictions needed?      ? ?"Do not recall the last day of work.  Have not worked since diagnosed with Multiple Myeloma, February 2022, before transplant.  They all should have records.  Check with my therapists and Dr. Burr Medico, they all should have records.  I do not know what they recommend.  Dr. Aris Lot wanted me to gradually return but not enough information to support this so I am expected to return to work, full time, effective Oct 16, 2021."     ? ?Shared this nurse did note goals of therapy to improve walking distance and strength.    ?

## 2021-09-07 ENCOUNTER — Encounter: Payer: Self-pay | Admitting: Hematology

## 2021-09-07 NOTE — Telephone Encounter (Signed)
Disability paperwork successfully faxed to [facility] fax no#: [phone].  Original copy prepared to mail to patient address on file. ?[address] ?[place] [address] ?  ?Copy to "Record Release" bin in front office area before [employer] [facility] [place] staff to forward to [facility] ([place]) Information Management Office, Phone: [phone], Fax: [phone] to complete records requested for disability process.  ?No further completed form delivery actions performed or further instructions received. ? ?

## 2021-09-09 DIAGNOSIS — Z1212 Encounter for screening for malignant neoplasm of rectum: Secondary | ICD-10-CM | POA: Diagnosis not present

## 2021-09-14 ENCOUNTER — Ambulatory Visit: Payer: BC Managed Care – PPO

## 2021-09-14 DIAGNOSIS — R293 Abnormal posture: Secondary | ICD-10-CM

## 2021-09-14 DIAGNOSIS — R262 Difficulty in walking, not elsewhere classified: Secondary | ICD-10-CM | POA: Diagnosis not present

## 2021-09-14 DIAGNOSIS — C9000 Multiple myeloma not having achieved remission: Secondary | ICD-10-CM | POA: Diagnosis not present

## 2021-09-14 DIAGNOSIS — M25561 Pain in right knee: Secondary | ICD-10-CM | POA: Diagnosis not present

## 2021-09-14 DIAGNOSIS — M6281 Muscle weakness (generalized): Secondary | ICD-10-CM

## 2021-09-14 DIAGNOSIS — G8929 Other chronic pain: Secondary | ICD-10-CM | POA: Diagnosis not present

## 2021-09-14 DIAGNOSIS — R5383 Other fatigue: Secondary | ICD-10-CM

## 2021-09-14 NOTE — Therapy (Signed)
[facility] ?[facility] ?[place], [place], [address] ?Phone: [phone]   Fax:  [phone] ? ?Physical Therapy Treatment ? ?Patient Details  ?Name: Hannah [name] ?MRN: [id number] ?Date of Birth: 04-23-1961 ?Referring Provider (PT): Cira Rue ? ? ?Encounter Date: 09/14/2021 ? ? PT End of Session - 09/14/21 0901   ? ? Visit Number [id number]   ? Number of Visits 70   ? Date for PT Re-Evaluation 10/12/21   ? PT Start Time 0800   ? PT Stop Time 0855   ? PT Time Calculation (min) 55 min   ? ?  ?  ? ?  ? ? ?Past Medical History:  ?Diagnosis Date  ? Allergy   ? Anemia   ? Anxiety   ? Arthritis   ? Asthma   ? Depression   ? Hypertension   ? ? ?Past Surgical History:  ?Procedure Laterality Date  ? CHOLECYSTECTOMY  1993  ? COLONOSCOPY    ? TOTAL KNEE ARTHROPLASTY Right 01/28/2016  ? TOTAL KNEE ARTHROPLASTY Right 01/28/2016  ? Procedure: RIGHT TOTAL KNEE ARTHROPLASTY;  Surgeon: [name], MD;  Location: [facility];  Service: Orthopedics;  Laterality: Right;  ? TRIGGER FINGER RELEASE Right 06/18/2014  ? Procedure: RIGHT LONG FINGER TRIGGER RELEASE;  Surgeon: [name], MD;  Location: [facility];  Service: Orthopedics;  Laterality: Right;  ? TUBAL LIGATION    ? VAGINAL HYSTERECTOMY Bilateral 11/04/2016  ? Procedure: HYSTERECTOMY VAGINAL with Bilateral Salpingectomy;  Surgeon: [name], MD;  Location: [facility];  Service: Gynecology;  Laterality: Bilateral;  ? WEIL OSTEOTOMY Right 07/07/2020  ? Procedure: RIGHT FOOT WEIL OSTEOTOMY 2, 3, AND 4 METATARSALS AND PROXIMAL INTERPHALANGEAL JOINT FUSION 2 & 4 TOES;  Surgeon: Newt Minion, MD;  Location: [facility];  Service: Orthopedics;  Laterality: Right;  ? ? ?There were no vitals filed for this visit. ? ? Subjective Assessment - 09/14/21 0803   ? ? Subjective Had MRI results with no new multiple myeloma. I am feeling stronger, but still need to take some breaks. I don't nap as much during  the day. I can do more of my errands.  I can wash dishes, do laundry, cleaning bathrooms etc but take some breaks. Still have trouble carrying things, multiple repetitions with stairs, and have to limit number of chores at one time. right shoulder is still weak, but doesn't bother me quite as bad. Depends on the weather and how much I have done. Pt will return to work around May 22.   ? 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 She as diagnosed in February 2022 with Multiple Myeloma not in remission. 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 Limitations Walking   ? How long can you sit comfortably? no limits   ? How long can you stand comfortably? 37min   ? How long can you walk comfortably? 22min   ? Diagnostic tests Recent lumbar MRI; no NED   ? Patient Stated Goals get strong, build up the muscles   ? Currently in Pain? Yes   ? Pain Score 2    ? Pain Location Shoulder   ? Pain Orientation Right   ? Pain Descriptors / Indicators Aching   ? Pain Type Chronic pain   ? Pain Onset More than a month ago   ?  Pain Frequency Constant   ? Aggravating Factors  weather, excessive use   ? Pain Relieving Factors rest, aleve, tylenol, heat   ? Multiple Pain Sites No   none presently  ? ?  ?  ? ?  ? ? ? ? ? [facility] PT Assessment - 09/14/21 0001   ? ?  ? AROM  ? Right Shoulder Flexion 83 Degrees   with pain/crepitus  ? Right Shoulder ABduction 67 Degrees   pain and crepitus  ?  ? Strength  ? Right Hand Grip (lbs) 27   ? Left Hand Grip (lbs) 33   ?  ? 6 minute walk test results   ? Aerobic Endurance Distance Walked 1063 ft  ? ?  ?  ? ?  ? ? ? ? ? ? ? ? ? ? ? ? ? ? ? ? [facility] PT Treatment/Exercise - 09/14/21 0001   ? ?  ? Exercises  ? Other Exercises  Nu step seat and UE 9, lev 5 x 6 min, wall slides right UE flex and abd x5, step and hold on ax 2x5 ea,  yellow band lateral band walks and monster walks 5 steps x 6 laps,    ? ?  ?  ? ?  ? ? ? ? ? ? ? ? ? ? ? ? ? ? ? PT Long Term Goals - 09/14/21 0813   ? ?  ? PT LONG TERM GOAL #1  ? Title Pt will return to the gym   ? Baseline not yet, but improving and concerned about covid/   ? Time 4   ? Period Weeks   ? Status On-going   ? Target Date 10/12/21   ?  ? PT LONG TERM GOAL #2  ? Title Pt will improve 43min walk test to at least 102ft to demonstrate decreased fall risk   ? Baseline 643, 1061 today   ? Time 8   ? Period Weeks   ? Status Achieved   ? Target Date 05/18/21   ?  ? PT LONG TERM GOAL #3  ? Title Pt will be able to perform a 5 x sts in 20 sec or less showing improved LE strength and   ? Period Weeks   ? Status Achieved   ? Target Date 05/18/21   ?  ? PT LONG TERM GOAL #4  ? Title Pt will be able to go up and down stairs at home   ? Time 8   ? Period Weeks   ? Status Achieved   ? Target Date 12/14/20   ?  ? PT LONG TERM GOAL #5  ? Title Able to hold tandem stance and SLS for 10 seconds.   ? Baseline 35 sec tandem each side stopped by PT, Right SLS 10, left 22   ? Time 8   ? Period Weeks   ? Status Achieved   ? Target Date 07/12/21   ?  ? PT LONG TERM GOAL #6  ? Title Able to walk 1250 ft in 6 min to demonstrate improved gait speed   ? Baseline 1063 feet today 09/14/2021   ? Time 4   ? Period Weeks   ? Status On-going   ? Target Date 10/12/21   ?  ? PT LONG TERM GOAL #7  ? Title Pt will improve right shoulder ROM for flexion and abd to atleast (revised ) 95 degrees for improved ability to reach.   ? Status On-going (83 and  67 today)  ? Target Date 10/12/21   ?  ? PT LONG TERM GOAL #8  ? Title Pt will improve bilateral grip strength to atleast 30 # for improved function   ? Baseline achieved for left, not right(27)   ? Time 4   ? Period Weeks   ? Status Partially Met   ? Target Date 10/12/21   ? ?  ?  ? ?  ? ? ? ? ? ? ? ? Plan - 09/14/21 0902   ? ? Clinical Impression Statement Pt has continued to make functional improvements with ability to perform more errands and household  chores with fewer rest breaks, and fewer naps however, she does still require rest. Goals were assessed today and were carried over. She partially achieved her grip strength goal for 30 Lbs with the left, but had only 27 lbs on the right.  She was able to walk continuously for 6 min with an improved pace and walked 1063 feet, but did not achieve goal of 1250 ft. She improved with right shoulder AROM but is still very limited by pain and crepitus.  We will continue therapy for 4 more weeks to continue to progress pts progress towards LTG's not yet achieved, and to give pt time to seek out other exercise opportunities. She is planning on returning to work on May 22 nd.  She was given some yellow putty today to take home and use for her grip strength. She has been advised to continue her HEP, She was fatigued at completion of therapy today .   ? Personal Factors and Comorbidities Comorbidity 3+   ? Comorbidities Multiple Myeloma, multi joint OA, recent foot surgery, fall risk,SCT   ? Examination-Activity Limitations Sleep;Squat;Stand;Stairs;Transfers;Locomotion Level;Sit   ? Examination-Participation Restrictions Cleaning;Community Activity;Occupation;Shop   ? Stability/Clinical Decision Making Stable/Uncomplicated   ? Rehab Potential Good   ? PT Frequency 3x / week   ? PT Duration 4 weeks   ? PT Treatment/Interventions ADLs/Self Care Home Management;Aquatic Therapy;Gait training;Stair training;Functional mobility training;Therapeutic activities;Therapeutic exercise;Balance training;Neuromuscular re-education;Manual techniques;Patient/family education;Passive range of motion;Joint Manipulations   ? PT Next Visit Plan continue aquatics 2x/week, land therapy 1x per week, pt to search out other community exercise opportunites, continue balance, strength, endurance activities   ? Recommended Other Services seek out community based activities to begin after her release in May   ? Consulted and Agree with Plan of Care  Patient   ? ?  ?  ? ?  ? ? ?Patient will benefit from skilled therapeutic intervention in order to improve the following deficits and impairments:  Abnormal gait, Decreased mobility, Decreased activity tolerance,

## 2021-09-14 NOTE — Therapy (Signed)
[facility] ?[facility] ?[place], [place], [address] ?Phone: [phone]   Fax:  [phone] ? ?Physical Therapy Treatment ? ?Patient Details  ?Name: Hannah [name] ?MRN: [id number] ?Date of Birth: 1960-06-08 ?Referring Provider (PT): Cira Rue ? ? ?Encounter Date: 09/15/2021 ? ? PT End of Session - 09/15/21 0805   ? ? Visit Number 61   ? Number of Visits 70   ? Date for PT Re-Evaluation 10/12/21   ? Authorization Type BCBS   ? PT Stop Time 8315   ? Activity Tolerance Patient tolerated treatment well   ? Behavior During Therapy [facility] for tasks assessed/performed   ? ?  ?  ? ?  ? ? ? ?Past Medical History:  ?Diagnosis Date  ? Allergy   ? Anemia   ? Anxiety   ? Arthritis   ? Asthma   ? Depression   ? Hypertension   ? ? ?Past Surgical History:  ?Procedure Laterality Date  ? CHOLECYSTECTOMY  1993  ? COLONOSCOPY    ? TOTAL KNEE ARTHROPLASTY Right 01/28/2016  ? TOTAL KNEE ARTHROPLASTY Right 01/28/2016  ? Procedure: RIGHT TOTAL KNEE ARTHROPLASTY;  Surgeon: [name], MD;  Location: [facility];  Service: Orthopedics;  Laterality: Right;  ? TRIGGER FINGER RELEASE Right 06/18/2014  ? Procedure: RIGHT LONG FINGER TRIGGER RELEASE;  Surgeon: [name], MD;  Location: [facility];  Service: Orthopedics;  Laterality: Right;  ? TUBAL LIGATION    ? VAGINAL HYSTERECTOMY Bilateral 11/04/2016  ? Procedure: HYSTERECTOMY VAGINAL with Bilateral Salpingectomy;  Surgeon: [name], MD;  Location: [facility];  Service: Gynecology;  Laterality: Bilateral;  ? WEIL OSTEOTOMY Right 07/07/2020  ? Procedure: RIGHT FOOT WEIL OSTEOTOMY 2, 3, AND 4 METATARSALS AND PROXIMAL INTERPHALANGEAL JOINT FUSION 2 & 4 TOES;  Surgeon: Newt Minion, MD;  Location: [facility];  Service: Orthopedics;  Laterality: Right;  ? ? ?There were no vitals filed for this visit. ? ? Subjective Assessment - 09/15/21 1310   ? ? Subjective Back pain much better, pt arrives with no back  pain today.   ? 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 She as diagnosed in February 2022 with Multiple Myeloma not in remission. 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 Currently in Pain? No/denies   ? ?  ?  ? ?  ? ? ? ? ? ? [facility] PT Assessment - 09/14/21 0001   ? ?  ? AROM  ? Right Shoulder Flexion 83 Degrees   with pain/crepitus  ? Right Shoulder ABduction 67 Degrees   pain and crepitus  ?  ? Strength  ? Right Hand Grip (lbs) 27   ? Left Hand Grip (lbs) 33   ?  ? 6 minute walk test results   ? Aerobic Endurance Distance Walked 1063 ft  ? ?  ?  ? ?  ? ?Aquatic Therapy Treatment: Pt arrives for aquatic physical therapy. Treatment took place in 3.5-5.5 feet of water. Water temperature was 91 degrees F. Pt entered the pool via stairs with mild use of rails. Pt requires buoyancy of water for support and to offload joints with strengthening exercises.  Pt utilizes viscosity of the water required for strengthening.  ? ?Standing in 50%- 75% depth water pt performed water walking in all 4 directions 10x. VC for speed in order  to generate appropriate current for resistance and/or UE movements. Hand paddles added for side stepping.  Wall exercises to inc: Hip 3 ways 20x ea with min support of pool wall. VC to push/pull against water with a force appropriate. Core contractions in partial wall squat with neck pillow 5 sec hold 10x. Bil heel raises 20x no UE support. High knee marching across pool 4x with single buoy weights. Hand paddles for shoulder ext/flex 2x10, horizontal add/abd 2x 10, VC to maintain core throughout exercises. Underwater bicycle with large noodle behind pt for 10 min. Intermittent decompression hand 10-30 sec for lumbar decompression.   ? ? ? ? ? ? ? ? ? ? ? ? ? ? ? ? ? ? ? ? ? ? ? ? ? ? ? ? ? ? PT Long Term Goals - 09/14/21 0813   ? ?  ? PT LONG TERM GOAL #1  ? Title Pt will return to the  gym   ? Baseline not yet, but improving and concerned about covid/   ? Time 4   ? Period Weeks   ? Status On-going   ? Target Date 10/12/21   ?  ? PT LONG TERM GOAL #2  ? Title Pt will improve 23mn walk test to at least 10026fto demonstrate decreased fall risk   ? Baseline 643, 1061 today   ? Time 8   ? Period Weeks   ? Status Achieved   ? Target Date 05/18/21   ?  ? PT LONG TERM GOAL #3  ? Title Pt will be able to perform a 5 x sts in 20 sec or less showing improved LE strength and   ? Period Weeks   ? Status Achieved   ? Target Date 05/18/21   ?  ? PT LONG TERM GOAL #4  ? Title Pt will be able to go up and down stairs at home   ? Time 8   ? Period Weeks   ? Status Achieved   ? Target Date 12/14/20   ?  ? PT LONG TERM GOAL #5  ? Title Able to hold tandem stance and SLS for 10 seconds.   ? Baseline 35 sec tandem each side stopped by PT, Right SLS 10, left 22   ? Time 8   ? Period Weeks   ? Status Achieved   ? Target Date 07/12/21   ?  ? PT LONG TERM GOAL #6  ? Title Able to walk 1250 ft in 6 min to demonstrate improved gait speed   ? Baseline 1063 feet today 09/14/2021   ? Time 4   ? Period Weeks   ? Status On-going   ? Target Date 10/12/21   ?  ? PT LONG TERM GOAL #7  ? Title Pt will improve right shoulder ROM for flexion and abd to atleast (revised ) 95 degrees for improved ability to reach.   ? Status On-going (83 and 67 today)  ? Target Date 10/12/21   ?  ? PT LONG TERM GOAL #8  ? Title Pt will improve bilateral grip strength to atleast 30 # for improved function   ? Baseline achieved for left, not right(27)   ? Time 4   ? Period Weeks   ? Status Partially Met   ? Target Date 10/12/21   ? ?  ?  ? ?  ? ? ? ? ? ? ? ? Plan - 09/14/21 0902   ? ? Clinical Impression Statement Pt arrives  for aquatic PT today with no back pain and reports she may feel better when she moves more but it is a double edge sword because then she gets fatigued.Pt demonstrated no fatigue with aquatic exercise today. She and her family will be  looking/touring the [place] exercise facility which would include the theraputic pool option.  ? Personal Factors and Comorbidities Comorbidity 3+   ? Comorbidities Multiple Myeloma, multi joint OA, recent foot surgery, fall risk,SCT   ? Examination-Activity Limitations Sleep;Squat;Stand;Stairs;Transfers;Locomotion Level;Sit   ? Examination-Participation Restrictions Cleaning;Community Activity;Occupation;Shop   ? Stability/Clinical Decision Making Stable/Uncomplicated   ? Rehab Potential Good   ? PT Frequency 3x / week   ? PT Duration 4 weeks   ? PT Treatment/Interventions ADLs/Self Care Home Management;Aquatic Therapy;Gait training;Stair training;Functional mobility training;Therapeutic activities;Therapeutic exercise;Balance training;Neuromuscular re-education;Manual techniques;Patient/family education;Passive range of motion;Joint Manipulations   ? PT Next Visit Plan continue aquatics 2x/week, land therapy 1x per week, pt to search out other community exercise opportunites, continue balance, strength, endurance activities   ? Recommended Other Services seek out community based activities to begin after her release in May   ? Consulted and Agree with Plan of Care Patient   ? ?  ?  ? ?  ? ? ?Patient will benefit from skilled therapeutic intervention in order to improve the following deficits and impairments:    ? ?Visit Diagnosis: ?Abnormal posture ? ?Multiple myeloma not having achieved remission ([place]) ? ?Fatigue, unspecified type ? ?Muscle weakness (generalized) ? ?Difficulty in walking, not elsewhere classified ? ? ? ? ?Problem List ?Patient Active Problem List  ? Diagnosis Date Noted  ? Multiple myeloma ([place]) 07/13/2020  ? Claw toe, right   ? Trigger finger, left middle finger 04/09/2020  ? Stenosing tenosynovitis of finger of left hand 02/27/2020  ? 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 Major depression, recurrent ([place]) 01/12/2018  ? Chronic right shoulder pain 12/20/2017  ? MDD (major depressive disorder),  recurrent episode, moderate ([place]) 11/04/2017  ? MGUS (monoclonal gammopathy of unknown significance) 10/24/2017  ? Primary osteoarthritis of both hands 09/26/2017  ? Primary osteoarthritis of right

## 2021-09-15 ENCOUNTER — Ambulatory Visit: Payer: BC Managed Care – PPO | Admitting: Physical Therapy

## 2021-09-15 ENCOUNTER — Other Ambulatory Visit: Payer: Self-pay | Admitting: Hematology

## 2021-09-15 ENCOUNTER — Encounter: Payer: Self-pay | Admitting: Physical Therapy

## 2021-09-15 DIAGNOSIS — R5383 Other fatigue: Secondary | ICD-10-CM | POA: Diagnosis not present

## 2021-09-15 DIAGNOSIS — R262 Difficulty in walking, not elsewhere classified: Secondary | ICD-10-CM | POA: Diagnosis not present

## 2021-09-15 DIAGNOSIS — M25561 Pain in right knee: Secondary | ICD-10-CM | POA: Diagnosis not present

## 2021-09-15 DIAGNOSIS — R293 Abnormal posture: Secondary | ICD-10-CM | POA: Diagnosis not present

## 2021-09-15 DIAGNOSIS — C9000 Multiple myeloma not having achieved remission: Secondary | ICD-10-CM

## 2021-09-15 DIAGNOSIS — M6281 Muscle weakness (generalized): Secondary | ICD-10-CM | POA: Diagnosis not present

## 2021-09-15 DIAGNOSIS — G8929 Other chronic pain: Secondary | ICD-10-CM | POA: Diagnosis not present

## 2021-09-20 ENCOUNTER — Ambulatory Visit: Payer: BC Managed Care – PPO

## 2021-09-20 DIAGNOSIS — R5383 Other fatigue: Secondary | ICD-10-CM | POA: Diagnosis not present

## 2021-09-20 DIAGNOSIS — R262 Difficulty in walking, not elsewhere classified: Secondary | ICD-10-CM

## 2021-09-20 DIAGNOSIS — C9000 Multiple myeloma not having achieved remission: Secondary | ICD-10-CM

## 2021-09-20 DIAGNOSIS — M6281 Muscle weakness (generalized): Secondary | ICD-10-CM

## 2021-09-20 DIAGNOSIS — G8929 Other chronic pain: Secondary | ICD-10-CM | POA: Diagnosis not present

## 2021-09-20 DIAGNOSIS — R293 Abnormal posture: Secondary | ICD-10-CM

## 2021-09-20 DIAGNOSIS — M25561 Pain in right knee: Secondary | ICD-10-CM | POA: Diagnosis not present

## 2021-09-20 NOTE — Therapy (Signed)
[facility] ?[facility] ?[place], [place], [address] ?Phone: [phone]   Fax:  [phone] ? ?Physical Therapy Treatment ? ?Patient Details  ?Name: Hannah [name] ?MRN: [id number] ?Date of Birth: [birth year] ?Referring Provider (PT): Cira Rue ? ? ?Encounter Date: 09/20/2021 ? ? PT End of Session - 09/20/21 0817   ? ? Visit Number 60   ? Number of Visits 70   ? Date for PT Re-Evaluation 10/12/21   ? Authorization Type BCBS   ? PT Start Time 0813   pt late  ? PT Stop Time [phone]   ? PT Time Calculation (min) 39 min   ? Activity Tolerance Patient tolerated treatment well   ? Behavior During Therapy [facility] for tasks assessed/performed   ? ?  ?  ? ?  ? ? ?Past Medical History:  ?Diagnosis Date  ? Allergy   ? Anemia   ? Anxiety   ? Arthritis   ? Asthma   ? Depression   ? Hypertension   ? ? ?Past Surgical History:  ?Procedure Laterality Date  ? CHOLECYSTECTOMY  1993  ? COLONOSCOPY    ? TOTAL KNEE ARTHROPLASTY Right 01/28/2016  ? TOTAL KNEE ARTHROPLASTY Right 01/28/2016  ? Procedure: RIGHT TOTAL KNEE ARTHROPLASTY;  Surgeon: [name], MD;  Location: [facility];  Service: Orthopedics;  Laterality: Right;  ? TRIGGER FINGER RELEASE Right 06/18/2014  ? Procedure: RIGHT LONG FINGER TRIGGER RELEASE;  Surgeon: [name], MD;  Location: [facility];  Service: Orthopedics;  Laterality: Right;  ? TUBAL LIGATION    ? VAGINAL HYSTERECTOMY Bilateral 11/04/2016  ? Procedure: HYSTERECTOMY VAGINAL with Bilateral Salpingectomy;  Surgeon: [name], MD;  Location: [facility];  Service: Gynecology;  Laterality: Bilateral;  ? WEIL OSTEOTOMY Right 07/07/2020  ? Procedure: RIGHT FOOT WEIL OSTEOTOMY 2, 3, AND 4 METATARSALS AND PROXIMAL INTERPHALANGEAL JOINT FUSION 2 & 4 TOES;  Surgeon: Newt Minion, MD;  Location: [facility];  Service: Orthopedics;  Laterality: Right;  ? ? ?There were no vitals filed for this visit. ? ? Subjective Assessment - 09/20/21  0814   ? ? Subjective Did OK after last visit. 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   ? 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 She as diagnosed in February 2022 with Multiple Myeloma not in remission. 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 Limitations Walking   ? How long can you sit comfortably? no limits   ? How long can you stand comfortably? 55mn   ? How long can you walk comfortably? 531m   ? Diagnostic tests Recent lumbar MRI; no NED   ? Patient Stated Goals get strong, build up the muscles   ? Currently in Pain? No/denies   ? Pain Score 5    ? Pain Location Shoulder   ? Pain Orientation Right   ? Pain Descriptors / Indicators Aching   ? Pain Type Chronic pain   ? Pain Onset More than a month ago   ? Pain Frequency Constant   ? Multiple Pain Sites No   ? Pain Onset More than a month ago   ? Pain Frequency Intermittent   ? ?  ?  ? ?  ? ? ? ? ? ? ? ? ? ? ? ? ? ? ? ? ? ? ? ?  [facility] PT Treatment/Exercise - 09/20/21 0001   ? ?  ? Exercises  ? Other Exercises  Nu step seat and UE 9, lev 5 x 6 min, mini squats x 10, heel raises without UE forward and side step ups 5 in x10 B,Scapular retraction and shoulder extension with yellow band x 10, LAQ 3 # 2 x10 ea, PPT x 10, PPT with alternate arm and leg raises and hand to knee. PPT with SLR 2 x 5, SLL with knees flexed 2 x 10, forward and lateral step and hold on ax x 10 ea bilaterally, ball rolls on wall flexion x 10   ? ?  ?  ? ?  ? ? ? ? ? ? ? ? ? ? ? ? ? ? ? PT Long Term Goals - 09/14/21 0813   ? ?  ? PT LONG TERM GOAL #1  ? Title Pt will return to the gym   ? Baseline not yet, but improving and concerned about covid/   ? Time 4   ? Period Weeks   ? Status On-going   ? Target Date 10/12/21   ?  ? PT LONG TERM GOAL #2  ? Title Pt will improve 3mn walk test to at least 10049fto demonstrate decreased fall risk    ? Baseline 643, 1061 today   ? Time 8   ? Period Weeks   ? Status Achieved   ? Target Date 05/18/21   ?  ? PT LONG TERM GOAL #3  ? Title Pt will be able to perform a 5 x sts in 20 sec or less showing improved LE strength and   ? Period Weeks   ? Status Achieved   ? Target Date 05/18/21   ?  ? PT LONG TERM GOAL #4  ? Title Pt will be able to go up and down stairs at home   ? Time 8   ? Period Weeks   ? Status Achieved   ? Target Date 12/14/20   ?  ? PT LONG TERM GOAL #5  ? Title Able to hold tandem stance and SLS for 10 seconds.   ? Baseline 35 sec tandem each side stopped by PT, Right SLS 10, left 22   ? Time 8   ? Period Weeks   ? Status Achieved   ? Target Date 07/12/21   ?  ? PT LONG TERM GOAL #6  ? Title Able to walk 1250 ft in 6 min to demonstrate improved gait speed   ? Baseline 1063 feet today 09/14/2021   ? Time 4   ? Period Weeks   ? Status On-going   ? Target Date 10/12/21   ?  ? PT LONG TERM GOAL #7  ? Title Pt will improve right shoulder ROM for flexion and abd to atleast revised to 95 degrees for improved ability to reach.   ? Status On-going   ? Target Date 10/12/21   ?  ? PT LONG TERM GOAL #8  ? Title Pt will improve bilateral grip strength to atleast 30 # for improved function   ? Baseline achieved for left, not right(27)   ? Time 4   ? Period Weeks   ? Status Partially Met   ? Target Date 10/12/21   ? ?  ?  ? ?  ? ? ? ? ? ? ? ? Plan - 09/20/21 0818   ? ? Clinical Impression Statement Continued with emphasis on UE, LE, and core  strength and balance.  Pt was more fatigued today when starting therapy and required a change of positions for rest break from standing to sitting and mat work, before returning to standing again. She did well with core stabs and balance and did not require hand hold for balance activities.   ? Personal Factors and Comorbidities Comorbidity 3+   ? Comorbidities Multiple Myeloma, multi joint OA, recent foot surgery, fall risk,SCT   ? Examination-Activity Limitations  Sleep;Squat;Stand;Stairs;Transfers;Locomotion Level;Sit   ? Examination-Participation Restrictions Cleaning;Community Activity;Occupation;Shop   ? Stability/Clinical Decision Making Stable/Uncomplicated   ? Rehab Potential Good   ? PT Frequency 3x / week   ? PT Duration 4 weeks   ? PT Treatment/Interventions ADLs/Self Care Home Management;Aquatic Therapy;Gait training;Stair training;Functional mobility training;Therapeutic activities;Therapeutic exercise;Balance training;Neuromuscular re-education;Manual techniques;Patient/family education;Passive range of motion;Joint Manipulations   ? PT Next Visit Plan continue aquatics 2x/week, land therapy 1x per week, pt to search out other community exercise opportunites, continue balance, strength, endurance activities   ? Consulted and Agree with Plan of Care Patient   ? ?  ?  ? ?  ? ? ?Patient will benefit from skilled therapeutic intervention in order to improve the following deficits and impairments:  Abnormal gait, Decreased mobility, Decreased activity tolerance, Decreased endurance, Decreased strength, Decreased balance, Decreased knowledge of precautions, Difficulty walking, Postural dysfunction, Pain ? ?Visit Diagnosis: ?Abnormal posture ? ?Multiple myeloma not having achieved remission ([place]) ? ?Fatigue, unspecified type ? ?Muscle weakness (generalized) ? ?Difficulty in walking, not elsewhere classified ? ? ? ? ?Problem List ?Patient Active Problem List  ? Diagnosis Date Noted  ? Multiple myeloma ([place]) 07/13/2020  ? Claw toe, right   ? Trigger finger, left middle finger 04/09/2020  ? Stenosing tenosynovitis of finger of left hand 02/27/2020  ? 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 Major depression, recurrent ([place]) 01/12/2018  ? Chronic right shoulder pain 12/20/2017  ? MDD (major depressive disorder), recurrent episode, moderate ([place]) 11/04/2017  ? MGUS (monoclonal gammopathy of unknown significance) 10/24/2017  ? Primary osteoarthritis of both hands 09/26/2017  ?  Primary osteoarthritis of right shoulder 09/26/2017  ? Primary osteoarthritis of both hips 09/26/2017  ? Status post total knee replacement, right 09/26/2017  ? Primary osteoarthritis of both feet 09/26/2017  ? His

## 2021-09-22 ENCOUNTER — Ambulatory Visit: Payer: BC Managed Care – PPO | Admitting: Physical Therapy

## 2021-09-22 ENCOUNTER — Encounter: Payer: Self-pay | Admitting: Physical Therapy

## 2021-09-22 DIAGNOSIS — R5383 Other fatigue: Secondary | ICD-10-CM | POA: Diagnosis not present

## 2021-09-22 DIAGNOSIS — R262 Difficulty in walking, not elsewhere classified: Secondary | ICD-10-CM | POA: Diagnosis not present

## 2021-09-22 DIAGNOSIS — M6281 Muscle weakness (generalized): Secondary | ICD-10-CM

## 2021-09-22 DIAGNOSIS — G8929 Other chronic pain: Secondary | ICD-10-CM | POA: Diagnosis not present

## 2021-09-22 DIAGNOSIS — R293 Abnormal posture: Secondary | ICD-10-CM | POA: Diagnosis not present

## 2021-09-22 DIAGNOSIS — C9000 Multiple myeloma not having achieved remission: Secondary | ICD-10-CM

## 2021-09-22 DIAGNOSIS — M25561 Pain in right knee: Secondary | ICD-10-CM | POA: Diagnosis not present

## 2021-09-22 NOTE — Therapy (Signed)
?[facility] ?[place], [place], [address] ?Phone: [phone]   Fax:  [phone] ? ?Physical Therapy Treatment ? ?Patient Details  ?Name: Hannah [name] ?MRN: [id number] ?Date of Birth: Jun 01, 1960 ?Referring Provider (PT): Cira Rue ? ? ?Encounter Date: 09/22/2021 ? ? PT End of Session - 09/22/21 1013   ? ? Visit Number 51   ? Number of Visits 70   ? Date for PT Re-Evaluation 10/12/21   ? Authorization Type BCBS   ? PT Start Time [phone]   ? PT Stop Time 0930   ? PT Time Calculation (min) 40 min   ? Activity Tolerance Patient tolerated treatment well   ? Behavior During Therapy [facility] for tasks assessed/performed   ? ?  ?  ? ?  ? ? ?Past Medical History:  ?Diagnosis Date  ? Allergy   ? Anemia   ? Anxiety   ? Arthritis   ? Asthma   ? Depression   ? Hypertension   ? ? ?Past Surgical History:  ?Procedure Laterality Date  ? CHOLECYSTECTOMY  1993  ? COLONOSCOPY    ? TOTAL KNEE ARTHROPLASTY Right 01/28/2016  ? TOTAL KNEE ARTHROPLASTY Right 01/28/2016  ? Procedure: RIGHT TOTAL KNEE ARTHROPLASTY;  Surgeon: [name], MD;  Location: [facility];  Service: Orthopedics;  Laterality: Right;  ? TRIGGER FINGER RELEASE Right 06/18/2014  ? Procedure: RIGHT LONG FINGER TRIGGER RELEASE;  Surgeon: [name], MD;  Location: [facility];  Service: Orthopedics;  Laterality: Right;  ? TUBAL LIGATION    ? VAGINAL HYSTERECTOMY Bilateral 11/04/2016  ? Procedure: HYSTERECTOMY VAGINAL with Bilateral Salpingectomy;  Surgeon: [name], MD;  Location: [facility];  Service: Gynecology;  Laterality: Bilateral;  ? WEIL OSTEOTOMY Right 07/07/2020  ? Procedure: RIGHT FOOT WEIL OSTEOTOMY 2, 3, AND 4 METATARSALS AND PROXIMAL INTERPHALANGEAL JOINT FUSION 2 & 4 TOES;  Surgeon: Newt Minion, MD;  Location: [facility];  Service: Orthopedics;  Laterality: Right;  ? ? ?There were no vitals filed for this visit. ? ? Subjective Assessment - 09/22/21 1018   ? ?  Subjective No complaints this AM.   ? Currently in Pain? No/denies   ? ?  ?  ? ?  ?Treatment: Pt arrives for aquatic physical therapy. Treatment took place in 3.5-5.5 feet of water. Water temperature was 92 degrees F. Pt entered the pool via stairs with mild use of rails. Pt requires buoyancy of water for support and to offload joints with strengthening exercises.  Pt utilizes viscosity of the water required for strengthening.  ?Standing in 50%- 75% depth water pt performed water walking in all 4 directions 10x. VC for speed in order to generate appropriate current for resistance and/or UE movements. Hand paddles added for side stepping.   ? ?Wall exercises to inc: Hip 3 ways 20x ea with min support of pool wall. VC to push/pull against water with a force appropriate. Core contractions in partial wall squat with neck pillow  5 sec hold 10x. Bil heel raises 20x no UE support. High knee marching with UE weights 4 lengths. . Hand paddles for shoulder ext/flex 2x10, horizontal add/abd 2x 10, VC to maintain core throughout exercises. Underwater bicycle with large noodle behind pt for 5 min. Blue noodle knee extensions 10x Bil. Kickboard resistive trunk rotations 10x. ? ? ? ? ? ? ? ? ? ? ? ? ? ? ? ? ? ? ? ? ? ? ? ? ? ? ? ? ? ? ? ? ? ? ?  PT Long Term Goals - 09/14/21 0813   ? ?  ? PT LONG TERM GOAL #1  ? Title Pt will return to the gym   ? Baseline not yet, but improving and concerned about covid/   ? Time 4   ? Period Weeks   ? Status On-going   ? Target Date 10/12/21   ?  ? PT LONG TERM GOAL #2  ? Title Pt will improve 70min walk test to at least 1058ft to demonstrate decreased fall risk   ? Baseline 643, 1061 today   ? Time 8   ? Period Weeks   ? Status Achieved   ? Target Date 05/18/21   ?  ? PT LONG TERM GOAL #3  ? Title Pt will be able to perform a 5 x sts in 20 sec or less showing improved LE strength and   ? Period Weeks   ? Status Achieved   ? Target Date 05/18/21   ?  ? PT LONG TERM GOAL #4  ? Title Pt will be  able to go up and down stairs at home   ? Time 8   ? Period Weeks   ? Status Achieved   ? Target Date 12/14/20   ?  ? PT LONG TERM GOAL #5  ? Title Able to hold tandem stance and SLS for 10 seconds.   ? Baseline 35 sec tandem each side stopped by PT, Right SLS 10, left 22   ? Time 8   ? Period Weeks   ? Status Achieved   ? Target Date 07/12/21   ?  ? PT LONG TERM GOAL #6  ? Title Able to walk 1250 ft in 6 min to demonstrate improved gait speed   ? Baseline 1063 feet today 09/14/2021   ? Time 4   ? Period Weeks   ? Status On-going   ? Target Date 10/12/21   ?  ? PT LONG TERM GOAL #7  ? Title Pt will improve right shoulder ROM for flexion and abd to atleast revised to 95 degrees for improved ability to reach.   ? Status On-going   ? Target Date 10/12/21   ?  ? PT LONG TERM GOAL #8  ? Title Pt will improve bilateral grip strength to atleast 30 # for improved function   ? Baseline achieved for left, not right(27)   ? Time 4   ? Period Weeks   ? Status Partially Met   ? Target Date 10/12/21   ? ?  ?  ? ?  ? ? ? ? ? ? ? ? Plan - 09/22/21 1019   ? ? Clinical Impression Statement Pt arrives for aquatic PT today with no significnt issues; pain or fatigue. pt is trying to learn when her back hurts how much movement helps vs too much where she feels fatigue. No issues with water exercises today.   ? PT Next Visit Plan continue aquatics 2x/week, land therapy 1x per week, pt to search out other community exercise opportunites, continue balance, strength, endurance activities   ? Consulted and Agree with Plan of Care Patient   ? ?  ?  ? ?  ? ? ? ?Patient will benefit from skilled therapeutic intervention in order to improve the following deficits and impairments:    ? ?Visit Diagnosis: ?Abnormal posture ? ?Multiple myeloma not having achieved remission ([place]) ? ?Fatigue, unspecified type ? ?Muscle weakness (generalized) ? ? ? ? ?Problem List ?Patient Active Problem List  ?  Diagnosis Date Noted  ? Multiple myeloma ([place]) 07/13/2020   ? Claw toe, right   ? Trigger finger, left middle finger 04/09/2020  ? Stenosing tenosynovitis of finger of left hand 02/27/2020  ? 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 Major depression, recurrent ([place]) 01/12/2018  ? Chronic right shoulder pain 12/20/2017  ? MDD (major depressive disorder), recurrent episode, moderate ([place]) 11/04/2017  ? MGUS (monoclonal gammopathy of unknown significance) 10/24/2017  ? Primary osteoarthritis of both hands 09/26/2017  ? Primary osteoarthritis of right shoulder 09/26/2017  ? Primary osteoarthritis of both hips 09/26/2017  ? Status post total knee replacement, right 09/26/2017  ? Primary osteoarthritis of both feet 09/26/2017  ? History of asthma 09/26/2017  ? Primary osteoarthritis of left knee 04/18/2017  ? Menorrhagia 11/04/2016  ? Fibroid tumor 11/04/2016  ? Adenomyosis 11/04/2016  ? Hypertension, essential 11/04/2016  ? Total knee replacement status 01/28/2016  ? ? ?Treyvone Chelf, PTA ?09/22/2021, 10:20 AM ? ?[facility] ?[place], [place], [address] ?Phone: [phone]   Fax:  [phone] ? ?Name: MAYLINE DRAGON ?MRN: [id number] ?Date of Birth: 07-29-1960 ? ? ? ?

## 2021-09-23 ENCOUNTER — Other Ambulatory Visit: Payer: Self-pay

## 2021-09-23 DIAGNOSIS — C9001 Multiple myeloma in remission: Secondary | ICD-10-CM

## 2021-09-23 DIAGNOSIS — C9000 Multiple myeloma not having achieved remission: Secondary | ICD-10-CM

## 2021-09-24 ENCOUNTER — Inpatient Hospital Stay (HOSPITAL_BASED_OUTPATIENT_CLINIC_OR_DEPARTMENT_OTHER): Payer: BC Managed Care – PPO | Admitting: Hematology

## 2021-09-24 ENCOUNTER — Inpatient Hospital Stay: Payer: BC Managed Care – PPO | Attending: Hematology

## 2021-09-24 ENCOUNTER — Other Ambulatory Visit: Payer: Self-pay

## 2021-09-24 ENCOUNTER — Encounter: Payer: Self-pay | Admitting: Hematology

## 2021-09-24 VITALS — BP 154/90 | HR 80 | Temp 98.4°F | Resp 17 | Wt 210.0 lb

## 2021-09-24 DIAGNOSIS — E669 Obesity, unspecified: Secondary | ICD-10-CM | POA: Insufficient documentation

## 2021-09-24 DIAGNOSIS — C9000 Multiple myeloma not having achieved remission: Secondary | ICD-10-CM | POA: Insufficient documentation

## 2021-09-24 DIAGNOSIS — Z9484 Stem cells transplant status: Secondary | ICD-10-CM | POA: Diagnosis not present

## 2021-09-24 DIAGNOSIS — Z882 Allergy status to sulfonamides status: Secondary | ICD-10-CM | POA: Insufficient documentation

## 2021-09-24 DIAGNOSIS — Z9079 Acquired absence of other genital organ(s): Secondary | ICD-10-CM | POA: Diagnosis not present

## 2021-09-24 DIAGNOSIS — I10 Essential (primary) hypertension: Secondary | ICD-10-CM | POA: Diagnosis not present

## 2021-09-24 DIAGNOSIS — F419 Anxiety disorder, unspecified: Secondary | ICD-10-CM | POA: Diagnosis not present

## 2021-09-24 DIAGNOSIS — C9001 Multiple myeloma in remission: Secondary | ICD-10-CM

## 2021-09-24 DIAGNOSIS — F32A Depression, unspecified: Secondary | ICD-10-CM | POA: Insufficient documentation

## 2021-09-24 DIAGNOSIS — Z923 Personal history of irradiation: Secondary | ICD-10-CM | POA: Diagnosis not present

## 2021-09-24 DIAGNOSIS — Z79899 Other long term (current) drug therapy: Secondary | ICD-10-CM | POA: Diagnosis not present

## 2021-09-24 DIAGNOSIS — Z7961 Long term (current) use of immunomodulator: Secondary | ICD-10-CM | POA: Diagnosis not present

## 2021-09-24 DIAGNOSIS — M545 Low back pain, unspecified: Secondary | ICD-10-CM | POA: Insufficient documentation

## 2021-09-24 DIAGNOSIS — M25551 Pain in right hip: Secondary | ICD-10-CM | POA: Diagnosis not present

## 2021-09-24 DIAGNOSIS — D509 Iron deficiency anemia, unspecified: Secondary | ICD-10-CM | POA: Insufficient documentation

## 2021-09-24 DIAGNOSIS — Z9049 Acquired absence of other specified parts of digestive tract: Secondary | ICD-10-CM | POA: Diagnosis not present

## 2021-09-24 LAB — CMP (CANCER CENTER ONLY)
ALT: 10 U/L (ref 0–44)
AST: 14 U/L — ABNORMAL LOW (ref 15–41)
Albumin: 3.7 g/dL (ref 3.5–5.0)
Alkaline Phosphatase: 73 U/L (ref 38–126)
Anion gap: 5 (ref 5–15)
BUN: 16 mg/dL (ref 6–20)
CO2: 29 mmol/L (ref 22–32)
Calcium: 8.9 mg/dL (ref 8.9–10.3)
Chloride: 107 mmol/L (ref 98–111)
Creatinine: 0.76 mg/dL (ref 0.44–1.00)
GFR, Estimated: >60 mL/min (ref >60)
Glucose, Bld: 108 mg/dL — ABNORMAL HIGH (ref 70–99)
Potassium: 3.7 mmol/L (ref 3.5–5.1)
Sodium: 141 mmol/L (ref 135–145)
Total Bilirubin: 0.3 mg/dL (ref 0.3–1.2)
Total Protein: 7.3 g/dL (ref 6.5–8.1)

## 2021-09-24 LAB — CBC WITH DIFFERENTIAL (CANCER CENTER ONLY)
Abs Immature Granulocytes: 0.02 10*3/uL (ref 0.00–0.07)
Basophils Absolute: 0 10*3/uL (ref 0.0–0.1)
Basophils Relative: 1 %
Eosinophils Absolute: 0.6 10*3/uL — ABNORMAL HIGH (ref 0.0–0.5)
Eosinophils Relative: 8 %
HCT: 34.9 % — ABNORMAL LOW (ref 36.0–46.0)
Hemoglobin: 11.5 g/dL — ABNORMAL LOW (ref 12.0–15.0)
Immature Granulocytes: 0 %
Lymphocytes Relative: 32 %
Lymphs Abs: 2.4 10*3/uL (ref 0.7–4.0)
MCH: 22.8 pg — ABNORMAL LOW (ref 26.0–34.0)
MCHC: 33 g/dL (ref 30.0–36.0)
MCV: 69.2 fL — ABNORMAL LOW (ref 80.0–100.0)
Monocytes Absolute: 0.5 10*3/uL (ref 0.1–1.0)
Monocytes Relative: 6 %
Neutro Abs: 3.9 10*3/uL (ref 1.7–7.7)
Neutrophils Relative %: 53 %
Platelet Count: 209 10*3/uL (ref 150–400)
RBC: 5.04 MIL/uL (ref 3.87–5.11)
RDW: 18.8 % — ABNORMAL HIGH (ref 11.5–15.5)
WBC Count: 7.4 10*3/uL (ref 4.0–10.5)
nRBC: 0 % (ref 0.0–0.2)

## 2021-09-24 NOTE — Progress Notes (Signed)
?[facility]   ?[phone] [phone]   ?Clinic Follow up Note  ? ?Patient Care Team: ?Tisovec, Fransico Him, MD as PCP - General (Internal Medicine) ?Bo Merino, MD as Consulting Physician (Rheumatology) ?Truitt Merle, MD as Consulting Physician (Hematology) ?Waymon Amato, MD as Consulting Physician (Obstetrics and Gynecology) ? ?Date of Service:  09/24/2021 ? ?CHIEF COMPLAINT: f/u of IgA kappa multiple myeloma ? ?CURRENT THERAPY:  ?-Maintenance Revlimid 10 mg, days 1-21 q. 28 days started 05/03/21 ?-Zometa q83months, restarted 05/28/21 ? ?ASSESSMENT & PLAN:  ?Hannah [name] is a 61 y.o. female with  ? ?1. 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   ?-staging bone marrow biopsy on 07/30/20 showed slightly hypercellular marrow for age and 31% plasma cells. Cytogenetics and FISH were unremarkable ?-she received VRD (weekly Velcade and dexamethasone, Revlimid 2 weeks on 1 week off) on 08/17/20 - June 2022. She responded well. ?-She underwent high-dose chemo with melphalan and autologous stem cell transplant at [facility] 12/23/20 by Dr. Aris Lot.  White cells engrafted 01/03/21 and platelets 01/13/21, last platelet transfusion 01/02/21 ?-posttreatment PET 04/06/21 showed NED  ?-posttreatment bone marrow biopsy 04/29/21 showed 10-20% hypocellular marrow with myeloid hypoplasia and 3% plasma cells ?-Continue prophylaxis with valacyclovir for 1 year and dapsone  for 6 months posttransplant; revaccinate per [facility] protocol ?-she began maintenance Revlimid 10 mg daily days 1-21 q. 28 days on 05/03/21. She is tolerating well with no noticeable side effects.  She has recovered well from pulmonary transplant. ?-labs reviewed, overall stable, will continue Revlimid. ?  ?2. Low back pain, right hip pain ?-work up per ortho showed multiple T1 lesions of her spine and sacrum compatible with MM on her 07/03/20 MRI lumbar spine. ?-PET 07/24/20 showed large hypermetabolic lesions in the left and right pelvis and a faintly metabolic lesion in the left scapula ?-She completed palliative radiation to the pelvis, left scapula, T4-6, and T10-12 on 3/3-3/16/22. Pain resolved now. ?-She received 4 doses of monthly Zometa 3/7-11/02/20, resumed 05/28/21, will continue every 3 months for a total of 2 years  ?-repeat lumbar spine MRI on 09/02/21 showed interval treatment response with regression of large pelvis deposits and resolution of widespread lumbar involvement. ?  ?3. Anemia, hemoglobin C trait ?-She appears to have chronic mild microcytic anemia, likely a component of iron deficiency. ?-Hemoglobin electrophoresis showed increase in Hg C, consistent with Hg C trait.  She is aware her children are at risk of hemoglobin C trait ?-Continue supplements per transplant team; she is no longer on oral iron. ?-hgb improving, 11.5 today (09/24/21) ?  ?4. HTN, OA, Obesity, Cancer Screenings ?-On chlorthalidone for HTN ?-She will continue to f/u with PCP, Chiropractor and Ortho.  ?-she is up-to-date on appropriate screening. Colonoscopy due 10/2023. ?  ?5. Anxiety, depression ?-under care of [facility] provider, tolerating Zoloft ?-She has strong faith and good family/social support.  She feels very positive about her experience and wants to share with others ?  ?  ?Plan: ?-continue Revlimid 10 mg  p.o. daily 3 weeks on/1 week off ?-lab, f/u, and Zometa in 2 months ? ? ?No problem-specific Assessment & Plan notes  found for this encounter. ? ? ?SUMMARY OF ONCOLOGIC HISTORY: ?Oncology History Overview Note  ?Cancer Staging ?Multiple myeloma ([place]) ?Staging form: Plasma Cell Myeloma and Plasma Cell Disorders, AJCC 8th Edition ?- Clinical stage from 07/20/2020: Beta-2-microglobulin (mg/L): 2.4, Albumin (g/dL): 3.2, ISS: Stage II, High-risk cytogenetics: Unknown, LDH: Normal - Signed by Truitt Merle, MD on 08/02/2020 ?Beta 2 microglobulin range (mg/L): Less than 3.5 ?Albumin range (g/dL): Less than 3.5 ?Cytogenetics: Unknown ? ?  ?Multiple myeloma ([place])  ?07/03/2020 Imaging  ? MRI Lumbar Spine  ?IMPRESSION: ?1. Numerous T1 hypointense and STIR hyperintense lesions throughout ?the visualized spine and sacrum with multiple areas of extraosseous ?extension in the sacrum, detailed above and compatible with multiple ?myeloma given the clinical history. ?2. An MRI of the pelvis with contrast could evaluate the full extent ?of the partially imaged sacral lesions, including left S1-S2 neural ?foraminal involvement and suspected involvement of the exited left ?L5 nerve. ?3. Multilevel degenerative change without significant canal or ?foraminal stenosis in the lumbar spine. ?  ?07/13/2020 Initial Diagnosis  ? Multiple myeloma ([place]) ? ?  ?07/20/2020 Cancer Staging  ? Staging form: Plasma Cell Myeloma and Plasma Cell Disorders, AJCC 8th Edition ?- Clinical stage from 07/20/2020: Beta-2-microglobulin (mg/L): 2.4, Albumin (g/dL): 3.2, ISS: Stage II, High-risk cytogenetics: Unknown, LDH: Normal - Signed by Truitt Merle, MD on 08/31/2020 ?Beta 2 microglobulin range (mg/L): Less than 3.5 ?Albumin range (g/dL): Less than 3.5 ?Cytogenetics: No abnormalities ?Bone disease on imaging: Present ? ?  ?07/24/2020 PET scan  ? IMPRESSION: ?1. Moderate to high hypermetabolic activity associated with the ?expansile soft tissue masses within the LEFT and RIGHT pelvic bones ?is consistent with active multiple myeloma / plasmacytoma. ?2. Lytic lesion in the LEFT scapula with  mild metabolic activity is ?indeterminate. ?3. Metabolic activity associated with the RIGHT knee prosthetic and ?RIGHT distal foot fusion is favored post procedural inflammation. ?  ?  ?07/30/2020 - 08/12/2020 Radiation Therapy  ? Palliative Radiation to Pelvic lesions with Dr Lisbeth Renshaw  ?  ?07/30/2020 Pathology Results  ? DIAGNOSIS:  ? ?BONE MARROW, ASPIRATE, CLOT, CORE:  ?-Normocellular to slightly hypercellular bone marrow for age with  ?plasmacytosis  ?-See comment  ? ?PERIPHERAL BLOOD:  ?-Microcytic-normochromic anemia  ? ?COMMENT:  ? ?The bone marrow shows increased number of atypical plasma cells  ?representing 31% of all cells in the aspirate associated with prominent  ?interstitial infiltrates and numerous variably sized clusters in the  ?clot and biopsy sections.  The findings are most consistent with  ?persistent/recurrent/previously known plasma cell neoplasm.  For  ?completeness, immunohistochemical stain for CD138 and in situ  ?hybridization for kappa and lambda will be performed and the results  ?reported in an addendum.  Correlation with cytogenetic and FISH studies  ?is recommended. ?  ?08/03/2020 -  Chemotherapy  ? Zometa q4weeks starting 08/03/20 ? ?  ?08/17/2020 - 11/29/2020 Chemotherapy  ? Velcade weekly, Revlimid, dex 87m weekly (VRd)  Starting 08/17/20. Last dose Velcade 11/23/20 and last dose revlimid on 11/29/20 ? ?  ? ? ? ?INTERVAL HISTORY:  ?Hannah PUGAis here for a follow up of MM. She was last seen by me on 07/23/21. She presents to the clinic accompanied by her husband. ?She reports she is doing well overall. She has gained weight since her last visit, and she reports her appetite is good. She does admit to eating  junk food. ?She notes she is back to working. 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 ?  ?All other systems were reviewed with the patient and are negative. ? ?MEDICAL HISTORY:  ?Past Medical History:  ?Diagnosis Date  ? Allergy   ? Anemia   ? Anxiety   ? Arthritis   ? Asthma   ?  Depression   ? Hypertension   ? ? ?SURGICAL HISTORY: ?Past Surgical History:  ?Procedure Laterality Date  ? CHOLECYSTECTOMY  1993  ? COLONOSCOPY    ? TOTAL KNEE ARTHROPLASTY Right 01/28/2016  ? TOTAL KNEE ARTHROPL

## 2021-09-29 ENCOUNTER — Encounter: Payer: Self-pay | Admitting: Physical Therapy

## 2021-09-29 ENCOUNTER — Ambulatory Visit: Payer: BC Managed Care – PPO | Attending: Family | Admitting: Physical Therapy

## 2021-09-29 DIAGNOSIS — C9000 Multiple myeloma not having achieved remission: Secondary | ICD-10-CM | POA: Insufficient documentation

## 2021-09-29 DIAGNOSIS — G8929 Other chronic pain: Secondary | ICD-10-CM | POA: Insufficient documentation

## 2021-09-29 DIAGNOSIS — R262 Difficulty in walking, not elsewhere classified: Secondary | ICD-10-CM | POA: Insufficient documentation

## 2021-09-29 DIAGNOSIS — R5383 Other fatigue: Secondary | ICD-10-CM | POA: Insufficient documentation

## 2021-09-29 DIAGNOSIS — R293 Abnormal posture: Secondary | ICD-10-CM | POA: Diagnosis not present

## 2021-09-29 DIAGNOSIS — M25561 Pain in right knee: Secondary | ICD-10-CM | POA: Insufficient documentation

## 2021-09-29 DIAGNOSIS — M6281 Muscle weakness (generalized): Secondary | ICD-10-CM | POA: Insufficient documentation

## 2021-09-29 NOTE — Therapy (Signed)
[facility] ?[facility] ?[place], [place], [address] ?Phone: [phone]   Fax:  [phone] ? ?Physical Therapy Treatment ? ?Patient Details  ?Name: Hannah [name] ?MRN: [id number] ?Date of Birth: February 03, 1961 ?Referring Provider (PT): Cira Rue ? ? ?Encounter Date: 09/29/2021 ? ? PT End of Session - 09/29/21 6812   ? ? Visit Number 12   ? Number of Visits 70   ? Date for PT Re-Evaluation 10/12/21   ? Authorization Type BCBS   ? PT Start Time [phone]   ? PT Stop Time 0910   ? PT Time Calculation (min) 60 min   ? Activity Tolerance Patient tolerated treatment well   ? Behavior During Therapy [facility] for tasks assessed/performed   ? ?  ?  ? ?  ? ? ?Past Medical History:  ?Diagnosis Date  ? Allergy   ? Anemia   ? Anxiety   ? Arthritis   ? Asthma   ? Depression   ? Hypertension   ? ? ?Past Surgical History:  ?Procedure Laterality Date  ? CHOLECYSTECTOMY  1993  ? COLONOSCOPY    ? TOTAL KNEE ARTHROPLASTY Right 01/28/2016  ? TOTAL KNEE ARTHROPLASTY Right 01/28/2016  ? Procedure: RIGHT TOTAL KNEE ARTHROPLASTY;  Surgeon: [name], MD;  Location: [facility];  Service: Orthopedics;  Laterality: Right;  ? TRIGGER FINGER RELEASE Right 06/18/2014  ? Procedure: RIGHT LONG FINGER TRIGGER RELEASE;  Surgeon: [name], MD;  Location: [facility];  Service: Orthopedics;  Laterality: Right;  ? TUBAL LIGATION    ? VAGINAL HYSTERECTOMY Bilateral 11/04/2016  ? Procedure: HYSTERECTOMY VAGINAL with Bilateral Salpingectomy;  Surgeon: [name], MD;  Location: [facility];  Service: Gynecology;  Laterality: Bilateral;  ? WEIL OSTEOTOMY Right 07/07/2020  ? Procedure: RIGHT FOOT WEIL OSTEOTOMY 2, 3, AND 4 METATARSALS AND PROXIMAL INTERPHALANGEAL JOINT FUSION 2 & 4 TOES;  Surgeon: Newt Minion, MD;  Location: [facility];  Service: Orthopedics;  Laterality: Right;  ? ? ?There were no vitals filed for this visit. ? ? Subjective Assessment - 09/29/21 0915   ? ?  Subjective I fell/tripped going down the stairs Monday. I am ok now.   ? 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 She as diagnosed in February 2022 with Multiple Myeloma not in remission. 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 Currently in Pain? --   Has some sore places from fall but no significant pain.  ? ?  ?  ? ?  ? ?09/29/21 Aquatic treatment: Pt arrives for aquatic physical therapy. Treatment took place in 3.5-5.5 feet of water. Water temperature was 92 degrees F. Pt entered the pool via stairs with mild use of the rail. Pt requires buoyancy of water for support and to offload joints with strengthening exercises.  Pt utilizes viscosity of the water required for strengthening.  ? ?Standing in 50%- 75% depth water pt performed water walking in all 4 directions 10x. VC for speed in order to generate appropriate current for resistance and/or UE movements. Hand paddles added for side stepping.   ? ? ?Wall exercises to inc: Hip 3 ways 20x ea with min support of pool wall. VC to push/pull against water with a force appropriate. Core contractions in partial wall squat with blue noodle 5 sec hold 10x. High knee marching with UE weight press  downs 6x. Core compressions with neck pillow 5 sec hold 10x, UE weights for shoulder ext/flex 2x10, horizontal add/abd 2x 10, VC to maintain core throughout exercises. Underwater bicycle with large noodle behind pt 10 min with intermittent decompression hang. Small blue noodle standing knee ext 20x Bil.  ? ? ? ? ? ? ? ? ? ? ? ? ? ? ? ? ? ? ? ? ? ? ? ? ? ? ? ? ? PT Long Term Goals - 09/14/21 0813   ? ?  ? PT LONG TERM GOAL #1  ? Title Pt will return to the gym   ? Baseline not yet, but improving and concerned about covid/   ? Time 4   ? Period Weeks   ? Status On-going   ? Target Date 10/12/21   ?  ? PT LONG TERM GOAL #2  ? Title Pt will improve 46mn walk test to at  least 1001fto demonstrate decreased fall risk   ? Baseline 643, 1061 today   ? Time 8   ? Period Weeks   ? Status Achieved   ? Target Date 05/18/21   ?  ? PT LONG TERM GOAL #3  ? Title Pt will be able to perform a 5 x sts in 20 sec or less showing improved LE strength and   ? Period Weeks   ? Status Achieved   ? Target Date 05/18/21   ?  ? PT LONG TERM GOAL #4  ? Title Pt will be able to go up and down stairs at home   ? Time 8   ? Period Weeks   ? Status Achieved   ? Target Date 12/14/20   ?  ? PT LONG TERM GOAL #5  ? Title Able to hold tandem stance and SLS for 10 seconds.   ? Baseline 35 sec tandem each side stopped by PT, Right SLS 10, left 22   ? Time 8   ? Period Weeks   ? Status Achieved   ? Target Date 07/12/21   ?  ? PT LONG TERM GOAL #6  ? Title Able to walk 1250 ft in 6 min to demonstrate improved gait speed   ? Baseline 1063 feet today 09/14/2021   ? Time 4   ? Period Weeks   ? Status On-going   ? Target Date 10/12/21   ?  ? PT LONG TERM GOAL #7  ? Title Pt will improve right shoulder ROM for flexion and abd to atleast revised to 95 degrees for improved ability to reach.   ? Status On-going   ? Target Date 10/12/21   ?  ? PT LONG TERM GOAL #8  ? Title Pt will improve bilateral grip strength to atleast 30 # for improved function   ? Baseline achieved for left, not right(27)   ? Time 4   ? Period Weeks   ? Status Partially Met   ? Target Date 10/12/21   ? ?  ?  ? ?  ? ? ? ? ? ? ? ? Plan - 09/29/21 0916   ? ? Clinical Impression Statement Pt had fall on Monday but arrives today with no significant injuries from fall. Pt has plans to tour both [facility] and [facility] facilities with husband to decide which place may suit their family better as DC from PT approaches. Pt endurance showing consistency.   ? PT Next Visit Plan continue aquatics 2x/week, land therapy 1x per week, pt to search out  other community exercise opportunites, continue balance, strength, endurance activities   ? ?  ?  ? ?  ? ? ?Patient will  benefit from skilled therapeutic intervention in order to improve the following deficits and impairments:    ? ?Visit Diagnosis: ?Abnormal posture ? ?Multiple myeloma not having achieved remission ([place]) ? ?Fatigue, unspecified type ? ?Muscle weakness (generalized) ? ? ? ? ?Problem List ?Patient Active Problem List  ? Diagnosis Date Noted  ? Multiple myeloma ([place]) 07/13/2020  ? Claw toe, right   ? Trigger finger, left middle finger 04/09/2020  ? Stenosing tenosynovitis of finger of left hand 02/27/2020  ? 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 Major depression, recurrent ([place]) 01/12/2018  ? Chronic right shoulder pain 12/20/2017  ? MDD (major depressive disorder), recurrent episode, moderate ([place]) 11/04/2017  ? MGUS (monoclonal gammopathy of unknown significance) 10/24/2017  ? Primary osteoarthritis of both hands 09/26/2017  ? Primary osteoarthritis of right shoulder 09/26/2017  ? Primary osteoarthritis of both hips 09/26/2017  ? Status post total knee replacement, right 09/26/2017  ? Primary osteoarthritis of both feet 09/26/2017  ? History of asthma 09/26/2017  ? Primary osteoarthritis of left knee 04/18/2017  ? Menorrhagia 11/04/2016  ? Fibroid tumor 11/04/2016  ? Adenomyosis 11/04/2016  ? Hypertension, essential 11/04/2016  ? Total knee replacement status 01/28/2016  ? ? ?Chaz Mcglasson, PTA ?09/29/2021, 9:18 AM ? ?[facility] ?[facility] [place] ?[place], [place], [address] ?Phone: [phone]   Fax:  [phone] ? ?Name: Hannah [name] ?MRN: [id number] ?Date of Birth: Apr 04, 1961 ? ? ? ?

## 2021-09-30 ENCOUNTER — Telehealth: Payer: Self-pay | Admitting: Hematology

## 2021-09-30 NOTE — Telephone Encounter (Signed)
Scheduled follow-up appointment per 4/28 los. Patient is aware. 

## 2021-10-01 ENCOUNTER — Ambulatory Visit: Payer: BC Managed Care – PPO | Admitting: Physical Therapy

## 2021-10-01 ENCOUNTER — Encounter: Payer: Self-pay | Admitting: Physical Therapy

## 2021-10-01 DIAGNOSIS — M6281 Muscle weakness (generalized): Secondary | ICD-10-CM | POA: Diagnosis not present

## 2021-10-01 DIAGNOSIS — R293 Abnormal posture: Secondary | ICD-10-CM

## 2021-10-01 DIAGNOSIS — C9000 Multiple myeloma not having achieved remission: Secondary | ICD-10-CM

## 2021-10-01 DIAGNOSIS — R262 Difficulty in walking, not elsewhere classified: Secondary | ICD-10-CM | POA: Diagnosis not present

## 2021-10-01 DIAGNOSIS — R5383 Other fatigue: Secondary | ICD-10-CM

## 2021-10-01 DIAGNOSIS — G8929 Other chronic pain: Secondary | ICD-10-CM | POA: Diagnosis not present

## 2021-10-01 DIAGNOSIS — M25561 Pain in right knee: Secondary | ICD-10-CM | POA: Diagnosis not present

## 2021-10-01 NOTE — Therapy (Signed)
[facility] ?[facility] [place] ?[place], [place], [address] ?Phone: [phone]   Fax:  [phone] ? ?Physical Therapy Treatment ? ?Patient Details  ?Name: Hannah [name] ?MRN: [id number] ?Date of Birth: 07/27/1960 ?Referring Provider (PT): Cira Rue ? ? ?Encounter Date: 10/01/2021 ? ? PT End of Session - 10/01/21 1343   ? ? Visit Number 63   ? Number of Visits 70   ? Date for PT Re-Evaluation 10/12/21   ? Authorization Type BCBS   ? PT Start Time 1215   ? PT Stop Time 1300   ? PT Time Calculation (min) 45 min   ? Activity Tolerance Patient tolerated treatment well   ? Behavior During Therapy [facility] for tasks assessed/performed   ? ?  ?  ? ?  ? ? ?Past Medical History:  ?Diagnosis Date  ? Allergy   ? Anemia   ? Anxiety   ? Arthritis   ? Asthma   ? Depression   ? Hypertension   ? ? ?Past Surgical History:  ?Procedure Laterality Date  ? CHOLECYSTECTOMY  1993  ? COLONOSCOPY    ? TOTAL KNEE ARTHROPLASTY Right 01/28/2016  ? TOTAL KNEE ARTHROPLASTY Right 01/28/2016  ? Procedure: RIGHT TOTAL KNEE ARTHROPLASTY;  Surgeon: [name], MD;  Location: [facility];  Service: Orthopedics;  Laterality: Right;  ? TRIGGER FINGER RELEASE Right 06/18/2014  ? Procedure: RIGHT LONG FINGER TRIGGER RELEASE;  Surgeon: [name], MD;  Location: [facility];  Service: Orthopedics;  Laterality: Right;  ? TUBAL LIGATION    ? VAGINAL HYSTERECTOMY Bilateral 11/04/2016  ? Procedure: HYSTERECTOMY VAGINAL with Bilateral Salpingectomy;  Surgeon: [name], MD;  Location: [facility];  Service: Gynecology;  Laterality: Bilateral;  ? WEIL OSTEOTOMY Right 07/07/2020  ? Procedure: RIGHT FOOT WEIL OSTEOTOMY 2, 3, AND 4 METATARSALS AND PROXIMAL INTERPHALANGEAL JOINT FUSION 2 & 4 TOES;  Surgeon: Newt Minion, MD;  Location: [facility];  Service: Orthopedics;  Laterality: Right;  ? ? ?There were no vitals filed for this visit. ? ? Subjective Assessment - 10/01/21 1344   ? ?  Subjective No complaints, no pain   ? Currently in Pain? No/denies   ? ?  ?  ? ?  ? ? ?Treatment: Pt arrives for aquatic physical therapy. Treatment took place in 3.5-5.5 feet of water. Water temperature was 92 degrees F. Pt entered the pool via stairs with mild use of rails. Pt requires buoyancy of water for support and to offload joints with strengthening exercises.  Pt utilizes viscosity of the water required for strengthening.  ? ?Standing in 50%- 75% depth water pt performed water walking in all 4 directions 10x. VC for speed in order to generate appropriate current for resistance and/or UE movements. Hand paddles added for side stepping.  Wall exercises to inc: Hip 3 ways 20x ea with min support of pool wall. VC to push/pull against water with a force appropriate. Core contractions in partial wall squat with blue noodle 5 sec hold 10x. Bil heel raises 20x no UE support. High knee marching with UE push under water 6x. Hand paddles for shoulder ext/flex 2x10, horizontal add/abd 2x 10, VC to maintain core throughout exercises. Underwater bicycle with large noodle behind pt for 10 min. Kickboard trunk rotations 10x. Blue noodle knee extensions Bil 15x. ? ? ? ? ? ? ? ? ? ? ? ? ? ? ? ? ? ? ? ? ? ? ? ? ? ? ? ? ?  PT Long Term Goals - 09/14/21 0813   ? ?  ? PT LONG TERM GOAL #1  ? Title Pt will return to the gym   ? Baseline not yet, but improving and concerned about covid/   ? Time 4   ? Period Weeks   ? Status On-going   ? Target Date 10/12/21   ?  ? PT LONG TERM GOAL #2  ? Title Pt will improve 28min walk test to at least 1011ft to demonstrate decreased fall risk   ? Baseline 643, 1061 today   ? Time 8   ? Period Weeks   ? Status Achieved   ? Target Date 05/18/21   ?  ? PT LONG TERM GOAL #3  ? Title Pt will be able to perform a 5 x sts in 20 sec or less showing improved LE strength and   ? Period Weeks   ? Status Achieved   ? Target Date 05/18/21   ?  ? PT LONG TERM GOAL #4  ? Title Pt will be able to go up and down  stairs at home   ? Time 8   ? Period Weeks   ? Status Achieved   ? Target Date 12/14/20   ?  ? PT LONG TERM GOAL #5  ? Title Able to hold tandem stance and SLS for 10 seconds.   ? Baseline 35 sec tandem each side stopped by PT, Right SLS 10, left 22   ? Time 8   ? Period Weeks   ? Status Achieved   ? Target Date 07/12/21   ?  ? PT LONG TERM GOAL #6  ? Title Able to walk 1250 ft in 6 min to demonstrate improved gait speed   ? Baseline 1063 feet today 09/14/2021   ? Time 4   ? Period Weeks   ? Status On-going   ? Target Date 10/12/21   ?  ? PT LONG TERM GOAL #7  ? Title Pt will improve right shoulder ROM for flexion and abd to atleast revised to 95 degrees for improved ability to reach.   ? Status On-going   ? Target Date 10/12/21   ?  ? PT LONG TERM GOAL #8  ? Title Pt will improve bilateral grip strength to atleast 30 # for improved function   ? Baseline achieved for left, not right(27)   ? Time 4   ? Period Weeks   ? Status Partially Met   ? Target Date 10/12/21   ? ?  ?  ? ?  ? ? ? ? ? ? ? ? Plan - 10/01/21 1344   ? ? Clinical Impression Statement No lingering effects from fall beginning of week. Pt continues to tolerate aquatic exercise with essentially no rest breaks and increasing her work slightly.   ? PT Next Visit Plan continue aquatics 2x/week, land therapy 1x per week, pt to search out other community exercise opportunites, continue balance, strength, endurance activities   ? Consulted and Agree with Plan of Care Patient   ? ?  ?  ? ?  ? ? ?Patient will benefit from skilled therapeutic intervention in order to improve the following deficits and impairments:    ? ?Visit Diagnosis: ?Abnormal posture ? ?Multiple myeloma not having achieved remission ([place]) ? ?Muscle weakness (generalized) ? ?Fatigue, unspecified type ? ? ? ? ?Problem List ?Patient Active Problem List  ? Diagnosis Date Noted  ? Multiple myeloma ([place]) 07/13/2020  ? Claw toe, right   ?  Trigger finger, left middle finger 04/09/2020  ? Stenosing  tenosynovitis of finger of left hand 02/27/2020  ? 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 Major depression, recurrent ([place]) 01/12/2018  ? Chronic right shoulder pain 12/20/2017  ? MDD (major depressive disorder), recurrent episode, moderate ([place]) 11/04/2017  ? MGUS (monoclonal gammopathy of unknown significance) 10/24/2017  ? Primary osteoarthritis of both hands 09/26/2017  ? Primary osteoarthritis of right shoulder 09/26/2017  ? Primary osteoarthritis of both hips 09/26/2017  ? Status post total knee replacement, right 09/26/2017  ? Primary osteoarthritis of both feet 09/26/2017  ? History of asthma 09/26/2017  ? Primary osteoarthritis of left knee 04/18/2017  ? Menorrhagia 11/04/2016  ? Fibroid tumor 11/04/2016  ? Adenomyosis 11/04/2016  ? Hypertension, essential 11/04/2016  ? Total knee replacement status 01/28/2016  ? ? ?Cleta Heatley, PTA ?10/01/2021, 1:45 PM ? ?[facility] ?[facility] [place] ?[place], [place], [address] ?Phone: [phone]   Fax:  [phone] ? ?Name: Hannah [name] ?MRN: [id number] ?Date of Birth: 1961/02/02 ? ? ? ?

## 2021-10-04 ENCOUNTER — Ambulatory Visit: Payer: BC Managed Care – PPO

## 2021-10-04 DIAGNOSIS — M6281 Muscle weakness (generalized): Secondary | ICD-10-CM | POA: Diagnosis not present

## 2021-10-04 DIAGNOSIS — R293 Abnormal posture: Secondary | ICD-10-CM

## 2021-10-04 DIAGNOSIS — R262 Difficulty in walking, not elsewhere classified: Secondary | ICD-10-CM | POA: Diagnosis not present

## 2021-10-04 DIAGNOSIS — G8929 Other chronic pain: Secondary | ICD-10-CM | POA: Diagnosis not present

## 2021-10-04 DIAGNOSIS — C9000 Multiple myeloma not having achieved remission: Secondary | ICD-10-CM

## 2021-10-04 DIAGNOSIS — R5383 Other fatigue: Secondary | ICD-10-CM

## 2021-10-04 DIAGNOSIS — M25561 Pain in right knee: Secondary | ICD-10-CM | POA: Diagnosis not present

## 2021-10-04 NOTE — Therapy (Signed)
[facility] ?[facility] ?[place], [place], [address] ?Phone: [phone]   Fax:  [phone] ? ?Physical Therapy Treatment ? ?Patient Details  ?Name: Hannah [name] ?MRN: [id number] ?Date of Birth: 01-Apr-1961 ?Referring Provider (PT): Cira Rue ? ? ?Encounter Date: 10/04/2021 ? ? PT End of Session - 10/04/21 0100   ? ? Visit Number 70   ? Number of Visits 70   ? Date for PT Re-Evaluation 10/12/21   ? Authorization Type BCBS   ? PT Start Time [phone]   pt late  ? PT Stop Time 0855   ? PT Time Calculation (min) 41 min   ? Activity Tolerance Patient tolerated treatment well   ? Behavior During Therapy [facility] for tasks assessed/performed   ? ?  ?  ? ?  ? ? ?Past Medical History:  ?Diagnosis Date  ? Allergy   ? Anemia   ? Anxiety   ? Arthritis   ? Asthma   ? Depression   ? Hypertension   ? ? ?Past Surgical History:  ?Procedure Laterality Date  ? CHOLECYSTECTOMY  1993  ? COLONOSCOPY    ? TOTAL KNEE ARTHROPLASTY Right 01/28/2016  ? TOTAL KNEE ARTHROPLASTY Right 01/28/2016  ? Procedure: RIGHT TOTAL KNEE ARTHROPLASTY;  Surgeon: [name], MD;  Location: [facility];  Service: Orthopedics;  Laterality: Right;  ? TRIGGER FINGER RELEASE Right 06/18/2014  ? Procedure: RIGHT LONG FINGER TRIGGER RELEASE;  Surgeon: [name], MD;  Location: [facility];  Service: Orthopedics;  Laterality: Right;  ? TUBAL LIGATION    ? VAGINAL HYSTERECTOMY Bilateral 11/04/2016  ? Procedure: HYSTERECTOMY VAGINAL with Bilateral Salpingectomy;  Surgeon: [name], MD;  Location: [facility];  Service: Gynecology;  Laterality: Bilateral;  ? WEIL OSTEOTOMY Right 07/07/2020  ? Procedure: RIGHT FOOT WEIL OSTEOTOMY 2, 3, AND 4 METATARSALS AND PROXIMAL INTERPHALANGEAL JOINT FUSION 2 & 4 TOES;  Surgeon: Newt Minion, MD;  Location: [facility];  Service: Orthopedics;  Laterality: Right;  ? ? ?There were no vitals filed for this visit. ? ? Subjective Assessment - 10/04/21  0813   ? ? Subjective No present pain , no adverse effects from fall at home last week.  ? 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 She as diagnosed in February 2022 with Multiple Myeloma not in remission. 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 Limitations Walking   ? How long can you sit comfortably? no limits   ? How long can you stand comfortably? 29min   ? How long can you walk comfortably? 98min   ? Diagnostic tests Recent lumbar MRI; no NED   ? Patient Stated Goals get strong, build up the muscles   ? Currently in Pain? No/denies   ? Pain Score 0-No pain   ? ?  ?  ? ?  ? ? ? ? ? ? ? ? ? ? ? ? ? ? ? ? ? ? ? ? [facility] Adult PT Treatment/Exercise - 10/04/21 0001   ? ?  ? Exercises  ? Other Exercises  1000 ft walk non stop;untimed, mini squats x 15, heel raises x 20 no HH, Black balance pad, step and hold forward and sideways x 10 no HH,lateral band and monster walks with yellow band 2 lengths 10 steps each, yellow band scapular retraction and extension x 10,  bridges x 10,ppt x 10, Figure 4 stretch B x 20. core opp hand to knee 2 x 10,Supine clam 2 x 10 with yelow band, SLR with alternate arm 2 x 10 ea, bilateral HS stretch x 20 sec 2 xs each by PT, Lower trunk rotation x 3 ea , wall slides for shoulder flexion x 5  ? ?  ?  ? ?  ? ? ? ? ? ? ? ? ? ? ? ? ? ? ? PT Long Term Goals - 09/14/21 0813   ? ?  ? PT LONG TERM GOAL #1  ? Title Pt will return to the gym   ? Baseline not yet, but improving and concerned about covid/   ? Time 4   ? Period Weeks   ? Status On-going   ? Target Date 10/12/21   ?  ? PT LONG TERM GOAL #2  ? Title Pt will improve 21mn walk test to at least 10090fto demonstrate decreased fall risk   ? Baseline 643, 1061 today   ? Time 8   ? Period Weeks   ? Status Achieved   ? Target Date 05/18/21   ?  ? PT LONG TERM GOAL #3  ? Title Pt will be able to perform a 5 x sts in 20 sec or less  showing improved LE strength and   ? Period Weeks   ? Status Achieved   ? Target Date 05/18/21   ?  ? PT LONG TERM GOAL #4  ? Title Pt will be able to go up and down stairs at home   ? Time 8   ? Period Weeks   ? Status Achieved   ? Target Date 12/14/20   ?  ? PT LONG TERM GOAL #5  ? Title Able to hold tandem stance and SLS for 10 seconds.   ? Baseline 35 sec tandem each side stopped by PT, Right SLS 10, left 22   ? Time 8   ? Period Weeks   ? Status Achieved   ? Target Date 07/12/21   ?  ? PT LONG TERM GOAL #6  ? Title Able to walk 1250 ft in 6 min to demonstrate improved gait speed   ? Baseline 1063 feet today 09/14/2021   ? Time 4   ? Period Weeks   ? Status On-going   ? Target Date 10/12/21   ?  ? PT LONG TERM GOAL #7  ? Title Pt will improve right shoulder ROM for flexion and abd to atleast revised to 95 degrees for improved ability to reach.   ? Status On-going   ? Target Date 10/12/21   ?  ? PT LONG TERM GOAL #8  ? Title Pt will improve bilateral grip strength to atleast 30 # for improved function   ? Baseline achieved for left, not right(27)   ? Time 4   ? Period Weeks   ? Status Partially Met   ? Target Date 10/12/21   ? ?  ?  ? ?  ? ? ? ? ? ? ? ? Plan - 10/04/21 0835   ? ? Clinical Impression Statement pt did very well with exercises and had no increased complaints of pain except in right shoulder with wall slides. She had 1 short water break but is demonstrating improved endurance with no rest needed after walking continuously for 1000 feet. Plan to discharge next Monday. Pt is planning to tour [facility] between now and then to have  something set up for her to continue water therapy and land program by time of discharge.   ? Personal Factors and Comorbidities Comorbidity 3+   ? Comorbidities Multiple Myeloma, multi joint OA, recent foot surgery, fall risk,SCT   ? Examination-Activity Limitations Sleep;Squat;Stand;Stairs;Transfers;Locomotion Level;Sit   ? Examination-Participation Restrictions  Cleaning;Community Activity;Occupation;Shop   ? Stability/Clinical Decision Making Stable/Uncomplicated   ? Rehab Potential Good   ? PT Frequency 3x / week   ? PT Duration 4 weeks   ? PT Treatment/Interventions ADLs/Self Care Home Management;Aquatic Therapy;Gait training;Stair training;Functional mobility training;Therapeutic activities;Therapeutic exercise;Balance training;Neuromuscular re-education;Manual techniques;Patient/family education;Passive range of motion;Joint Manipulations   ? PT Next Visit Plan continue aquatics 2x/week, land therapy 1x per week, pt to search out other community exercise opportunites, continue balance, strength, endurance activities   ? Consulted and Agree with Plan of Care Patient   ? ?  ?  ? ?  ? ? ?Patient will benefit from skilled therapeutic intervention in order to improve the following deficits and impairments:  Abnormal gait, Decreased mobility, Decreased activity tolerance, Decreased endurance, Decreased strength, Decreased balance, Decreased knowledge of precautions, Difficulty walking, Postural dysfunction, Pain ? ?Visit Diagnosis: ?Abnormal posture ? ?Multiple myeloma not having achieved remission ([place]) ? ?Muscle weakness (generalized) ? ?Fatigue, unspecified type ? ?Difficulty in walking, not elsewhere classified ? ?Chronic pain of right knee ? ? ? ? ?Problem List ?Patient Active Problem List  ? Diagnosis Date Noted  ? Multiple myeloma ([place]) 07/13/2020  ? Claw toe, right   ? Trigger finger, left middle finger 04/09/2020  ? Stenosing tenosynovitis of finger of left hand 02/27/2020  ? 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 Major depression, recurrent ([place]) 01/12/2018  ? Chronic right shoulder pain 12/20/2017  ? MDD (major depressive disorder), recurrent episode, moderate ([place]) 11/04/2017  ? MGUS (monoclonal gammopathy of unknown significance) 10/24/2017  ? Primary osteoarthritis of both hands 09/26/2017  ? Primary osteoarthritis of right shoulder 09/26/2017  ? Primary  osteoarthritis of both hips 09/26/2017  ? Status post total knee replacement, right 09/26/2017  ? Primary osteoarthritis of both feet 09/26/2017  ? History of asthma 09/26/2017  ? Primary osteoarthritis of left knee 11/20/201

## 2021-10-06 ENCOUNTER — Encounter: Payer: Self-pay | Admitting: Physical Therapy

## 2021-10-06 ENCOUNTER — Ambulatory Visit: Payer: BC Managed Care – PPO | Admitting: Physical Therapy

## 2021-10-06 DIAGNOSIS — M6281 Muscle weakness (generalized): Secondary | ICD-10-CM | POA: Diagnosis not present

## 2021-10-06 DIAGNOSIS — R293 Abnormal posture: Secondary | ICD-10-CM | POA: Diagnosis not present

## 2021-10-06 DIAGNOSIS — R262 Difficulty in walking, not elsewhere classified: Secondary | ICD-10-CM | POA: Diagnosis not present

## 2021-10-06 DIAGNOSIS — R5383 Other fatigue: Secondary | ICD-10-CM

## 2021-10-06 DIAGNOSIS — C9000 Multiple myeloma not having achieved remission: Secondary | ICD-10-CM

## 2021-10-06 DIAGNOSIS — G8929 Other chronic pain: Secondary | ICD-10-CM | POA: Diagnosis not present

## 2021-10-06 DIAGNOSIS — M25561 Pain in right knee: Secondary | ICD-10-CM | POA: Diagnosis not present

## 2021-10-06 NOTE — Therapy (Signed)
[facility] ?[place], [place], [address] ?Phone: [phone]   Fax:  [phone] ? ?Physical Therapy Treatment ? ?Patient Details  ?Name: Hannah [name] ?MRN: [id number] ?Date of Birth: 03-29-1961 ?Referring Provider (PT): Cira Rue ? ? ?Encounter Date: 10/06/2021 ? ? PT End of Session - 10/06/21 0807   ? ? Visit Number 56   ? Number of Visits 70   ? Date for PT Re-Evaluation 10/12/21   ? Authorization Type BCBS   ? PT Start Time [phone]   ? PT Stop Time 0850   ? PT Time Calculation (min) 44 min   ? Activity Tolerance Patient tolerated treatment well   ? Behavior During Therapy [facility] for tasks assessed/performed   ? ?  ?  ? ?  ? ? ?Past Medical History:  ?Diagnosis Date  ? Allergy   ? Anemia   ? Anxiety   ? Arthritis   ? Asthma   ? Depression   ? Hypertension   ? ? ?Past Surgical History:  ?Procedure Laterality Date  ? CHOLECYSTECTOMY  1993  ? COLONOSCOPY    ? TOTAL KNEE ARTHROPLASTY Right 01/28/2016  ? TOTAL KNEE ARTHROPLASTY Right 01/28/2016  ? Procedure: RIGHT TOTAL KNEE ARTHROPLASTY;  Surgeon: [name], MD;  Location: [facility];  Service: Orthopedics;  Laterality: Right;  ? TRIGGER FINGER RELEASE Right 06/18/2014  ? Procedure: RIGHT LONG FINGER TRIGGER RELEASE;  Surgeon: [name], MD;  Location: [facility];  Service: Orthopedics;  Laterality: Right;  ? TUBAL LIGATION    ? VAGINAL HYSTERECTOMY Bilateral 11/04/2016  ? Procedure: HYSTERECTOMY VAGINAL with Bilateral Salpingectomy;  Surgeon: [name], MD;  Location: [facility];  Service: Gynecology;  Laterality: Bilateral;  ? WEIL OSTEOTOMY Right 07/07/2020  ? Procedure: RIGHT FOOT WEIL OSTEOTOMY 2, 3, AND 4 METATARSALS AND PROXIMAL INTERPHALANGEAL JOINT FUSION 2 & 4 TOES;  Surgeon: Newt Minion, MD;  Location: [facility];  Service: Orthopedics;  Laterality: Right;  ? ? ?There were no vitals filed for this visit. ? ? Subjective Assessment - 10/06/21 0942   ? ?  Subjective Doing well no current pain   ? Currently in Pain? No/denies   ? ?  ?  ? ?  ? ? ?Treatment: Pt arrives for aquatic physical therapy. Treatment took place in 3.5-5.5 feet of water. Water temperature was 92 degrees F. Pt entered the pool via stairs with mild use of the rails. Pt requires buoyancy of water for support and to offload joints with strengthening exercises.  Pt utilizes viscosity of the water required for strengthening.  ? ?Standing in 50%- 75% depth water pt performed water walking in all 4 directions 10x. VC for speed in order to generate appropriate current for resistance and/or UE movements. Hand paddles added for side stepping.   ?Wall exercises to inc: Hip 3 ways 20x ea with min support of pool wall. VC to push/pull against water with a force appropriate. Core contractions in partial wall squat with blue noodle 5 sec hold 10x. Bil heel raises 20x no UE support. High knee marching across pool holding wts under water 8x. Hand paddles for shoulder ext/flex 2x10, horizontal add/abd 2x 10, VC to maintain core throughout exercises. Underwater bicycle with large noodle behind pt for min 8 min. Blue noodle knee extensions Bil 15x.Lumbar decompression 4 min. ? ? ? ? ? ? ? ? ? ? ? ? ? ? ? ? ? ? ? ? ? ? ? ? ? ? ? ? ? ? ?  PT Long Term Goals - 09/14/21 0813   ? ?  ? PT LONG TERM GOAL #1  ? Title Pt will return to the gym   ? Baseline not yet, but improving and concerned about covid/   ? Time 4   ? Period Weeks   ? Status On-going   ? Target Date 10/12/21   ?  ? PT LONG TERM GOAL #2  ? Title Pt will improve 42mn walk test to at least 10051fto demonstrate decreased fall risk   ? Baseline 643, 1061 today   ? Time 8   ? Period Weeks   ? Status Achieved   ? Target Date 05/18/21   ?  ? PT LONG TERM GOAL #3  ? Title Pt will be able to perform a 5 x sts in 20 sec or less showing improved LE strength and   ? Period Weeks   ? Status Achieved   ? Target Date 05/18/21   ?  ? PT LONG TERM GOAL #4  ? Title Pt will be  able to go up and down stairs at home   ? Time 8   ? Period Weeks   ? Status Achieved   ? Target Date 12/14/20   ?  ? PT LONG TERM GOAL #5  ? Title Able to hold tandem stance and SLS for 10 seconds.   ? Baseline 35 sec tandem each side stopped by PT, Right SLS 10, left 22   ? Time 8   ? Period Weeks   ? Status Achieved   ? Target Date 07/12/21   ?  ? PT LONG TERM GOAL #6  ? Title Able to walk 1250 ft in 6 min to demonstrate improved gait speed   ? Baseline 1063 feet today 09/14/2021   ? Time 4   ? Period Weeks   ? Status On-going   ? Target Date 10/12/21   ?  ? PT LONG TERM GOAL #7  ? Title Pt will improve right shoulder ROM for flexion and abd to atleast revised to 95 degrees for improved ability to reach.   ? Status On-going   ? Target Date 10/12/21   ?  ? PT LONG TERM GOAL #8  ? Title Pt will improve bilateral grip strength to atleast 30 # for improved function   ? Baseline achieved for left, not right(27)   ? Time 4   ? Period Weeks   ? Status Partially Met   ? Target Date 10/12/21   ? ?  ?  ? ?  ? ? ? ? ? ? ? ? Plan - 10/06/21 0943   ? ? Clinical Impression Statement Pt is thinking she may have to choose walking vs aquatic exercise due to finances BU she also recognizes the aquatics is kinder to her bones and joints so a final decision has not been made yet.   ? PT Next Visit Plan DC next week per POC   ? ?  ?  ? ?  ? ? ?Patient will benefit from skilled therapeutic intervention in order to improve the following deficits and impairments:    ? ?Visit Diagnosis: ?Abnormal posture ? ?Multiple myeloma not having achieved remission ([place]? ?Fatigue, unspecified type ? ? ? ? ?Problem List ?Patient Active Problem List  ? Diagnosis Date Noted  ? Multiple myeloma ([facility]/14/2022  ? Claw toe, right   ? Trigger finger, left middle finger 04/09/2020  ? Stenosing tenosynovitis of finger of left hand  02/27/2020  ? 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 Major depression, recurrent ([place]) 01/12/2018  ? Chronic right shoulder  pain 12/20/2017  ? MDD (major depressive disorder), recurrent episode, moderate ([place]) 11/04/2017  ? MGUS (monoclonal gammopathy of unknown significance) 10/24/2017  ? Primary osteoarthritis of both hands 09/26/2017  ? Primary osteoarthritis of right shoulder 09/26/2017  ? Primary osteoarthritis of both hips 09/26/2017  ? Status post total knee replacement, right 09/26/2017  ? Primary osteoarthritis of both feet 09/26/2017  ? History of asthma 09/26/2017  ? Primary osteoarthritis of left knee 04/18/2017  ? Menorrhagia 11/04/2016  ? Fibroid tumor 11/04/2016  ? Adenomyosis 11/04/2016  ? Hypertension, essential 11/04/2016  ? Total knee replacement status 01/28/2016  ? ? ?Chauna Osoria, PTA ?10/06/2021, 9:47 AM ? ?[facility] [place] ?[place], [place], [address] ?Phone: [phone]   Fax:  [phone] ? ?Name: Hannah [name] ?MRN: [id number] ?Date of Birth: 1960-10-22 ? ? ? ?

## 2021-10-08 ENCOUNTER — Ambulatory Visit: Payer: BC Managed Care – PPO | Admitting: Physical Therapy

## 2021-10-08 ENCOUNTER — Encounter: Payer: Self-pay | Admitting: Physical Therapy

## 2021-10-08 DIAGNOSIS — M25561 Pain in right knee: Secondary | ICD-10-CM | POA: Diagnosis not present

## 2021-10-08 DIAGNOSIS — R262 Difficulty in walking, not elsewhere classified: Secondary | ICD-10-CM

## 2021-10-08 DIAGNOSIS — R5383 Other fatigue: Secondary | ICD-10-CM

## 2021-10-08 DIAGNOSIS — G8929 Other chronic pain: Secondary | ICD-10-CM | POA: Diagnosis not present

## 2021-10-08 DIAGNOSIS — M6281 Muscle weakness (generalized): Secondary | ICD-10-CM

## 2021-10-08 DIAGNOSIS — C9000 Multiple myeloma not having achieved remission: Secondary | ICD-10-CM

## 2021-10-08 DIAGNOSIS — R293 Abnormal posture: Secondary | ICD-10-CM | POA: Diagnosis not present

## 2021-10-08 NOTE — Therapy (Addendum)
[facility] [place], [place], [address] Phone: [phone]   Fax:  [phone]  Physical Therapy Treatment  Patient Details  Name: Hannah [name] MRN: [id number] Date of Birth: 06-29-1960 Referring Provider (PT): Cira Rue   Encounter Date: 10/08/2021   PT End of Session - 10/08/21 1216     Visit Number 49    Number of Visits 70    Date for PT Re-Evaluation 10/12/21    Authorization Type BCBS    PT Start Time 1215    PT Stop Time 1308    PT Time Calculation (min) 53 min    Activity Tolerance Patient tolerated treatment well    Behavior During Therapy [facility] for tasks assessed/performed              Past Medical History:  Diagnosis Date   Allergy    Anemia    Anxiety    Arthritis    Asthma    Depression    Hypertension     Past Surgical History:  Procedure Laterality Date   CHOLECYSTECTOMY  1993   COLONOSCOPY     TOTAL KNEE ARTHROPLASTY Right 01/28/2016   TOTAL KNEE ARTHROPLASTY Right 01/28/2016   Procedure: RIGHT TOTAL KNEE ARTHROPLASTY;  Surgeon: [name], MD;  Location: [place] [facility];  Service: Orthopedics;  Laterality: Right;   TRIGGER FINGER RELEASE Right 06/18/2014   Procedure: RIGHT LONG FINGER TRIGGER RELEASE;  Surgeon: [name], MD;  Location: [facility];  Service: Orthopedics;  Laterality: Right;   TUBAL LIGATION     VAGINAL HYSTERECTOMY Bilateral 11/04/2016   Procedure: HYSTERECTOMY VAGINAL with Bilateral Salpingectomy;  Surgeon: [name], MD;  Location: [facility];  Service: Gynecology;  Laterality: Bilateral;   WEIL OSTEOTOMY Right 07/07/2020   Procedure: RIGHT FOOT WEIL OSTEOTOMY 2, 3, AND 4 METATARSALS AND PROXIMAL INTERPHALANGEAL JOINT FUSION 2 & 4 TOES;  Surgeon: Newt Minion, MD;  Location: [facility];  Service: Orthopedics;  Laterality: Right;    There were no vitals filed for this visit. 10/08/21:Treatment: Pt arrives for aquatic  physical therapy. Treatment took place in 3.5-5.5 feet of water. Water temperature was 92 degrees F. Pt entered the pool via stairs with mild use of the rails. Pt requires buoyancy of water for support and to offload joints with strengthening exercises.  Pt utilizes viscosity of the water required for strengthening.  Subjective Assessment - 10/08/21 1308     Subjective Very sad this is my last day. The water therapy was very helpful.    Currently in Pain? No/denies            Standing in 50%- 75% depth water pt performed water walking in all 4 directions 10x. VC for speed in order to generate appropriate current for resistance and/or UE movements. Hand paddles added for side stepping.   Wall exercises to inc: Hip 3 ways 20x ea with min support of pool wall. VC to push/pull against water with a force appropriate. Core contractions in partial wall squat with blue noodle 5 sec hold 10x. Bil heel raises 20x no UE support. High knee marching across pool holding wts under water 8x. Hand paddles for shoulder ext/flex 2x10, horizontal add/abd 2x 10, VC to maintain core throughout exercises. Underwater bicycle with large noodle behind pt for min 8 min. Blue noodle knee extensions Bil 15x.Lumbar decompression 4 min.  Treatment: Pt arrives for aquatic physical therapy. Treatment took place in 3.5-5.5 feet  of water. Water temperature was 92 degrees F. Pt entered the pool via stairs with mild use of the rails. Pt requires buoyancy of water for support and to offload joints with strengthening exercises.  Pt utilizes viscosity of the water required for strengthening.   Standing in 50%- 75% depth water pt performed water walking in all 4 directions 10x. VC for speed in order to generate appropriate current for resistance and/or UE movements. Hand paddles added for side stepping.   Wall exercises to inc: Hip 3 ways 20x ea with min support of pool wall. VC to push/pull against water with a force appropriate. Core  contractions in partial wall squat with blue noodle 5 sec hold 10x. Bil heel raises 20x no UE support. High knee marching across pool holding wts under water 8x. Hand paddles for shoulder ext/flex 2x10, horizontal add/abd 2x 10, VC to maintain core throughout exercises. Underwater bicycle with large noodle behind pt for min 8 min. Blue noodle knee extensions Bil 15x.Lumbar decompression 4 min.                                PT Long Term Goals - 09/14/21 0813       PT LONG TERM GOAL #1   Title Pt will return to the gym    Baseline not yet, but improving and concerned about covid/    Time 4    Period Weeks    Status On-going    Target Date 10/12/21      PT LONG TERM GOAL #2   Title Pt will improve 30mn walk test to at least 10023fto demonstrate decreased fall risk    Baseline 643, 1061 today    Time 8    Period Weeks    Status Achieved    Target Date 05/18/21      PT LONG TERM GOAL #3   Title Pt will be able to perform a 5 x sts in 20 sec or less showing improved LE strength and    Period Weeks    Status Achieved    Target Date 05/18/21      PT LONG TERM GOAL #4   Title Pt will be able to go up and down stairs at home    Time 8    Period Weeks    Status Achieved    Target Date 12/14/20      PT LONG TERM GOAL #5   Title Able to hold tandem stance and SLS for 10 seconds.    Baseline 35 sec tandem each side stopped by PT, Right SLS 10, left 22    Time 8    Period Weeks    Status Achieved    Target Date 07/12/21      PT LONG TERM GOAL #6   Title Able to walk 1250 ft in 6 min to demonstrate improved gait speed    Baseline 1063 feet today 09/14/2021    Time 4    Period Weeks    Status On-going    Target Date 10/12/21      PT LONG TERM GOAL #7   Title Pt will improve right shoulder ROM for flexion and abd to atleast revised to 95 degrees for improved ability to reach.    Status On-going    Target Date 10/12/21      PT LONG TERM GOAL #8    Title Pt will improve bilateral grip strength to atleast 30 #  for improved function    Baseline achieved for left, not right(27)    Time 4    Period Weeks    Status Partially Met    Target Date 10/12/21                   Plan - 10/08/21 1309     Clinical Impression Statement Pt completed aquatic PT today and will make decision on future HEP.    PT Next Visit Plan Probable DC on MOnday              Patient will benefit from skilled therapeutic intervention in order to improve the following deficits and impairments:     Visit Diagnosis: Abnormal posture  Multiple myeloma not having achieved remission ([facility])  Fatigue, unspecified type  Muscle weakness (generalized)  Difficulty in walking, not elsewhere classified     Problem List Patient Active Problem List   Diagnosis Date Noted   Multiple myeloma ([place]) 07/13/2020   Claw toe, right    Trigger finger, left middle finger 04/09/2020   Stenosing tenosynovitis of finger of left hand 02/27/2020   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   Major depression, recurrent ([place]) 01/12/2018   Chronic right shoulder pain 12/20/2017   MDD (major depressive disorder), recurrent episode, moderate ([place]) 11/04/2017   MGUS (monoclonal gammopathy of unknown significance) 10/24/2017   Primary osteoarthritis of both hands 09/26/2017   Primary osteoarthritis of right shoulder 09/26/2017   Primary osteoarthritis of both hips 09/26/2017   Status post total knee replacement, right 09/26/2017   Primary osteoarthritis of both feet 09/26/2017   History of asthma 09/26/2017   Primary osteoarthritis of left knee 04/18/2017   Menorrhagia 11/04/2016   Fibroid tumor 11/04/2016   Adenomyosis 11/04/2016   Hypertension, essential 11/04/2016   Total knee replacement status 01/28/2016  PHYSICAL THERAPY DISCHARGE SUMMARY  Visits from Start of Care: 66  Current functional level related to goals / functional outcomes: Pt achieved some goals,  partially met some goals and some goals were not assessed at discharge secondary to pt not returning for her last visit.   Remaining deficits: Pt fatigues with short periods of walking,standing or other endurance type activities but is overall greatly improved from initial visit. She continues with right shoulder restrictions due to significant arthritis   Education / Equipment: HEP, pt is also going to look into joining a gym/pool  Patient agrees to discharge. Patient goals were partially met. Patient is being discharged due to not returning since the last visit.  Cheral Almas, PT 10/18/21 9:17 AM  Taariq Leitz, PTA 10/08/2021, 1:11 PM  [facility] [place], [place], [address] Phone: [phone]   Fax:  [phone]  Name: GIANNAMARIE PAULUS MRN: [id number] Date of Birth: 12/28/1960

## 2021-10-12 DIAGNOSIS — C9000 Multiple myeloma not having achieved remission: Secondary | ICD-10-CM | POA: Diagnosis not present

## 2021-10-12 DIAGNOSIS — Z9484 Stem cells transplant status: Secondary | ICD-10-CM | POA: Diagnosis not present

## 2021-10-12 DIAGNOSIS — Z23 Encounter for immunization: Secondary | ICD-10-CM | POA: Diagnosis not present

## 2021-10-13 ENCOUNTER — Other Ambulatory Visit: Payer: Self-pay | Admitting: Hematology

## 2021-10-13 ENCOUNTER — Ambulatory Visit: Payer: BC Managed Care – PPO | Admitting: Physical Therapy

## 2021-10-13 DIAGNOSIS — C9000 Multiple myeloma not having achieved remission: Secondary | ICD-10-CM

## 2021-10-20 ENCOUNTER — Ambulatory Visit: Payer: BC Managed Care – PPO | Admitting: Physical Therapy

## 2021-10-22 ENCOUNTER — Ambulatory Visit: Payer: BC Managed Care – PPO | Admitting: Physical Therapy

## 2021-10-27 ENCOUNTER — Ambulatory Visit: Payer: BC Managed Care – PPO | Admitting: Physical Therapy

## 2021-10-29 ENCOUNTER — Ambulatory Visit: Payer: BC Managed Care – PPO | Admitting: Physical Therapy

## 2021-10-29 ENCOUNTER — Other Ambulatory Visit: Payer: Self-pay

## 2021-11-02 DIAGNOSIS — J4540 Moderate persistent asthma, uncomplicated: Secondary | ICD-10-CM | POA: Diagnosis not present

## 2021-11-02 DIAGNOSIS — R0981 Nasal congestion: Secondary | ICD-10-CM | POA: Diagnosis not present

## 2021-11-02 DIAGNOSIS — R051 Acute cough: Secondary | ICD-10-CM | POA: Diagnosis not present

## 2021-11-08 ENCOUNTER — Other Ambulatory Visit: Payer: Self-pay | Admitting: Hematology

## 2021-11-08 DIAGNOSIS — C9000 Multiple myeloma not having achieved remission: Secondary | ICD-10-CM

## 2021-11-08 MED ORDER — REVLIMID 10 MG PO CAPS: 10.0000 mg | ORAL_CAPSULE | Freq: Every day | ORAL | 0 refills | Status: DC

## 2021-11-09 ENCOUNTER — Other Ambulatory Visit: Payer: Self-pay

## 2021-11-09 MED ORDER — LENALIDOMIDE 10 MG PO CAPS: ORAL_CAPSULE | ORAL | 0 refills | Status: DC

## 2021-11-23 ENCOUNTER — Other Ambulatory Visit: Payer: Self-pay | Admitting: Hematology

## 2021-11-23 DIAGNOSIS — C9000 Multiple myeloma not having achieved remission: Secondary | ICD-10-CM

## 2021-11-24 ENCOUNTER — Inpatient Hospital Stay: Payer: BC Managed Care – PPO

## 2021-11-24 ENCOUNTER — Inpatient Hospital Stay: Payer: BC Managed Care – PPO | Attending: Hematology | Admitting: Hematology

## 2021-11-24 ENCOUNTER — Encounter: Payer: Self-pay | Admitting: Hematology

## 2021-11-24 ENCOUNTER — Other Ambulatory Visit: Payer: Self-pay

## 2021-11-24 VITALS — BP 134/88 | HR 107 | Temp 98.1°F | Resp 18 | Ht 66.0 in | Wt 212.9 lb

## 2021-11-24 DIAGNOSIS — Z9484 Stem cells transplant status: Secondary | ICD-10-CM | POA: Diagnosis not present

## 2021-11-24 DIAGNOSIS — Z882 Allergy status to sulfonamides status: Secondary | ICD-10-CM | POA: Insufficient documentation

## 2021-11-24 DIAGNOSIS — C9000 Multiple myeloma not having achieved remission: Secondary | ICD-10-CM | POA: Insufficient documentation

## 2021-11-24 DIAGNOSIS — Z7961 Long term (current) use of immunomodulator: Secondary | ICD-10-CM | POA: Diagnosis not present

## 2021-11-24 DIAGNOSIS — Z9079 Acquired absence of other genital organ(s): Secondary | ICD-10-CM | POA: Insufficient documentation

## 2021-11-24 DIAGNOSIS — I10 Essential (primary) hypertension: Secondary | ICD-10-CM | POA: Diagnosis not present

## 2021-11-24 DIAGNOSIS — C9001 Multiple myeloma in remission: Secondary | ICD-10-CM

## 2021-11-24 DIAGNOSIS — M1990 Unspecified osteoarthritis, unspecified site: Secondary | ICD-10-CM | POA: Diagnosis not present

## 2021-11-24 DIAGNOSIS — F419 Anxiety disorder, unspecified: Secondary | ICD-10-CM | POA: Insufficient documentation

## 2021-11-24 DIAGNOSIS — F32A Depression, unspecified: Secondary | ICD-10-CM | POA: Insufficient documentation

## 2021-11-24 DIAGNOSIS — D509 Iron deficiency anemia, unspecified: Secondary | ICD-10-CM | POA: Diagnosis not present

## 2021-11-24 DIAGNOSIS — Z79899 Other long term (current) drug therapy: Secondary | ICD-10-CM | POA: Diagnosis not present

## 2021-11-24 DIAGNOSIS — E669 Obesity, unspecified: Secondary | ICD-10-CM | POA: Insufficient documentation

## 2021-11-24 DIAGNOSIS — Z9049 Acquired absence of other specified parts of digestive tract: Secondary | ICD-10-CM | POA: Insufficient documentation

## 2021-11-24 LAB — CMP (CANCER CENTER ONLY)
ALT: 9 U/L (ref 0–44)
AST: 11 U/L — ABNORMAL LOW (ref 15–41)
Albumin: 3.7 g/dL (ref 3.5–5.0)
Alkaline Phosphatase: 73 U/L (ref 38–126)
Anion gap: 5 (ref 5–15)
BUN: 18 mg/dL (ref 6–20)
CO2: 29 mmol/L (ref 22–32)
Calcium: 8.8 mg/dL — ABNORMAL LOW (ref 8.9–10.3)
Chloride: 106 mmol/L (ref 98–111)
Creatinine: 0.87 mg/dL (ref 0.44–1.00)
GFR, Estimated: >60 mL/min (ref >60)
Glucose, Bld: 100 mg/dL — ABNORMAL HIGH (ref 70–99)
Potassium: 3.8 mmol/L (ref 3.5–5.1)
Sodium: 140 mmol/L (ref 135–145)
Total Bilirubin: 0.4 mg/dL (ref 0.3–1.2)
Total Protein: 6.8 g/dL (ref 6.5–8.1)

## 2021-11-24 LAB — CBC WITH DIFFERENTIAL (CANCER CENTER ONLY)
Abs Immature Granulocytes: 0.03 10*3/uL (ref 0.00–0.07)
Basophils Absolute: 0 10*3/uL (ref 0.0–0.1)
Basophils Relative: 1 %
Eosinophils Absolute: 0.4 10*3/uL (ref 0.0–0.5)
Eosinophils Relative: 7 %
HCT: 32.7 % — ABNORMAL LOW (ref 36.0–46.0)
Hemoglobin: 10.7 g/dL — ABNORMAL LOW (ref 12.0–15.0)
Immature Granulocytes: 1 %
Lymphocytes Relative: 25 %
Lymphs Abs: 1.4 10*3/uL (ref 0.7–4.0)
MCH: 23.6 pg — ABNORMAL LOW (ref 26.0–34.0)
MCHC: 32.7 g/dL (ref 30.0–36.0)
MCV: 72 fL — ABNORMAL LOW (ref 80.0–100.0)
Monocytes Absolute: 0.4 10*3/uL (ref 0.1–1.0)
Monocytes Relative: 7 %
Neutro Abs: 3.2 10*3/uL (ref 1.7–7.7)
Neutrophils Relative %: 59 %
Platelet Count: 180 10*3/uL (ref 150–400)
RBC: 4.54 MIL/uL (ref 3.87–5.11)
RDW: 19.2 % — ABNORMAL HIGH (ref 11.5–15.5)
WBC Count: 5.4 10*3/uL (ref 4.0–10.5)
nRBC: 0 % (ref 0.0–0.2)

## 2021-11-24 NOTE — Progress Notes (Signed)
[place]   Telephone:(336) [phone] Fax:(336) [phone]   Clinic Follow up Note   Patient Care Team: Tisovec, Fransico Him, MD as PCP - General (Internal Medicine) Bo Merino, MD as Consulting Physician (Rheumatology) Truitt Merle, MD as Consulting Physician (Hematology) Waymon Amato, MD as Consulting Physician (Obstetrics and Gynecology)  Date of Service:  11/24/2021  CHIEF COMPLAINT: f/u of IgA kappa multiple myeloma  CURRENT THERAPY:  -Maintenance Revlimid 10 mg, days 1-21 q. 28 days started 05/03/21 -Zometa q81month, restarted 05/28/21  ASSESSMENT & PLAN:  Hannah GREENHOUSEis a 61y.o. female with   1. 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   -staging bone marrow biopsy on 07/30/20 showed slightly hypercellular marrow for age and 31% plasma cells. Cytogenetics and FISH were unremarkable -she received VRD (weekly Velcade and dexamethasone, Revlimid 2 weeks on 1 week off) on 08/17/20 - June 2022. She responded well. -She [facility] high-dose [facility] with melphalan [facility] L[facility]is Lot  White cells engrafted 01/03/21 and platelets 01/13/21, last platelet transfusion 01/02/21 -posttreatment PET 04/06/21 showed NED  -posttreatment bone marrow biopsy 04/29/21 showed 10-20% hypocellular marrow with myeloid hypoplasia and 3% plasma cells -Continue prophylaxis with valacyclovir for 1 year and dapsone  for 6 months posttransplant; [facility] -she began maintenance Revlimid 10 mg daily days 1-21 q. 28 days on 05/03/21. She is tolerating well with no noticeable side effects.  [facility] -labs reviewed, overall stable, will continue Revlimid. MM panels were performed today, will call her with results. -[facility]   2. history of low back pain, right hip pain -PET 07/24/20 showed large hypermetabolic lesions in the left and right pelvis and a faintly metabolic lesion in the left scapula -s/p palliative radiation to the pelvis, left scapula, T4-6, and T10-12 3/3-3/16/22. Pain resolved now. -She received 4 doses of monthly Zometa 3/7-11/02/20, resumed 05/28/21, will continue every 3 months for a total of 2 years  -repeat lumbar spine MRI on 09/02/21 showed interval treatment response with regression of large pelvis deposits and resolution of widespread lumbar involvement. -her pain has resolved    3. 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 -Hemoglobin electrophoresis showed increase in Hg C, consistent with Hg C trait.  She is aware her children are at risk of hemoglobin C trait -Continue supplements per transplant team; she is no longer on oral iron. -hgb stable, 10.7 today (11/24/21)   4. HTN, OA, Obesity, Cancer Screenings -On chlorthalidone for HTN -She will continue to f/u with PCP, Chiropractor and Ortho.  -she is up-to-date on appropriate screening. Last mammogram 03/2021, colonoscopy due 10/2023.   5. Anxiety, depression -[place], tolerating Zoloft -She has strong faith and good family/social support.  She feels very positive about her experience and wants to share with others  Plan: -continue Revlimid 10 mg p.o. daily 3 weeks on/1 week off -schedule Zometa in next few weeks -lab, f/u, and Zometa in  3 months  -she prefers early morning appointments   No problem-specific Assessment & Plan notes found for this encounter.   SUMMARY OF ONCOLOGIC HISTORY: Oncology History Overview Note  Cancer Staging Multiple myeloma ([place]) Staging form: Plasma Cell Myeloma and Plasma Cell Disorders, AJCC 8th Edition - Clinical stage from 07/20/2020: Beta-2-microglobulin (mg/L): 2.4, Albumin (g/dL): 3.2, ISS: Stage II, High-risk cytogenetics: Unknown, LDH: Normal - Signed by Truitt Merle, MD on 08/02/2020 Beta 2 microglobulin range (mg/L): Less than 3.5 Albumin range (g/dL): Less than 3.5 Cytogenetics: Unknown    Multiple myeloma ([place])  07/03/2020 Imaging   MRI Lumbar Spine  IMPRESSION: 1. Numerous T1 hypointense and STIR hyperintense lesions throughout the visualized spine and sacrum with multiple areas of extraosseous extension in the sacrum, detailed above and compatible with multiple myeloma given the clinical history. 2. An MRI of the pelvis with contrast could evaluate the full extent of the partially imaged sacral lesions, including left S1-S2 neural foraminal involvement and suspected involvement of the exited left L5 nerve. 3. Multilevel degenerative change without significant canal or foraminal stenosis in the lumbar spine.   07/13/2020 Initial Diagnosis   Multiple myeloma ([place])   07/20/2020 Cancer Staging   Staging form: Plasma Cell Myeloma and Plasma Cell Disorders, AJCC 8th Edition - Clinical stage from 07/20/2020: Beta-2-microglobulin (mg/L): 2.4, Albumin (g/dL): 3.2, ISS: Stage II, High-risk cytogenetics: Unknown, LDH: Normal - Signed by Truitt Merle, MD on 08/31/2020 Beta 2 microglobulin range (mg/L): Less than 3.5 Albumin range (g/dL): Less than 3.5 Cytogenetics: No abnormalities Bone disease on imaging: Present   07/24/2020 PET scan   IMPRESSION: 1. Moderate to high hypermetabolic activity associated with the expansile soft tissue masses within the LEFT and RIGHT pelvic bones is  consistent with active multiple myeloma / plasmacytoma. 2. Lytic lesion in the LEFT scapula with mild metabolic activity is indeterminate. 3. Metabolic activity associated with the RIGHT knee prosthetic and RIGHT distal foot fusion is favored post procedural inflammation.     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  The findings are most consistent with  persistent/recurrent/previously known plasma cell neoplasm.  For  completeness, immunohistochemical stain for CD138 and in situ  hybridization for kappa and lambda will be performed and the results  reported in an addendum.  Correlation with cytogenetic and FISH studies  is recommended.   08/03/2020 -  Chemotherapy   Zometa q4weeks starting 08/03/20    08/17/2020 - 11/29/2020 Chemotherapy   Velcade weekly, Revlimid, dex 46m weekly (VRd)  Starting 08/17/20. Last dose Velcade 11/23/20 and last dose revlimid on 11/29/20       INTERVAL HISTORY:  Hannah [name] here for a follow up of MM. She was last seen by me on 09/24/21. She presents to the clinic alone. She reports she is now back to working full time. She reports she is tolerating  the Revlimid well with no noticeable side effects. She notes she was switched to the generic for this past cycle; she adds she does not have a copay.   All other systems were reviewed with the patient and are negative.  MEDICAL HISTORY:  Past Medical History:  Diagnosis Date    Allergy    Anemia    Anxiety    Arthritis    Asthma    Depression    Hypertension     SURGICAL HISTORY: Past Surgical History:  Procedure Laterality Date   CHOLECYSTECTOMY  1993   COLONOSCOPY     TOTAL KNEE ARTHROPLASTY Right 01/28/2016   TOTAL KNEE ARTHROPLASTY Right 01/28/2016   Procedure: RIGHT TOTAL KNEE ARTHROPLASTY;  Surgeon: [name], MD;  Location: [facility];  Service: Orthopedics;  Laterality: Right;   TRIGGER FINGER RELEASE Right 06/18/2014   Procedure: RIGHT LONG FINGER TRIGGER RELEASE;  Surgeon: [name], MD;  Location: [facility];  Service: Orthopedics;  Laterality: Right;   TUBAL LIGATION     VAGINAL HYSTERECTOMY Bilateral 11/04/2016   Procedure: HYSTERECTOMY VAGINAL with Bilateral Salpingectomy;  Surgeon: [name], MD;  Location: [facility];  Service: Gynecology;  Laterality: Bilateral;   WEIL OSTEOTOMY Right 07/07/2020   Procedure: RIGHT FOOT WEIL OSTEOTOMY 2, 3, AND 4 METATARSALS AND PROXIMAL INTERPHALANGEAL JOINT FUSION 2 & 4 TOES;  Surgeon: Newt Minion, MD;  Location: [facility];  Service: Orthopedics;  Laterality: Right;    I have reviewed the social history and family history with the patient and they are unchanged from previous note.  ALLERGIES:  is allergic to elemental sulfur and sulfa antibiotics.  MEDICATIONS:  Current Outpatient Medications  Medication Sig Dispense Refill   aspirin EC 81 MG tablet Take 81 mg by mouth daily. Swallow whole.     calcium carbonate (OSCAL) 1500 (600 Ca) MG TABS tablet Take 600 mg of elemental calcium by mouth daily.     Cholecalciferol (VITAMIN D3) 1.25 MG (50000 UT) CAPS Take 1 capsule by mouth once a week.     dapsone 100 MG tablet Take by mouth.     ferrous sulfate 325 (65 FE) MG tablet Take 325 mg by mouth 2 (two) times daily with a meal.     folic acid (FOLVITE) 1 MG tablet folic acid 1 mg tablet  TAKE 1 TABLET BY MOUTH EVERY DAY     lenalidomide (REVLIMID) 10 MG capsule  TAKE 1 CAPSULE BY MOUTH ONCE DAILY FOR 21 DAYS 21 capsule 0   naproxen sodium (ALEVE) 220 MG tablet as needed.     valACYclovir (VALTREX) 500 MG tablet Take 500 mg by mouth 2 (two) times daily.     vitamin C (ASCORBIC ACID) 500 MG tablet      No current facility-administered medications for this visit.    PHYSICAL EXAMINATION: ECOG PERFORMANCE STATUS: 1 - Symptomatic but completely ambulatory  Vitals:   11/24/21 0808  BP: 134/88  Pulse: (!) 107  Resp: 18  Temp: 98.1 F (36.7 C)  SpO2: 99%   Wt Readings from Last 3 Encounters:  11/24/21 212 lb 14.4 oz (96.6 kg)  09/24/21 210 lb (95.3 kg)  07/23/21 196 lb 8 oz (89.1 kg)     GENERAL:alert, no distress and comfortable SKIN: skin color normal, no rashes or significant lesions EYES: normal, Conjunctiva are pink and non-injected, sclera clear  NEURO: alert & oriented x 3 with fluent speech  LABORATORY DATA:  I have reviewed the data  as listed    Latest Ref Rng & Units 11/24/2021    7:51 AM 09/24/2021    2:50 PM 08/20/2021    2:45 PM  CBC  WBC 4.0 - 10.5 K/uL 5.4  7.4  5.2   Hemoglobin 12.0 - 15.0 g/dL 10.7  11.5  11.0   Hematocrit 36.0 - 46.0 % 32.7  34.9  33.6   Platelets 150 - 400 K/uL 180  209  181         Latest Ref Rng & Units 11/24/2021    7:51 AM 09/24/2021    2:50 PM 08/20/2021    2:45 PM  CMP  Glucose 70 - 99 mg/dL 100  108  79   BUN 6 - 20 mg/dL _0 Creatinine 0.44 - 1.00 mg/dL 0.87  0.76  0.83   Sodium 135 - 145 mmol/L 140  141  141   Potassium 3.5 - 5.1 mmol/L 3.8  3.7  3.4   Chloride 98 - 111 mmol/L 106  107  105   CO2 22 - 32 mmol/L _1 Calcium 8.9 - 10.3 mg/dL 8.8  8.9  8.9   Total Protein 6.5 - 8.1 g/dL 6.8  7.3  7.0   Total Bilirubin 0.3 - 1.2 mg/dL 0.4  0.3  0.3   Alkaline Phos 38 - 126 U/L 73  73  74   AST 15 - 41 U/L _2 ALT 0 - 44 U/L _3 RADIOGRAPHIC STUDIES: I have personally reviewed the radiological images as listed and agreed with the findings in  the report. No results found.    No orders of the defined types were placed in this encounter.  All questions were answered. The patient knows to call the clinic with any problems, questions or concerns. No barriers to learning was detected. The total time spent in the appointment was 30 minutes.     Truitt Merle, MD 11/24/2021   I, Wilburn Mylar, am acting as scribe for Truitt Merle, MD.   I have reviewed the above documentation for accuracy and completeness, and I agree with the above.

## 2021-11-26 ENCOUNTER — Telehealth: Payer: Self-pay | Admitting: Hematology

## 2021-11-26 LAB — KAPPA/LAMBDA LIGHT CHAINS
Kappa free light chain: 31.9 mg/L — ABNORMAL HIGH (ref 3.3–19.4)
Kappa, lambda light chain ratio: 1.63 (ref 0.26–1.65)
Lambda free light chains: 19.6 mg/L (ref 5.7–26.3)

## 2021-11-26 NOTE — Telephone Encounter (Signed)
Left message with follow-up appointments per 6/28 los. 

## 2021-11-30 LAB — MULTIPLE MYELOMA PANEL, SERUM
Albumin SerPl Elph-Mcnc: 3.5 g/dL (ref 2.9–4.4)
Albumin/Glob SerPl: 1.3 (ref 0.7–1.7)
Alpha 1: 0.2 g/dL (ref 0.0–0.4)
Alpha2 Glob SerPl Elph-Mcnc: 0.6 g/dL (ref 0.4–1.0)
B-Globulin SerPl Elph-Mcnc: 1 g/dL (ref 0.7–1.3)
Gamma Glob SerPl Elph-Mcnc: 1.1 g/dL (ref 0.4–1.8)
Globulin, Total: 2.9 g/dL (ref 2.2–3.9)
IgA: 146 mg/dL (ref 87–352)
IgG (Immunoglobin G), Serum: 1319 mg/dL (ref 586–1602)
IgM (Immunoglobulin M), Srm: 52 mg/dL (ref 26–217)
Total Protein ELP: 6.4 g/dL (ref 6.0–8.5)

## 2021-12-08 ENCOUNTER — Ambulatory Visit: Payer: BC Managed Care – PPO

## 2021-12-09 ENCOUNTER — Other Ambulatory Visit: Payer: Self-pay | Admitting: Hematology

## 2021-12-10 ENCOUNTER — Other Ambulatory Visit: Payer: Self-pay

## 2021-12-10 MED ORDER — LENALIDOMIDE 10 MG PO CAPS: ORAL_CAPSULE | ORAL | 0 refills | Status: DC

## 2021-12-13 ENCOUNTER — Other Ambulatory Visit: Payer: Self-pay | Admitting: Hematology

## 2021-12-15 ENCOUNTER — Inpatient Hospital Stay: Payer: BC Managed Care – PPO | Attending: Hematology

## 2021-12-17 ENCOUNTER — Other Ambulatory Visit: Payer: Self-pay

## 2021-12-20 ENCOUNTER — Other Ambulatory Visit: Payer: Self-pay

## 2021-12-27 ENCOUNTER — Other Ambulatory Visit: Payer: Self-pay

## 2021-12-28 ENCOUNTER — Other Ambulatory Visit: Payer: Self-pay

## 2021-12-28 ENCOUNTER — Inpatient Hospital Stay: Payer: BC Managed Care – PPO | Attending: Hematology

## 2021-12-28 ENCOUNTER — Inpatient Hospital Stay: Payer: BC Managed Care – PPO

## 2021-12-28 VITALS — BP 151/91 | HR 81 | Resp 16

## 2021-12-28 DIAGNOSIS — F32A Depression, unspecified: Secondary | ICD-10-CM | POA: Diagnosis not present

## 2021-12-28 DIAGNOSIS — F419 Anxiety disorder, unspecified: Secondary | ICD-10-CM | POA: Insufficient documentation

## 2021-12-28 DIAGNOSIS — Z79899 Other long term (current) drug therapy: Secondary | ICD-10-CM | POA: Insufficient documentation

## 2021-12-28 DIAGNOSIS — C9000 Multiple myeloma not having achieved remission: Secondary | ICD-10-CM | POA: Insufficient documentation

## 2021-12-28 DIAGNOSIS — M1990 Unspecified osteoarthritis, unspecified site: Secondary | ICD-10-CM | POA: Diagnosis not present

## 2021-12-28 DIAGNOSIS — E669 Obesity, unspecified: Secondary | ICD-10-CM | POA: Insufficient documentation

## 2021-12-28 DIAGNOSIS — I10 Essential (primary) hypertension: Secondary | ICD-10-CM | POA: Insufficient documentation

## 2021-12-28 DIAGNOSIS — Z5321 Procedure and treatment not carried out due to patient leaving prior to being seen by health care provider: Secondary | ICD-10-CM | POA: Diagnosis not present

## 2021-12-28 DIAGNOSIS — Z882 Allergy status to sulfonamides status: Secondary | ICD-10-CM | POA: Diagnosis not present

## 2021-12-28 DIAGNOSIS — Z9049 Acquired absence of other specified parts of digestive tract: Secondary | ICD-10-CM | POA: Diagnosis not present

## 2021-12-28 DIAGNOSIS — Z9079 Acquired absence of other genital organ(s): Secondary | ICD-10-CM | POA: Diagnosis not present

## 2021-12-28 DIAGNOSIS — Z7961 Long term (current) use of immunomodulator: Secondary | ICD-10-CM | POA: Diagnosis not present

## 2021-12-28 DIAGNOSIS — D509 Iron deficiency anemia, unspecified: Secondary | ICD-10-CM | POA: Diagnosis not present

## 2021-12-28 DIAGNOSIS — Z9484 Stem cells transplant status: Secondary | ICD-10-CM | POA: Diagnosis not present

## 2021-12-28 DIAGNOSIS — C9001 Multiple myeloma in remission: Secondary | ICD-10-CM

## 2021-12-28 LAB — CBC WITH DIFFERENTIAL (CANCER CENTER ONLY)
Abs Immature Granulocytes: 0.03 10*3/uL (ref 0.00–0.07)
Basophils Absolute: 0 10*3/uL (ref 0.0–0.1)
Basophils Relative: 1 %
Eosinophils Absolute: 0.3 10*3/uL (ref 0.0–0.5)
Eosinophils Relative: 7 %
HCT: 32.2 % — ABNORMAL LOW (ref 36.0–46.0)
Hemoglobin: 10.8 g/dL — ABNORMAL LOW (ref 12.0–15.0)
Immature Granulocytes: 1 %
Lymphocytes Relative: 30 %
Lymphs Abs: 1.6 10*3/uL (ref 0.7–4.0)
MCH: 24.4 pg — ABNORMAL LOW (ref 26.0–34.0)
MCHC: 33.5 g/dL (ref 30.0–36.0)
MCV: 72.9 fL — ABNORMAL LOW (ref 80.0–100.0)
Monocytes Absolute: 0.2 10*3/uL (ref 0.1–1.0)
Monocytes Relative: 4 %
Neutro Abs: 3.1 10*3/uL (ref 1.7–7.7)
Neutrophils Relative %: 57 %
Platelet Count: 172 10*3/uL (ref 150–400)
RBC: 4.42 MIL/uL (ref 3.87–5.11)
RDW: 18.3 % — ABNORMAL HIGH (ref 11.5–15.5)
WBC Count: 5.2 10*3/uL (ref 4.0–10.5)
nRBC: 0 % (ref 0.0–0.2)

## 2021-12-28 LAB — CMP (CANCER CENTER ONLY)
ALT: 9 U/L (ref 0–44)
AST: 14 U/L — ABNORMAL LOW (ref 15–41)
Albumin: 3.9 g/dL (ref 3.5–5.0)
Alkaline Phosphatase: 67 U/L (ref 38–126)
Anion gap: 5 (ref 5–15)
BUN: 13 mg/dL (ref 6–20)
CO2: 30 mmol/L (ref 22–32)
Calcium: 8.8 mg/dL — ABNORMAL LOW (ref 8.9–10.3)
Chloride: 106 mmol/L (ref 98–111)
Creatinine: 0.84 mg/dL (ref 0.44–1.00)
GFR, Estimated: >60 mL/min (ref >60)
Glucose, Bld: 91 mg/dL (ref 70–99)
Potassium: 3.6 mmol/L (ref 3.5–5.1)
Sodium: 141 mmol/L (ref 135–145)
Total Bilirubin: 0.4 mg/dL (ref 0.3–1.2)
Total Protein: 7.1 g/dL (ref 6.5–8.1)

## 2021-12-28 MED ORDER — ZOLEDRONIC ACID 4 MG/100ML IV SOLN
4.0000 mg | Freq: Once | INTRAVENOUS | Status: AC
  Administered 2021-12-28: 4 mg via INTRAVENOUS
  Filled 2021-12-28: qty 100

## 2021-12-28 NOTE — Progress Notes (Signed)
CCa 8.9, Ca 8.8, Albumin 3.9  [address], [place], BCPS, BCOP 12/28/2021 3:19 PM

## 2021-12-28 NOTE — Patient Instructions (Signed)

## 2021-12-30 ENCOUNTER — Inpatient Hospital Stay: Payer: BC Managed Care – PPO

## 2022-01-03 ENCOUNTER — Other Ambulatory Visit: Payer: Self-pay

## 2022-01-10 ENCOUNTER — Other Ambulatory Visit: Payer: Self-pay | Admitting: Hematology

## 2022-01-13 ENCOUNTER — Telehealth: Payer: Self-pay | Admitting: *Deleted

## 2022-01-13 NOTE — Chronic Care Management (AMB) (Signed)
  Care Coordination   Note   01/13/2022 Name: KEWANNA KASPRZAK MRN: [id number] DOB: [birth year]  MYEESHA SHANE is a 61 y.o. year old female who sees Tisovec, Fransico Him, MD for primary care. I reached out to [name] by phone today to offer care coordination services.  Ms. Hardie was given information about Care Coordination services today including:   The Care Coordination services include support from the care team which includes your Nurse Coordinator, Clinical Social Worker, or Pharmacist.  The Care Coordination team is here to help remove barriers to the health concerns and goals most important to you. Care Coordination services are voluntary, and the patient may decline or stop services at any time by request to their care team member.   Care Coordination Consent Status: Patient agreed to services and verbal consent obtained.   Follow up plan:  Telephone appointment with care coordination team member scheduled for:  01/17/22  Encounter Outcome:  Pt. Scheduled  [place]  Direct Dial: [phone]

## 2022-01-17 ENCOUNTER — Ambulatory Visit: Payer: Self-pay

## 2022-01-17 NOTE — Patient Outreach (Signed)
  Care Coordination   01/17/2022 Name: Hannah [name] MRN: [id number] DOB: [birth year]   Care Coordination Outreach Attempts:  An unsuccessful telephone outreach was attempted today to offer the patient information about available care coordination services as a benefit of their health plan.   Follow Up Plan:  Additional outreach attempts will be made to offer the patient care coordination information and services.   Encounter Outcome:  No Answer  Care Coordination Interventions Activated:  No   Care Coordination Interventions:  No, not indicated    Daneen Schick, BSW, CDP Social Worker, Certified Dementia Practitioner Care Coordination [phone]

## 2022-01-25 DIAGNOSIS — Z9484 Stem cells transplant status: Secondary | ICD-10-CM | POA: Diagnosis not present

## 2022-01-25 DIAGNOSIS — C9000 Multiple myeloma not having achieved remission: Secondary | ICD-10-CM | POA: Diagnosis not present

## 2022-02-03 ENCOUNTER — Ambulatory Visit: Payer: Self-pay

## 2022-02-03 NOTE — Patient Instructions (Signed)
Visit Information  Thank you for taking time to visit with me today. Please don't hesitate to contact me if I can be of assistance to you.   Following are the goals we discussed today:   Goals Addressed             This Visit's Progress    Care Coordination Activities - BSW Plan of Care       Care Coordination Interventions: SDoH screening performed - no acute resource needs identified at this time Fall risk screening performed - patient is a low fall risk Discussed the patient does not have an advance directive Education provided on the benefit of advance care planning - patient is interested in completing an advance directive Mailed patient an advance directive packet - reviewed process for completion encouraging patient to contact SW if further assistance is needed Determined the patient does not have concerns with medication costs and/or adherence at this time Education provided on the role of the care coordination team Determined the patient is interested in speaking with LCSW to develop strategies to address anxiety and stress over returning to work; telephonic appointment scheduled with LCSW Christa See for 9/13 at 9:00 am Discussed the patient lost a 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 States "I feel 34-16 years older than I used to" Determined patient is currently participating in aquatic physical therapy but will end soon. Patient is interested in continued exercise to regain strength and mobility Education on PREP program - patient interested in pursuing Outbound call placed to [facility] in order to obtain referral for program; voice message left for [facility] requesting verbal orders from Dr. Osborne Casco Education on [place] classes; mailed schedule to the patients home for review        Your next appointment with Christa See  is by  telephone on 9/13 at 9:00  Please call the care guide team at [phone] if you need to cancel or reschedule your appointment.   If you are experiencing a Mental Health or [place] or need someone to talk to, please call 1-800-273-TALK (toll free, 24 hour hotline)  Patient verbalizes understanding of instructions and care plan provided today and agrees to view in [place]. Active MyChart status and patient understanding of how to access instructions and care plan via MyChart confirmed with patient.     I will send your referral to the PREP program for continued exercise once I receive verbal orders from your primary care provider. Please contact me as needed.  Daneen Schick, BSW, CDP Social Worker, Certified Dementia Practitioner Care Coordination [phone]

## 2022-02-03 NOTE — Patient Outreach (Signed)
  Care Coordination   Initial Visit Note   02/03/2022 Name: Hannah [name] MRN: [id number] DOB: April 29, 1961  Hannah [name] is a 61 y.o. year old female who sees Tisovec, Fransico Him, MD for primary care. I spoke with  Hannah [name] by phone today.  What matters to the patients health and wellness today?  I want to continue exercising in water    Goals Addressed             This Visit's Progress    Care Coordination Activities - BSW Plan of Care       Care Coordination Interventions: SDoH screening performed - no acute resource needs identified at this time Fall risk screening performed - patient is a low fall risk Discussed the patient does not have an advance directive Education provided on the benefit of advance care planning - patient is interested in completing an advance directive Mailed patient an advance directive packet - reviewed process for completion encouraging patient to contact SW if further assistance is needed Determined the patient does not have concerns with medication costs and/or adherence at this time Education provided on the role of the care coordination team Determined the patient is interested in speaking with LCSW to develop strategies to address anxiety and stress over returning to work; telephonic appointment scheduled with LCSW Christa See for 9/13 at 9:00 am Discussed the patient lost a 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 States "I feel 14-103 years older than I used to" Determined patient is currently participating in aquatic physical therapy but will end soon. Patient is interested in continued exercise to regain strength and mobility Education on PREP program - patient interested in pursuing Outbound call placed to [facility] in order to obtain referral for program; voice message left for [facility] requesting verbal orders from Dr.  Osborne Casco Education on [place] classes; mailed schedule to the patients home for review        SDOH assessments and interventions completed:  Yes  SDOH Interventions Today    Flowsheet Row Most Recent Value  SDOH Interventions   Food Insecurity Interventions Intervention Not Indicated  Housing Interventions Intervention Not Indicated  Transportation Interventions Intervention Not Indicated  Utilities Interventions Intervention Not Indicated  Financial Strain Interventions Intervention Not Indicated        Care Coordination Interventions Activated:  Yes  Care Coordination Interventions:  Yes, provided   Follow up plan: Follow up call scheduled for 9/13 with LCSW Christa See SW will await response from Dr. Osborne Casco regarding PREP referral    Encounter Outcome:  Pt. Visit Completed   Daneen Schick, [place], CDP Social Worker, Certified Dementia Practitioner Care Coordination [phone]

## 2022-02-04 ENCOUNTER — Telehealth: Payer: Self-pay

## 2022-02-04 ENCOUNTER — Ambulatory Visit: Payer: Self-pay

## 2022-02-04 DIAGNOSIS — C9000 Multiple myeloma not having achieved remission: Secondary | ICD-10-CM

## 2022-02-04 DIAGNOSIS — I10 Essential (primary) hypertension: Secondary | ICD-10-CM

## 2022-02-04 NOTE — Addendum Note (Signed)
Addended byDaneen Schick on: 02/04/2022 11:43 AM   Modules accepted: Orders

## 2022-02-04 NOTE — Patient Outreach (Addendum)
  Care Coordination   Follow Up Visit Note   02/04/2022 Name: Hannah [name] MRN: [id number] DOB: [birth year]  Hannah [name] is a 61 y.o. year old female who sees Tisovec, Fransico Him, MD for primary care. I  collaborated with patients primary care providers office in order to obtain verbal orders.  What matters to the patients health and wellness today?  Referral to PREP program    Goals Addressed             This Visit's Progress    COMPLETED: Care Coordination Activities - BSW Plan of Care       Care Coordination Interventions: Received verbal orders from Dr. Osborne Casco for patient referral to [place] program Referral placed to PREP program Patient will be contacted directly by PREP program         SDOH assessments and interventions completed:  No     Care Coordination Interventions Activated:  Yes  Care Coordination Interventions:  Yes, provided   Follow up plan: Referral made to PREP program    Encounter Outcome:  Pt. Visit Completed   Daneen Schick, BSW, CDP Social Worker, Certified Dementia Practitioner Care Coordination [phone]

## 2022-02-04 NOTE — Telephone Encounter (Signed)
VMT pt requesting call back to discuss PREP

## 2022-02-08 ENCOUNTER — Other Ambulatory Visit: Payer: Self-pay | Admitting: Hematology

## 2022-02-09 ENCOUNTER — Ambulatory Visit: Payer: Self-pay | Admitting: Licensed Clinical Social Worker

## 2022-02-09 ENCOUNTER — Telehealth: Payer: Self-pay

## 2022-02-09 NOTE — Telephone Encounter (Signed)
Call from pt this am.  Explained program to pt. Possibly interested in doing class.  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  Explained will need to call her back with the next morning class.  Lives closest to [place]

## 2022-02-10 ENCOUNTER — Other Ambulatory Visit: Payer: Self-pay

## 2022-02-10 MED ORDER — LENALIDOMIDE 10 MG PO CAPS: ORAL_CAPSULE | ORAL | 0 refills | Status: DC

## 2022-02-22 NOTE — Patient Instructions (Signed)
Visit Information  Thank you for taking time to visit with me today. Please don't hesitate to contact me if I can be of assistance to you.   Following are the goals we discussed today:   Goals Addressed             This Visit's Progress    Care Coordination-Management of Dep/Anx Symptoms   On track    Care Coordination Interventions: Solution-Focused Strategies employed:  Mindfulness or Relaxation training provided Active listening / Reflection utilized  Emotional Support Provided Pt identified triggers and healthy coping skills to combat symptoms. Pt is not interested in med management LCSW encouraged pt to practice self-care on routine basis LCSW will continue to assess and provide pt with local resources for counseling        Our next appointment is by telephone on 02/23/22 at 9 AM  Please call the care guide team at [phone] if you need to cancel or reschedule your appointment.   If you are experiencing a Mental Health or [place] or need someone to talk to, please call the Suicide and Crisis Lifeline: 988 call 911   Patient verbalizes understanding of instructions and care plan provided today and agrees to view in [place]. Active MyChart status and patient understanding of how to access instructions and care plan via MyChart confirmed with patient.     Christa See, MSW, [email or web address] Phone [phone] 7:39 AM

## 2022-02-22 NOTE — Patient Outreach (Signed)
  Care Coordination   Initial Visit Note   02/22/2022 Name: Hannah [name] MRN: [id number] DOB: Apr 25, 1961  Hannah [name] is a 61 y.o. year old female who sees Hannah [name], Hannah Him, MD for primary care. I spoke with  Hannah [name] by phone today.  What matters to the patients health and wellness today?  Management of mental health conditions    Goals Addressed             This Visit's Progress    Care Coordination-Management of Dep/Anx Symptoms   On track    Care Coordination Interventions: Solution-Focused Strategies employed:  Mindfulness or Relaxation training provided Active listening / Reflection utilized  Emotional Support Provided Pt identified triggers and healthy coping skills to combat symptoms. Pt is not interested in med management LCSW encouraged pt to practice self-care on routine basis LCSW will continue to assess and provide pt with local resources for counseling        SDOH assessments and interventions completed:  No     Care Coordination Interventions Activated:  Yes  Care Coordination Interventions:  Yes, provided   Follow up plan: Follow up call scheduled for 02/23/22    Encounter Outcome:  Pt. Visit Completed   Hannah [name], MSW, [email or web address] Phone [phone] 7:38 AM

## 2022-02-23 ENCOUNTER — Ambulatory Visit: Payer: Self-pay | Admitting: Licensed Clinical Social Worker

## 2022-03-01 NOTE — Patient Outreach (Signed)
  Care Coordination   Follow Up Visit Note   03/01/2022 Name: CAI FLOTT MRN: [id number] DOB: 10-04-1960  Hannah [name] is a 61 y.o. year old female who sees Tisovec, Fransico Him, MD for primary care. I spoke with  [name] by phone today.  What matters to the patients health and wellness today?  Management of MH conditions and establishing with therapist    Goals Addressed             This Visit's Progress    Care Coordination-Management of Dep/Anx Symptoms   On track    Care Coordination Interventions: Solution-Focused Strategies employed:  Mindfulness or Relaxation training provided Active listening / Reflection utilized  Emotional Support Provided Pt reports symptoms have remained the same LCSW encouraged pt to practice self-care on routine basis LCSW will continue to assess and provide pt with local resources for counseling        SDOH assessments and interventions completed:  No     Care Coordination Interventions Activated:  Yes  Care Coordination Interventions:  Yes, provided   Follow up plan: Follow up call scheduled for 2-4 weeks    Encounter Outcome:  Pt. Visit Completed   Christa See, MSW, [email or web address] Phone [phone] 9:30 AM

## 2022-03-01 NOTE — Patient Instructions (Signed)
Visit Information  Thank you for taking time to visit with me today. Please don't hesitate to contact me if I can be of assistance to you.   Following are the goals we discussed today:   Goals Addressed             This Visit's Progress    Care Coordination-Management of Dep/Anx Symptoms   On track    Care Coordination Interventions: Solution-Focused Strategies employed:  Mindfulness or Relaxation training provided Active listening / Reflection utilized  Emotional Support Provided Pt reports symptoms have remained the same LCSW encouraged pt to practice self-care on routine basis LCSW will continue to assess and provide pt with local resources for counseling        Our next appointment is by telephone on 03/08/22 at 10 AM  Please call the care guide team at [phone] if you need to cancel or reschedule your appointment.   If you are experiencing a Mental Health or [place] or need someone to talk to, please call the Suicide and Crisis Lifeline: 988 call 911   Patient verbalizes understanding of instructions and care plan provided today and agrees to view in [place]. Active MyChart status and patient understanding of how to access instructions and care plan via MyChart confirmed with patient.     Christa See, MSW, [email or web address] Phone [phone] 9:32 AM

## 2022-03-08 ENCOUNTER — Ambulatory Visit: Payer: Self-pay | Admitting: Licensed Clinical Social Worker

## 2022-03-08 ENCOUNTER — Other Ambulatory Visit: Payer: Self-pay | Admitting: Hematology

## 2022-03-09 ENCOUNTER — Other Ambulatory Visit: Payer: Self-pay

## 2022-03-09 ENCOUNTER — Inpatient Hospital Stay: Payer: BC Managed Care – PPO

## 2022-03-09 ENCOUNTER — Inpatient Hospital Stay: Payer: BC Managed Care – PPO | Attending: Hematology | Admitting: Hematology

## 2022-03-09 VITALS — BP 146/80 | HR 66 | Resp 18

## 2022-03-09 VITALS — BP 118/77 | HR 79 | Temp 98.3°F | Resp 18 | Ht 66.0 in | Wt 218.3 lb

## 2022-03-09 DIAGNOSIS — M1990 Unspecified osteoarthritis, unspecified site: Secondary | ICD-10-CM | POA: Insufficient documentation

## 2022-03-09 DIAGNOSIS — Z882 Allergy status to sulfonamides status: Secondary | ICD-10-CM | POA: Insufficient documentation

## 2022-03-09 DIAGNOSIS — D509 Iron deficiency anemia, unspecified: Secondary | ICD-10-CM | POA: Insufficient documentation

## 2022-03-09 DIAGNOSIS — C9001 Multiple myeloma in remission: Secondary | ICD-10-CM

## 2022-03-09 DIAGNOSIS — Z9079 Acquired absence of other genital organ(s): Secondary | ICD-10-CM | POA: Insufficient documentation

## 2022-03-09 DIAGNOSIS — Z7961 Long term (current) use of immunomodulator: Secondary | ICD-10-CM | POA: Diagnosis not present

## 2022-03-09 DIAGNOSIS — F419 Anxiety disorder, unspecified: Secondary | ICD-10-CM | POA: Diagnosis not present

## 2022-03-09 DIAGNOSIS — I10 Essential (primary) hypertension: Secondary | ICD-10-CM | POA: Insufficient documentation

## 2022-03-09 DIAGNOSIS — C9000 Multiple myeloma not having achieved remission: Secondary | ICD-10-CM | POA: Insufficient documentation

## 2022-03-09 DIAGNOSIS — E669 Obesity, unspecified: Secondary | ICD-10-CM | POA: Diagnosis not present

## 2022-03-09 DIAGNOSIS — Z9049 Acquired absence of other specified parts of digestive tract: Secondary | ICD-10-CM | POA: Diagnosis not present

## 2022-03-09 DIAGNOSIS — F32A Depression, unspecified: Secondary | ICD-10-CM | POA: Diagnosis not present

## 2022-03-09 DIAGNOSIS — Z9484 Stem cells transplant status: Secondary | ICD-10-CM | POA: Diagnosis not present

## 2022-03-09 DIAGNOSIS — Z79899 Other long term (current) drug therapy: Secondary | ICD-10-CM | POA: Diagnosis not present

## 2022-03-09 LAB — CMP (CANCER CENTER ONLY)
ALT: 9 U/L (ref 0–44)
AST: 12 U/L — ABNORMAL LOW (ref 15–41)
Albumin: 3.8 g/dL (ref 3.5–5.0)
Alkaline Phosphatase: 71 U/L (ref 38–126)
Anion gap: 6 (ref 5–15)
BUN: 12 mg/dL (ref 6–20)
CO2: 29 mmol/L (ref 22–32)
Calcium: 8.5 mg/dL — ABNORMAL LOW (ref 8.9–10.3)
Chloride: 106 mmol/L (ref 98–111)
Creatinine: 0.82 mg/dL (ref 0.44–1.00)
GFR, Estimated: >60 mL/min (ref >60)
Glucose, Bld: 102 mg/dL — ABNORMAL HIGH (ref 70–99)
Potassium: 3.6 mmol/L (ref 3.5–5.1)
Sodium: 141 mmol/L (ref 135–145)
Total Bilirubin: 0.5 mg/dL (ref 0.3–1.2)
Total Protein: 7.1 g/dL (ref 6.5–8.1)

## 2022-03-09 LAB — CBC WITH DIFFERENTIAL (CANCER CENTER ONLY)
Abs Immature Granulocytes: 0.03 10*3/uL (ref 0.00–0.07)
Basophils Absolute: 0 10*3/uL (ref 0.0–0.1)
Basophils Relative: 0 %
Eosinophils Absolute: 0.4 10*3/uL (ref 0.0–0.5)
Eosinophils Relative: 6 %
HCT: 35 % — ABNORMAL LOW (ref 36.0–46.0)
Hemoglobin: 11.5 g/dL — ABNORMAL LOW (ref 12.0–15.0)
Immature Granulocytes: 1 %
Lymphocytes Relative: 34 %
Lymphs Abs: 2.1 10*3/uL (ref 0.7–4.0)
MCH: 23.9 pg — ABNORMAL LOW (ref 26.0–34.0)
MCHC: 32.9 g/dL (ref 30.0–36.0)
MCV: 72.6 fL — ABNORMAL LOW (ref 80.0–100.0)
Monocytes Absolute: 0.4 10*3/uL (ref 0.1–1.0)
Monocytes Relative: 6 %
Neutro Abs: 3.4 10*3/uL (ref 1.7–7.7)
Neutrophils Relative %: 53 %
Platelet Count: 165 10*3/uL (ref 150–400)
RBC: 4.82 MIL/uL (ref 3.87–5.11)
RDW: 16.5 % — ABNORMAL HIGH (ref 11.5–15.5)
WBC Count: 6.3 10*3/uL (ref 4.0–10.5)
nRBC: 0 % (ref 0.0–0.2)

## 2022-03-09 MED ORDER — SODIUM CHLORIDE 0.9 % IV SOLN: Freq: Once | INTRAVENOUS | Status: AC

## 2022-03-09 MED ORDER — SODIUM CHLORIDE 0.9 % IV SOLN
2.0000 g | Freq: Once | INTRAVENOUS | Status: AC
  Administered 2022-03-09: 2 g via INTRAVENOUS
  Filled 2022-03-09: qty 20

## 2022-03-09 MED ORDER — LENALIDOMIDE 10 MG PO CAPS: ORAL_CAPSULE | ORAL | 0 refills | Status: DC

## 2022-03-09 MED ORDER — ZOLEDRONIC ACID 4 MG/100ML IV SOLN
4.0000 mg | Freq: Once | INTRAVENOUS | Status: AC
  Administered 2022-03-09: 4 mg via INTRAVENOUS
  Filled 2022-03-09: qty 100

## 2022-03-09 NOTE — Progress Notes (Signed)
Per Dr. Burr Medico - okay to proceed with Zometa treatment today with calcium level of 8.5. Confirmed that patient does not have any upcoming dental procedures and has been taking daily calcium and vitamin D supplementation at home.  Patient to receive 2 g Ca today and advised to double home calcium supplementation per provider. Patient verbalized an understanding of these treatment changes.

## 2022-03-09 NOTE — Patient Outreach (Signed)
  Care Coordination   Follow Up Visit Note   03/09/2022 Name: Hannah [name] MRN: [id number] DOB: 1960-08-03  Hannah [name] is a 61 y.o. year old female who sees Tisovec, Fransico Him, MD for primary care. I spoke with  [name] by phone today.  What matters to the patients health and wellness today?  Counseling Resources    Goals Addressed             This Visit's Progress    Care Coordination-Management of Dep/Anx Symptoms   On track    Care Coordination Interventions: Solution-Focused Strategies employed:  Mindfulness or Relaxation training provided Active listening / Reflection utilized  Emotional Support Provided LCSW provided local counseling resources via email, confirmed by patient Patient was contacted by PREP, who will f/up with her         SDOH assessments and interventions completed:  No     Care Coordination Interventions Activated:  Yes  Care Coordination Interventions:  Yes, provided   Follow up plan: Follow up call scheduled for 04/06/22    Encounter Outcome:  Pt. Visit Completed   Christa See, MSW, [email or web address] Phone [phone] 10:13 AM

## 2022-03-09 NOTE — Patient Instructions (Signed)

## 2022-03-09 NOTE — Progress Notes (Signed)
[place]   Telephone:(336) [phone] Fax:(336) [phone]   Clinic Follow up Note   Patient Care Team: Tisovec, Fransico Him, MD as PCP - General (Internal Medicine) Bo Merino, MD as Consulting Physician (Rheumatology) Truitt Merle, MD as Consulting Physician (Hematology) Waymon Amato, MD as Consulting Physician (Obstetrics and Gynecology) Daneen Schick as [place] [facility] Rebekah [place]field, [place] as Social Worker (Licensed Clinical Social Worker)  Date of Service:  03/09/2022  CHIEF COMPLAINT: f/u of IgA kappa multiple myeloma  CURRENT THERAPY:  -Maintenance Revlimid 10 mg, days 1-21 q. 28 days started 05/03/21 -Zometa q62month, restarted 05/28/21  ASSESSMENT & PLAN:  Hannah COLFORDis a 61y.o. female with   1. 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   -bone marrow biopsy on 07/30/20 showed slightly hypercellular marrow for age and 31% plasma cells. Cytogenetics and FISH were unremarkable -she received VRD (weekly Velcade and dexamethasone, Revlimid 2 weeks on 1 week off) 08/17/20 - June 2022. She responded well. -She [facility] high-dose chemo with melphalan [facility] L[facility]is Lot  White cells engrafted 01/03/21 and platelets 01/13/21, last platelet transfusion 01/02/21 -posttreatment PET 04/06/21 showed NED  -posttreatment bone marrow biopsy 04/29/21 showed 10-20%  hypocellular marrow with myeloid hypoplasia and 3% plasma cells -s/p prophylactic valacyclovir for 1 year and dapsone for 6 months posttransplant -she will continue revaccination per [facility] protocol -she began maintenance Revlimid 10 mg daily days 1-21 q. 28 days on 05/03/21. She is tolerating well with no noticeable side effects.  -[facility] light chain to 36.19, IgA WNL. Repeat from today is pending. -labs reviewed, overall stable, will continue Revlimid. MM panels were performed today, will call her with results. -[facility], but she will f/u regarding vaccines.   2. history of low back pain, right hip pain -PET 07/24/20 showed large hypermetabolic lesions in the left and right pelvis and a faintly metabolic lesion in the left scapula -s/p palliative radiation to the pelvis, left scapula, T4-6, and T10-12 3/3-3/16/22. Pain resolved now. -She received 4 doses of monthly Zometa 3/7-11/02/20, resumed 05/28/21, will continue every 3 months for a total of 2 years  -repeat lumbar spine MRI on 09/02/21 showed interval treatment response with regression of large pelvis deposits and resolution of widespread lumbar involvement. -her pain has resolved    3. 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 -Hemoglobin electrophoresis showed increase in Hg C, consistent with Hg C trait.  She is aware her children are at risk of hemoglobin C trait -Continue supplements per transplant team -hgb stable, 11.5 today (03/09/22)   4. HTN, OA, Obesity, Cancer Screenings -On chlorthalidone for HTN -continue f/u with PCP, Chiropractor and Ortho.  -she is up-to-date on appropriate screening. Last mammogram 03/2021, colonoscopy due 10/2023.     Plan: -proceed with  Zometa today with iv calcium due to hypocalcemia  -continue Revlimid at same dose, refilled today   -lab, f/u, and Zometa in 3 months             -she prefers early morning appointments   No problem-specific Assessment & Plan notes found for this encounter.   SUMMARY OF ONCOLOGIC HISTORY: Oncology History Overview Note  Cancer Staging Multiple myeloma ([place]) Staging form: Plasma Cell Myeloma and Plasma Cell Disorders, AJCC 8th Edition - Clinical stage from 07/20/2020: Beta-2-microglobulin (mg/L): 2.4, Albumin (g/dL): 3.2, ISS: Stage II, High-risk cytogenetics: Unknown, LDH: Normal - Signed by Truitt Merle, MD on 08/02/2020 Beta 2 microglobulin range (mg/L): Less than 3.5 Albumin range (g/dL): Less than 3.5 Cytogenetics: Unknown    Multiple myeloma in remission ([place])  07/03/2020 Imaging   MRI Lumbar Spine  IMPRESSION: 1. Numerous T1 hypointense and STIR hyperintense lesions throughout the visualized spine and sacrum with multiple areas of extraosseous extension in the sacrum, detailed above and compatible with multiple myeloma given the clinical history. 2. An MRI of the pelvis with contrast could evaluate the full extent of the partially imaged sacral lesions, including left S1-S2 neural foraminal involvement and suspected involvement of the exited left L5 nerve. 3. Multilevel degenerative change without significant canal or foraminal stenosis in the lumbar spine.   07/13/2020 Initial Diagnosis   Multiple myeloma ([place])   07/20/2020 Cancer Staging   Staging form: Plasma Cell Myeloma and Plasma Cell Disorders, AJCC 8th Edition - Clinical stage from 07/20/2020: Beta-2-microglobulin (mg/L): 2.4, Albumin (g/dL): 3.2, ISS: Stage II, High-risk cytogenetics: Unknown, LDH: Normal - Signed by Truitt Merle, MD on 03/09/2022 Stage prefix: Initial diagnosis Beta 2 microglobulin range (mg/L): Less than 3.5 Albumin range (g/dL): Less than 3.5 Cytogenetics: No abnormalities Bone disease on imaging: Present   07/24/2020 PET scan   IMPRESSION: 1. Moderate to high hypermetabolic activity associated  with the expansile soft tissue masses within the LEFT and RIGHT pelvic bones is consistent with active multiple myeloma / plasmacytoma. 2. Lytic lesion in the LEFT scapula with mild metabolic activity is indeterminate. 3. Metabolic activity associated with the RIGHT knee prosthetic and RIGHT distal foot fusion is favored post procedural inflammation.     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  The findings are most consistent with  persistent/recurrent/previously known plasma cell neoplasm.  For  completeness, immunohistochemical stain for CD138 and in situ  hybridization for kappa and lambda will be performed and the results  reported in an addendum.  Correlation with cytogenetic and FISH studies  is recommended.   08/03/2020 -  Chemotherapy   Zometa q4weeks starting 08/03/20    08/17/2020 - 11/29/2020 Chemotherapy   Velcade weekly, Revlimid, dex 94m weekly (VRd)  Starting 08/17/20. Last dose Velcade 11/23/20 and last dose revlimid on 11/29/20       INTERVAL HISTORY:  Hannah MCKONEis here for a follow up of MM. She was last seen by me on 11/24/21. She presents to the clinic  alone. She reports she is doing well overall. She denies any new side effects. She does note some joint pain, but she attributes this to arthritis; she explains it's difficult to tell what's baseline arthritis versus side effect from revlimid.   All other systems were reviewed with  the patient and are negative.  MEDICAL HISTORY:  Past Medical History:  Diagnosis Date   Allergy    Anemia    Anxiety    Arthritis    Asthma    Depression    Hypertension     SURGICAL HISTORY: Past Surgical History:  Procedure Laterality Date   CHOLECYSTECTOMY  1993   COLONOSCOPY     TOTAL KNEE ARTHROPLASTY Right 01/28/2016   TOTAL KNEE ARTHROPLASTY Right 01/28/2016   Procedure: RIGHT TOTAL KNEE ARTHROPLASTY;  Surgeon: [name], MD;  Location: [facility];  Service: Orthopedics;  Laterality: Right;   TRIGGER FINGER RELEASE Right 06/18/2014   Procedure: RIGHT LONG FINGER TRIGGER RELEASE;  Surgeon: [name], MD;  Location: [facility];  Service: Orthopedics;  Laterality: Right;   TUBAL LIGATION     VAGINAL HYSTERECTOMY Bilateral 11/04/2016   Procedure: HYSTERECTOMY VAGINAL with Bilateral Salpingectomy;  Surgeon: [name], MD;  Location: [facility];  Service: Gynecology;  Laterality: Bilateral;   WEIL OSTEOTOMY Right 07/07/2020   Procedure: RIGHT FOOT WEIL OSTEOTOMY 2, 3, AND 4 METATARSALS AND PROXIMAL INTERPHALANGEAL JOINT FUSION 2 & 4 TOES;  Surgeon: Newt Minion, MD;  Location: [facility];  Service: Orthopedics;  Laterality: Right;    I have reviewed the social history and family history with the patient and they are unchanged from previous note.  ALLERGIES:  is allergic to elemental sulfur and sulfa antibiotics.  MEDICATIONS:  Current Outpatient Medications  Medication Sig Dispense Refill   aspirin EC 81 MG tablet Take 81 mg by mouth daily. Swallow whole.     calcium carbonate (OSCAL) 1500 (600 Ca) MG TABS tablet Take 600 mg of elemental calcium by mouth daily.     Cholecalciferol (VITAMIN D3) 1.25 MG (50000 UT) CAPS Take 1 capsule by mouth once a week.     dapsone 100 MG tablet Take by mouth.     ferrous sulfate 325 (65 FE) MG tablet Take 325 mg by mouth 2 (two) times daily with a meal.     folic acid (FOLVITE) 1 MG tablet folic acid  1 mg tablet  TAKE 1 TABLET BY MOUTH EVERY DAY     lenalidomide (REVLIMID) 10 MG capsule [employer] Auth # [id number]     Date Obtained 03/09/2022 21 capsule 0   naproxen sodium (ALEVE) 220 MG tablet as needed.     valACYclovir (VALTREX) 500 MG tablet Take 500 mg by mouth 2 (two) times daily.     vitamin C (ASCORBIC ACID) 500 MG tablet      No current facility-administered medications for this visit.   Facility-Administered Medications Ordered in Other Visits  Medication Dose Route Frequency Provider Last Rate Last Admin   Zoledronic Acid (ZOMETA) IVPB 4 mg  4 mg Intravenous Once Truitt Merle, MD 400 mL/hr at 03/09/22 0921 4 mg at 03/09/22 0921    PHYSICAL EXAMINATION: ECOG PERFORMANCE STATUS: 0 - Asymptomatic  Vitals:   03/09/22 0830  BP: 118/77  Pulse: 79  Resp: 18  Temp: 98.3 F (36.8 C)  SpO2: 98%   Wt Readings from Last 3 Encounters:  03/09/22 218 lb 4.8 oz (99 kg)  11/24/21 212 lb 14.4 oz (96.6  kg)  09/24/21 210 lb (95.3 kg)     GENERAL:alert, no distress and comfortable SKIN: skin color normal, no rashes or significant lesions EYES: normal, Conjunctiva are pink and non-injected, sclera clear  NEURO: alert & oriented x 3 with fluent speech  LABORATORY DATA:  I have reviewed the data as listed    Latest Ref Rng & Units 03/09/2022    8:10 AM 12/28/2021    2:39 PM 11/24/2021    7:51 AM  CBC  WBC 4.0 - 10.5 K/uL 6.3  5.2  5.4   Hemoglobin 12.0 - 15.0 g/dL 11.5  10.8  10.7   Hematocrit 36.0 - 46.0 % 35.0  32.2  32.7   Platelets 150 - 400 K/uL 165  172  180         Latest Ref Rng & Units 03/09/2022    8:10 AM 12/28/2021    2:39 PM 11/24/2021    7:51 AM  CMP  Glucose 70 - 99 mg/dL 102  91  100   BUN 6 - 20 mg/dL _0 Creatinine 0.44 - 1.00 mg/dL 0.82  0.84  0.87   Sodium 135 - 145 mmol/L 141  141  140   Potassium 3.5 - 5.1 mmol/L 3.6  3.6  3.8   Chloride 98 - 111 mmol/L 106  106  106   CO2 22 - 32 mmol/L _1 Calcium 8.9 - 10.3 mg/dL 8.5  8.8  8.8    Total Protein 6.5 - 8.1 g/dL 7.1  7.1  6.8   Total Bilirubin 0.3 - 1.2 mg/dL 0.5  0.4  0.4   Alkaline Phos 38 - 126 U/L 71  67  73   AST 15 - 41 U/L _2 ALT 0 - 44 U/L _3 RADIOGRAPHIC STUDIES: I have personally reviewed the radiological images as listed and agreed with the findings in the report. No results found.    Orders Placed This Encounter  Procedures   Ferritin    Standing Status:   Future    Standing Expiration Date:   03/10/2023   Iron and TIBC    Standing Status:   Future    Standing Expiration Date:   03/09/2023   All questions were answered. The patient knows to call the clinic with any problems, questions or concerns. No barriers to learning was detected. The total time spent in the appointment was 30 minutes.     Truitt Merle, MD 03/09/2022   I, Wilburn Mylar, am acting as scribe for Truitt Merle, MD.   I have reviewed the above documentation for accuracy and completeness, and I agree with the above.

## 2022-03-09 NOTE — Patient Instructions (Signed)
Visit Information  Thank you for taking time to visit with me today. Please don't hesitate to contact me if I can be of assistance to you.   Following are the goals we discussed today:   Goals Addressed             This Visit's Progress    Care Coordination-Management of Dep/Anx Symptoms   On track    Care Coordination Interventions: Solution-Focused Strategies employed:  Mindfulness or Relaxation training provided Active listening / Reflection utilized  Emotional Support Provided LCSW provided local counseling resources via email, confirmed by patient Patient was contacted by PREP, who will f/up with her         Our next appointment is by telephone on 04/06/22 at 9 AM  Please call the care guide team at [phone] if you need to cancel or reschedule your appointment.   If you are experiencing a Mental Health or [place] or need someone to talk to, please call the Suicide and Crisis Lifeline: 988 call 911   Patient verbalizes understanding of instructions and care plan provided today and agrees to view in [place]. Active MyChart status and patient understanding of how to access instructions and care plan via MyChart confirmed with patient.     Christa See, MSW, [name] Phone [phone] 10:14 AM

## 2022-03-10 LAB — KAPPA/LAMBDA LIGHT CHAINS
Kappa free light chain: 36 mg/L — ABNORMAL HIGH (ref 3.3–19.4)
Kappa, lambda light chain ratio: 1.83 — ABNORMAL HIGH (ref 0.26–1.65)
Lambda free light chains: 19.7 mg/L (ref 5.7–26.3)

## 2022-03-11 ENCOUNTER — Other Ambulatory Visit: Payer: Self-pay

## 2022-03-11 ENCOUNTER — Other Ambulatory Visit: Payer: Self-pay | Admitting: Hematology

## 2022-03-11 MED ORDER — LENALIDOMIDE 10 MG PO CAPS: ORAL_CAPSULE | ORAL | 0 refills | Status: DC

## 2022-03-14 ENCOUNTER — Other Ambulatory Visit: Payer: Self-pay

## 2022-03-14 DIAGNOSIS — C9000 Multiple myeloma not having achieved remission: Secondary | ICD-10-CM | POA: Diagnosis not present

## 2022-03-14 DIAGNOSIS — Z23 Encounter for immunization: Secondary | ICD-10-CM | POA: Diagnosis not present

## 2022-03-14 DIAGNOSIS — Z9484 Stem cells transplant status: Secondary | ICD-10-CM | POA: Diagnosis not present

## 2022-03-14 IMAGING — MR MR LUMBAR SPINE W/O CM
4 of 5 series · 18 of 48 positions shown · non-contrast
Comparison: MRI 12/14/2017.

CLINICAL DATA: Low back pain. Bilateral hip pain with numbness in
left foot. Per chart review, the patient has known multiple myeloma.

EXAM:
MRI LUMBAR SPINE WITHOUT CONTRAST
TECHNIQUE: Multiplanar, multisequence MR imaging of the lumbar spine was
performed. No intravenous contrast was administered.

[Series 6: T2 · sagittal · 4.0mm · 0.73mm/px · 6 of 15 slices shown (1 of 2)]
[im 1/15]
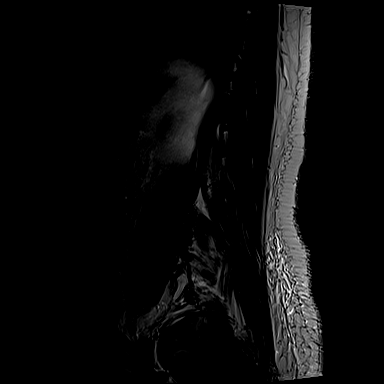
[im 3/15]
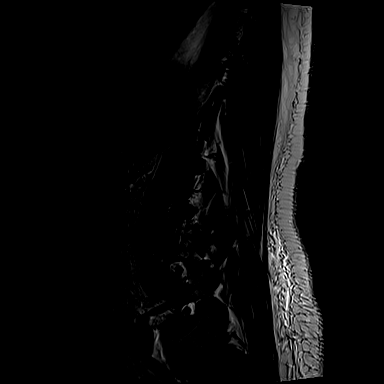
[im 6/15]
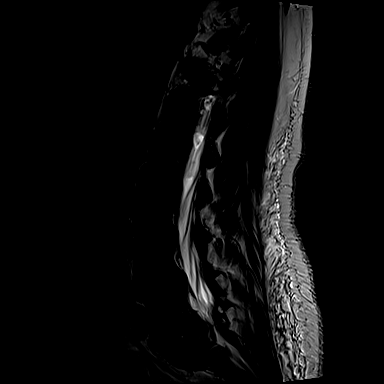
[im 9/15]
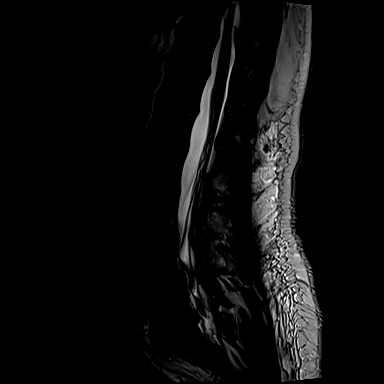
[im 12/15]
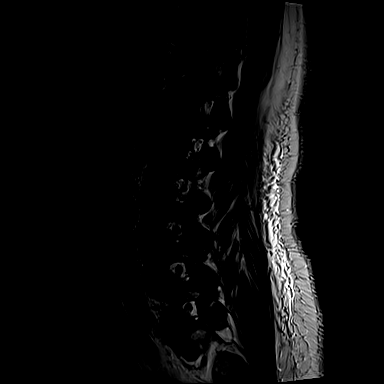
[im 15/15]
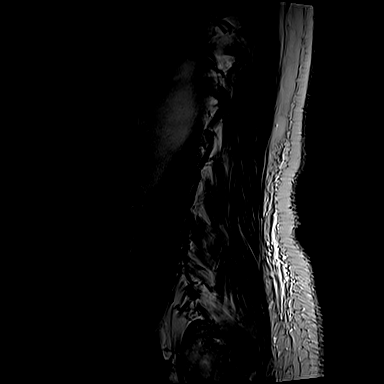

[Series 7: T1 · sagittal · 4.0mm · 0.73mm/px · 3 of 15 slices shown (1 of 2)]
[im 3/15]
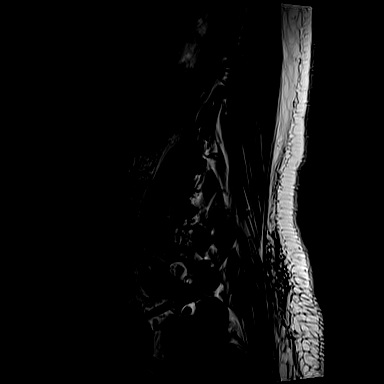
[im 9/15]
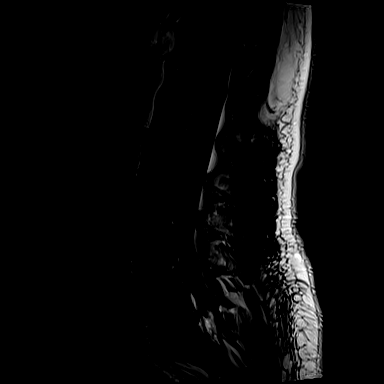
[im 15/15]
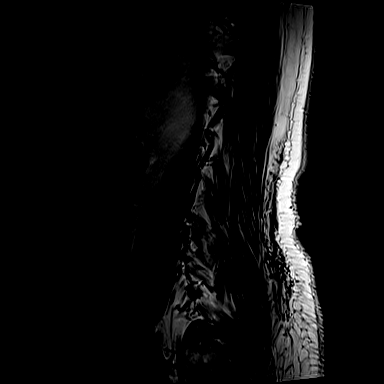

[Series 11: T1 · axial · 4.0mm · 0.28mm/px · z∈[-84,+69]mm · 3 of 40 slices shown (2 of 2)]
[im 6/40]
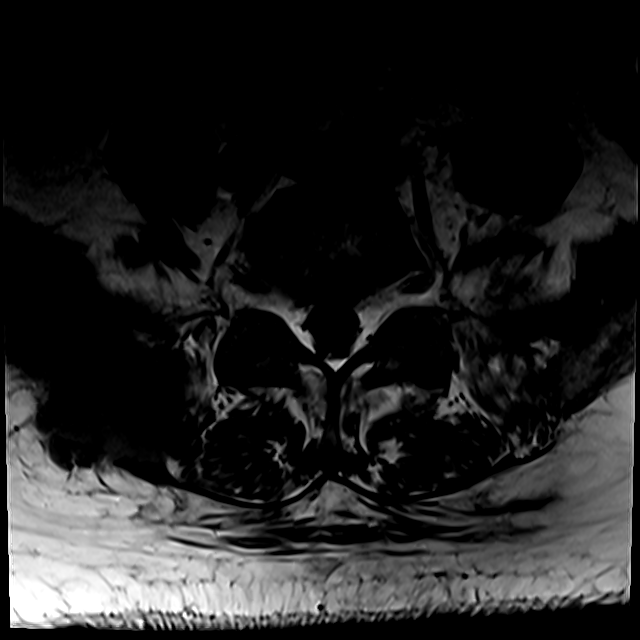
[im 20/40]
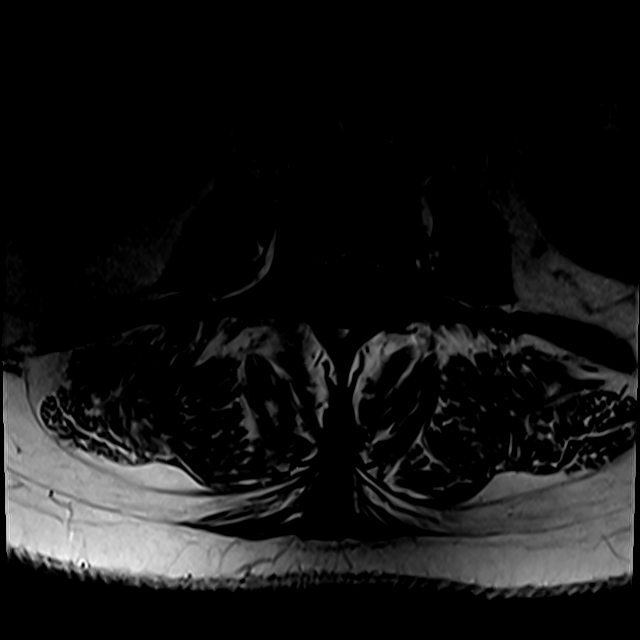
[im 34/40]
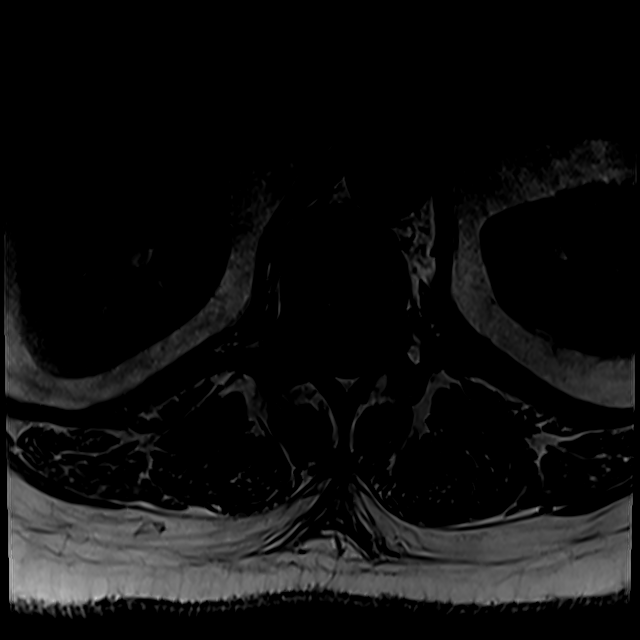

[Series 14: T2 · axial · 4.0mm · 0.28mm/px · z∈[-109,+69]mm · 6 of 40 slices shown (2 of 2)]
[im 1/40]
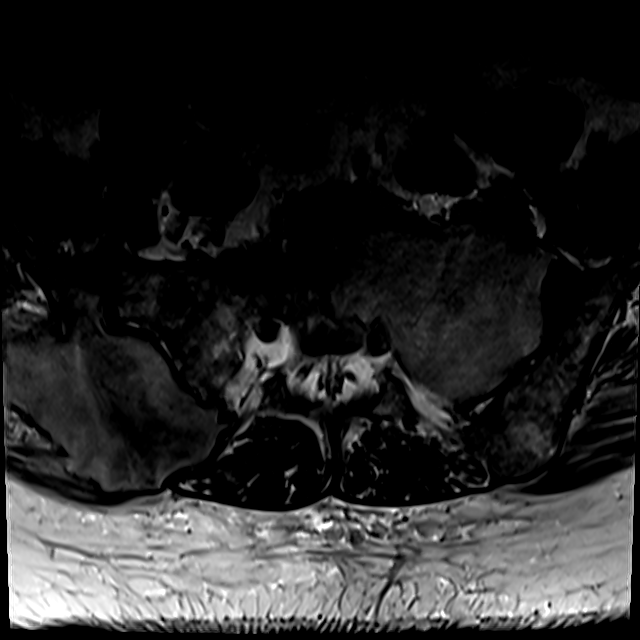
[im 6/40]
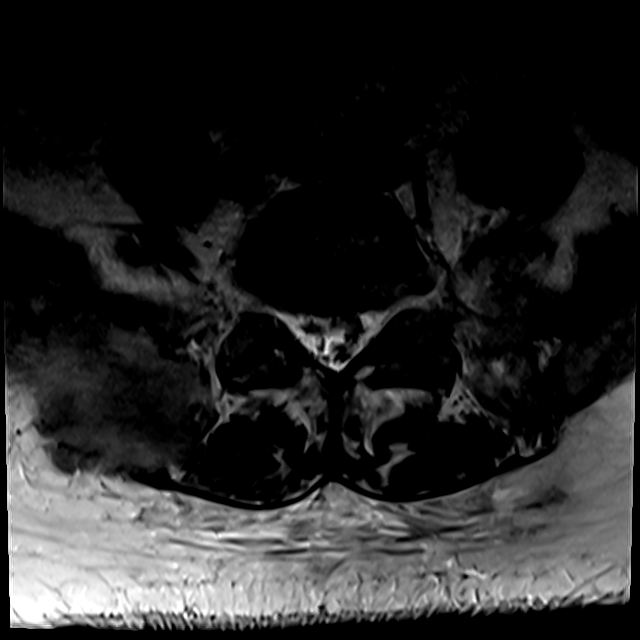
[im 12/40]
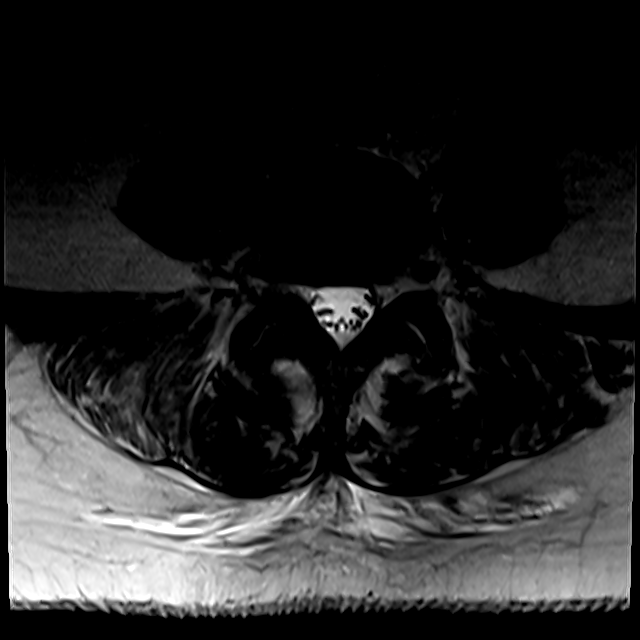
[im 17/40]
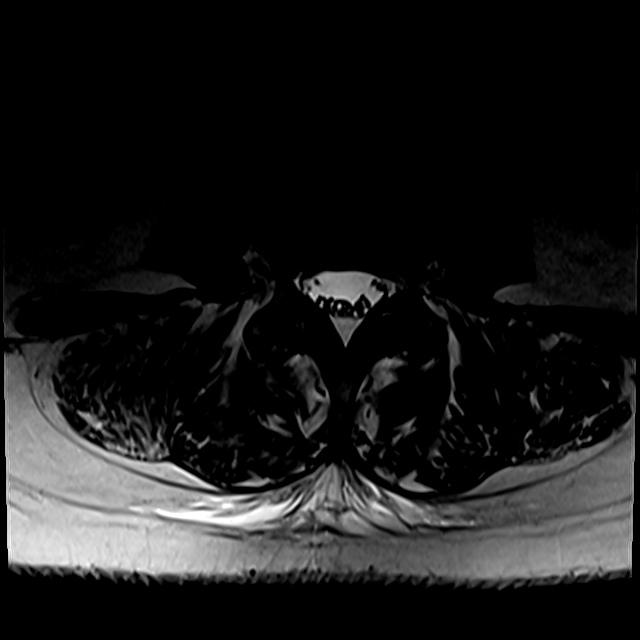
[im 20/40]
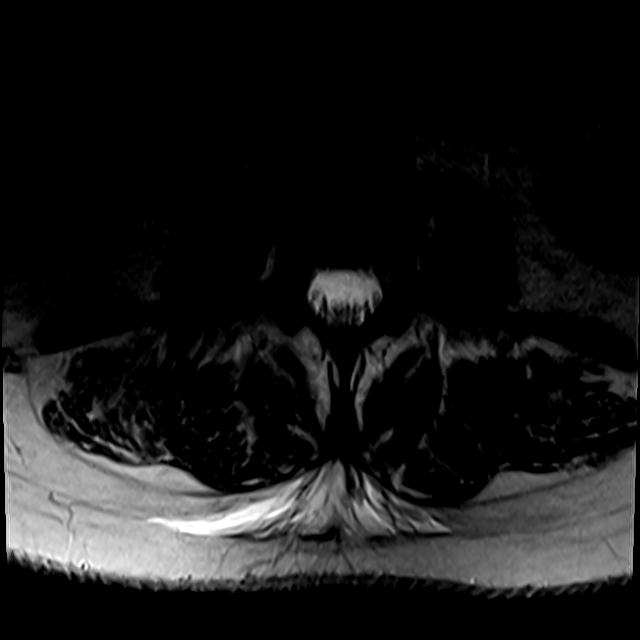
[im 34/40]
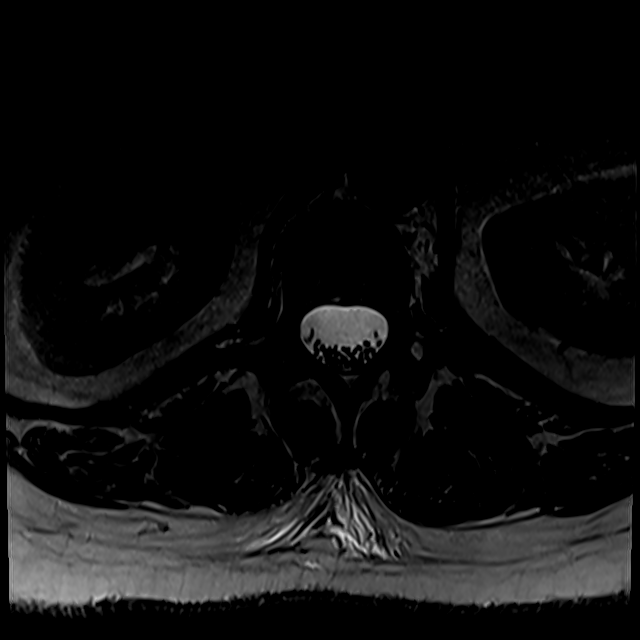

[18 of 48 positions shown; findings below may reference images not displayed]

FINDINGS: Segmentation: The inferior-most fully formed intervertebral disc is
labeled L5-S1.

Alignment:  Grade 1 anterolisthesis of L4 on L5.

Vertebrae: There are numerous T1 hypointense and STIR hyperintense
lesions throughout the lumbar spine (both vertebral bodies and
posterior elements) and the sacrum. The largest lesion in the
visualized spine involves the posterior/superior aspect of the T12
vertebral body and measures approximately 13 mm. There is an
additional 12 mm lesion involving the right T12 posterior elements.
Additional numerous lesions in the spine are largely subcentimeter.
In the sacrum, there is involvement of the right ilium, left sacral
ala, and the S1 vertebral body. There is expansile extraosseous
extension of these lesions into the right gluteal soft tissues, left
presacral soft tissues, and the partially imaged left S1-S2 neural
foramen where it contacts the partially imaged left exiting S1
nerve.

Conus medullaris and cauda equina: Conus extends to the L1 level.
Conus appears normal.

Paraspinal and other soft tissues: Extraosseous sacral extent of
myeloma is detailed above. Otherwise, unremarkable

Disc levels:

T12-L1: Tiny right paracentral disc bulge without significant canal
or foraminal stenosis.

L1-L2: No significant disc protrusion, foraminal stenosis, or canal
stenosis.

L2-L3: No significant disc protrusion, foraminal stenosis, or canal
stenosis.

L3-L4: Small right eccentric broad-based disc bulge and mild
bilateral facet hypertrophy without significant canal or foraminal
stenosis.

L4-L5: Grade 1 anterolisthesis of L4 on L5. Right eccentric
broad-based disc bulge with central annular fissure. Moderate
bilateral facet hypertrophy. No significant canal or foraminal
stenosis.

L5-S1: Mild broad-based disc bulge and moderate to severe bilateral
facet hypertrophy without significant canal or foraminal stenosis.
The exited left L5 nerve is contacted by the presacral extraosseous
extension of myeloma (see series 14, image 39).
IMPRESSION: 1. Numerous T1 hypointense and STIR hyperintense lesions throughout
the visualized spine and sacrum with multiple areas of extraosseous
extension in the sacrum, detailed above and compatible with multiple
myeloma given the clinical history.
2. An MRI of the pelvis with contrast could evaluate the full extent
of the partially imaged sacral lesions, including left S1-S2 neural
foraminal involvement and suspected involvement of the exited left
L5 nerve.
3. Multilevel degenerative change without significant canal or
foraminal stenosis in the lumbar spine.

## 2022-03-14 MED ORDER — LENALIDOMIDE 10 MG PO CAPS: ORAL_CAPSULE | ORAL | 0 refills | Status: DC

## 2022-03-14 NOTE — Telephone Encounter (Signed)
Refilled 3 days ago. Gardiner Rhyme, RN

## 2022-03-15 LAB — MULTIPLE MYELOMA PANEL, SERUM
Albumin SerPl Elph-Mcnc: 3.4 g/dL (ref 2.9–4.4)
Albumin/Glob SerPl: 1.1 (ref 0.7–1.7)
Alpha 1: 0.2 g/dL (ref 0.0–0.4)
Alpha2 Glob SerPl Elph-Mcnc: 0.6 g/dL (ref 0.4–1.0)
B-Globulin SerPl Elph-Mcnc: 0.9 g/dL (ref 0.7–1.3)
Gamma Glob SerPl Elph-Mcnc: 1.4 g/dL (ref 0.4–1.8)
Globulin, Total: 3.1 g/dL (ref 2.2–3.9)
IgA: 164 mg/dL (ref 87–352)
IgG (Immunoglobin G), Serum: 1500 mg/dL (ref 586–1602)
IgM (Immunoglobulin M), Srm: 26 mg/dL (ref 26–217)
Total Protein ELP: 6.5 g/dL (ref 6.0–8.5)

## 2022-03-22 ENCOUNTER — Encounter: Payer: Self-pay | Admitting: Hematology

## 2022-03-24 ENCOUNTER — Other Ambulatory Visit: Payer: Self-pay

## 2022-03-29 ENCOUNTER — Other Ambulatory Visit: Payer: Self-pay | Admitting: Hematology

## 2022-03-30 ENCOUNTER — Other Ambulatory Visit: Payer: Self-pay

## 2022-03-30 MED ORDER — LENALIDOMIDE 10 MG PO CAPS: ORAL_CAPSULE | ORAL | 0 refills | Status: DC

## 2022-04-05 ENCOUNTER — Other Ambulatory Visit: Payer: Self-pay

## 2022-04-06 ENCOUNTER — Ambulatory Visit: Payer: Self-pay | Admitting: Licensed Clinical Social Worker

## 2022-04-07 NOTE — Patient Outreach (Signed)
  Care Coordination   Follow Up Visit Note   04/07/2022 Name: Hannah [name] MRN: [id number] DOB: June 23, 1960  Hannah [name] is a 61 y.o. year old female who sees Tisovec, Fransico Him, MD for primary care. I spoke with  Hannah [name] by phone today.  What matters to the patients health and wellness today?  Management of MH symptoms/Stress    Goals Addressed             This Visit's Progress    Care Coordination-Management of Dep/Anx Symptoms   On track    Care Coordination Interventions: Solution-Focused Strategies employed:  Mindfulness or Relaxation training provided Active listening / Reflection utilized  Emotional Support Provided Patient reports she is doing well. Prep program through [facility] will f/up with her regarding updated schedule  Patient is working full time. She is not eligible for FMLA until mid 2024. Pt will collaborate with MD and specialists addressing any accommodations while working LCSW encouraged pt to continue advocating for self and utilize care coordination team, as needed        SDOH assessments and interventions completed:  No     Care Coordination Interventions Activated:  Yes  Care Coordination Interventions:  Yes, provided   Follow up plan: Follow up call scheduled for Jan 2024    Encounter Outcome:  Pt. Visit Completed   Christa See, MSW, [email or web address] Phone [phone] 10:53 AM

## 2022-04-07 NOTE — Patient Instructions (Signed)
Visit Information  Thank you for taking time to visit with me today. Please don't hesitate to contact me if I can be of assistance to you.   Following are the goals we discussed today:   Goals Addressed             This Visit's Progress    Care Coordination-Management of Dep/Anx Symptoms   On track    Care Coordination Interventions: Solution-Focused Strategies employed:  Mindfulness or Relaxation training provided Active listening / Reflection utilized  Emotional Support Provided Patient reports she is doing well. Prep program through [facility] will f/up with her regarding updated schedule  Patient is working full time. She is not eligible for FMLA until mid 2024. Pt will collaborate with MD and specialists addressing any accommodations while working LCSW encouraged pt to continue advocating for self and utilize care coordination team, as needed        Our next appointment is by telephone on 06/07/22 at 9 AM  Please call the care guide team at [phone] if you need to cancel or reschedule your appointment.   If you are experiencing a Mental Health or [place] or need someone to talk to, please call the Suicide and Crisis Lifeline: 988 call 911   Patient verbalizes understanding of instructions and care plan provided today and agrees to view in [place]. Active MyChart status and patient understanding of how to access instructions and care plan via MyChart confirmed with patient.     Christa See, MSW, [name] Phone [phone] 10:53 AM

## 2022-04-13 DIAGNOSIS — Z1231 Encounter for screening mammogram for malignant neoplasm of breast: Secondary | ICD-10-CM | POA: Diagnosis not present

## 2022-04-13 DIAGNOSIS — Z01411 Encounter for gynecological examination (general) (routine) with abnormal findings: Secondary | ICD-10-CM | POA: Diagnosis not present

## 2022-04-13 DIAGNOSIS — C9001 Multiple myeloma in remission: Secondary | ICD-10-CM | POA: Diagnosis not present

## 2022-04-13 DIAGNOSIS — B009 Herpesviral infection, unspecified: Secondary | ICD-10-CM | POA: Diagnosis not present

## 2022-04-13 DIAGNOSIS — Z6838 Body mass index (BMI) 38.0-38.9, adult: Secondary | ICD-10-CM | POA: Diagnosis not present

## 2022-04-25 ENCOUNTER — Other Ambulatory Visit: Payer: Self-pay | Admitting: Hematology

## 2022-04-25 MED ORDER — LENALIDOMIDE 10 MG PO CAPS: ORAL_CAPSULE | ORAL | 0 refills | Status: DC

## 2022-05-10 ENCOUNTER — Other Ambulatory Visit: Payer: Self-pay | Admitting: Hematology

## 2022-05-11 ENCOUNTER — Ambulatory Visit (INDEPENDENT_AMBULATORY_CARE_PROVIDER_SITE_OTHER): Payer: BC Managed Care – PPO | Admitting: Orthopedic Surgery

## 2022-05-11 ENCOUNTER — Ambulatory Visit (INDEPENDENT_AMBULATORY_CARE_PROVIDER_SITE_OTHER): Payer: BC Managed Care – PPO

## 2022-05-11 ENCOUNTER — Encounter: Payer: Self-pay | Admitting: Orthopedic Surgery

## 2022-05-11 ENCOUNTER — Ambulatory Visit: Payer: Self-pay

## 2022-05-11 DIAGNOSIS — M19011 Primary osteoarthritis, right shoulder: Secondary | ICD-10-CM

## 2022-05-11 DIAGNOSIS — M25511 Pain in right shoulder: Secondary | ICD-10-CM

## 2022-05-12 NOTE — Telephone Encounter (Signed)
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

## 2022-05-13 ENCOUNTER — Other Ambulatory Visit: Payer: Self-pay

## 2022-05-13 ENCOUNTER — Encounter: Payer: Self-pay | Admitting: Orthopedic Surgery

## 2022-05-13 DIAGNOSIS — M79601 Pain in right arm: Secondary | ICD-10-CM

## 2022-05-13 DIAGNOSIS — M25511 Pain in right shoulder: Secondary | ICD-10-CM

## 2022-05-13 MED ORDER — LIDOCAINE HCL 1 % IJ SOLN
5.0000 mL | INTRAMUSCULAR | Status: AC | PRN
  Administered 2022-05-11: 5 mL

## 2022-05-13 MED ORDER — BUPIVACAINE HCL 0.5 % IJ SOLN
9.0000 mL | INTRAMUSCULAR | Status: AC | PRN
  Administered 2022-05-11: 9 mL via INTRA_ARTICULAR

## 2022-05-13 MED ORDER — METHYLPREDNISOLONE ACETATE 40 MG/ML IJ SUSP
40.0000 mg | INTRAMUSCULAR | Status: AC | PRN
  Administered 2022-05-11: 40 mg via INTRA_ARTICULAR

## 2022-05-13 NOTE — Progress Notes (Signed)
Office Visit Note   Patient: Hannah [name]           Date of Birth: 1961/05/29           MRN: [id number] Visit Date: 05/11/2022 Requested by: [name], MD [address] [place],  [place] [address] PCP: Hannah [name], Fransico Him, MD  Subjective: Chief Complaint  Patient presents with   Right Shoulder - Pain    HPI: ASHTEN [name] is a 61 y.o. female who presents to the office reporting right shoulder pain.  She has been told that her shoulder was "bone-on-bone" in the past.  She reports weakness and decreased range of motion.  Taking Aleve and Tylenol for symptoms.  Hard for her to sleep at night due to pain.  Denies any history of injury.  Patient states that the pain is getting worse over time.  Hard for her to work.  She also reports some weakness in her hands.  She is in remission for multiple myeloma.  She had a stem cell transplant.  She is on chemo which is a 21-day cycle on and 7-day cycle off..                ROS: All systems reviewed are negative as they relate to the chief complaint within the history of present illness.  Patient denies fevers or chills.  Assessment & Plan: Visit Diagnoses:  1. Primary osteoarthritis of right shoulder   2. Right shoulder pain, unspecified chronicity     Plan: Impression is severe right shoulder arthritis.  She also has subluxating ulnar nerve on the right-hand side.  I think would be better to get this nerve situation addressed before placing any shoulder incision proximal to that.  Her hand is weak.  Plan EMG nerve study right upper extremity to evaluate ulnar nerve subluxation and dysfunction.  Glenohumeral joint performed today under ultrasound guidance.  Follow-up after that nerve study and we could potentially get her set up for either subcutaneous or submuscular ulnar nerve transposition.  Follow-Up Instructions: No follow-ups on file.   Orders:  Orders Placed This Encounter  Procedures   XR Shoulder Right   US Guided Needle  Placement - No Linked Charges   No orders of the defined types were placed in this encounter.     Procedures: Large Joint Inj: R glenohumeral on 05/11/2022 7:55 AM Indications: diagnostic evaluation and pain Details: 22 G 1.5 in needle, ultrasound-guided posterior approach  Arthrogram: No  Medications: 9 mL bupivacaine 0.5 %; 40 mg methylPREDNISolone acetate 40 MG/ML; 5 mL lidocaine 1 % Outcome: tolerated well, no immediate complications Procedure, treatment alternatives, risks and benefits explained, specific risks discussed. Consent was given by the patient. Immediately prior to procedure a time out was called to verify the correct patient, procedure, equipment, support staff and site/side marked as required. Patient was prepped and draped in the usual sterile fashion.       Clinical Data: No additional findings.  Objective: Vital Signs: LMP 10/22/2016 (Exact Date)   Physical Exam:  Constitutional: Patient appears well-developed HEENT:  Head: Normocephalic Eyes:EOM are normal Neck: Normal range of motion Cardiovascular: Normal rate Pulmonary/chest: Effort normal Neurologic: Patient is alert Skin: Skin is warm Psychiatric: Patient has normal mood and affect  Ortho Exam: Ortho exam demonstrates weakness to interosseous strength testing on the right compared to the left.  Ulnar nerve subluxate's in the right elbow with flexion compared to the left elbow.  EPL FPL interosseous strength intact.  On the  left.  Elbow range of motion is full on the right.  Shoulder range of motion restricted to approximately 25/80/100 approximately.  Deltoid is functional.  Rotator cuff is a little weak to external rotation on the right compared to the left but subscap strength is 5+ out of 5 bilaterally.  Specialty Comments:  No specialty comments available.  Imaging: No results found.   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   Major depression, recurrent ([place]) 01/12/2018   Chronic right shoulder pain 12/20/2017   MDD (major depressive disorder), recurrent episode, moderate ([place]) 11/04/2017   Primary osteoarthritis of both hands 09/26/2017   Primary osteoarthritis of right shoulder 09/26/2017   Primary osteoarthritis of both hips 09/26/2017   Status post total knee replacement, right 09/26/2017   Primary osteoarthritis of both feet 09/26/2017   History of asthma 09/26/2017   Primary osteoarthritis of left knee 04/18/2017   Menorrhagia 11/04/2016   Fibroid tumor 11/04/2016   Adenomyosis 11/04/2016   Hypertension, essential 11/04/2016   Total knee replacement status 01/28/2016   Past Medical History:  Diagnosis Date   Allergy    Anemia    Anxiety    Arthritis    Asthma    Depression    Hypertension     Family History  Problem Relation Age of Onset   Cancer Mother        uterine    Asthma Mother    Arthritis Daughter    Depression Maternal Uncle    Colon cancer Neg Hx     Past Surgical History:  Procedure Laterality Date   CHOLECYSTECTOMY  1993   COLONOSCOPY     TOTAL KNEE ARTHROPLASTY Right 01/28/2016   TOTAL KNEE ARTHROPLASTY Right 01/28/2016   Procedure: RIGHT TOTAL KNEE ARTHROPLASTY;  Surgeon: [name], MD;  Location: [facility];  Service: Orthopedics;  Laterality: Right;   TRIGGER FINGER RELEASE Right 06/18/2014   Procedure: RIGHT LONG FINGER TRIGGER RELEASE;  Surgeon: [name], MD;  Location: [facility];  Service: Orthopedics;  Laterality: Right;   TUBAL LIGATION     VAGINAL HYSTERECTOMY Bilateral 11/04/2016   Procedure: HYSTERECTOMY VAGINAL with Bilateral Salpingectomy;  Surgeon: [name], MD;  Location: [facility];  Service: Gynecology;  Laterality: Bilateral;   WEIL OSTEOTOMY  Right 07/07/2020   Procedure: RIGHT FOOT WEIL OSTEOTOMY 2, 3, AND 4 METATARSALS AND PROXIMAL INTERPHALANGEAL JOINT FUSION 2 & 4 TOES;  Surgeon: Newt Minion, MD;  Location: [facility];  Service: Orthopedics;  Laterality: Right;   Social History   Occupational History   Not on file  Tobacco Use   Smoking status: Never   Smokeless tobacco: Never  Vaping Use   Vaping Use: Never used  Substance and Sexual Activity   Alcohol use: No    Alcohol/week: 0.0 standard drinks of alcohol   Drug use: No   Sexual activity: Yes    Partners: Male    Birth control/protection: Surgical

## 2022-05-20 ENCOUNTER — Ambulatory Visit (INDEPENDENT_AMBULATORY_CARE_PROVIDER_SITE_OTHER): Payer: BC Managed Care – PPO | Admitting: Physical Medicine and Rehabilitation

## 2022-05-20 DIAGNOSIS — R202 Paresthesia of skin: Secondary | ICD-10-CM | POA: Diagnosis not present

## 2022-05-20 NOTE — Progress Notes (Unsigned)
Functional Pain Scale - descriptive words and definitions  Distracting (5)    Aware of pain/able to complete some ADL's but limited by pain/sleep is affected and active distractions are only slightly useful. Moderate range order  Average Pain 5  Right handed. Right arm pain that radiates to the hand, numbness, tingling. Hard to grasp and hold things with right hand

## 2022-05-21 NOTE — Procedures (Unsigned)
EMG & NCV Findings: Evaluation of the right median (across palm) sensory nerve showed prolonged distal peak latency (Wrist, 4.0 ms).  All remaining nerves (as indicated in the following tables) were within normal limits.    All examined muscles (as indicated in the following table) showed no evidence of electrical instability.    Impression: The above electrodiagnostic study is ABNORMAL and reveals evidence of a mild right median nerve entrapment at the wrist (carpal tunnel syndrome) affecting sensory components. There is no significant electrodiagnostic evidence of any other focal nerve entrapment, brachial plexopathy or cervical radiculopathy.   Recommendations: 1.  Follow-up with referring physician. 2.  Continue current management of symptoms. 3.  Continue use of resting splint at night-time and as needed during the day.  ___________________________ Wonda Olds Board Certified, American Board of Physical Medicine and Rehabilitation    Nerve Conduction Studies Anti Sensory Summary Table   Stim Site NR Peak (ms) Norm Peak (ms) P-T Amp (V) Norm P-T Amp Site1 Site2 Delta-P (ms) Dist (cm) Vel (m/s) Norm Vel (m/s)  Right Median Acr Palm Anti Sensory (2nd Digit)  30.7C  Wrist    *4.0 <3.6 16.6 >10 Wrist Palm 2.1 0.0    Palm    1.9 <2.0 11.7         Right Radial Anti Sensory (Base 1st Digit)  31.3C  Wrist    2.2 <3.1 21.6  Wrist Base 1st Digit 2.2 0.0    Right Ulnar Anti Sensory (5th Digit)  31.6C  Wrist    3.1 <3.7 25.8 >15.0 Wrist 5th Digit 3.1 14.0 45 >38   Motor Summary Table   Stim Site NR Onset (ms) Norm Onset (ms) O-P Amp (mV) Norm O-P Amp Site1 Site2 Delta-0 (ms) Dist (cm) Vel (m/s) Norm Vel (m/s)  Right Median Motor (Abd Poll Brev)  31.8C  Wrist    3.6 <4.2 5.9 >5 Elbow Wrist 4.1 23.0 56 >50  Elbow    7.7  5.2         Right Ulnar Motor (Abd Dig Min)  32.7C  Wrist    2.7 <4.2 9.8 >3 B Elbow Wrist 3.9 22.0 56 >53  B Elbow    6.6  9.0  A Elbow B Elbow 1.0 10.0  100 >53  A Elbow    7.6  9.0          EMG   Side Muscle Nerve Root Ins Act Fibs Psw Amp Dur Poly Recrt Int Fraser Din Comment  Right Abd Poll Brev Median C8-T1 Nml Nml Nml Nml Nml 0 Nml Nml   Right 1stDorInt Ulnar C8-T1 Nml Nml Nml Nml Nml 0 Nml Nml   Right PronatorTeres Median C6-7 Nml Nml Nml Nml Nml 0 Nml Nml   Right Biceps Musculocut C5-6 Nml Nml Nml Nml Nml 0 Nml Nml   Right Deltoid Axillary C5-6 Nml Nml Nml Nml Nml 0 Nml Nml     Nerve Conduction Studies Anti Sensory Left/Right Comparison   Stim Site L Lat (ms) R Lat (ms) L-R Lat (ms) L Amp (V) R Amp (V) L-R Amp (%) Site1 Site2 L Vel (m/s) R Vel (m/s) L-R Vel (m/s)  Median Acr Palm Anti Sensory (2nd Digit)  30.7C  Wrist  *4.0   16.6  Wrist Palm     Palm  1.9   11.7        Radial Anti Sensory (Base 1st Digit)  31.3C  Wrist  2.2   21.6  Wrist Base 1st Digit  Ulnar Anti Sensory (5th Digit)  31.6C  Wrist  3.1   25.8  Wrist 5th Digit  45    Motor Left/Right Comparison   Stim Site L Lat (ms) R Lat (ms) L-R Lat (ms) L Amp (mV) R Amp (mV) L-R Amp (%) Site1 Site2 L Vel (m/s) R Vel (m/s) L-R Vel (m/s)  Median Motor (Abd Poll Brev)  31.8C  Wrist  3.6   5.9  Elbow Wrist  56   Elbow  7.7   5.2        Ulnar Motor (Abd Dig Min)  32.7C  Wrist  2.7   9.8  B Elbow Wrist  56   B Elbow  6.6   9.0  A Elbow B Elbow  100   A Elbow  7.6   9.0           Waveforms:

## 2022-05-24 ENCOUNTER — Telehealth: Payer: Self-pay

## 2022-05-24 NOTE — Telephone Encounter (Signed)
Call to pt reference PREP class starting on 06/06/22 at [facility] [place] [id number].  Can do location and start date.  Intake scheduled for 06/02/22 at 11am.  Will meet her at front desk.

## 2022-05-25 ENCOUNTER — Other Ambulatory Visit: Payer: Self-pay

## 2022-05-25 ENCOUNTER — Telehealth: Payer: Self-pay | Admitting: Pharmacy Technician

## 2022-05-25 ENCOUNTER — Other Ambulatory Visit (HOSPITAL_COMMUNITY): Payer: Self-pay

## 2022-05-25 NOTE — Progress Notes (Signed)
Hannah [name] - 61 y.o. female MRN [id number]  Date of birth: 10/11/1960  Office Visit Note: Visit Date: 05/20/2022 PCP: [name], MD Referred by: Meredith Pel, MD  Subjective: Chief Complaint  Patient presents with   Right Hand - Pain, Numbness, Weakness   Right Arm - Numbness, Pain, Weakness   HPI: Hannah [name] is a 61 y.o. female who comes in today at the request of Dr. Anderson Malta for evaluation and management of the Right upper extremities.  Patient is Right hand dominant.  She has been followed with Dr. [name] for severe osteoarthritis of the glenohumeral joint.  There is potential for surgery planning.  Her case is complicated by history of multiple myeloma status post stem cell transplant and continued chemotherapy.  She reports right hand paresthesias pain numbness and tingling and weakness.  She denies any left-sided complaints.  She endorses weakness of the hand with some swelling and difficulty opening objects.  Dr. [name] noted subluxation of the ulnar nerve in the ulnar groove.  Symptoms and paresthesias somewhat global can be more ulnar but can be some radial digits as well.  No frank radicular symptoms down the arm.  No prior electrodiagnostic study.     Review of Systems  Musculoskeletal:  Positive for joint pain and neck pain.  Neurological:  Positive for tingling and weakness.  All other systems reviewed and are negative.  Otherwise per HPI.  Assessment & Plan: Visit Diagnoses:    ICD-10-CM   1. Paresthesia of skin  R20.2 NCV with EMG (electromyography)       Plan: Impression: Clinically she has mixed area of paresthesia in the hand which could be carpal tunnel syndrome and then she has the subluxation of the ulnar nerve as evidenced by Dr. Randel Pigg evaluation.  She also has severe osteoarthritis of the shoulder.  Clinically does not seem to be radicular in nature.  Given these findings we did complete electrodiagnostic study today.  The above  electrodiagnostic study is ABNORMAL and reveals evidence of a mild right median nerve entrapment at the wrist  affecting sensory components.   There is no significant electrodiagnostic evidence of any other focal nerve entrapment (specifically no ulnar neuropathy), brachial plexopathy or cervical radiculopathy.   Recommendations: 1.  Follow-up with referring physician. 2.  Continue current management of symptoms. 3.  Continue use of resting splint at night-time and as needed during the day.  Meds & Orders: No orders of the defined types were placed in this encounter.   Orders Placed This Encounter  Procedures   NCV with EMG (electromyography)    Follow-up: Return in about 2 weeks (around 06/03/2022) for Scar below.   Procedures: No procedures performed  EMG & NCV Findings: Evaluation of the right median (across palm) sensory nerve showed prolonged distal peak latency (Wrist, 4.0 ms).  All remaining nerves (as indicated in the following tables) were within normal limits.    All examined muscles (as indicated in the following table) showed no evidence of electrical instability.    Impression: The above electrodiagnostic study is ABNORMAL and reveals evidence of a mild right median nerve entrapment at the wrist  affecting sensory components.   There is no significant electrodiagnostic evidence of any other focal nerve entrapment (specifically no ulnar neuropathy), brachial plexopathy or cervical radiculopathy.   Recommendations: 1.  Follow-up with referring physician. 2.  Continue current management of symptoms. 3.  Continue use of resting splint at night-time and as needed during  the day.  ___________________________ Wonda Olds Board Certified, American Board of Physical Medicine and Rehabilitation    Nerve Conduction Studies Anti Sensory Summary Table   Stim Site NR Peak (ms) Norm Peak (ms) P-T Amp (V) Norm P-T Amp Site1 Site2 Delta-P (ms) Dist (cm) Vel (m/s) Norm Vel  (m/s)  Right Median Acr Palm Anti Sensory (2nd Digit)  30.7C  Wrist    *4.0 <3.6 16.6 >10 Wrist Palm 2.1 0.0    Palm    1.9 <2.0 11.7         Right Radial Anti Sensory (Base 1st Digit)  31.3C  Wrist    2.2 <3.1 21.6  Wrist Base 1st Digit 2.2 0.0    Right Ulnar Anti Sensory (5th Digit)  31.6C  Wrist    3.1 <3.7 25.8 >15.0 Wrist 5th Digit 3.1 14.0 45 >38   Motor Summary Table   Stim Site NR Onset (ms) Norm Onset (ms) O-P Amp (mV) Norm O-P Amp Site1 Site2 Delta-0 (ms) Dist (cm) Vel (m/s) Norm Vel (m/s)  Right Median Motor (Abd Poll Brev)  31.8C  Wrist    3.6 <4.2 5.9 >5 Elbow Wrist 4.1 23.0 56 >50  Elbow    7.7  5.2         Right Ulnar Motor (Abd Dig Min)  32.7C  Wrist    2.7 <4.2 9.8 >3 B Elbow Wrist 3.9 22.0 56 >53  B Elbow    6.6  9.0  A Elbow B Elbow 1.0 10.0 100 >53  A Elbow    7.6  9.0          EMG   Side Muscle Nerve Root Ins Act Fibs Psw Amp Dur Poly Recrt Int Fraser Din Comment  Right Abd Poll Brev Median C8-T1 _0  0 Nml Nml   Right 1stDorInt Ulnar C8-T1 _1  0 Nml Nml   Right PronatorTeres Median C6-7 _2  0 Nml Nml   Right Biceps Musculocut C5-6 _3  0 Nml Nml   Right Deltoid Axillary C5-6 _4  0 Nml Nml     Nerve Conduction Studies Anti Sensory Left/Right Comparison   Stim Site L Lat (ms) R Lat (ms) L-R Lat (ms) L Amp (V) R Amp (V) L-R Amp (%) Site1 Site2 L Vel (m/s) R Vel (m/s) L-R Vel (m/s)  Median Acr Palm Anti Sensory (2nd Digit)  30.7C  Wrist  *4.0   16.6  Wrist Palm     Palm  1.9   11.7        Radial Anti Sensory (Base 1st Digit)  31.3C  Wrist  2.2   21.6  Wrist Base 1st Digit     Ulnar Anti Sensory (5th Digit)  31.6C  Wrist  3.1   25.8  Wrist 5th Digit  45    Motor Left/Right Comparison   Stim Site L Lat (ms) R Lat (ms) L-R Lat (ms) L Amp (mV) R Amp (mV) L-R Amp (%) Site1 Site2 L Vel (m/s) R Vel (m/s) L-R Vel (m/s)  Median Motor (Abd Poll Brev)  31.8C  Wrist  3.6   5.9  Elbow  Wrist  56   Elbow  7.7   5.2        Ulnar Motor (Abd Dig Min)  32.7C  Wrist  2.7   9.8  B Elbow Wrist  56   B Elbow  6.6   9.0  A Elbow  B Elbow  100   A Elbow  7.6   9.0           Waveforms:             Clinical History: No specialty comments available.   She reports that she has never smoked. She has never used smokeless tobacco. No results for input(s): "HGBA1C", "LABURIC" in the last 8760 hours.  Objective:  VS:  HT:    WT:   BMI:     BP:   HR: bpm  TEMP: ( )  RESP:  Physical Exam Vitals and nursing note reviewed.  Constitutional:      General: She is not in acute distress.    Appearance: Normal appearance. She is well-developed. She is not ill-appearing.  HENT:     Head: Normocephalic and atraumatic.  Eyes:     Conjunctiva/sclera: Conjunctivae normal.     Pupils: Pupils are equal, round, and reactive to light.  Cardiovascular:     Rate and Rhythm: Normal rate.     Pulses: Normal pulses.  Pulmonary:     Effort: Pulmonary effort is normal.  Musculoskeletal:        General: No swelling, tenderness or deformity.     Right lower leg: No edema.     Left lower leg: No edema.     Comments: Inspection reveals no atrophy of the bilateral APB or FDI or hand intrinsics. There is no swelling, color changes, allodynia or dystrophic changes. There is 5 out of 5 strength in the bilateral wrist extension, finger abduction and long finger flexion. There is intact sensation to light touch in all dermatomal and peripheral nerve distributions. There is a negative Froment's test bilaterally. There is a negative Phalen's test bilaterally. There is a negative Hoffmann's test bilaterally.  Skin:    General: Skin is warm and dry.     Findings: No erythema or rash.  Neurological:     General: No focal deficit present.     Mental Status: She is alert and oriented to person, place, and time.     Sensory: No sensory deficit.     Motor: No weakness or abnormal muscle tone.      Coordination: Coordination normal.     Gait: Gait normal.  Psychiatric:        Mood and Affect: Mood normal.        Behavior: Behavior normal.     Ortho Exam  Imaging: No results found.  Past Medical/Family/Surgical/Social History: Medications & Allergies reviewed per EMR, new medications updated. 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   Major depression, recurrent ([place]) 01/12/2018   Chronic right shoulder pain 12/20/2017   MDD (major depressive disorder), recurrent episode, moderate ([place]) 11/04/2017   Primary osteoarthritis of both hands 09/26/2017   Primary osteoarthritis of right shoulder 09/26/2017   Primary osteoarthritis of both hips 09/26/2017   Status post total knee replacement, right 09/26/2017   Primary osteoarthritis of both feet 09/26/2017   History of asthma 09/26/2017   Primary osteoarthritis of left knee 04/18/2017   Menorrhagia 11/04/2016   Fibroid tumor 11/04/2016   Adenomyosis 11/04/2016   Hypertension, essential 11/04/2016   Total knee replacement status 01/28/2016   Past Medical History:  Diagnosis Date   Allergy  Anemia    Anxiety    Arthritis    Asthma    Depression    Hypertension    Family History  Problem Relation Age of Onset   Cancer Mother        uterine    Asthma Mother    Arthritis Daughter    Depression Maternal Uncle    Colon cancer Neg Hx    Past Surgical History:  Procedure Laterality Date   CHOLECYSTECTOMY  1993   COLONOSCOPY     TOTAL KNEE ARTHROPLASTY Right 01/28/2016   TOTAL KNEE ARTHROPLASTY Right 01/28/2016   Procedure: RIGHT TOTAL KNEE ARTHROPLASTY;  Surgeon: [name], MD;  Location: [facility];  Service: Orthopedics;  Laterality: Right;   TRIGGER FINGER RELEASE Right 06/18/2014   Procedure:  RIGHT LONG FINGER TRIGGER RELEASE;  Surgeon: [name], MD;  Location: [facility];  Service: Orthopedics;  Laterality: Right;   TUBAL LIGATION     VAGINAL HYSTERECTOMY Bilateral 11/04/2016   Procedure: HYSTERECTOMY VAGINAL with Bilateral Salpingectomy;  Surgeon: [name], MD;  Location: [facility];  Service: Gynecology;  Laterality: Bilateral;   WEIL OSTEOTOMY Right 07/07/2020   Procedure: RIGHT FOOT WEIL OSTEOTOMY 2, 3, AND 4 METATARSALS AND PROXIMAL INTERPHALANGEAL JOINT FUSION 2 & 4 TOES;  Surgeon: Newt Minion, MD;  Location: [facility];  Service: Orthopedics;  Laterality: Right;   Social History   Occupational History   Not on file  Tobacco Use   Smoking status: Never   Smokeless tobacco: Never  Vaping Use   Vaping Use: Never used  Substance and Sexual Activity   Alcohol use: No    Alcohol/week: 0.0 standard drinks of alcohol   Drug use: No   Sexual activity: Yes    Partners: Male    Birth control/protection: Surgical

## 2022-05-25 NOTE — Telephone Encounter (Signed)
Oral Oncology Patient Advocate Encounter   Received notification that prior authorization for Lenalidomide is due for renewal.   PA submitted on 05/25/22 Key [id number] Status is approved.  Effective dates 05/25/22 through 05/25/23  Patient must continue to fill at [facility] specialty.     Lady Deutscher, CPhT-Adv Oncology Pharmacy Patient [place] Direct Number: [phone]  Fax: [phone]

## 2022-05-26 ENCOUNTER — Other Ambulatory Visit: Payer: Self-pay

## 2022-05-26 MED ORDER — LENALIDOMIDE 10 MG PO CAPS: ORAL_CAPSULE | ORAL | 0 refills | Status: DC

## 2022-05-31 ENCOUNTER — Telehealth: Payer: Self-pay

## 2022-05-31 NOTE — Telephone Encounter (Signed)
Call to pt to change intake appt to 06/09/22 at 11am and PREP class start to 06/13/22.

## 2022-06-01 ENCOUNTER — Telehealth: Payer: Self-pay

## 2022-06-01 NOTE — Telephone Encounter (Signed)
Attempted to notify Patient of completion of Request for Accommodation Form for work. Unable to reach Patient at this time. Left voicemail regarding completion of form and need for signed Release of Information Form. Release of Information Form sent via e-mail to e-mail on file with request for return with signature and date.

## 2022-06-07 ENCOUNTER — Ambulatory Visit: Payer: Self-pay | Admitting: Licensed Clinical Social Worker

## 2022-06-07 ENCOUNTER — Other Ambulatory Visit: Payer: Self-pay | Admitting: Hematology

## 2022-06-07 NOTE — Patient Instructions (Signed)
Visit Information  Thank you for taking time to visit with me today. Please don't hesitate to contact me if I can be of assistance to you.   Following are the goals we discussed today:   Goals Addressed             This Visit's Progress    Care Coordination-Management of Dep/Anx Symptoms   On track    Care Coordination Interventions: Solution-Focused Strategies employed:  Mindfulness or Relaxation training provided Active listening / Reflection utilized  Emotional Support Provided Patient reports she is doing well. Prep program through [facility] will f/up with her regarding updated schedule  Patient is working full time. She is not eligible for FMLA until mid 2024. Pt will collaborate with MD and specialists addressing any accommodations while working LCSW encouraged pt to continue advocating for self and utilize care coordination team, as needed        Our next appointment is by telephone on 06/21/22 at 9:30AM  Please call the care guide team at [phone] if you need to cancel or reschedule your appointment.   If you are experiencing a Mental Health or [place] or need someone to talk to, please call the Suicide and Crisis Lifeline: 988 call 911   Patient verbalizes understanding of instructions and care plan provided today and agrees to view in [place]. Active MyChart status and patient understanding of how to access instructions and care plan via MyChart confirmed with patient.     Christa See, MSW, [email or web address] Phone [phone] 9:47 AM

## 2022-06-07 NOTE — Patient Outreach (Signed)
  Care Coordination   Follow Up Visit Note   06/07/2022 Name: Hannah [name] MRN: [id number] DOB: [birth year]  ALAIRA [name] is a 62 y.o. year old female who sees Tisovec, Fransico Him, MD for primary care. I spoke with  Hannah [name] by phone today.  What matters to the patients health and wellness today?  Symptom Management   Goals Addressed             This Visit's Progress    Care Coordination-Management of Dep/Anx Symptoms   On track    Care Coordination Interventions: Solution-Focused Strategies employed:  Mindfulness or Relaxation training provided Active listening / Reflection utilized  Emotional Support Provided Patient reports she is doing well. Prep program through [facility] will f/up with her regarding updated schedule  Patient is working full time. She is not eligible for FMLA until mid 2024. Pt will collaborate with MD and specialists addressing any accommodations while working LCSW encouraged pt to continue advocating for self and utilize care coordination team, as needed        SDOH assessments and interventions completed:  No     Care Coordination Interventions:  Yes, provided   Follow up plan: Follow up call scheduled for 2 weeks    Encounter Outcome:  Pt. Visit Completed   Hannah [name], MSW, [name] Phone [phone] 9:46 AM

## 2022-06-08 ENCOUNTER — Other Ambulatory Visit: Payer: Self-pay | Admitting: Hematology

## 2022-06-09 ENCOUNTER — Other Ambulatory Visit: Payer: Self-pay

## 2022-06-09 DIAGNOSIS — C9000 Multiple myeloma not having achieved remission: Secondary | ICD-10-CM

## 2022-06-09 DIAGNOSIS — C9001 Multiple myeloma in remission: Secondary | ICD-10-CM

## 2022-06-09 DIAGNOSIS — D509 Iron deficiency anemia, unspecified: Secondary | ICD-10-CM

## 2022-06-09 NOTE — Progress Notes (Signed)
[facility] PREP Evaluation  Patient Details  Name: Hannah [name] MRN: [id number] Date of Birth: 01/14/1961 Age: 62 y.o. PCP: [name], MD  Vitals:   06/09/22 1210  BP: 138/78  Pulse: 89  SpO2: 98%  Weight: 218 lb (98.9 kg)     [facility] Eval - 06/09/22 1200       [facility] "PREP" Location   [facility] "PREP" Location [facility]      Referral    Referring Provider [facility]    Reason for referral Inactivity;Obesitity/Overweight    Program Start Date 06/13/22   MW [id number] x 12 wks     Measurement   Waist Circumference 44 inches    Hip Circumference 46.5 inches    Body fat 43.2 percent      Information for Trainer   Goals Gain stamina, stronger, pain relief    Current Exercise none    Orthopedic Concerns arthritis(most joints), s/p R TKR, neuropathy    Pertinent Medical History Multiple myeloma, neuropathy    Current Barriers none      Timed Up and Go (TUGS)   Timed Up and Go Low risk <9 seconds      Mobility and Daily Activities   I find it easy to walk up or down two or more flights of stairs. 1    I have no trouble taking out the trash. 1    I do housework such as vacuuming and dusting on my own without difficulty. 1    I can easily lift a gallon of milk (8lbs). 1    I can easily walk a mile. 1    I have no trouble reaching into high cupboards or reaching down to pick up something from the floor. 1    I do not have trouble doing out-door work such as Armed forces logistics/support/administrative officer, raking leaves, or gardening. 1      Mobility and Daily Activities   I feel younger than my age. 2    I feel independent. 2    I feel energetic. 2    I live an active life.  1    I feel strong. 1    I feel healthy. 1    I feel active as other people my age. 1      How fit and strong are you.   Fit and Strong Total Score 17            Past Medical History:  Diagnosis Date   Allergy    Anemia    Anxiety    Arthritis    Asthma    Depression    Hypertension    Past Surgical History:   Procedure Laterality Date   CHOLECYSTECTOMY  1993   COLONOSCOPY     TOTAL KNEE ARTHROPLASTY Right 01/28/2016   TOTAL KNEE ARTHROPLASTY Right 01/28/2016   Procedure: RIGHT TOTAL KNEE ARTHROPLASTY;  Surgeon: [name], MD;  Location: [place];  Service: Orthopedics;  Laterality: Right;   TRIGGER FINGER RELEASE Right 06/18/2014   Procedure: RIGHT LONG FINGER TRIGGER RELEASE;  Surgeon: [name], MD;  Location: [place];  Service: Orthopedics;  Laterality: Right;   TUBAL LIGATION     VAGINAL HYSTERECTOMY Bilateral 11/04/2016   Procedure: HYSTERECTOMY VAGINAL with Bilateral Salpingectomy;  Surgeon: [name], MD;  Location: [facility];  Service: Gynecology;  Laterality: Bilateral;   WEIL OSTEOTOMY Right 07/07/2020   Procedure: RIGHT FOOT WEIL OSTEOTOMY 2, 3, AND 4 METATARSALS AND PROXIMAL INTERPHALANGEAL JOINT  FUSION 2 & 4 TOES;  Surgeon: Newt Minion, MD;  Location: [place];  Service: Orthopedics;  Laterality: Right;   Social History   Tobacco Use  Smoking Status Never  Smokeless Tobacco Never    Barnett Hatter 06/09/2022, 12:15 PM

## 2022-06-09 NOTE — Progress Notes (Signed)
[place]   [phone] [phone]   Clinic Follow up Note   Patient Care Team: Tisovec, Fransico Him, MD as PCP - General (Internal Medicine) Bo Merino, MD as Consulting Physician (Rheumatology) Truitt Merle, MD as Consulting Physician (Hematology) Waymon Amato, MD as Consulting Physician (Obstetrics and Gynecology) Daneen Schick as [place] Management Rebekah [place], [employer] as Social Worker (Licensed Clinical Social Worker)  Date of Service:  06/10/2022  CHIEF COMPLAINT: f/u of IgA kappa multiple myeloma   CURRENT THERAPY:  -Maintenance Revlimid 10 mg, days 1-21 q. 28 days started 05/03/21 -Zometa q76month, restarted 05/28/21   ASSESSMENT:  Hannah DUBASis a 62y.o. 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   -bone marrow biopsy on 07/30/20 showed slightly hypercellular marrow for age and 31% plasma cells. Cytogenetics and FISH were unremarkable -she received VRD (weekly Velcade and dexamethasone, Revlimid 2 weeks on 1 week off) 08/17/20 - June 2022. She responded well. -She underwent high-dose [facility] with melphalan [facility] Dr. LAris Lot  White cells engrafted 01/03/21 and platelets 01/13/21, last platelet transfusion 01/02/21 -posttreatment PET 04/06/21 showed NED  -posttreatment bone  marrow biopsy 04/29/21 showed 10-20% hypocellular marrow with myeloid hypoplasia and 3% plasma cells -s/p prophylactic valacyclovir for 1 year and dapsone for 6 months posttransplant -she will continue revaccination per [facility] protocol -she began maintenance Revlimid 10 mg daily days 1-21 q. 28 days on 05/03/21. She is tolerating well with no noticeable side effects.  -Her last MM panel showed negative M-protein, but slightly increase Kappa light chain level.   Hemoglobin C trait (HCC) -Hemoglobin electrophoresis showed increase in Hg C, consistent with Hg C trait.  She is aware her children are at risk of hemoglobin C trait      PLAN: Lab reviewed, some are pending -proceed with Zometa today -Discontinue Zometa after the next infusion. Its been two years -lab, f/u and zometa (last dose) in 3 moths     SUMMARY OF ONCOLOGIC HISTORY: Oncology History Overview Note  Cancer Staging Multiple myeloma ([place] Staging form: Plasma Cell Myeloma and Plasma Cell Disorders, AJCC 8th Edition - Clinical stage from 07/20/2020: Beta-2-microglobulin (mg/L): 2.4, Albumin (g/dL): 3.2, ISS: Stage II, High-risk cytogenetics: Unknown, LDH: Normal - Signed by FTruitt Merle MD on 08/02/2020 Beta 2 microglobulin range (mg/L): Less than 3.5 Albumin range (g/dL): Less than 3.5 Cytogenetics: Unknown    Multiple myeloma in remission ([place]  07/03/2020 Imaging   MRI Lumbar Spine  IMPRESSION: 1. Numerous T1 hypointense and STIR hyperintense lesions throughout the visualized spine and sacrum with multiple areas of extraosseous extension in the sacrum, detailed above and compatible with multiple myeloma given the clinical history. 2. An MRI of the pelvis with contrast could evaluate the full extent of the partially imaged sacral lesions, including left S1-S2 neural foraminal involvement and suspected involvement of the exited left L5 nerve. 3. Multilevel degenerative  change without significant canal or foraminal  stenosis in the lumbar spine.   07/13/2020 Initial Diagnosis   Multiple myeloma ([place])   07/20/2020 Cancer Staging   Staging form: Plasma Cell Myeloma and Plasma Cell Disorders, AJCC 8th Edition - Clinical stage from 07/20/2020: Beta-2-microglobulin (mg/L): 2.4, Albumin (g/dL): 3.2, ISS: Stage II, High-risk cytogenetics: Unknown, LDH: Normal - Signed by Truitt Merle, MD on 03/09/2022 Stage prefix: Initial diagnosis Beta 2 microglobulin range (mg/L): Less than 3.5 Albumin range (g/dL): Less than 3.5 Cytogenetics: No abnormalities Bone disease on imaging: Present   07/24/2020 PET scan   IMPRESSION: 1. Moderate to high hypermetabolic activity associated with the expansile soft tissue masses within the LEFT and RIGHT pelvic bones is consistent with active multiple myeloma / plasmacytoma. 2. Lytic lesion in the LEFT scapula with mild metabolic activity is indeterminate. 3. Metabolic activity associated with the RIGHT knee prosthetic and RIGHT distal foot fusion is favored post procedural inflammation.     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  The findings are most consistent with  persistent/recurrent/previously known plasma cell neoplasm.  For  completeness, immunohistochemical stain for CD138 and in situ  hybridization for kappa and lambda will be performed and the results  reported in an addendum.  Correlation with cytogenetic and FISH studies  is  recommended.   08/03/2020 -  Chemotherapy   Zometa q4weeks starting 08/03/20    08/17/2020 - 11/29/2020 Chemotherapy   Velcade weekly, Revlimid, dex '20mg'$  weekly (VRd)  Starting 08/17/20. Last dose Velcade 11/23/20 and last dose revlimid on 11/29/20       INTERVAL HISTORY:  Hannah [name] is here for a follow up of IgA kappa multiple myeloma  She was last seen by me on 03/09/2022 She presents to the clinic alone. Pt states he was sick with her asthma around the holidays. PCP prescribe her prednisone.Pt states she is feeling a lot better. Pt reports that she started having hip pain in the right hip. She states she started eating bananas. Her pain starts when she sits for a long period of time that's when the pain starts. Pt states its not a constant pain. Pt goes to the "[facility]" Pt reports when she brush her teeth she has a nodule that is sensitive when she brush her teeth, its located on the gum. Pt asked when she is on her of wekk des her body do likea flush, meaning she has diarrhea at least 4 times a day but only on her off week.     All other systems were reviewed with the patient and are negative.  MEDICAL HISTORY:  Past Medical History:  Diagnosis Date   Allergy    Anemia    Anxiety    Arthritis    Asthma    Depression    Hypertension     SURGICAL HISTORY: Past Surgical History:  Procedure Laterality Date   CHOLECYSTECTOMY  1993   COLONOSCOPY     TOTAL KNEE ARTHROPLASTY Right 01/28/2016  TOTAL KNEE ARTHROPLASTY Right 01/28/2016   Procedure: RIGHT TOTAL KNEE ARTHROPLASTY;  Surgeon: [name], MD;  Location: [place];  Service: Orthopedics;  Laterality: Right;   TRIGGER FINGER RELEASE Right 06/18/2014   Procedure: RIGHT LONG FINGER TRIGGER RELEASE;  Surgeon: [name], MD;  Location: [place];  Service: Orthopedics;  Laterality: Right;   TUBAL LIGATION     VAGINAL HYSTERECTOMY Bilateral 11/04/2016   Procedure: HYSTERECTOMY VAGINAL with Bilateral Salpingectomy;   Surgeon: [name], MD;  Location: [facility];  Service: Gynecology;  Laterality: Bilateral;   WEIL OSTEOTOMY Right 07/07/2020   Procedure: RIGHT FOOT WEIL OSTEOTOMY 2, 3, AND 4 METATARSALS AND PROXIMAL INTERPHALANGEAL JOINT FUSION 2 & 4 TOES;  Surgeon: Newt Minion, MD;  Location: [place];  Service: Orthopedics;  Laterality: Right;    I have reviewed the social history and family history with the patient and they are unchanged from previous note.  ALLERGIES:  is allergic to elemental sulfur and sulfa antibiotics.  MEDICATIONS:  Current Outpatient Medications  Medication Sig Dispense Refill   aspirin EC 81 MG tablet Take 81 mg by mouth daily. Swallow whole.     calcium carbonate (OSCAL) 1500 (600 Ca) MG TABS tablet Take 600 mg of elemental calcium by mouth daily.     Cholecalciferol (VITAMIN D3) 1.25 MG (50000 UT) CAPS Take 1 capsule by mouth once a week.     ferrous sulfate 325 (65 FE) MG tablet Take 325 mg by mouth 2 (two) times daily with a meal.     folic acid (FOLVITE) 1 MG tablet folic acid 1 mg tablet  TAKE 1 TABLET BY MOUTH EVERY DAY     lenalidomide (REVLIMID) 10 MG capsule TAKE 1 CAPSULE BY MOUTH 1 TIME A DAY FOR 14 DAYS ON THEN 7 DAYS OFF [employer] authorization#[id number] Effective:06/08/2022 14 capsule 0   naproxen sodium (ALEVE) 220 MG tablet as needed.     valACYclovir (VALTREX) 500 MG tablet Take 500 mg by mouth 2 (two) times daily.     vitamin C (ASCORBIC ACID) 500 MG tablet      No current facility-administered medications for this visit.    PHYSICAL EXAMINATION: ECOG PERFORMANCE STATUS: 1 - Symptomatic but completely ambulatory  Vitals:   06/10/22 1017  BP: (!) 145/95  Pulse: (!) 102  Resp: 17  Temp: 99 F (37.2 C)  SpO2: 100%   Wt Readings from Last 3 Encounters:  06/10/22 219 lb 8 oz (99.6 kg)  06/09/22 218 lb (98.9 kg)  03/09/22 218 lb 4.8 oz (99 kg)     GENERAL:alert, no distress and comfortable SKIN: skin color normal, no rashes  or significant lesions EYES: normal, Conjunctiva are pink and non-injected, sclera clear  NEURO: alert & oriented x 3 with fluent speech LABORATORY DATA:  I have reviewed the data as listed    Latest Ref Rng & Units 06/10/2022    9:54 AM 03/09/2022    8:10 AM 12/28/2021    2:39 PM  CBC  WBC 4.0 - 10.5 K/uL 6.5  6.3  5.2   Hemoglobin 12.0 - 15.0 g/dL 11.7  11.5  10.8   Hematocrit 36.0 - 46.0 % 35.2  35.0  32.2   Platelets 150 - 400 K/uL 155  165  172         Latest Ref Rng & Units 06/10/2022    9:54 AM 03/09/2022    8:10 AM 12/28/2021    2:39 PM  CMP  Glucose 70 - 99 mg/dL 90  102  91   BUN 6 - 20 mg/dL '11  12  13   '$ Creatinine 0.44 - 1.00 mg/dL 0.73  0.82  0.84   Sodium 135 - 145 mmol/L 141  141  141   Potassium 3.5 - 5.1 mmol/L 3.9  3.6  3.6   Chloride 98 - 111 mmol/L 109  106  106   CO2 22 - 32 mmol/L '28  29  30   '$ Calcium 8.9 - 10.3 mg/dL 8.8  8.5  8.8   Total Protein 6.5 - 8.1 g/dL 6.8  7.1  7.1   Total Bilirubin 0.3 - 1.2 mg/dL 0.3  0.5  0.4   Alkaline Phos 38 - 126 U/L 87  71  67   AST 15 - 41 U/L '11  12  14   '$ ALT 0 - 44 U/L '11  9  9       '$ RADIOGRAPHIC STUDIES: I have personally reviewed the radiological images as listed and agreed with the findings in the report. No results found.    No orders of the defined types were placed in this encounter.  All questions were answered. The patient knows to call the clinic with any problems, questions or concerns. No barriers to learning was detected. The total time spent in the appointment was 30 minutes.     Truitt Merle, MD 06/10/2022   Felicity Coyer, CMA, am acting as scribe for Truitt Merle, MD.   I have reviewed the above documentation for accuracy and completeness, and I agree with the above.

## 2022-06-09 NOTE — Assessment & Plan Note (Signed)
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  She is aware her children are at risk of hemoglobin C trait

## 2022-06-09 NOTE — Assessment & Plan Note (Addendum)
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   -bone marrow biopsy on 07/30/20 showed slightly hypercellular marrow for age and 31% plasma cells. Cytogenetics and FISH were unremarkable -she received VRD (weekly Velcade and dexamethasone, Revlimid 2 weeks on 1 week off) 08/17/20 - June 2022. She responded well. -She underwent high-dose chemo with melphalan and autologous stem cell [facility] at [facility] 12/23/20 by Dr. Aris Lot.  White cells engrafted 01/03/21 and platelets 01/13/21, last platelet transfusion 01/02/21 -posttreatment PET 04/06/21 showed NED  -posttreatment bone marrow biopsy 04/29/21 showed 10-20% hypocellular marrow with myeloid hypoplasia and 3% plasma cells -s/p prophylactic valacyclovir for 1 year and dapsone for 6 months posttransplant -she will continue revaccination per [facility] protocol -she began maintenance Revlimid 10 mg daily days 1-21 q. 28 days on 05/03/21. She is tolerating well with no noticeable side effects.  -Her last MM panel showed negative M-protein, but slightly increase Kappa light chain level.

## 2022-06-10 ENCOUNTER — Inpatient Hospital Stay: Payer: BC Managed Care – PPO

## 2022-06-10 ENCOUNTER — Encounter: Payer: Self-pay | Admitting: Hematology

## 2022-06-10 ENCOUNTER — Other Ambulatory Visit: Payer: BC Managed Care – PPO

## 2022-06-10 ENCOUNTER — Inpatient Hospital Stay (HOSPITAL_BASED_OUTPATIENT_CLINIC_OR_DEPARTMENT_OTHER): Payer: BC Managed Care – PPO | Admitting: Hematology

## 2022-06-10 ENCOUNTER — Inpatient Hospital Stay: Payer: BC Managed Care – PPO | Attending: Hematology

## 2022-06-10 ENCOUNTER — Ambulatory Visit: Payer: BC Managed Care – PPO | Admitting: Hematology

## 2022-06-10 ENCOUNTER — Ambulatory Visit: Payer: BC Managed Care – PPO

## 2022-06-10 VITALS — BP 145/95 | HR 102 | Temp 99.0°F | Resp 17 | Wt 219.5 lb

## 2022-06-10 DIAGNOSIS — Z9049 Acquired absence of other specified parts of digestive tract: Secondary | ICD-10-CM | POA: Diagnosis not present

## 2022-06-10 DIAGNOSIS — Z7961 Long term (current) use of immunomodulator: Secondary | ICD-10-CM | POA: Insufficient documentation

## 2022-06-10 DIAGNOSIS — I10 Essential (primary) hypertension: Secondary | ICD-10-CM | POA: Insufficient documentation

## 2022-06-10 DIAGNOSIS — Z882 Allergy status to sulfonamides status: Secondary | ICD-10-CM | POA: Insufficient documentation

## 2022-06-10 DIAGNOSIS — C9001 Multiple myeloma in remission: Secondary | ICD-10-CM

## 2022-06-10 DIAGNOSIS — Z9484 Stem cells transplant status: Secondary | ICD-10-CM | POA: Insufficient documentation

## 2022-06-10 DIAGNOSIS — F419 Anxiety disorder, unspecified: Secondary | ICD-10-CM | POA: Diagnosis not present

## 2022-06-10 DIAGNOSIS — D582 Other hemoglobinopathies: Secondary | ICD-10-CM | POA: Diagnosis not present

## 2022-06-10 DIAGNOSIS — E669 Obesity, unspecified: Secondary | ICD-10-CM | POA: Diagnosis not present

## 2022-06-10 DIAGNOSIS — F32A Depression, unspecified: Secondary | ICD-10-CM | POA: Insufficient documentation

## 2022-06-10 DIAGNOSIS — M1990 Unspecified osteoarthritis, unspecified site: Secondary | ICD-10-CM | POA: Diagnosis not present

## 2022-06-10 DIAGNOSIS — C9000 Multiple myeloma not having achieved remission: Secondary | ICD-10-CM | POA: Insufficient documentation

## 2022-06-10 DIAGNOSIS — Z79899 Other long term (current) drug therapy: Secondary | ICD-10-CM | POA: Diagnosis not present

## 2022-06-10 DIAGNOSIS — D509 Iron deficiency anemia, unspecified: Secondary | ICD-10-CM | POA: Diagnosis not present

## 2022-06-10 DIAGNOSIS — Z9079 Acquired absence of other genital organ(s): Secondary | ICD-10-CM | POA: Insufficient documentation

## 2022-06-10 LAB — CBC WITH DIFFERENTIAL (CANCER CENTER ONLY)
Abs Immature Granulocytes: 0.02 10*3/uL (ref 0.00–0.07)
Basophils Absolute: 0 10*3/uL (ref 0.0–0.1)
Basophils Relative: 1 %
Eosinophils Absolute: 0.3 10*3/uL (ref 0.0–0.5)
Eosinophils Relative: 5 %
HCT: 35.2 % — ABNORMAL LOW (ref 36.0–46.0)
Hemoglobin: 11.7 g/dL — ABNORMAL LOW (ref 12.0–15.0)
Immature Granulocytes: 0 %
Lymphocytes Relative: 25 %
Lymphs Abs: 1.7 10*3/uL (ref 0.7–4.0)
MCH: 24.7 pg — ABNORMAL LOW (ref 26.0–34.0)
MCHC: 33.2 g/dL (ref 30.0–36.0)
MCV: 74.4 fL — ABNORMAL LOW (ref 80.0–100.0)
Monocytes Absolute: 0.3 10*3/uL (ref 0.1–1.0)
Monocytes Relative: 5 %
Neutro Abs: 4.1 10*3/uL (ref 1.7–7.7)
Neutrophils Relative %: 64 %
Platelet Count: 155 10*3/uL (ref 150–400)
RBC: 4.73 MIL/uL (ref 3.87–5.11)
RDW: 16.8 % — ABNORMAL HIGH (ref 11.5–15.5)
WBC Count: 6.5 10*3/uL (ref 4.0–10.5)
nRBC: 0 % (ref 0.0–0.2)

## 2022-06-10 LAB — CMP (CANCER CENTER ONLY)
ALT: 11 U/L (ref 0–44)
AST: 11 U/L — ABNORMAL LOW (ref 15–41)
Albumin: 3.5 g/dL (ref 3.5–5.0)
Alkaline Phosphatase: 87 U/L (ref 38–126)
Anion gap: 4 — ABNORMAL LOW (ref 5–15)
BUN: 11 mg/dL (ref 6–20)
CO2: 28 mmol/L (ref 22–32)
Calcium: 8.8 mg/dL — ABNORMAL LOW (ref 8.9–10.3)
Chloride: 109 mmol/L (ref 98–111)
Creatinine: 0.73 mg/dL (ref 0.44–1.00)
GFR, Estimated: >60 mL/min (ref >60)
Glucose, Bld: 90 mg/dL (ref 70–99)
Potassium: 3.9 mmol/L (ref 3.5–5.1)
Sodium: 141 mmol/L (ref 135–145)
Total Bilirubin: 0.3 mg/dL (ref 0.3–1.2)
Total Protein: 6.8 g/dL (ref 6.5–8.1)

## 2022-06-10 LAB — IRON AND IRON BINDING CAPACITY (CC-WL,HP ONLY)
Iron: 49 ug/dL (ref 28–170)
Saturation Ratios: 19 % (ref 10.4–31.8)
TIBC: 258 ug/dL (ref 250–450)
UIBC: 209 ug/dL (ref 148–442)

## 2022-06-10 LAB — FERRITIN: Ferritin: 206 ng/mL (ref 11–307)

## 2022-06-10 MED ORDER — ZOLEDRONIC ACID 4 MG/100ML IV SOLN
4.0000 mg | Freq: Once | INTRAVENOUS | Status: AC
  Administered 2022-06-10: 4 mg via INTRAVENOUS
  Filled 2022-06-10: qty 100

## 2022-06-13 LAB — KAPPA/LAMBDA LIGHT CHAINS
Kappa free light chain: 26.7 mg/L — ABNORMAL HIGH (ref 3.3–19.4)
Kappa, lambda light chain ratio: 1.59 (ref 0.26–1.65)
Lambda free light chains: 16.8 mg/L (ref 5.7–26.3)

## 2022-06-15 ENCOUNTER — Ambulatory Visit: Payer: BC Managed Care – PPO | Admitting: Orthopedic Surgery

## 2022-06-16 ENCOUNTER — Encounter: Payer: Self-pay | Admitting: Hematology

## 2022-06-16 LAB — MULTIPLE MYELOMA PANEL, SERUM
Albumin SerPl Elph-Mcnc: 3.2 g/dL (ref 2.9–4.4)
Albumin/Glob SerPl: 1.1 (ref 0.7–1.7)
Alpha 1: 0.2 g/dL (ref 0.0–0.4)
Alpha2 Glob SerPl Elph-Mcnc: 0.6 g/dL (ref 0.4–1.0)
B-Globulin SerPl Elph-Mcnc: 1 g/dL (ref 0.7–1.3)
Gamma Glob SerPl Elph-Mcnc: 1.1 g/dL (ref 0.4–1.8)
Globulin, Total: 3 g/dL (ref 2.2–3.9)
IgA: 178 mg/dL (ref 87–352)
IgG (Immunoglobin G), Serum: 1248 mg/dL (ref 586–1602)
IgM (Immunoglobulin M), Srm: 28 mg/dL (ref 26–217)
Total Protein ELP: 6.2 g/dL (ref 6.0–8.5)

## 2022-06-16 NOTE — Progress Notes (Signed)
[facility] PREP Weekly Session  Patient Details  Name: Hannah [name] MRN: [id number] Date of Birth: March 01, 1961 Age: 62 y.o. PCP: [name], MD  There were no vitals filed for this visit.   [facility] Weekly seesion - 06/16/22 1100       [facility] "PREP" Location   [facility]P" [facility]      Weekly Session   Topic Discussed Goal setting and welcome to the program   scale of perceived exertion, fit testing   Classes attended to date 2             Barnett Hatter 06/16/2022, 11:42 AM

## 2022-06-17 ENCOUNTER — Telehealth: Payer: Self-pay

## 2022-06-17 NOTE — Telephone Encounter (Signed)
Spoke with pt via telephone regarding MyChart message.  Informed pt that Dr. Burr Medico stated the dentist can prescribe whatever antibiotic preferred and for the pt to stop taking the Revlimid until infection has cleared.  Informed pt that Dr. Burr Medico will hold the pt's next Zometa in April.  Pt asked does Dr. Burr Medico want her to wait a few weeks before possibly having the tooth extracted since she just got her Zometa infusion last week?  Informed pt that this RN will ask Dr. Burr Medico and let pt know via MyChart message.  Pt verbalized understanding and had no further questions or concerns at this time.

## 2022-06-20 NOTE — Progress Notes (Signed)
[facility] PREP Weekly Session  Patient Details  Name: Hannah [name] MRN: [id number] Date of Birth: [birth year] Age: 62 y.o. PCP: [name], MD  Vitals:   06/20/22 1030  Weight: 217 lb (98.4 kg)     [facility] Weekly seesion - 06/20/22 1200       [facility] "PREP" Location   [facility]PREP" Location [facility]      Weekly Session   Topic Discussed Importance of resistance training;Other ways to be active    Minutes exercised this week 35 minutes    Classes attended to date [place] 06/20/2022, 12:40 PM

## 2022-06-21 ENCOUNTER — Ambulatory Visit: Payer: Self-pay | Admitting: Licensed Clinical Social Worker

## 2022-06-21 ENCOUNTER — Telehealth: Payer: Self-pay | Admitting: Licensed Clinical Social Worker

## 2022-06-21 NOTE — Patient Outreach (Signed)
  Care Coordination   06/21/2022 Name: Hannah [name] MRN: [id number] DOB: 1960/09/04   Care Coordination Outreach Attempts:  An unsuccessful telephone outreach was attempted for a scheduled appointment today.  Follow Up Plan:  Additional outreach attempts will be made to offer the patient care coordination information and services.   Encounter Outcome:  No Answer   Care Coordination Interventions:  No, not indicated    Christa See, MSW, [email or web address] Phone [phone] 9:38 AM

## 2022-06-22 NOTE — Patient Instructions (Signed)
Visit Information  Thank you for taking time to visit with me today. Please don't hesitate to contact me if I can be of assistance to you.   Following are the goals we discussed today:   Goals Addressed             This Visit's Progress    Care Coordination-Management of Dep/Anx Symptoms   On track    Care Coordination Interventions: Solution-Focused Strategies employed:  Mindfulness or Relaxation training provided Active listening / Reflection utilized  Emotional Support Provided Patient reports she is doing well. Prep program through [facility] will f/up with her regarding updated schedule  Patient is working full time. She is not eligible for FMLA until mid 2024. Pt will collaborate with MD and specialists addressing any accommodations while working LCSW encouraged pt to continue advocating for self and utilize care coordination team, as needed        Our next appointment is by telephone on 08/02/22 at 9:30 AM  Please call the care guide team at [phone] if you need to cancel or reschedule your appointment.   If you are experiencing a Mental Health or [place] or need someone to talk to, please call the Suicide and Crisis Lifeline: 988 call 911   Patient verbalizes understanding of instructions and care plan provided today and agrees to view in [place]. Active MyChart status and patient understanding of how to access instructions and care plan via MyChart confirmed with patient.     Christa See, MSW, [email or web address] Phone [phone] 5:23 PM

## 2022-06-22 NOTE — Patient Outreach (Signed)
  Care Coordination   Follow Up Visit Note   06/21/22 Name: Hannah [name] MRN: [id number] DOB: 07/07/1960  Hannah [name] is a 62 y.o. year old female who sees Tisovec, Fransico Him, MD for primary care. I spoke with  [name] by phone today.  What matters to the patients health and wellness today?  Supportive Resources    Goals Addressed             This Visit's Progress    Care Coordination-Management of Dep/Anx Symptoms   On track    Care Coordination Interventions: Solution-Focused Strategies employed:  Mindfulness or Relaxation training provided Active listening / Reflection utilized  Emotional Support Provided Patient reports she is doing well. Prep program through [facility] will f/up with her regarding updated schedule  Patient is working full time. She is not eligible for FMLA until mid 2024. Pt will collaborate with MD and specialists addressing any accommodations while working LCSW encouraged pt to continue advocating for self and utilize care coordination team, as needed        SDOH assessments and interventions completed:  No     Care Coordination Interventions:  Yes, provided   Follow up plan: Follow up call scheduled for 4-6 weeks    Encounter Outcome:  Pt. Visit Completed   Christa See, MSW, [email or web address] Phone [phone] 5:22 PM

## 2022-06-24 ENCOUNTER — Other Ambulatory Visit (HOSPITAL_BASED_OUTPATIENT_CLINIC_OR_DEPARTMENT_OTHER): Payer: Self-pay

## 2022-06-27 ENCOUNTER — Ambulatory Visit: Payer: BC Managed Care – PPO | Admitting: Orthopedic Surgery

## 2022-06-29 ENCOUNTER — Telehealth: Payer: Self-pay

## 2022-06-29 ENCOUNTER — Other Ambulatory Visit: Payer: Self-pay | Admitting: Hematology

## 2022-06-29 NOTE — Telephone Encounter (Signed)
Called Patient regarding  form received by email  for Patient Financial Assistance through [employer].  Informed Patient that email was forwarded to Lady Deutscher in Pharmacy who handles copayment assistance and authorization programs for oral chemo medications. Provided Patient with telephone number should she have questions regarding application process. Patient verbalized understanding. No other needs or concerns verbalized at this time.

## 2022-06-30 NOTE — Progress Notes (Signed)
[employer] PREP Weekly Session  Patient Details  Name: Hannah [name] MRN: [id number] Date of Birth: 06/06/1960 Age: 62 y.o. PCP: [name], MD  Vitals:   06/27/22 1030  Weight: 217 lb (98.4 kg)     [facility] Weekly seesion - 06/30/22 1700       [facility] "PREP" Location   [facility] "PREP" [facility]      Weekly Session   Topic Discussed Healthy eating tips    Minutes exercised this week 45 minutes    Classes attended to date [place] 06/30/2022, 5:49 PM

## 2022-07-01 ENCOUNTER — Other Ambulatory Visit: Payer: Self-pay

## 2022-07-01 MED ORDER — LENALIDOMIDE 10 MG PO CAPS: ORAL_CAPSULE | ORAL | 0 refills | Status: DC

## 2022-07-04 ENCOUNTER — Telehealth: Payer: Self-pay | Admitting: Pharmacy Technician

## 2022-07-04 NOTE — Telephone Encounter (Signed)
Oral Oncology Patient Advocate Encounter   Submitted physician verification form to LLS via online portal.  Lady Deutscher, CPhT-Adv Oncology Pharmacy Patient [place] Direct Number: [phone]  Fax: [phone]

## 2022-07-04 NOTE — Progress Notes (Signed)
[employer] PREP Weekly Session  Patient Details  Name: Hannah [name] MRN: [id number] Date of Birth: 1961-02-25 Age: 62 y.o. PCP: [name], MD  Vitals:   07/04/22 1030  Weight: 218 lb (98.9 kg)     [facility] Weekly seesion - 07/04/22 1500       [facility] "PREP" Location   [facility] "PREP" Location [facility]      Weekly Session   Topic Discussed Health habits    Minutes exercised this week 80 minutes    Classes attended to date [place] 07/04/2022, 3:36 PM

## 2022-07-13 ENCOUNTER — Ambulatory Visit: Payer: BC Managed Care – PPO | Admitting: Orthopedic Surgery

## 2022-07-14 ENCOUNTER — Encounter: Payer: Self-pay | Admitting: Hematology

## 2022-07-17 NOTE — Progress Notes (Deleted)
Symptom Management Consult note [place]    Patient Care Team: Tisovec, Fransico Him, MD as PCP - General (Internal Medicine) Bo Merino, MD as Consulting Physician (Rheumatology) Truitt Merle, MD as Consulting Physician (Hematology) Waymon Amato, MD as Consulting Physician (Obstetrics and Gynecology) Daneen Schick as [place] Management Rebekah [place]field, LCSW as Social Worker (Licensed Clinical Social Worker)    Name / MRN / DOB: Hannah [name]  [id number]  11/14/1960   Date of visit: 07/18/2022   Chief Complaint/Reason for visit: ***   Current Therapy: Maintenance Revlimid 10 mg days 1-21 q 28 days with Zometa q3 months  Last treatment:  Zometa on 06/10/22   ASSESSMENT & PLAN: Patient is a 62 y.o. female  with oncologic history of multiple myeloma in remission followed by Dr. Burr Medico.  I have viewed most recent oncology note and lab work.    #Multiple myeloma in remission - s/p high-dose chemo with melphalan and autologous stem cell transplant at [facility] 12/23/20  - Next appointment with oncologist is 09/07/22   #  With her hip and back bone pain, will get xrays to look for pathologic fractures  CT? CBC CMP ?multiple myeloma panel   Established patient at [facility]   Heme/Onc History: Oncology History Overview Note  Cancer Staging Multiple myeloma ([place]) Staging form: Plasma Cell Myeloma and Plasma Cell Disorders, AJCC 8th Edition - Clinical stage from 07/20/2020: Beta-2-microglobulin (mg/L): 2.4, Albumin (g/dL): 3.2, ISS: Stage II, High-risk cytogenetics: Unknown, LDH: Normal - Signed by Truitt Merle, MD on 08/02/2020 Beta 2 microglobulin range (mg/L): Less than 3.5 Albumin range (g/dL): Less than 3.5 Cytogenetics: Unknown    Multiple myeloma in remission ([place])  07/03/2020 Imaging   MRI Lumbar Spine  IMPRESSION: 1. Numerous T1 hypointense and STIR hyperintense lesions throughout the visualized spine and sacrum with  multiple areas of extraosseous extension in the sacrum, detailed above and compatible with multiple myeloma given the clinical history. 2. An MRI of the pelvis with contrast could evaluate the full extent of the partially imaged sacral lesions, including left S1-S2 neural foraminal involvement and suspected involvement of the exited left L5 nerve. 3. Multilevel degenerative change without significant canal or foraminal stenosis in the lumbar spine.   07/13/2020 Initial Diagnosis   Multiple myeloma ([place])   07/20/2020 Cancer Staging   Staging form: Plasma Cell Myeloma and Plasma Cell Disorders, AJCC 8th Edition - Clinical stage from 07/20/2020: Beta-2-microglobulin (mg/L): 2.4, Albumin (g/dL): 3.2, ISS: Stage II, High-risk cytogenetics: Unknown, LDH: Normal - Signed by Truitt Merle, MD on 03/09/2022 Stage prefix: Initial diagnosis Beta 2 microglobulin range (mg/L): Less than 3.5 Albumin range (g/dL): Less than 3.5 Cytogenetics: No abnormalities Bone disease on imaging: Present   07/24/2020 PET scan   IMPRESSION: 1. Moderate to high hypermetabolic activity associated with the expansile soft tissue masses within the LEFT and RIGHT pelvic bones is consistent with active multiple myeloma / plasmacytoma. 2. Lytic lesion in the LEFT scapula with mild metabolic activity is indeterminate. 3. Metabolic activity associated with the RIGHT knee prosthetic and RIGHT distal foot fusion is favored post procedural inflammation.     07/30/2020 - 08/12/2020 Radiation Therapy   Palliative Radiation to Pelvic lesions with Dr Lisbeth Renshaw    07/30/2020 Pathology Results   DIAGNOSIS:   BONE MARROW, ASPIRATE, CLOT, CORE:  -Normocellular to slightly hypercellular bone marrow for age with  plasmacytosis  -See comment   PERIPHERAL BLOOD:  -Microcytic-normochromic anemia   COMMENT:   The  bone marrow shows increased number of atypical plasma cells  representing 31% of all cells in the aspirate associated with  prominent  interstitial infiltrates and numerous variably sized clusters in the  clot and biopsy sections.  The findings are most consistent with  persistent/recurrent/previously known plasma cell neoplasm.  For  completeness, immunohistochemical stain for CD138 and in situ  hybridization for kappa and lambda will be performed and the results  reported in an addendum.  Correlation with cytogenetic and FISH studies  is recommended.   08/03/2020 -  Chemotherapy   Zometa q4weeks starting 08/03/20    08/17/2020 - 11/29/2020 Chemotherapy   Velcade weekly, Revlimid, dex 57m weekly (VRd)  Starting 08/17/20. Last dose Velcade 11/23/20 and last dose revlimid on 11/29/20        Interval history-: Hannah LAMONDis a 62y.o. female with [facility] as [facility] and lower back pain.  Patient mentioned right hip pain that is brought on by sitting for long period of time during last visit with oncology 06/10/22. Multiple myeloma panel from that visit was unremarkable. Kappa free light chain starting to trend down. It was 36 x 4 months ago and most recently was 26.7.  Had right total knee arthroplasty in 2018 Had MR lumbar spine 09/02/21 with impression: for low back and right hip pain 1. Interval treatment response with regression of the large pelvis deposits and resolution of the widespread lumbar involvement. No acute pathologic fracture. 2. Mild enhancement around the degenerated L4-5 facets which could reflect active facet arthritis. No neural impingement   Pain worse with movement and does not bother her during sleep. Numbness or tingling, bowel or urinary retention or incontinence  ROS  All other systems are reviewed and are negative for acute change except as noted in the HPI.    Allergies  Allergen Reactions   Elemental Sulfur Swelling    Facial swelling    Sulfa Antibiotics Other (See Comments)     Past Medical History:   Diagnosis Date   Allergy    Anemia    Anxiety    Arthritis    Asthma    Depression    Hypertension      Past Surgical History:  Procedure Laterality Date   CHOLECYSTECTOMY  1993   COLONOSCOPY     TOTAL KNEE ARTHROPLASTY Right 01/28/2016   TOTAL KNEE ARTHROPLASTY Right 01/28/2016   Procedure: RIGHT TOTAL KNEE ARTHROPLASTY;  Surgeon: NLeandrew Koyanagi MD;  [facility]  Service: Orthopedics;  Laterality: Right;   TRIGGER FINGER RELEASE Right 06/18/2014   Procedure: RIGHT LONG FINGER TRIGGER RELEASE;  Surgeon: NMarianna Payment MD;  [facility]  Service: Orthopedics;  Laterality: Right;   TUBAL LIGATION     VAGINAL HYSTERECTOMY Bilateral 11/04/2016   Procedure: HYSTERECTOMY VAGINAL with Bilateral Salpingectomy;  Surgeon: HEldred Manges MD;  [facility];  Service: Gynecology;  Laterality: Bilateral;   WEIL OSTEOTOMY Right 07/07/2020   Procedure: RIGHT FOOT WEIL OSTEOTOMY 2, 3, AND 4 METATARSALS AND PROXIMAL INTERPHALANGEAL JOINT FUSION 2 & 4 TOES;  Surgeon: DNewt Minion MD;  [facility]  Service: Orthopedics;  Laterality: Right;    Social History   Socioeconomic History   Marital status: Married    Spouse name: Not on file   Number of children: Not on file   Years of education: Not on file   Highest education level: Not on  file  Occupational History   Not on file  Tobacco Use   Smoking status: Never   Smokeless tobacco: Never  Vaping Use   Vaping Use: Never used  Substance and Sexual Activity   Alcohol use: No    Alcohol/week: 0.0 standard drinks of alcohol   Drug use: No   Sexual activity: Yes    Partners: Male    Birth control/protection: Surgical  Other Topics Concern   Not on file  Social History Narrative   Not on file   Social Determinants of Health   Financial Resource Strain: Low Risk  (02/03/2022)   Overall Financial Resource Strain (CARDIA)    Difficulty of Paying Living Expenses: Not hard at  all  Food Insecurity: No Food Insecurity (02/03/2022)   Hunger Vital Sign    Worried About Running Out of Food in the Last Year: Never true    [place] in the Last Year: Never true  Transportation Needs: No Transportation Needs (02/03/2022)   PRAPARE - Hydrologist (Medical): No    Lack of Transportation (Non-Medical): No  Physical Activity: Inactive (11/04/2017)   Exercise Vital Sign    Days of Exercise per Week: 0 days    Minutes of Exercise per Session: 0 min  Stress: Stress Concern Present (11/04/2017)   [place]    Feeling of Stress : Very much  Social Connections: Moderately Integrated (07/28/2020)   Social Connection and Isolation Panel [NHANES]    Frequency of Communication with Friends and Family: More than three times a week    Frequency of Social Gatherings with Friends and Family: More than three times a week    Attends Religious Services: More than 4 times per year    Active Member of [employer] or Organizations: No    Attends Archivist Meetings: Never    Marital Status: Married  Human resources officer Violence: Not At Risk (07/28/2020)   Humiliation, Afraid, Rape, and Kick questionnaire    Fear of Current or Ex-Partner: No    Emotionally Abused: No    Physically Abused: No    Sexually Abused: No    Family History  Problem Relation Age of Onset   Cancer Mother        uterine    Asthma Mother    Arthritis Daughter    Depression Maternal Uncle    Colon cancer Neg Hx      Current Outpatient Medications:    aspirin EC 81 MG tablet, Take 81 mg by mouth daily. Swallow whole., Disp: , Rfl:    calcium carbonate (OSCAL) 1500 (600 Ca) MG TABS tablet, Take 600 mg of elemental calcium by mouth daily., Disp: , Rfl:    Cholecalciferol (VITAMIN D3) 1.25 MG (50000 UT) CAPS, Take 1 capsule by mouth once a week., Disp: , Rfl:    ferrous sulfate 325 (65 FE) MG tablet, Take 325 mg by  mouth 2 (two) times daily with a meal., Disp: , Rfl:    folic acid (FOLVITE) 1 MG tablet, folic acid 1 mg tablet  TAKE 1 TABLET BY MOUTH EVERY DAY, Disp: , Rfl:    lenalidomide (REVLIMID) 10 MG capsule, TAKE 1 CAPSULE BY MOUTH 1 TIME A DAY FOR 21 DAYS ON THEN 7 DAYS OFF: [employer] [id number] Eff:06/29/2022 - May substitute for Brand Revlimid, Disp: 21 capsule, Rfl: 0   naproxen sodium (ALEVE) 220 MG tablet, as needed., Disp: , Rfl:  valACYclovir (VALTREX) 500 MG tablet, Take 500 mg by mouth 2 (two) times daily., Disp: , Rfl:    vitamin C (ASCORBIC ACID) 500 MG tablet, , Disp: , Rfl:   PHYSICAL EXAM: ECOG FS:{CHL ONC [id number]   There were no vitals filed for this visit. Physical Exam     LABORATORY DATA: I have reviewed the data as listed    Latest Ref Rng & Units 06/10/2022    9:54 AM 03/09/2022    8:10 AM 12/28/2021    2:39 PM  CBC  WBC 4.0 - 10.5 K/uL 6.5  6.3  5.2   Hemoglobin 12.0 - 15.0 g/dL 11.7  11.5  10.8   Hematocrit 36.0 - 46.0 % 35.2  35.0  32.2   Platelets 150 - 400 K/uL 155  165  172         Latest Ref Rng & Units 06/10/2022    9:54 AM 03/09/2022    8:10 AM 12/28/2021    2:39 PM  CMP  Glucose 70 - 99 mg/dL 90  102  91   BUN 6 - 20 mg/dL 11  12  13   $ Creatinine 0.44 - 1.00 mg/dL 0.73  0.82  0.84   Sodium 135 - 145 mmol/L 141  141  141   Potassium 3.5 - 5.1 mmol/L 3.9  3.6  3.6   Chloride 98 - 111 mmol/L 109  106  106   CO2 22 - 32 mmol/L 28  29  30   $ Calcium 8.9 - 10.3 mg/dL 8.8  8.5  8.8   Total Protein 6.5 - 8.1 g/dL 6.8  7.1  7.1   Total Bilirubin 0.3 - 1.2 mg/dL 0.3  0.5  0.4   Alkaline Phos 38 - 126 U/L 87  71  67   AST 15 - 41 U/L 11  12  14   $ ALT 0 - 44 U/L 11  9  9        $ RADIOGRAPHIC STUDIES (from last 24 hours if applicable) I have personally reviewed the radiological images as listed and agreed with the findings in the report. No results found.      Visit Diagnosis: No diagnosis found.   No orders of the defined types were  placed in this encounter.   All questions were answered. The patient knows to call the clinic with any problems, questions or concerns. No barriers to learning was detected.  I have spent a total of *** minutes minutes of face-to-face and non-face-to-face time, preparing to see the patient, obtaining and/or reviewing separately obtained history, performing a medically appropriate examination, counseling and educating the patient, ordering tests, documenting clinical information in the electronic health record, and care coordination (communications with other health care professionals or caregivers).    Thank you for allowing me to participate in the care of this patient.    Barrie Folk, PA-C Department of Hematology/Oncology [facility] at [facility] Phone: [phone]  [phone]    07/17/2022 8:00 PM

## 2022-07-18 ENCOUNTER — Other Ambulatory Visit: Payer: Self-pay

## 2022-07-18 ENCOUNTER — Encounter: Payer: BC Managed Care – PPO | Admitting: Physician Assistant

## 2022-07-18 ENCOUNTER — Telehealth: Payer: Self-pay

## 2022-07-18 ENCOUNTER — Encounter: Payer: Self-pay | Admitting: Hematology

## 2022-07-18 DIAGNOSIS — C9001 Multiple myeloma in remission: Secondary | ICD-10-CM

## 2022-07-18 NOTE — Telephone Encounter (Signed)
This RN called patient to reschedule [facility] appointment. Appointment made for tomorrow, 2/20 at 0830.

## 2022-07-18 NOTE — Progress Notes (Signed)
Lab orders entered for [facility] visit.

## 2022-07-19 ENCOUNTER — Telehealth: Payer: Self-pay

## 2022-07-19 ENCOUNTER — Inpatient Hospital Stay: Payer: BC Managed Care – PPO | Attending: Hematology | Admitting: Physician Assistant

## 2022-07-19 ENCOUNTER — Ambulatory Visit (HOSPITAL_COMMUNITY)
Admission: RE | Admit: 2022-07-19 | Discharge: 2022-07-19 | Disposition: A | Discharge status: Home / Self Care | Payer: BC Managed Care – PPO | Source: Ambulatory Visit | Attending: Physician Assistant | Admitting: Physician Assistant

## 2022-07-19 VITALS — BP 140/94 | HR 84 | Temp 98.4°F | Resp 16 | Wt 220.7 lb

## 2022-07-19 DIAGNOSIS — M545 Low back pain, unspecified: Secondary | ICD-10-CM

## 2022-07-19 DIAGNOSIS — I10 Essential (primary) hypertension: Secondary | ICD-10-CM | POA: Insufficient documentation

## 2022-07-19 DIAGNOSIS — Z9484 Stem cells transplant status: Secondary | ICD-10-CM | POA: Insufficient documentation

## 2022-07-19 DIAGNOSIS — Z882 Allergy status to sulfonamides status: Secondary | ICD-10-CM | POA: Diagnosis not present

## 2022-07-19 DIAGNOSIS — M1612 Unilateral primary osteoarthritis, left hip: Secondary | ICD-10-CM | POA: Diagnosis not present

## 2022-07-19 DIAGNOSIS — Z7961 Long term (current) use of immunomodulator: Secondary | ICD-10-CM | POA: Insufficient documentation

## 2022-07-19 DIAGNOSIS — M47817 Spondylosis without myelopathy or radiculopathy, lumbosacral region: Secondary | ICD-10-CM | POA: Insufficient documentation

## 2022-07-19 DIAGNOSIS — C9001 Multiple myeloma in remission: Secondary | ICD-10-CM | POA: Insufficient documentation

## 2022-07-19 DIAGNOSIS — D509 Iron deficiency anemia, unspecified: Secondary | ICD-10-CM | POA: Insufficient documentation

## 2022-07-19 DIAGNOSIS — M160 Bilateral primary osteoarthritis of hip: Secondary | ICD-10-CM | POA: Insufficient documentation

## 2022-07-19 DIAGNOSIS — M47816 Spondylosis without myelopathy or radiculopathy, lumbar region: Secondary | ICD-10-CM | POA: Insufficient documentation

## 2022-07-19 DIAGNOSIS — Z9079 Acquired absence of other genital organ(s): Secondary | ICD-10-CM | POA: Insufficient documentation

## 2022-07-19 DIAGNOSIS — Z9049 Acquired absence of other specified parts of digestive tract: Secondary | ICD-10-CM | POA: Diagnosis not present

## 2022-07-19 DIAGNOSIS — Z79899 Other long term (current) drug therapy: Secondary | ICD-10-CM | POA: Diagnosis not present

## 2022-07-19 DIAGNOSIS — M25552 Pain in left hip: Secondary | ICD-10-CM

## 2022-07-19 LAB — CMP (CANCER CENTER ONLY)
ALT: 9 U/L (ref 0–44)
AST: 14 U/L — ABNORMAL LOW (ref 15–41)
Albumin: 3.9 g/dL (ref 3.5–5.0)
Alkaline Phosphatase: 79 U/L (ref 38–126)
Anion gap: 5 (ref 5–15)
BUN: 17 mg/dL (ref 6–20)
CO2: 29 mmol/L (ref 22–32)
Calcium: 8.6 mg/dL — ABNORMAL LOW (ref 8.9–10.3)
Chloride: 107 mmol/L (ref 98–111)
Creatinine: 0.75 mg/dL (ref 0.44–1.00)
GFR, Estimated: >60 mL/min (ref >60)
Glucose, Bld: 102 mg/dL — ABNORMAL HIGH (ref 70–99)
Potassium: 4 mmol/L (ref 3.5–5.1)
Sodium: 141 mmol/L (ref 135–145)
Total Bilirubin: 0.3 mg/dL (ref 0.3–1.2)
Total Protein: 6.9 g/dL (ref 6.5–8.1)

## 2022-07-19 LAB — CBC WITH DIFFERENTIAL (CANCER CENTER ONLY)
Abs Immature Granulocytes: 0.02 10*3/uL (ref 0.00–0.07)
Basophils Absolute: 0 10*3/uL (ref 0.0–0.1)
Basophils Relative: 0 %
Eosinophils Absolute: 0.2 10*3/uL (ref 0.0–0.5)
Eosinophils Relative: 4 %
HCT: 34.2 % — ABNORMAL LOW (ref 36.0–46.0)
Hemoglobin: 11.5 g/dL — ABNORMAL LOW (ref 12.0–15.0)
Immature Granulocytes: 0 %
Lymphocytes Relative: 23 %
Lymphs Abs: 1.4 10*3/uL (ref 0.7–4.0)
MCH: 24.9 pg — ABNORMAL LOW (ref 26.0–34.0)
MCHC: 33.6 g/dL (ref 30.0–36.0)
MCV: 74 fL — ABNORMAL LOW (ref 80.0–100.0)
Monocytes Absolute: 0.3 10*3/uL (ref 0.1–1.0)
Monocytes Relative: 5 %
Neutro Abs: 4 10*3/uL (ref 1.7–7.7)
Neutrophils Relative %: 68 %
Platelet Count: 169 10*3/uL (ref 150–400)
RBC: 4.62 MIL/uL (ref 3.87–5.11)
RDW: 16.4 % — ABNORMAL HIGH (ref 11.5–15.5)
WBC Count: 5.9 10*3/uL (ref 4.0–10.5)
nRBC: 0 % (ref 0.0–0.2)

## 2022-07-19 NOTE — Progress Notes (Signed)
Symptom Management Consult note Hannah [name]    Patient Care Team: Tisovec, Fransico Him, MD as PCP - General (Internal Medicine) Bo Merino, MD as Consulting Physician (Rheumatology) Truitt Merle, MD as Consulting Physician (Hematology) Waymon Amato, MD as Consulting Physician (Obstetrics and Gynecology) Daneen Schick as [place] Management Rebekah Chesterfield, LCSW as Social Worker (Licensed Clinical Social Worker)    Name / MRN / DOB: Hannah [name]  [id number]  1961/03/03   Date of visit: 07/19/2022   Chief Complaint/Reason for visit: left hip and low back pain     Current Therapy: Maintenance Revlimid 10 mg days 1-21 q 28 days with Zometa q3 months   Last treatment:  Zometa on 06/10/22     ASSESSMENT & PLAN: Patient is a 62 y.o. female  with oncologic history of multiple myeloma in remission followed by Dr. Burr Medico.  I have viewed most recent oncology note and lab work.     #Multiple myeloma in remission - s/p high-dose chemo with melphalan and autologous stem cell transplant at [facility] 12/23/20  -Currently holding Revlimid while waiting on dental procedure per oncologist - Next appointment with oncologist is 09/07/22    #Hip and back pain -Patient is well-appearing on exam.  Hemodynamically stable.  She is ambulatory with normal gait.  Pain is not reproducible.  Normal lower extremity neuroexam.  No red flag symptoms. -Chart review shows patient mentioned right hip pain that is brought on by sitting for long period of time during last visit with oncology 06/10/22. Multiple myeloma panel from that visit was unremarkable. Kappa free light chain starting to trend down. It was 36 x 4 months ago and most recently was 26.7. -Basic labs were checked and overall unremarkable.  CBC with stable anemia, CMP with normal renal function.  No obvious abnormalities to suggest multiple myeloma at this time although imaging is still warranted for further  evaluation to look for pathologic fractures, bone lesions and to see if arthritis could be the cause of her pain as well. -I viewed imaging which shows mild degenerative changes in bilateral hips, left worse than right and L4-L5 and L5-S1.Marland Kitchen  Known lytic lesions in the right iliac wing and left sacral ala when compared to prior MRI and PET-CT. -Engaged in shared decision making with patient and she does not feel she needs pain medication or steroid dosepak at this time. She will continue to manage pain with OTC medications until she follows up with [facility]. -Plan was discussed with primary onc team who is agreeable.  -Patient encouraged to return to clinic should symptoms worsen or new ones develop. ED precautions discussed as well. Patient is agreeable with plan of care.   Heme/Onc History: Oncology History Overview Note  Cancer Staging Multiple myeloma ([place]) Staging form: Plasma Cell Myeloma and Plasma Cell Disorders, AJCC 8th Edition - Clinical stage from 07/20/2020: Beta-2-microglobulin (mg/L): 2.4, Albumin (g/dL): 3.2, ISS: Stage II, High-risk cytogenetics: Unknown, LDH: Normal - Signed by Truitt Merle, MD on 08/02/2020 Beta 2 microglobulin range (mg/L): Less than 3.5 Albumin range (g/dL): Less than 3.5 Cytogenetics: Unknown    Multiple myeloma in remission ([place])  07/03/2020 Imaging   MRI Lumbar Spine  IMPRESSION: 1. Numerous T1 hypointense and STIR hyperintense lesions throughout the visualized spine and sacrum with multiple areas of extraosseous extension in the sacrum, detailed above and compatible with multiple myeloma given the clinical history. 2. An MRI of the pelvis with contrast could evaluate the full extent  of the partially imaged sacral lesions, including left S1-S2 neural foraminal involvement and suspected involvement of the exited left L5 nerve. 3. Multilevel degenerative change without significant canal or foraminal stenosis in the lumbar spine.   07/13/2020 Initial  Diagnosis   Multiple myeloma ([place])   07/20/2020 Cancer Staging   Staging form: Plasma Cell Myeloma and Plasma Cell Disorders, AJCC 8th Edition - Clinical stage from 07/20/2020: Beta-2-microglobulin (mg/L): 2.4, Albumin (g/dL): 3.2, ISS: Stage II, High-risk cytogenetics: Unknown, LDH: Normal - Signed by Truitt Merle, MD on 03/09/2022 Stage prefix: Initial diagnosis Beta 2 microglobulin range (mg/L): Less than 3.5 Albumin range (g/dL): Less than 3.5 Cytogenetics: No abnormalities Bone disease on imaging: Present   07/24/2020 PET scan   IMPRESSION: 1. Moderate to high hypermetabolic activity associated with the expansile soft tissue masses within the LEFT and RIGHT pelvic bones is consistent with active multiple myeloma / plasmacytoma. 2. Lytic lesion in the LEFT scapula with mild metabolic activity is indeterminate. 3. Metabolic activity associated with the RIGHT knee prosthetic and RIGHT distal foot fusion is favored post procedural inflammation.     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  The findings are most consistent with  persistent/recurrent/previously known plasma cell neoplasm.  For  completeness, immunohistochemical stain for CD138 and in situ  hybridization for kappa and lambda will be performed and the results  reported in an addendum.  Correlation with cytogenetic and FISH studies  is recommended.   08/03/2020 -  Chemotherapy   Zometa q4weeks  starting 08/03/20    08/17/2020 - 11/29/2020 Chemotherapy   Velcade weekly, Revlimid, dex 76m weekly (VRd)  Starting 08/17/20. Last dose Velcade 11/23/20 and last dose revlimid on 11/29/20        Interval history-:  Hannah SESTERis a 62y.o. female with oncologic history as [facility] of left hip and lower back pain x 4 weeks. She presents unaccompanied to clinic today.  Patient reports the pain is intermittent.  Patient describes the pain as aching and sharp.  Pain is worse with activity or with prolonged period of immobilization.  Patient admits to difficulty sleeping because of the pain as she cannot get into a comfortable position.  Pain does not wake her up from sleep.  Pain does not radiate down her leg.  She denies any fall or injury.  She has been taking Aleve and extra strength Tylenol which does make the pain more tolerable.  Patient states she is currently enrolled in a wellness plan that she started in mid January.  She has 1 fitness class a week that she describes as low intensity workouts and stretching.  Patient denies any numbness or tingling, urinary or bowel incontinence or retention.  Patient admits to extensive history of arthritis although is unsure if she is experiencing pain from arthritis or if she should be concerned her multiple myeloma has returned. Denies unintentional weight loss.  Patient admits she had similar pain last year  however it was located in her right hip and low back.  She underwent MR lumbar spine that showed interval treatment response with regression of large pelvis deposits and resolution of widespread lumbar involvement.  Mild enhancement around the degenerated L4-5 facets that could reflect arthritis.    ROS  All other systems are reviewed and are negative for acute change except as noted in the HPI.    Allergies  Allergen Reactions   Elemental Sulfur Swelling    Facial swelling    Sulfa Antibiotics Other (See  Comments)     Past Medical History:  Diagnosis Date   Allergy    Anemia    Anxiety    Arthritis    Asthma    Depression    Hypertension      Past Surgical History:  Procedure Laterality Date   CHOLECYSTECTOMY  1993   COLONOSCOPY     TOTAL KNEE ARTHROPLASTY Right 01/28/2016   TOTAL KNEE ARTHROPLASTY Right 01/28/2016   Procedure: RIGHT TOTAL KNEE ARTHROPLASTY;  Surgeon: [name], MD;  Location: [place];  Service: Orthopedics;  Laterality: Right;   TRIGGER FINGER RELEASE Right 06/18/2014   Procedure: RIGHT LONG FINGER TRIGGER RELEASE;  Surgeon: [name], MD;  Location: [place];  Service: Orthopedics;  Laterality: Right;   TUBAL LIGATION     VAGINAL HYSTERECTOMY Bilateral 11/04/2016   Procedure: HYSTERECTOMY VAGINAL with Bilateral Salpingectomy;  Surgeon: [name], MD;  Location: [facility];  Service: Gynecology;  Laterality: Bilateral;   WEIL OSTEOTOMY Right 07/07/2020   Procedure: RIGHT FOOT WEIL OSTEOTOMY 2, 3, AND 4 METATARSALS AND PROXIMAL INTERPHALANGEAL JOINT FUSION 2 & 4 TOES;  Surgeon: Newt Minion, MD;  Location: [place];  Service: Orthopedics;  Laterality: Right;    Social History   Socioeconomic History   Marital status: Married    Spouse name: Not on file   Number of children: Not on file   Years of education: Not on file   Highest education level: Not on file  Occupational History   Not on file  Tobacco Use   Smoking status: Never   Smokeless tobacco: Never  Vaping Use   Vaping Use: Never used  Substance and Sexual Activity   Alcohol use: No    Alcohol/week: 0.0 standard drinks of alcohol   Drug use: No   Sexual activity: Yes    Partners: Male    Birth control/protection: Surgical  Other Topics Concern   Not on file  Social History Narrative   Not on file   Social Determinants of Health   Financial Resource Strain: Low Risk  (02/03/2022)   Overall Financial Resource Strain (CARDIA)    Difficulty  of Paying Living Expenses: Not hard at all  Food Insecurity: No Food Insecurity (02/03/2022)   Hunger Vital Sign    Worried About Running Out of Food in the Last Year: Never true    [place] in the Last Year: Never true  Transportation Needs: No Transportation Needs (02/03/2022)   PRAPARE - Hydrologist (Medical): No    Lack of Transportation (Non-Medical): No  Physical Activity: Inactive (11/04/2017)   Exercise Vital Sign    Days of Exercise per Week: 0 days    Minutes of Exercise per Session: 0 min  Stress: Stress Concern Present (11/04/2017)   [place]    Feeling of Stress : Very much  Social Connections:  Moderately Integrated (07/28/2020)   Social Connection and Isolation Panel [NHANES]    Frequency of Communication with Friends and Family: More than three times a week    Frequency of Social Gatherings with Friends and Family: More than three times a week    Attends Religious Services: More than 4 times per year    Active Member of [employer] or Organizations: No    Attends Archivist Meetings: Never    Marital Status: Married  Human resources officer Violence: Not At Risk (07/28/2020)   Humiliation, Afraid, Rape, and Kick questionnaire    Fear of Current or Ex-Partner: No    Emotionally Abused: No    Physically Abused: No    Sexually Abused: No    Family History  Problem Relation Age of Onset   Cancer Mother        uterine    Asthma Mother    Arthritis Daughter    Depression Maternal Uncle    Colon cancer Neg Hx      Current Outpatient Medications:    aspirin EC 81 MG tablet, Take 81 mg by mouth daily. Swallow whole., Disp: , Rfl:    calcium carbonate (OSCAL) 1500 (600 Ca) MG TABS tablet, Take 600 mg of elemental calcium by mouth daily., Disp: , Rfl:    Cholecalciferol (VITAMIN D3) 1.25 MG (50000 UT) CAPS, Take 1 capsule by mouth once a week., Disp: , Rfl:    ferrous sulfate  325 (65 FE) MG tablet, Take 325 mg by mouth 2 (two) times daily with a meal., Disp: , Rfl:    folic acid (FOLVITE) 1 MG tablet, folic acid 1 mg tablet  TAKE 1 TABLET BY MOUTH EVERY DAY, Disp: , Rfl:    lenalidomide (REVLIMID) 10 MG capsule, TAKE 1 CAPSULE BY MOUTH 1 TIME A DAY FOR 21 DAYS ON THEN 7 DAYS OFF: [employer] [id number] Eff:06/29/2022 - May substitute for Brand Revlimid, Disp: 21 capsule, Rfl: 0   naproxen sodium (ALEVE) 220 MG tablet, as needed., Disp: , Rfl:    valACYclovir (VALTREX) 500 MG tablet, Take 500 mg by mouth 2 (two) times daily., Disp: , Rfl:    vitamin C (ASCORBIC ACID) 500 MG tablet, , Disp: , Rfl:   PHYSICAL EXAM: ECOG FS:1 - Symptomatic but completely ambulatory    Vitals:   07/19/22 0902  BP: (!) 140/94  Pulse: 84  Resp: 16  Temp: 98.4 F (36.9 C)  TempSrc: Oral  SpO2: 100%  Weight: 220 lb 11.2 oz (100.1 kg)   Physical Exam Vitals and nursing note reviewed.  Constitutional:      Appearance: She is well-developed. She is not ill-appearing or toxic-appearing.  HENT:     Head: Normocephalic.     Nose: Nose normal.  Eyes:     Conjunctiva/sclera: Conjunctivae normal.  Neck:     Vascular: No JVD.  Cardiovascular:     Rate and Rhythm: Normal rate and regular rhythm.     Pulses: Normal pulses.     Heart sounds: Normal heart sounds.  Pulmonary:     Effort: Pulmonary effort is normal.     Breath sounds: Normal breath sounds.  Abdominal:     General: There is no distension.  Musculoskeletal:        General: Normal range of motion.     Cervical back: Normal range of motion.     Right lower leg: No edema.     Left lower leg: No edema.     Comments: No cervical, thoracic, or  lumbar spinal tenderness to palpation.  No paraspinal tenderness. No step offs, crepitus or deformity palpated.    Skin:    General: Skin is warm and dry.     Findings: No bruising or rash.  Neurological:     General: No focal deficit present.     Mental Status: She is oriented  to person, place, and time.     Comments: Sensation grossly intact to light touch in the lower extremities bilaterally. No saddle anesthesias. Strength 5/5 with flexion and extension at the bilateral hips, knees, and ankles. No noted gait deficit. Coordination intact with heel to shin testing.        LABORATORY DATA: I have reviewed the data as listed    Latest Ref Rng & Units 07/19/2022    9:04 AM 06/10/2022    9:54 AM 03/09/2022    8:10 AM  CBC  WBC 4.0 - 10.5 K/uL 5.9  6.5  6.3   Hemoglobin 12.0 - 15.0 g/dL 11.5  11.7  11.5   Hematocrit 36.0 - 46.0 % 34.2  35.2  35.0   Platelets 150 - 400 K/uL 169  155  165         Latest Ref Rng & Units 07/19/2022    9:04 AM 06/10/2022    9:54 AM 03/09/2022    8:10 AM  CMP  Glucose 70 - 99 mg/dL 102  90  102   BUN 6 - 20 mg/dL 17  11  12   $ Creatinine 0.44 - 1.00 mg/dL 0.75  0.73  0.82   Sodium 135 - 145 mmol/L 141  141  141   Potassium 3.5 - 5.1 mmol/L 4.0  3.9  3.6   Chloride 98 - 111 mmol/L 107  109  106   CO2 22 - 32 mmol/L 29  28  29   $ Calcium 8.9 - 10.3 mg/dL 8.6  8.8  8.5   Total Protein 6.5 - 8.1 g/dL 6.9  6.8  7.1   Total Bilirubin 0.3 - 1.2 mg/dL 0.3  0.3  0.5   Alkaline Phos 38 - 126 U/L 79  87  71   AST 15 - 41 U/L 14  11  12   $ ALT 0 - 44 U/L 9  11  9        $ RADIOGRAPHIC STUDIES (from last 24 hours if applicable) I have personally reviewed the radiological images as listed and agreed with the findings in the report. DG Lumbar Spine Complete  Result Date: 07/19/2022 CLINICAL DATA:  Midline low back2 pain radiating to the left hip. History of multiple myeloma. No injury. EXAM: LUMBAR SPINE - COMPLETE 4+ VIEW COMPARISON:  Lumbar spine MRI 09/03/2018 FINDINGS: There are 5 non-rib-bearing lumbar-type vertebral bodies. Vertebral body heights are preserved, without evidence of acute injury. Alignment is normal. There is no evidence of spondylolysis. There is disc space narrowing and degenerative endplate change at 075-GRM. The  other disc heights are overall preserved. There is facet arthropathy most advanced at L4-L5. Lytic lesions in the right iliac bone and left sacral ala are noted as seen on prior cross-sectional imaging. No other definite lytic lesions are seen. IMPRESSION: 1. No acute finding in the lumbar spine. 2. Degenerative changes at L4-L5 and L5-S1 as above. 3. Lytic lesions in the right iliac bone and left sacral ala as seen on prior cross-sectional imaging. No other definite lytic lesions are identified Electronically Signed   By: Valetta Mole M.D.   On: 07/19/2022 10:55   DG  HIP UNILAT WITH PELVIS 2-3 VIEWS LEFT  Result Date: 07/19/2022 CLINICAL DATA:  Midline lower back pain radiating to the left hip. No injury. History of multiple myeloma. EXAM: DG HIP (WITH OR WITHOUT PELVIS) 2-3V LEFT COMPARISON:  Hip radiographs 09/05/2017 FINDINGS: There is no evidence of acute osseous injury in the hips. Femoroacetabular alignment is maintained. There is mild degenerative change about the hips, left worse than right and minimally progressed since 2019. The SI joints and symphysis pubis are intact. Lytic lesions are noted in the right iliac wing and left sacral ala as seen on prior MRI and PET-CT. The soft tissues are unremarkable. IMPRESSION: 1. Mild degenerative change about the hips, left worse than right and minimally progressed since 2019. No acute finding. 2. Lytic lesions in the right iliac wing and left sacral ala as seen on prior MRI and PET-CT. Electronically Signed   By: Valetta Mole M.D.   On: 07/19/2022 10:52        Visit Diagnosis: 1. Left hip pain   2. Multiple myeloma in remission ([place])   3. Midline low back pain, unspecified chronicity, unspecified whether sciatica present      Orders Placed This Encounter  Procedures   DG HIP UNILAT WITH PELVIS 2-3 VIEWS LEFT    Standing Status:   Future    Number of Occurrences:   1    Standing Expiration Date:   07/20/2023    Order Specific Question:   Reason  for Exam (SYMPTOM  OR DIAGNOSIS REQUIRED)    Answer:   pain, hx multiple myeloma    Order Specific Question:   Is patient pregnant?    Answer:   No    Order Specific Question:   Preferred imaging location?    Answer:   [facility]   DG Lumbar Spine Complete    Standing Status:   Future    Number of Occurrences:   1    Standing Expiration Date:   07/19/2023    Order Specific Question:   Reason for Exam (SYMPTOM  OR DIAGNOSIS REQUIRED)    Answer:   pain, hx multiple mylemona    Order Specific Question:   Is patient pregnant?    Answer:   No    Order Specific Question:   Preferred imaging location?    Answer:   [facility]    All questions were answered. The patient knows to call the clinic with any problems, questions or concerns. No barriers to learning was detected.  I have spent a total of 20 minutes minutes of face-to-face and non-face-to-face time, preparing to see the patient, obtaining and/or reviewing separately obtained history, performing a medically appropriate examination, counseling and educating the patient, ordering tests, documenting clinical information in the electronic health record, and care coordination (communications with other health care professionals or caregivers).    Thank you for allowing me to participate in the care of this patient.    Barrie Folk, PA-C Department of Hematology/Oncology [facility] at [facility] Phone: [phone]  [phone]    07/19/2022 4:10 PM

## 2022-07-19 NOTE — Telephone Encounter (Signed)
Called patient per request of Sherol Dade, PA-C, regarding x-ray results. Patient verbalized understanding that it is recommended for her to follow up with her orthopedic doctor and to call [facility] for any worsening symptoms.

## 2022-07-20 NOTE — Progress Notes (Signed)
[facility] PREP Weekly Session  Patient Details  Name: Hannah [name] MRN: [id number] Date of Birth: Apr 27, 1961 Age: 62 y.o. PCP: [name], MD  Vitals:   07/18/22 0944  Weight: 219 lb (99.3 kg)     [facility] Weekly seesion - 07/20/22 0900       [facility] "PREP" Location   [facility]" [facility]      Weekly Session   Topic Discussed Stress management and problem solving    Minutes exercised this week --   none reported   Classes attended to date [place] 07/20/2022, 9:45 AM

## 2022-07-21 ENCOUNTER — Encounter: Payer: Self-pay | Admitting: Hematology

## 2022-07-26 ENCOUNTER — Telehealth: Payer: Self-pay

## 2022-07-26 NOTE — Telephone Encounter (Signed)
Called Dental office of Dr. Feliberto Harts spoke with Bubba Hales and told her what we needed on the dental clearance for this patient. Need what is being done, what meds need to be stopped and when they can be restarted along with if the patient will need antibiotics for the procedure. Told her to have this typed up and Dr. Burr Medico would sign and we will fax it back. Bubba Hales stated she understood and would have the dentist type it up

## 2022-07-27 ENCOUNTER — Encounter: Payer: Self-pay | Admitting: Hematology

## 2022-07-27 ENCOUNTER — Encounter: Payer: Self-pay | Admitting: Orthopedic Surgery

## 2022-07-27 ENCOUNTER — Ambulatory Visit (INDEPENDENT_AMBULATORY_CARE_PROVIDER_SITE_OTHER): Payer: BC Managed Care – PPO | Admitting: Orthopedic Surgery

## 2022-07-27 DIAGNOSIS — M19011 Primary osteoarthritis, right shoulder: Secondary | ICD-10-CM | POA: Diagnosis not present

## 2022-07-27 NOTE — Progress Notes (Signed)
Office Visit Note   Patient: Hannah [name]           Date of Birth: 09/10/1960           MRN: [id number] Visit Date: 07/27/2022 Requested by: [name], MD [address] [place],  [place] [address] PCP: Osborne Casco, Fransico Him, MD  Subjective: Chief Complaint  Patient presents with   Other     Scan review    HPI: Hannah [name] is a 62 y.o. female who presents to the office reporting right shoulder pain and to review EMG nerve study.  Overall her hand is better.  Showed mild carpal tunnel syndrome.  Currently the arm symptoms are not as bad as they were earlier.  However her shoulder symptoms are very severe.  Shoulder gets stiff.  She has known history of arthritis in that glenohumeral joint.  Reports catching and aching at night.  Most of her pain is anterior superior.  Patient is on chemotherapy for multiple myeloma.  She gets an infusion once every 3 months as well.  She currently has an infection in her mouth and will require 1 tooth to be pulled in the near future.  Prior injection in the glenohumeral joint helped her for 1 month..                ROS: All systems reviewed are negative as they relate to the chief complaint within the history of present illness.  Patient denies fevers or chills.  Assessment & Plan: Visit Diagnoses:  1. Primary osteoarthritis of right shoulder     Plan: Impression is right shoulder glenohumeral joint arthritis.  She may be heading for shoulder replacement.  This would need to be coordinated with her oncologist.  I think there would be an increased risk of infection due to her immunosuppression.  Hard to quantify that risk precisely.  Nonetheless her quality of life now is being severely affected by her shoulder arthritis pain and diminished motion.  Follow-Up Instructions: No follow-ups on file.   Orders:  No orders of the defined types were placed in this encounter.  No orders of the defined types were placed in this encounter.      Procedures: No procedures performed   Clinical Data: No additional findings.  Objective: Vital Signs: LMP 10/22/2016 (Exact Date)   Physical Exam:  Constitutional: Patient appears well-developed HEENT:  Head: Normocephalic Eyes:EOM are normal Neck: Normal range of motion Cardiovascular: Normal rate Pulmonary/chest: Effort normal Neurologic: Patient is alert Skin: Skin is warm Psychiatric: Patient has normal mood and affect  Ortho Exam: Ortho exam demonstrates range of motion on the right of 30/70/85.  Rotator cuff strength is pretty reasonable.  Slightly weak on external rotation.  Deltoid is functional.  Motor or sensory function of the hand is intact.  Specialty Comments:  No specialty comments available.  Imaging: No results found.   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   Major depression, recurrent ([place]) 01/12/2018   Chronic right shoulder pain 12/20/2017   MDD (major depressive disorder), recurrent episode, moderate ([place]) 11/04/2017   Primary osteoarthritis of both hands 09/26/2017   Primary osteoarthritis of right shoulder 09/26/2017   Primary osteoarthritis of both hips 09/26/2017   Status post total  knee replacement, right 09/26/2017   Primary osteoarthritis of both feet 09/26/2017   History of asthma 09/26/2017   Primary osteoarthritis of left knee 04/18/2017   Menorrhagia 11/04/2016   Fibroid tumor 11/04/2016   Adenomyosis 11/04/2016   Hypertension, essential 11/04/2016   Total knee replacement status 01/28/2016   Past Medical History:  Diagnosis Date   Allergy    Anemia    Anxiety    Arthritis    Asthma    Depression    Hypertension     Family History  Problem Relation Age of Onset   Cancer Mother         uterine    Asthma Mother    Arthritis Daughter    Depression Maternal Uncle    Colon cancer Neg Hx     Past Surgical History:  Procedure Laterality Date   CHOLECYSTECTOMY  1993   COLONOSCOPY     TOTAL KNEE ARTHROPLASTY Right 01/28/2016   TOTAL KNEE ARTHROPLASTY Right 01/28/2016   Procedure: RIGHT TOTAL KNEE ARTHROPLASTY;  Surgeon: [name], MD;  Location: [facility];  Service: Orthopedics;  Laterality: Right;   TRIGGER FINGER RELEASE Right 06/18/2014   Procedure: RIGHT LONG FINGER TRIGGER RELEASE;  Surgeon: [name], MD;  Location: [facility];  Service: Orthopedics;  Laterality: Right;   TUBAL LIGATION     VAGINAL HYSTERECTOMY Bilateral 11/04/2016   Procedure: HYSTERECTOMY VAGINAL with Bilateral Salpingectomy;  Surgeon: [name], MD;  Location: [facility];  Service: Gynecology;  Laterality: Bilateral;   WEIL OSTEOTOMY Right 07/07/2020   Procedure: RIGHT FOOT WEIL OSTEOTOMY 2, 3, AND 4 METATARSALS AND PROXIMAL INTERPHALANGEAL JOINT FUSION 2 & 4 TOES;  Surgeon: Newt Minion, MD;  Location: [facility];  Service: Orthopedics;  Laterality: Right;   Social History   Occupational History   Not on file  Tobacco Use   Smoking status: Never   Smokeless tobacco: Never  Vaping Use   Vaping Use: Never used  Substance and Sexual Activity   Alcohol use: No    Alcohol/week: 0.0 standard drinks of alcohol   Drug use: No   Sexual activity: Yes    Partners: Male    Birth control/protection: Surgical

## 2022-07-29 ENCOUNTER — Other Ambulatory Visit: Payer: Self-pay | Admitting: Hematology

## 2022-08-02 ENCOUNTER — Telehealth: Payer: Self-pay | Admitting: Licensed Clinical Social Worker

## 2022-08-02 ENCOUNTER — Encounter: Payer: Self-pay | Admitting: Licensed Clinical Social Worker

## 2022-08-02 NOTE — Progress Notes (Signed)
[facility] PREP Weekly Session  Patient Details  Name: Hannah [name] MRN: [id number] Date of Birth: [birth year] Age: 62 y.o. PCP: [name], MD  Vitals:   08/01/22 1030  Weight: 218 lb (98.9 kg)     [facility] Weekly seesion - 08/02/22 1600       [facility] "PREP" Location   [facility]PREP" [facility]      Weekly Session   Topic Discussed --   portions   Minutes exercised this week 60 minutes    Classes attended to date 12             Barnett Hatter 08/02/2022, 4:22 PM

## 2022-08-02 NOTE — Patient Outreach (Signed)
  Care Coordination   08/02/2022 Name: Hannah [name] MRN: [id number] DOB: Oct 28, 1960   Care Coordination Outreach Attempts:  A second unsuccessful outreach was attempted today to offer the patient with information about available care coordination services as a benefit of their health plan.     Follow Up Plan:  Additional outreach attempts will be made to offer the patient care coordination information and services.   Encounter Outcome:  No Answer   Care Coordination Interventions:  No, not indicated    Christa See, MSW, [email or web address] Phone [phone] 9:49 AM

## 2022-08-11 ENCOUNTER — Encounter: Payer: Self-pay | Admitting: Hematology

## 2022-08-12 ENCOUNTER — Telehealth: Payer: Self-pay | Admitting: Licensed Clinical Social Worker

## 2022-08-12 NOTE — Patient Outreach (Signed)
  Care Coordination   Follow Up Visit Note   08/12/2022 Name: Hannah [name] MRN: [id number] DOB: June 17, 1960  Hannah [name] is a 62 y.o. year old female who sees Tisovec, Fransico Him, MD for primary care. I spoke with  [name] by phone today.  What matters to the patients health and wellness today?  Symptom Managment    Goals Addressed             This Visit's Progress    COMPLETED: Care Coordination-Management of Dep/Anx Symptoms   On track    Activities and task to complete in order to accomplish goals.   Keep all upcoming appointments discussed today Continue with compliance of taking medication prescribed by Doctor Implement healthy coping skills discussed to assist with management of symptoms Continue to participate in PREP to promote well-being Contact [facility] to establish counseling at [place], [place], [place] [address] [phone]         SDOH assessments and interventions completed:  Yes  SDOH Interventions Today    Flowsheet Row Most Recent Value  SDOH Interventions   Food Insecurity Interventions Intervention Not Indicated  Housing Interventions Intervention Not Indicated  Transportation Interventions Intervention Not Indicated        Care Coordination Interventions:  Yes, provided  Interventions Today    Flowsheet Row Most Recent Value  Chronic Disease   Chronic disease during today's visit Hypertension (HTN), Other  [MDD]  General Interventions   General Interventions Discussed/Reviewed General Interventions Reviewed, [employer] reports she is doing well after dental procedure. LCSW assessed for SDOH]  Exercise Interventions   Exercise Discussed/Reviewed Physical Activity, Exercise Reviewed  Physical Activity Discussed/Reviewed PREP  [Patient is enjoying PREP and will continue to participate in program]  Mental Health Interventions   Mental Health Discussed/Reviewed Mental Health  Reviewed, Coping Strategies  [LCSW discussed process to establish services with [facility]. Resources provided via email]       Follow up plan: No further intervention required.   Encounter Outcome:  Pt. Visit Completed   Christa See, MSW, [email or web address] Phone [phone] 1:31 PM'

## 2022-08-12 NOTE — Patient Instructions (Signed)
Visit Information  Thank you for taking time to visit with me today. Please don't hesitate to contact me if I can be of assistance to you.   Following are the goals we discussed today:   Goals Addressed             This Visit's Progress    COMPLETED: Care Coordination-Management of Dep/Anx Symptoms   On track    Activities and task to complete in order to accomplish goals.   Keep all upcoming appointments discussed today Continue with compliance of taking medication prescribed by Doctor Implement healthy coping skills discussed to assist with management of symptoms Continue to participate in PREP to promote well-being Contact [facility] to establish counseling at [place], [place], [place] [address] [phone]         If you are experiencing a Mental Health or [place] or need someone to talk to, please call the Suicide and Crisis Lifeline: 988 call 911   Patient verbalizes understanding of instructions and care plan provided today and agrees to view in [place]. Active MyChart status and patient understanding of how to access instructions and care plan via MyChart confirmed with patient.     No further follow up required: Pt completed all goals associated with LCSW  Christa See, MSW, [email or web address] Phone [phone] 1:32 PM

## 2022-08-17 NOTE — Progress Notes (Signed)
[facility] PREP Weekly Session  Patient Details  Name: Hannah [name] MRN: [id number] Date of Birth: [birth year] Age: 62 y.o. PCP: [name], MD  Vitals:   08/15/22 1030  Weight: 219 lb (99.3 kg)     [facility] Weekly seesion - 08/17/22 0900       [facility] "PREP" Location   [facility]      Weekly Session   Topic Discussed Calorie breakdown    Minutes exercised this week --   not reported   Classes attended to date 14             [facility] 08/17/2022, 9:52 AM

## 2022-09-01 NOTE — Progress Notes (Signed)
[facility] PREP Evaluation  Patient Details  Name: Hannah [name] MRN: [id number] Date of Birth: [birth year] Age: 62 y.o. PCP: [name], MD  Vitals:   09/01/22 1130  BP: 118/78  Pulse: 70  SpO2: 99%  Weight: 215 lb 12.8 oz (97.9 kg)     [facility] Eval - 09/01/22 1700       [facility] "PREP" Location   [facility] "PREP" Location [facility]      Referral    Program Start Date 06/13/22    Program End Date 08/31/22      Measurement   Waist Circumference End Program 45 inches    Hip Circumference End Program 45.5 inches    Body fat 42.9 percent      Mobility and Daily Activities   I find it easy to walk up or down two or more flights of stairs. 2    I have no trouble taking out the trash. 3    I do housework such as vacuuming and dusting on my own without difficulty. 2    I can easily lift a gallon of milk (8lbs). 2    I can easily walk a mile. 2    I have no trouble reaching into high cupboards or reaching down to pick up something from the floor. 1    I do not have trouble doing out-door work such as Armed forces logistics/support/administrative officer, raking leaves, or gardening. 1      Mobility and Daily Activities   I feel younger than my age. 2    I feel independent. 2    I feel energetic. 2    I live an active life.  1    I feel strong. 2    I feel healthy. 2    I feel active as other people my age. 2      How fit and strong are you.   Fit and Strong Total Score 26            Past Medical History:  Diagnosis Date   Allergy    Anemia    Anxiety    Arthritis    Asthma    Depression    Hypertension    Past Surgical History:  Procedure Laterality Date   CHOLECYSTECTOMY  1993   COLONOSCOPY     TOTAL KNEE ARTHROPLASTY Right 01/28/2016   TOTAL KNEE ARTHROPLASTY Right 01/28/2016   Procedure: RIGHT TOTAL KNEE ARTHROPLASTY;  Surgeon: [name], MD;  Location: [facility];  Service: Orthopedics;  Laterality: Right;   TRIGGER FINGER RELEASE Right 06/18/2014   Procedure: RIGHT LONG FINGER TRIGGER  RELEASE;  Surgeon: [name], MD;  Location: [facility];  Service: Orthopedics;  Laterality: Right;   TUBAL LIGATION     VAGINAL HYSTERECTOMY Bilateral 11/04/2016   Procedure: HYSTERECTOMY VAGINAL with Bilateral Salpingectomy;  Surgeon: [name], MD;  Location: [facility];  Service: Gynecology;  Laterality: Bilateral;   WEIL OSTEOTOMY Right 07/07/2020   Procedure: RIGHT FOOT WEIL OSTEOTOMY 2, 3, AND 4 METATARSALS AND PROXIMAL INTERPHALANGEAL JOINT FUSION 2 & 4 TOES;  Surgeon: Newt Minion, MD;  Location: [facility];  Service: Orthopedics;  Laterality: Right;   Social History   Tobacco Use  Smoking Status Never  Smokeless Tobacco Never  Attended: > 15 workouts, 7 educational sessions Fit testing: Cardio march: 191 to 214 Sit to stand: 8 to 10 Bicep curl: 13 to 15 Balance improved. Encouraged to cont to exercise to manage  stress and pre hab prior to shoulder surgery.   Barnett Hatter 09/01/2022, 5:13 PM

## 2022-09-06 NOTE — Progress Notes (Unsigned)
[facility]   Telephone:(336) [phone] Fax:(336) [phone]   Clinic Follow up Note   Patient Care Team: Tisovec, Adelfa Koh, MD as PCP - General (Internal Medicine) Pollyann Savoy, MD as Consulting Physician (Rheumatology) Malachy Mood, MD as Consulting Physician (Hematology) Hoover Browns, MD as Consulting Physician (Obstetrics and Gynecology) Bevelyn Ngo as [facility] Care Management Bridgett Larsson, LCSW as Social Worker (Licensed Clinical Social Worker)  Date of Service:  09/06/2022  CHIEF COMPLAINT: f/u of IgA kappa multiple myeloma     CURRENT THERAPY:  -Maintenance Revlimid 10 mg, days 1-21 q. 28 days started 05/03/21 -Zometa q3months, restarted 05/28/21    ASSESSMENT: *** Hannah [name] is a 62 y.o. female with   No problem-specific Assessment & Plan notes found for this encounter.  ***   PLAN:    SUMMARY OF ONCOLOGIC HISTORY: Oncology History Overview Note  Cancer Staging Multiple myeloma (HCC) Staging form: Plasma Cell Myeloma and Plasma Cell Disorders, AJCC 8th Edition - Clinical stage from 07/20/2020: Beta-2-microglobulin (mg/L): 2.4, Albumin (g/dL): 3.2, ISS: Stage II, High-risk cytogenetics: Unknown, LDH: Normal - Signed by Malachy Mood, MD on 08/02/2020 Beta 2 microglobulin range (mg/L): Less than 3.5 Albumin range (g/dL): Less than 3.5 Cytogenetics: Unknown    Multiple myeloma in remission  07/03/2020 Imaging   MRI Lumbar Spine  IMPRESSION: 1. Numerous T1 hypointense and STIR hyperintense lesions throughout the visualized spine and sacrum with multiple areas of extraosseous extension in the sacrum, detailed above and compatible with multiple myeloma given the clinical history. 2. An MRI of the pelvis with contrast could evaluate the full extent of the partially imaged sacral lesions, including left S1-S2 neural foraminal involvement and suspected involvement of the exited left L5 nerve. 3. Multilevel degenerative change  without significant canal or foraminal stenosis in the lumbar spine.   07/13/2020 Initial Diagnosis   Multiple myeloma (HCC)   07/20/2020 Cancer Staging   Staging form: Plasma Cell Myeloma and Plasma Cell Disorders, AJCC 8th Edition - Clinical stage from 07/20/2020: Beta-2-microglobulin (mg/L): 2.4, Albumin (g/dL): 3.2, ISS: Stage II, High-risk cytogenetics: Unknown, LDH: Normal - Signed by Malachy Mood, MD on 03/09/2022 Stage prefix: Initial diagnosis Beta 2 microglobulin range (mg/L): Less than 3.5 Albumin range (g/dL): Less than 3.5 Cytogenetics: No abnormalities Bone disease on imaging: Present   07/24/2020 PET scan   IMPRESSION: 1. Moderate to high hypermetabolic activity associated with the expansile soft tissue masses within the LEFT and RIGHT pelvic bones is consistent with active multiple myeloma / plasmacytoma. 2. Lytic lesion in the LEFT scapula with mild metabolic activity is indeterminate. 3. Metabolic activity associated with the RIGHT knee prosthetic and RIGHT distal foot fusion is favored post procedural inflammation.     07/30/2020 - 08/12/2020 Radiation Therapy   Palliative Radiation to Pelvic lesions with Dr Mitzi Hansen    07/30/2020 Pathology Results   DIAGNOSIS:   BONE MARROW, ASPIRATE, CLOT, CORE:  -Normocellular to slightly hypercellular bone marrow for age with  plasmacytosis  -See comment   PERIPHERAL BLOOD:  -Microcytic-normochromic anemia   COMMENT:   The bone marrow shows increased number of atypical plasma cells  representing 31% of all cells in the aspirate associated with prominent  interstitial infiltrates and numerous variably sized clusters in the  clot and biopsy sections.  The findings are most consistent with  persistent/recurrent/previously known plasma cell neoplasm.  For  completeness, immunohistochemical stain for CD138 and in situ  hybridization for kappa and lambda will be performed and the results  reported  in an addendum.  Correlation with  cytogenetic and FISH studies  is recommended.   08/03/2020 -  Chemotherapy   Zometa q4weeks starting 08/03/20    08/17/2020 - 11/29/2020 Chemotherapy   Velcade weekly, Revlimid, dex  weekly (VRd)  Starting 08/17/20. Last dose Velcade 11/23/20 and last dose revlimid on 11/29/20       INTERVAL HISTORY: *** Hannah [name] is here for a follow up of IgA kappa multiple myeloma    She was last seen by me on 06/10/2022 She presents to the clinic    All other systems were reviewed with the patient and are negative.  MEDICAL HISTORY:  Past Medical History:  Diagnosis Date   Allergy    Anemia    Anxiety    Arthritis    Asthma    Depression    Hypertension     SURGICAL HISTORY: Past Surgical History:  Procedure Laterality Date   CHOLECYSTECTOMY  1993   COLONOSCOPY     TOTAL KNEE ARTHROPLASTY Right 01/28/2016   TOTAL KNEE ARTHROPLASTY Right 01/28/2016   Procedure: RIGHT TOTAL KNEE ARTHROPLASTY;  Surgeon: Tarry Kos, MD;  Location[facility];  Service: Orthopedics;  Laterality: Right;   TRIGGER FINGER RELEASE Right 06/18/2014   Procedure: RIGHT LONG FINGER TRIGGER RELEASE;  Surgeon: Cheral Almas, MD;  Location: [facility];  Service: Orthopedics;  Laterality: Right;   TUBAL LIGATION     VAGINAL HYSTERECTOMY Bilateral 11/04/2016   Procedure: HYSTERECTOMY VAGINAL with Bilateral Salpingectomy;  Surgeon: Hal Morales, MD;  Location: [facility];  Service: Gynecology;  Laterality: Bilateral;   WEIL OSTEOTOMY Right 07/07/2020   Procedure: RIGHT FOOT WEIL OSTEOTOMY 2, 3, AND 4 METATARSALS AND PROXIMAL INTERPHALANGEAL JOINT FUSION 2 & 4 TOES;  Surgeon: Nadara Mustard, MD;  Location: [facility];  Service: Orthopedics;  Laterality: Right;    I have reviewed the social history and family history with the patient and they are unchanged from previous note.  ALLERGIES:  is allergic to elemental sulfur and sulfa antibiotics.  MEDICATIONS:  Current Outpatient Medications   Medication Sig Dispense Refill   aspirin EC 81 MG tablet Take 81 mg by mouth daily. Swallow whole.     calcium carbonate (OSCAL) 1500 (600 Ca) MG TABS tablet Take 600 mg of elemental calcium by mouth daily.     Cholecalciferol (VITAMIN D3) 1.25 MG (50000 UT) CAPS Take 1 capsule by mouth once a week.     ferrous sulfate 325 (65 FE) MG tablet Take 325 mg by mouth 2 (two) times daily with a meal.     folic acid (FOLVITE) 1 MG tablet folic acid 1 mg tablet  TAKE 1 TABLET BY MOUTH EVERY DAY     naproxen sodium (ALEVE) 220 MG tablet as needed.     REVLIMID 10 MG capsule TAKE 1 CAPSULE BY MOUTH 1 TIME A DAY FOR 21 DAYS ON AND 7 DAYS OFF [employer] ZOXW#[id number] Effective:07/29/2022 - Generic can be substituted with Brand and Brand can be substituted with Generic. 21 capsule 0   valACYclovir (VALTREX) 500 MG tablet Take 500 mg by mouth 2 (two) times daily.     vitamin C (ASCORBIC ACID) 500 MG tablet      No current facility-administered medications for this visit.    PHYSICAL EXAMINATION: ECOG PERFORMANCE STATUS: {CHL ONC ECOG [phone]}  There were no vitals filed for this visit. Wt Readings from Last 3 Encounters:  09/01/22 215 lb 12.8 oz (97.9 kg)  08/15/22  219 lb (99.3 kg)  08/01/22 218 lb (98.9 kg)    {Only keep what was examined. If exam not performed, can use .CEXAM } GENERAL:alert, no distress and comfortable SKIN: skin color, texture, turgor are normal, no rashes or significant lesions EYES: normal, Conjunctiva are pink and non-injected, sclera clear {OROPHARYNX:no exudate, no erythema and lips, buccal mucosa, and tongue normal}  NECK: supple, thyroid normal size, non-tender, without nodularity LYMPH:  no palpable lymphadenopathy in the cervical, axillary {or inguinal} LUNGS: clear to auscultation and percussion with normal breathing effort HEART: regular rate & rhythm and no murmurs and no lower extremity edema ABDOMEN:abdomen soft, non-tender and normal bowel  sounds Musculoskeletal:no cyanosis of digits and no clubbing  NEURO: alert & oriented x 3 with fluent speech, no focal motor/sensory deficits  LABORATORY DATA:  I have reviewed the data as listed    Latest Ref Rng & Units 07/19/2022    9:04 AM 06/10/2022    9:54 AM 03/09/2022    8:10 AM  CBC  WBC 4.0 - 10.5 K/uL 5.9  6.5  6.3   Hemoglobin 12.0 - 15.0 g/dL 16.111.5  09.611.7  04.511.5   Hematocrit 36.0 - 46.0 % 34.2  35.2  35.0   Platelets 150 - 400 K/uL 169  155  165         Latest Ref Rng & Units 07/19/2022    9:04 AM 06/10/2022    9:54 AM 03/09/2022    8:10 AM  CMP  Glucose 70 - 99 mg/dL 409102  90  811102   BUN 6 - 20 mg/dL 17  11  12    Creatinine 0.44 - 1.00 mg/dL 9.140.75  7.820.73  9.560.82   Sodium 135 - 145 mmol/L 141  141  141   Potassium 3.5 - 5.1 mmol/L 4.0  3.9  3.6   Chloride 98 - 111 mmol/L 107  109  106   CO2 22 - 32 mmol/L 29  28  29    Calcium 8.9 - 10.3 mg/dL 8.6  8.8  8.5   Total Protein 6.5 - 8.1 g/dL 6.9  6.8  7.1   Total Bilirubin 0.3 - 1.2 mg/dL 0.3  0.3  0.5   Alkaline Phos 38 - 126 U/L 79  87  71   AST 15 - 41 U/L 14  11  12    ALT 0 - 44 U/L 9  11  9        RADIOGRAPHIC STUDIES: I have personally reviewed the radiological images as listed and agreed with the findings in the report. No results found.    No orders of the defined types were placed in this encounter.  All questions were answered. The patient knows to call the clinic with any problems, questions or concerns. No barriers to learning was detected. The total time spent in the appointment was {CHL ONC TIME VISIT - OZHYQ:[id number]}SWIFT:[phone]}.     Salome HolmesLaChelle N Koji Niehoff, CMA 09/06/2022   I, Monica MartinezLaChelle Romeo Zielinski, CMA, am acting as scribe for Malachy MoodYan Feng, MD.   {Add scribe attestation statement}

## 2022-09-06 NOTE — Assessment & Plan Note (Addendum)
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   -bone marrow biopsy on 07/30/20 showed slightly hypercellular marrow for age and 31% plasma cells. Cytogenetics and FISH were unremarkable -she received VRD (weekly Velcade and dexamethasone, Revlimid 2 weeks on 1 week off) 08/17/20 - June 2022. She responded well. -She underwent high-dose chemo with melphalan and autologous stem cell transplant at [facility] 12/23/20 by Dr. Rosaria Ferries.  White cells engrafted 01/03/21 and platelets 01/13/21, last platelet transfusion 01/02/21 -posttreatment PET 04/06/21 showed NED  -posttreatment bone marrow biopsy 04/29/21 showed 10-20% hypocellular marrow with myeloid hypoplasia and 3% plasma cells -s/p prophylactic valacyclovir for 1 year and dapsone for 6 months posttransplant -she will continue revaccination per [facility] protocol -she began maintenance Revlimid 10 mg daily days 1-21 q. 28 days on 05/03/21. She is tolerating well with no noticeable side effects. will continue indefinitely or until disease progression. -Her last MM panel showed negative M-protein, but slightly increase Kappa light chain level. -Will proceed last scheduled Zometa today, she has completed a total of 2 years therapy.

## 2022-09-06 NOTE — Assessment & Plan Note (Signed)
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  She is aware her children are at risk of hemoglobin C trait  

## 2022-09-07 ENCOUNTER — Encounter: Payer: Self-pay | Admitting: Hematology

## 2022-09-07 ENCOUNTER — Other Ambulatory Visit: Payer: Self-pay

## 2022-09-07 ENCOUNTER — Inpatient Hospital Stay: Payer: BC Managed Care – PPO

## 2022-09-07 ENCOUNTER — Other Ambulatory Visit: Payer: Self-pay | Admitting: Hematology

## 2022-09-07 ENCOUNTER — Inpatient Hospital Stay: Payer: BC Managed Care – PPO | Attending: Hematology | Admitting: Hematology

## 2022-09-07 VITALS — BP 142/88 | HR 80 | Temp 98.5°F | Resp 18 | Ht 66.0 in | Wt 220.1 lb

## 2022-09-07 DIAGNOSIS — D509 Iron deficiency anemia, unspecified: Secondary | ICD-10-CM | POA: Insufficient documentation

## 2022-09-07 DIAGNOSIS — Z9079 Acquired absence of other genital organ(s): Secondary | ICD-10-CM | POA: Insufficient documentation

## 2022-09-07 DIAGNOSIS — C9001 Multiple myeloma in remission: Secondary | ICD-10-CM

## 2022-09-07 DIAGNOSIS — M160 Bilateral primary osteoarthritis of hip: Secondary | ICD-10-CM | POA: Insufficient documentation

## 2022-09-07 DIAGNOSIS — Z9484 Stem cells transplant status: Secondary | ICD-10-CM | POA: Diagnosis not present

## 2022-09-07 DIAGNOSIS — I10 Essential (primary) hypertension: Secondary | ICD-10-CM | POA: Diagnosis not present

## 2022-09-07 DIAGNOSIS — Z9049 Acquired absence of other specified parts of digestive tract: Secondary | ICD-10-CM | POA: Insufficient documentation

## 2022-09-07 DIAGNOSIS — Z79899 Other long term (current) drug therapy: Secondary | ICD-10-CM | POA: Insufficient documentation

## 2022-09-07 DIAGNOSIS — M47816 Spondylosis without myelopathy or radiculopathy, lumbar region: Secondary | ICD-10-CM | POA: Diagnosis not present

## 2022-09-07 DIAGNOSIS — Z882 Allergy status to sulfonamides status: Secondary | ICD-10-CM | POA: Diagnosis not present

## 2022-09-07 DIAGNOSIS — Z7961 Long term (current) use of immunomodulator: Secondary | ICD-10-CM | POA: Insufficient documentation

## 2022-09-07 DIAGNOSIS — D582 Other hemoglobinopathies: Secondary | ICD-10-CM | POA: Diagnosis not present

## 2022-09-07 DIAGNOSIS — M47817 Spondylosis without myelopathy or radiculopathy, lumbosacral region: Secondary | ICD-10-CM | POA: Diagnosis not present

## 2022-09-07 DIAGNOSIS — C9000 Multiple myeloma not having achieved remission: Secondary | ICD-10-CM

## 2022-09-07 LAB — CMP (CANCER CENTER ONLY)
ALT: 8 U/L (ref 0–44)
AST: 11 U/L — ABNORMAL LOW (ref 15–41)
Albumin: 4 g/dL (ref 3.5–5.0)
Alkaline Phosphatase: 69 U/L (ref 38–126)
Anion gap: 5 (ref 5–15)
BUN: 16 mg/dL (ref 6–20)
CO2: 30 mmol/L (ref 22–32)
Calcium: 9.4 mg/dL (ref 8.9–10.3)
Chloride: 106 mmol/L (ref 98–111)
Creatinine: 0.88 mg/dL (ref 0.44–1.00)
GFR, Estimated: >60 mL/min (ref >60)
Glucose, Bld: 96 mg/dL (ref 70–99)
Potassium: 3.8 mmol/L (ref 3.5–5.1)
Sodium: 141 mmol/L (ref 135–145)
Total Bilirubin: 0.4 mg/dL (ref 0.3–1.2)
Total Protein: 7.2 g/dL (ref 6.5–8.1)

## 2022-09-07 LAB — CBC WITH DIFFERENTIAL (CANCER CENTER ONLY)
Abs Immature Granulocytes: 0.03 10*3/uL (ref 0.00–0.07)
Basophils Absolute: 0 10*3/uL (ref 0.0–0.1)
Basophils Relative: 0 %
Eosinophils Absolute: 0.2 10*3/uL (ref 0.0–0.5)
Eosinophils Relative: 4 %
HCT: 35.1 % — ABNORMAL LOW (ref 36.0–46.0)
Hemoglobin: 11.6 g/dL — ABNORMAL LOW (ref 12.0–15.0)
Immature Granulocytes: 1 %
Lymphocytes Relative: 35 %
Lymphs Abs: 1.7 10*3/uL (ref 0.7–4.0)
MCH: 25.1 pg — ABNORMAL LOW (ref 26.0–34.0)
MCHC: 33 g/dL (ref 30.0–36.0)
MCV: 76 fL — ABNORMAL LOW (ref 80.0–100.0)
Monocytes Absolute: 0.5 10*3/uL (ref 0.1–1.0)
Monocytes Relative: 9 %
Neutro Abs: 2.6 10*3/uL (ref 1.7–7.7)
Neutrophils Relative %: 51 %
Platelet Count: 188 10*3/uL (ref 150–400)
RBC: 4.62 MIL/uL (ref 3.87–5.11)
RDW: 15.2 % (ref 11.5–15.5)
WBC Count: 5 10*3/uL (ref 4.0–10.5)
nRBC: 0 % (ref 0.0–0.2)

## 2022-09-07 LAB — IRON AND IRON BINDING CAPACITY (CC-WL,HP ONLY)
Iron: 46 ug/dL (ref 28–170)
Saturation Ratios: 18 % (ref 10.4–31.8)
TIBC: 259 ug/dL (ref 250–450)
UIBC: 213 ug/dL (ref 148–442)

## 2022-09-07 MED ORDER — SODIUM CHLORIDE 0.9 % IV SOLN: Freq: Once | INTRAVENOUS | Status: AC

## 2022-09-07 MED ORDER — ZOLEDRONIC ACID 4 MG/100ML IV SOLN
4.0000 mg | Freq: Once | INTRAVENOUS | Status: AC
  Administered 2022-09-07: 4 mg via INTRAVENOUS
  Filled 2022-09-07: qty 100

## 2022-09-07 MED ORDER — REVLIMID 10 MG PO CAPS: ORAL_CAPSULE | ORAL | 2 refills | Status: DC

## 2022-09-07 NOTE — Progress Notes (Signed)
Pt had tooth extraction on 08/11/2022. Dr. Mosetta Putt aware. Per MD OK to proceed with Zometa today.

## 2022-09-07 NOTE — Addendum Note (Signed)
Addended by: Amaryllis Dyke on: 09/07/2022 03:05 PM   Modules accepted: Orders

## 2022-09-07 NOTE — Patient Instructions (Signed)

## 2022-09-08 LAB — KAPPA/LAMBDA LIGHT CHAINS
Kappa free light chain: 29.1 mg/L — ABNORMAL HIGH (ref 3.3–19.4)
Kappa, lambda light chain ratio: 1.81 — ABNORMAL HIGH (ref 0.26–1.65)
Lambda free light chains: 16.1 mg/L (ref 5.7–26.3)

## 2022-09-09 ENCOUNTER — Other Ambulatory Visit: Payer: Self-pay | Admitting: Hematology

## 2022-09-09 MED ORDER — REVLIMID 10 MG PO CAPS: ORAL_CAPSULE | ORAL | 2 refills | Status: DC

## 2022-09-11 LAB — MULTIPLE MYELOMA PANEL, SERUM
Albumin SerPl Elph-Mcnc: 3.5 g/dL (ref 2.9–4.4)
Albumin/Glob SerPl: 1.3 (ref 0.7–1.7)
Alpha 1: 0.2 g/dL (ref 0.0–0.4)
Alpha2 Glob SerPl Elph-Mcnc: 0.6 g/dL (ref 0.4–1.0)
B-Globulin SerPl Elph-Mcnc: 0.9 g/dL (ref 0.7–1.3)
Gamma Glob SerPl Elph-Mcnc: 1.2 g/dL (ref 0.4–1.8)
Globulin, Total: 2.9 g/dL (ref 2.2–3.9)
IgA: 161 mg/dL (ref 87–352)
IgG (Immunoglobin G), Serum: 1398 mg/dL (ref 586–1602)
IgM (Immunoglobulin M), Srm: 24 mg/dL — ABNORMAL LOW (ref 26–217)
Total Protein ELP: 6.4 g/dL (ref 6.0–8.5)

## 2022-09-12 ENCOUNTER — Encounter: Payer: Self-pay | Admitting: Orthopedic Surgery

## 2022-09-14 DIAGNOSIS — D509 Iron deficiency anemia, unspecified: Secondary | ICD-10-CM | POA: Diagnosis not present

## 2022-09-14 DIAGNOSIS — R7989 Other specified abnormal findings of blood chemistry: Secondary | ICD-10-CM | POA: Diagnosis not present

## 2022-09-14 DIAGNOSIS — Z1212 Encounter for screening for malignant neoplasm of rectum: Secondary | ICD-10-CM | POA: Diagnosis not present

## 2022-09-14 DIAGNOSIS — I10 Essential (primary) hypertension: Secondary | ICD-10-CM | POA: Diagnosis not present

## 2022-09-21 DIAGNOSIS — J4540 Moderate persistent asthma, uncomplicated: Secondary | ICD-10-CM | POA: Diagnosis not present

## 2022-09-21 DIAGNOSIS — R82998 Other abnormal findings in urine: Secondary | ICD-10-CM | POA: Diagnosis not present

## 2022-09-21 DIAGNOSIS — Z1339 Encounter for screening examination for other mental health and behavioral disorders: Secondary | ICD-10-CM | POA: Diagnosis not present

## 2022-09-21 DIAGNOSIS — Z0000 Encounter for general adult medical examination without abnormal findings: Secondary | ICD-10-CM | POA: Diagnosis not present

## 2022-09-21 DIAGNOSIS — F3341 Major depressive disorder, recurrent, in partial remission: Secondary | ICD-10-CM | POA: Diagnosis not present

## 2022-09-21 DIAGNOSIS — C9000 Multiple myeloma not having achieved remission: Secondary | ICD-10-CM | POA: Diagnosis not present

## 2022-09-21 DIAGNOSIS — I10 Essential (primary) hypertension: Secondary | ICD-10-CM | POA: Diagnosis not present

## 2022-09-21 DIAGNOSIS — Z1331 Encounter for screening for depression: Secondary | ICD-10-CM | POA: Diagnosis not present

## 2022-09-23 ENCOUNTER — Other Ambulatory Visit: Payer: Self-pay

## 2022-09-28 NOTE — Progress Notes (Signed)
FMLA forms completed as requested by Patient. Fax transmission confirmation received. Copy of forms mailed to Patient as requested. No other needs or concerns noted at this time.

## 2022-10-10 ENCOUNTER — Telehealth: Payer: Self-pay

## 2022-10-10 ENCOUNTER — Ambulatory Visit (HOSPITAL_COMMUNITY)
Admission: EM | Admit: 2022-10-10 | Discharge: 2022-10-10 | Disposition: A | Discharge status: Home / Self Care | Payer: BC Managed Care – PPO

## 2022-10-10 ENCOUNTER — Ambulatory Visit: Payer: Self-pay

## 2022-10-10 DIAGNOSIS — F411 Generalized anxiety disorder: Secondary | ICD-10-CM

## 2022-10-10 DIAGNOSIS — F339 Major depressive disorder, recurrent, unspecified: Secondary | ICD-10-CM | POA: Diagnosis not present

## 2022-10-10 DIAGNOSIS — Z91148 Patient's other noncompliance with medication regimen for other reason: Secondary | ICD-10-CM

## 2022-10-10 NOTE — Patient Instructions (Signed)
Visit Information  Thank you for taking time to visit with me today. Please don't hesitate to contact me if I can be of assistance to you.   Following are the goals we discussed today:  -Go to [address]    If you are experiencing a Mental Health or Behavioral Health Crisis or need someone to talk to, please go to [facility] [address], [place] [phone]) call 911  Patient verbalizes understanding of instructions and care plan provided today and agrees to view in MyChart. Active MyChart status and patient understanding of how to access instructions and care plan via MyChart confirmed with patient.     Bevelyn Ngo, BSW, CDP Social Worker, Certified Dementia Practitioner [facility] Care Management  Care Coordination [phone]

## 2022-10-10 NOTE — ED Provider Notes (Signed)
Behavioral Health Urgent Care Medical Screening Exam  Patient Name: Hannah [name] MRN: [id number] Date of Evaluation: 10/10/22 Chief Complaint:   Diagnosis:  Final diagnoses:  GAD (generalized anxiety disorder)  Recurrent major depressive disorder, remission status unspecified (HCC)  H/O medication noncompliance    History of Present illness: Hannah [name] is a 62 y.o. female.  With a history of major depressive session, and anxiety presented to [facility] voluntarily.  According to the patient she had a breakdown at work however patient works from home she says she has been dealing with a lot of life stressors which she did not want to elaborate on.  Patient stated I am just isolated and my mood is up and down and I am sleeping a lot.  According to the patient she was taking medication for anxiety and depression but she stop taking them because she was diagnose with myeloma and she didn't want to be taking too much medication.  According to patient she is not currently seeing a psychiatrist or therapist.  She live at home with her Husband and son,  pt works Clinical biochemist for a business.    According to patient she was referred to [place] by her PCP.  According to patient she is looking for outpatient therapy.  When asked if therapy had worked for her in the past patient stated yes.  Face-to-face observation of patient, patient is alert and oriented x 4, speech is clear, maintained minimal eye contact throughout the interview process patient had her head down picking had her nails.  Patient is not forthcoming with information when asked questions patient stated check my chart.  Patient denies SI, HI, AVH or paranoia at this time.  Patient denies alcohol use.  Patient denies illicit drug use or smoking.  Writer collaborate with social worker to provide patient with outpatient resources.  Patient is educated and verbalized understanding of mental health resources and other crisis service in the community.   She is instructed to call 911 and present to the nearest emergency room should she experience any suicide or homicidal ideation, auditory hallucination, or detrimental worsening of her mental condition. Upon completion of this assessment patient was both mentally and medically stable for discharge.   Recommend discharge for patient to follow-up with outpatient resources provided.  Flowsheet Row ED from 10/10/2022 in [facility] Admission (Discharged) from 07/07/2020 in [facility]  C-SSRS RISK CATEGORY No Risk No Risk       Psychiatric Specialty Exam  Presentation  General Appearance:Casual  Eye Contact:Fleeting  Speech:Clear and Coherent  Speech Volume:Normal  Handedness:Right   Mood and Affect  Mood: Anxious  Affect: Constricted   Thought Process  Thought Processes: Coherent  Descriptions of Associations:Intact  Orientation:Full (Time, Place and Person)  Thought Content:WDL    Hallucinations:None  Ideas of Reference:None  Suicidal Thoughts:No  Homicidal Thoughts:No   Sensorium  Memory: Immediate Fair  Judgment: Fair  Insight: Fair   Art therapist  Concentration: Fair  Attention Span: Fair  Recall: [employer] of Knowledge: Fair  Language: Fair   Psychomotor Activity  Psychomotor Activity: Normal   Assets  Assets: Desire for Improvement   Sleep  Sleep: Fair  Number of hours:  5   Physical Exam: Physical Exam HENT:     Head: Normocephalic.     Nose: Nose normal.  Cardiovascular:     Rate and Rhythm: Normal rate.  Pulmonary:     Effort: Pulmonary effort is normal.  Musculoskeletal:  General: Normal range of motion.     Cervical back: Normal range of motion.  Neurological:     General: No focal deficit present.     Mental Status: She is alert.  Psychiatric:        Mood and Affect: Mood normal.    Review of Systems  Constitutional: Negative.   HENT: Negative.     Eyes: Negative.   Respiratory: Negative.    Cardiovascular: Negative.   Gastrointestinal: Negative.   Genitourinary: Negative.   Musculoskeletal: Negative.   Skin: Negative.   Neurological: Negative.   Psychiatric/Behavioral:  Positive for depression. The patient is nervous/anxious.    Blood pressure (!) 157/97, pulse 87, temperature 98 F (36.7 C), temperature source Oral, resp. rate 18, last menstrual period 10/22/2016, SpO2 99 %. There is no height or weight on file to calculate BMI.  Musculoskeletal: Strength & Muscle Tone: within normal limits Gait & Station: normal Patient leans: N/A   [facility] MSE Discharge Disposition for Follow up and Recommendations: Based on my evaluation the patient does not appear to have an emergency medical condition and can be discharged with resources and follow up care in outpatient services for Individual Therapy   Sindy Guadeloupe, NP 10/10/2022, 8:32 PM

## 2022-10-10 NOTE — Patient Outreach (Signed)
  Care Coordination   10/10/2022 Name: Hannah [name] MRN: [id number] DOB: [birth year]   Care Coordination Outreach Attempts:  SW placed an unsuccessful outbound call to the patient to check on her. HIPAA compliant voice message was left requesting a return call.  Follow Up Plan:  A member of the care coordination team will attempt to contact the patient over the next day.  Encounter Outcome:  No Answer   Care Coordination Interventions:  No, not indicated    Bevelyn Ngo, BSW, CDP Social Worker, Certified Dementia Practitioner [facility] Care Management  Care Coordination [phone]

## 2022-10-10 NOTE — Progress Notes (Signed)
   10/10/22 1833  [facility] Triage Screening (Walk-ins at [facility] only)  How Did You Hear About [place]? Self  What Is the Reason for Your Visit/Call Today? Pt presents to [facility] voluntarily seeking a mental health evaluation. Pt was referred by her PCP [facility] due to increased anxiety. Pt reports stress from her job and family. Pt states she is interested in outpatien therapy resources.Pt denies SI/HI and AVH.  How Long Has This Been Causing You Problems? <Week  Have You Recently Had Any Thoughts About Hurting Yourself? No  Are You Planning to Commit Suicide/Harm Yourself At This time? No  Have you Recently Had Thoughts About Hurting Someone Karolee Ohs? No  Are You Planning To Harm Someone At This Time? No  Are you currently experiencing any auditory, visual or other hallucinations? No  Have You Used Any Alcohol or Drugs in the Past 24 Hours? No  Do you have any current medical co-morbidities that require immediate attention? No  Clinician description of patient physical appearance/behavior: calm ,cooperative, well groomed  What Do You Feel Would Help You the Most Today? Treatment for Depression or other mood problem;Social Support  If access to [facility] was not available, would you have sought care in the Emergency Department? No  Determination of Need Routine (7 days)  Options For Referral Outpatient Therapy;Medication Management

## 2022-10-10 NOTE — Patient Outreach (Signed)
  Care Coordination   Follow Up Visit Note   10/10/2022 Name: Hannah [name] MRN: [id number] DOB: 05/07/1961  Hannah [name] is a 62 y.o. year old female who sees Tisovec, Adelfa Koh, MD for primary care. I spoke with  Maricela Curet by phone today.  What matters to the patients health and wellness today?  Patient is requesting mental health support    Goals Addressed             This Visit's Progress    COMPLETED: Care Coordination Activities       Care Coordination Interventions: Inbound call received from the patient who reports "I need help" Determined the patient is interested in engaging with therapy; patient states "I may need to be evaluated today" Confirmed the patient had access to transportation and felt safe driving  Instructed the patient to go to [facility] at [address]. Advised the patient this SW would request LCSW Jenel Lucks contact her to assist with ongoing MH support Collaboration with LCSW requesting patient follow up         SDOH assessments and interventions completed:  No     Care Coordination Interventions:  Yes, provided   Interventions Today    Flowsheet Row Most Recent Value  Chronic Disease   Chronic disease during today's visit Other  Mental Health Interventions   Mental Health Discussed/Reviewed Mental Health Discussed, Other  [Referral to [facility],  Communication with LCSW]        Follow up plan:  A member of the care coordination team with follow up with the patient.    Encounter Outcome:  Pt. Visit Completed   Bevelyn Ngo, BSW, CDP Social Worker, Certified Dementia Practitioner [facility] Care Management  Care Coordination [phone]

## 2022-10-10 NOTE — Discharge Instructions (Signed)
F/u with out patient therapy, and resources provided

## 2022-10-13 ENCOUNTER — Ambulatory Visit: Payer: Self-pay

## 2022-10-13 NOTE — Patient Outreach (Signed)
  Care Coordination   Follow Up Visit Note   10/13/2022 Name: Hannah [name] MRN: [id number] DOB: [birth year]  BERNARDA HARTLAUB is a 62 y.o. year old female who sees Tisovec, Adelfa Koh, MD for primary care. I spoke with  Maricela Curet by phone today.  What matters to the patients health and wellness today?  Patient would like to speak with a clinician for mental health support related to anxiety.    Goals Addressed             This Visit's Progress    COMPLETED: Care Coordination Activities       Care Coordination Interventions: Contacted the patient to assess for ongoing care coordination needs Discussed the patient did visit the behavioral health urgent care and did not receive mental health support like she had hoped  Advised the patient writer could schedule a telephonic visit with LCSW for mental health support related to anxiety; visit scheduled for 5/22 Encouraged the patient to contact care coordination team as needed Collaboration with LCSW to advise of scheduled visit and plan for patient to engage with LCSW for ongoing support         SDOH assessments and interventions completed:  No     Care Coordination Interventions:  Yes, provided   Interventions Today    Flowsheet Row Most Recent Value  Chronic Disease   Chronic disease during today's visit Other  General Interventions   General Interventions Discussed/Reviewed Communication with  Communication with Social Work  Mental Health Interventions   Mental Health Discussed/Reviewed Anxiety, Refer to Social Work for counseling  Refer to Social Work for counseling regarding Anxiety/Coping        Follow up plan: Follow up call scheduled for 5/22 at 10:00 am with LCSW Jenel Lucks. Collaboration with LCSW to advise of schedule appointment with patient.    Encounter Outcome:  Pt. Visit Completed   Bevelyn Ngo, BSW, CDP Social Worker, Certified Dementia Practitioner [facility] Care Management  Care  Coordination [phone]

## 2022-10-13 NOTE — Patient Instructions (Signed)
Visit Information  Thank you for taking time to visit with me today. Please don't hesitate to contact me if I can be of assistance to you.   Following are the goals we discussed today:  -Engage with LCSW during schedule call on 5/22 -Contact your primary care provider as needed -Contact the care coordination team if needed prior to next scheduled call  Your next appointment is by telephone on 5/22 at 10:00 with LCSW Jenel Lucks  Please call the care guide team at [phone] if you need to cancel or reschedule your appointment.   If you are experiencing a Mental Health or Behavioral Health Crisis or need someone to talk to, please call the Suicide and Crisis Lifeline: 988 go to [facility] Urgent [facility] [address] [place] [phone]) call 911  Patient verbalizes understanding of instructions and care plan provided today and agrees to view in MyChart. Active MyChart status and patient understanding of how to access instructions and care plan via MyChart confirmed with patient.     Bevelyn Ngo, BSW, CDP Social Worker, Certified Dementia Practitioner [facility] Care Management  Care Coordination [phone]

## 2022-10-14 ENCOUNTER — Other Ambulatory Visit: Payer: Self-pay

## 2022-10-14 MED ORDER — LENALIDOMIDE 10 MG PO CAPS: ORAL_CAPSULE | ORAL | 0 refills | Status: DC

## 2022-10-19 ENCOUNTER — Ambulatory Visit: Payer: Self-pay | Admitting: Licensed Clinical Social Worker

## 2022-10-20 ENCOUNTER — Telehealth: Payer: Self-pay | Admitting: Licensed Clinical Social Worker

## 2022-10-20 NOTE — Patient Outreach (Signed)
  Care Coordination   Follow Up Visit Note   10/20/2022 Name: Hannah [name] MRN: [id number] DOB: Aug 03, 1960  Hannah [name] is a 62 y.o. year old female who sees Tisovec, Adelfa Koh, MD for primary care. I spoke with  Maricela Curet by phone today.  What matters to the patients health and wellness today?  Symptom Management    Goals Addressed             This Visit's Progress    Care Coordination-Management of Dep/Anx Symptoms   On track    Activities and task to complete in order to accomplish goals.   Keep all upcoming appointments discussed today Continue with compliance of taking medication prescribed by Doctor Implement healthy coping skills discussed to assist with management of symptoms Contact [facility] to establish counseling at [address], [place], [place] [address] [phone]         SDOH assessments and interventions completed:  No     Care Coordination Interventions:  Yes, provided  Interventions Today    Flowsheet Row Most Recent Value  Chronic Disease   Chronic disease during today's visit Hypertension (HTN)  General Interventions   General Interventions Discussed/Reviewed General Interventions Reviewed, [facility], Doctor Visits  Doctor Visits Discussed/Reviewed Doctor Visits Reviewed  Mental Health Interventions   Mental Health Discussed/Reviewed Mental Health Reviewed, Coping Strategies, Anxiety       Follow up plan: Follow up call scheduled for 2-4 weeks    Encounter Outcome:  Pt. Visit Completed   Jenel Lucks, MSW, LCSW [facility] Care Management [facility]  [facility] [email or web address] Phone [phone] 6:25 PM

## 2022-10-20 NOTE — Patient Instructions (Signed)
Visit Information  Thank you for taking time to visit with me today. Please don't hesitate to contact me if I can be of assistance to you.   Following are the goals we discussed today:   Goals Addressed             This Visit's Progress    Care Coordination-Management of Dep/Anx Symptoms   On track    Activities and task to complete in order to accomplish goals.   Keep all upcoming appointments discussed today Continue with compliance of taking medication prescribed by Doctor Implement healthy coping skills discussed to assist with management of symptoms Contact [facility] to establish counseling at [address], [place], [place] [address] [phone]         Please call the care guide team at [phone] if you need to cancel or reschedule your appointment.   If you are experiencing a Mental Health or Behavioral Health Crisis or need someone to talk to, please call the Suicide and Crisis Lifeline: 988 call 911   Patient verbalizes understanding of instructions and care plan provided today and agrees to view in MyChart. Active MyChart status and patient understanding of how to access instructions and care plan via MyChart confirmed with patient.     The care management team will reach out to the patient again over the next 7  days.   Jenel Lucks, MSW, LCSW [name] Care Management [place]  [facility] [email or web address] Phone [phone] 6:25 PM

## 2022-10-20 NOTE — Patient Outreach (Signed)
  Care Coordination   Follow Up Visit Note   10/19/2022 Name: Hannah [name] MRN: [id number] DOB: 05/21/1961  Hannah [name] is a 62 y.o. year old female who sees Tisovec, Adelfa Koh, MD for primary care. I spoke with  Hannah [name] by phone today.  What matters to the patients health and wellness today?  Would like to r/s appt   SDOH assessments and interventions completed:  No     Care Coordination Interventions:  Yes, provided   Follow up plan:  Patient agreed to call back at a later time    Encounter Outcome:  Pt. Request to Call Back   Jenel Lucks, MSW, LCSW [facility] Care Management [facility]  [facility] [email or web address] Phone [phone] 4:36 PM

## 2022-11-04 ENCOUNTER — Other Ambulatory Visit: Payer: Self-pay | Admitting: Hematology

## 2022-11-07 ENCOUNTER — Other Ambulatory Visit: Payer: Self-pay | Admitting: Hematology

## 2022-11-08 ENCOUNTER — Other Ambulatory Visit: Payer: Self-pay

## 2022-11-08 MED ORDER — LENALIDOMIDE 10 MG PO CAPS: ORAL_CAPSULE | ORAL | 0 refills | Status: DC

## 2022-11-24 ENCOUNTER — Encounter (HOSPITAL_BASED_OUTPATIENT_CLINIC_OR_DEPARTMENT_OTHER): Payer: Self-pay

## 2022-11-24 ENCOUNTER — Ambulatory Visit (INDEPENDENT_AMBULATORY_CARE_PROVIDER_SITE_OTHER): Payer: BC Managed Care – PPO | Admitting: Orthopedic Surgery

## 2022-11-24 ENCOUNTER — Ambulatory Visit
Admission: RE | Admit: 2022-11-24 | Discharge: 2022-11-24 | Disposition: A | Discharge status: Home / Self Care | Payer: BC Managed Care – PPO | Source: Ambulatory Visit | Attending: Orthopedic Surgery | Admitting: Orthopedic Surgery

## 2022-11-24 ENCOUNTER — Telehealth: Payer: Self-pay

## 2022-11-24 ENCOUNTER — Encounter: Payer: Self-pay | Admitting: Orthopedic Surgery

## 2022-11-24 DIAGNOSIS — M25511 Pain in right shoulder: Secondary | ICD-10-CM | POA: Diagnosis not present

## 2022-11-24 DIAGNOSIS — M19011 Primary osteoarthritis, right shoulder: Secondary | ICD-10-CM | POA: Diagnosis not present

## 2022-11-24 NOTE — Telephone Encounter (Signed)
Okay for 1/2 day/week intermittently leave due to right shoulder arthritis and pain

## 2022-11-24 NOTE — Progress Notes (Signed)
Office Visit Note   Patient: Hannah [name]           Date of Birth: [birth year]           MRN: [id number] Visit Date: 11/24/2022 Requested by: Gaspar Garbe, MD [address] [place],  [place] [address] PCP: Wylene Simmer, Adelfa Koh, MD  Subjective: Chief Complaint  Patient presents with   Right Shoulder - Follow-up    HPI: Hannah [name] is a 62 y.o. female who presents to the office reporting right shoulder pain.  Reports constant pain weakness difficulty with sleep and decreased strength.  Localizes the pain to the shoulder.  Sometimes she has a sharp pain with ambulation.  Denies any neck pain.  She is right-hand dominant.  She cannot really pull or lift.  She feels like the shoulder will pop at times.  She did recently had a tooth extracted without difficulty.  She is on oral chemo for multiple myeloma on a 21-day cycle.  She does typing for living.  She has an appointment with her oncologist next week..                ROS: All systems reviewed are negative as they relate to the chief complaint within the history of present illness.  Patient denies fevers or chills.  Assessment & Plan: Visit Diagnoses:  1. Primary osteoarthritis of right shoulder     Plan: Impression is severe end-stage right shoulder arthritis with limitation of motion.  She be a good candidate for shoulder replacement.  We would need to discuss that with her oncologist in terms of perioperative chemotherapy management.  Did review her labs in terms of white count and albumin both of which look good.  Risk and benefits of shoulder replacement are discussed including not limited to infection or vessel damage incomplete pain relief as well as incomplete restoration of motion.  Patient understands risk and benefits and wishes to proceed.  All questions answered  Follow-Up Instructions: No follow-ups on file.   Orders:  Orders Placed This Encounter  Procedures   CT SHOULDER RIGHT WO CONTRAST   No orders of the  defined types were placed in this encounter.     Procedures: No procedures performed   Clinical Data: No additional findings.  Objective: Vital Signs: LMP 10/22/2016 (Exact Date)   Physical Exam:  Constitutional: Patient appears well-developed HEENT:  Head: Normocephalic Eyes:EOM are normal Neck: Normal range of motion Cardiovascular: Normal rate Pulmonary/chest: Effort normal Neurologic: Patient is alert Skin: Skin is warm Psychiatric: Patient has normal mood and affect  Ortho Exam: Ortho exam demonstrates range of motion on the right to 15/70/80.  Does have a little weakness to external rotation at 4+ out of 5 compared to 5+ out of 5 on the left.  Subscap strength 5+ out of 5 bilaterally.  Does have painful range of motion and crepitus with functional deltoid.  No masses lymphadenopathy or skin changes noted in that shoulder girdle region.  I think based on her current shoulder symptoms of pain that she would be out of work potentially about 1/2-day a week pending recovery from shoulder surgery.  Specialty Comments:  No specialty comments available.  Imaging: No results found.   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   Major depression, recurrent (HCC) 01/12/2018   Chronic right shoulder pain 12/20/2017   MDD (major depressive disorder), recurrent episode, moderate (HCC) 11/04/2017   Primary osteoarthritis of both hands 09/26/2017   Primary osteoarthritis of right shoulder 09/26/2017   Primary osteoarthritis of both hips 09/26/2017   Status post total knee replacement, right 09/26/2017   Primary osteoarthritis of both feet 09/26/2017   History of asthma 09/26/2017   Primary osteoarthritis of left knee 04/18/2017   Menorrhagia  11/04/2016   Fibroid tumor 11/04/2016   Adenomyosis 11/04/2016   Hypertension, essential 11/04/2016   Total knee replacement status 01/28/2016   Past Medical History:  Diagnosis Date   Allergy    Anemia    Anxiety    Arthritis    Asthma    Depression    Hypertension     Family History  Problem Relation Age of Onset   Cancer Mother        uterine    Asthma Mother    Arthritis Daughter    Depression Maternal Uncle    Colon cancer Neg Hx     Past Surgical History:  Procedure Laterality Date   CHOLECYSTECTOMY  1993   COLONOSCOPY     TOTAL KNEE ARTHROPLASTY Right 01/28/2016   TOTAL KNEE ARTHROPLASTY Right 01/28/2016   Procedure: RIGHT TOTAL KNEE ARTHROPLASTY;  Surgeon: Tarry Kos, MD;  Location: [facility];  Service: Orthopedics;  Laterality: Right;   TRIGGER FINGER RELEASE Right 06/18/2014   Procedure: RIGHT LONG FINGER TRIGGER RELEASE;  Surgeon: Cheral Almas, MD;  Location: [facility];  Service: Orthopedics;  Laterality: Right;   TUBAL LIGATION     VAGINAL HYSTERECTOMY Bilateral 11/04/2016   Procedure: HYSTERECTOMY VAGINAL with Bilateral Salpingectomy;  Surgeon: Hal Morales, MD;  Location: [facility];  Service: Gynecology;  Laterality: Bilateral;   WEIL OSTEOTOMY Right 07/07/2020   Procedure: RIGHT FOOT WEIL OSTEOTOMY 2, 3, AND 4 METATARSALS AND PROXIMAL INTERPHALANGEAL JOINT FUSION 2 & 4 TOES;  Surgeon: Nadara Mustard, MD;  Location: [facility];  Service: Orthopedics;  Laterality: Right;   Social History   Occupational History   Not on file  Tobacco Use   Smoking status: Never   Smokeless tobacco: Never  Vaping Use   Vaping Use: Never used  Substance and Sexual Activity   Alcohol use: No    Alcohol/week: 0.0 standard drinks of alcohol   Drug use: No   Sexual activity: Yes    Partners: Male    Birth control/protection: Surgical

## 2022-11-24 NOTE — Telephone Encounter (Signed)
Letter in chart. Did she already drop paperwork off?

## 2022-11-24 NOTE — Telephone Encounter (Signed)
Yes,forms,auth,$25 received. [employer] has to complete. Thanks!

## 2022-11-24 NOTE — Telephone Encounter (Signed)
Patient asking for forms to be completed for intermittent leave. Please advise, as I do not believe this was discussed at todays OV.

## 2022-11-25 ENCOUNTER — Encounter: Payer: Self-pay | Admitting: Hematology

## 2022-11-30 ENCOUNTER — Other Ambulatory Visit: Payer: Self-pay

## 2022-11-30 ENCOUNTER — Other Ambulatory Visit: Payer: Self-pay | Admitting: Hematology

## 2022-12-02 ENCOUNTER — Other Ambulatory Visit: Payer: Self-pay

## 2022-12-02 MED ORDER — LENALIDOMIDE 10 MG PO CAPS: ORAL_CAPSULE | ORAL | 0 refills | Status: DC

## 2022-12-06 ENCOUNTER — Other Ambulatory Visit: Payer: Self-pay

## 2022-12-06 DIAGNOSIS — C9000 Multiple myeloma not having achieved remission: Secondary | ICD-10-CM

## 2022-12-06 DIAGNOSIS — C9001 Multiple myeloma in remission: Secondary | ICD-10-CM

## 2022-12-06 NOTE — Assessment & Plan Note (Signed)
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  She is aware her children are at risk of hemoglobin C trait  

## 2022-12-06 NOTE — Assessment & Plan Note (Signed)
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   -bone marrow biopsy on 07/30/20 showed slightly hypercellular marrow for age and 31% plasma cells. Cytogenetics and FISH were unremarkable -she received VRD (weekly Velcade and dexamethasone, Revlimid 2 weeks on 1 week off) 08/17/20 - June 2022. She responded well. -She underwent high-dose chemo with melphalan and autologous stem cell transplant at [facility] 12/23/20 by Dr. Rosaria Ferries.  White cells engrafted 01/03/21 and platelets 01/13/21, last platelet transfusion 01/02/21 -posttreatment PET 04/06/21 showed NED  -posttreatment bone marrow biopsy 04/29/21 showed 10-20% hypocellular marrow with myeloid hypoplasia and 3% plasma cells -s/p prophylactic valacyclovir for 1 year and dapsone for 6 months posttransplant -she will continue revaccination per [facility] protocol -she began maintenance Revlimid 10 mg daily days 1-21 q. 28 days on 05/03/21. She is tolerating well with no noticeable side effects. will continue indefinitely or until disease progression. -Her last MM panel showed negative M-protein, but slightly increase Kappa light chain level in 08/2022, overall  stable

## 2022-12-06 NOTE — Progress Notes (Unsigned)
[facility]   Telephone:(336) [phone] Fax:(336) [phone]   Clinic Follow up Note   Patient Care Team: Tisovec, Adelfa Koh, MD as PCP - General (Internal Medicine) Pollyann Savoy, MD as Consulting Physician (Rheumatology) Malachy Mood, MD as Consulting Physician (Hematology) Hoover Browns, MD as Consulting Physician (Obstetrics and Gynecology) Bridgett Larsson, LCSW as Social Worker (Licensed Clinical Social Worker)  Date of Service:  12/06/2022  CHIEF COMPLAINT: f/u of IgA kappa multiple myeloma     CURRENT THERAPY:  -Maintenance Revlimid 10 mg, days 1-21 q. 28 days started 05/03/21   ASSESSMENT: *** Hannah [name] is a 62 y.o. female with   No problem-specific Assessment & Plan notes found for this encounter.  ***   PLAN:  SUMMARY OF ONCOLOGIC HISTORY: Oncology History Overview Note  Cancer Staging Multiple myeloma (HCC) Staging form: Plasma Cell Myeloma and Plasma Cell Disorders, AJCC 8th Edition - Clinical stage from 07/20/2020: Beta-2-microglobulin (mg/L): 2.4, Albumin (g/dL): 3.2, ISS: Stage II, High-risk cytogenetics: Unknown, LDH: Normal - Signed by Malachy Mood, MD on 08/02/2020 Beta 2 microglobulin range (mg/L): Less than 3.5 Albumin range (g/dL): Less than 3.5 Cytogenetics: Unknown    Multiple myeloma in remission (HCC)  07/03/2020 Imaging   MRI Lumbar Spine  IMPRESSION: 1. Numerous T1 hypointense and STIR hyperintense lesions throughout the visualized spine and sacrum with multiple areas of extraosseous extension in the sacrum, detailed above and compatible with multiple myeloma given the clinical history. 2. An MRI of the pelvis with contrast could evaluate the full extent of the partially imaged sacral lesions, including left S1-S2 neural foraminal involvement and suspected involvement of the exited left L5 nerve. 3. Multilevel degenerative change without significant canal or foraminal stenosis in the lumbar spine.   07/13/2020 Initial Diagnosis    Multiple myeloma (HCC)   07/20/2020 Cancer Staging   Staging form: Plasma Cell Myeloma and Plasma Cell Disorders, AJCC 8th Edition - Clinical stage from 07/20/2020: Beta-2-microglobulin (mg/L): 2.4, Albumin (g/dL): 3.2, ISS: Stage II, High-risk cytogenetics: Unknown, LDH: Normal - Signed by Malachy Mood, MD on 03/09/2022 Stage prefix: Initial diagnosis Beta 2 microglobulin range (mg/L): Less than 3.5 Albumin range (g/dL): Less than 3.5 Cytogenetics: No abnormalities Bone disease on imaging: Present   07/24/2020 PET scan   IMPRESSION: 1. Moderate to high hypermetabolic activity associated with the expansile soft tissue masses within the LEFT and RIGHT pelvic bones is consistent with active multiple myeloma / plasmacytoma. 2. Lytic lesion in the LEFT scapula with mild metabolic activity is indeterminate. 3. Metabolic activity associated with the RIGHT knee prosthetic and RIGHT distal foot fusion is favored post procedural inflammation.     07/30/2020 - 08/12/2020 Radiation Therapy   Palliative Radiation to Pelvic lesions with Dr Mitzi Hansen    07/30/2020 Pathology Results   DIAGNOSIS:   BONE MARROW, ASPIRATE, CLOT, CORE:  -Normocellular to slightly hypercellular bone marrow for age with  plasmacytosis  -See comment   PERIPHERAL BLOOD:  -Microcytic-normochromic anemia   COMMENT:   The bone marrow shows increased number of atypical plasma cells  representing 31% of all cells in the aspirate associated with prominent  interstitial infiltrates and numerous variably sized clusters in the  clot and biopsy sections.  The findings are most consistent with  persistent/recurrent/previously known plasma cell neoplasm.  For  completeness, immunohistochemical stain for CD138 and in situ  hybridization for kappa and lambda will be performed and the results  reported in an addendum.  Correlation with cytogenetic and FISH studies  is recommended.  08/03/2020 -  Chemotherapy   Zometa q4weeks starting  08/03/20    08/17/2020 - 11/29/2020 Chemotherapy   Velcade weekly, Revlimid, dex 20mg  weekly (VRd)  Starting 08/17/20. Last dose Velcade 11/23/20 and last dose revlimid on 11/29/20       INTERVAL HISTORY: *** Hannah [name] is here for a follow up of IgA kappa multiple myeloma . She was last seen by me on 09/07/2022. She presents to the clinic      All other systems were reviewed with the patient and are negative.  MEDICAL HISTORY:  Past Medical History:  Diagnosis Date   Allergy    Anemia    Anxiety    Arthritis    Asthma    Depression    Hypertension     SURGICAL HISTORY: Past Surgical History:  Procedure Laterality Date   CHOLECYSTECTOMY  1993   COLONOSCOPY     TOTAL KNEE ARTHROPLASTY Right 01/28/2016   TOTAL KNEE ARTHROPLASTY Right 01/28/2016   Procedure: RIGHT TOTAL KNEE ARTHROPLASTY;  Surgeon: Tarry Kos, MD;  Location: [facility];  Service: Orthopedics;  Laterality: Right;   TRIGGER FINGER RELEASE Right 06/18/2014   Procedure: RIGHT LONG FINGER TRIGGER RELEASE;  Surgeon: Cheral Almas, MD;  Location: [facility];  Service: Orthopedics;  Laterality: Right;   TUBAL LIGATION     VAGINAL HYSTERECTOMY Bilateral 11/04/2016   Procedure: HYSTERECTOMY VAGINAL with Bilateral Salpingectomy;  Surgeon: Hal Morales, MD;  Location: [facility];  Service: Gynecology;  Laterality: Bilateral;   WEIL OSTEOTOMY Right 07/07/2020   Procedure: RIGHT FOOT WEIL OSTEOTOMY 2, 3, AND 4 METATARSALS AND PROXIMAL INTERPHALANGEAL JOINT FUSION 2 & 4 TOES;  Surgeon: Nadara Mustard, MD;  Location: [facility];  Service: Orthopedics;  Laterality: Right;    I have reviewed the social history and family history with the patient and they are unchanged from previous note.  ALLERGIES:  is allergic to elemental sulfur and sulfa antibiotics.  MEDICATIONS:  Current Outpatient Medications  Medication Sig Dispense Refill   aspirin EC 81 MG tablet Take 81 mg by mouth daily. Swallow  whole.     calcium carbonate (OSCAL) 1500 (600 Ca) MG TABS tablet Take 600 mg of elemental calcium by mouth daily.     Cholecalciferol (VITAMIN D3) 1.25 MG (50000 UT) CAPS Take 1 capsule by mouth once a week.     ferrous sulfate 325 (65 FE) MG tablet Take 325 mg by mouth 2 (two) times daily with a meal.     folic acid (FOLVITE) 1 MG tablet folic acid 1 mg tablet  TAKE 1 TABLET BY MOUTH EVERY DAY     lenalidomide (REVLIMID) 10 MG capsule TAKE 1 CAPSULE BY MOUTH 1 TIME A DAY FOR 21 DAYS ON THEN 7 DAYS OFF Medication can be substituted with generic or brand.  [employer] Authorization: [id number] Effective: 12/02/2022 21 capsule 0   naproxen sodium (ALEVE) 220 MG tablet as needed.     valACYclovir (VALTREX) 500 MG tablet Take 500 mg by mouth 2 (two) times daily.     vitamin C (ASCORBIC ACID) 500 MG tablet      No current facility-administered medications for this visit.    PHYSICAL EXAMINATION: ECOG PERFORMANCE STATUS: {CHL ONC ECOG PS:[phone]}  There were no vitals filed for this visit. Wt Readings from Last 3 Encounters:  09/07/22 220 lb 1.6 oz (99.8 kg)  09/01/22 215 lb 12.8 oz (97.9 kg)  08/15/22 219 lb (99.3 kg)    {Only keep  what was examined. If exam not performed, can use .CEXAM } GENERAL:alert, no distress and comfortable SKIN: skin color, texture, turgor are normal, no rashes or significant lesions EYES: normal, Conjunctiva are pink and non-injected, sclera clear {OROPHARYNX:no exudate, no erythema and lips, buccal mucosa, and tongue normal}  NECK: supple, thyroid normal size, non-tender, without nodularity LYMPH:  no palpable lymphadenopathy in the cervical, axillary {or inguinal} LUNGS: clear to auscultation and percussion with normal breathing effort HEART: regular rate & rhythm and no murmurs and no lower extremity edema ABDOMEN:abdomen soft, non-tender and normal bowel sounds Musculoskeletal:no cyanosis of digits and no clubbing  NEURO: alert & oriented x 3 with fluent  speech, no focal motor/sensory deficits  LABORATORY DATA:  I have reviewed the data as listed    Latest Ref Rng & Units 09/07/2022    8:22 AM 07/19/2022    9:04 AM 06/10/2022    9:54 AM  CBC  WBC 4.0 - 10.5 K/uL 5.0  5.9  6.5   Hemoglobin 12.0 - 15.0 g/dL 13.0  86.5  78.4   Hematocrit 36.0 - 46.0 % 35.1  34.2  35.2   Platelets 150 - 400 K/uL 188  169  155         Latest Ref Rng & Units 09/07/2022    8:22 AM 07/19/2022    9:04 AM 06/10/2022    9:54 AM  CMP  Glucose 70 - 99 mg/dL 96  696  90   BUN 6 - 20 mg/dL 16  17  11    Creatinine 0.44 - 1.00 mg/dL 2.95  2.84  1.32   Sodium 135 - 145 mmol/L 141  141  141   Potassium 3.5 - 5.1 mmol/L 3.8  4.0  3.9   Chloride 98 - 111 mmol/L 106  107  109   CO2 22 - 32 mmol/L 30  29  28    Calcium 8.9 - 10.3 mg/dL 9.4  8.6  8.8   Total Protein 6.5 - 8.1 g/dL 7.2  6.9  6.8   Total Bilirubin 0.3 - 1.2 mg/dL 0.4  0.3  0.3   Alkaline Phos 38 - 126 U/L 69  79  87   AST 15 - 41 U/L 11  14  11    ALT 0 - 44 U/L 8  9  11        RADIOGRAPHIC STUDIES: I have personally reviewed the radiological images as listed and agreed with the findings in the report. No results found.    No orders of the defined types were placed in this encounter.  All questions were answered. The patient knows to call the clinic with any problems, questions or concerns. No barriers to learning was detected. The total time spent in the appointment was {CHL ONC TIME VISIT - GMWNU:[id number]}.     Salome Holmes, CMA 12/06/2022   I, Monica Martinez, CMA, am acting as scribe for Malachy Mood, MD.   {Add scribe attestation statement}

## 2022-12-07 ENCOUNTER — Inpatient Hospital Stay: Payer: BC Managed Care – PPO | Attending: Hematology

## 2022-12-07 ENCOUNTER — Encounter: Payer: Self-pay | Admitting: Hematology

## 2022-12-07 ENCOUNTER — Inpatient Hospital Stay: Payer: BC Managed Care – PPO | Admitting: Hematology

## 2022-12-07 ENCOUNTER — Other Ambulatory Visit: Payer: Self-pay

## 2022-12-07 VITALS — BP 125/81 | HR 85 | Temp 98.2°F | Resp 16 | Ht 66.0 in | Wt 218.5 lb

## 2022-12-07 DIAGNOSIS — Z7961 Long term (current) use of immunomodulator: Secondary | ICD-10-CM | POA: Diagnosis not present

## 2022-12-07 DIAGNOSIS — M25511 Pain in right shoulder: Secondary | ICD-10-CM | POA: Insufficient documentation

## 2022-12-07 DIAGNOSIS — Z9079 Acquired absence of other genital organ(s): Secondary | ICD-10-CM | POA: Diagnosis not present

## 2022-12-07 DIAGNOSIS — D582 Other hemoglobinopathies: Secondary | ICD-10-CM

## 2022-12-07 DIAGNOSIS — Z9484 Stem cells transplant status: Secondary | ICD-10-CM | POA: Insufficient documentation

## 2022-12-07 DIAGNOSIS — I10 Essential (primary) hypertension: Secondary | ICD-10-CM | POA: Diagnosis not present

## 2022-12-07 DIAGNOSIS — F3341 Major depressive disorder, recurrent, in partial remission: Secondary | ICD-10-CM | POA: Diagnosis not present

## 2022-12-07 DIAGNOSIS — C9000 Multiple myeloma not having achieved remission: Secondary | ICD-10-CM

## 2022-12-07 DIAGNOSIS — Z9049 Acquired absence of other specified parts of digestive tract: Secondary | ICD-10-CM | POA: Insufficient documentation

## 2022-12-07 DIAGNOSIS — Z79899 Other long term (current) drug therapy: Secondary | ICD-10-CM | POA: Diagnosis not present

## 2022-12-07 DIAGNOSIS — Z882 Allergy status to sulfonamides status: Secondary | ICD-10-CM | POA: Diagnosis not present

## 2022-12-07 DIAGNOSIS — C9001 Multiple myeloma in remission: Secondary | ICD-10-CM | POA: Diagnosis not present

## 2022-12-07 DIAGNOSIS — D509 Iron deficiency anemia, unspecified: Secondary | ICD-10-CM

## 2022-12-07 LAB — IRON AND IRON BINDING CAPACITY (CC-WL,HP ONLY)
Iron: 55 ug/dL (ref 28–170)
Saturation Ratios: 21 % (ref 10.4–31.8)
TIBC: 269 ug/dL (ref 250–450)
UIBC: 214 ug/dL (ref 148–442)

## 2022-12-07 LAB — CMP (CANCER CENTER ONLY)
ALT: 10 U/L (ref 0–44)
AST: 14 U/L — ABNORMAL LOW (ref 15–41)
Albumin: 3.7 g/dL (ref 3.5–5.0)
Alkaline Phosphatase: 67 U/L (ref 38–126)
Anion gap: 5 (ref 5–15)
BUN: 18 mg/dL (ref 6–20)
CO2: 30 mmol/L (ref 22–32)
Calcium: 9.3 mg/dL (ref 8.9–10.3)
Chloride: 106 mmol/L (ref 98–111)
Creatinine: 0.84 mg/dL (ref 0.44–1.00)
GFR, Estimated: >60 mL/min (ref >60)
Glucose, Bld: 102 mg/dL — ABNORMAL HIGH (ref 70–99)
Potassium: 4 mmol/L (ref 3.5–5.1)
Sodium: 141 mmol/L (ref 135–145)
Total Bilirubin: 0.5 mg/dL (ref 0.3–1.2)
Total Protein: 7.1 g/dL (ref 6.5–8.1)

## 2022-12-07 LAB — CBC WITH DIFFERENTIAL (CANCER CENTER ONLY)
Abs Immature Granulocytes: 0.02 10*3/uL (ref 0.00–0.07)
Basophils Absolute: 0 10*3/uL (ref 0.0–0.1)
Basophils Relative: 1 %
Eosinophils Absolute: 0.4 10*3/uL (ref 0.0–0.5)
Eosinophils Relative: 7 %
HCT: 35.1 % — ABNORMAL LOW (ref 36.0–46.0)
Hemoglobin: 11.7 g/dL — ABNORMAL LOW (ref 12.0–15.0)
Immature Granulocytes: 0 %
Lymphocytes Relative: 32 %
Lymphs Abs: 1.6 10*3/uL (ref 0.7–4.0)
MCH: 24.9 pg — ABNORMAL LOW (ref 26.0–34.0)
MCHC: 33.3 g/dL (ref 30.0–36.0)
MCV: 74.7 fL — ABNORMAL LOW (ref 80.0–100.0)
Monocytes Absolute: 0.3 10*3/uL (ref 0.1–1.0)
Monocytes Relative: 6 %
Neutro Abs: 2.8 10*3/uL (ref 1.7–7.7)
Neutrophils Relative %: 54 %
Platelet Count: 182 10*3/uL (ref 150–400)
RBC: 4.7 MIL/uL (ref 3.87–5.11)
RDW: 15.1 % (ref 11.5–15.5)
WBC Count: 5.2 10*3/uL (ref 4.0–10.5)
nRBC: 0 % (ref 0.0–0.2)

## 2022-12-12 ENCOUNTER — Ambulatory Visit (INDEPENDENT_AMBULATORY_CARE_PROVIDER_SITE_OTHER): Payer: BC Managed Care – PPO | Admitting: Surgical

## 2022-12-12 ENCOUNTER — Encounter: Payer: Self-pay | Admitting: Surgical

## 2022-12-12 DIAGNOSIS — M19011 Primary osteoarthritis, right shoulder: Secondary | ICD-10-CM | POA: Diagnosis not present

## 2022-12-12 NOTE — Progress Notes (Signed)
Follow-up Office Visit Note   Patient: Hannah [name]           Date of Birth: [birth year]           MRN: [id number] Visit Date: 12/12/2022 Requested by: Gaspar Garbe, MD [address] [place],  [place] [address] PCP: Wylene Simmer, Adelfa Koh, MD  Subjective: Chief Complaint  Patient presents with   Other     Review scan    HPI: Hannah [name] is a 62 y.o. female who returns to the office for follow-up visit.    Plan at last visit was: Impression is severe end-stage right shoulder arthritis with limitation of motion. She be a good candidate for shoulder replacement. We would need to discuss that with her oncologist in terms of perioperative chemotherapy management. Did review her labs in terms of white count and albumin both of which look good. Risk and benefits of shoulder replacement are discussed including not limited to infection or vessel damage incomplete pain relief as well as incomplete restoration of motion. Patient understands risk and benefits and wishes to proceed. All questions answered   Since then, patient notes she had CT scan done of her right shoulder.  Her right shoulder pain is unchanged.  She has recently talked with her oncology physician Dr. Mosetta Putt who recommended holding Revlimid 1 week preoperatively and resuming around 2 weeks after surgery.  Tholl denies taking any blood thinners or smoking.  No history of diabetes, heart disease, lung disease, any other major medical problems aside from her multiple myeloma.              ROS: All systems reviewed are negative as they relate to the chief complaint within the history of present illness.  Patient denies fevers or chills.  Assessment & Plan: Visit Diagnoses:  1. Primary osteoarthritis of right shoulder     Plan: Hannah [name] is a 62 y.o. female who returns to the office for follow-up visit for right shoulder pain.  Plan from last visit was noted above in HPI.  She had right shoulder CT scan done for  preoperative planning purposes.  We reviewed the CT scan in the clinic today.  We also discussed the risk and benefits of the reverse shoulder arthroplasty procedure including but not limited to the risk of nerve/blood vessel damage, shoulder stiffness, need for revision surgery, infection, medical complication from surgery.  Discussed the recovery timeframe and what to expect around the time of surgery.  She would like to proceed with surgery as soon as she can.  We will hold Revlimid 1 week before surgery and resume 2 weeks after her surgical date.  Will post her for surgery and follow-up after procedure.  Follow-Up Instructions: No follow-ups on file.   Orders:  No orders of the defined types were placed in this encounter.  No orders of the defined types were placed in this encounter.     Procedures: No procedures performed   Clinical Data: No additional findings.  Objective: Vital Signs: LMP 10/22/2016 (Exact Date)   Physical Exam:  Constitutional: Patient appears well-developed HEENT:  Head: Normocephalic Eyes:EOM are normal Neck: Normal range of motion Cardiovascular: Normal rate Pulmonary/chest: Effort normal Neurologic: Patient is alert Skin: Skin is warm Psychiatric: Patient has normal mood and affect  Ortho Exam: Ortho exam demonstrates right shoulder with 25 degrees X rotation, 60 degrees abduction, 130 degrees forward elevation passively.  She has axillary nerve intact with deltoid firing.  Subscap strength rated 5/5.  2+ radial pulse of the right upper extremity.  Specialty Comments:  No specialty comments available.  Imaging: No results found.   PMFS History: Patient Active Problem List   Diagnosis Date Noted   Multiple myeloma in remission (HCC) 07/13/2020   Claw toe, right    Trigger finger, left middle finger 04/09/2020   Stenosing tenosynovitis of finger of left hand 02/27/2020   Hemoglobin C trait (HCC) 02/27/2018   Major depression, recurrent  (HCC) 01/12/2018   Chronic right shoulder pain 12/20/2017   MDD (major depressive disorder), recurrent episode, moderate (HCC) 11/04/2017   Primary osteoarthritis of both hands 09/26/2017   Primary osteoarthritis of right shoulder 09/26/2017   Primary osteoarthritis of both hips 09/26/2017   Status post total knee replacement, right 09/26/2017   Primary osteoarthritis of both feet 09/26/2017   History of asthma 09/26/2017   Primary osteoarthritis of left knee 04/18/2017   Menorrhagia 11/04/2016   Fibroid tumor 11/04/2016   Adenomyosis 11/04/2016   Hypertension, essential 11/04/2016   Total knee replacement status 01/28/2016   Past Medical History:  Diagnosis Date   Allergy    Anemia    Anxiety    Arthritis    Asthma    Depression    Hypertension     Family History  Problem Relation Age of Onset   Cancer Mother        uterine    Asthma Mother    Arthritis Daughter    Depression Maternal Uncle    Colon cancer Neg Hx     Past Surgical History:  Procedure Laterality Date   CHOLECYSTECTOMY  1993   COLONOSCOPY     TOTAL KNEE ARTHROPLASTY Right 01/28/2016   TOTAL KNEE ARTHROPLASTY Right 01/28/2016   Procedure: RIGHT TOTAL KNEE ARTHROPLASTY;  Surgeon: Tarry Kos, MD;  Location: [facility];  Service: Orthopedics;  Laterality: Right;   TRIGGER FINGER RELEASE Right 06/18/2014   Procedure: RIGHT LONG FINGER TRIGGER RELEASE;  Surgeon: Cheral Almas, MD;  Location: [facility];  Service: Orthopedics;  Laterality: Right;   TUBAL LIGATION     VAGINAL HYSTERECTOMY Bilateral 11/04/2016   Procedure: HYSTERECTOMY VAGINAL with Bilateral Salpingectomy;  Surgeon: Hal Morales, MD;  Location: [facility];  Service: Gynecology;  Laterality: Bilateral;   WEIL OSTEOTOMY Right 07/07/2020   Procedure: RIGHT FOOT WEIL OSTEOTOMY 2, 3, AND 4 METATARSALS AND PROXIMAL INTERPHALANGEAL JOINT FUSION 2 & 4 TOES;  Surgeon: Nadara Mustard, MD;  Location: [facility];  Service:  Orthopedics;  Laterality: Right;   Social History   Occupational History   Not on file  Tobacco Use   Smoking status: Never   Smokeless tobacco: Never  Vaping Use   Vaping status: Never Used  Substance and Sexual Activity   Alcohol use: No    Alcohol/week: 0.0 standard drinks of alcohol   Drug use: No   Sexual activity: Yes    Partners: Male    Birth control/protection: Surgical

## 2022-12-16 NOTE — Progress Notes (Signed)
Hi Debbie.  Can you get a plan from her oncologist regarding perioperative management of her chemo.  Thanks

## 2022-12-23 ENCOUNTER — Other Ambulatory Visit: Payer: Self-pay | Admitting: Hematology

## 2023-01-11 ENCOUNTER — Encounter: Payer: Self-pay | Admitting: Hematology

## 2023-01-17 NOTE — Pre-Procedure Instructions (Signed)
Surgical Instructions   Your procedure is scheduled on January 31, 2023. Report to [facility] Main Entrance "A" at 9:00 A.M., then check in with the Admitting office. Any questions or running late day of surgery: call [phone]  Questions prior to your surgery date: call [phone], Monday-Friday, 8am-4pm. If you experience any cold or flu symptoms such as cough, fever, chills, shortness of breath, etc. between now and your scheduled surgery, please notify us at the above number.     Remember:  Do not eat after midnight the night before your surgery  You may drink clear liquids until 8:00 AM the morning of your surgery.   Clear liquids allowed are: Water, Non-Citrus Juices (without pulp), Carbonated Beverages, Clear Tea, Black Coffee Only (NO MILK, CREAM OR POWDERED CREAMER of any kind), and Gatorade.  Patient Instructions  The night before surgery:  No food after midnight. ONLY clear liquids after midnight  The day of surgery (if you do NOT have diabetes):  Drink ONE (1) Pre-Surgery Clear Ensure by 8:00 AM the morning of surgery. Drink in one sitting. Do not sip.  This drink was given to you during your hospital  pre-op appointment visit.  Nothing else to drink after completing the  Pre-Surgery Clear Ensure.         If you have questions, please contact your surgeon's office.     Take these medicines the morning of surgery with A SIP OF WATER: sertraline (ZOLOFT)  valACYclovir (VALTREX)  lenalidomide (REVLIMID)   Follow your surgeon's instructions on when to stop Aspirin.  If no instructions were given by your surgeon then you will need to call the office to get those instructions.     One week prior to surgery, STOP taking any Aleve, Naproxen, Ibuprofen, Motrin, Advil, Goody's, BC's, all herbal medications, fish oil, and non-prescription vitamins.                     Do NOT Smoke (Tobacco/Vaping) for 24 hours prior to your procedure.  If you use a CPAP at  night, you may bring your mask/headgear for your overnight stay.   You will be asked to remove any contacts, glasses, piercing's, hearing aid's, dentures/partials prior to surgery. Please bring cases for these items if needed.    Patients discharged the day of surgery will not be allowed to drive home, and someone needs to stay with them for 24 hours.  SURGICAL WAITING ROOM VISITATION Patients may have no more than 2 support people in the waiting area - these visitors may rotate.   Pre-op nurse will coordinate an appropriate time for 1 ADULT support person, who may not rotate, to accompany patient in pre-op.  Children under the age of 36 must have an adult with them who is not the patient and must remain in the main waiting area with an adult.  If the patient needs to stay at the hospital during part of their recovery, the visitor guidelines for inpatient rooms apply.  Please refer to the [facility] website for the visitor guidelines for any additional information.   If you received a COVID test during your pre-op visit  it is requested that you wear a mask when out in public, stay away from anyone that may not be feeling well and notify your surgeon if you develop symptoms. If you have been in contact with anyone that has tested positive in the last 10 days please notify you surgeon.      Pre-operative 5  CHG Bathing Instructions   You can play a key role in reducing the risk of infection after surgery. Your skin needs to be as free of germs as possible. You can reduce the number of germs on your skin by washing with CHG (chlorhexidine gluconate) soap before surgery. CHG is an antiseptic soap that kills germs and continues to kill germs even after washing.   DO NOT use if you have an allergy to chlorhexidine/CHG or antibacterial soaps. If your skin becomes reddened or irritated, stop using the CHG and notify one of our RNs at [phone].   Please shower with the CHG soap starting 4 days  before surgery using the following schedule:     Please keep in mind the following:  DO NOT shave, including legs and underarms, starting the day of your first shower.   You may shave your face at any point before/day of surgery.  Place clean sheets on your bed the day you start using CHG soap. Use a clean washcloth (not used since being washed) for each shower. DO NOT sleep with pets once you start using the CHG.   CHG Shower Instructions:  If you choose to wash your hair and private area, wash first with your normal shampoo/soap.  After you use shampoo/soap, rinse your hair and body thoroughly to remove shampoo/soap residue.  Turn the water OFF and apply about 3 tablespoons (45 ml) of CHG soap to a CLEAN washcloth.  Apply CHG soap ONLY FROM YOUR NECK DOWN TO YOUR TOES (washing for 3-5 minutes)  DO NOT use CHG soap on face, private areas, open wounds, or sores.  Pay special attention to the area where your surgery is being performed.  If you are having back surgery, having someone wash your back for you may be helpful. Wait 2 minutes after CHG soap is applied, then you may rinse off the CHG soap.  Pat dry with a clean towel  Put on clean clothes/pajamas   If you choose to wear lotion, please use ONLY the CHG-compatible lotions on the back of this paper.   Additional instructions for the day of surgery: DO NOT APPLY any lotions, deodorants, cologne, or perfumes.   Do not bring valuables to the hospital. [facility] is not responsible for any belongings/valuables. Do not wear nail polish, gel polish, artificial nails, or any other type of covering on natural nails (fingers and toes) Do not wear jewelry or makeup Put on clean/comfortable clothes.  Please brush your teeth.  Ask your nurse before applying any prescription medications to the skin.     CHG Compatible Lotions   Aveeno Moisturizing lotion  Cetaphil Moisturizing Cream  Cetaphil Moisturizing Lotion  Clairol Herbal  Essence Moisturizing Lotion, Dry Skin  Clairol Herbal Essence Moisturizing Lotion, Extra Dry Skin  Clairol Herbal Essence Moisturizing Lotion, Normal Skin  Curel Age Defying Therapeutic Moisturizing Lotion with Alpha Hydroxy  Curel Extreme Care Body Lotion  Curel Soothing Hands Moisturizing Hand Lotion  Curel Therapeutic Moisturizing Cream, Fragrance-Free  Curel Therapeutic Moisturizing Lotion, Fragrance-Free  Curel Therapeutic Moisturizing Lotion, Original Formula  Eucerin Daily Replenishing Lotion  Eucerin Dry Skin Therapy Plus Alpha Hydroxy Crme  Eucerin Dry Skin Therapy Plus Alpha Hydroxy Lotion  Eucerin Original Crme  Eucerin Original Lotion  Eucerin Plus Crme Eucerin Plus Lotion  Eucerin TriLipid Replenishing Lotion  Keri Anti-Bacterial Hand Lotion  Keri Deep Conditioning Original Lotion Dry Skin Formula Softly Scented  Keri Deep Conditioning Original Lotion, Fragrance Free Sensitive Skin Formula  Keri Lotion  Fast Absorbing Fragrance Free Sensitive Skin Formula  Keri Lotion Fast Absorbing Softly Scented Dry Skin Formula  Keri Original Lotion  Keri Skin Renewal Lotion Keri Silky Smooth Lotion  Keri Silky Smooth Sensitive Skin Lotion  Nivea Body Creamy Conditioning Oil  Nivea Body Extra Enriched Lotion  Nivea Body Original Lotion  Nivea Body Sheer Moisturizing Lotion Nivea Crme  Nivea Skin Firming Lotion  NutraDerm 30 Skin Lotion  NutraDerm Skin Lotion  NutraDerm Therapeutic Skin Cream  NutraDerm Therapeutic Skin Lotion  ProShield Protective Hand Cream  Provon moisturizing lotion  Please read over the following fact sheets that you were given.

## 2023-01-18 ENCOUNTER — Encounter (HOSPITAL_COMMUNITY)
Admission: RE | Admit: 2023-01-18 | Discharge: 2023-01-18 | Disposition: A | Discharge status: Home / Self Care | Payer: BC Managed Care – PPO | Source: Ambulatory Visit | Attending: Orthopedic Surgery | Admitting: Orthopedic Surgery

## 2023-01-18 ENCOUNTER — Other Ambulatory Visit: Payer: Self-pay

## 2023-01-18 ENCOUNTER — Encounter (HOSPITAL_COMMUNITY): Payer: Self-pay

## 2023-01-18 VITALS — BP 137/95 | HR 99 | Temp 98.2°F | Resp 18 | Ht 65.0 in | Wt 217.5 lb

## 2023-01-18 DIAGNOSIS — L749 Eccrine sweat disorder, unspecified: Secondary | ICD-10-CM | POA: Diagnosis not present

## 2023-01-18 DIAGNOSIS — Z01818 Encounter for other preprocedural examination: Secondary | ICD-10-CM | POA: Insufficient documentation

## 2023-01-18 DIAGNOSIS — M25511 Pain in right shoulder: Secondary | ICD-10-CM | POA: Insufficient documentation

## 2023-01-18 DIAGNOSIS — Z9484 Stem cells transplant status: Secondary | ICD-10-CM | POA: Diagnosis not present

## 2023-01-18 DIAGNOSIS — Z79899 Other long term (current) drug therapy: Secondary | ICD-10-CM | POA: Diagnosis not present

## 2023-01-18 DIAGNOSIS — C9001 Multiple myeloma in remission: Secondary | ICD-10-CM | POA: Diagnosis not present

## 2023-01-18 HISTORY — DX: Malignant (primary) neoplasm, unspecified: C80.1

## 2023-01-18 LAB — SURGICAL PCR SCREEN
MRSA, PCR: NEGATIVE
Staphylococcus aureus: NEGATIVE

## 2023-01-18 LAB — BASIC METABOLIC PANEL
Anion gap: 9 (ref 5–15)
BUN: 10 mg/dL (ref 6–20)
CO2: 29 mmol/L (ref 22–32)
Calcium: 8.9 mg/dL (ref 8.9–10.3)
Chloride: 101 mmol/L (ref 98–111)
Creatinine, Ser: 0.95 mg/dL (ref 0.44–1.00)
GFR, Estimated: >60 mL/min (ref >60)
Glucose, Bld: 96 mg/dL (ref 70–99)
Potassium: 3.7 mmol/L (ref 3.5–5.1)
Sodium: 139 mmol/L (ref 135–145)

## 2023-01-18 LAB — URINALYSIS, W/ REFLEX TO CULTURE (INFECTION SUSPECTED)
Bilirubin Urine: NEGATIVE
Glucose, UA: NEGATIVE mg/dL
Ketones, ur: NEGATIVE mg/dL
Leukocytes,Ua: NEGATIVE
Nitrite: NEGATIVE
Protein, ur: NEGATIVE mg/dL
Specific Gravity, Urine: 1.015 (ref 1.005–1.030)
pH: 5 (ref 5.0–8.0)

## 2023-01-18 LAB — CBC
HCT: 37.2 % (ref 36.0–46.0)
Hemoglobin: 11.9 g/dL — ABNORMAL LOW (ref 12.0–15.0)
MCH: 23.7 pg — ABNORMAL LOW (ref 26.0–34.0)
MCHC: 32 g/dL (ref 30.0–36.0)
MCV: 74.1 fL — ABNORMAL LOW (ref 80.0–100.0)
Platelets: 155 10*3/uL (ref 150–400)
RBC: 5.02 MIL/uL (ref 3.87–5.11)
RDW: 16.6 % — ABNORMAL HIGH (ref 11.5–15.5)
WBC: 6.1 10*3/uL (ref 4.0–10.5)
nRBC: 0 % (ref 0.0–0.2)

## 2023-01-18 NOTE — Progress Notes (Addendum)
PCP - Richard Tisovec,MD Cardiologist - denies Oncologist - Orland Penman- pt is being treated for multiple myeloma  PPM/ICD - denies Device Orders -  Rep Notified -   Chest x-ray - na EKG - 01/18/23 Stress Test - denies ECHO - 11/25/20 Cardiac Cath - denies  Sleep Study - Pt reports she had a sleep study in the past but was not diagnosed with sleep apnea and does not use a CPAP. CPAP -   Fasting Blood Sugar - na Checks Blood Sugar _____ times a day  Last dose of GLP1 agonist-  na GLP1 instructions:   Blood Thinner Instructions:na Aspirin Instructions:Pt reports her oncologist instructed her to stop aspirin 1 week prior to surgery. She says her last dose will be 01/21/23.  ERAS Protcol -clears until 0800 PRE-SURGERY Ensure or G2- Ensure.  COVID TEST- na   Anesthesia review: yes -medical clearance requested  Patient denies shortness of breath, fever, cough and chest pain at PAT appointment   All instructions explained to the patient, with a verbal understanding of the material. Patient agrees to go over the instructions while at home for a better understanding. Patient also instructed to wear a mask when out in public prior to surgery. The opportunity to ask questions was provided.

## 2023-01-19 NOTE — Progress Notes (Signed)
Anesthesia Chart Review:  Follows with heme/onc Dr. Malachy Mood for history of multiple myeloma in remission.  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 Currently on maintenance Revlimid. Last seen by Dr. Mosetta Putt 12/07/22 and upcoming surgery discussed. Per note, "Right shoulder pain -She is seeing orthopedics, and had a CT scan of right shoulder done recently, I reviewed the results with patient today -Okay to proceed right shoulder surgery, will likely hold Revlimid 1 week before her surgery, and restart in 1 to 2 weeks after her surgery, to minimize her risk of infection and thrombosis."  Preop labs reviewed, mild anemia with hgb 11.9.  EKG 01/18/23: Normal sinus rhythm. Rate 69. Moderate voltage criteria for LVH, may be normal variant  TTE 11/25/20: SUMMARY  The left ventricular size is normal.  LV ejection fraction = 50-55%.  Left ventricular systolic function is low normal.  The right ventricle is normal in size and function.  The left atrial size is normal.  There is no significant valvular stenosis or regurgitation.  Estimated right ventricular systolic pressure is 26 mmHg.  IVC size was normal.  There is no pericardial effusion.  There is no comparison study available.     Zannie Cove [facility] Short Stay Center/Anesthesiology Phone [phone] 01/19/2023 2:41 PM

## 2023-01-19 NOTE — Anesthesia Preprocedure Evaluation (Addendum)
Anesthesia Evaluation  Patient identified by MRN, date of birth, ID band Patient awake    Reviewed: Allergy & Precautions, H&P , NPO status , Patient's Chart, lab work & pertinent test results  Airway Mallampati: III  TM Distance: >3 FB Neck ROM: Full    Dental no notable dental hx.    Pulmonary asthma    Pulmonary exam normal breath sounds clear to auscultation       Cardiovascular hypertension, Normal cardiovascular exam Rhythm:Regular Rate:Normal     Neuro/Psych   Anxiety Depression    negative neurological ROS     GI/Hepatic negative GI ROS, Neg liver ROS,,,  Endo/Other  negative endocrine ROS    Renal/GU negative Renal ROS  negative genitourinary   Musculoskeletal  (+) Arthritis ,    Abdominal   Peds negative pediatric ROS (+)  Hematology Hx of Multiple myeloma   Anesthesia Other Findings   Reproductive/Obstetrics negative OB ROS                              Anesthesia Physical Anesthesia Plan  ASA: 3  Anesthesia Plan: General and Regional   Post-op Pain Management:    Induction: Intravenous  PONV Risk Score and Plan: Ondansetron and Dexamethasone  Airway Management Planned: Oral ETT  Additional Equipment:   Intra-op Plan:   Post-operative Plan: Extubation in OR  Informed Consent: I have reviewed the patients History and Physical, chart, labs and discussed the procedure including the risks, benefits and alternatives for the proposed anesthesia with the patient or authorized representative who has indicated his/her understanding and acceptance.     Dental advisory given  Plan Discussed with: CRNA  Anesthesia Plan Comments: (PAT note by Antionette Poles, PA-C: Follows with heme/onc Dr. Malachy Mood for history of multiple myeloma in remission.  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 Currently on maintenance Revlimid. Last seen by Dr.  Mosetta Putt 12/07/22 and upcoming surgery discussed. Per note, "Right shoulder pain -She is seeing orthopedics, and had a CT scan of right shoulder done recently, I reviewed the results with patient today -Okay to proceed right shoulder surgery, will likely hold Revlimid 1 week before her surgery, and restart in 1 to 2 weeks after her surgery, to minimize her risk of infection and thrombosis."  Preop labs reviewed, mild anemia with hgb 11.9.  EKG 01/18/23: Normal sinus rhythm. Rate 69. Moderate voltage criteria for LVH, may be normal variant  TTE 11/25/20: SUMMARY  The left ventricular size is normal.  LV ejection fraction = 50-55%.  Left ventricular systolic function is low normal.  The right ventricle is normal in size and function.  The left atrial size is normal.  There is no significant valvular stenosis or regurgitation.  Estimated right ventricular systolic pressure is 26 mmHg.  IVC size was normal.  There is no pericardial effusion.  There is no comparison study available.    )         Anesthesia Quick Evaluation

## 2023-01-24 ENCOUNTER — Encounter (HOSPITAL_BASED_OUTPATIENT_CLINIC_OR_DEPARTMENT_OTHER): Payer: Self-pay

## 2023-01-24 ENCOUNTER — Telehealth: Payer: Self-pay | Admitting: Orthopedic Surgery

## 2023-01-24 NOTE — Telephone Encounter (Signed)
Note placed in mychart. Lvm advising

## 2023-01-24 NOTE — Telephone Encounter (Signed)
Pt is requesting an estimated time of how long she will be out of work so she can relay the message to her employer. Please call patient at [phone]

## 2023-01-24 NOTE — Telephone Encounter (Signed)
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

## 2023-01-27 ENCOUNTER — Other Ambulatory Visit: Payer: Self-pay

## 2023-01-27 ENCOUNTER — Other Ambulatory Visit: Payer: Self-pay | Admitting: Hematology

## 2023-01-27 MED ORDER — LENALIDOMIDE 10 MG PO CAPS: ORAL_CAPSULE | ORAL | 0 refills | Status: DC

## 2023-01-31 ENCOUNTER — Observation Stay (HOSPITAL_COMMUNITY): Payer: BC Managed Care – PPO

## 2023-01-31 ENCOUNTER — Other Ambulatory Visit: Payer: Self-pay

## 2023-01-31 ENCOUNTER — Encounter (HOSPITAL_COMMUNITY)
Admission: RE | Disposition: A | Discharge status: Home / Self Care | Payer: Self-pay | Source: Home / Self Care | Attending: Orthopedic Surgery

## 2023-01-31 ENCOUNTER — Ambulatory Visit (HOSPITAL_COMMUNITY): Payer: BC Managed Care – PPO | Admitting: Vascular Surgery

## 2023-01-31 ENCOUNTER — Encounter (HOSPITAL_COMMUNITY): Payer: Self-pay | Admitting: Orthopedic Surgery

## 2023-01-31 ENCOUNTER — Observation Stay (HOSPITAL_COMMUNITY)
Admission: RE | Admit: 2023-01-31 | Discharge: 2023-02-01 | Disposition: A | Discharge status: Home / Self Care | Payer: BC Managed Care – PPO | Attending: Orthopedic Surgery | Admitting: Orthopedic Surgery

## 2023-01-31 DIAGNOSIS — Z96611 Presence of right artificial shoulder joint: Secondary | ICD-10-CM

## 2023-01-31 DIAGNOSIS — Z471 Aftercare following joint replacement surgery: Secondary | ICD-10-CM | POA: Diagnosis not present

## 2023-01-31 DIAGNOSIS — Z96651 Presence of right artificial knee joint: Secondary | ICD-10-CM | POA: Diagnosis not present

## 2023-01-31 DIAGNOSIS — I10 Essential (primary) hypertension: Secondary | ICD-10-CM | POA: Diagnosis not present

## 2023-01-31 DIAGNOSIS — M19011 Primary osteoarthritis, right shoulder: Secondary | ICD-10-CM | POA: Diagnosis not present

## 2023-01-31 DIAGNOSIS — J45909 Unspecified asthma, uncomplicated: Secondary | ICD-10-CM | POA: Diagnosis not present

## 2023-01-31 DIAGNOSIS — M7521 Bicipital tendinitis, right shoulder: Secondary | ICD-10-CM | POA: Insufficient documentation

## 2023-01-31 DIAGNOSIS — Z79899 Other long term (current) drug therapy: Secondary | ICD-10-CM | POA: Insufficient documentation

## 2023-01-31 DIAGNOSIS — Z01818 Encounter for other preprocedural examination: Principal | ICD-10-CM

## 2023-01-31 DIAGNOSIS — Z7982 Long term (current) use of aspirin: Secondary | ICD-10-CM | POA: Insufficient documentation

## 2023-01-31 DIAGNOSIS — G8918 Other acute postprocedural pain: Secondary | ICD-10-CM | POA: Diagnosis not present

## 2023-01-31 DIAGNOSIS — Z85828 Personal history of other malignant neoplasm of skin: Secondary | ICD-10-CM | POA: Diagnosis not present

## 2023-01-31 HISTORY — PX: REVERSE SHOULDER ARTHROPLASTY: SHX5054

## 2023-01-31 HISTORY — PX: BICEPT TENODESIS: SHX5116

## 2023-01-31 SURGERY — ARTHROPLASTY, SHOULDER, TOTAL, REVERSE
Anesthesia: Regional | Site: Shoulder | Laterality: Right

## 2023-01-31 MED ORDER — MENTHOL 3 MG MT LOZG: 1.0000 | LOZENGE | OROMUCOSAL | Status: DC | PRN

## 2023-01-31 MED ORDER — ROCURONIUM BROMIDE 10 MG/ML (PF) SYRINGE
PREFILLED_SYRINGE | INTRAVENOUS | Status: AC
  Filled 2023-01-31: qty 10

## 2023-01-31 MED ORDER — LIDOCAINE 2% (20 MG/ML) 5 ML SYRINGE
INTRAMUSCULAR | Status: DC | PRN
  Administered 2023-01-31: 100 mg via INTRAVENOUS

## 2023-01-31 MED ORDER — VANCOMYCIN HCL 1000 MG IV SOLR
INTRAVENOUS | Status: DC | PRN
  Administered 2023-01-31: 1000 mg via TOPICAL

## 2023-01-31 MED ORDER — MORPHINE SULFATE (PF) 4 MG/ML IV SOLN
INTRAVENOUS | Status: AC
  Filled 2023-01-31: qty 2

## 2023-01-31 MED ORDER — DROPERIDOL 2.5 MG/ML IJ SOLN: 0.6250 mg | Freq: Once | INTRAMUSCULAR | Status: DC | PRN

## 2023-01-31 MED ORDER — CEFAZOLIN SODIUM-DEXTROSE 2-4 GM/100ML-% IV SOLN
2.0000 g | INTRAVENOUS | Status: AC
  Administered 2023-01-31: 2 g via INTRAVENOUS

## 2023-01-31 MED ORDER — ORAL CARE MOUTH RINSE: 15.0000 mL | Freq: Once | OROMUCOSAL | Status: AC

## 2023-01-31 MED ORDER — OXYCODONE HCL 5 MG PO TABS: 5.0000 mg | ORAL_TABLET | Freq: Once | ORAL | Status: DC | PRN

## 2023-01-31 MED ORDER — BUPIVACAINE LIPOSOME 1.3 % IJ SUSP
INTRAMUSCULAR | Status: AC
  Filled 2023-01-31: qty 20

## 2023-01-31 MED ORDER — VANCOMYCIN HCL 1000 MG IV SOLR
INTRAVENOUS | Status: AC
  Filled 2023-01-31: qty 20

## 2023-01-31 MED ORDER — TRANEXAMIC ACID-NACL 1000-0.7 MG/100ML-% IV SOLN
INTRAVENOUS | Status: AC
  Filled 2023-01-31: qty 100

## 2023-01-31 MED ORDER — OXYCODONE HCL 5 MG/5ML PO SOLN: 5.0000 mg | Freq: Once | ORAL | Status: DC | PRN

## 2023-01-31 MED ORDER — ACETAMINOPHEN 10 MG/ML IV SOLN: 1000.0000 mg | Freq: Once | INTRAVENOUS | Status: DC | PRN

## 2023-01-31 MED ORDER — OXYCODONE HCL 5 MG PO TABS
5.0000 mg | ORAL_TABLET | ORAL | Status: DC | PRN
  Administered 2023-01-31 (×2): 5 mg via ORAL
  Filled 2023-01-31 (×2): qty 1

## 2023-01-31 MED ORDER — 0.9 % SODIUM CHLORIDE (POUR BTL) OPTIME
TOPICAL | Status: DC | PRN
  Administered 2023-01-31 (×4): 1000 mL

## 2023-01-31 MED ORDER — ROCURONIUM BROMIDE 10 MG/ML (PF) SYRINGE
PREFILLED_SYRINGE | INTRAVENOUS | Status: DC | PRN
  Administered 2023-01-31: 70 mg via INTRAVENOUS

## 2023-01-31 MED ORDER — FENTANYL CITRATE (PF) 100 MCG/2ML IJ SOLN
INTRAMUSCULAR | Status: AC
  Administered 2023-01-31: 100 ug via INTRAVENOUS
  Filled 2023-01-31: qty 2

## 2023-01-31 MED ORDER — STERILE WATER FOR IRRIGATION IR SOLN
Status: DC | PRN
  Administered 2023-01-31: 1000 mL

## 2023-01-31 MED ORDER — PHENOL 1.4 % MT LIQD: 1.0000 | OROMUCOSAL | Status: DC | PRN

## 2023-01-31 MED ORDER — DOCUSATE SODIUM 100 MG PO CAPS
100.0000 mg | ORAL_CAPSULE | Freq: Two times a day (BID) | ORAL | Status: DC
  Filled 2023-01-31: qty 1

## 2023-01-31 MED ORDER — PROPOFOL 10 MG/ML IV BOLUS
INTRAVENOUS | Status: DC | PRN
Start: 2023-01-31 — End: 2023-01-31
  Administered 2023-01-31: 150 mg via INTRAVENOUS

## 2023-01-31 MED ORDER — BUPIVACAINE HCL (PF) 0.25 % IJ SOLN
INTRAMUSCULAR | Status: AC
  Filled 2023-01-31: qty 30

## 2023-01-31 MED ORDER — SUGAMMADEX SODIUM 200 MG/2ML IV SOLN
INTRAVENOUS | Status: DC | PRN
  Administered 2023-01-31: 200 mg via INTRAVENOUS

## 2023-01-31 MED ORDER — CHLORHEXIDINE GLUCONATE 0.12 % MT SOLN: 15.0000 mL | Freq: Once | OROMUCOSAL | Status: AC

## 2023-01-31 MED ORDER — FENTANYL CITRATE (PF) 100 MCG/2ML IJ SOLN: 100.0000 ug | Freq: Once | INTRAMUSCULAR | Status: AC

## 2023-01-31 MED ORDER — METHOCARBAMOL 500 MG PO TABS
500.0000 mg | ORAL_TABLET | Freq: Four times a day (QID) | ORAL | Status: DC | PRN
  Administered 2023-01-31 (×2): 500 mg via ORAL
  Filled 2023-01-31 (×2): qty 1

## 2023-01-31 MED ORDER — NAPROXEN 250 MG PO TABS
250.0000 mg | ORAL_TABLET | Freq: Two times a day (BID) | ORAL | Status: DC
  Filled 2023-01-31 (×2): qty 1

## 2023-01-31 MED ORDER — ACETAMINOPHEN 500 MG PO TABS
1000.0000 mg | ORAL_TABLET | Freq: Four times a day (QID) | ORAL | Status: DC
  Administered 2023-01-31 – 2023-02-01 (×3): 1000 mg via ORAL
  Filled 2023-01-31 (×3): qty 2

## 2023-01-31 MED ORDER — PHENYLEPHRINE 80 MCG/ML (10ML) SYRINGE FOR IV PUSH (FOR BLOOD PRESSURE SUPPORT)
PREFILLED_SYRINGE | INTRAVENOUS | Status: DC | PRN
  Administered 2023-01-31: 80 ug via INTRAVENOUS

## 2023-01-31 MED ORDER — ACETAMINOPHEN 325 MG PO TABS: 325.0000 mg | ORAL_TABLET | Freq: Four times a day (QID) | ORAL | Status: DC | PRN

## 2023-01-31 MED ORDER — HYDROMORPHONE HCL 1 MG/ML IJ SOLN: 0.5000 mg | INTRAMUSCULAR | Status: DC | PRN

## 2023-01-31 MED ORDER — ONDANSETRON HCL 4 MG PO TABS: 4.0000 mg | ORAL_TABLET | Freq: Four times a day (QID) | ORAL | Status: DC | PRN

## 2023-01-31 MED ORDER — METOCLOPRAMIDE HCL 5 MG/ML IJ SOLN: 5.0000 mg | Freq: Three times a day (TID) | INTRAMUSCULAR | Status: DC | PRN

## 2023-01-31 MED ORDER — DEXAMETHASONE SODIUM PHOSPHATE 10 MG/ML IJ SOLN
INTRAMUSCULAR | Status: DC | PRN
  Administered 2023-01-31: 10 mg via INTRAVENOUS

## 2023-01-31 MED ORDER — LIDOCAINE 2% (20 MG/ML) 5 ML SYRINGE
INTRAMUSCULAR | Status: AC
  Filled 2023-01-31: qty 5

## 2023-01-31 MED ORDER — POVIDONE-IODINE 7.5 % EX SOLN
Freq: Once | CUTANEOUS | Status: DC
  Filled 2023-01-31: qty 118

## 2023-01-31 MED ORDER — LABETALOL HCL 5 MG/ML IV SOLN
INTRAVENOUS | Status: DC | PRN
Start: 2023-01-31 — End: 2023-01-31
  Administered 2023-01-31 (×4): 5 mg via INTRAVENOUS

## 2023-01-31 MED ORDER — ONDANSETRON HCL 4 MG/2ML IJ SOLN
INTRAMUSCULAR | Status: AC
  Filled 2023-01-31: qty 2

## 2023-01-31 MED ORDER — METOCLOPRAMIDE HCL 5 MG PO TABS: 5.0000 mg | ORAL_TABLET | Freq: Three times a day (TID) | ORAL | Status: DC | PRN

## 2023-01-31 MED ORDER — PHENYLEPHRINE HCL-NACL 20-0.9 MG/250ML-% IV SOLN
INTRAVENOUS | Status: DC | PRN
  Administered 2023-01-31: 20 ug/min via INTRAVENOUS

## 2023-01-31 MED ORDER — IRRISEPT - 450ML BOTTLE WITH 0.05% CHG IN STERILE WATER, USP 99.95% OPTIME
TOPICAL | Status: DC | PRN
  Administered 2023-01-31: 450 mL

## 2023-01-31 MED ORDER — TRANEXAMIC ACID-NACL 1000-0.7 MG/100ML-% IV SOLN
1000.0000 mg | INTRAVENOUS | Status: AC
  Administered 2023-01-31: 1000 mg via INTRAVENOUS

## 2023-01-31 MED ORDER — CEFAZOLIN SODIUM-DEXTROSE 2-4 GM/100ML-% IV SOLN
INTRAVENOUS | Status: AC
  Filled 2023-01-31: qty 100

## 2023-01-31 MED ORDER — FENTANYL CITRATE (PF) 100 MCG/2ML IJ SOLN: 25.0000 ug | INTRAMUSCULAR | Status: DC | PRN

## 2023-01-31 MED ORDER — BUPIVACAINE HCL (PF) 0.5 % IJ SOLN
INTRAMUSCULAR | Status: DC | PRN
Start: 2023-01-31 — End: 2023-01-31
  Administered 2023-01-31: 15 mL

## 2023-01-31 MED ORDER — ONDANSETRON HCL 4 MG/2ML IJ SOLN
INTRAMUSCULAR | Status: DC | PRN
  Administered 2023-01-31: 4 mg via INTRAVENOUS

## 2023-01-31 MED ORDER — SODIUM CHLORIDE 0.9 % IV SOLN: INTRAVENOUS | Status: DC

## 2023-01-31 MED ORDER — DEXAMETHASONE SODIUM PHOSPHATE 10 MG/ML IJ SOLN
INTRAMUSCULAR | Status: AC
  Filled 2023-01-31: qty 1

## 2023-01-31 MED ORDER — SERTRALINE HCL 50 MG PO TABS: 50.0000 mg | ORAL_TABLET | Freq: Every day | ORAL | Status: DC

## 2023-01-31 MED ORDER — PROPOFOL 10 MG/ML IV BOLUS
INTRAVENOUS | Status: AC
  Filled 2023-01-31: qty 20

## 2023-01-31 MED ORDER — MIDAZOLAM HCL 2 MG/2ML IJ SOLN: 2.0000 mg | Freq: Once | INTRAMUSCULAR | Status: AC

## 2023-01-31 MED ORDER — LACTATED RINGERS IV SOLN: INTRAVENOUS | Status: DC

## 2023-01-31 MED ORDER — ASPIRIN 81 MG PO TBEC
81.0000 mg | DELAYED_RELEASE_TABLET | Freq: Every day | ORAL | Status: DC
  Administered 2023-01-31: 81 mg via ORAL
  Filled 2023-01-31: qty 1

## 2023-01-31 MED ORDER — ONDANSETRON HCL 4 MG/2ML IJ SOLN: 4.0000 mg | Freq: Four times a day (QID) | INTRAMUSCULAR | Status: DC | PRN

## 2023-01-31 MED ORDER — CHLORHEXIDINE GLUCONATE 0.12 % MT SOLN
OROMUCOSAL | Status: AC
  Administered 2023-01-31: 15 mL via OROMUCOSAL
  Filled 2023-01-31: qty 15

## 2023-01-31 MED ORDER — METHOCARBAMOL 1000 MG/10ML IJ SOLN: 500.0000 mg | Freq: Four times a day (QID) | INTRAVENOUS | Status: DC | PRN

## 2023-01-31 MED ORDER — POVIDONE-IODINE 10 % EX SWAB
2.0000 | Freq: Once | CUTANEOUS | Status: AC
  Administered 2023-01-31: 2 via TOPICAL

## 2023-01-31 MED ORDER — BUPIVACAINE LIPOSOME 1.3 % IJ SUSP
INTRAMUSCULAR | Status: DC | PRN
Start: 2023-01-31 — End: 2023-01-31
  Administered 2023-01-31: 10 mL

## 2023-01-31 MED ORDER — MIDAZOLAM HCL 2 MG/2ML IJ SOLN
INTRAMUSCULAR | Status: AC
  Administered 2023-01-31: 2 mg via INTRAVENOUS
  Filled 2023-01-31: qty 2

## 2023-01-31 MED ORDER — CEFAZOLIN SODIUM-DEXTROSE 2-4 GM/100ML-% IV SOLN
2.0000 g | Freq: Three times a day (TID) | INTRAVENOUS | Status: DC
  Administered 2023-01-31 – 2023-02-01 (×2): 2 g via INTRAVENOUS
  Filled 2023-01-31: qty 100

## 2023-01-31 MED ORDER — LABETALOL HCL 5 MG/ML IV SOLN
INTRAVENOUS | Status: AC
  Filled 2023-01-31: qty 4

## 2023-01-31 SURGICAL SUPPLY — 78 items
AID PSTN UNV HD RSTRNT DISP (MISCELLANEOUS) ×1
ALCOHOL 70% 16 OZ (MISCELLANEOUS) ×2 IMPLANT
APL PRP STRL LF DISP 70% ISPRP (MISCELLANEOUS) ×1
AUG COMP REV MI TAPER ADAPTER (Joint) ×1 IMPLANT
AUGMENT COMP REV MI TAPR ADPTR (Joint) IMPLANT
BAG COUNTER SPONGE SURGICOUNT (BAG) ×2 IMPLANT
BAG SPNG CNTER NS LX DISP (BAG) ×1
BEARING HUMERAL SHLDER 36M STD (Shoulder) IMPLANT
BIT DRILL 1.5X85 (BIT) IMPLANT
BIT DRILL 2.7 W/STOP DISP (BIT) IMPLANT
BIT DRILL QUICK REL 1/8 2PK SL (BIT) IMPLANT
BIT DRILL TWIST 2.7 (BIT) IMPLANT
BLADE SAW SGTL 13X75X1.27 (BLADE) ×2 IMPLANT
BRNG HUM STD 36 RVRS SHLDR (Shoulder) ×1 IMPLANT
BSPLAT GLND SM AUG TPR ADPR (Joint) ×1 IMPLANT
CHLORAPREP W/TINT 26 (MISCELLANEOUS) ×2 IMPLANT
COOLER ICEMAN CLASSIC (MISCELLANEOUS) ×2 IMPLANT
COVER SURGICAL LIGHT HANDLE (MISCELLANEOUS) ×2 IMPLANT
DRAPE INCISE IOBAN 66X45 STRL (DRAPES) ×2 IMPLANT
DRAPE U-SHAPE 47X51 STRL (DRAPES) ×4 IMPLANT
DRSG AQUACEL AG ADV 3.5X10 (GAUZE/BANDAGES/DRESSINGS) ×2 IMPLANT
ELECT BLADE 4.0 EZ CLEAN MEGAD (MISCELLANEOUS) ×1
ELECT REM PT RETURN 9FT ADLT (ELECTROSURGICAL) ×1
ELECTRODE BLDE 4.0 EZ CLN MEGD (MISCELLANEOUS) ×2 IMPLANT
ELECTRODE REM PT RTRN 9FT ADLT (ELECTROSURGICAL) ×2 IMPLANT
GAUZE SPONGE 4X4 12PLY STRL LF (GAUZE/BANDAGES/DRESSINGS) ×2 IMPLANT
GLENOID SPHERE 36MM CVD +3 (Orthopedic Implant) IMPLANT
GLOVE BIOGEL PI IND STRL 6.5 (GLOVE) ×2 IMPLANT
GLOVE BIOGEL PI IND STRL 8 (GLOVE) ×2 IMPLANT
GLOVE ECLIPSE 6.5 STRL STRAW (GLOVE) ×2 IMPLANT
GLOVE ECLIPSE 8.0 STRL XLNG CF (GLOVE) ×2 IMPLANT
GOWN STRL REUS W/ TWL LRG LVL3 (GOWN DISPOSABLE) ×2 IMPLANT
GOWN STRL REUS W/ TWL XL LVL3 (GOWN DISPOSABLE) ×2 IMPLANT
GOWN STRL REUS W/TWL LRG LVL3 (GOWN DISPOSABLE) ×1
GOWN STRL REUS W/TWL XL LVL3 (GOWN DISPOSABLE) ×1
GUIDE BONE RSA SHLD ROT RT (ORTHOPEDIC DISPOSABLE SUPPLIES) IMPLANT
HYDROGEN PEROXIDE 16OZ (MISCELLANEOUS) ×2 IMPLANT
JET LAVAGE IRRISEPT WOUND (IRRIGATION / IRRIGATOR) ×1
KIT BASIN OR (CUSTOM PROCEDURE TRAY) ×2 IMPLANT
KIT TURNOVER KIT B (KITS) ×2 IMPLANT
LAVAGE JET IRRISEPT WOUND (IRRIGATION / IRRIGATOR) ×2 IMPLANT
MANIFOLD NEPTUNE II (INSTRUMENTS) ×2 IMPLANT
NDL SUT 6 .5 CRC .975X.05 MAYO (NEEDLE) IMPLANT
NEEDLE MAYO TAPER (NEEDLE) ×1
NS IRRIG 1000ML POUR BTL (IV SOLUTION) ×2 IMPLANT
PACK SHOULDER (CUSTOM PROCEDURE TRAY) ×2 IMPLANT
PAD COLD SHLDR WRAP-ON (PAD) ×2 IMPLANT
PIN THREADED REVERSE (PIN) IMPLANT
REAMER GUIDE BUSHING SURG DISP (MISCELLANEOUS) IMPLANT
REAMER GUIDE W/SCREW AUG (MISCELLANEOUS) IMPLANT
RESTRAINT HEAD UNIVERSAL NS (MISCELLANEOUS) ×2 IMPLANT
RETRIEVER SUT HEWSON (MISCELLANEOUS) ×2 IMPLANT
SCREW BONE STRL 6.5MMX30MM (Screw) IMPLANT
SCREW LOCKING 4.75MMX15MM (Screw) IMPLANT
SCREW LOCKING NS 4.75MMX20MM (Screw) IMPLANT
SCREW LOCKING STRL 4.75X25X3.5 (Screw) IMPLANT
SHOULDER HUMERAL BEAR 36M STD (Shoulder) ×1 IMPLANT
SLING ARM IMMOBILIZER LRG (SOFTGOODS) ×2 IMPLANT
SLING ARM IMMOBILIZER XL (CAST SUPPLIES) IMPLANT
SOL PREP POV-IOD 4OZ 10% (MISCELLANEOUS) ×2 IMPLANT
SPONGE T-LAP 18X18 ~~LOC~~+RFID (SPONGE) ×2 IMPLANT
STEM HUMERAL STRL 12MMX83MM (Stem) IMPLANT
STRIP CLOSURE SKIN 1/2X4 (GAUZE/BANDAGES/DRESSINGS) ×2 IMPLANT
SUCTION TUBE FRAZIER 10FR DISP (SUCTIONS) ×2 IMPLANT
SUT BROADBAND TAPE 2PK 1.5 (SUTURE) IMPLANT
SUT MNCRL AB 3-0 PS2 18 (SUTURE) ×2 IMPLANT
SUT SILK 2 0 TIES 10X30 (SUTURE) ×2 IMPLANT
SUT VIC AB 0 CT1 27 (SUTURE) ×4
SUT VIC AB 0 CT1 27XBRD ANBCTR (SUTURE) ×8 IMPLANT
SUT VIC AB 1 CT1 27 (SUTURE) ×3
SUT VIC AB 1 CT1 27XBRD ANBCTR (SUTURE) ×4 IMPLANT
SUT VIC AB 2-0 CT1 27 (SUTURE) ×3
SUT VIC AB 2-0 CT1 TAPERPNT 27 (SUTURE) ×6 IMPLANT
SUT VIC AB 2-0 CT2 27 (SUTURE) IMPLANT
SUT VICRYL 0 UR6 27IN ABS (SUTURE) ×4 IMPLANT
TOWEL GREEN STERILE (TOWEL DISPOSABLE) ×2 IMPLANT
TRAY HUM REV SHOULDER STD +6 (Shoulder) IMPLANT
WATER STERILE IRR 1000ML POUR (IV SOLUTION) ×2 IMPLANT

## 2023-01-31 NOTE — Anesthesia Procedure Notes (Signed)
Anesthesia Regional Block: Interscalene brachial plexus block   Pre-Anesthetic Checklist: , timeout performed,  Correct Patient, Correct Site, Correct Laterality,  Correct Procedure, Correct Position, site marked,  Risks and benefits discussed,  Surgical consent,  Pre-op evaluation,  At surgeon's request and post-op pain management  Laterality: Right  Prep: chloraprep       Needles:  Injection technique: Single-shot  Needle Type: Echogenic Stimulator Needle     Needle Length: 9cm  Needle Gauge: 21     Additional Needles:   Procedures:,,,, ultrasound used (permanent image in chart),,    Narrative:  Start time: 01/31/2023 10:36 AM End time: 01/31/2023 10:36 AM Injection made incrementally with aspirations every 5 mL.  Performed by: Personally  Anesthesiologist: [place]adega Nation, MD  Additional Notes: Discussed risks and benefits of the nerve block in detail, including but not limited vascular injury, permanent nerve damage and infection.   Patient tolerated the procedure well. Local anesthetic introduced in an incremental fashion under minimal resistance after negative aspirations. No paresthesias were elicited. After completion of the procedure, no acute issues were identified and patient continued to be monitored by RN.

## 2023-01-31 NOTE — Anesthesia Postprocedure Evaluation (Signed)
Anesthesia Post Note  Patient: Hannah [name]  Procedure(s) Performed: REVERSE SHOULDER ARTHROPLASTY (Right: Shoulder) BICEPS TENODESIS (Right: Shoulder)     Patient location during evaluation: PACU Anesthesia Type: Regional Level of consciousness: awake and alert Pain management: pain level controlled Vital Signs Assessment: post-procedure vital signs reviewed and stable Respiratory status: spontaneous breathing, nonlabored ventilation, respiratory function stable and patient connected to nasal cannula oxygen Cardiovascular status: blood pressure returned to baseline and stable Postop Assessment: no apparent nausea or vomiting Anesthetic complications: no   No notable events documented.  Last Vitals:  Vitals:   01/31/23 1600 01/31/23 1634  BP: 103/75 126/81  Pulse: 75 76  Resp: 18 20  Temp: (!) 36.4 C   SpO2: 96% 95%    Last Pain:  Vitals:   01/31/23 1511  TempSrc:   PainSc: 0-No pain                 [place] Nation

## 2023-01-31 NOTE — H&P (Signed)
Hannah [name] is an 62 y.o. female.   Chief Complaint: Right shoulder pain HPI: Hannah [name] is a 62 y.o. female who presents  reporting a long history of atraumatic onset right shoulder pain.  Reports constant pain weakness difficulty with sleep and decreased strength.  Localizes the pain to the shoulder.  Sometimes she has a sharp pain with ambulation.  Denies any neck pain.  She is right-hand dominant.  She cannot really pull or lift.  She feels like the shoulder will pop at times.  She did recently had a tooth extracted without difficulty.  She is on oral chemo for multiple myeloma on a 21-day cycle.  She does typing for  a living.  She has seen her oncologist who took her off her chemo for 1 week and plans to resume it 2 weeks after surgery.  Past Medical History:  Diagnosis Date   Allergy    Anemia    Anxiety    Arthritis    Asthma    Cancer (HCC)    multiple myeloma   Depression    Hypertension     Past Surgical History:  Procedure Laterality Date   ABDOMINAL HYSTERECTOMY     CHOLECYSTECTOMY  1993   COLONOSCOPY     HERNIA REPAIR     TOTAL KNEE ARTHROPLASTY Right 01/28/2016   TOTAL KNEE ARTHROPLASTY Right 01/28/2016   Procedure: RIGHT TOTAL KNEE ARTHROPLASTY;  Surgeon: Tarry Kos, MD;  Location: [facility] OR;  Service: Orthopedics;  Laterality: Right;   TRIGGER FINGER RELEASE Right 06/18/2014   Procedure: RIGHT LONG FINGER TRIGGER RELEASE;  Surgeon: Cheral Almas, MD;  Location: [facility];  Service: Orthopedics;  Laterality: Right;   TUBAL LIGATION     VAGINAL HYSTERECTOMY Bilateral 11/04/2016   Procedure: HYSTERECTOMY VAGINAL with Bilateral Salpingectomy;  Surgeon: Hal Morales, MD;  Location: [facility];  Service: Gynecology;  Laterality: Bilateral;   WEIL OSTEOTOMY Right 07/07/2020   Procedure: RIGHT FOOT WEIL OSTEOTOMY 2, 3, AND 4 METATARSALS AND PROXIMAL INTERPHALANGEAL JOINT FUSION 2 & 4 TOES;  Surgeon: Nadara Mustard, MD;  Location: [facility];  Service: Orthopedics;  Laterality: Right;    Family History  Problem Relation Age of Onset   Cancer Mother        uterine    Asthma Mother    Arthritis Daughter    Depression Maternal Uncle    Colon cancer Neg Hx    Social History:  reports that she has never smoked. She has never used smokeless tobacco. She reports that she does not drink alcohol and does not use drugs.  Allergies:  Allergies  Allergen Reactions   Elemental Sulfur Swelling    Facial swelling    Sulfa Antibiotics Swelling    Medications Prior to Admission  Medication Sig Dispense Refill   aspirin EC 81 MG tablet Take 81 mg by mouth daily. Swallow whole.     calcium carbonate (OSCAL) 1500 (600 Ca) MG TABS tablet Take 600 mg of elemental calcium by mouth daily.     Cholecalciferol (VITAMIN D3) 1.25 MG (50000 UT) CAPS Take 1 capsule by mouth every Sunday.     ferrous sulfate 325 (65 FE) MG tablet Take 325 mg by mouth 2 (two) times daily with a meal.     lenalidomide (REVLIMID) 10 MG capsule TAKE 1 CAPSULE BY MOUTH 1 TIME A DAY FOR 21 DAYS ON THEN 7 DAYS OFF [facility]#[id number] Effective: 01/27/2023 21 capsule 0   sertraline (  ZOLOFT) 50 MG tablet Take 50 mg by mouth daily.     valACYclovir (VALTREX) 500 MG tablet Take 500 mg by mouth 2 (two) times daily.      No results found. However, due to the size of the patient record, not all encounters were searched. Please check Results Review for a complete set of results. No results found.  Review of Systems  Musculoskeletal:  Positive for arthralgias.  All other systems reviewed and are negative.   Blood pressure (!) 140/85, pulse 94, temperature 98.7 F (37.1 C), temperature source Oral, resp. rate 20, height 5\' 5"  (1.651 m), weight 97.5 kg, last menstrual period 10/22/2016, SpO2 96%. Physical Exam Vitals reviewed.  HENT:     Head: Normocephalic.     Nose: Nose normal.     Mouth/Throat:     Mouth: Mucous membranes are moist.  Eyes:     Pupils: Pupils  are equal, round, and reactive to light.  Cardiovascular:     Rate and Rhythm: Normal rate.     Pulses: Normal pulses.  Pulmonary:     Effort: Pulmonary effort is normal.  Abdominal:     General: Abdomen is flat.  Musculoskeletal:     Cervical back: Normal range of motion.  Skin:    General: Skin is warm.     Capillary Refill: Capillary refill takes less than 2 seconds.  Neurological:     General: No focal deficit present.     Mental Status: She is alert.  Psychiatric:        Mood and Affect: Mood normal.    Ortho exam demonstrates range of motion on the right to 15/70/80. Does have a little weakness to external rotation at 4+ out of 5 compared to 5+ out of 5 on the left. Subscap strength 5+ out of 5 bilaterally. Does have painful range of motion and crepitus with functional deltoid. No masses lymphadenopathy or skin changes noted in that shoulder girdle region.   Assessment/Plan Impression is severe end-stage right shoulder arthritis with interference with activities of daily living.  She has night pain rest pain as well as pain with all ADLs.  Plan at this time is reverse shoulder replacement.  The risk and benefits are discussed with the patient including not limited to infection nerve or vessel damage incomplete pain relief as well as incomplete restoration of full function.  Slightly high risk of infection based on her chemotherapeutic treatment for multiple myeloma.  That aspect of her care is currently being managed by her oncologist.  Plan to use shoulder brace after surgery.  All questions answered.  Burnard Bunting, MD 01/31/2023, 10:22 AM

## 2023-01-31 NOTE — Op Note (Signed)
Hannah [name], LORMAN MEDICAL RECORD NO: [id number] ACCOUNT NO: [id number] DATE OF BIRTH: [birth year] FACILITY: [facility] LOCATION: [facility] PHYSICIAN: Graylin Shiver. Hannah Saucer, MD  Operative Report   DATE OF PROCEDURE: 01/31/2023  PREOPERATIVE DIAGNOSIS:  Right shoulder arthritis and biceps tendinitis.  POSTOPERATIVE DIAGNOSIS:  Right shoulder arthritis and biceps tendinitis.  PROCEDURE: 1.  Right shoulder reverse shoulder replacement using [employer] components.  Glenosphere 36+3 with small augmented baseplate, 1 central compression screw and four peripheral locking screws along with size 12 mini humeral stem with +6 taper offset standard  thickness tray and 36 standard bearing liner. 2.  Biceps tenodesis.  SURGEON:  Graylin Shiver. Hannah Saucer, MD  ASSISTANT:  [name], PA.  INDICATIONS:  This is a 62 year old patient with severe end-stage shoulder arthritis, who presents for operative management after explanation of risks and benefits.  DESCRIPTION OF PROCEDURE:  The patient was brought to the operating room where general endotracheal anesthesia was induced.  Preoperative antibiotics administered.  Timeout was called.  The patient was placed in the beach chair position with the head in  neutral position.  Right shoulder was examined under anesthesia.  Found to have passive range of motion of about 25/80/95.  The shoulder was prescrubbed with hydrogen peroxide followed by alcohol and Betadine, which was allowed to air dry and then  prepped with ChloraPrep solution and draped in sterile manner.  Ioban used to cover the operative field.  After calling timeout, incision made over the coracoid process extending distally about 2 cm away from the axillary crease.  Skin and subcutaneous  tissue sharply divided.  Bleeding points encountered controlled using electrocautery.  IrriSept solution utilized. Cephalic vein mobilized laterally.  Deltopectoral interval was developed.  Anterior attachment of the deltoid was released  manually.   Subdeltoid adhesions were also released.  The patient did have a small supraspinatus tendon tear with minimal retraction.  The axillary nerve was palpated and protected at all times during the case.  Kolbel retractor placed, superior 1.5 cm of the pec  was released.  Biceps tendon was tenodesed to the pec using 5-0 Vicryl sutures.  The biceps tendon was then cut proximally and then used to open up the rotator interval to the base of the coracoid process.  Circumflex vessels were ligated with silk  ligatures.  Subscap was then detached from the lesser tuberosity using a 15 blade and then further dissection was performed using the cautery down to about 2 cm below the inferior humeral head and neck junction.  This dissection was taken around below  the osteophyte around to the 5 o'clock position.  Osteophyte was then removed off the inferior humeral head.  Shoulder was then delivered into the incision.  Osteophytes were removed.  Then, the shoulder was reamed up to 12 mm using a reamer.  Head was  then cut in 30 degrees of retroversion, which was approximately equal to the patient's native version.  Broaching performed up to a size 12 and we placed a 38 mm cap on the humerus.  At this time, attention was directed towards the glenoid.  Humeral  retractors were removed, and the posterior retractor was placed.  Anterior retractor also placed.  With care being taken to avoid injury or stretch to the axillary nerve the labrum was circumferentially excised.  Bankart lesion created from the 12  o'clock to 6 o'clock position. The patient-specific placed on the glenoid and 2 guide pins were placed.  Bone quality on the glenoid was exceedingly hard.  Reaming was  performed in accordance with preoperative templating and posterior reaming was then  performed.  We did achieve about 4-5 mm of inferior reamed up off the inferior portion of the glenoid.  Next, a guide pin was placed and then, trial was placed, which  achieved very good bony contact.  Next, the baseplate was placed and 1 central  compression screw and four peripheral locking screws were placed, which all obtained very good purchase.  Next, we did trial reduction with +3 and +6 glenosphere 36 mm.  Could not reduce with the +6 but we were able to reduce in a very stable fashion  with a 36 glenosphere +3 with +6 humeral tray offset with standard liner.  This gave very good stability and was difficult to reduce, difficult to relocate.  The shoulder was "two fingers tight."  The shoulder was then dislocated and the stem was  removed, thorough irrigation performed and 6 suture tapes were placed through the lesser tuberosity for subscap repair. Subscap release was performed.  Next, we placed the true glenosphere Morse taper onto the glenoid baseplate with very good fixation  achieved.  Next, we placed the 12 stem into the humerus.  Thorough irrigation was performed with IrriSept solution and vancomycin powder placed into the humerus prior to placing the stem.  We then did a trial reduction again with the +6 tray, standard  bearing liner and the stability remained the same.  The true implant was placed and excellent stability was achieved.  Axillary nerve palpated and found to be intact.  4 liters of irrigating solution then utilized in the shoulder.  We were then able to  repair the subscapularis using the suture tapes with the arm in 30 degrees of external rotation.  IrriSept solution utilized in the joint followed by vancomycin powder on the implants followed by a rotator interval closure using #1 Vicryl suture with the  arm in 30 degrees of external rotation.  The anterior portion of the supraspinatus was torn.  Next, we irrigated with IrriSept solution again and then closed the deltopectoral interval using #1 Vicryl suture followed by interrupted inverted 0 Vicryl  suture, 2-0 Vicryl suture, and 3-0 Monocryl with Steri-Strips and Aquacel dressing applied.   Shoulder immobilizer placed.  The patient tolerated the procedure well without immediate complications.  Luke's assistance was required for the procedure for  opening, closing, mobilization of tissue.  His assistance was a medical necessity.   PUS D: 01/31/2023 3:18:49 pm T: 01/31/2023 4:24:00 pm  JOB: [id number]/ [id number]

## 2023-01-31 NOTE — Brief Op Note (Signed)
   01/31/2023  3:10 PM  PATIENT:  Maricela Curet  62 y.o. female  PRE-OPERATIVE DIAGNOSIS:  right shoulder osteoarthritis  POST-OPERATIVE DIAGNOSIS:  right shoulder osteoarthritis  PROCEDURE:  Procedure(s): REVERSE SHOULDER ARTHROPLASTY BICEPS TENODESIS  SURGEON:  Surgeon(s): [name], Corrie Mckusick, MD  ASSISTANT: magnant pa  ANESTHESIA:   general  EBL: 150 ml    Total I/O In: 200 [IV Piggyback:200] Out: -   BLOOD ADMINISTERED: none  DRAINS: none   LOCAL MEDICATIONS USED:  vanco  SPECIMEN:  No Specimen  COUNTS:  YES  TOURNIQUET:  * No tourniquets in log *  DICTATION: .Other Dictation: Dictation Number [id number]  PLAN OF CARE: Admit for overnight observation  PATIENT DISPOSITION:  PACU - hemodynamically stable

## 2023-01-31 NOTE — Progress Notes (Signed)
Patient stated that she needed to have a bowel movement, patient assisted to restroom by this RN and Donivan Scull, NT. Amy remained with patient while she used restroom for 20 minutes. Patient stated that she was finished. Patient was properly cleaned after bowel movement by NT without assistance from patient. Patient assisted to stand by this RN and NT. Patient would not let go of hand rail and was trying to sit on recliner chair arm, patient would not open eyes and would not answer questions. Patient asked to please let go so she could sit down in chair. There was concern that patient would fall if she did not sit down so we encouraged patient to sit repeatedly. A second nurse came into bathroom after overhearing [place] repeatedly attempting to get patient to sit down. Patient was safely transferred to recliner and brought back to bay. Upon arrival back to bay, patient oriented x4, vital signs stable.

## 2023-01-31 NOTE — Anesthesia Procedure Notes (Signed)
Procedure Name: Intubation Date/Time: 01/31/2023 11:30 AM  Performed by: Orlin Hilding, CRNAPre-anesthesia Checklist: Patient identified, Emergency Drugs available, Suction available, Patient being monitored and Timeout performed Patient Re-evaluated:Patient Re-evaluated prior to induction Oxygen Delivery Method: Circle system utilized Preoxygenation: Pre-oxygenation with 100% oxygen Induction Type: IV induction Ventilation: Mask ventilation without difficulty and Oral airway inserted - appropriate to patient size Laryngoscope Size: Mac and 3 Grade View: Grade I Tube type: Oral Tube size: 7.5 mm Number of attempts: 1 Placement Confirmation: ETT inserted through vocal cords under direct vision, positive ETCO2 and breath sounds checked- equal and bilateral Secured at: 25 cm Tube secured with: Tape Dental Injury: Teeth and Oropharynx as per pre-operative assessment

## 2023-01-31 NOTE — Transfer of Care (Signed)
Immediate Anesthesia Transfer of Care Note  Patient: Hannah [name]  Procedure(s) Performed: REVERSE SHOULDER ARTHROPLASTY (Right: Shoulder) BICEPS TENODESIS (Right: Shoulder)  Patient Location: PACU  Anesthesia Type:GA combined with regional for post-op pain  Level of Consciousness: awake, alert , and oriented  Airway & Oxygen Therapy: Patient Spontanous Breathing and Patient connected to face mask oxygen  Post-op Assessment: Report given to RN, Post -op Vital signs reviewed and stable, Patient moving all extremities X 4, and Patient able to stick tongue midline  Post vital signs: Reviewed and stable  Last Vitals:  Vitals Value Taken Time  BP 109/67 01/31/23 1511  Temp 98.6   Pulse 74 01/31/23 1514  Resp 18 01/31/23 1514  SpO2 100 % 01/31/23 1514  Vitals shown include unfiled device data.  Last Pain:  Vitals:   01/31/23 1045  TempSrc:   PainSc: Asleep         Complications: No notable events documented.

## 2023-02-01 ENCOUNTER — Encounter (HOSPITAL_COMMUNITY): Payer: Self-pay | Admitting: Orthopedic Surgery

## 2023-02-01 DIAGNOSIS — Z96651 Presence of right artificial knee joint: Secondary | ICD-10-CM | POA: Diagnosis not present

## 2023-02-01 DIAGNOSIS — Z79899 Other long term (current) drug therapy: Secondary | ICD-10-CM | POA: Diagnosis not present

## 2023-02-01 DIAGNOSIS — I10 Essential (primary) hypertension: Secondary | ICD-10-CM | POA: Diagnosis not present

## 2023-02-01 DIAGNOSIS — M19011 Primary osteoarthritis, right shoulder: Secondary | ICD-10-CM | POA: Diagnosis not present

## 2023-02-01 DIAGNOSIS — Z7982 Long term (current) use of aspirin: Secondary | ICD-10-CM | POA: Diagnosis not present

## 2023-02-01 DIAGNOSIS — J45909 Unspecified asthma, uncomplicated: Secondary | ICD-10-CM | POA: Diagnosis not present

## 2023-02-01 DIAGNOSIS — M7521 Bicipital tendinitis, right shoulder: Secondary | ICD-10-CM | POA: Diagnosis not present

## 2023-02-01 DIAGNOSIS — Z85828 Personal history of other malignant neoplasm of skin: Secondary | ICD-10-CM | POA: Diagnosis not present

## 2023-02-01 MED ORDER — OXYCODONE HCL 5 MG PO TABS
5.0000 mg | ORAL_TABLET | ORAL | Status: DC | PRN
  Administered 2023-02-01: 10 mg via ORAL
  Filled 2023-02-01: qty 2

## 2023-02-01 MED ORDER — NAPROXEN 250 MG PO TABS: 250.0000 mg | ORAL_TABLET | Freq: Two times a day (BID) | ORAL | 0 refills | Status: AC

## 2023-02-01 MED ORDER — METHOCARBAMOL 500 MG PO TABS: 500.0000 mg | ORAL_TABLET | Freq: Three times a day (TID) | ORAL | 0 refills | Status: DC | PRN

## 2023-02-01 MED ORDER — OXYCODONE HCL 5 MG PO TABS: 5.0000 mg | ORAL_TABLET | ORAL | 0 refills | Status: DC | PRN

## 2023-02-01 MED ORDER — NAPROXEN 250 MG PO TABS
250.0000 mg | ORAL_TABLET | Freq: Two times a day (BID) | ORAL | Status: DC
  Administered 2023-02-01: 250 mg via ORAL
  Filled 2023-02-01: qty 1

## 2023-02-01 MED ORDER — DOCUSATE SODIUM 100 MG PO CAPS: 100.0000 mg | ORAL_CAPSULE | Freq: Two times a day (BID) | ORAL | 0 refills | Status: AC

## 2023-02-01 NOTE — Evaluation (Signed)
Occupational Therapy Evaluation Patient Details Name: Hannah [name] MRN: [id number] DOB: 08-10-1960 Today's Date: 02/01/2023   History of Present Illness 62 yo female s/p 9/3 reverse shoulder arthoplasty biceps tendoesis PMH anxiety CA myeloma depression HTN R TKA   Clinical Impression   Patient evaluated by Occupational Therapy with no further acute OT needs identified. All education has been completed and the patient has no further questions. See below for any follow-up Occupational Therapy or equipment needs. OT to sign off. Thank you for referral.         If plan is discharge home, recommend the following:      Functional Status Assessment  Patient has had a recent decline in their functional status and demonstrates the ability to make significant improvements in function in a reasonable and predictable amount of time.  Equipment Recommendations  None recommended by OT    Recommendations for Other Services       Precautions / Restrictions Precautions Precautions: Shoulder Type of Shoulder Precautions: reverse Shoulder Interventions: Shoulder sling/immobilizer;At all times;Off for dressing/bathing/exercises Precaution Comments: handout provided with post shoulder education, pendulums, medbridge access code [id number] guide to pendulums Required Braces or Orthoses: Sling Restrictions Weight Bearing Restrictions: Yes RUE Weight Bearing: Non weight bearing      Mobility Bed Mobility Overal bed mobility: Modified Independent             General bed mobility comments: pt has recliner that she might sleep in. pt able to exit the bed with long sittingand avoiding the R UE    Transfers Overall transfer level: Modified independent                        Balance Overall balance assessment: Mild deficits observed, not formally tested                                         ADL either performed or assessed with clinical judgement   ADL  Overall ADL's : Needs assistance/impaired Eating/Feeding: Modified independent   Grooming: Wash/dry hands;Modified independent           Upper Body Dressing : Minimal assistance   Lower Body Dressing: Minimal assistance   Toilet Transfer: Supervision/safety   Toileting- Clothing Manipulation and Hygiene: Supervision/safety       Functional mobility during ADLs: Supervision/safety General ADL Comments: pt educated on don doff sling at this time. pt educated on positioning in the sling to ensure proper use and prevent use of dominant R hand.   Shoulder precautions ( handout provided): Educated patient on don doff brace with return demonstration,washing under arms with cloth, never to wash directly on incision site, avoid shoulder movement. positioning with pillows in chair for , sleeping positioning (recommend recliner if possible), pt educated on dressing care during bathing/ dressing.  Home exercise program as stated below (indicated by MD).   Vision Baseline Vision/History: 1 Wears glasses Ability to See in Adequate Light: 0 Adequate Patient Visual Report: No change from baseline Vision Assessment?: No apparent visual deficits     Perception Perception: Not tested       Praxis Praxis: Not tested       Pertinent Vitals/Pain Pain Assessment Pain Assessment: No/denies pain     Extremity/Trunk Assessment Upper Extremity Assessment Upper Extremity Assessment: Right hand dominant;RUE deficits/detail RUE Deficits / Details: nerve block still in place at htis time. pt able  to move digits only   Lower Extremity Assessment Lower Extremity Assessment: Overall WFL for tasks assessed   Cervical / Trunk Assessment Cervical / Trunk Assessment: Normal   Communication Communication Communication: No apparent difficulties   Cognition Arousal: Lethargic Behavior During Therapy: WFL for tasks assessed/performed Overall Cognitive Status: Within Functional Limits for tasks  assessed                                 General Comments: pt very sleepy s/p medications     General Comments  incision is dry and intact. educated to visual look at bandage daily to prevent any water from under the bandage.    Exercises Exercises: Shoulder Shoulder Exercises Pendulum Exercise: PROM, 15 reps, Standing, Right Elbow Flexion: 15 reps, PROM, Seated Wrist Flexion: PROM, 5 reps, Seated Wrist Extension: AROM, 15 reps, Seated Neck Lateral Flexion - Right: AROM, 5 reps, Seated Neck Lateral Flexion - Left: AROM, 5 reps, Seated   Shoulder Instructions Shoulder Instructions Donning/doffing shirt without moving shoulder: Minimal assistance Method for sponge bathing under operated UE: Minimal assistance Donning/doffing sling/immobilizer: Minimal assistance Correct positioning of sling/immobilizer: Modified independent Pendulum exercises (written home exercise program): Minimal assistance ROM for elbow, wrist and digits of operated UE: Minimal assistance Sling wearing schedule (on at all times/off for ADL's): Modified independent Proper positioning of operated UE when showering: Minimal assistance Positioning of UE while sleeping: Minimal assistance    Home Living Family/patient expects to be discharged to:: Private residence Living Arrangements: Spouse/significant other;Children Available Help at Discharge: Family Type of Home: House Home Access: Stairs to enter Secretary/administrator of Steps: 1 Entrance Stairs-Rails: None Home Layout: Two level;1/2 bath on main level Alternate Level Stairs-Number of Steps: flight Alternate Level Stairs-Rails: Right Bathroom Shower/Tub: Chief Strategy Officer: Handicapped height     Home Equipment: Cane - single point   Additional Comments: pt reports having a recliner on main level so could sleep on main floor if needed. pt has spouse with visual deficits. son 22 lives in the home and has taken off 4 days  to (A) with d/c. Daughter is transportation home.      Prior Functioning/Environment Prior Level of Function : Working/employed;Driving;Independent/Modified Independent               ADLs Comments: works from home        OT Problem List:        OT Treatment/Interventions:      OT Goals(Current goals can be found in the care plan section) Acute Rehab OT Goals Potential to Achieve Goals: Good  OT Frequency:      Co-evaluation              AM-PAC OT "6 Clicks" Daily Activity     Outcome Measure Help from another person eating meals?: A Little Help from another person taking care of personal grooming?: A Little Help from another person toileting, which includes using toliet, bedpan, or urinal?: A Little Help from another person bathing (including washing, rinsing, drying)?: A Little Help from another person to put on and taking off regular upper body clothing?: A Little Help from another person to put on and taking off regular lower body clothing?: A Little 6 Click Score: 18   End of Session Nurse Communication: Mobility status;Precautions;Weight bearing status  Activity Tolerance: Patient tolerated treatment well Patient left: in bed;with call bell/phone within reach;with family/visitor present;with nursing/sitter in room (dc  being provided to pt and daughter by RN)  OT Visit Diagnosis: Unsteadiness on feet (R26.81)                Time: 0981-1914 OT Time Calculation (min): 35 min Charges:  OT General Charges $OT Visit: 1 Visit OT Evaluation $OT Eval Moderate Complexity: 1 Mod OT Treatments $Self Care/Home Management : 8-22 mins   Brynn, OTR/L  Acute Rehabilitation Services Office: [phone] .   Mateo Flow 02/01/2023, 10:14 AM

## 2023-02-01 NOTE — Plan of Care (Signed)
Pt doing well. Pt given D/C instructions with verbal understanding. Rx's were sent to the pharmacy by MD. Pt's incision is clean and dry with no sign of infection. Pt's IV was removed prior to D/C. Pt D/C'd home via wheelchair per MD order. Pt is stable @ D/C and has no other needs at this time. Ashley Allred, RN  

## 2023-02-01 NOTE — Progress Notes (Signed)
  Subjective: Hannah [name] is a 62 y.o. female s/p right RSA.  They are POD 1.  Pt's pain is controlled.  Patient denies any complaints of chest pain, shortness of breath, abdominal pain.  Block still in effect.  Objective: Vital signs in last 24 hours: Temp:  [97 F (36.1 C)-98.7 F (37.1 C)] 98 F (36.7 C) (09/04 0731) Pulse Rate:  [71-102] 88 (09/04 0731) Resp:  [9-20] 16 (09/04 0731) BP: (103-157)/(67-101) 119/71 (09/04 0731) SpO2:  [95 %-100 %] 97 % (09/04 0731) Weight:  [97.5 kg] 97.5 kg (09/03 0859)  Intake/Output from previous day: 09/03 0701 - 09/04 0700 In: 2400 [P.O.:1200; I.V.:1000; IV Piggyback:200] Out: 100 [Blood:100] Intake/Output this shift: No intake/output data recorded.  Exam:  No gross blood or drainage overlying the dressing 2+ radial pulse of the operative extremity Postoperative physical exam somewhat limited by interscalene block but intact EPL, FPL, finger abduction, deltoid   Labs: No results for input(s): "HGB" in the last 72 hours. No results for input(s): "WBC", "RBC", "HCT", "PLT" in the last 72 hours. No results for input(s): "NA", "K", "CL", "CO2", "BUN", "CREATININE", "GLUCOSE", "CALCIUM" in the last 72 hours. No results for input(s): "LABPT", "INR" in the last 72 hours.  Assessment/Plan: Pt is POD 1 s/p right RSA    -Plan to discharge to home today   -No lifting with the operative arm  -Stay in sling except for showering/sleeping and using CPM machine at home.  No lifting with the operative arm more than 1 to 2 pounds  -Follow-up with Dr. [name] in clinic 2 weeks postoperatively     [facility] 02/01/2023, 8:20 AM

## 2023-02-01 NOTE — Progress Notes (Signed)
PT Cancellation Note and Discharge  Patient Details Name: Hannah [name] MRN: [id number] DOB: Dec 02, 1960   Cancelled Treatment:    Reason Eval/Treat Not Completed: PT screened, no needs identified, will sign off. Discussed pt case with OT who reports pt is currently mobilizing without assistance and does not require a formal PT evaluation at this time. PT signing off. If needs change, please reconsult.     [name] 02/01/2023, 10:18 AM  [name], PT, DPT Acute Rehabilitation Services Secure Chat Preferred Office: [phone]

## 2023-02-02 LAB — SURGICAL PATHOLOGY

## 2023-02-11 DIAGNOSIS — M7521 Bicipital tendinitis, right shoulder: Secondary | ICD-10-CM

## 2023-02-15 ENCOUNTER — Other Ambulatory Visit (INDEPENDENT_AMBULATORY_CARE_PROVIDER_SITE_OTHER): Payer: BC Managed Care – PPO

## 2023-02-15 ENCOUNTER — Ambulatory Visit (INDEPENDENT_AMBULATORY_CARE_PROVIDER_SITE_OTHER): Payer: BC Managed Care – PPO | Admitting: Surgical

## 2023-02-15 ENCOUNTER — Encounter: Payer: Self-pay | Admitting: Surgical

## 2023-02-15 DIAGNOSIS — Z96611 Presence of right artificial shoulder joint: Secondary | ICD-10-CM

## 2023-02-15 NOTE — Discharge Summary (Signed)
Physician Discharge Summary      Patient ID: Hannah [name] MRN: [id number] DOB/AGE: 1960-10-16 62 y.o.  Admit date: 01/31/2023 Discharge date: 9//24  Admission Diagnoses:  Principal Problem:   S/P reverse total shoulder arthroplasty, right Active Problems:   Biceps tendonitis on right   Discharge Diagnoses:  Same  Surgeries: Procedure(s): REVERSE SHOULDER ARTHROPLASTY BICEPS TENODESIS on 01/31/2023   Consultants:   Discharged Condition: Stable  Hospital Course: Hannah [name] is an 62 y.o. female who was admitted 01/31/2023 with a chief complaint of right shoulder pain, and found to have a diagnosis of right shoulder osteoarthritis.  They were brought to the operating room on 01/31/2023 and underwent the above named procedures.  Pt awoke from anesthesia without complication and was transferred to the floor. On POD1, patient's pain controlled.  No red flag signs or symptoms.  Discharged home on POD 1..  Pt will f/u with Dr. [name] in clinic in ~2 weeks.   Antibiotics given:  Anti-infectives (From admission, onward)    Start     Dose/Rate Route Frequency Ordered Stop   01/31/23 1900  ceFAZolin (ANCEF) IVPB 2g/100 mL premix  Status:  Discontinued        2 g 200 mL/hr over 30 Minutes Intravenous Every 8 hours 01/31/23 1632 02/01/23 1527   01/31/23 1253  vancomycin (VANCOCIN) powder  Status:  Discontinued          As needed 01/31/23 1254 01/31/23 1508   01/31/23 0915  ceFAZolin (ANCEF) IVPB 2g/100 mL premix        2 g 200 mL/hr over 30 Minutes Intravenous On call to O.R. 01/31/23 0908 01/31/23 1142   01/31/23 0910  ceFAZolin (ANCEF) 2-4 GM/100ML-% IVPB       Note to Pharmacy: Isabel Caprice, Destiny: cabinet override      01/31/23 0910 01/31/23 1144     .  Recent vital signs:  Vitals:   02/01/23 0438 02/01/23 0731  BP: 109/70 119/71  Pulse: 89 88  Resp: 18 16  Temp: 98.1 F (36.7 C) 98 F (36.7 C)  SpO2: 98% 97%    Recent laboratory studies:  Results for orders placed or  performed during the hospital encounter of 01/31/23  Surgical pathology  Result Value Ref Range   SURGICAL PATHOLOGY      SURGICAL PATHOLOGY CASE: [id number] PATIENT: Aurelio Brash Surgical Pathology Report     Clinical History: right shoulder osteoarthritis (cm)     FINAL MICROSCOPIC DIAGNOSIS:  A. RIGHT HUMERAL HEAD, RESECTION: Gross and histologic changes consistent with degenerative joint disease   GROSS DESCRIPTION:  Received fresh is an irregular portion of firm bone clinically identified as "right humeral head", 4.7 x 4.3 x 1.8 cm.  The periphery has a 1.5 cm in diameter uniformly circular through and through defect. One aspect has a pink-white to tan-red smooth to roughened articular surface with polished areas consistent with eburnation.  The opposite surface has an evenly resected margin.  Cut surfaces have yellow-pink to pink-red dense trabecular bone with no discrete mass lesions. Representative sections are submitted 1 block following decalcification.  SW 02/01/2023  Final Diagnosis performed by Jerene Bears, MD.   Electronically signed 02/02/2023 Technic al and / or Professional components performed at [facility]. [facility], [address] [place], [place] [address].  Immunohistochemistry Technical component (if applicable) was performed at [facility]. [address], [place], [place] [address].   IMMUNOHISTOCHEMISTRY DISCLAIMER (if applicable): Some of these immunohistochemical stains may have  been developed and the performance characteristics determine by [facility]. Some may not have been cleared or approved by the U.S. Food and Drug Administration. The FDA has determined that such clearance or approval is not necessary. This test is used for clinical purposes. It should not be regarded as investigational or for research. This laboratory is certified under the Clinical Laboratory Improvement  Amendments of 1988 (CLIA-88) as qualified to perform high complexity clinical laboratory testing.  The controls stained appropriately.   IHC stains are performed on formalin fixed , paraffin embedded tissue using a 3,3"diaminobenzidine (DAB) chromogen and Leica Bond Autostainer System. The staining intensity of the nucleus is score manually and is reported as the percentage of tumor cell nuclei demonstrating specific nuclear staining. The specimens are fixed in 10% Neutral Formalin for at least 6 hours and up to 72hrs. These tests are validated on decalcified tissue. Results should be interpreted with caution given the possibility of false negative results on decalcified specimens. Antibody Clones are as follows ER-clone 69F, PR-clone 16, Ki67- clone MM1. Some of these immunohistochemical stains may have been developed and the performance characteristics determined by [facility].     Discharge Medications:   Allergies as of 02/01/2023       Reactions   Elemental Sulfur Swelling   Facial swelling    Sulfa Antibiotics Swelling        Medication List     STOP taking these medications    lenalidomide 10 MG capsule Commonly known as: REVLIMID       TAKE these medications    aspirin EC 81 MG tablet Take 81 mg by mouth daily. Swallow whole.   calcium carbonate 1500 (600 Ca) MG Tabs tablet Commonly known as: OSCAL Take 600 mg of elemental calcium by mouth daily.   docusate sodium 100 MG capsule Commonly known as: COLACE Take 1 capsule (100 mg total) by mouth 2 (two) times daily.   ferrous sulfate 325 (65 FE) MG tablet Take 325 mg by mouth 2 (two) times daily with a meal.   methocarbamol 500 MG tablet Commonly known as: ROBAXIN Take 1 tablet (500 mg total) by mouth every 8 (eight) hours as needed for muscle spasms.   naproxen 250 MG tablet Commonly known as: NAPROSYN Take 1 tablet (250 mg total) by mouth 2 (two) times daily with a meal.   oxyCODONE 5 MG  immediate release tablet Commonly known as: Oxy IR/ROXICODONE Take 1 tablet (5 mg total) by mouth every 4 (four) hours as needed for moderate pain (pain score 4-6).   sertraline 50 MG tablet Commonly known as: ZOLOFT Take 50 mg by mouth daily.   valACYclovir 500 MG tablet Commonly known as: VALTREX Take 500 mg by mouth 2 (two) times daily.   Vitamin D3 1.25 MG (50000 UT) Caps Take 1 capsule by mouth every Sunday.        Diagnostic Studies: DG Shoulder Right Port  Result Date: 01/31/2023 CLINICAL DATA:  Status post right shoulder arthroplasty. EXAM: RIGHT SHOULDER - 1 VIEW COMPARISON:  Right shoulder radiographs 05/11/2022 FINDINGS: Frontal and lateral views of the right shoulder. Interval reverse total right shoulder arthroplasty. No perihardware lucency is seen to indicate hardware failure or loosening. Moderate joint space narrowing and peripheral osteophytosis of the acromioclavicular joint. No acute fracture or dislocation. IMPRESSION: Interval reverse total right shoulder arthroplasty without evidence of hardware failure or loosening. Electronically Signed   By: Neita Garnet M.D.   On: 01/31/2023 18:03    Disposition:  Discharge disposition: 01-Home or Self Care       Discharge Instructions     Call MD / Call 911   Complete by: As directed    If you experience chest pain or shortness of breath, CALL 911 and be transported to the hospital emergency room.  If you develope a fever above 101 F, pus (white drainage) or increased drainage or redness at the wound, or calf pain, call your surgeon's office.   Constipation Prevention   Complete by: As directed    Drink plenty of fluids.  Prune juice may be helpful.  You may use a stool softener, such as Colace (over the counter) 100 mg twice a day.  Use MiraLax (over the counter) for constipation as needed.   Diet - low sodium heart healthy   Complete by: As directed    Discharge instructions   Complete by: As directed    You may  shower, dressing is waterproof.  Do not bathe or soak the operative shoulder in a tub, pool.  Use the CPM machine 3 times a day for one hour each time, increasing the degrees of range of motion with each use.  No lifting with the operative shoulder. Continue use of the sling; you may come out of the sling 2-3 times per day in order to straighten your elbow to avoid elbow stiffness.  Follow-up with Dr. [name] in ~2 weeks on your given appointment date.  We will remove your adhesive bandage at that time.  Do not take your Revlimid for 2 weeks postoperatively and then you may resume it 2 weeks from the date of surgery.  Dental Antibiotics:  In most cases prophylactic antibiotics for Dental procdeures after total joint surgery are not necessary.  Exceptions are as follows:  1. History of prior total joint infection  2. Severely immunocompromised (Organ Transplant, cancer chemotherapy, Rheumatoid biologic meds such as Humera)  3. Poorly controlled diabetes (A1C &gt; 8.0, blood glucose over 200)  If you have one of these conditions, contact your surgeon for an antibiotic prescription, prior to your dental procedure.   Increase activity slowly as tolerated   Complete by: As directed    Post-operative opioid taper instructions:   Complete by: As directed    POST-OPERATIVE OPIOID TAPER INSTRUCTIONS: It is important to wean off of your opioid medication as soon as possible. If you do not need pain medication after your surgery it is ok to stop day one. Opioids include: Codeine, Hydrocodone(Norco, Vicodin), Oxycodone(Percocet, oxycontin) and hydromorphone amongst others.  Long term and even short term use of opiods can cause: Increased pain response Dependence Constipation Depression Respiratory depression And more.  Withdrawal symptoms can include Flu like symptoms Nausea, vomiting And more Techniques to manage these symptoms Hydrate well Eat regular healthy meals Stay active Use  relaxation techniques(deep breathing, meditating, yoga) Do Not substitute Alcohol to help with tapering If you have been on opioids for less than two weeks and do not have pain than it is ok to stop all together.  Plan to wean off of opioids This plan should start within one week post op of your joint replacement. Maintain the same interval or time between taking each dose and first decrease the dose.  Cut the total daily intake of opioids by one tablet each day Next start to increase the time between doses. The last dose that should be eliminated is the evening dose.             Signed: Franky Macho  Demetrius Mahler 02/15/2023, 11:35 AM

## 2023-02-15 NOTE — Progress Notes (Signed)
Post-Op Visit Note   Patient: Hannah [name]           Date of Birth: [birth year]           MRN: [id number] Visit Date: 02/15/2023 PCP: Gaspar Garbe, MD   Assessment & Plan:  Chief Complaint:  Chief Complaint  Patient presents with   Right Shoulder - Follow-up    Reverse shoulder arthroplasty 01/31/2023   Visit Diagnoses:  1. S/P reverse total shoulder arthroplasty, right     Plan: [name] is a 62 y.o. female who presents s/p right reverse shoulder arthroplasty on 01/31/2023.  Patient is doing well and pain is overall controlled.  Using CPM machine as instructed.  Denies any chest pain, SOB, fevers, chills.  No complaint of any instability symptoms.  Only taking naproxen every 8 hours and muscle relaxer about twice a day.  She is sleeping decently on her back with biotic sling.  On exam, patient has range of motion 15 degrees X rotation, 60 degrees abduction, 95 degrees forward elevation passively.  Intact EPL, FPL, finger abduction, finger adduction, pronation/supination, bicep, tricep, deltoid of operative extremity.  Axillary nerve intact with deltoid firing.  Incision is healing well without evidence of infection or dehiscence.  Incision was made sure to be covered with Steri-Strips from the proximal to distal aspect of the length of the incision.  2+ radial pulse of the operative extremity  Plan is discontinue sling.  Okay to very lightly lift with the operative extremity but no lifting anything heavier than a coffee cup or cell phone.  Start physical therapy to focus on passive range of motion and active range of motion with deltoid isometrics.  Do not want to externally rotate past 30 degrees to protect subscapularis repair.  Follow-up in 4 weeks for clinical recheck.  She will restart her Revlimid now that she is 2 weeks out from procedure.  Follow-Up Instructions: No follow-ups on file.   Orders:  Orders Placed This Encounter  Procedures   XR Shoulder Right    Ambulatory referral to Physical Therapy   No orders of the defined types were placed in this encounter.   Imaging: No results found.  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   Major depression, recurrent (HCC) 01/12/2018   Chronic right shoulder pain 12/20/2017   MDD (major depressive disorder), recurrent episode, moderate (HCC) 11/04/2017   Primary osteoarthritis of both hands 09/26/2017   Primary osteoarthritis of right shoulder 09/26/2017   Primary osteoarthritis of both hips 09/26/2017   Status post total knee replacement, right 09/26/2017   Primary osteoarthritis of both feet 09/26/2017   History of asthma 09/26/2017   Primary osteoarthritis of left knee 04/18/2017   Menorrhagia 11/04/2016   Fibroid tumor 11/04/2016   Adenomyosis 11/04/2016   Hypertension, essential 11/04/2016   Total knee replacement status 01/28/2016   Past Medical History:  Diagnosis Date   Allergy    Anemia    Anxiety    Arthritis    Asthma    Cancer (HCC)    multiple myeloma   Depression    Hypertension     Family History  Problem Relation Age of Onset   Cancer Mother        uterine    Asthma Mother    Arthritis Daughter    Depression Maternal Uncle    Colon cancer Neg Hx     Past Surgical History:  Procedure Laterality Date   ABDOMINAL HYSTERECTOMY     BICEPT TENODESIS Right 01/31/2023   Procedure: BICEPS TENODESIS;  Surgeon: Cammy Copa, MD;  Location: [facility] OR;  Service: Orthopedics;  Laterality: Right;   CHOLECYSTECTOMY  1993   COLONOSCOPY     HERNIA REPAIR     REVERSE SHOULDER ARTHROPLASTY Right 01/31/2023   Procedure: REVERSE SHOULDER  ARTHROPLASTY;  Surgeon: Cammy Copa, MD;  Location: [facility] OR;  Service: Orthopedics;  Laterality: Right;   TOTAL KNEE ARTHROPLASTY Right 01/28/2016   TOTAL KNEE ARTHROPLASTY Right 01/28/2016   Procedure: RIGHT TOTAL KNEE ARTHROPLASTY;  Surgeon: Tarry Kos, MD;  Location: [facility] OR;  Service: Orthopedics;  Laterality: Right;   TRIGGER FINGER RELEASE Right 06/18/2014   Procedure: RIGHT LONG FINGER TRIGGER RELEASE;  Surgeon: Cheral Almas, MD;  Location: [facility];  Service: Orthopedics;  Laterality: Right;   TUBAL LIGATION     VAGINAL HYSTERECTOMY Bilateral 11/04/2016   Procedure: HYSTERECTOMY VAGINAL with Bilateral Salpingectomy;  Surgeon: Hal Morales, MD;  Location: [facility] ORS;  Service: Gynecology;  Laterality: Bilateral;   WEIL OSTEOTOMY Right 07/07/2020   Procedure: RIGHT FOOT WEIL OSTEOTOMY 2, 3, AND 4 METATARSALS AND PROXIMAL INTERPHALANGEAL JOINT FUSION 2 & 4 TOES;  Surgeon: Nadara Mustard, MD;  Location: [facility];  Service: Orthopedics;  Laterality: Right;   Social History   Occupational History   Not on file  Tobacco Use   Smoking status: Never   Smokeless tobacco: Never  Vaping Use   Vaping status: Never Used  Substance and Sexual Activity   Alcohol use: No    Alcohol/week: 0.0 standard drinks of alcohol   Drug use: No   Sexual activity: Yes    Partners: Male    Birth control/protection: Surgical

## 2023-02-16 ENCOUNTER — Other Ambulatory Visit: Payer: Self-pay

## 2023-02-16 ENCOUNTER — Encounter: Payer: Self-pay | Admitting: Rehabilitative and Restorative Service Providers"

## 2023-02-16 ENCOUNTER — Ambulatory Visit: Payer: BC Managed Care – PPO | Admitting: Rehabilitative and Restorative Service Providers"

## 2023-02-16 DIAGNOSIS — M25511 Pain in right shoulder: Secondary | ICD-10-CM | POA: Diagnosis not present

## 2023-02-16 DIAGNOSIS — M6281 Muscle weakness (generalized): Secondary | ICD-10-CM | POA: Diagnosis not present

## 2023-02-16 DIAGNOSIS — G8929 Other chronic pain: Secondary | ICD-10-CM

## 2023-02-16 DIAGNOSIS — R600 Localized edema: Secondary | ICD-10-CM

## 2023-02-16 NOTE — Therapy (Signed)
OUTPATIENT PHYSICAL THERAPY EVALUATION   Patient Name: Hannah [name] MRN: [id number] DOB:Sep 10, 1960, 62 y.o., female Today's Date: 02/16/2023  END OF SESSION:  PT End of Session - 02/16/23 1506     Visit Number 1    Number of Visits 20    Date for PT Re-Evaluation 04/27/23    Authorization Type [employer] 20% coinsurance    Progress Note Due on Visit 10    PT Start Time 1512    PT Stop Time 1540    PT Time Calculation (min) 28 min    Activity Tolerance Patient tolerated treatment well    Behavior During Therapy WFL for tasks assessed/performed             Past Medical History:  Diagnosis Date   Allergy    Anemia    Anxiety    Arthritis    Asthma    Cancer (HCC)    multiple myeloma   Depression    Hypertension    Past Surgical History:  Procedure Laterality Date   ABDOMINAL HYSTERECTOMY     BICEPT TENODESIS Right 01/31/2023   Procedure: BICEPS TENODESIS;  Surgeon: Cammy Copa, MD;  Location: [facility] OR;  Service: Orthopedics;  Laterality: Right;   CHOLECYSTECTOMY  1993   COLONOSCOPY     HERNIA REPAIR     REVERSE SHOULDER ARTHROPLASTY Right 01/31/2023   Procedure: REVERSE SHOULDER ARTHROPLASTY;  Surgeon: Cammy Copa, MD;  Location: [facility];  Service: Orthopedics;  Laterality: Right;   TOTAL KNEE ARTHROPLASTY Right 01/28/2016   TOTAL KNEE ARTHROPLASTY Right 01/28/2016   Procedure: RIGHT TOTAL KNEE ARTHROPLASTY;  Surgeon: Tarry Kos, MD;  Location: [facility] OR;  Service: Orthopedics;  Laterality: Right;   TRIGGER FINGER RELEASE Right 06/18/2014   Procedure: RIGHT LONG FINGER TRIGGER RELEASE;  Surgeon: Cheral Almas, MD;  Location: [facility];  Service: Orthopedics;  Laterality: Right;   TUBAL LIGATION     VAGINAL HYSTERECTOMY Bilateral 11/04/2016   Procedure: HYSTERECTOMY VAGINAL with Bilateral Salpingectomy;  Surgeon: Hal Morales, MD;  Location: [facility];  Service: Gynecology;  Laterality: Bilateral;   WEIL OSTEOTOMY Right 07/07/2020    Procedure: RIGHT FOOT WEIL OSTEOTOMY 2, 3, AND 4 METATARSALS AND PROXIMAL INTERPHALANGEAL JOINT FUSION 2 & 4 TOES;  Surgeon: Nadara Mustard, MD;  Location: [facility];  Service: Orthopedics;  Laterality: Right;   Patient Active Problem List   Diagnosis Date Noted   Biceps tendonitis on right 02/11/2023   S/P reverse total shoulder arthroplasty, right 01/31/2023   Multiple myeloma in remission (HCC) 07/13/2020   Claw toe, right    Trigger finger, left middle finger 04/09/2020   Stenosing tenosynovitis of finger of left hand 02/27/2020   Hemoglobin C trait (HCC) 02/27/2018   Major depression, recurrent (HCC) 01/12/2018   Chronic right shoulder pain 12/20/2017   MDD (major depressive disorder), recurrent episode, moderate (HCC) 11/04/2017   Primary osteoarthritis of both hands 09/26/2017   Primary osteoarthritis of right shoulder 09/26/2017   Primary osteoarthritis of both hips 09/26/2017   Status post total knee replacement, right 09/26/2017   Primary osteoarthritis of both feet 09/26/2017   History of asthma 09/26/2017   Primary osteoarthritis of left knee 04/18/2017   Menorrhagia 11/04/2016   Fibroid tumor 11/04/2016   Adenomyosis 11/04/2016   Hypertension, essential 11/04/2016   Total knee replacement status 01/28/2016    PCP: Gaspar Garbe MD  REFERRING PROVIDER: Julieanne Cotton, PA-C  REFERRING DIAG: [phone] (ICD-10-CM) - S/P  reverse total shoulder arthroplasty, right  THERAPY DIAG:  Chronic right shoulder pain  Muscle weakness (generalized)  Localized edema  Rationale for Evaluation and Treatment: Rehabilitation  ONSET DATE: surgery 01/31/2023  SUBJECTIVE:                                                                                                                                                                                      SUBJECTIVE STATEMENT: S/p reverse shoulder arthroplasty on 01/31/2023.  Pt indicated feeling relatively good  overall.  Still taking pain meds.  Pt indicated out of sling in last day.  Just started driving again.  Pt indicated sleeping trouble "not really".    PERTINENT HISTORY: PMH anxiety CA myeloma depression HTN Rt TKA   PAIN:  NPRS scale: at worst 6-7/10 Pain location: Rt shoulder, general shoulder  Pain description: achy Aggravating factors: arm movements, arm handing out of sling.  Relieving factors: pain med, OTC medicine  PRECAUTIONS: Shoulder - "Do not want to externally rotate past 30 degrees to protect subscapularis repair "  WEIGHT BEARING RESTRICTIONS: Yes Rt shoulder  FALLS:  Has patient fallen in last 6 months? No  LIVING ENVIRONMENT: Lives in: House/apartment   OCCUPATION: Work from home on computer.   PLOF: Independent, light gardening. Walking for exercise.   PATIENT GOALS: Reduce pain, get arm back to ROM/use.   OBJECTIVE:   PATIENT SURVEYS:  02/16/2023 FOTO intake:  29   predicted:  61  COGNITION: 02/16/2023 Overall cognitive status: WFL     SENSATION: 02/16/2023 WFL  POSTURE: 02/16/2023 No specific.   UPPER EXTREMITY ROM:   ROM Right 02/16/2023 AROM (PROM) In supine  Left 02/16/2023  Shoulder flexion 50 (90)   Shoulder extension    Shoulder abduction (62)   Shoulder adduction    Shoulder internal rotation To belly   Shoulder external rotation NT   Elbow flexion    Elbow extension    Wrist flexion    Wrist extension    Wrist ulnar deviation    Wrist radial deviation    Wrist pronation    Wrist supination    (Blank rows = not tested)  UPPER EXTREMITY MMT:  MMT Right 02/16/2023 Left 02/16/2023  Shoulder flexion NT 5/5  Shoulder extension    Shoulder abduction NT 5/5  Shoulder adduction    Shoulder internal rotation NT 5/5  Shoulder external rotation NT 5/5  Middle trapezius    Lower trapezius    Elbow flexion  5/5  Elbow extension  5/5  Wrist flexion    Wrist extension    Wrist ulnar deviation    Wrist radial deviation     Wrist pronation  Wrist supination    Grip strength (lbs)    (Blank rows = not tested)  SHOULDER SPECIAL TESTS: 02/16/2023 No tested today  JOINT MOBILITY TESTING:  02/16/2023 No testing today  PALPATION:  02/16/2023 General tenderness to light touch around shoulder joint.                                                                                                                                                                                                   TODAY'S TREATMENT:                                                                                                       DATE: 02/16/2023 Therex:    HEP instruction/performance c cues for techniques, handout provided.  Trial set performed of each for comprehension and symptom assessment.  See below for exercise list  Manual Rt shoulder passive range in flexion, scaption, abduction to tolerance.    PATIENT EDUCATION: 02/16/2023 Education details: HEP, POC Person educated: Patient Education method: Programmer, multimedia, Demonstration, Verbal cues, and Handouts Education comprehension: verbalized understanding, returned demonstration, and verbal cues required  HOME EXERCISE PROGRAM: Access Code: [email or web address] URL: [email or web address] Date: 02/16/2023 Prepared by: Chyrel Masson  Exercises - Seated Scapular Retraction  - 3-5 x daily - 7 x weekly - 1 sets - 10 reps - 3-5 hold - Standing Horizontal Shoulder Pendulum Supported with Arm Bent  - 2-3 x daily - 7 x weekly - 1-2 sets - 10 reps - Standing Flexion Extension Shoulder Pendulum Supported with Arm Bent  - 2-3 x daily - 7 x weekly - 1-2 sets - 10 reps - Supine Shoulder Flexion Extension AAROM with Dowel  - 2-3 x daily - 7 x weekly - 1-2 sets - 10-15 reps - 3 hold  ASSESSMENT:  CLINICAL IMPRESSION: Patient is a 62 y.o. who comes to clinic with complaints of Rt shoulder pain s/p recent reverse shoulder replacement on 01/31/2023 with mobility, strength and movement  coordination deficits that impair their ability to perform usual daily and recreational functional activities without increase difficulty/symptoms at this time.  Patient to benefit from skilled PT services to address impairments and limitations to improve to  previous level of function without restriction secondary to condition.   OBJECTIVE IMPAIRMENTS: decreased activity tolerance, decreased coordination, decreased endurance, decreased mobility, decreased ROM, decreased strength, hypomobility, increased edema, increased fascial restrictions, impaired perceived functional ability, increased muscle spasms, impaired flexibility, impaired UE functional use, improper body mechanics, and pain.   ACTIVITY LIMITATIONS: carrying, lifting, bending, sleeping, bed mobility, bathing, toileting, dressing, self feeding, reach over head, and hygiene/grooming  PARTICIPATION LIMITATIONS: meal prep, cleaning, laundry, interpersonal relationship, driving, shopping, and community activity  PERSONAL FACTORS:  No specific factors  are also affecting patient's functional outcome.   REHAB POTENTIAL: Good  CLINICAL DECISION MAKING: Stable/uncomplicated  EVALUATION COMPLEXITY: Low   GOALS: Goals reviewed with patient? Yes  SHORT TERM GOALS: (target date for Short term goals are 3 weeks 03/09/2023)  1.Patient will demonstrate independent use of home exercise program to maintain progress from in clinic treatments. Goal status: New  LONG TERM GOALS: (target dates for all long term goals are 10 weeks  04/27/2023 )   1. Patient will demonstrate/report pain at worst less than or equal to 2/10 to facilitate minimal limitation in daily activity secondary to pain symptoms. Goal status: New   2. Patient will demonstrate independent use of home exercise program to facilitate ability to maintain/progress functional gains from skilled physical therapy services. Goal status: New   3. Patient will demonstrate FOTO outcome >  or = 61 % to indicate reduced disability due to condition. Goal status: New   4.  Patient will demonstrate Rt UE MMT 4/5 throughout to facilitate lifting, reaching, carrying at [facility] in daily activity.   Goal status: New   5.  Patient will demonstrate Rt GH joint AROM WFL s symptoms to facilitate usual overhead reaching, self care, dressing at PLOF.    Goal status: New   6.  Patient will demonstrate/report ability to perform work at [employer].  Goal status: New    PLAN:  PT FREQUENCY: 1-2x/week  PT DURATION: 10 weeks  PLANNED INTERVENTIONS: Therapeutic exercises, Therapeutic activity, Neuro Muscular re-education, Balance training, Gait training, Patient/Family education, Joint mobilization, Stair training, DME instructions, Dry Needling, Electrical stimulation, Traction, Cryotherapy, vasopneumatic device Moist heat, Taping, Ultrasound, Ionotophoresis 4mg /ml Dexamethasone, and aquatic therapy ,Manual therapy.  All included unless contraindicated  PLAN FOR NEXT SESSION: Review HEP knowledge/results.    PROM/AROM, deltoid isometrics.  No ER past 30 degrees, No IR activation/strengthening.   Chyrel Masson, PT, DPT, OCS, ATC 02/16/23  3:41 PM

## 2023-02-17 ENCOUNTER — Encounter: Payer: Self-pay | Admitting: Orthopedic Surgery

## 2023-02-20 NOTE — Telephone Encounter (Signed)
Yeah this should be okay as far as the return to work goes.  Not concerning about the tingling in her hand unless this is still going on at her next appointment then we may want to work it up but it is normal to have some tingling after she is in a sling for a while.

## 2023-02-21 ENCOUNTER — Encounter: Payer: Self-pay | Admitting: Physical Therapy

## 2023-02-21 ENCOUNTER — Ambulatory Visit: Payer: BC Managed Care – PPO | Admitting: Physical Therapy

## 2023-02-21 DIAGNOSIS — G8929 Other chronic pain: Secondary | ICD-10-CM

## 2023-02-21 DIAGNOSIS — R600 Localized edema: Secondary | ICD-10-CM | POA: Diagnosis not present

## 2023-02-21 DIAGNOSIS — M6281 Muscle weakness (generalized): Secondary | ICD-10-CM

## 2023-02-21 DIAGNOSIS — M25511 Pain in right shoulder: Secondary | ICD-10-CM

## 2023-02-21 NOTE — Therapy (Signed)
OUTPATIENT PHYSICAL THERAPY TREATMENT   Patient Name: MAESON GRIFFON MRN: [id number] DOB:10/11/1960, 62 y.o., female Today's Date: 02/21/2023  END OF SESSION:  PT End of Session - 02/21/23 1430     Visit Number 2    Number of Visits 20    Date for PT Re-Evaluation 04/27/23    Authorization Type [employer] 20% coinsurance    Progress Note Due on Visit 10    PT Start Time 1430    PT Stop Time 1508    PT Time Calculation (min) 38 min    Activity Tolerance Patient tolerated treatment well    Behavior During Therapy WFL for tasks assessed/performed             Past Medical History:  Diagnosis Date   Allergy    Anemia    Anxiety    Arthritis    Asthma    Cancer (HCC)    multiple myeloma   Depression    Hypertension    Past Surgical History:  Procedure Laterality Date   ABDOMINAL HYSTERECTOMY     BICEPT TENODESIS Right 01/31/2023   Procedure: BICEPS TENODESIS;  Surgeon: Cammy Copa, MD;  Location: [facility];  Service: Orthopedics;  Laterality: Right;   CHOLECYSTECTOMY  1993   COLONOSCOPY     HERNIA REPAIR     REVERSE SHOULDER ARTHROPLASTY Right 01/31/2023   Procedure: REVERSE SHOULDER ARTHROPLASTY;  Surgeon: Cammy Copa, MD;  Location: [facility] OR;  Service: Orthopedics;  Laterality: Right;   TOTAL KNEE ARTHROPLASTY Right 01/28/2016   TOTAL KNEE ARTHROPLASTY Right 01/28/2016   Procedure: RIGHT TOTAL KNEE ARTHROPLASTY;  Surgeon: Tarry Kos, MD;  Location: [facility];  Service: Orthopedics;  Laterality: Right;   TRIGGER FINGER RELEASE Right 06/18/2014   Procedure: RIGHT LONG FINGER TRIGGER RELEASE;  Surgeon: Cheral Almas, MD;  Location: [facility];  Service: Orthopedics;  Laterality: Right;   TUBAL LIGATION     VAGINAL HYSTERECTOMY Bilateral 11/04/2016   Procedure: HYSTERECTOMY VAGINAL with Bilateral Salpingectomy;  Surgeon: Hal Morales, MD;  Location: [facility];  Service: Gynecology;  Laterality: Bilateral;   WEIL OSTEOTOMY Right 07/07/2020    Procedure: RIGHT FOOT WEIL OSTEOTOMY 2, 3, AND 4 METATARSALS AND PROXIMAL INTERPHALANGEAL JOINT FUSION 2 & 4 TOES;  Surgeon: Nadara Mustard, MD;  Location: [facility];  Service: Orthopedics;  Laterality: Right;   Patient Active Problem List   Diagnosis Date Noted   Biceps tendonitis on right 02/11/2023   S/P reverse total shoulder arthroplasty, right 01/31/2023   Multiple myeloma in remission (HCC) 07/13/2020   Claw toe, right    Trigger finger, left middle finger 04/09/2020   Stenosing tenosynovitis of finger of left hand 02/27/2020   Hemoglobin C trait (HCC) 02/27/2018   Major depression, recurrent (HCC) 01/12/2018   Chronic right shoulder pain 12/20/2017   MDD (major depressive disorder), recurrent episode, moderate (HCC) 11/04/2017   Primary osteoarthritis of both hands 09/26/2017   Primary osteoarthritis of right shoulder 09/26/2017   Primary osteoarthritis of both hips 09/26/2017   Status post total knee replacement, right 09/26/2017   Primary osteoarthritis of both feet 09/26/2017   History of asthma 09/26/2017   Primary osteoarthritis of left knee 04/18/2017   Menorrhagia 11/04/2016   Fibroid tumor 11/04/2016   Adenomyosis 11/04/2016   Hypertension, essential 11/04/2016   Total knee replacement status 01/28/2016    PCP: Gaspar Garbe MD  REFERRING PROVIDER: Julieanne Cotton, PA-C  REFERRING DIAG: [phone] (ICD-10-CM) - S/P  reverse total shoulder arthroplasty, right  THERAPY DIAG:  Chronic right shoulder pain  Muscle weakness (generalized)  Localized edema  Rationale for Evaluation and Treatment: Rehabilitation  ONSET DATE: surgery 01/31/2023  SUBJECTIVE:                                                                                                                                                                                      SUBJECTIVE STATEMENT: She is now out of the sling, doing okay overall   PERTINENT HISTORY: PMH anxiety CA  myeloma depression HTN Rt TKA   PAIN:  NPRS scale: at worst 5/10 Pain location: Rt shoulder, general shoulder  Pain description: achy Aggravating factors: arm movements, arm handing out of sling.  Relieving factors: pain med, OTC medicine  PRECAUTIONS: Shoulder - "Do not want to externally rotate past 30 degrees to protect subscapularis repair "  WEIGHT BEARING RESTRICTIONS: Yes Rt shoulder  FALLS:  Has patient fallen in last 6 months? No  LIVING ENVIRONMENT: Lives in: House/apartment   OCCUPATION: Work from home on computer.   PLOF: Independent, light gardening. Walking for exercise.   PATIENT GOALS: Reduce pain, get arm back to ROM/use.   OBJECTIVE:   PATIENT SURVEYS:  02/16/2023 FOTO intake:  29   predicted:  61  COGNITION: 02/16/2023 Overall cognitive status: WFL     SENSATION: 02/16/2023 WFL  POSTURE: 02/16/2023 No specific.   UPPER EXTREMITY ROM:   ROM Right 02/16/2023 AROM (PROM) In supine  Left 02/16/2023  Shoulder flexion 50 (90)   Shoulder extension    Shoulder abduction (62)   Shoulder adduction    Shoulder internal rotation To belly   Shoulder external rotation NT   Elbow flexion    Elbow extension    Wrist flexion    Wrist extension    Wrist ulnar deviation    Wrist radial deviation    Wrist pronation    Wrist supination    (Blank rows = not tested)  UPPER EXTREMITY MMT:  MMT Right 02/16/2023 Left 02/16/2023  Shoulder flexion NT 5/5  Shoulder extension    Shoulder abduction NT 5/5  Shoulder adduction    Shoulder internal rotation NT 5/5  Shoulder external rotation NT 5/5  Middle trapezius    Lower trapezius    Elbow flexion  5/5  Elbow extension  5/5  Wrist flexion    Wrist extension    Wrist ulnar deviation    Wrist radial deviation    Wrist pronation    Wrist supination    Grip strength (lbs)    (Blank rows = not tested)  SHOULDER SPECIAL TESTS: 02/16/2023 No tested today  JOINT MOBILITY TESTING:  02/16/2023 No  testing today  PALPATION:  02/16/2023 General tenderness to light touch around shoulder joint.                                                                                                                                                                                                   TODAY'S TREATMENT:                                                                                                        DATE: 02/21/2023 Therex: Pulleys 2.5 minutes flexion, 2 minutes scaption UBE L1 2 min fwd, 2 min retro Supine AAROM chest press into shoulder flexion X 10 Supine AAROM shoulder abduction X 10 Supine scapular retractions 3 sec X10 Pendulums X 15, front to back, side to side, circles   Manual Rt shoulder passive range in flexion, scaption, abduction to tolerance.  DATE: 02/16/2023 Therex:    HEP instruction/performance c cues for techniques, handout provided.  Trial set performed of each for comprehension and symptom assessment.  See below for exercise list  Manual Rt shoulder passive range in flexion, scaption, abduction to tolerance.    PATIENT EDUCATION: 02/16/2023 Education details: HEP, POC Person educated: Patient Education method: Programmer, multimedia, Demonstration, Verbal cues, and Handouts Education comprehension: verbalized understanding, returned demonstration, and verbal cues required  HOME EXERCISE PROGRAM: Access Code: [email or web address] URL: [email or web address] Date: 02/16/2023 Prepared by: Chyrel Masson  Exercises - Seated Scapular Retraction  - 3-5 x daily - 7 x weekly - 1 sets - 10 reps - 3-5 hold - Standing Horizontal Shoulder Pendulum Supported with Arm Bent  - 2-3 x daily - 7 x weekly - 1-2 sets - 10 reps - Standing Flexion Extension Shoulder Pendulum Supported with Arm Bent  - 2-3 x daily - 7 x weekly - 1-2 sets - 10 reps - Supine Shoulder Flexion Extension AAROM with Dowel  - 2-3 x daily - 7 x weekly - 1-2 sets - 10-15 reps - 3  hold  ASSESSMENT:  CLINICAL IMPRESSION: She showed good understanding and return demo of HEP review today. ROM is doing fairly well up to this point. She will continue to benefit from skilled  PT.  OBJECTIVE IMPAIRMENTS: decreased activity tolerance, decreased coordination, decreased endurance, decreased mobility, decreased ROM, decreased strength, hypomobility, increased edema, increased fascial restrictions, impaired perceived functional ability, increased muscle spasms, impaired flexibility, impaired UE functional use, improper body mechanics, and pain.   ACTIVITY LIMITATIONS: carrying, lifting, bending, sleeping, bed mobility, bathing, toileting, dressing, self feeding, reach over head, and hygiene/grooming  PARTICIPATION LIMITATIONS: meal prep, cleaning, laundry, interpersonal relationship, driving, shopping, and community activity  PERSONAL FACTORS:  No specific factors  are also affecting patient's functional outcome.   REHAB POTENTIAL: Good  CLINICAL DECISION MAKING: Stable/uncomplicated  EVALUATION COMPLEXITY: Low   GOALS: Goals reviewed with patient? Yes  SHORT TERM GOALS: (target date for Short term goals are 3 weeks 03/09/2023)  1.Patient will demonstrate independent use of home exercise program to maintain progress from in clinic treatments. Goal status: New  LONG TERM GOALS: (target dates for all long term goals are 10 weeks  04/27/2023 )   1. Patient will demonstrate/report pain at worst less than or equal to 2/10 to facilitate minimal limitation in daily activity secondary to pain symptoms. Goal status: New   2. Patient will demonstrate independent use of home exercise program to facilitate ability to maintain/progress functional gains from skilled physical therapy services. Goal status: New   3. Patient will demonstrate FOTO outcome > or = 61 % to indicate reduced disability due to condition. Goal status: New   4.  Patient will demonstrate Rt UE MMT 4/5  throughout to facilitate lifting, reaching, carrying at [facility] in daily activity.   Goal status: New   5.  Patient will demonstrate Rt GH joint AROM WFL s symptoms to facilitate usual overhead reaching, self care, dressing at PLOF.    Goal status: New   6.  Patient will demonstrate/report ability to perform work at [employer].  Goal status: New    PLAN:  PT FREQUENCY: 1-2x/week  PT DURATION: 10 weeks  PLANNED INTERVENTIONS: Therapeutic exercises, Therapeutic activity, Neuro Muscular re-education, Balance training, Gait training, Patient/Family education, Joint mobilization, Stair training, DME instructions, Dry Needling, Electrical stimulation, Traction, Cryotherapy, vasopneumatic device Moist heat, Taping, Ultrasound, Ionotophoresis 4mg /ml Dexamethasone, and aquatic therapy ,Manual therapy.  All included unless contraindicated  PLAN FOR NEXT SESSION: Review HEP knowledge/results.    PROM/AROM, add in deltoid isometrics.  No ER past 30 degrees, No IR activation/strengthening.   Ivery Quale, PT, DPT 02/21/23 4:01 PM

## 2023-02-23 ENCOUNTER — Other Ambulatory Visit: Payer: Self-pay | Admitting: Hematology

## 2023-02-23 ENCOUNTER — Other Ambulatory Visit: Payer: Self-pay

## 2023-02-23 MED ORDER — LENALIDOMIDE 10 MG PO CAPS: 10.0000 mg | ORAL_CAPSULE | Freq: Every day | ORAL | 0 refills | Status: DC

## 2023-02-24 ENCOUNTER — Encounter: Payer: BC Managed Care – PPO | Admitting: Rehabilitative and Restorative Service Providers"

## 2023-02-24 ENCOUNTER — Encounter: Payer: Self-pay | Admitting: Rehabilitative and Restorative Service Providers"

## 2023-02-24 ENCOUNTER — Ambulatory Visit (INDEPENDENT_AMBULATORY_CARE_PROVIDER_SITE_OTHER): Payer: BC Managed Care – PPO | Admitting: Rehabilitative and Restorative Service Providers"

## 2023-02-24 DIAGNOSIS — R600 Localized edema: Secondary | ICD-10-CM | POA: Diagnosis not present

## 2023-02-24 DIAGNOSIS — M25511 Pain in right shoulder: Secondary | ICD-10-CM | POA: Diagnosis not present

## 2023-02-24 DIAGNOSIS — M6281 Muscle weakness (generalized): Secondary | ICD-10-CM

## 2023-02-24 DIAGNOSIS — G8929 Other chronic pain: Secondary | ICD-10-CM | POA: Diagnosis not present

## 2023-02-24 NOTE — Therapy (Signed)
OUTPATIENT PHYSICAL THERAPY TREATMENT   Patient Name: Hannah [name] MRN: [id number] DOB:11/25/1960, 62 y.o., female Today's Date: 02/24/2023  END OF SESSION:  PT End of Session - 02/24/23 0942     Visit Number 3    Number of Visits 20    Date for PT Re-Evaluation 04/27/23    Authorization Type [employer] 20% coinsurance    Progress Note Due on Visit 10    PT Start Time [phone]    PT Stop Time 1016    PT Time Calculation (min) 38 min    Activity Tolerance Patient tolerated treatment well    Behavior During Therapy [facility] for tasks assessed/performed              Past Medical History:  Diagnosis Date   Allergy    Anemia    Anxiety    Arthritis    Asthma    Cancer (HCC)    multiple myeloma   Depression    Hypertension    Past Surgical History:  Procedure Laterality Date   ABDOMINAL HYSTERECTOMY     BICEPT TENODESIS Right 01/31/2023   Procedure: BICEPS TENODESIS;  Surgeon: Cammy Copa, MD;  Location: [facility] OR;  Service: Orthopedics;  Laterality: Right;   CHOLECYSTECTOMY  1993   COLONOSCOPY     HERNIA REPAIR     REVERSE SHOULDER ARTHROPLASTY Right 01/31/2023   Procedure: REVERSE SHOULDER ARTHROPLASTY;  Surgeon: Cammy Copa, MD;  Location: [facility] OR;  Service: Orthopedics;  Laterality: Right;   TOTAL KNEE ARTHROPLASTY Right 01/28/2016   TOTAL KNEE ARTHROPLASTY Right 01/28/2016   Procedure: RIGHT TOTAL KNEE ARTHROPLASTY;  Surgeon: Tarry Kos, MD;  Location: [facility] OR;  Service: Orthopedics;  Laterality: Right;   TRIGGER FINGER RELEASE Right 06/18/2014   Procedure: RIGHT LONG FINGER TRIGGER RELEASE;  Surgeon: Cheral Almas, MD;  Location: [facility];  Service: Orthopedics;  Laterality: Right;   TUBAL LIGATION     VAGINAL HYSTERECTOMY Bilateral 11/04/2016   Procedure: HYSTERECTOMY VAGINAL with Bilateral Salpingectomy;  Surgeon: Hal Morales, MD;  Location: [facility];  Service: Gynecology;  Laterality: Bilateral;   WEIL OSTEOTOMY Right 07/07/2020    Procedure: RIGHT FOOT WEIL OSTEOTOMY 2, 3, AND 4 METATARSALS AND PROXIMAL INTERPHALANGEAL JOINT FUSION 2 & 4 TOES;  Surgeon: Nadara Mustard, MD;  Location: [facility];  Service: Orthopedics;  Laterality: Right;   Patient Active Problem List   Diagnosis Date Noted   Biceps tendonitis on right 02/11/2023   S/P reverse total shoulder arthroplasty, right 01/31/2023   Multiple myeloma in remission (HCC) 07/13/2020   Claw toe, right    Trigger finger, left middle finger 04/09/2020   Stenosing tenosynovitis of finger of left hand 02/27/2020   Hemoglobin C trait (HCC) 02/27/2018   Major depression, recurrent (HCC) 01/12/2018   Chronic right shoulder pain 12/20/2017   MDD (major depressive disorder), recurrent episode, moderate (HCC) 11/04/2017   Primary osteoarthritis of both hands 09/26/2017   Primary osteoarthritis of right shoulder 09/26/2017   Primary osteoarthritis of both hips 09/26/2017   Status post total knee replacement, right 09/26/2017   Primary osteoarthritis of both feet 09/26/2017   History of asthma 09/26/2017   Primary osteoarthritis of left knee 04/18/2017   Menorrhagia 11/04/2016   Fibroid tumor 11/04/2016   Adenomyosis 11/04/2016   Hypertension, essential 11/04/2016   Total knee replacement status 01/28/2016    PCP: Gaspar Garbe MD  REFERRING PROVIDER: Julieanne Cotton, PA-C  REFERRING DIAG: [phone] (ICD-10-CM) -  S/P reverse total shoulder arthroplasty, right  THERAPY DIAG:  Chronic right shoulder pain  Muscle weakness (generalized)  Localized edema  Rationale for Evaluation and Treatment: Rehabilitation  ONSET DATE: surgery 01/31/2023  SUBJECTIVE:                                                                                                                                                                                      SUBJECTIVE STATEMENT: Pt indicated 5/10 at most in last day. No specific pain upon arrival.   PERTINENT  HISTORY: PMH anxiety CA myeloma depression HTN Rt TKA   PAIN:  NPRS scale: at worst 5/10 Pain location: Rt shoulder, general shoulder  Pain description: achy Aggravating factors: arm movements, arm handing out of sling.  Relieving factors: pain med, OTC medicine  PRECAUTIONS: Shoulder - "Do not want to externally rotate past 30 degrees to protect subscapularis repair "  WEIGHT BEARING RESTRICTIONS: Yes Rt shoulder  FALLS:  Has patient fallen in last 6 months? No  LIVING ENVIRONMENT: Lives in: House/apartment   OCCUPATION: Work from home on computer.   PLOF: Independent, light gardening. Walking for exercise.   PATIENT GOALS: Reduce pain, get arm back to ROM/use.   OBJECTIVE:   PATIENT SURVEYS:  02/16/2023 FOTO intake:  29   predicted:  61  COGNITION: 02/16/2023 Overall cognitive status: WFL     SENSATION: 02/16/2023 WFL  POSTURE: 02/16/2023 No specific.   UPPER EXTREMITY ROM:   ROM Right 02/16/2023 AROM (PROM) In supine  Right 02/24/2023 AROM in supine  Shoulder flexion 50 (90) 100  Shoulder extension    Shoulder abduction (62)   Shoulder adduction    Shoulder internal rotation To belly   Shoulder external rotation NT   Elbow flexion    Elbow extension    Wrist flexion    Wrist extension    Wrist ulnar deviation    Wrist radial deviation    Wrist pronation    Wrist supination    (Blank rows = not tested)  UPPER EXTREMITY MMT:  MMT Right 02/16/2023 Left 02/16/2023  Shoulder flexion NT 5/5  Shoulder extension    Shoulder abduction NT 5/5  Shoulder adduction    Shoulder internal rotation NT 5/5  Shoulder external rotation NT 5/5  Middle trapezius    Lower trapezius    Elbow flexion  5/5  Elbow extension  5/5  Wrist flexion    Wrist extension    Wrist ulnar deviation    Wrist radial deviation    Wrist pronation    Wrist supination    Grip strength (lbs)    (Blank rows = not tested)  SHOULDER SPECIAL TESTS: 02/16/2023 No  tested  today  JOINT MOBILITY TESTING:  02/16/2023 No testing today  PALPATION:  02/16/2023 General tenderness to light touch around shoulder joint.                                                                                                                                                                                                   TODAY'S TREATMENT:                                                                                        DATE: 02/24/2023 Therex: UBE Lvl  1 3 mins fwd/back each way  Pulley flexion, scaption 3 mins each way Supine AAROM wand x 15 Isometric flexion, abduction Rt shoulder  5 sec hold x 5 each with cues for home use.   Manual Rt shoulder passive range in flexion, scaption, abduction to tolerance.  TODAY'S TREATMENT:                                                                                        DATE: 02/21/2023 Therex: Pulleys 2.5 minutes flexion, 2 minutes scaption UBE L1 2 min fwd, 2 min retro Supine AAROM chest press into shoulder flexion X 10 Supine AAROM shoulder abduction X 10 Supine scapular retractions 3 sec X10 Pendulums X 15, front to back, side to side, circles  Manual Rt shoulder passive range in flexion, scaption, abduction to tolerance.  TODAY'S TREATMENT:  DATE: 02/16/2023 Therex:    HEP instruction/performance c cues for techniques, handout provided.  Trial set performed of each for comprehension and symptom assessment.  See below for exercise list  Manual Rt shoulder passive range in flexion, scaption, abduction to tolerance.    PATIENT EDUCATION: 02/24/2023 Education details: HEP update Person educated: Patient Education method: Programmer, multimedia, Demonstration, Verbal cues, and Handouts Education comprehension: verbalized understanding, returned demonstration, and verbal cues required  HOME EXERCISE PROGRAM: Access Code: [email or web address] URL:  [email or web address] Date: 02/24/2023 Prepared by: Chyrel Masson  Exercises - Seated Scapular Retraction  - 3-5 x daily - 7 x weekly - 1 sets - 10 reps - 3-5 hold - Standing Horizontal Shoulder Pendulum Supported with Arm Bent  - 2-3 x daily - 7 x weekly - 1-2 sets - 10 reps - Standing Flexion Extension Shoulder Pendulum Supported with Arm Bent  - 2-3 x daily - 7 x weekly - 1-2 sets - 10 reps - Supine Shoulder Flexion Extension AAROM with Dowel  - 2-3 x daily - 7 x weekly - 1-2 sets - 10-15 reps - 3 hold - Standing Isometric Shoulder Flexion with Doorway - Arm Bent  - 1-2 x daily - 7 x weekly - 1 sets - 10 reps - 5-10 hold - Standing Isometric Shoulder Abduction with Doorway - Arm Bent (Mirrored)  - 1-2 x daily - 7 x weekly - 1 sets - 10 reps - 5-10 hold  ASSESSMENT:  CLINICAL IMPRESSION: Reassessment showed improvement in supine active flexion.  Pt to continue to benefit from progressive passive and AAROM rains gain.  Continued skilled PT services indicated at this time.   OBJECTIVE IMPAIRMENTS: decreased activity tolerance, decreased coordination, decreased endurance, decreased mobility, decreased ROM, decreased strength, hypomobility, increased edema, increased fascial restrictions, impaired perceived functional ability, increased muscle spasms, impaired flexibility, impaired UE functional use, improper body mechanics, and pain.   ACTIVITY LIMITATIONS: carrying, lifting, bending, sleeping, bed mobility, bathing, toileting, dressing, self feeding, reach over head, and hygiene/grooming  PARTICIPATION LIMITATIONS: meal prep, cleaning, laundry, interpersonal relationship, driving, shopping, and community activity  PERSONAL FACTORS:  No specific factors  are also affecting patient's functional outcome.   REHAB POTENTIAL: Good  CLINICAL DECISION MAKING: Stable/uncomplicated  EVALUATION COMPLEXITY: Low   GOALS: Goals reviewed with patient? Yes  SHORT TERM GOALS: (target  date for Short term goals are 3 weeks 03/09/2023)  1.Patient will demonstrate independent use of home exercise program to maintain progress from in clinic treatments. Goal status: on going 02/24/2023  LONG TERM GOALS: (target dates for all long term goals are 10 weeks  04/27/2023 )   1. Patient will demonstrate/report pain at worst less than or equal to 2/10 to facilitate minimal limitation in daily activity secondary to pain symptoms. Goal status: New   2. Patient will demonstrate independent use of home exercise program to facilitate ability to maintain/progress functional gains from skilled physical therapy services. Goal status: New   3. Patient will demonstrate FOTO outcome > or = 61 % to indicate reduced disability due to condition. Goal status: New   4.  Patient will demonstrate Rt UE MMT 4/5 throughout to facilitate lifting, reaching, carrying at [facility] in daily activity.   Goal status: New   5.  Patient will demonstrate Rt GH joint AROM WFL s symptoms to facilitate usual overhead reaching, self care, dressing at PLOF.    Goal status: New   6.  Patient will demonstrate/report ability to perform work at [employer].  Goal status: New  PLAN:  PT FREQUENCY: 1-2x/week  PT DURATION: 10 weeks  PLANNED INTERVENTIONS: Therapeutic exercises, Therapeutic activity, Neuro Muscular re-education, Balance training, Gait training, Patient/Family education, Joint mobilization, Stair training, DME instructions, Dry Needling, Electrical stimulation, Traction, Cryotherapy, vasopneumatic device Moist heat, Taping, Ultrasound, Ionotophoresis 4mg /ml Dexamethasone, and aquatic therapy ,Manual therapy.  All included unless contraindicated  PLAN FOR NEXT SESSION: PROM, AAROM gains.   No ER past 30 degrees, No IR activation/strengthening.    Chyrel Masson, PT, DPT, OCS, ATC 02/24/23  10:23 AM

## 2023-02-25 ENCOUNTER — Encounter: Payer: Self-pay | Admitting: Hematology

## 2023-02-27 NOTE — Telephone Encounter (Signed)
It looks like her appointment is for physical therapy? Though I'm not 100% sure. If they don't address her lower back pain, then I think we should see her. She doesn't have next appointment here until 03/2023.

## 2023-02-28 ENCOUNTER — Telehealth: Payer: Self-pay | Admitting: Hematology

## 2023-03-01 ENCOUNTER — Ambulatory Visit (INDEPENDENT_AMBULATORY_CARE_PROVIDER_SITE_OTHER): Payer: BC Managed Care – PPO | Admitting: Rehabilitative and Restorative Service Providers"

## 2023-03-01 ENCOUNTER — Encounter: Payer: Self-pay | Admitting: Rehabilitative and Restorative Service Providers"

## 2023-03-01 DIAGNOSIS — M25511 Pain in right shoulder: Secondary | ICD-10-CM

## 2023-03-01 DIAGNOSIS — M6281 Muscle weakness (generalized): Secondary | ICD-10-CM

## 2023-03-01 DIAGNOSIS — G8929 Other chronic pain: Secondary | ICD-10-CM

## 2023-03-01 DIAGNOSIS — R600 Localized edema: Secondary | ICD-10-CM | POA: Diagnosis not present

## 2023-03-01 NOTE — Therapy (Signed)
OUTPATIENT PHYSICAL THERAPY TREATMENT   Patient Name: Hannah [name] MRN: [id number] DOB:06/15/1960, 62 y.o., female Today's Date: 03/01/2023  END OF SESSION:  PT End of Session - 03/01/23 1145     Visit Number 4    Number of Visits 20    Date for PT Re-Evaluation 04/27/23    Authorization Type [employer] 20% coinsurance    Progress Note Due on Visit 10    PT Start Time 1140    PT Stop Time 1220    PT Time Calculation (min) 40 min    Activity Tolerance Patient tolerated treatment well    Behavior During Therapy WFL for tasks assessed/performed               Past Medical History:  Diagnosis Date   Allergy    Anemia    Anxiety    Arthritis    Asthma    Cancer (HCC)    multiple myeloma   Depression    Hypertension    Past Surgical History:  Procedure Laterality Date   ABDOMINAL HYSTERECTOMY     BICEPT TENODESIS Right 01/31/2023   Procedure: BICEPS TENODESIS;  Surgeon: Cammy Copa, MD;  Location: [facility] OR;  Service: Orthopedics;  Laterality: Right;   CHOLECYSTECTOMY  1993   COLONOSCOPY     HERNIA REPAIR     REVERSE SHOULDER ARTHROPLASTY Right 01/31/2023   Procedure: REVERSE SHOULDER ARTHROPLASTY;  Surgeon: Cammy Copa, MD;  Location: [facility] OR;  Service: Orthopedics;  Laterality: Right;   TOTAL KNEE ARTHROPLASTY Right 01/28/2016   TOTAL KNEE ARTHROPLASTY Right 01/28/2016   Procedure: RIGHT TOTAL KNEE ARTHROPLASTY;  Surgeon: Tarry Kos, MD;  Location: [facility] OR;  Service: Orthopedics;  Laterality: Right;   TRIGGER FINGER RELEASE Right 06/18/2014   Procedure: RIGHT LONG FINGER TRIGGER RELEASE;  Surgeon: Cheral Almas, MD;  Location: [facility];  Service: Orthopedics;  Laterality: Right;   TUBAL LIGATION     VAGINAL HYSTERECTOMY Bilateral 11/04/2016   Procedure: HYSTERECTOMY VAGINAL with Bilateral Salpingectomy;  Surgeon: Hal Morales, MD;  Location: [facility];  Service: Gynecology;  Laterality: Bilateral;   WEIL OSTEOTOMY Right 07/07/2020    Procedure: RIGHT FOOT WEIL OSTEOTOMY 2, 3, AND 4 METATARSALS AND PROXIMAL INTERPHALANGEAL JOINT FUSION 2 & 4 TOES;  Surgeon: Nadara Mustard, MD;  Location: [facility];  Service: Orthopedics;  Laterality: Right;   Patient Active Problem List   Diagnosis Date Noted   Biceps tendonitis on right 02/11/2023   S/P reverse total shoulder arthroplasty, right 01/31/2023   Multiple myeloma in remission (HCC) 07/13/2020   Claw toe, right    Trigger finger, left middle finger 04/09/2020   Stenosing tenosynovitis of finger of left hand 02/27/2020   Hemoglobin C trait (HCC) 02/27/2018   Major depression, recurrent (HCC) 01/12/2018   Chronic right shoulder pain 12/20/2017   MDD (major depressive disorder), recurrent episode, moderate (HCC) 11/04/2017   Primary osteoarthritis of both hands 09/26/2017   Primary osteoarthritis of right shoulder 09/26/2017   Primary osteoarthritis of both hips 09/26/2017   Status post total knee replacement, right 09/26/2017   Primary osteoarthritis of both feet 09/26/2017   History of asthma 09/26/2017   Primary osteoarthritis of left knee 04/18/2017   Menorrhagia 11/04/2016   Fibroid tumor 11/04/2016   Adenomyosis 11/04/2016   Hypertension, essential 11/04/2016   Total knee replacement status 01/28/2016    PCP: Gaspar Garbe MD  REFERRING PROVIDER: Julieanne Cotton, PA-C  REFERRING DIAG: [phone] (ICD-10-CM) -  S/P reverse total shoulder arthroplasty, right  THERAPY DIAG:  Chronic right shoulder pain  Muscle weakness (generalized)  Localized edema  Rationale for Evaluation and Treatment: Rehabilitation  ONSET DATE: surgery 01/31/2023  SUBJECTIVE:                                                                                                                                                                                      SUBJECTIVE STATEMENT: Pt indicated 4/10 on shoulder.  Pt indicated increased activity can increase pain.   Reported having back/hip pain complaints.  Will have follow up with oncology due to cancer history.   PERTINENT HISTORY: PMH anxiety CA myeloma depression HTN Rt TKA   PAIN:  NPRS scale: 4/10.   Pain location: Rt shoulder, general shoulder  Pain description: achy Aggravating factors: arm movements, arm handing out of sling.  Relieving factors: pain med, OTC medicine  PRECAUTIONS: Shoulder - "Do not want to externally rotate past 30 degrees to protect subscapularis repair "  WEIGHT BEARING RESTRICTIONS: Yes Rt shoulder  FALLS:  Has patient fallen in last 6 months? No  LIVING ENVIRONMENT: Lives in: House/apartment   OCCUPATION: Work from home on computer.   PLOF: Independent, light gardening. Walking for exercise.   PATIENT GOALS: Reduce pain, get arm back to ROM/use.   OBJECTIVE:   PATIENT SURVEYS:  02/16/2023 FOTO intake:  29   predicted:  61  COGNITION: 02/16/2023 Overall cognitive status: WFL     SENSATION: 02/16/2023 WFL  POSTURE: 02/16/2023 No specific.   UPPER EXTREMITY ROM:   ROM Right 02/16/2023 AROM (PROM) In supine  Right 02/24/2023 AROM in supine Right 03/01/2023 AROM in supine   Shoulder flexion 50 (90) 100 112  Shoulder extension     Shoulder abduction (62)    Shoulder adduction     Shoulder internal rotation To belly    Shoulder external rotation NT    Elbow flexion     Elbow extension     Wrist flexion     Wrist extension     Wrist ulnar deviation     Wrist radial deviation     Wrist pronation     Wrist supination     (Blank rows = not tested)  UPPER EXTREMITY MMT:  MMT Right 02/16/2023 Left 02/16/2023  Shoulder flexion NT 5/5  Shoulder extension    Shoulder abduction NT 5/5  Shoulder adduction    Shoulder internal rotation NT 5/5  Shoulder external rotation NT 5/5  Middle trapezius    Lower trapezius    Elbow flexion  5/5  Elbow extension  5/5  Wrist flexion    Wrist extension    Wrist ulnar  deviation    Wrist radial  deviation    Wrist pronation    Wrist supination    Grip strength (lbs)    (Blank rows = not tested)  SHOULDER SPECIAL TESTS: 02/16/2023 No tested today  JOINT MOBILITY TESTING:  02/16/2023 No testing today  PALPATION:  02/16/2023 General tenderness to light touch around shoulder joint.                                                                                                                                                                                                   TODAY'S TREATMENT:                                                                                        DATE: 03/01/2023 Therex: UBE Lvl  1.5  3 mins fwd/back each way  Standing lumbar extension x 5 AROM (cues for home) Supine lumbar trunk rotation 15 sec x 3 bilateral (cues for home) Supine AROM flexion 2 x 10 to tolerance  Pulley flexion, scaption 3 mins each way Tband rows green 2 x 15  Manual Rt shoulder passive range in flexion, scaption, abduction to tolerance.  TODAY'S TREATMENT:                                                                                        DATE: 02/24/2023 Therex: UBE Lvl  1 3 mins fwd/back each way  Pulley flexion, scaption 3 mins each way Supine AAROM wand x 15 Isometric flexion, abduction Rt shoulder  5 sec hold x 5 each with cues for home use.   Manual Rt shoulder passive range in flexion, scaption, abduction to tolerance.  TODAY'S TREATMENT:  DATE: 02/21/2023 Therex: Pulleys 2.5 minutes flexion, 2 minutes scaption UBE L1 2 min fwd, 2 min retro Supine AAROM chest press into shoulder flexion X 10 Supine AAROM shoulder abduction X 10 Supine scapular retractions 3 sec X10 Pendulums X 15, front to back, side to side, circles  Manual Rt shoulder passive range in flexion, scaption, abduction to tolerance.  TODAY'S TREATMENT:                                                                                         DATE: 02/16/2023 Therex:    HEP instruction/performance c cues for techniques, handout provided.  Trial set performed of each for comprehension and symptom assessment.  See below for exercise list  Manual Rt shoulder passive range in flexion, scaption, abduction to tolerance.    PATIENT EDUCATION: 02/24/2023 Education details: HEP update Person educated: Patient Education method: Programmer, multimedia, Demonstration, Verbal cues, and Handouts Education comprehension: verbalized understanding, returned demonstration, and verbal cues required  HOME EXERCISE PROGRAM: Access Code: [email or web address] URL: [email or web address] Date: 02/24/2023 Prepared by: Chyrel Masson  Exercises - Seated Scapular Retraction  - 3-5 x daily - 7 x weekly - 1 sets - 10 reps - 3-5 hold - Standing Horizontal Shoulder Pendulum Supported with Arm Bent  - 2-3 x daily - 7 x weekly - 1-2 sets - 10 reps - Standing Flexion Extension Shoulder Pendulum Supported with Arm Bent  - 2-3 x daily - 7 x weekly - 1-2 sets - 10 reps - Supine Shoulder Flexion Extension AAROM with Dowel  - 2-3 x daily - 7 x weekly - 1-2 sets - 10-15 reps - 3 hold - Standing Isometric Shoulder Flexion with Doorway - Arm Bent  - 1-2 x daily - 7 x weekly - 1 sets - 10 reps - 5-10 hold - Standing Isometric Shoulder Abduction with Doorway - Arm Bent (Mirrored)  - 1-2 x daily - 7 x weekly - 1 sets - 10 reps - 5-10 hold  ASSESSMENT:  CLINICAL IMPRESSION: Continued gains in general movement Rt shoulder.  Back complaints noted and discussed with easy stretching.  Will have follow up with oncology as well due to cancer history.  Continued plan for Rt shoulder active range improvements warranted.   OBJECTIVE IMPAIRMENTS: decreased activity tolerance, decreased coordination, decreased endurance, decreased mobility, decreased ROM, decreased strength, hypomobility, increased edema, increased fascial restrictions, impaired perceived  functional ability, increased muscle spasms, impaired flexibility, impaired UE functional use, improper body mechanics, and pain.   ACTIVITY LIMITATIONS: carrying, lifting, bending, sleeping, bed mobility, bathing, toileting, dressing, self feeding, reach over head, and hygiene/grooming  PARTICIPATION LIMITATIONS: meal prep, cleaning, laundry, interpersonal relationship, driving, shopping, and community activity  PERSONAL FACTORS:  No specific factors  are also affecting patient's functional outcome.   REHAB POTENTIAL: Good  CLINICAL DECISION MAKING: Stable/uncomplicated  EVALUATION COMPLEXITY: Low   GOALS: Goals reviewed with patient? Yes  SHORT TERM GOALS: (target date for Short term goals are 3 weeks 03/09/2023)  1.Patient will demonstrate independent use of home exercise program to maintain progress from in clinic treatments. Goal status: on going 02/24/2023  LONG TERM GOALS: (target dates for all long term goals  are 10 weeks  04/27/2023 )   1. Patient will demonstrate/report pain at worst less than or equal to 2/10 to facilitate minimal limitation in daily activity secondary to pain symptoms. Goal status: New   2. Patient will demonstrate independent use of home exercise program to facilitate ability to maintain/progress functional gains from skilled physical therapy services. Goal status: New   3. Patient will demonstrate FOTO outcome > or = 61 % to indicate reduced disability due to condition. Goal status: New   4.  Patient will demonstrate Rt UE MMT 4/5 throughout to facilitate lifting, reaching, carrying at [facility] in daily activity.   Goal status: New   5.  Patient will demonstrate Rt GH joint AROM WFL s symptoms to facilitate usual overhead reaching, self care, dressing at PLOF.    Goal status: New   6.  Patient will demonstrate/report ability to perform work at [employer].  Goal status: New    PLAN:  PT FREQUENCY: 1-2x/week  PT DURATION: 10 weeks  PLANNED  INTERVENTIONS: Therapeutic exercises, Therapeutic activity, Neuro Muscular re-education, Balance training, Gait training, Patient/Family education, Joint mobilization, Stair training, DME instructions, Dry Needling, Electrical stimulation, Traction, Cryotherapy, vasopneumatic device Moist heat, Taping, Ultrasound, Ionotophoresis 4mg /ml Dexamethasone, and aquatic therapy ,Manual therapy.  All included unless contraindicated  PLAN FOR NEXT SESSION: AAROM/AAROM  No ER past 30 degrees, No IR activation/strengthening.    Chyrel Masson, PT, DPT, OCS, ATC 03/01/23  12:23 PM

## 2023-03-03 ENCOUNTER — Encounter: Payer: BC Managed Care – PPO | Admitting: Rehabilitative and Restorative Service Providers"

## 2023-03-05 NOTE — Assessment & Plan Note (Signed)
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   -bone marrow biopsy on 07/30/20 showed slightly hypercellular marrow for age and 31% plasma cells. Cytogenetics and FISH were unremarkable -she received VRD (weekly Velcade and dexamethasone, Revlimid 2 weeks on 1 week off) 08/17/20 - June 2022. She responded well. -She underwent high-dose chemo with melphalan and autologous stem cell transplant at [facility] 12/23/20 by Dr. Rosaria Ferries.  White cells engrafted 01/03/21 and platelets 01/13/21, last platelet transfusion 01/02/21 -posttreatment PET 04/06/21 showed NED  -posttreatment bone marrow biopsy 04/29/21 showed 10-20% hypocellular marrow with myeloid hypoplasia and 3% plasma cells -s/p prophylactic valacyclovir for 1 year and dapsone for 6 months posttransplant -she will continue revaccination per [facility] protocol -she began maintenance Revlimid 10 mg daily days 1-21 q. 28 days on 05/03/21. She is tolerating well with no noticeable side effects. will continue indefinitely or until disease progression. -Her last MM panel showed negative M-protein, but slightly increase Kappa light chain level in 12/2022, overall  stable  -Revlimid was briefly held around her shoulder replacement in 27 February 2023.  She has restarted.

## 2023-03-06 ENCOUNTER — Inpatient Hospital Stay: Payer: BC Managed Care – PPO | Attending: Hematology

## 2023-03-06 ENCOUNTER — Telehealth: Payer: Self-pay | Admitting: Orthopedic Surgery

## 2023-03-06 ENCOUNTER — Inpatient Hospital Stay: Payer: BC Managed Care – PPO

## 2023-03-06 ENCOUNTER — Inpatient Hospital Stay (HOSPITAL_BASED_OUTPATIENT_CLINIC_OR_DEPARTMENT_OTHER): Payer: BC Managed Care – PPO | Admitting: Hematology

## 2023-03-06 ENCOUNTER — Encounter: Payer: Self-pay | Admitting: Hematology

## 2023-03-06 VITALS — BP 142/85 | HR 81 | Temp 98.7°F | Resp 15 | Ht 65.0 in | Wt 226.3 lb

## 2023-03-06 DIAGNOSIS — Z9484 Stem cells transplant status: Secondary | ICD-10-CM | POA: Diagnosis not present

## 2023-03-06 DIAGNOSIS — Z7961 Long term (current) use of immunomodulator: Secondary | ICD-10-CM | POA: Insufficient documentation

## 2023-03-06 DIAGNOSIS — Z882 Allergy status to sulfonamides status: Secondary | ICD-10-CM | POA: Diagnosis not present

## 2023-03-06 DIAGNOSIS — C9001 Multiple myeloma in remission: Secondary | ICD-10-CM | POA: Diagnosis not present

## 2023-03-06 DIAGNOSIS — Z96619 Presence of unspecified artificial shoulder joint: Secondary | ICD-10-CM | POA: Insufficient documentation

## 2023-03-06 DIAGNOSIS — Z9049 Acquired absence of other specified parts of digestive tract: Secondary | ICD-10-CM | POA: Insufficient documentation

## 2023-03-06 DIAGNOSIS — Z9079 Acquired absence of other genital organ(s): Secondary | ICD-10-CM | POA: Insufficient documentation

## 2023-03-06 DIAGNOSIS — Z79899 Other long term (current) drug therapy: Secondary | ICD-10-CM | POA: Insufficient documentation

## 2023-03-06 DIAGNOSIS — I10 Essential (primary) hypertension: Secondary | ICD-10-CM | POA: Diagnosis not present

## 2023-03-06 DIAGNOSIS — Z96611 Presence of right artificial shoulder joint: Secondary | ICD-10-CM

## 2023-03-06 DIAGNOSIS — D509 Iron deficiency anemia, unspecified: Secondary | ICD-10-CM

## 2023-03-06 DIAGNOSIS — Z23 Encounter for immunization: Secondary | ICD-10-CM

## 2023-03-06 DIAGNOSIS — C9000 Multiple myeloma not having achieved remission: Secondary | ICD-10-CM

## 2023-03-06 LAB — CMP (CANCER CENTER ONLY)
ALT: 10 U/L (ref 0–44)
AST: 14 U/L — ABNORMAL LOW (ref 15–41)
Albumin: 3.7 g/dL (ref 3.5–5.0)
Alkaline Phosphatase: 77 U/L (ref 38–126)
Anion gap: 4 — ABNORMAL LOW (ref 5–15)
BUN: 13 mg/dL (ref 6–20)
CO2: 31 mmol/L (ref 22–32)
Calcium: 8.8 mg/dL — ABNORMAL LOW (ref 8.9–10.3)
Chloride: 107 mmol/L (ref 98–111)
Creatinine: 0.83 mg/dL (ref 0.44–1.00)
GFR, Estimated: >60 mL/min (ref >60)
Glucose, Bld: 91 mg/dL (ref 70–99)
Potassium: 3.9 mmol/L (ref 3.5–5.1)
Sodium: 142 mmol/L (ref 135–145)
Total Bilirubin: 0.4 mg/dL (ref 0.3–1.2)
Total Protein: 6.6 g/dL (ref 6.5–8.1)

## 2023-03-06 LAB — IRON AND IRON BINDING CAPACITY (CC-WL,HP ONLY)
Iron: 39 ug/dL (ref 28–170)
Saturation Ratios: 16 % (ref 10.4–31.8)
TIBC: 251 ug/dL (ref 250–450)
UIBC: 212 ug/dL (ref 148–442)

## 2023-03-06 LAB — CBC WITH DIFFERENTIAL (CANCER CENTER ONLY)
Abs Immature Granulocytes: 0.04 10*3/uL (ref 0.00–0.07)
Basophils Absolute: 0 10*3/uL (ref 0.0–0.1)
Basophils Relative: 0 %
Eosinophils Absolute: 0.2 10*3/uL (ref 0.0–0.5)
Eosinophils Relative: 3 %
HCT: 31.6 % — ABNORMAL LOW (ref 36.0–46.0)
Hemoglobin: 10.2 g/dL — ABNORMAL LOW (ref 12.0–15.0)
Immature Granulocytes: 1 %
Lymphocytes Relative: 31 %
Lymphs Abs: 1.6 10*3/uL (ref 0.7–4.0)
MCH: 24.5 pg — ABNORMAL LOW (ref 26.0–34.0)
MCHC: 32.3 g/dL (ref 30.0–36.0)
MCV: 75.8 fL — ABNORMAL LOW (ref 80.0–100.0)
Monocytes Absolute: 0.5 10*3/uL (ref 0.1–1.0)
Monocytes Relative: 8 %
Neutro Abs: 3.1 10*3/uL (ref 1.7–7.7)
Neutrophils Relative %: 57 %
Platelet Count: 163 10*3/uL (ref 150–400)
RBC: 4.17 MIL/uL (ref 3.87–5.11)
RDW: 17.1 % — ABNORMAL HIGH (ref 11.5–15.5)
WBC Count: 5.4 10*3/uL (ref 4.0–10.5)
nRBC: 0 % (ref 0.0–0.2)

## 2023-03-06 MED ORDER — INFLUENZA VIRUS VACC SPLIT PF (FLUZONE) 0.5 ML IM SUSY
0.5000 mL | PREFILLED_SYRINGE | Freq: Once | INTRAMUSCULAR | Status: AC
  Administered 2023-03-06: 0.5 mL via INTRAMUSCULAR
  Filled 2023-03-06: qty 0.5

## 2023-03-06 NOTE — Telephone Encounter (Signed)
Pt  is calling to request a water therapy referral & also reporting back and hip pain.

## 2023-03-06 NOTE — Progress Notes (Signed)
[facility]   Telephone:([phone] [phone]) [phone]   Clinic Follow up Note   [name] Care Team: Tisovec, Adelfa Koh, MD as PCP - General (Internal Medicine) Pollyann Savoy, MD as Consulting Physician (Rheumatology) Malachy Mood, MD as Consulting Physician (Hematology) Hoover Browns, MD as Consulting Physician (Obstetrics and Gynecology) Bridgett Larsson, LCSW as Social Worker (Licensed Clinical Social Worker)  Date of Service:  03/06/2023  CHIEF COMPLAINT: f/u of multiple myeloma  CURRENT THERAPY:  Maintenance Revlimid  Oncology History   Multiple myeloma in remission (HCC) IgA Kappa type, stage II, standard risk  -diagnosed with smoldering MM in 2019, initial bone marrow biopsy from 10/2017 showed increased plasma (6% on aspirate, 20% by CD 138); plasma cells in bone marrow likely between 10 to 20%, 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   -bone marrow biopsy on 07/30/20 showed slightly hypercellular marrow for age and 31% plasma cells. Cytogenetics and FISH were unremarkable -she received VRD (weekly Velcade and dexamethasone, Revlimid 2 weeks on 1 week off) 08/17/20 - June 2022. She responded well. -She underwent high-dose chemo with melphalan and autologous stem cell transplant at [facility] 12/23/20 by Dr. Rosaria Ferries.  White cells engrafted 01/03/21 and platelets 01/13/21, last platelet transfusion 01/02/21 -posttreatment PET 04/06/21 showed NED  -posttreatment bone marrow biopsy 04/29/21 showed 10-20% hypocellular marrow with myeloid hypoplasia and 3% plasma cells -s/p prophylactic valacyclovir for 1 year and dapsone for 6 months posttransplant -she will continue revaccination per [facility] protocol -she began maintenance  Revlimid 10 mg daily days 1-21 q. 28 days on 05/03/21. She is tolerating well with no noticeable side effects. will continue indefinitely or until disease progression. -Her last MM panel showed negative M-protein, but slightly increase Kappa light chain level in 12/2022, overall  stable  -Revlimid was briefly held around her shoulder replacement in 27 February 2023.  She has restarted.   Assessment and Plan    Multiple Myeloma Stable disease on recent lab -pt is concerned about recent onset of low back and hip pain. Pain is reminiscent of previous pain associated with multiple myeloma, but also has a history of arthritis in Hannah same area. Pain has improved from a 10/10 to a 4/7 over Hannah past two weeks. -Continue current treatment regimen with Revlimid. -Repeat multiple myeloma lab today  -Consider MRI if MM lab show significant change or if pain does not continue to improve. Pt agrees to hold MRI for now since it's improving   Shoulder Replacement Post-operative recovery with ongoing physical therapy. Pain is tolerable and improving. -Continue physical therapy as directed.  General Health Maintenance -Check on status of flu shot and administer if due. -Continue Valacyclovir and Aspirin as prescribed. -Consider physical therapy for back pain. -Schedule follow-up appointment in two months.      Plan -Will follow-up on her lab results today -She will reach out to her orthopedic surgeon about her back pain -May consider lumbar MRI if pain gets worse again or if her multiple myeloma lab is concerning for disease progression -Lab and follow-up in 2 months   SUMMARY OF ONCOLOGIC HISTORY: Oncology History Overview Note  Cancer Staging Multiple myeloma (HCC) Staging form: Plasma Cell Myeloma and Plasma Cell Disorders, AJCC 8th Edition - Clinical stage from 07/20/2020: Beta-2-microglobulin (mg/L): 2.4, Albumin (  g/dL): 3.2, ISS: Stage II, High-risk cytogenetics: Unknown, LDH: Normal - Signed  by Malachy Mood, MD on 08/02/2020 Beta 2 microglobulin range (mg/L): Less than 3.5 Albumin range (g/dL): Less than 3.5 Cytogenetics: Unknown    Multiple myeloma in remission (HCC)  07/03/2020 Imaging   MRI Lumbar Spine  IMPRESSION: 1. Numerous T1 hypointense and STIR hyperintense lesions throughout Hannah visualized spine and sacrum with multiple areas of extraosseous extension in Hannah sacrum, detailed above and compatible with multiple myeloma given Hannah clinical history. 2. An MRI of Hannah pelvis with contrast could evaluate Hannah full extent of Hannah partially imaged sacral lesions, including left S1-S2 neural foraminal involvement and suspected involvement of Hannah exited left L5 nerve. 3. Multilevel degenerative change without significant canal or foraminal stenosis in Hannah lumbar spine.   07/13/2020 Initial Diagnosis   Multiple myeloma (HCC)   07/20/2020 Cancer Staging   Staging form: Plasma Cell Myeloma and Plasma Cell Disorders, AJCC 8th Edition - Clinical stage from 07/20/2020: Beta-2-microglobulin (mg/L): 2.4, Albumin (g/dL): 3.2, ISS: Stage II, High-risk cytogenetics: Unknown, LDH: Normal - Signed by Malachy Mood, MD on 03/09/2022 Stage prefix: Initial diagnosis Beta 2 microglobulin range (mg/L): Less than 3.5 Albumin range (g/dL): Less than 3.5 Cytogenetics: No abnormalities Bone disease on imaging: Present   07/24/2020 PET scan   IMPRESSION: 1. Moderate to high hypermetabolic activity associated with Hannah expansile soft tissue masses within Hannah LEFT and RIGHT pelvic bones is consistent with active multiple myeloma / plasmacytoma. 2. Lytic lesion in Hannah LEFT scapula with mild metabolic activity is indeterminate. 3. Metabolic activity associated with Hannah RIGHT knee prosthetic and RIGHT distal foot fusion is favored post procedural inflammation.     07/30/2020 - 08/12/2020 Radiation Therapy   Palliative Radiation to Pelvic lesions with Dr Mitzi Hansen    07/30/2020 Pathology Results   DIAGNOSIS:    BONE MARROW, ASPIRATE, CLOT, CORE:  -Normocellular to slightly hypercellular bone marrow for age with  plasmacytosis  -See comment   PERIPHERAL BLOOD:  -Microcytic-normochromic anemia   COMMENT:   Hannah bone marrow shows increased number of atypical plasma cells  representing 31% of all cells in Hannah aspirate associated with prominent  interstitial infiltrates and numerous variably sized clusters in Hannah  clot and biopsy sections.  Hannah findings are most consistent with  persistent/recurrent/previously known plasma cell neoplasm.  For  completeness, immunohistochemical stain for CD138 and in situ  hybridization for kappa and lambda will be performed and Hannah results  reported in an addendum.  Correlation with cytogenetic and FISH studies  is recommended.   08/03/2020 -  Chemotherapy   Zometa q4weeks starting 08/03/20    08/17/2020 - 11/29/2020 Chemotherapy   Velcade weekly, Revlimid, dex 20mg  weekly (VRd)  Starting 08/17/20. Last dose Velcade 11/23/20 and last dose revlimid on 11/29/20       Discussed Hannah use of AI scribe software for clinical note transcription with Hannah [name], who gave verbal consent to proceed.  History of Present Illness   Hannah [name], a 62 year old with a history of multiple myeloma, presents for follow-up after shoulder replacement surgery. She reports that Hannah surgery went well and she is currently undergoing physical therapy. Hannah [name] describes Hannah post-surgical pain as tolerable, with some tightness and occasional swelling, particularly when lifting heavy objects or reaching beyond her physical therapy exercises.  Hannah [name] also reports experiencing back pain that started a couple of weeks ago. Hannah pain is located in Hannah lower back and hip area, sometimes severe enough to impair walking. She  has been managing Hannah pain with Aleve and Tylenol. Hannah [name] has a history of similar pain, which was previously attributed to either arthritis or multiple myeloma. Hannah  pain had resolved after treatment for multiple myeloma but has recently returned. She denies any radiating pain to Hannah legs.  Hannah [name] also mentions a history of a shoulder injury, for which she underwent a shoulder replacement surgery. She is currently undergoing physical therapy for Hannah same and reports improvement.         All other systems were reviewed with Hannah [name] and are negative.  MEDICAL HISTORY:  Past Medical History:  Diagnosis Date   Allergy    Anemia    Anxiety    Arthritis    Asthma    Cancer (HCC)    multiple myeloma   Depression    Hypertension     SURGICAL HISTORY: Past Surgical History:  Procedure Laterality Date   ABDOMINAL HYSTERECTOMY     BICEPT TENODESIS Right 01/31/2023   Procedure: BICEPS TENODESIS;  Surgeon: Cammy Copa, MD;  Location: [facility] OR;  Service: Orthopedics;  Laterality: Right;   CHOLECYSTECTOMY  1993   COLONOSCOPY     HERNIA REPAIR     REVERSE SHOULDER ARTHROPLASTY Right 01/31/2023   Procedure: REVERSE SHOULDER ARTHROPLASTY;  Surgeon: Cammy Copa, MD;  Location: [facility] OR;  Service: Orthopedics;  Laterality: Right;   TOTAL KNEE ARTHROPLASTY Right 01/28/2016   TOTAL KNEE ARTHROPLASTY Right 01/28/2016   Procedure: RIGHT TOTAL KNEE ARTHROPLASTY;  Surgeon: Tarry Kos, MD;  Location: [facility] OR;  Service: Orthopedics;  Laterality: Right;   TRIGGER FINGER RELEASE Right 06/18/2014   Procedure: RIGHT LONG FINGER TRIGGER RELEASE;  Surgeon: Cheral Almas, MD;  Location: [facility];  Service: Orthopedics;  Laterality: Right;   TUBAL LIGATION     VAGINAL HYSTERECTOMY Bilateral 11/04/2016   Procedure: HYSTERECTOMY VAGINAL with Bilateral Salpingectomy;  Surgeon: Hal Morales, MD;  Location: [facility] ORS;  Service: Gynecology;  Laterality: Bilateral;   WEIL OSTEOTOMY Right 07/07/2020   Procedure: RIGHT FOOT WEIL OSTEOTOMY 2, 3, AND 4 METATARSALS AND PROXIMAL INTERPHALANGEAL JOINT FUSION 2 & 4 TOES;  Surgeon: Nadara Mustard,  MD;  Location:  SURGERY CENTER;  Service: Orthopedics;  Laterality: Right;    I have reviewed Hannah social history and family history with Hannah [name] and they are unchanged from previous note.  ALLERGIES:  is allergic to elemental sulfur and sulfa antibiotics.  MEDICATIONS:  Current Outpatient Medications  Medication Sig Dispense Refill   aspirin EC 81 MG tablet Take 81 mg by mouth daily. Swallow whole.     calcium carbonate (OSCAL) 1500 (600 Ca) MG TABS tablet Take 600 mg of elemental calcium by mouth daily.     Cholecalciferol (VITAMIN D3) 1.25 MG (50000 UT) CAPS Take 1 capsule by mouth every Sunday.     docusate sodium (COLACE) 100 MG capsule Take 1 capsule (100 mg total) by mouth 2 (two) times daily. 10 capsule 0   ferrous sulfate 325 (65 FE) MG tablet Take 325 mg by mouth 2 (two) times daily with a meal.     lenalidomide (REVLIMID) 10 MG capsule Take 1 capsule (10 mg total) by mouth daily. [employer] Auth # [id number]     Date Obtained 02/23/2023  TAKE 1 CAPSULE BY MOUTH 1 TIME A DAY FOR 21 DAYS ON THEN 7 DAYS OFF 21 capsule 0   methocarbamol (ROBAXIN) 500 MG tablet Take 1 tablet (500 mg total) by mouth every  8 (eight) hours as needed for muscle spasms. 30 tablet 0   naproxen (NAPROSYN) 250 MG tablet Take 1 tablet (250 mg total) by mouth 2 (two) times daily with a meal. 30 tablet 0   oxyCODONE (OXY IR/ROXICODONE) 5 MG immediate release tablet Take 1 tablet (5 mg total) by mouth every 4 (four) hours as needed for moderate pain (pain score 4-6). 30 tablet 0   sertraline (ZOLOFT) 50 MG tablet Take 50 mg by mouth daily.     valACYclovir (VALTREX) 500 MG tablet Take 500 mg by mouth 2 (two) times daily.     No current facility-administered medications for this visit.    PHYSICAL EXAMINATION: ECOG PERFORMANCE STATUS: 1 - Symptomatic but completely ambulatory  Vitals:   03/06/23 0905  BP: (!) 142/85  Pulse: 81  Resp: 15  Temp: 98.7 F (37.1 C)  SpO2: 100%   Wt Readings from  Last 3 Encounters:  03/06/23 226 lb 4.8 oz (102.6 kg)  01/31/23 215 lb (97.5 kg)  01/18/23 217 lb 8 oz (98.7 kg)     GENERAL:alert, no distress and comfortable SKIN: skin color, texture, turgor are normal, no rashes or significant lesions EYES: normal, Conjunctiva are pink and non-injected, sclera clear NECK: supple, thyroid normal size, non-tender, without nodularity LYMPH:  no palpable lymphadenopathy in Hannah cervical, axillary  LUNGS: clear to auscultation and percussion with normal breathing effort HEART: regular rate & rhythm and no murmurs and no lower extremity edema ABDOMEN:abdomen soft, non-tender and normal bowel sounds Musculoskeletal:no cyanosis of digits and no clubbing  NEURO: alert & oriented x 3 with fluent speech, no focal motor/sensory deficits      LABORATORY DATA:  I have reviewed Hannah data as listed    Latest Ref Rng & Units 03/06/2023    8:41 AM 01/18/2023   10:00 AM 12/07/2022    8:24 AM  CBC  WBC 4.0 - 10.5 K/uL 5.4  6.1  5.2   Hemoglobin 12.0 - 15.0 g/dL 16.1  09.6  04.5   Hematocrit 36.0 - 46.0 % 31.6  37.2  35.1   Platelets 150 - 400 K/uL 163  155  182         Latest Ref Rng & Units 03/06/2023    8:41 AM 01/18/2023   10:00 AM 12/07/2022    8:24 AM  CMP  Glucose 70 - 99 mg/dL 91  96  409   BUN 6 - 20 mg/dL 13  10  18    Creatinine 0.44 - 1.00 mg/dL 8.11  9.14  7.82   Sodium 135 - 145 mmol/L 142  139  141   Potassium 3.5 - 5.1 mmol/L 3.9  3.7  4.0   Chloride 98 - 111 mmol/L 107  101  106   CO2 22 - 32 mmol/L 31  29  30    Calcium 8.9 - 10.3 mg/dL 8.8  8.9  9.3   Total Protein 6.5 - 8.1 g/dL 6.6   7.1   Total Bilirubin 0.3 - 1.2 mg/dL 0.4   0.5   Alkaline Phos 38 - 126 U/L 77   67   AST 15 - 41 U/L 14   14   ALT 0 - 44 U/L 10   10       RADIOGRAPHIC STUDIES: I have personally reviewed Hannah radiological images as listed and agreed with Hannah findings in Hannah report. No results found.    No orders of Hannah defined types were placed in this  encounter.  All questions  were answered. Hannah [name] knows to call Hannah clinic with any problems, questions or concerns. No barriers to learning was detected. Hannah total time spent in Hannah appointment was 20 minutes.     Malachy Mood, MD 03/06/2023

## 2023-03-06 NOTE — Patient Instructions (Signed)

## 2023-03-07 ENCOUNTER — Encounter: Payer: BC Managed Care – PPO | Admitting: Rehabilitative and Restorative Service Providers"

## 2023-03-07 ENCOUNTER — Encounter: Payer: Self-pay | Admitting: Physical Therapy

## 2023-03-07 ENCOUNTER — Ambulatory Visit (INDEPENDENT_AMBULATORY_CARE_PROVIDER_SITE_OTHER): Payer: BC Managed Care – PPO | Admitting: Physical Therapy

## 2023-03-07 DIAGNOSIS — R600 Localized edema: Secondary | ICD-10-CM

## 2023-03-07 DIAGNOSIS — M25511 Pain in right shoulder: Secondary | ICD-10-CM | POA: Diagnosis not present

## 2023-03-07 DIAGNOSIS — G8929 Other chronic pain: Secondary | ICD-10-CM

## 2023-03-07 DIAGNOSIS — M6281 Muscle weakness (generalized): Secondary | ICD-10-CM

## 2023-03-07 LAB — KAPPA/LAMBDA LIGHT CHAINS
Kappa free light chain: 30.6 mg/L — ABNORMAL HIGH (ref 3.3–19.4)
Kappa, lambda light chain ratio: 1.63 (ref 0.26–1.65)
Lambda free light chains: 18.8 mg/L (ref 5.7–26.3)

## 2023-03-07 NOTE — Telephone Encounter (Signed)
Absolutely, she can do water therapy

## 2023-03-07 NOTE — Therapy (Signed)
OUTPATIENT PHYSICAL THERAPY TREATMENT   Patient Name: KAITLYND YOH MRN: [id number] DOB:Jun 08, 1960, 62 y.o., female Today's Date: 03/07/2023  END OF SESSION:  PT End of Session - 03/07/23 1433     Visit Number 5    Number of Visits 20    Date for PT Re-Evaluation 04/27/23    Authorization Type [employer] 20% coinsurance    Progress Note Due on Visit 10    PT Start Time 1347    PT Stop Time 1427    PT Time Calculation (min) 40 min    Activity Tolerance Patient tolerated treatment well    Behavior During Therapy WFL for tasks assessed/performed                Past Medical History:  Diagnosis Date   Allergy    Anemia    Anxiety    Arthritis    Asthma    Cancer (HCC)    multiple myeloma   Depression    Hypertension    Past Surgical History:  Procedure Laterality Date   ABDOMINAL HYSTERECTOMY     BICEPT TENODESIS Right 01/31/2023   Procedure: BICEPS TENODESIS;  Surgeon: Cammy Copa, MD;  Location: [facility] OR;  Service: Orthopedics;  Laterality: Right;   CHOLECYSTECTOMY  1993   COLONOSCOPY     HERNIA REPAIR     REVERSE SHOULDER ARTHROPLASTY Right 01/31/2023   Procedure: REVERSE SHOULDER ARTHROPLASTY;  Surgeon: Cammy Copa, MD;  Location: [facility] OR;  Service: Orthopedics;  Laterality: Right;   TOTAL KNEE ARTHROPLASTY Right 01/28/2016   TOTAL KNEE ARTHROPLASTY Right 01/28/2016   Procedure: RIGHT TOTAL KNEE ARTHROPLASTY;  Surgeon: Tarry Kos, MD;  Location: [facility] OR;  Service: Orthopedics;  Laterality: Right;   TRIGGER FINGER RELEASE Right 06/18/2014   Procedure: RIGHT LONG FINGER TRIGGER RELEASE;  Surgeon: Cheral Almas, MD;  Location: [facility];  Service: Orthopedics;  Laterality: Right;   TUBAL LIGATION     VAGINAL HYSTERECTOMY Bilateral 11/04/2016   Procedure: HYSTERECTOMY VAGINAL with Bilateral Salpingectomy;  Surgeon: Hal Morales, MD;  Location: [facility];  Service: Gynecology;  Laterality: Bilateral;   WEIL OSTEOTOMY Right 07/07/2020    Procedure: RIGHT FOOT WEIL OSTEOTOMY 2, 3, AND 4 METATARSALS AND PROXIMAL INTERPHALANGEAL JOINT FUSION 2 & 4 TOES;  Surgeon: Nadara Mustard, MD;  Location: [facility];  Service: Orthopedics;  Laterality: Right;   Patient Active Problem List   Diagnosis Date Noted   Biceps tendonitis on right 02/11/2023   S/P reverse total shoulder arthroplasty, right 01/31/2023   Multiple myeloma in remission (HCC) 07/13/2020   Claw toe, right    Trigger finger, left middle finger 04/09/2020   Stenosing tenosynovitis of finger of left hand 02/27/2020   Hemoglobin C trait (HCC) 02/27/2018   Major depression, recurrent (HCC) 01/12/2018   Chronic right shoulder pain 12/20/2017   MDD (major depressive disorder), recurrent episode, moderate (HCC) 11/04/2017   Primary osteoarthritis of both hands 09/26/2017   Primary osteoarthritis of right shoulder 09/26/2017   Primary osteoarthritis of both hips 09/26/2017   Status post total knee replacement, right 09/26/2017   Primary osteoarthritis of both feet 09/26/2017   History of asthma 09/26/2017   Primary osteoarthritis of left knee 04/18/2017   Menorrhagia 11/04/2016   Fibroid tumor 11/04/2016   Adenomyosis 11/04/2016   Hypertension, essential 11/04/2016   Total knee replacement status 01/28/2016    PCP: Gaspar Garbe MD  REFERRING PROVIDER: Julieanne Cotton, PA-C  REFERRING DIAG: [phone] (  ICD-10-CM) - S/P reverse total shoulder arthroplasty, right  THERAPY DIAG:  Chronic right shoulder pain  Muscle weakness (generalized)  Localized edema  Rationale for Evaluation and Treatment: Rehabilitation  ONSET DATE: surgery 01/31/2023  SUBJECTIVE:                                                                                                                                                                                      SUBJECTIVE STATEMENT: Pt indicated 4/10 on shoulder and 10/10 back pain today. She will see MD on  03/14/24  PERTINENT HISTORY: PMH anxiety CA myeloma depression HTN Rt TKA   PAIN:  NPRS scale: 4/10.   Pain location: Rt shoulder, general shoulder  Pain description: achy Aggravating factors: arm movements, arm handing out of sling.  Relieving factors: pain med, OTC medicine  PRECAUTIONS: Shoulder - "Do not want to externally rotate past 30 degrees to protect subscapularis repair "  WEIGHT BEARING RESTRICTIONS: Yes Rt shoulder  FALLS:  Has patient fallen in last 6 months? No  LIVING ENVIRONMENT: Lives in: House/apartment   OCCUPATION: Work from home on computer.   PLOF: Independent, light gardening. Walking for exercise.   PATIENT GOALS: Reduce pain, get arm back to ROM/use.   OBJECTIVE:   PATIENT SURVEYS:  02/16/2023 FOTO intake:  29   predicted:  61  COGNITION: 02/16/2023 Overall cognitive status: WFL     SENSATION: 02/16/2023 WFL  POSTURE: 02/16/2023 No specific.   UPPER EXTREMITY ROM:   ROM Right 02/16/2023 AROM (PROM) In supine  Right 02/24/2023 AROM in supine Right 03/01/2023 AROM in supine   Shoulder flexion 50 (90) 100 112  Shoulder extension     Shoulder abduction (62)    Shoulder adduction     Shoulder internal rotation To belly    Shoulder external rotation NT    Elbow flexion     Elbow extension     Wrist flexion     Wrist extension     Wrist ulnar deviation     Wrist radial deviation     Wrist pronation     Wrist supination     (Blank rows = not tested)  UPPER EXTREMITY MMT:  MMT Right 02/16/2023 Left 02/16/2023  Shoulder flexion NT 5/5  Shoulder extension    Shoulder abduction NT 5/5  Shoulder adduction    Shoulder internal rotation NT 5/5  Shoulder external rotation NT 5/5  Middle trapezius    Lower trapezius    Elbow flexion  5/5  Elbow extension  5/5  Wrist flexion    Wrist extension    Wrist ulnar deviation    Wrist radial deviation    Wrist pronation  Wrist supination    Grip strength (lbs)    (Blank rows =  not tested)  SHOULDER SPECIAL TESTS: 02/16/2023 No tested today  JOINT MOBILITY TESTING:  02/16/2023 No testing today  PALPATION:  02/16/2023 General tenderness to light touch around shoulder joint.                                                                                                                                                                                                   TODAY'S TREATMENT:                                                                                         DATE: 03/07/2023 Therex: UBE Lvl  3,  3 mins fwd/back each way  Standing pball roll ups wall for shoulder flexion and lumbar extension 5 sec X 10 Seated lumbar flexion stretch with pball 5 sec X 10 Supine AAROM flexion, abd, ER, all 2 x 10 to tolerance  Pulley flexion, scaption 2 mins each way Tband rows green 2 x 15  Manual Rt shoulder passive range in flexion, scaption, abduction to tolerance.  DATE: 03/01/2023 Therex: UBE Lvl  1.5  3 mins fwd/back each way  Standing lumbar extension x 5 AROM (cues for home) Supine lumbar trunk rotation 15 sec x 3 bilateral (cues for home) Supine AROM flexion 2 x 10 to tolerance  Pulley flexion, scaption 3 mins each way Tband rows green 2 x 15  Manual Rt shoulder passive range in flexion, scaption, abduction to tolerance.  TODAY'S TREATMENT:                                                                                        DATE: 02/24/2023 Therex: UBE Lvl  1 3 mins fwd/back each way  Pulley flexion, scaption 3 mins each way Supine AAROM wand x 15 Isometric flexion, abduction Rt shoulder  5 sec hold  x 5 each with cues for home use.   Manual Rt shoulder passive range in flexion, scaption, abduction to tolerance.  TODAY'S TREATMENT:                                                                                        DATE: 02/21/2023 Therex: Pulleys 2.5 minutes flexion, 2 minutes scaption UBE L1 2 min fwd, 2 min retro Supine AAROM chest  press into shoulder flexion X 10 Supine AAROM shoulder abduction X 10 Supine scapular retractions 3 sec X10 Pendulums X 15, front to back, side to side, circles  Manual Rt shoulder passive range in flexion, scaption, abduction to tolerance.  TODAY'S TREATMENT:                                                                                        DATE: 02/16/2023 Therex:    HEP instruction/performance c cues for techniques, handout provided.  Trial set performed of each for comprehension and symptom assessment.  See below for exercise list  Manual Rt shoulder passive range in flexion, scaption, abduction to tolerance.    PATIENT EDUCATION: 02/24/2023 Education details: HEP update Person educated: Patient Education method: Programmer, multimedia, Demonstration, Verbal cues, and Handouts Education comprehension: verbalized understanding, returned demonstration, and verbal cues required  HOME EXERCISE PROGRAM: Access Code: [email or web address] URL: [email or web address] Date: 02/24/2023 Prepared by: Chyrel Masson  Exercises - Seated Scapular Retraction  - 3-5 x daily - 7 x weekly - 1 sets - 10 reps - 3-5 hold - Standing Horizontal Shoulder Pendulum Supported with Arm Bent  - 2-3 x daily - 7 x weekly - 1-2 sets - 10 reps - Standing Flexion Extension Shoulder Pendulum Supported with Arm Bent  - 2-3 x daily - 7 x weekly - 1-2 sets - 10 reps - Supine Shoulder Flexion Extension AAROM with Dowel  - 2-3 x daily - 7 x weekly - 1-2 sets - 10-15 reps - 3 hold - Standing Isometric Shoulder Flexion with Doorway - Arm Bent  - 1-2 x daily - 7 x weekly - 1 sets - 10 reps - 5-10 hold - Standing Isometric Shoulder Abduction with Doorway - Arm Bent (Mirrored)  - 1-2 x daily - 7 x weekly - 1 sets - 10 reps - 5-10 hold  ASSESSMENT:  CLINICAL IMPRESSION: Left shoulder is doing reasonably well, she is more limited by severe back pain lately. She will see MD coming up about this next week. She does want to try to  aquatic PT so I did set her up for an appointment Friday for this and I will send new PT cert off to MD to include aquatic PT intervention into her PT plan of care.  OBJECTIVE IMPAIRMENTS: decreased activity tolerance, decreased coordination, decreased endurance, decreased mobility, decreased ROM, decreased strength, hypomobility, increased edema, increased  fascial restrictions, impaired perceived functional ability, increased muscle spasms, impaired flexibility, impaired UE functional use, improper body mechanics, and pain.   ACTIVITY LIMITATIONS: carrying, lifting, bending, sleeping, bed mobility, bathing, toileting, dressing, self feeding, reach over head, and hygiene/grooming  PARTICIPATION LIMITATIONS: meal prep, cleaning, laundry, interpersonal relationship, driving, shopping, and community activity  PERSONAL FACTORS:  No specific factors  are also affecting patient's functional outcome.   REHAB POTENTIAL: Good  CLINICAL DECISION MAKING: Stable/uncomplicated  EVALUATION COMPLEXITY: Low   GOALS: Goals reviewed with patient? Yes  SHORT TERM GOALS: (target date for Short term goals are 3 weeks 03/09/2023)  1.Patient will demonstrate independent use of home exercise program to maintain progress from in clinic treatments. Goal status: on going 02/24/2023  LONG TERM GOALS: (target dates for all long term goals are 10 weeks  04/27/2023 )   1. Patient will demonstrate/report pain at worst less than or equal to 2/10 to facilitate minimal limitation in daily activity secondary to pain symptoms. Goal status: New   2. Patient will demonstrate independent use of home exercise program to facilitate ability to maintain/progress functional gains from skilled physical therapy services. Goal status: New   3. Patient will demonstrate FOTO outcome > or = 61 % to indicate reduced disability due to condition. Goal status: New   4.  Patient will demonstrate Rt UE MMT 4/5 throughout to facilitate  lifting, reaching, carrying at [facility] in daily activity.   Goal status: New   5.  Patient will demonstrate Rt GH joint AROM WFL s symptoms to facilitate usual overhead reaching, self care, dressing at PLOF.    Goal status: New   6.  Patient will demonstrate/report ability to perform work at [employer].  Goal status: New    PLAN:  PT FREQUENCY: 1-3x/week  PT DURATION: 10 weeks  PLANNED INTERVENTIONS: Aquatic PT Therapeutic exercises, Therapeutic activity, Neuro Muscular re-education, Balance training, Gait training, Patient/Family education, Joint mobilization, Stair training, DME instructions, Dry Needling, Electrical stimulation, Traction, Cryotherapy, vasopneumatic device Moist heat, Taping, Ultrasound, Ionotophoresis 4mg /ml Dexamethasone, and aquatic therapy ,Manual therapy.  All included unless contraindicated  PLAN FOR NEXT SESSION: MD progress note  No ER past 30 degrees, No IR activation/strengthening.   Ivery Quale, PT, DPT 03/07/23 2:33 PM

## 2023-03-08 ENCOUNTER — Other Ambulatory Visit: Payer: Self-pay

## 2023-03-08 LAB — MULTIPLE MYELOMA PANEL, SERUM
Albumin SerPl Elph-Mcnc: 3.4 g/dL (ref 2.9–4.4)
Albumin/Glob SerPl: 1.4 (ref 0.7–1.7)
Alpha 1: 0.2 g/dL (ref 0.0–0.4)
Alpha2 Glob SerPl Elph-Mcnc: 0.5 g/dL (ref 0.4–1.0)
B-Globulin SerPl Elph-Mcnc: 0.8 g/dL (ref 0.7–1.3)
Gamma Glob SerPl Elph-Mcnc: 1.1 g/dL (ref 0.4–1.8)
Globulin, Total: 2.5 g/dL (ref 2.2–3.9)
IgA: 177 mg/dL (ref 87–352)
IgG (Immunoglobin G), Serum: 1384 mg/dL (ref 586–1602)
IgM (Immunoglobulin M), Srm: 20 mg/dL — ABNORMAL LOW (ref 26–217)
Total Protein ELP: 5.9 g/dL — ABNORMAL LOW (ref 6.0–8.5)

## 2023-03-08 NOTE — Addendum Note (Signed)
Addended by: Barbette Or on: 03/08/2023 01:17 PM   Modules accepted: Orders

## 2023-03-08 NOTE — Telephone Encounter (Signed)
Multiple myeloma panel results still pending. Will send note to Dr [name] once complete since she would like for him to review as well.

## 2023-03-08 NOTE — Telephone Encounter (Signed)
FYI Order placed in chart. I called and talked to the pt. She stated her oncologist is doing labwork concerning possible myeloma. She wanted Dr. [name] to take a look at the results once in too

## 2023-03-09 ENCOUNTER — Other Ambulatory Visit: Payer: Self-pay | Admitting: Hematology

## 2023-03-09 ENCOUNTER — Other Ambulatory Visit: Payer: Self-pay

## 2023-03-09 ENCOUNTER — Encounter: Payer: BC Managed Care – PPO | Admitting: Rehabilitative and Restorative Service Providers"

## 2023-03-09 NOTE — Telephone Encounter (Signed)
Refill is not due to be refilled until 03/25/2023 per [employer] Application.  Pt is currently under [employer] Auth#[id number] effective 02/23/2023 thru 03/25/2023

## 2023-03-10 ENCOUNTER — Encounter: Payer: Self-pay | Admitting: Physical Therapy

## 2023-03-10 ENCOUNTER — Ambulatory Visit: Payer: BC Managed Care – PPO | Admitting: Physical Therapy

## 2023-03-10 DIAGNOSIS — R600 Localized edema: Secondary | ICD-10-CM

## 2023-03-10 DIAGNOSIS — M6281 Muscle weakness (generalized): Secondary | ICD-10-CM

## 2023-03-10 DIAGNOSIS — G8929 Other chronic pain: Secondary | ICD-10-CM | POA: Diagnosis not present

## 2023-03-10 DIAGNOSIS — M25511 Pain in right shoulder: Secondary | ICD-10-CM | POA: Diagnosis not present

## 2023-03-10 NOTE — Therapy (Signed)
OUTPATIENT PHYSICAL THERAPY TREATMENT   Patient Name: CURSTIN NORTHROP MRN: [id number] DOB:[birth year], 62 y.o., female Today's Date: 03/10/2023  END OF SESSION:  PT End of Session - 03/10/23 0820     Visit Number 6    Number of Visits 20    Date for PT Re-Evaluation 04/27/23    Authorization Type [employer] 20% coinsurance    Progress Note Due on Visit 10    PT Start Time 0802    PT Stop Time 0842    PT Time Calculation (min) 40 min    Activity Tolerance Patient tolerated treatment well    Behavior During Therapy [facility] for tasks assessed/performed                Past Medical History:  Diagnosis Date   Allergy    Anemia    Anxiety    Arthritis    Asthma    Cancer (HCC)    multiple myeloma   Depression    Hypertension    Past Surgical History:  Procedure Laterality Date   ABDOMINAL HYSTERECTOMY     BICEPT TENODESIS Right 01/31/2023   Procedure: BICEPS TENODESIS;  Surgeon: Cammy Copa, MD;  Location: [facility] OR;  Service: Orthopedics;  Laterality: Right;   CHOLECYSTECTOMY  1993   COLONOSCOPY     HERNIA REPAIR     REVERSE SHOULDER ARTHROPLASTY Right 01/31/2023   Procedure: REVERSE SHOULDER ARTHROPLASTY;  Surgeon: Cammy Copa, MD;  Location: [facility] OR;  Service: Orthopedics;  Laterality: Right;   TOTAL KNEE ARTHROPLASTY Right 01/28/2016   TOTAL KNEE ARTHROPLASTY Right 01/28/2016   Procedure: RIGHT TOTAL KNEE ARTHROPLASTY;  Surgeon: Tarry Kos, MD;  Location: [facility] OR;  Service: Orthopedics;  Laterality: Right;   TRIGGER FINGER RELEASE Right 06/18/2014   Procedure: RIGHT LONG FINGER TRIGGER RELEASE;  Surgeon: Cheral Almas, MD;  Location: [facility];  Service: Orthopedics;  Laterality: Right;   TUBAL LIGATION     VAGINAL HYSTERECTOMY Bilateral 11/04/2016   Procedure: HYSTERECTOMY VAGINAL with Bilateral Salpingectomy;  Surgeon: Hal Morales, MD;  Location: [facility];  Service: Gynecology;  Laterality: Bilateral;   WEIL OSTEOTOMY Right 07/07/2020    Procedure: RIGHT FOOT WEIL OSTEOTOMY 2, 3, AND 4 METATARSALS AND PROXIMAL INTERPHALANGEAL JOINT FUSION 2 & 4 TOES;  Surgeon: Nadara Mustard, MD;  Location: [facility];  Service: Orthopedics;  Laterality: Right;   Patient Active Problem List   Diagnosis Date Noted   Biceps tendonitis on right 02/11/2023   S/P reverse total shoulder arthroplasty, right 01/31/2023   Multiple myeloma in remission (HCC) 07/13/2020   Claw toe, right    Trigger finger, left middle finger 04/09/2020   Stenosing tenosynovitis of finger of left hand 02/27/2020   Hemoglobin C trait (HCC) 02/27/2018   Major depression, recurrent (HCC) 01/12/2018   Chronic right shoulder pain 12/20/2017   MDD (major depressive disorder), recurrent episode, moderate (HCC) 11/04/2017   Primary osteoarthritis of both hands 09/26/2017   Primary osteoarthritis of right shoulder 09/26/2017   Primary osteoarthritis of both hips 09/26/2017   Status post total knee replacement, right 09/26/2017   Primary osteoarthritis of both feet 09/26/2017   History of asthma 09/26/2017   Primary osteoarthritis of left knee 04/18/2017   Menorrhagia 11/04/2016   Fibroid tumor 11/04/2016   Adenomyosis 11/04/2016   Hypertension, essential 11/04/2016   Total knee replacement status 01/28/2016    PCP: Gaspar Garbe MD  REFERRING PROVIDER: Julieanne Cotton, PA-C  REFERRING DIAG: [phone] (  ICD-10-CM) - S/P reverse total shoulder arthroplasty, right  THERAPY DIAG:  Chronic right shoulder pain  Muscle weakness (generalized)  Localized edema  Rationale for Evaluation and Treatment: Rehabilitation  ONSET DATE: surgery 01/31/2023  SUBJECTIVE:                                                                                                                                                                                      SUBJECTIVE STATEMENT: She relays some soreness overall, has been managing pain with NSAIDs  PERTINENT  HISTORY: PMH anxiety CA myeloma depression HTN Rt TKA   PAIN:  NPRS scale: 4/10 overall Pain location: Rt shoulder, general shoulder  Pain description: achy Aggravating factors: arm movements, arm handing out of sling.  Relieving factors: pain med, OTC medicine  PRECAUTIONS: Shoulder - "Do not want to externally rotate past 30 degrees to protect subscapularis repair "  WEIGHT BEARING RESTRICTIONS: Yes Rt shoulder  FALLS:  Has patient fallen in last 6 months? No  LIVING ENVIRONMENT: Lives in: House/apartment   OCCUPATION: Work from home on computer.   PLOF: Independent, light gardening. Walking for exercise.   PATIENT GOALS: Reduce pain, get arm back to ROM/use.   OBJECTIVE:   PATIENT SURVEYS:  02/16/2023 FOTO intake:  29   predicted:  61  COGNITION: 02/16/2023 Overall cognitive status: WFL     SENSATION: 02/16/2023 WFL  POSTURE: 02/16/2023 No specific.   UPPER EXTREMITY ROM:   ROM Right 02/16/2023 AROM (PROM) In supine  Right 02/24/2023 AROM in supine Right 03/01/2023 AROM in supine   Shoulder flexion 50 (90) 100 112  Shoulder extension     Shoulder abduction (62)    Shoulder adduction     Shoulder internal rotation To belly    Shoulder external rotation NT    Elbow flexion     Elbow extension     Wrist flexion     Wrist extension     Wrist ulnar deviation     Wrist radial deviation     Wrist pronation     Wrist supination     (Blank rows = not tested)  UPPER EXTREMITY MMT:  MMT Right 02/16/2023 Left 02/16/2023  Shoulder flexion NT 5/5  Shoulder extension    Shoulder abduction NT 5/5  Shoulder adduction    Shoulder internal rotation NT 5/5  Shoulder external rotation NT 5/5  Middle trapezius    Lower trapezius    Elbow flexion  5/5  Elbow extension  5/5  Wrist flexion    Wrist extension    Wrist ulnar deviation    Wrist radial deviation    Wrist pronation    Wrist supination  Grip strength (lbs)    (Blank rows = not  tested)  SHOULDER SPECIAL TESTS: 02/16/2023 No tested today  JOINT MOBILITY TESTING:  02/16/2023 No testing today  PALPATION:  02/16/2023 General tenderness to light touch around shoulder joint.                                                                                                                                                                                                   TODAY'S TREATMENT:                                                                                         03/10/23 Pt seen for aquatic therapy today.  Treatment took place in water 3.5-4.75 ft in depth at the [facility] pool. Temp of water was 91.  Pt entered/exited the pool via stairs  Pt requires the buoyancy and hydrostatic pressure of water for support, and to offload joints by unweighting joint load by at least 50 % in navel deep water and by at least 75-80% in chest to neck deep water.  Viscosity of the water is needed for resistance of strengthening. Water current perturbations provides challenge to standing balance requiring increased core activation.  exercises Walking forward 3 round trips holding water dumbells at 90 deg Walking backward 3 round trips holding water dumbells at 90 deg Walking sideways 3 round trips holding water dumbells at 90 deg Standing L stretch for shoulder flexion and lumbar flexion 10 sec X10 Shoulder adduction with mini squat using dumbells 2X15 Shoulder horizontal abd/add with dumbells 2X15 Shoulder flexion alternating opposite contralateral extension with dumbells 2X15 Shoulder cirlces with dumbell while holding squat 2X15 Push/Pull using kickboard while holding lunge 2X15 Shoulder extension with kickboard 2X15 Hip abd/add swings X 15 bilat Hip flexion/extension swings X 15 bilat Standing L stretch again for shoulder/lumbar flexion 10 sec X 10 holding onto pool rail for increased stretch due to incline of rail   DATE: 03/07/2023 Therex: UBE Lvl  3,   3 mins fwd/back each way  Standing pball roll ups wall for shoulder flexion and lumbar extension 5 sec X 10 Seated lumbar flexion stretch with pball 5 sec X 10 Supine AAROM flexion, abd, ER, all 2 x 10 to tolerance  Pulley flexion, scaption 2 mins each way Tband rows green 2 x 15  Manual Rt shoulder passive range in flexion, scaption, abduction to tolerance.  DATE: 03/01/2023 Therex: UBE Lvl  1.5  3 mins fwd/back each way  Standing lumbar extension x 5 AROM (cues for home) Supine lumbar trunk rotation 15 sec x 3 bilateral (cues for home) Supine AROM flexion 2 x 10 to tolerance  Pulley flexion, scaption 3 mins each way Tband rows green 2 x 15  Manual Rt shoulder passive range in flexion, scaption, abduction to tolerance.  TODAY'S TREATMENT:                                                                                        DATE: 02/24/2023 Therex: UBE Lvl  1 3 mins fwd/back each way  Pulley flexion, scaption 3 mins each way Supine AAROM wand x 15 Isometric flexion, abduction Rt shoulder  5 sec hold x 5 each with cues for home use.   Manual Rt shoulder passive range in flexion, scaption, abduction to tolerance.  TODAY'S TREATMENT:                                                                                        DATE: 02/21/2023 Therex: Pulleys 2.5 minutes flexion, 2 minutes scaption UBE L1 2 min fwd, 2 min retro Supine AAROM chest press into shoulder flexion X 10 Supine AAROM shoulder abduction X 10 Supine scapular retractions 3 sec X10 Pendulums X 15, front to back, side to side, circles  Manual Rt shoulder passive range in flexion, scaption, abduction to tolerance.  TODAY'S TREATMENT:                                                                                        DATE: 02/16/2023 Therex:    HEP instruction/performance c cues for techniques, handout provided.  Trial set performed of each for comprehension and symptom assessment.  See below for exercise  list  Manual Rt shoulder passive range in flexion, scaption, abduction to tolerance.    PATIENT EDUCATION: 02/24/2023 Education details: HEP update Person educated: Patient Education method: Programmer, multimedia, Demonstration, Verbal cues, and Handouts Education comprehension: verbalized understanding, returned demonstration, and verbal cues required  HOME EXERCISE PROGRAM: Access Code: [email or web address] URL: [email or web address] Date: 02/24/2023 Prepared by: Chyrel Masson  Exercises - Seated Scapular Retraction  - 3-5 x daily - 7 x weekly - 1 sets - 10 reps - 3-5 hold - Standing Horizontal Shoulder Pendulum  Supported with Arm Bent  - 2-3 x daily - 7 x weekly - 1-2 sets - 10 reps - Standing Flexion Extension Shoulder Pendulum Supported with Arm Bent  - 2-3 x daily - 7 x weekly - 1-2 sets - 10 reps - Supine Shoulder Flexion Extension AAROM with Dowel  - 2-3 x daily - 7 x weekly - 1-2 sets - 10-15 reps - 3 hold - Standing Isometric Shoulder Flexion with Doorway - Arm Bent  - 1-2 x daily - 7 x weekly - 1 sets - 10 reps - 5-10 hold - Standing Isometric Shoulder Abduction with Doorway - Arm Bent (Mirrored)  - 1-2 x daily - 7 x weekly - 1 sets - 10 reps - 5-10 hold  ASSESSMENT:  CLINICAL IMPRESSION:She had her first aquatic PT session today with good overall tolerance to activity. We will assess her soreness to this and adjust as necessary. Her Right shoulder is doing reasonably well overall post op.   OBJECTIVE IMPAIRMENTS: decreased activity tolerance, decreased coordination, decreased endurance, decreased mobility, decreased ROM, decreased strength, hypomobility, increased edema, increased fascial restrictions, impaired perceived functional ability, increased muscle spasms, impaired flexibility, impaired UE functional use, improper body mechanics, and pain.   ACTIVITY LIMITATIONS: carrying, lifting, bending, sleeping, bed mobility, bathing, toileting, dressing, self feeding, reach over  head, and hygiene/grooming  PARTICIPATION LIMITATIONS: meal prep, cleaning, laundry, interpersonal relationship, driving, shopping, and community activity  PERSONAL FACTORS:  No specific factors  are also affecting patient's functional outcome.   REHAB POTENTIAL: Good  CLINICAL DECISION MAKING: Stable/uncomplicated  EVALUATION COMPLEXITY: Low   GOALS: Goals reviewed with patient? Yes  SHORT TERM GOALS: (target date for Short term goals are 3 weeks 03/09/2023)  1.Patient will demonstrate independent use of home exercise program to maintain progress from in clinic treatments. Goal status: on going 02/24/2023  LONG TERM GOALS: (target dates for all long term goals are 10 weeks  04/27/2023 )   1. Patient will demonstrate/report pain at worst less than or equal to 2/10 to facilitate minimal limitation in daily activity secondary to pain symptoms. Goal status: New   2. Patient will demonstrate independent use of home exercise program to facilitate ability to maintain/progress functional gains from skilled physical therapy services. Goal status: New   3. Patient will demonstrate FOTO outcome > or = 61 % to indicate reduced disability due to condition. Goal status: New   4.  Patient will demonstrate Rt UE MMT 4/5 throughout to facilitate lifting, reaching, carrying at [facility] in daily activity.   Goal status: New   5.  Patient will demonstrate Rt GH joint AROM WFL s symptoms to facilitate usual overhead reaching, self care, dressing at PLOF.    Goal status: New   6.  Patient will demonstrate/report ability to perform work at [employer].  Goal status: New    PLAN:  PT FREQUENCY: 1-3x/week  PT DURATION: 10 weeks  PLANNED INTERVENTIONS: Aquatic PT Therapeutic exercises, Therapeutic activity, Neuro Muscular re-education, Balance training, Gait training, Patient/Family education, Joint mobilization, Stair training, DME instructions, Dry Needling, Electrical stimulation, Traction,  Cryotherapy, vasopneumatic device Moist heat, Taping, Ultrasound, Ionotophoresis 4mg /ml Dexamethasone, and aquatic therapy ,Manual therapy.  All included unless contraindicated  PLAN FOR NEXT SESSION: Rt shoulder progressions to tolerance No ER past 30 degrees, No IR activation/strengthening.   Ivery Quale, PT, DPT 03/10/23 8:21 AM

## 2023-03-11 NOTE — Telephone Encounter (Signed)
I reviewed her labs.  Nothing really looks that much out of range compared to the past several months.  Overall looks encouraging but the serum electrophoresis labs are not ones that I look at very frequently

## 2023-03-13 NOTE — Telephone Encounter (Signed)
Tried calling patient to advise

## 2023-03-14 ENCOUNTER — Other Ambulatory Visit: Payer: Self-pay

## 2023-03-15 ENCOUNTER — Other Ambulatory Visit: Payer: Self-pay

## 2023-03-15 ENCOUNTER — Encounter: Payer: Self-pay | Admitting: Hematology

## 2023-03-15 ENCOUNTER — Encounter: Payer: Self-pay | Admitting: Rehabilitative and Restorative Service Providers"

## 2023-03-15 ENCOUNTER — Ambulatory Visit: Payer: BC Managed Care – PPO | Admitting: Orthopedic Surgery

## 2023-03-15 ENCOUNTER — Encounter: Payer: Self-pay | Admitting: Orthopedic Surgery

## 2023-03-15 ENCOUNTER — Ambulatory Visit (INDEPENDENT_AMBULATORY_CARE_PROVIDER_SITE_OTHER): Payer: BC Managed Care – PPO | Admitting: Rehabilitative and Restorative Service Providers"

## 2023-03-15 DIAGNOSIS — R600 Localized edema: Secondary | ICD-10-CM | POA: Diagnosis not present

## 2023-03-15 DIAGNOSIS — M25511 Pain in right shoulder: Secondary | ICD-10-CM | POA: Diagnosis not present

## 2023-03-15 DIAGNOSIS — M6281 Muscle weakness (generalized): Secondary | ICD-10-CM

## 2023-03-15 DIAGNOSIS — G8929 Other chronic pain: Secondary | ICD-10-CM

## 2023-03-15 DIAGNOSIS — Z8579 Personal history of other malignant neoplasms of lymphoid, hematopoietic and related tissues: Secondary | ICD-10-CM | POA: Diagnosis not present

## 2023-03-15 NOTE — Progress Notes (Signed)
Post-Op Visit Note   Patient: Hannah [name]           Date of Birth: 21-Feb-1961           MRN: [id number] Visit Date: 03/15/2023 PCP: Gaspar Garbe, MD   Assessment & Plan:  Chief Complaint:  Chief Complaint  Patient presents with   Right Shoulder - Follow-up   Visit Diagnoses:  1. History of multiple myeloma     Plan: Trans a 62 year old patient who is now about 6 weeks out right reverse shoulder replacement.  Patient is doing well from a pain standpoint.  On examination she has good subscap strength external rotation is about 20 degrees isolated glenohumeral abduction is 80 and forward flexion is around 120.  Deltoid fires.  She is also having little bit of low back pain and left hip pain but no groin pain.  Has known history of multiple myeloma in the lumbar spine and pelvis but that was based on MRI scan from a year and a half ago.  Recent laboratory values on my review did not look significantly elevated compared to the past several values.  I think would be good to get MRI of the lumbar spine and pelvis to evaluate possible progression of the bony multiple myeloma.  6-week return on the shoulder.  I can call her with the results on the scan when they come in.  She also reached out to her oncologist about the significance of her laboratory values.  Follow-Up Instructions: No follow-ups on file.   Orders:  Orders Placed This Encounter  Procedures   MR Lumbar Spine w/o contrast   MR Pelvis w/o contrast   No orders of the defined types were placed in this encounter.   Imaging: No results found.  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   Multiple myeloma in remission (HCC) 07/13/2020   Claw toe, right    Trigger finger, left middle finger 04/09/2020   Stenosing tenosynovitis of finger of left hand 02/27/2020   Hemoglobin C trait (HCC)  02/27/2018   Major depression, recurrent (HCC) 01/12/2018   Chronic right shoulder pain 12/20/2017   MDD (major depressive disorder), recurrent episode, moderate (HCC) 11/04/2017   Primary osteoarthritis of both hands 09/26/2017   Primary osteoarthritis of right shoulder 09/26/2017   Primary osteoarthritis of both hips 09/26/2017   Status post total knee replacement, right 09/26/2017   Primary osteoarthritis of both feet 09/26/2017   History of asthma 09/26/2017   Primary osteoarthritis of left knee 04/18/2017   Menorrhagia 11/04/2016   Fibroid tumor 11/04/2016   Adenomyosis 11/04/2016   Hypertension, essential 11/04/2016   Total knee replacement status 01/28/2016   Past Medical History:  Diagnosis Date   Allergy    Anemia    Anxiety    Arthritis    Asthma    Cancer (HCC)    multiple myeloma   Depression    Hypertension     Family History  Problem Relation Age of Onset   Cancer Mother        uterine    Asthma Mother    Arthritis Daughter    Depression Maternal Uncle    Colon cancer Neg Hx     Past Surgical History:  Procedure Laterality Date   ABDOMINAL HYSTERECTOMY     BICEPT TENODESIS Right 01/31/2023   Procedure: BICEPS TENODESIS;  Surgeon: [name],  Corrie Mckusick, MD;  Location: [facility] OR;  Service: Orthopedics;  Laterality: Right;   CHOLECYSTECTOMY  1993   COLONOSCOPY     HERNIA REPAIR     REVERSE SHOULDER ARTHROPLASTY Right 01/31/2023   Procedure: REVERSE SHOULDER ARTHROPLASTY;  Surgeon: Cammy Copa, MD;  Location: [facility] OR;  Service: Orthopedics;  Laterality: Right;   TOTAL KNEE ARTHROPLASTY Right 01/28/2016   TOTAL KNEE ARTHROPLASTY Right 01/28/2016   Procedure: RIGHT TOTAL KNEE ARTHROPLASTY;  Surgeon: Tarry Kos, MD;  Location: [facility] OR;  Service: Orthopedics;  Laterality: Right;   TRIGGER FINGER RELEASE Right 06/18/2014   Procedure: RIGHT LONG FINGER TRIGGER RELEASE;  Surgeon: Cheral Almas, MD;  Location: [facility];  Service: Orthopedics;   Laterality: Right;   TUBAL LIGATION     VAGINAL HYSTERECTOMY Bilateral 11/04/2016   Procedure: HYSTERECTOMY VAGINAL with Bilateral Salpingectomy;  Surgeon: Hal Morales, MD;  Location: [facility];  Service: Gynecology;  Laterality: Bilateral;   WEIL OSTEOTOMY Right 07/07/2020   Procedure: RIGHT FOOT WEIL OSTEOTOMY 2, 3, AND 4 METATARSALS AND PROXIMAL INTERPHALANGEAL JOINT FUSION 2 & 4 TOES;  Surgeon: Nadara Mustard, MD;  Location: [facility];  Service: Orthopedics;  Laterality: Right;   Social History   Occupational History   Not on file  Tobacco Use   Smoking status: Never   Smokeless tobacco: Never  Vaping Use   Vaping status: Never Used  Substance and Sexual Activity   Alcohol use: No    Alcohol/week: 0.0 standard drinks of alcohol   Drug use: No   Sexual activity: Yes    Partners: Male    Birth control/protection: Surgical

## 2023-03-15 NOTE — Therapy (Signed)
OUTPATIENT PHYSICAL THERAPY TREATMENT   Patient Name: Hannah [name] MRN: [id number] DOB:[birth year], 62 y.o., female Today's Date: 03/15/2023  END OF SESSION:  PT End of Session - 03/15/23 0856     Visit Number 7    Number of Visits 20    Date for PT Re-Evaluation 04/27/23    Authorization Type [employer] 20% coinsurance    Progress Note Due on Visit 10    PT Start Time 0845    PT Stop Time 0925    PT Time Calculation (min) 40 min    Activity Tolerance Patient tolerated treatment well    Behavior During Therapy [facility] for tasks assessed/performed                 Past Medical History:  Diagnosis Date   Allergy    Anemia    Anxiety    Arthritis    Asthma    Cancer (HCC)    multiple myeloma   Depression    Hypertension    Past Surgical History:  Procedure Laterality Date   ABDOMINAL HYSTERECTOMY     BICEPT TENODESIS Right 01/31/2023   Procedure: BICEPS TENODESIS;  Surgeon: Cammy Copa, MD;  Location: [facility] OR;  Service: Orthopedics;  Laterality: Right;   CHOLECYSTECTOMY  1993   COLONOSCOPY     HERNIA REPAIR     REVERSE SHOULDER ARTHROPLASTY Right 01/31/2023   Procedure: REVERSE SHOULDER ARTHROPLASTY;  Surgeon: Cammy Copa, MD;  Location: [facility] OR;  Service: Orthopedics;  Laterality: Right;   TOTAL KNEE ARTHROPLASTY Right 01/28/2016   TOTAL KNEE ARTHROPLASTY Right 01/28/2016   Procedure: RIGHT TOTAL KNEE ARTHROPLASTY;  Surgeon: Tarry Kos, MD;  Location: [facility] OR;  Service: Orthopedics;  Laterality: Right;   TRIGGER FINGER RELEASE Right 06/18/2014   Procedure: RIGHT LONG FINGER TRIGGER RELEASE;  Surgeon: Cheral Almas, MD;  Location: [facility];  Service: Orthopedics;  Laterality: Right;   TUBAL LIGATION     VAGINAL HYSTERECTOMY Bilateral 11/04/2016   Procedure: HYSTERECTOMY VAGINAL with Bilateral Salpingectomy;  Surgeon: Hal Morales, MD;  Location: [facility] ORS;  Service: Gynecology;  Laterality: Bilateral;   WEIL OSTEOTOMY Right 07/07/2020    Procedure: RIGHT FOOT WEIL OSTEOTOMY 2, 3, AND 4 METATARSALS AND PROXIMAL INTERPHALANGEAL JOINT FUSION 2 & 4 TOES;  Surgeon: Nadara Mustard, MD;  Location: [facility];  Service: Orthopedics;  Laterality: Right;   Patient Active Problem List   Diagnosis Date Noted   Biceps tendonitis on right 02/11/2023   S/P reverse total shoulder arthroplasty, right 01/31/2023   Multiple myeloma in remission (HCC) 07/13/2020   Claw toe, right    Trigger finger, left middle finger 04/09/2020   Stenosing tenosynovitis of finger of left hand 02/27/2020   Hemoglobin C trait (HCC) 02/27/2018   Major depression, recurrent (HCC) 01/12/2018   Chronic right shoulder pain 12/20/2017   MDD (major depressive disorder), recurrent episode, moderate (HCC) 11/04/2017   Primary osteoarthritis of both hands 09/26/2017   Primary osteoarthritis of right shoulder 09/26/2017   Primary osteoarthritis of both hips 09/26/2017   Status post total knee replacement, right 09/26/2017   Primary osteoarthritis of both feet 09/26/2017   History of asthma 09/26/2017   Primary osteoarthritis of left knee 04/18/2017   Menorrhagia 11/04/2016   Fibroid tumor 11/04/2016   Adenomyosis 11/04/2016   Hypertension, essential 11/04/2016   Total knee replacement status 01/28/2016    PCP: Gaspar Garbe MD  REFERRING PROVIDER: Julieanne Cotton, PA-C  REFERRING DIAG:  Z96.611 (ICD-10-CM) - S/P reverse total shoulder arthroplasty, right  THERAPY DIAG:  Chronic right shoulder pain  Muscle weakness (generalized)  Localized edema  Rationale for Evaluation and Treatment: Rehabilitation  ONSET DATE: surgery 01/31/2023  SUBJECTIVE:                                                                                                                                                                                      SUBJECTIVE STATEMENT: Pt indicated feeling pretty good.  Reported getting a pain every now and then at  incision site.  Pt indicated seeing MD this morning with good report on motion.   Pt indicated back was doing better than it was.   PERTINENT HISTORY: PMH anxiety CA myeloma depression HTN Rt TKA   PAIN:  NPRS scale: 4/10 overall Pain location: Rt shoulder, general shoulder  Pain description: achy Aggravating factors: arm movements, arm handing out of sling.  Relieving factors: pain med, OTC medicine  PRECAUTIONS: Shoulder - "Do not want to externally rotate past 30 degrees to protect subscapularis repair "  WEIGHT BEARING RESTRICTIONS: Yes Rt shoulder  FALLS:  Has patient fallen in last 6 months? No  LIVING ENVIRONMENT: Lives in: House/apartment   OCCUPATION: Work from home on computer.   PLOF: Independent, light gardening. Walking for exercise.   PATIENT GOALS: Reduce pain, get arm back to ROM/use.   OBJECTIVE:   PATIENT SURVEYS:  03/15/2023 FOTO update:  54  02/16/2023 FOTO intake:  29   predicted:  61  COGNITION: 02/16/2023 Overall cognitive status: WFL     SENSATION: 02/16/2023 WFL  POSTURE: 02/16/2023 No specific.   UPPER EXTREMITY ROM:   ROM Right 02/16/2023 AROM (PROM) In supine  Right 02/24/2023 AROM in supine Right 03/01/2023 AROM in supine   Shoulder flexion 50 (90) 100 112  Shoulder extension     Shoulder abduction (62)    Shoulder adduction     Shoulder internal rotation To belly    Shoulder external rotation NT    Elbow flexion     Elbow extension     Wrist flexion     Wrist extension     Wrist ulnar deviation     Wrist radial deviation     Wrist pronation     Wrist supination     (Blank rows = not tested)  UPPER EXTREMITY MMT:  MMT Right 02/16/2023 Left 02/16/2023  Shoulder flexion NT 5/5  Shoulder extension    Shoulder abduction NT 5/5  Shoulder adduction    Shoulder internal rotation NT 5/5  Shoulder external rotation NT 5/5  Middle trapezius    Lower trapezius    Elbow flexion  5/5  Elbow extension  5/5  Wrist flexion     Wrist extension    Wrist ulnar deviation    Wrist radial deviation    Wrist pronation    Wrist supination    Grip strength (lbs)    (Blank rows = not tested)  SHOULDER SPECIAL TESTS: 02/16/2023 No tested today  JOINT MOBILITY TESTING:  02/16/2023 No testing today  PALPATION:  02/16/2023 General tenderness to light touch around shoulder joint.                                                                                                                                                                                                  TODAY'S TREATMENT:                                                                                        DATE:  03/15/2023 Therex: UBE Lvl 2.5 ,  3 mins fwd/back each way with 30 sec rest breaks.  Pulleys flexion , scaption with outstretched arm and eccentric lowering focus  Machine rows 2 x 15 10 lbs  Red band ER walk out holds isometric reactive 5 sec hold x 15 with red band Seated wand AAROM flexion against gravity x 10  Additional time for slow movement control focus and rest after activity to recover.     TODAY'S TREATMENT:                                                                                        03/10/23 Pt seen for aquatic therapy today.  Treatment took place in water 3.5-4.75 ft in depth at the [facility] pool. Temp of water was 91.  Pt entered/exited the pool via stairs  Pt requires the buoyancy and hydrostatic pressure of water for support, and to offload joints by unweighting joint load by at least 50 % in navel deep water and by at least 75-80% in  chest to neck deep water.  Viscosity of the water is needed for resistance of strengthening. Water current perturbations provides challenge to standing balance requiring increased core activation.  exercises Walking forward 3 round trips holding water dumbells at 90 deg Walking backward 3 round trips holding water dumbells at 90 deg Walking sideways 3 round trips  holding water dumbells at 90 deg Standing L stretch for shoulder flexion and lumbar flexion 10 sec X10 Shoulder adduction with mini squat using dumbells 2X15 Shoulder horizontal abd/add with dumbells 2X15 Shoulder flexion alternating opposite contralateral extension with dumbells 2X15 Shoulder cirlces with dumbell while holding squat 2X15 Push/Pull using kickboard while holding lunge 2X15 Shoulder extension with kickboard 2X15 Hip abd/add swings X 15 bilat Hip flexion/extension swings X 15 bilat Standing L stretch again for shoulder/lumbar flexion 10 sec X 10 holding onto pool rail for increased stretch due to incline of rail   TODAY'S TREATMENT:                                                                                        DATE:  03/07/2023 Therex: UBE Lvl  3,  3 mins fwd/back each way  Standing pball roll ups wall for shoulder flexion and lumbar extension 5 sec X 10 Seated lumbar flexion stretch with pball 5 sec X 10 Supine AAROM flexion, abd, ER, all 2 x 10 to tolerance  Pulley flexion, scaption 2 mins each way Tband rows green 2 x 15  Manual Rt shoulder passive range in flexion, scaption, abduction to tolerance.  TODAY'S TREATMENT:                                                                                        DATE: 03/01/2023 Therex: UBE Lvl  1.5  3 mins fwd/back each way  Standing lumbar extension x 5 AROM (cues for home) Supine lumbar trunk rotation 15 sec x 3 bilateral (cues for home) Supine AROM flexion 2 x 10 to tolerance  Pulley flexion, scaption 3 mins each way Tband rows green 2 x 15  Manual Rt shoulder passive range in flexion, scaption, abduction to tolerance.   PATIENT EDUCATION: 02/24/2023 Education details: HEP update Person educated: Patient Education method: Programmer, multimedia, Demonstration, Verbal cues, and Handouts Education comprehension: verbalized understanding, returned demonstration, and verbal cues required  HOME EXERCISE PROGRAM: Access  Code: [email or web address] URL: [email or web address] Date: 02/24/2023 Prepared by: Chyrel Masson  Exercises - Seated Scapular Retraction  - 3-5 x daily - 7 x weekly - 1 sets - 10 reps - 3-5 hold - Standing Horizontal Shoulder Pendulum Supported with Arm Bent  - 2-3 x daily - 7 x weekly - 1-2 sets - 10 reps - Standing Flexion Extension Shoulder Pendulum Supported with Arm Bent  - 2-3 x daily - 7 x weekly - 1-2 sets - 10 reps -  Supine Shoulder Flexion Extension AAROM with Dowel  - 2-3 x daily - 7 x weekly - 1-2 sets - 10-15 reps - 3 hold - Standing Isometric Shoulder Flexion with Doorway - Arm Bent  - 1-2 x daily - 7 x weekly - 1 sets - 10 reps - 5-10 hold - Standing Isometric Shoulder Abduction with Doorway - Arm Bent (Mirrored)  - 1-2 x daily - 7 x weekly - 1 sets - 10 reps - 5-10 hold  ASSESSMENT:  CLINICAL IMPRESSION: FOTO update showed improvement.  Pt to benefit from skilled PT services to progress mobility and strength c functional reach improvement.  Overall Pt making good progress at this time.  Fatigue was noted today in strengthening.   OBJECTIVE IMPAIRMENTS: decreased activity tolerance, decreased coordination, decreased endurance, decreased mobility, decreased ROM, decreased strength, hypomobility, increased edema, increased fascial restrictions, impaired perceived functional ability, increased muscle spasms, impaired flexibility, impaired UE functional use, improper body mechanics, and pain.   ACTIVITY LIMITATIONS: carrying, lifting, bending, sleeping, bed mobility, bathing, toileting, dressing, self feeding, reach over head, and hygiene/grooming  PARTICIPATION LIMITATIONS: meal prep, cleaning, laundry, interpersonal relationship, driving, shopping, and community activity  PERSONAL FACTORS:  No specific factors  are also affecting patient's functional outcome.   REHAB POTENTIAL: Good  CLINICAL DECISION MAKING: Stable/uncomplicated  EVALUATION COMPLEXITY:  Low   GOALS: Goals reviewed with patient? Yes  SHORT TERM GOALS: (target date for Short term goals are 3 weeks 03/09/2023)  1.Patient will demonstrate independent use of home exercise program to maintain progress from in clinic treatments. Goal status: on going 02/24/2023  LONG TERM GOALS: (target dates for all long term goals are 10 weeks  04/27/2023 )   1. Patient will demonstrate/report pain at worst less than or equal to 2/10 to facilitate minimal limitation in daily activity secondary to pain symptoms. Goal status: New   2. Patient will demonstrate independent use of home exercise program to facilitate ability to maintain/progress functional gains from skilled physical therapy services. Goal status: New   3. Patient will demonstrate FOTO outcome > or = 61 % to indicate reduced disability due to condition. Goal status: New   4.  Patient will demonstrate Rt UE MMT 4/5 throughout to facilitate lifting, reaching, carrying at [facility] in daily activity.   Goal status: New   5.  Patient will demonstrate Rt GH joint AROM WFL s symptoms to facilitate usual overhead reaching, self care, dressing at PLOF.    Goal status: New   6.  Patient will demonstrate/report ability to perform work at [employer].  Goal status: New    PLAN:  PT FREQUENCY: 1-3x/week  PT DURATION: 10 weeks  PLANNED INTERVENTIONS: Aquatic PT Therapeutic exercises, Therapeutic activity, Neuro Muscular re-education, Balance training, Gait training, Patient/Family education, Joint mobilization, Stair training, DME instructions, Dry Needling, Electrical stimulation, Traction, Cryotherapy, vasopneumatic device Moist heat, Taping, Ultrasound, Ionotophoresis 4mg /ml Dexamethasone, and aquatic therapy ,Manual therapy.  All included unless contraindicated  PLAN FOR NEXT SESSION: Strengthening as tolerated with end range range gains.    No ER past 30 degrees, No IR activation/strengthening.   Chyrel Masson, PT, DPT, OCS,  ATC 03/15/23  9:24 AM

## 2023-03-16 ENCOUNTER — Encounter: Payer: BC Managed Care – PPO | Admitting: Rehabilitative and Restorative Service Providers"

## 2023-03-17 ENCOUNTER — Ambulatory Visit (INDEPENDENT_AMBULATORY_CARE_PROVIDER_SITE_OTHER): Payer: BC Managed Care – PPO | Admitting: Physical Therapy

## 2023-03-17 ENCOUNTER — Encounter: Payer: Self-pay | Admitting: Physical Therapy

## 2023-03-17 DIAGNOSIS — R600 Localized edema: Secondary | ICD-10-CM | POA: Diagnosis not present

## 2023-03-17 DIAGNOSIS — M25511 Pain in right shoulder: Secondary | ICD-10-CM | POA: Diagnosis not present

## 2023-03-17 DIAGNOSIS — M6281 Muscle weakness (generalized): Secondary | ICD-10-CM

## 2023-03-17 DIAGNOSIS — G8929 Other chronic pain: Secondary | ICD-10-CM

## 2023-03-17 NOTE — Therapy (Signed)
OUTPATIENT PHYSICAL THERAPY TREATMENT   Patient Name: Hannah [name] MRN: [id number] DOB:01/10/1961, 62 y.o., female Today's Date: 03/17/2023  END OF SESSION:  PT End of Session - 03/17/23 0918     Visit Number 8    Number of Visits 20    Date for PT Re-Evaluation 04/27/23    Authorization Type [employer] 20% coinsurance    Progress Note Due on Visit 10    PT Start Time [phone]    PT Stop Time 0919    PT Time Calculation (min) 38 min    Activity Tolerance Patient tolerated treatment well    Behavior During Therapy [facility] for tasks assessed/performed                  Past Medical History:  Diagnosis Date   Allergy    Anemia    Anxiety    Arthritis    Asthma    Cancer (HCC)    multiple myeloma   Depression    Hypertension    Past Surgical History:  Procedure Laterality Date   ABDOMINAL HYSTERECTOMY     BICEPT TENODESIS Right 01/31/2023   Procedure: BICEPS TENODESIS;  Surgeon: Cammy Copa, MD;  Location: [facility] OR;  Service: Orthopedics;  Laterality: Right;   CHOLECYSTECTOMY  1993   COLONOSCOPY     HERNIA REPAIR     REVERSE SHOULDER ARTHROPLASTY Right 01/31/2023   Procedure: REVERSE SHOULDER ARTHROPLASTY;  Surgeon: Cammy Copa, MD;  Location: [facility] OR;  Service: Orthopedics;  Laterality: Right;   TOTAL KNEE ARTHROPLASTY Right 01/28/2016   TOTAL KNEE ARTHROPLASTY Right 01/28/2016   Procedure: RIGHT TOTAL KNEE ARTHROPLASTY;  Surgeon: Tarry Kos, MD;  Location: [facility] OR;  Service: Orthopedics;  Laterality: Right;   TRIGGER FINGER RELEASE Right 06/18/2014   Procedure: RIGHT LONG FINGER TRIGGER RELEASE;  Surgeon: Cheral Almas, MD;  Location: [facility];  Service: Orthopedics;  Laterality: Right;   TUBAL LIGATION     VAGINAL HYSTERECTOMY Bilateral 11/04/2016   Procedure: HYSTERECTOMY VAGINAL with Bilateral Salpingectomy;  Surgeon: Hal Morales, MD;  Location: [facility];  Service: Gynecology;  Laterality: Bilateral;   WEIL OSTEOTOMY Right  07/07/2020   Procedure: RIGHT FOOT WEIL OSTEOTOMY 2, 3, AND 4 METATARSALS AND PROXIMAL INTERPHALANGEAL JOINT FUSION 2 & 4 TOES;  Surgeon: Nadara Mustard, MD;  Location: [facility];  Service: Orthopedics;  Laterality: Right;   Patient Active Problem List   Diagnosis Date Noted   Biceps tendonitis on right 02/11/2023   S/P reverse total shoulder arthroplasty, right 01/31/2023   Multiple myeloma in remission (HCC) 07/13/2020   Claw toe, right    Trigger finger, left middle finger 04/09/2020   Stenosing tenosynovitis of finger of left hand 02/27/2020   Hemoglobin C trait (HCC) 02/27/2018   Major depression, recurrent (HCC) 01/12/2018   Chronic right shoulder pain 12/20/2017   MDD (major depressive disorder), recurrent episode, moderate (HCC) 11/04/2017   Primary osteoarthritis of both hands 09/26/2017   Primary osteoarthritis of right shoulder 09/26/2017   Primary osteoarthritis of both hips 09/26/2017   Status post total knee replacement, right 09/26/2017   Primary osteoarthritis of both feet 09/26/2017   History of asthma 09/26/2017   Primary osteoarthritis of left knee 04/18/2017   Menorrhagia 11/04/2016   Fibroid tumor 11/04/2016   Adenomyosis 11/04/2016   Hypertension, essential 11/04/2016   Total knee replacement status 01/28/2016    PCP: Gaspar Garbe MD  REFERRING PROVIDER: Julieanne Cotton, PA-C  REFERRING  DIAG: Z96.611 (ICD-10-CM) - S/P reverse total shoulder arthroplasty, right  THERAPY DIAG:  Chronic right shoulder pain  Muscle weakness (generalized)  Localized edema  Rationale for Evaluation and Treatment: Rehabilitation  ONSET DATE: surgery 01/31/2023  SUBJECTIVE:                                                                                                                                                                                      SUBJECTIVE STATEMENT: Pt states some soreness in her shoulder but pain is not bad  PERTINENT  HISTORY: PMH anxiety CA myeloma depression HTN Rt TKA   PAIN:  NPRS scale: 4/10 overall Pain location: Rt shoulder, general shoulder  Pain description: achy Aggravating factors: arm movements, arm handing out of sling.  Relieving factors: pain med, OTC medicine  PRECAUTIONS: Shoulder - "Do not want to externally rotate past 30 degrees to protect subscapularis repair "  WEIGHT BEARING RESTRICTIONS: Yes Rt shoulder  FALLS:  Has patient fallen in last 6 months? No  LIVING ENVIRONMENT: Lives in: House/apartment   OCCUPATION: Work from home on computer.   PLOF: Independent, light gardening. Walking for exercise.   PATIENT GOALS: Reduce pain, get arm back to ROM/use.   OBJECTIVE:   PATIENT SURVEYS:  03/15/2023 FOTO update:  54  02/16/2023 FOTO intake:  29   predicted:  61  COGNITION: 02/16/2023 Overall cognitive status: WFL     SENSATION: 02/16/2023 WFL  POSTURE: 02/16/2023 No specific.   UPPER EXTREMITY ROM:   ROM Right 02/16/2023 AROM (PROM) In supine  Right 02/24/2023 AROM in supine Right 03/01/2023 AROM in supine   Shoulder flexion 50 (90) 100 112  Shoulder extension     Shoulder abduction (62)    Shoulder adduction     Shoulder internal rotation To belly    Shoulder external rotation NT    Elbow flexion     Elbow extension     Wrist flexion     Wrist extension     Wrist ulnar deviation     Wrist radial deviation     Wrist pronation     Wrist supination     (Blank rows = not tested)  UPPER EXTREMITY MMT:  MMT Right 02/16/2023 Left 02/16/2023  Shoulder flexion NT 5/5  Shoulder extension    Shoulder abduction NT 5/5  Shoulder adduction    Shoulder internal rotation NT 5/5  Shoulder external rotation NT 5/5  Middle trapezius    Lower trapezius    Elbow flexion  5/5  Elbow extension  5/5  Wrist flexion    Wrist extension    Wrist ulnar deviation    Wrist radial deviation  Wrist pronation    Wrist supination    Grip strength (lbs)     (Blank rows = not tested)  SHOULDER SPECIAL TESTS: 02/16/2023 No tested today  JOINT MOBILITY TESTING:  02/16/2023 No testing today  PALPATION:  02/16/2023 General tenderness to light touch around shoulder joint.                                                                                                                                                                                                   TODAY'S TREATMENT:                                                                                        03/17/23 Pt seen for aquatic therapy today.  Treatment took place in water 3.5-4.75 ft in depth at the [facility] pool. Temp of water was 91.  Pt entered/exited the pool via stairs  Pt requires the buoyancy and hydrostatic pressure of water for support, and to offload joints by unweighting joint load by at least 50 % in navel deep water and by at least 75-80% in chest to neck deep water.  Viscosity of the water is needed for resistance of strengthening. Water current perturbations provides challenge to standing balance requiring increased core activation.  exercises Walking forward 4 round trips holding water dumbells at 90 deg Walking backward 4 round trips holding water dumbells at 90 deg Walking sideways 4 round trips holding water dumbells at 90 deg Standing L stretch for shoulder flexion and lumbar flexion 10 sec X10 Shoulder adduction with mini squat using dumbells 2X15 Shoulder horizontal abd/add with dumbells 2X15 Shoulder flexion/extension with rainbow dumbells 2X15 Shoulder cirlces with dumbell while holding squat 2X15 CW and CCW Push/Pull using kickboard while holding lunge 2X15 Hip abd/add swings X 15 bilat Hip flexion/extension swings X 15 bilat Standing L stretch again for shoulder/lumbar flexion 10 sec X 10 holding onto pool rail for increased stretch due to incline of rail  DATE:  03/15/2023 Therex: UBE Lvl 2.5 ,  3 mins fwd/back each way with 30 sec  rest breaks.  Pulleys flexion , scaption with outstretched arm and eccentric lowering focus  Machine rows 2 x 15 10 lbs  Red band ER walk out holds isometric  reactive 5 sec hold x 15 with red band Seated wand AAROM flexion against gravity x 10  Additional time for slow movement control focus and rest after activity to recover.     TODAY'S TREATMENT:                                                                                        DATE:  03/07/2023 Therex: UBE Lvl  3,  3 mins fwd/back each way  Standing pball roll ups wall for shoulder flexion and lumbar extension 5 sec X 10 Seated lumbar flexion stretch with pball 5 sec X 10 Supine AAROM flexion, abd, ER, all 2 x 10 to tolerance  Pulley flexion, scaption 2 mins each way Tband rows green 2 x 15  Manual Rt shoulder passive range in flexion, scaption, abduction to tolerance.    PATIENT EDUCATION: 02/24/2023 Education details: HEP update Person educated: Patient Education method: Programmer, multimedia, Demonstration, Verbal cues, and Handouts Education comprehension: verbalized understanding, returned demonstration, and verbal cues required  HOME EXERCISE PROGRAM: Access Code: [email or web address] URL: [email or web address] Date: 02/24/2023 Prepared by: Chyrel Masson  Exercises - Seated Scapular Retraction  - 3-5 x daily - 7 x weekly - 1 sets - 10 reps - 3-5 hold - Standing Horizontal Shoulder Pendulum Supported with Arm Bent  - 2-3 x daily - 7 x weekly - 1-2 sets - 10 reps - Standing Flexion Extension Shoulder Pendulum Supported with Arm Bent  - 2-3 x daily - 7 x weekly - 1-2 sets - 10 reps - Supine Shoulder Flexion Extension AAROM with Dowel  - 2-3 x daily - 7 x weekly - 1-2 sets - 10-15 reps - 3 hold - Standing Isometric Shoulder Flexion with Doorway - Arm Bent  - 1-2 x daily - 7 x weekly - 1 sets - 10 reps - 5-10 hold - Standing Isometric Shoulder Abduction with Doorway - Arm Bent (Mirrored)  - 1-2 x daily - 7 x weekly - 1 sets  - 10 reps - 5-10 hold  ASSESSMENT:  CLINICAL IMPRESSION: She had good tolerance to aquatic PT session focusing on shoulder ROM and strengthening along with core work. She will continue to benefit from skilled PT to improve function of her shoulder  OBJECTIVE IMPAIRMENTS: decreased activity tolerance, decreased coordination, decreased endurance, decreased mobility, decreased ROM, decreased strength, hypomobility, increased edema, increased fascial restrictions, impaired perceived functional ability, increased muscle spasms, impaired flexibility, impaired UE functional use, improper body mechanics, and pain.   ACTIVITY LIMITATIONS: carrying, lifting, bending, sleeping, bed mobility, bathing, toileting, dressing, self feeding, reach over head, and hygiene/grooming  PARTICIPATION LIMITATIONS: meal prep, cleaning, laundry, interpersonal relationship, driving, shopping, and community activity  PERSONAL FACTORS:  No specific factors  are also affecting patient's functional outcome.   REHAB POTENTIAL: Good  CLINICAL DECISION MAKING: Stable/uncomplicated  EVALUATION COMPLEXITY: Low   GOALS: Goals reviewed with patient? Yes  SHORT TERM GOALS: (target date for Short term goals are 3 weeks 03/09/2023)  1.Patient will demonstrate independent use of home exercise program to maintain progress from in clinic treatments. Goal status: on going 02/24/2023  LONG TERM GOALS: (target dates for all long term goals are 10  weeks  04/27/2023 )   1. Patient will demonstrate/report pain at worst less than or equal to 2/10 to facilitate minimal limitation in daily activity secondary to pain symptoms. Goal status: New   2. Patient will demonstrate independent use of home exercise program to facilitate ability to maintain/progress functional gains from skilled physical therapy services. Goal status: New   3. Patient will demonstrate FOTO outcome > or = 61 % to indicate reduced disability due to  condition. Goal status: New   4.  Patient will demonstrate Rt UE MMT 4/5 throughout to facilitate lifting, reaching, carrying at [facility] in daily activity.   Goal status: New   5.  Patient will demonstrate Rt GH joint AROM WFL s symptoms to facilitate usual overhead reaching, self care, dressing at PLOF.    Goal status: New   6.  Patient will demonstrate/report ability to perform work at [employer].  Goal status: New    PLAN:  PT FREQUENCY: 1-3x/week  PT DURATION: 10 weeks  PLANNED INTERVENTIONS: Aquatic PT Therapeutic exercises, Therapeutic activity, Neuro Muscular re-education, Balance training, Gait training, Patient/Family education, Joint mobilization, Stair training, DME instructions, Dry Needling, Electrical stimulation, Traction, Cryotherapy, vasopneumatic device Moist heat, Taping, Ultrasound, Ionotophoresis 4mg /ml Dexamethasone, and aquatic therapy ,Manual therapy.  All included unless contraindicated  PLAN FOR NEXT SESSION: Strengthening as tolerated with end range range gains.    No ER past 30 degrees, No IR activation/strengthening.   Ivery Quale, PT, DPT 03/17/23 9:21 AM

## 2023-03-18 ENCOUNTER — Encounter: Payer: Self-pay | Admitting: Hematology

## 2023-03-20 ENCOUNTER — Encounter: Payer: BC Managed Care – PPO | Admitting: Rehabilitative and Restorative Service Providers"

## 2023-03-21 ENCOUNTER — Encounter: Payer: Self-pay | Admitting: Hematology

## 2023-03-21 ENCOUNTER — Other Ambulatory Visit: Payer: Self-pay | Admitting: Hematology

## 2023-03-21 MED ORDER — LENALIDOMIDE 10 MG PO CAPS: 10.0000 mg | ORAL_CAPSULE | Freq: Every day | ORAL | 0 refills | Status: DC

## 2023-03-22 ENCOUNTER — Encounter: Payer: BC Managed Care – PPO | Admitting: Rehabilitative and Restorative Service Providers"

## 2023-03-24 ENCOUNTER — Other Ambulatory Visit: Payer: Self-pay

## 2023-03-27 ENCOUNTER — Encounter: Payer: Self-pay | Admitting: Rehabilitative and Restorative Service Providers"

## 2023-03-27 ENCOUNTER — Ambulatory Visit (INDEPENDENT_AMBULATORY_CARE_PROVIDER_SITE_OTHER): Payer: BC Managed Care – PPO | Admitting: Rehabilitative and Restorative Service Providers"

## 2023-03-27 DIAGNOSIS — G8929 Other chronic pain: Secondary | ICD-10-CM

## 2023-03-27 DIAGNOSIS — M6281 Muscle weakness (generalized): Secondary | ICD-10-CM

## 2023-03-27 DIAGNOSIS — M25511 Pain in right shoulder: Secondary | ICD-10-CM

## 2023-03-27 DIAGNOSIS — R600 Localized edema: Secondary | ICD-10-CM | POA: Diagnosis not present

## 2023-03-27 NOTE — Therapy (Signed)
OUTPATIENT PHYSICAL THERAPY TREATMENT / PROGRESS NOTE   Patient Name: PENNE BRAMAN MRN: [id number] DOB:11-21-1960, 62 y.o., female Today's Date: 03/27/2023  Progress Note Reporting Period 02/16/2023 to 03/27/2023  See note below for Objective Data and Assessment of Progress/Goals.    END OF SESSION:  PT End of Session - 03/27/23 1023     Visit Number 9    Number of Visits 20    Date for PT Re-Evaluation 04/27/23    Authorization Type [employer] 20% coinsurance    Progress Note Due on Visit 19    PT Start Time 1017    PT Stop Time 1057    PT Time Calculation (min) 40 min    Activity Tolerance Patient tolerated treatment well    Behavior During Therapy WFL for tasks assessed/performed              Past Medical History:  Diagnosis Date   Allergy    Anemia    Anxiety    Arthritis    Asthma    Cancer (HCC)    multiple myeloma   Depression    Hypertension    Past Surgical History:  Procedure Laterality Date   ABDOMINAL HYSTERECTOMY     BICEPT TENODESIS Right 01/31/2023   Procedure: BICEPS TENODESIS;  Surgeon: Cammy Copa, MD;  Location: [facility] OR;  Service: Orthopedics;  Laterality: Right;   CHOLECYSTECTOMY  1993   COLONOSCOPY     HERNIA REPAIR     REVERSE SHOULDER ARTHROPLASTY Right 01/31/2023   Procedure: REVERSE SHOULDER ARTHROPLASTY;  Surgeon: Cammy Copa, MD;  Location: [facility] OR;  Service: Orthopedics;  Laterality: Right;   TOTAL KNEE ARTHROPLASTY Right 01/28/2016   TOTAL KNEE ARTHROPLASTY Right 01/28/2016   Procedure: RIGHT TOTAL KNEE ARTHROPLASTY;  Surgeon: Tarry Kos, MD;  Location: [facility] OR;  Service: Orthopedics;  Laterality: Right;   TRIGGER FINGER RELEASE Right 06/18/2014   Procedure: RIGHT LONG FINGER TRIGGER RELEASE;  Surgeon: Cheral Almas, MD;  Location: [facility];  Service: Orthopedics;  Laterality: Right;   TUBAL LIGATION     VAGINAL HYSTERECTOMY Bilateral 11/04/2016   Procedure: HYSTERECTOMY VAGINAL with Bilateral  Salpingectomy;  Surgeon: Hal Morales, MD;  Location: [facility] ORS;  Service: Gynecology;  Laterality: Bilateral;   WEIL OSTEOTOMY Right 07/07/2020   Procedure: RIGHT FOOT WEIL OSTEOTOMY 2, 3, AND 4 METATARSALS AND PROXIMAL INTERPHALANGEAL JOINT FUSION 2 & 4 TOES;  Surgeon: Nadara Mustard, MD;  Location:  SURGERY CENTER;  Service: Orthopedics;  Laterality: Right;   Patient Active Problem List   Diagnosis Date Noted   Biceps tendonitis on right 02/11/2023   S/P reverse total shoulder arthroplasty, right 01/31/2023   Multiple myeloma in remission (HCC) 07/13/2020   Claw toe, right    Trigger finger, left middle finger 04/09/2020   Stenosing tenosynovitis of finger of left hand 02/27/2020   Hemoglobin C trait (HCC) 02/27/2018   Major depression, recurrent (HCC) 01/12/2018   Chronic right shoulder pain 12/20/2017   MDD (major depressive disorder), recurrent episode, moderate (HCC) 11/04/2017   Primary osteoarthritis of both hands 09/26/2017   Primary osteoarthritis of right shoulder 09/26/2017   Primary osteoarthritis of both hips 09/26/2017   Status post total knee replacement, right 09/26/2017   Primary osteoarthritis of both feet 09/26/2017   History of asthma 09/26/2017   Primary osteoarthritis of left knee 04/18/2017   Menorrhagia 11/04/2016   Fibroid tumor 11/04/2016   Adenomyosis 11/04/2016   Hypertension, essential 11/04/2016   Total knee  replacement status 01/28/2016    PCP: Gaspar Garbe MD  REFERRING PROVIDER: Julieanne Cotton, PA-C  REFERRING DIAG: [phone] (ICD-10-CM) - S/P reverse total shoulder arthroplasty, right  THERAPY DIAG:  Chronic right shoulder pain  Muscle weakness (generalized)  Localized edema  Rationale for Evaluation and Treatment: Rehabilitation  ONSET DATE: surgery 01/31/2023  SUBJECTIVE:                                                                                                                                                                                       SUBJECTIVE STATEMENT: Pt indicated arm seems to be getting better.  Reported incision tenderness.  Pt indicated having trouble with hip check ups/imaging.   Has referral for therapy for back/hip aquatic.  Return to work Nov 4th.   PERTINENT HISTORY: PMH anxiety CA myeloma depression HTN Rt TKA   PAIN:  NPRS scale: 2-3/10 Pain location: Rt shoulder, general shoulder  Pain description: achy Aggravating factors: arm movements, arm handing out of sling.  Relieving factors: pain med, OTC medicine  PRECAUTIONS: Shoulder - "Do not want to externally rotate past 30 degrees to protect subscapularis repair "  WEIGHT BEARING RESTRICTIONS: Yes Rt shoulder  FALLS:  Has patient fallen in last 6 months? No  LIVING ENVIRONMENT: Lives in: House/apartment   OCCUPATION: Work from home on computer.   PLOF: Independent, light gardening. Walking for exercise.   PATIENT GOALS: Reduce pain, get arm back to ROM/use.   OBJECTIVE:   PATIENT SURVEYS:  03/15/2023 FOTO update:  54  02/16/2023 FOTO intake:  29   predicted:  61  COGNITION: 02/16/2023 Overall cognitive status: WFL     SENSATION: 02/16/2023 WFL  POSTURE: 02/16/2023 No specific.   UPPER EXTREMITY ROM:   ROM Right 02/16/2023 AROM (PROM) In supine  Right 02/24/2023 AROM in supine Right 03/01/2023 AROM in supine  Right 03/27/2023 AROM in supine  Shoulder flexion 50 (90) 100 112 126  Shoulder extension      Shoulder abduction (62)   105  Shoulder adduction      Shoulder internal rotation To belly     Shoulder external rotation NT     Elbow flexion      Elbow extension      Wrist flexion      Wrist extension      Wrist ulnar deviation      Wrist radial deviation      Wrist pronation      Wrist supination      (Blank rows = not tested)  UPPER EXTREMITY MMT:  MMT Right 02/16/2023 Left 02/16/2023 Right 03/27/2023  Shoulder flexion NT 5/5 4/5 in available range  Shoulder  extension     Shoulder abduction NT 5/5 4/5 in available range  Shoulder adduction     Shoulder internal rotation NT 5/5   Shoulder external rotation NT 5/5 4/5  Middle trapezius     Lower trapezius     Elbow flexion  5/5   Elbow extension  5/5   Wrist flexion     Wrist extension     Wrist ulnar deviation     Wrist radial deviation     Wrist pronation     Wrist supination     Grip strength (lbs)     (Blank rows = not tested)  SHOULDER SPECIAL TESTS: 02/16/2023 No tested today  JOINT MOBILITY TESTING:  02/16/2023 No testing today  PALPATION:  02/16/2023 General tenderness to light touch around shoulder joint.                                                                                                                                                                                                  TODAY'S TREATMENT:                                                                                 DATE: 03/27/2023 Therex: Sidelying Rt shoulder abduction 2 x 15 Sidelying Rt shoulder ER 2 x 15 c towel under arm Standing green band rows 2 x 15 bilateral Standing green band gh ext 2 x 15 bilateral UBE fwd/back 4 mins each way lvl 2.5  Wall slides flexion 2-3 sec hold x 10  Discussed new HEP and cues for continued progression at home.     TODAY'S TREATMENT:                                                                                 DATE: 03/17/23 Pt seen for aquatic therapy today.  Treatment took place in water 3.5-4.75 ft in depth at the [facility] pool. Temp of water was 91.  Pt entered/exited the pool via  stairs  Pt requires the buoyancy and hydrostatic pressure of water for support, and to offload joints by unweighting joint load by at least 50 % in navel deep water and by at least 75-80% in chest to neck deep water.  Viscosity of the water is needed for resistance of strengthening. Water current perturbations provides challenge to standing balance requiring  increased core activation.  exercises Walking forward 4 round trips holding water dumbells at 90 deg Walking backward 4 round trips holding water dumbells at 90 deg Walking sideways 4 round trips holding water dumbells at 90 deg Standing L stretch for shoulder flexion and lumbar flexion 10 sec X10 Shoulder adduction with mini squat using dumbells 2X15 Shoulder horizontal abd/add with dumbells 2X15 Shoulder flexion/extension with rainbow dumbells 2X15 Shoulder cirlces with dumbell while holding squat 2X15 CW and CCW Push/Pull using kickboard while holding lunge 2X15 Hip abd/add swings X 15 bilat Hip flexion/extension swings X 15 bilat Standing L stretch again for shoulder/lumbar flexion 10 sec X 10 holding onto pool rail for increased stretch due to incline of rail  TODAY'S TREATMENT:                                                                                 DATE: 03/15/2023 Therex: UBE Lvl 2.5 ,  3 mins fwd/back each way with 30 sec rest breaks.  Pulleys flexion , scaption with outstretched arm and eccentric lowering focus  Machine rows 2 x 15 10 lbs  Red band ER walk out holds isometric reactive 5 sec hold x 15 with red band Seated wand AAROM flexion against gravity x 10  Additional time for slow movement control focus and rest after activity to recover.     TODAY'S TREATMENT:                                                                                        DATE:  03/07/2023 Therex: UBE Lvl  3,  3 mins fwd/back each way  Standing pball roll ups wall for shoulder flexion and lumbar extension 5 sec X 10 Seated lumbar flexion stretch with pball 5 sec X 10 Supine AAROM flexion, abd, ER, all 2 x 10 to tolerance  Pulley flexion, scaption 2 mins each way Tband rows green 2 x 15  Manual Rt shoulder passive range in flexion, scaption, abduction to tolerance.    PATIENT EDUCATION: 02/24/2023 Education details: HEP update Person educated: Patient Education method:  Programmer, multimedia, Demonstration, Verbal cues, and Handouts Education comprehension: verbalized understanding, returned demonstration, and verbal cues required  HOME EXERCISE PROGRAM: Access Code: [email or web address] URL: [email or web address] Date: 02/24/2023 Prepared by: Chyrel Masson  Exercises - Seated Scapular Retraction  - 3-5 x daily - 7 x weekly - 1 sets - 10 reps - 3-5 hold - Standing Horizontal Shoulder Pendulum Supported with Arm Bent  - 2-3 x daily -  7 x weekly - 1-2 sets - 10 reps - Standing Flexion Extension Shoulder Pendulum Supported with Arm Bent  - 2-3 x daily - 7 x weekly - 1-2 sets - 10 reps - Supine Shoulder Flexion Extension AAROM with Dowel  - 2-3 x daily - 7 x weekly - 1-2 sets - 10-15 reps - 3 hold - Standing Isometric Shoulder Flexion with Doorway - Arm Bent  - 1-2 x daily - 7 x weekly - 1 sets - 10 reps - 5-10 hold - Standing Isometric Shoulder Abduction with Doorway - Arm Bent (Mirrored)  - 1-2 x daily - 7 x weekly - 1 sets - 10 reps - 5-10 hold  ASSESSMENT:  CLINICAL IMPRESSION: Check of Rt shoulder AROM showed improvement with continued impairment still noted to continue to work on to gain more functional movement.  Able to progress to AROM training against gravity to improve strength in Rt arm as well.  Continued skilled PT services indicated at this time.   OBJECTIVE IMPAIRMENTS: decreased activity tolerance, decreased coordination, decreased endurance, decreased mobility, decreased ROM, decreased strength, hypomobility, increased edema, increased fascial restrictions, impaired perceived functional ability, increased muscle spasms, impaired flexibility, impaired UE functional use, improper body mechanics, and pain.   ACTIVITY LIMITATIONS: carrying, lifting, bending, sleeping, bed mobility, bathing, toileting, dressing, self feeding, reach over head, and hygiene/grooming  PARTICIPATION LIMITATIONS: meal prep, cleaning, laundry, interpersonal relationship,  driving, shopping, and community activity  PERSONAL FACTORS:  No specific factors  are also affecting patient's functional outcome.   REHAB POTENTIAL: Good  CLINICAL DECISION MAKING: Stable/uncomplicated  EVALUATION COMPLEXITY: Low   GOALS: Goals reviewed with patient? Yes  SHORT TERM GOALS: (target date for Short term goals are 3 weeks 03/09/2023)  1.Patient will demonstrate independent use of home exercise program to maintain progress from in clinic treatments. Goal status: on going 02/24/2023  LONG TERM GOALS: (target dates for all long term goals are 10 weeks  04/27/2023 )   1. Patient will demonstrate/report pain at worst less than or equal to 2/10 to facilitate minimal limitation in daily activity secondary to pain symptoms. Goal status: New   2. Patient will demonstrate independent use of home exercise program to facilitate ability to maintain/progress functional gains from skilled physical therapy services. Goal status: New   3. Patient will demonstrate FOTO outcome > or = 61 % to indicate reduced disability due to condition. Goal status: New   4.  Patient will demonstrate Rt UE MMT 4/5 throughout to facilitate lifting, reaching, carrying at [facility] in daily activity.   Goal status: New   5.  Patient will demonstrate Rt GH joint AROM WFL s symptoms to facilitate usual overhead reaching, self care, dressing at PLOF.    Goal status: New   6.  Patient will demonstrate/report ability to perform work at [employer].  Goal status: New    PLAN:  PT FREQUENCY: 1-3x/week  PT DURATION: 10 weeks  PLANNED INTERVENTIONS: Aquatic PT Therapeutic exercises, Therapeutic activity, Neuro Muscular re-education, Balance training, Gait training, Patient/Family education, Joint mobilization, Stair training, DME instructions, Dry Needling, Electrical stimulation, Traction, Cryotherapy, vasopneumatic device Moist heat, Taping, Ultrasound, Ionotophoresis 4mg /ml Dexamethasone, and aquatic therapy  ,Manual therapy.  All included unless contraindicated  PLAN FOR NEXT SESSION: Continued early strengthening, range gains.    No ER past 30 degrees, No IR activation/strengthening.    Chyrel Masson, PT, DPT, OCS, ATC 03/27/23  11:05 AM

## 2023-03-29 ENCOUNTER — Ambulatory Visit (INDEPENDENT_AMBULATORY_CARE_PROVIDER_SITE_OTHER): Payer: BC Managed Care – PPO | Admitting: Physical Therapy

## 2023-03-29 ENCOUNTER — Encounter: Payer: BC Managed Care – PPO | Admitting: Rehabilitative and Restorative Service Providers"

## 2023-03-29 ENCOUNTER — Encounter: Payer: Self-pay | Admitting: Physical Therapy

## 2023-03-29 DIAGNOSIS — M6281 Muscle weakness (generalized): Secondary | ICD-10-CM

## 2023-03-29 DIAGNOSIS — G8929 Other chronic pain: Secondary | ICD-10-CM | POA: Diagnosis not present

## 2023-03-29 DIAGNOSIS — M25511 Pain in right shoulder: Secondary | ICD-10-CM

## 2023-03-29 DIAGNOSIS — R600 Localized edema: Secondary | ICD-10-CM | POA: Diagnosis not present

## 2023-03-29 NOTE — Therapy (Signed)
OUTPATIENT PHYSICAL THERAPY TREATMENT   Patient Name: Hannah [name] MRN: [id number] DOB:1960-11-27, 62 y.o., female Today's Date: 03/29/2023    END OF SESSION:  PT End of Session - 03/29/23 0858     Visit Number 10    Number of Visits 20    Date for PT Re-Evaluation 04/27/23    Authorization Type [employer] 20% coinsurance    Progress Note Due on Visit 19    PT Start Time 0842    PT Stop Time 0920    PT Time Calculation (min) 38 min    Activity Tolerance Patient tolerated treatment well    Behavior During Therapy [facility] for tasks assessed/performed              Past Medical History:  Diagnosis Date   Allergy    Anemia    Anxiety    Arthritis    Asthma    Cancer (HCC)    multiple myeloma   Depression    Hypertension    Past Surgical History:  Procedure Laterality Date   ABDOMINAL HYSTERECTOMY     BICEPT TENODESIS Right 01/31/2023   Procedure: BICEPS TENODESIS;  Surgeon: Cammy Copa, MD;  Location: [facility] OR;  Service: Orthopedics;  Laterality: Right;   CHOLECYSTECTOMY  1993   COLONOSCOPY     HERNIA REPAIR     REVERSE SHOULDER ARTHROPLASTY Right 01/31/2023   Procedure: REVERSE SHOULDER ARTHROPLASTY;  Surgeon: Cammy Copa, MD;  Location: [facility] OR;  Service: Orthopedics;  Laterality: Right;   TOTAL KNEE ARTHROPLASTY Right 01/28/2016   TOTAL KNEE ARTHROPLASTY Right 01/28/2016   Procedure: RIGHT TOTAL KNEE ARTHROPLASTY;  Surgeon: Tarry Kos, MD;  Location: [facility] OR;  Service: Orthopedics;  Laterality: Right;   TRIGGER FINGER RELEASE Right 06/18/2014   Procedure: RIGHT LONG FINGER TRIGGER RELEASE;  Surgeon: Cheral Almas, MD;  Location: [facility];  Service: Orthopedics;  Laterality: Right;   TUBAL LIGATION     VAGINAL HYSTERECTOMY Bilateral 11/04/2016   Procedure: HYSTERECTOMY VAGINAL with Bilateral Salpingectomy;  Surgeon: Hal Morales, MD;  Location: [facility];  Service: Gynecology;  Laterality: Bilateral;   WEIL OSTEOTOMY Right 07/07/2020    Procedure: RIGHT FOOT WEIL OSTEOTOMY 2, 3, AND 4 METATARSALS AND PROXIMAL INTERPHALANGEAL JOINT FUSION 2 & 4 TOES;  Surgeon: Nadara Mustard, MD;  Location: [facility];  Service: Orthopedics;  Laterality: Right;   Patient Active Problem List   Diagnosis Date Noted   Biceps tendonitis on right 02/11/2023   S/P reverse total shoulder arthroplasty, right 01/31/2023   Multiple myeloma in remission (HCC) 07/13/2020   Claw toe, right    Trigger finger, left middle finger 04/09/2020   Stenosing tenosynovitis of finger of left hand 02/27/2020   Hemoglobin C trait (HCC) 02/27/2018   Major depression, recurrent (HCC) 01/12/2018   Chronic right shoulder pain 12/20/2017   MDD (major depressive disorder), recurrent episode, moderate (HCC) 11/04/2017   Primary osteoarthritis of both hands 09/26/2017   Primary osteoarthritis of right shoulder 09/26/2017   Primary osteoarthritis of both hips 09/26/2017   Status post total knee replacement, right 09/26/2017   Primary osteoarthritis of both feet 09/26/2017   History of asthma 09/26/2017   Primary osteoarthritis of left knee 04/18/2017   Menorrhagia 11/04/2016   Fibroid tumor 11/04/2016   Adenomyosis 11/04/2016   Hypertension, essential 11/04/2016   Total knee replacement status 01/28/2016    PCP: Gaspar Garbe MD  REFERRING PROVIDER: Julieanne Cotton, PA-C  REFERRING DIAG: [phone] (  ICD-10-CM) - S/P reverse total shoulder arthroplasty, right  THERAPY DIAG:  Chronic right shoulder pain  Muscle weakness (generalized)  Localized edema  Rationale for Evaluation and Treatment: Rehabilitation  ONSET DATE: surgery 01/31/2023  SUBJECTIVE:                                                                                                                                                                                      SUBJECTIVE STATEMENT: Pt states her shoulder is feeling a lot better overall   PERTINENT HISTORY: PMH  anxiety CA myeloma depression HTN Rt TKA   PAIN:  NPRS scale:1/10 Pain location: Rt shoulder, general shoulder  Pain description: achy Aggravating factors: arm movements, arm handing out of sling.  Relieving factors: pain med, OTC medicine  PRECAUTIONS: Shoulder - "Do not want to externally rotate past 30 degrees to protect subscapularis repair " No longer needed after 03/29/23  WEIGHT BEARING RESTRICTIONS: Yes Rt shoulder  FALLS:  Has patient fallen in last 6 months? No  LIVING ENVIRONMENT: Lives in: House/apartment   OCCUPATION: Work from home on computer.   PLOF: Independent, light gardening. Walking for exercise.   PATIENT GOALS: Reduce pain, get arm back to ROM/use.   OBJECTIVE:   PATIENT SURVEYS:  03/15/2023 FOTO update:  54  02/16/2023 FOTO intake:  29   predicted:  61  COGNITION: 02/16/2023 Overall cognitive status: WFL     SENSATION: 02/16/2023 WFL  POSTURE: 02/16/2023 No specific.   UPPER EXTREMITY ROM:   ROM Right 02/16/2023 AROM (PROM) In supine  Right 02/24/2023 AROM in supine Right 03/01/2023 AROM in supine  Right 03/27/2023 AROM in supine  Shoulder flexion 50 (90) 100 112 126  Shoulder extension      Shoulder abduction (62)   105  Shoulder adduction      Shoulder internal rotation To belly     Shoulder external rotation NT     Elbow flexion      Elbow extension      Wrist flexion      Wrist extension      Wrist ulnar deviation      Wrist radial deviation      Wrist pronation      Wrist supination      (Blank rows = not tested)  UPPER EXTREMITY MMT:  MMT Right 02/16/2023 Left 02/16/2023 Right 03/27/2023  Shoulder flexion NT 5/5 4/5 in available range  Shoulder extension     Shoulder abduction NT 5/5 4/5 in available range  Shoulder adduction     Shoulder internal rotation NT 5/5   Shoulder external rotation NT 5/5 4/5  Middle trapezius     Lower trapezius  Elbow flexion  5/5   Elbow extension  5/5   Wrist flexion      Wrist extension     Wrist ulnar deviation     Wrist radial deviation     Wrist pronation     Wrist supination     Grip strength (lbs)     (Blank rows = not tested)  SHOULDER SPECIAL TESTS: 02/16/2023 No tested today  JOINT MOBILITY TESTING:  02/16/2023 No testing today  PALPATION:  02/16/2023 General tenderness to light touch around shoulder joint.                                                                                                                                                                                                  TODAY'S TREATMENT:                                                                                  DATE: 03/29/2023 Therex: UBE fwd/back 3 mins each way lvl 2.5 Sidelying Rt shoulder abduction 1# 2 x 15 Sidelying Rt shoulder ER  1# 2 x 15 c towel under arm Standing green band rows 2 x 15 bilateral Standing green band gh ext 2 x 15 bilateral Standing ER red band 2X10 Standing IR red band 2X10 Rolling pball up wall into max flexion 5 sec hold X10  DATE: 03/27/2023 Therex: Sidelying Rt shoulder abduction 2 x 15 Sidelying Rt shoulder ER 2 x 15 c towel under arm Standing green band rows 2 x 15 bilateral Standing green band gh ext 2 x 15 bilateral UBE fwd/back 4 mins each way lvl 2.5  Wall slides flexion 2-3 sec hold x 10  Discussed new HEP and cues for continued progression at home.     TODAY'S TREATMENT:                                                                                 DATE: 03/17/23 Pt seen for aquatic therapy today.  Treatment took place in water 3.5-4.75 ft in depth at the [facility] pool. Temp of water was 91.  Pt entered/exited the pool via stairs  Pt requires the buoyancy and hydrostatic pressure of water for support, and to offload joints by unweighting joint load by at least 50 % in navel deep water and by at least 75-80% in chest to neck deep water.  Viscosity of the water is needed for resistance of  strengthening. Water current perturbations provides challenge to standing balance requiring increased core activation.  exercises Walking forward 4 round trips holding water dumbells at 90 deg Walking backward 4 round trips holding water dumbells at 90 deg Walking sideways 4 round trips holding water dumbells at 90 deg Standing L stretch for shoulder flexion and lumbar flexion 10 sec X10 Shoulder adduction with mini squat using dumbells 2X15 Shoulder horizontal abd/add with dumbells 2X15 Shoulder flexion/extension with rainbow dumbells 2X15 Shoulder cirlces with dumbell while holding squat 2X15 CW and CCW Push/Pull using kickboard while holding lunge 2X15 Hip abd/add swings X 15 bilat Hip flexion/extension swings X 15 bilat Standing L stretch again for shoulder/lumbar flexion 10 sec X 10 holding onto pool rail for increased stretch due to incline of rail  TODAY'S TREATMENT:                                                                                 DATE: 03/15/2023 Therex: UBE Lvl 2.5 ,  3 mins fwd/back each way with 30 sec rest breaks.  Pulleys flexion , scaption with outstretched arm and eccentric lowering focus  Machine rows 2 x 15 10 lbs  Red band ER walk out holds isometric reactive 5 sec hold x 15 with red band Seated wand AAROM flexion against gravity x 10  Additional time for slow movement control focus and rest after activity to recover.     TODAY'S TREATMENT:                                                                                        DATE:  03/07/2023 Therex: UBE Lvl  3,  3 mins fwd/back each way  Standing pball roll ups wall for shoulder flexion and lumbar extension 5 sec X 10 Seated lumbar flexion stretch with pball 5 sec X 10 Supine AAROM flexion, abd, ER, all 2 x 10 to tolerance  Pulley flexion, scaption 2 mins each way Tband rows green 2 x 15  Manual Rt shoulder passive range in flexion, scaption, abduction to tolerance.    PATIENT  EDUCATION: 02/24/2023 Education details: HEP update Person educated: Patient Education method: Programmer, multimedia, Demonstration, Verbal cues, and Handouts Education comprehension: verbalized understanding, returned demonstration, and verbal cues required  HOME EXERCISE PROGRAM: Access Code: [email or web address] URL: [email or web address] Date: 02/24/2023 Prepared by: Chyrel Masson  Exercises - Seated Scapular Retraction  - 3-5 x daily - 7  x weekly - 1 sets - 10 reps - 3-5 hold - Standing Horizontal Shoulder Pendulum Supported with Arm Bent  - 2-3 x daily - 7 x weekly - 1-2 sets - 10 reps - Standing Flexion Extension Shoulder Pendulum Supported with Arm Bent  - 2-3 x daily - 7 x weekly - 1-2 sets - 10 reps - Supine Shoulder Flexion Extension AAROM with Dowel  - 2-3 x daily - 7 x weekly - 1-2 sets - 10-15 reps - 3 hold - Standing Isometric Shoulder Flexion with Doorway - Arm Bent  - 1-2 x daily - 7 x weekly - 1 sets - 10 reps - 5-10 hold - Standing Isometric Shoulder Abduction with Doorway - Arm Bent (Mirrored)  - 1-2 x daily - 7 x weekly - 1 sets - 10 reps - 5-10 hold  ASSESSMENT:  CLINICAL IMPRESSION: She Is progressing well and is beyond 6 weeks post op so did add in light IR strengthening into her program and she had good tolerance to this without any adverse effects.   OBJECTIVE IMPAIRMENTS: decreased activity tolerance, decreased coordination, decreased endurance, decreased mobility, decreased ROM, decreased strength, hypomobility, increased edema, increased fascial restrictions, impaired perceived functional ability, increased muscle spasms, impaired flexibility, impaired UE functional use, improper body mechanics, and pain.   ACTIVITY LIMITATIONS: carrying, lifting, bending, sleeping, bed mobility, bathing, toileting, dressing, self feeding, reach over head, and hygiene/grooming  PARTICIPATION LIMITATIONS: meal prep, cleaning, laundry, interpersonal relationship, driving, shopping,  and community activity  PERSONAL FACTORS:  No specific factors  are also affecting patient's functional outcome.   REHAB POTENTIAL: Good  CLINICAL DECISION MAKING: Stable/uncomplicated  EVALUATION COMPLEXITY: Low   GOALS: Goals reviewed with patient? Yes  SHORT TERM GOALS: (target date for Short term goals are 3 weeks 03/09/2023)  1.Patient will demonstrate independent use of home exercise program to maintain progress from in clinic treatments. Goal status: on going 02/24/2023  LONG TERM GOALS: (target dates for all long term goals are 10 weeks  04/27/2023 )   1. Patient will demonstrate/report pain at worst less than or equal to 2/10 to facilitate minimal limitation in daily activity secondary to pain symptoms. Goal status: met as of week of 03/29/23   2. Patient will demonstrate independent use of home exercise program to facilitate ability to maintain/progress functional gains from skilled physical therapy services. Goal status: updated 02/24/23   3. Patient will demonstrate FOTO outcome > or = 61 % to indicate reduced disability due to condition. Goal status: ongoing 02/24/23   4.  Patient will demonstrate Rt UE MMT 4/5 throughout to facilitate lifting, reaching, carrying at [facility] in daily activity.   Goal status: New   5.  Patient will demonstrate Rt GH joint AROM WFL s symptoms to facilitate usual overhead reaching, self care, dressing at PLOF.    Goal status: New   6.  Patient will demonstrate/report ability to perform work at [employer].  Goal status: New    PLAN:  PT FREQUENCY: 1-3x/week  PT DURATION: 10 weeks  PLANNED INTERVENTIONS: Aquatic PT Therapeutic exercises, Therapeutic activity, Neuro Muscular re-education, Balance training, Gait training, Patient/Family education, Joint mobilization, Stair training, DME instructions, Dry Needling, Electrical stimulation, Traction, Cryotherapy, vasopneumatic device Moist heat, Taping, Ultrasound, Ionotophoresis 4mg /ml  Dexamethasone, and aquatic therapy ,Manual therapy.  All included unless contraindicated  PLAN FOR NEXT SESSION: strength and ROM as tolerated, no longer precautions as beyond 6 weeks post op  Ivery Quale, PT, DPT 03/29/23 9:16 AM

## 2023-04-05 ENCOUNTER — Other Ambulatory Visit: Payer: BC Managed Care – PPO

## 2023-04-05 ENCOUNTER — Ambulatory Visit: Payer: BC Managed Care – PPO | Admitting: Hematology

## 2023-04-05 ENCOUNTER — Encounter: Payer: Self-pay | Admitting: Rehabilitative and Restorative Service Providers"

## 2023-04-05 ENCOUNTER — Ambulatory Visit (INDEPENDENT_AMBULATORY_CARE_PROVIDER_SITE_OTHER): Payer: BC Managed Care – PPO | Admitting: Rehabilitative and Restorative Service Providers"

## 2023-04-05 DIAGNOSIS — R600 Localized edema: Secondary | ICD-10-CM

## 2023-04-05 DIAGNOSIS — G8929 Other chronic pain: Secondary | ICD-10-CM

## 2023-04-05 DIAGNOSIS — M25511 Pain in right shoulder: Secondary | ICD-10-CM | POA: Diagnosis not present

## 2023-04-05 DIAGNOSIS — M6281 Muscle weakness (generalized): Secondary | ICD-10-CM

## 2023-04-05 DIAGNOSIS — R293 Abnormal posture: Secondary | ICD-10-CM

## 2023-04-05 NOTE — Therapy (Signed)
OUTPATIENT PHYSICAL THERAPY TREATMENT / DISCHARGE  Patient Name: Hannah [name] MRN: [id number] DOB:02-26-1961, 62 y.o., female Today's Date: 04/05/2023  PHYSICAL THERAPY DISCHARGE SUMMARY  Visits from Start of Care: 11  Current functional level related to goals / functional outcomes: See note   Remaining deficits: See note   Education / Equipment: HEP  Patient goals were met. Patient is being discharged due to being pleased with the current functional level.    END OF SESSION:  PT End of Session - 04/05/23 0824     Visit Number 11    Number of Visits 20    Date for PT Re-Evaluation 04/27/23    Authorization Type [employer] 20% coinsurance    Progress Note Due on Visit 19    PT Start Time 0810    PT Stop Time 0828    PT Time Calculation (min) 18 min    Activity Tolerance Patient tolerated treatment well    Behavior During Therapy [facility] for tasks assessed/performed               Past Medical History:  Diagnosis Date   Allergy    Anemia    Anxiety    Arthritis    Asthma    Cancer (HCC)    multiple myeloma   Depression    Hypertension    Past Surgical History:  Procedure Laterality Date   ABDOMINAL HYSTERECTOMY     BICEPT TENODESIS Right 01/31/2023   Procedure: BICEPS TENODESIS;  Surgeon: Cammy Copa, MD;  Location: [facility] OR;  Service: Orthopedics;  Laterality: Right;   CHOLECYSTECTOMY  1993   COLONOSCOPY     HERNIA REPAIR     REVERSE SHOULDER ARTHROPLASTY Right 01/31/2023   Procedure: REVERSE SHOULDER ARTHROPLASTY;  Surgeon: Cammy Copa, MD;  Location: [facility] OR;  Service: Orthopedics;  Laterality: Right;   TOTAL KNEE ARTHROPLASTY Right 01/28/2016   TOTAL KNEE ARTHROPLASTY Right 01/28/2016   Procedure: RIGHT TOTAL KNEE ARTHROPLASTY;  Surgeon: Tarry Kos, MD;  Location: [facility] OR;  Service: Orthopedics;  Laterality: Right;   TRIGGER FINGER RELEASE Right 06/18/2014   Procedure: RIGHT LONG FINGER TRIGGER RELEASE;  Surgeon: Cheral Almas, MD;   Location: [facility];  Service: Orthopedics;  Laterality: Right;   TUBAL LIGATION     VAGINAL HYSTERECTOMY Bilateral 11/04/2016   Procedure: HYSTERECTOMY VAGINAL with Bilateral Salpingectomy;  Surgeon: Hal Morales, MD;  Location: [facility] ORS;  Service: Gynecology;  Laterality: Bilateral;   WEIL OSTEOTOMY Right 07/07/2020   Procedure: RIGHT FOOT WEIL OSTEOTOMY 2, 3, AND 4 METATARSALS AND PROXIMAL INTERPHALANGEAL JOINT FUSION 2 & 4 TOES;  Surgeon: Nadara Mustard, MD;  Location: [facility];  Service: Orthopedics;  Laterality: Right;   Patient Active Problem List   Diagnosis Date Noted   Biceps tendonitis on right 02/11/2023   S/P reverse total shoulder arthroplasty, right 01/31/2023   Multiple myeloma in remission (HCC) 07/13/2020   Claw toe, right    Trigger finger, left middle finger 04/09/2020   Stenosing tenosynovitis of finger of left hand 02/27/2020   Hemoglobin C trait (HCC) 02/27/2018   Major depression, recurrent (HCC) 01/12/2018   Chronic right shoulder pain 12/20/2017   MDD (major depressive disorder), recurrent episode, moderate (HCC) 11/04/2017   Primary osteoarthritis of both hands 09/26/2017   Primary osteoarthritis of right shoulder 09/26/2017   Primary osteoarthritis of both hips 09/26/2017   Status post total knee replacement, right 09/26/2017   Primary osteoarthritis of both feet 09/26/2017  History of asthma 09/26/2017   Primary osteoarthritis of left knee 04/18/2017   Menorrhagia 11/04/2016   Fibroid tumor 11/04/2016   Adenomyosis 11/04/2016   Hypertension, essential 11/04/2016   Total knee replacement status 01/28/2016    PCP: Gaspar Garbe MD  REFERRING PROVIDER: Julieanne Cotton, PA-C  REFERRING DIAG: [phone] (ICD-10-CM) - S/P reverse total shoulder arthroplasty, right  THERAPY DIAG:  Chronic right shoulder pain  Muscle weakness (generalized)  Localized edema  Abnormal posture  Rationale for Evaluation and  Treatment: Rehabilitation  ONSET DATE: surgery 01/31/2023  SUBJECTIVE:                                                                                                                                                                                      SUBJECTIVE STATEMENT: Pt states her shoulder is feeling a lot better overall   PERTINENT HISTORY: PMH anxiety CA myeloma depression HTN Rt TKA   PAIN:  NPRS scale:1/10 Pain location: Rt shoulder, general shoulder  Pain description: achy Aggravating factors: arm movements, arm handing out of sling.  Relieving factors: pain med, OTC medicine  PRECAUTIONS: Shoulder - "Do not want to externally rotate past 30 degrees to protect subscapularis repair " No longer needed after 03/29/23  WEIGHT BEARING RESTRICTIONS: Yes Rt shoulder  FALLS:  Has patient fallen in last 6 months? No  LIVING ENVIRONMENT: Lives in: House/apartment   OCCUPATION: Work from home on computer.   PLOF: Independent, light gardening. Walking for exercise.   PATIENT GOALS: Reduce pain, get arm back to ROM/use.   OBJECTIVE:   PATIENT SURVEYS:  04/05/2023 FOTO update: 68  03/15/2023 FOTO update:  54  02/16/2023 FOTO intake:  29   predicted:  61  COGNITION: 02/16/2023 Overall cognitive status: WFL     SENSATION: 02/16/2023 WFL  POSTURE: 02/16/2023 No specific.   UPPER EXTREMITY ROM:   ROM Right 02/16/2023 AROM (PROM) In supine  Right 02/24/2023 AROM in supine Right 03/01/2023 AROM in supine  Right 03/27/2023 AROM in supine  Shoulder flexion 50 (90) 100 112 126  Shoulder extension      Shoulder abduction (62)   105  Shoulder adduction      Shoulder internal rotation To belly     Shoulder external rotation NT     Elbow flexion      Elbow extension      Wrist flexion      Wrist extension      Wrist ulnar deviation      Wrist radial deviation      Wrist pronation      Wrist supination      (Blank rows = not tested)  UPPER  EXTREMITY  MMT:  MMT Right 02/16/2023 Left 02/16/2023 Right 03/27/2023 Right 04/05/2023  Shoulder flexion NT 5/5 4/5 in available range 4+/5  Shoulder extension      Shoulder abduction NT 5/5 4/5 in available range 4+/5  Shoulder adduction      Shoulder internal rotation NT 5/5    Shoulder external rotation NT 5/5 4/5 4+/5  Middle trapezius      Lower trapezius      Elbow flexion  5/5    Elbow extension  5/5    Wrist flexion      Wrist extension      Wrist ulnar deviation      Wrist radial deviation      Wrist pronation      Wrist supination      Grip strength (lbs)      (Blank rows = not tested)  SHOULDER SPECIAL TESTS: 02/16/2023 No tested today  JOINT MOBILITY TESTING:  02/16/2023 No testing today  PALPATION:  02/16/2023 General tenderness to light touch around shoulder joint.                                                                                                                                                                                                  TODAY'S TREATMENT:                                                                                 DATE: 04/05/2023 Therex: HEP review verbally with updated printout provided. Green band ER with pillow under arm at side Rt shoulder x 10 Green band ER walk outs Rt shoulder 5 sec hold x 10   TODAY'S TREATMENT:                                                                                 DATE: 03/29/2023 Therex: UBE fwd/back 3 mins each way lvl 2.5 Sidelying Rt shoulder abduction 1# 2 x 15 Sidelying Rt shoulder ER  1# 2 x 15  c towel under arm Standing green band rows 2 x 15 bilateral Standing green band gh ext 2 x 15 bilateral Standing ER red band 2X10 Standing IR red band 2X10 Rolling pball up wall into max flexion 5 sec hold X10  TODAY'S TREATMENT:                                                                                 DATE:  03/27/2023 Therex: Sidelying Rt shoulder abduction 2 x 15 Sidelying Rt  shoulder ER 2 x 15 c towel under arm Standing green band rows 2 x 15 bilateral Standing green band gh ext 2 x 15 bilateral UBE fwd/back 4 mins each way lvl 2.5  Wall slides flexion 2-3 sec hold x 10  Discussed new HEP and cues for continued progression at home.     TODAY'S TREATMENT:                                                                                 DATE: 03/17/23 Pt seen for aquatic therapy today.  Treatment took place in water 3.5-4.75 ft in depth at the [facility] pool. Temp of water was 91.  Pt entered/exited the pool via stairs  Pt requires the buoyancy and hydrostatic pressure of water for support, and to offload joints by unweighting joint load by at least 50 % in navel deep water and by at least 75-80% in chest to neck deep water.  Viscosity of the water is needed for resistance of strengthening. Water current perturbations provides challenge to standing balance requiring increased core activation.  exercises Walking forward 4 round trips holding water dumbells at 90 deg Walking backward 4 round trips holding water dumbells at 90 deg Walking sideways 4 round trips holding water dumbells at 90 deg Standing L stretch for shoulder flexion and lumbar flexion 10 sec X10 Shoulder adduction with mini squat using dumbells 2X15 Shoulder horizontal abd/add with dumbells 2X15 Shoulder flexion/extension with rainbow dumbells 2X15 Shoulder cirlces with dumbell while holding squat 2X15 CW and CCW Push/Pull using kickboard while holding lunge 2X15 Hip abd/add swings X 15 bilat Hip flexion/extension swings X 15 bilat Standing L stretch again for shoulder/lumbar flexion 10 sec X 10 holding onto pool rail for increased stretch due to incline of rail  TODAY'S TREATMENT:                                                                                 DATE: 03/15/2023 Therex: UBE Lvl 2.5 ,  3 mins fwd/back each way with 30 sec rest breaks.  Pulleys  flexion , scaption with  outstretched arm and eccentric lowering focus  Machine rows 2 x 15 10 lbs  Red band ER walk out holds isometric reactive 5 sec hold x 15 with red band Seated wand AAROM flexion against gravity x 10  Additional time for slow movement control focus and rest after activity to recover.    PATIENT EDUCATION: 04/05/2023 Education details: HEP update Person educated: Patient Education method: Programmer, multimedia, Demonstration, Verbal cues, and Handouts Education comprehension: verbalized understanding, returned demonstration, and verbal cues required  HOME EXERCISE PROGRAM: Access Code: [email or web address] URL: [email or web address] Date: 04/05/2023 Prepared by: Chyrel Masson  Exercises - Seated Scapular Retraction  - 3-5 x daily - 7 x weekly - 1 sets - 10 reps - 3-5 hold - Standing shoulder flexion wall slides  - 1 x daily - 7 x weekly - 1 sets - 5-10 reps - 5 hold - Supine Shoulder Flexion Extension AAROM with Dowel  - 2-3 x daily - 7 x weekly - 1-2 sets - 10-15 reps - 3 hold - Sidelying Shoulder Abduction Palm Forward  - 1-2 x daily - 7 x weekly - 2-3 sets - 10-15 reps - Sidelying Shoulder External Rotation (Mirrored)  - 1-2 x daily - 7 x weekly - 2-3 sets - 10-15 reps - Standing Bilateral Low Shoulder Row with Anchored Resistance  - 1 x daily - 7 x weekly - 2-3 sets - 10-15 reps - Shoulder Extension with Resistance  - 1 x daily - 7 x weekly - 1-2 sets - 10-15 reps - Shoulder External Rotation with Anchored Resistance  - 1 x daily - 7 x weekly - 3 sets - 10 reps - Shoulder Internal Rotation with Resistance  - 1 x daily - 7 x weekly - 3 sets - 10 reps - Shoulder External Rotation Reactive Isometrics  - 1 x daily - 7 x weekly - 1 sets - 10 reps - 5 hold  ASSESSMENT:  CLINICAL IMPRESSION: Due to improvements and progress related to shoulder, Pt was recommended (Pt in agreement) for transition to HEP and discharge from skilled PT related to shoulder.  See objective data for updated  information.  Pt to have PT [facility] related [facility]  Decreased treatment due to arrival time and discharge planning/measurements today.   OBJECTIVE IMPAIRMENTS: decreased activity tolerance, decreased coordination, decreased endurance, decreased mobility, decreased ROM, decreased strength, hypomobility, increased edema, increased fascial restrictions, impaired perceived functional ability, increased muscle spasms, impaired flexibility, impaired UE functional use, improper body mechanics, and pain.   ACTIVITY LIMITATIONS: carrying, lifting, bending, sleeping, bed mobility, bathing, toileting, dressing, self feeding, reach over head, and hygiene/grooming  PARTICIPATION LIMITATIONS: meal prep, cleaning, laundry, interpersonal relationship, driving, shopping, and community activity  PERSONAL FACTORS:  No specific factors  are also affecting patient's functional outcome.   REHAB POTENTIAL: Good  CLINICAL DECISION MAKING: Stable/uncomplicated  EVALUATION COMPLEXITY: Low   GOALS: Goals reviewed with patient? Yes  SHORT TERM GOALS: (target date for Short term goals are 3 weeks 03/09/2023)  1.Patient will demonstrate independent use of home exercise program to maintain progress from in clinic treatments. Goal status: Met  LONG TERM GOALS: (target dates for all long term goals are 10 weeks  04/27/2023 )   1. Patient will demonstrate/report pain at worst less than or equal to 2/10 to facilitate minimal limitation in daily activity secondary to pain symptoms. Goal status: met as of week of 03/29/23   2. Patient will demonstrate independent  use of home exercise program to facilitate ability to maintain/progress functional gains from skilled physical therapy services. Goal status: Met 04/05/2023   3. Patient will demonstrate FOTO outcome > or = 61 % to indicate reduced disability due to condition. Goal status: Met 04/05/2023   4.  Patient will demonstrate  Rt UE MMT 4/5 throughout to facilitate lifting, reaching, carrying at [facility] in daily activity.   Goal status:  Met 04/05/2023   5.  Patient will demonstrate Rt GH joint AROM WFL s symptoms to facilitate usual overhead reaching, self care, dressing at PLOF.    Goal status:  mostly Met 04/05/2023   6.  Patient will demonstrate/report ability to perform work at [employer].  Goal status:  Met 04/05/2023    PLAN:  PT FREQUENCY: 1-3x/week  PT DURATION: 10 weeks  PLANNED INTERVENTIONS: Aquatic PT Therapeutic exercises, Therapeutic activity, Neuro Muscular re-education, Balance training, Gait training, Patient/Family education, Joint mobilization, Stair training, DME instructions, Dry Needling, Electrical stimulation, Traction, Cryotherapy, vasopneumatic device Moist heat, Taping, Ultrasound, Ionotophoresis 4mg /ml Dexamethasone, and aquatic therapy ,Manual therapy.  All included unless contraindicated  PLAN FOR NEXT SESSION: Discharge to HEP.    Chyrel Masson, PT, DPT, OCS, ATC 04/05/23  8:34 AM

## 2023-04-08 ENCOUNTER — Ambulatory Visit
Admission: RE | Admit: 2023-04-08 | Discharge: 2023-04-08 | Disposition: A | Discharge status: Home / Self Care | Payer: BC Managed Care – PPO | Source: Ambulatory Visit | Attending: Orthopedic Surgery | Admitting: Orthopedic Surgery

## 2023-04-08 DIAGNOSIS — Z8579 Personal history of other malignant neoplasms of lymphoid, hematopoietic and related tissues: Secondary | ICD-10-CM

## 2023-04-10 ENCOUNTER — Other Ambulatory Visit: Payer: Self-pay | Admitting: Nurse Practitioner

## 2023-04-10 MED ORDER — LENALIDOMIDE 10 MG PO CAPS: 10.0000 mg | ORAL_CAPSULE | Freq: Every day | ORAL | 0 refills | Status: DC

## 2023-04-14 ENCOUNTER — Other Ambulatory Visit: Payer: Self-pay | Admitting: Hematology

## 2023-04-14 DIAGNOSIS — Z1231 Encounter for screening mammogram for malignant neoplasm of breast: Secondary | ICD-10-CM | POA: Diagnosis not present

## 2023-04-14 DIAGNOSIS — Z01419 Encounter for gynecological examination (general) (routine) without abnormal findings: Secondary | ICD-10-CM | POA: Diagnosis not present

## 2023-04-14 MED ORDER — LENALIDOMIDE 10 MG PO CAPS: 10.0000 mg | ORAL_CAPSULE | Freq: Every day | ORAL | 0 refills | Status: DC

## 2023-04-19 ENCOUNTER — Telehealth (HOSPITAL_BASED_OUTPATIENT_CLINIC_OR_DEPARTMENT_OTHER): Payer: Self-pay | Admitting: Physical Therapy

## 2023-04-19 ENCOUNTER — Ambulatory Visit (HOSPITAL_BASED_OUTPATIENT_CLINIC_OR_DEPARTMENT_OTHER): Payer: BC Managed Care – PPO | Admitting: Physical Therapy

## 2023-04-19 ENCOUNTER — Other Ambulatory Visit: Payer: BC Managed Care – PPO

## 2023-04-19 ENCOUNTER — Encounter (HOSPITAL_BASED_OUTPATIENT_CLINIC_OR_DEPARTMENT_OTHER): Payer: Self-pay

## 2023-04-19 ENCOUNTER — Telehealth: Payer: Self-pay | Admitting: Orthopedic Surgery

## 2023-04-19 DIAGNOSIS — M545 Low back pain, unspecified: Secondary | ICD-10-CM

## 2023-04-19 NOTE — Telephone Encounter (Signed)
New PT referral placed in chart

## 2023-04-19 NOTE — Telephone Encounter (Signed)
She can do PT for her hip that should be fine.  Should be able to transition to HEP for her shoulder at this point and focus on the hip if she is having continued pain.  We can go over her MRI results at her appointment with Dr [name] on 11/27

## 2023-04-19 NOTE — Telephone Encounter (Signed)
Patient called and said that [facility] has a duplicate referral for her shoulder and its suppose to be the hip. [id number]

## 2023-04-19 NOTE — Telephone Encounter (Signed)
Its PT. The PT referral is currently for her shoulder but she is saying she wants to do it for her hip?

## 2023-04-19 NOTE — Telephone Encounter (Signed)
Called regarding bad ref. for appt tomorrow. Call was answered and immediately hung up. Tried calling again and was sent straight to VM. Left VM requesting pt call back ASAP, as we won't be able to see her if we do not have the correct referral. -MB

## 2023-04-19 NOTE — Telephone Encounter (Signed)
What is the referral in regards to?  I saw she had recent MRI scans but if she talking about physical therapy or something else?

## 2023-04-19 NOTE — Telephone Encounter (Signed)
Lvm for pt to cb to discuss  

## 2023-04-19 NOTE — Telephone Encounter (Signed)
Called patient regarding OP rehab evaluation appt on 11.20 @ 8:15 am. The referral attached has the wrong diagnosis and needs to be corrected before we can see her. Pt did not answer, so I left a VM about this and requested the pt call us back ASAP. -MB

## 2023-04-19 NOTE — Addendum Note (Signed)
Addended by: Barbette Or on: 04/19/2023 04:23 PM   Modules accepted: Orders

## 2023-04-26 ENCOUNTER — Encounter: Payer: BC Managed Care – PPO | Admitting: Orthopedic Surgery

## 2023-05-03 ENCOUNTER — Inpatient Hospital Stay (HOSPITAL_BASED_OUTPATIENT_CLINIC_OR_DEPARTMENT_OTHER): Payer: BC Managed Care – PPO | Admitting: Hematology

## 2023-05-03 ENCOUNTER — Encounter: Payer: Self-pay | Admitting: Hematology

## 2023-05-03 ENCOUNTER — Inpatient Hospital Stay: Payer: BC Managed Care – PPO | Attending: Hematology

## 2023-05-03 VITALS — BP 146/87 | HR 90 | Temp 98.0°F | Resp 16 | Wt 221.9 lb

## 2023-05-03 DIAGNOSIS — Z7961 Long term (current) use of immunomodulator: Secondary | ICD-10-CM | POA: Diagnosis not present

## 2023-05-03 DIAGNOSIS — Z9079 Acquired absence of other genital organ(s): Secondary | ICD-10-CM | POA: Insufficient documentation

## 2023-05-03 DIAGNOSIS — Z96619 Presence of unspecified artificial shoulder joint: Secondary | ICD-10-CM | POA: Diagnosis not present

## 2023-05-03 DIAGNOSIS — M549 Dorsalgia, unspecified: Secondary | ICD-10-CM | POA: Diagnosis not present

## 2023-05-03 DIAGNOSIS — Z9049 Acquired absence of other specified parts of digestive tract: Secondary | ICD-10-CM | POA: Insufficient documentation

## 2023-05-03 DIAGNOSIS — I10 Essential (primary) hypertension: Secondary | ICD-10-CM | POA: Insufficient documentation

## 2023-05-03 DIAGNOSIS — Z79899 Other long term (current) drug therapy: Secondary | ICD-10-CM | POA: Diagnosis not present

## 2023-05-03 DIAGNOSIS — C9000 Multiple myeloma not having achieved remission: Secondary | ICD-10-CM

## 2023-05-03 DIAGNOSIS — R197 Diarrhea, unspecified: Secondary | ICD-10-CM | POA: Diagnosis not present

## 2023-05-03 DIAGNOSIS — C9001 Multiple myeloma in remission: Secondary | ICD-10-CM | POA: Insufficient documentation

## 2023-05-03 DIAGNOSIS — Z882 Allergy status to sulfonamides status: Secondary | ICD-10-CM | POA: Diagnosis not present

## 2023-05-03 DIAGNOSIS — Z9484 Stem cells transplant status: Secondary | ICD-10-CM | POA: Insufficient documentation

## 2023-05-03 DIAGNOSIS — D509 Iron deficiency anemia, unspecified: Secondary | ICD-10-CM

## 2023-05-03 LAB — CMP (CANCER CENTER ONLY)
ALT: 10 U/L (ref 0–44)
AST: 12 U/L — ABNORMAL LOW (ref 15–41)
Albumin: 4 g/dL (ref 3.5–5.0)
Alkaline Phosphatase: 78 U/L (ref 38–126)
Anion gap: 5 (ref 5–15)
BUN: 9 mg/dL (ref 6–20)
CO2: 28 mmol/L (ref 22–32)
Calcium: 9.1 mg/dL (ref 8.9–10.3)
Chloride: 108 mmol/L (ref 98–111)
Creatinine: 0.86 mg/dL (ref 0.44–1.00)
GFR, Estimated: >60 mL/min (ref >60)
Glucose, Bld: 112 mg/dL — ABNORMAL HIGH (ref 70–99)
Potassium: 4.1 mmol/L (ref 3.5–5.1)
Sodium: 141 mmol/L (ref 135–145)
Total Bilirubin: 0.5 mg/dL (ref <1.2)
Total Protein: 7.3 g/dL (ref 6.5–8.1)

## 2023-05-03 LAB — CBC WITH DIFFERENTIAL (CANCER CENTER ONLY)
Abs Immature Granulocytes: 0.02 10*3/uL (ref 0.00–0.07)
Basophils Absolute: 0 10*3/uL (ref 0.0–0.1)
Basophils Relative: 0 %
Eosinophils Absolute: 0.2 10*3/uL (ref 0.0–0.5)
Eosinophils Relative: 4 %
HCT: 37.9 % (ref 36.0–46.0)
Hemoglobin: 12.1 g/dL (ref 12.0–15.0)
Immature Granulocytes: 0 %
Lymphocytes Relative: 34 %
Lymphs Abs: 2.2 10*3/uL (ref 0.7–4.0)
MCH: 23.8 pg — ABNORMAL LOW (ref 26.0–34.0)
MCHC: 31.9 g/dL (ref 30.0–36.0)
MCV: 74.5 fL — ABNORMAL LOW (ref 80.0–100.0)
Monocytes Absolute: 0.3 10*3/uL (ref 0.1–1.0)
Monocytes Relative: 5 %
Neutro Abs: 3.7 10*3/uL (ref 1.7–7.7)
Neutrophils Relative %: 57 %
Platelet Count: 181 10*3/uL (ref 150–400)
RBC: 5.09 MIL/uL (ref 3.87–5.11)
RDW: 16 % — ABNORMAL HIGH (ref 11.5–15.5)
WBC Count: 6.4 10*3/uL (ref 4.0–10.5)
nRBC: 0 % (ref 0.0–0.2)

## 2023-05-03 LAB — IRON AND IRON BINDING CAPACITY (CC-WL,HP ONLY)
Iron: 71 ug/dL (ref 28–170)
Saturation Ratios: 26 % (ref 10.4–31.8)
TIBC: 277 ug/dL (ref 250–450)
UIBC: 206 ug/dL (ref 148–442)

## 2023-05-03 NOTE — Progress Notes (Signed)
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   -bone marrow biopsy on 07/30/20 showed slightly hypercellular marrow for age and 31% plasma cells. Cytogenetics and FISH were unremarkable -she received VRD (weekly Velcade and dexamethasone, Revlimid 2 weeks on 1 week off) 08/17/20 - June 2022. She responded well. -She underwent high-dose chemo with melphalan and autologous stem cell transplant at [facility] 12/23/20 by Dr. Rosaria Ferries.  White cells engrafted 01/03/21 and platelets 01/13/21, last platelet transfusion 01/02/21 -posttreatment PET 04/06/21 showed NED  -posttreatment bone marrow biopsy 04/29/21 showed 10-20% hypocellular marrow with myeloid hypoplasia and 3% plasma cells -s/p prophylactic valacyclovir for 1 year and dapsone for 6 months posttransplant -she will continue revaccination per [facility] protocol -she began maintenance  Revlimid 10 mg daily days 1-21 q. 28 days on 05/03/21. She is tolerating well with no noticeable side effects. will continue indefinitely or until disease progression. -Her last MM panel showed negative M-protein, but slightly increase Kappa light chain level in 02/2023, overall  stable  -Revlimid was briefly held around her shoulder replacement in 27 February 2023.  She has restarted.   Assessment and Plan    Multiple Myeloma Multiple myeloma in remission.  I reviewed her pelvic and lumbar MRI from October 2024, which showed no new bone lesions. Previously treated pelvic lesion is well-managed. Normal kidney and liver function tests. Negative M protein, normal IgA level, stable light chain levels. Complete eradication is challenging; [name] prefers to continue current treatment and follow-up schedule. - Continue current medication regimen - Monitor for new bone pain - Schedule follow-up every three months  Back Pain Intermittent back pain likely due to mild nerve impingement at L5-S1 and possible arthritis. Awaiting physical therapy referral. Discussed benefits of physical therapy. - Follow up with physical therapy referral - Monitor symptoms and report changes  Diarrhea Frequent loose bowel movements during Hannah week off Revlimid and towards Hannah end of Hannah medication cycle. No associated pain. Potential dehydration risk. Discussed use of Imodium. - Use Imodium as needed - Monitor frequency and severity of symptoms  General Health Maintenance Up to date on vaccines except for Hannah latest COVID-19 booster. - Administer latest COVID-19 booster  Plan -Continue maintenance Revlimid -She will call [facility] to confirm next appointment - Schedule follow-up visit in three months with lab.        SUMMARY OF ONCOLOGIC HISTORY: Oncology History Overview Note  Cancer Staging Multiple myeloma (HCC) Staging form: Plasma Cell Myeloma and Plasma Cell Disorders, AJCC 8th Edition -  Clinical  stage from 07/20/2020: Beta-2-microglobulin (mg/L): 2.4, Albumin (g/dL): 3.2, ISS: Stage II, High-risk cytogenetics: Unknown, LDH: Normal - Signed by Malachy Mood, MD on 08/02/2020 Beta 2 microglobulin range (mg/L): Less than 3.5 Albumin range (g/dL): Less than 3.5 Cytogenetics: Unknown    Multiple myeloma in remission (HCC)  07/03/2020 Imaging   MRI Lumbar Spine  IMPRESSION: 1. Numerous T1 hypointense and STIR hyperintense lesions throughout Hannah visualized spine and sacrum with multiple areas of extraosseous extension in Hannah sacrum, detailed above and compatible with multiple myeloma given Hannah clinical history. 2. An MRI of Hannah pelvis with contrast could evaluate Hannah full extent of Hannah partially imaged sacral lesions, including left S1-S2 neural foraminal involvement and suspected involvement of Hannah exited left L5 nerve. 3. Multilevel degenerative change without significant canal or foraminal stenosis in Hannah lumbar spine.   07/13/2020 Initial Diagnosis   Multiple myeloma (HCC)   07/20/2020 Cancer Staging   Staging form: Plasma Cell Myeloma and Plasma Cell Disorders, AJCC 8th Edition - Clinical stage from 07/20/2020: Beta-2-microglobulin (mg/L): 2.4, Albumin (g/dL): 3.2, ISS: Stage II, High-risk cytogenetics: Unknown, LDH: Normal - Signed by Malachy Mood, MD on 03/09/2022 Stage prefix: Initial diagnosis Beta 2 microglobulin range (mg/L): Less than 3.5 Albumin range (g/dL): Less than 3.5 Cytogenetics: No abnormalities Bone disease on imaging: Present   07/24/2020 PET scan   IMPRESSION: 1. Moderate to high hypermetabolic activity associated with Hannah expansile soft tissue masses within Hannah LEFT and RIGHT pelvic bones is consistent with active multiple myeloma / plasmacytoma. 2. Lytic lesion in Hannah LEFT scapula with mild metabolic activity is indeterminate. 3. Metabolic activity associated with Hannah RIGHT knee prosthetic and RIGHT distal foot fusion is favored post procedural inflammation.      07/30/2020 - 08/12/2020 Radiation Therapy   Palliative Radiation to Pelvic lesions with Dr Mitzi Hansen    07/30/2020 Pathology Results   DIAGNOSIS:   BONE MARROW, ASPIRATE, CLOT, CORE:  -Normocellular to slightly hypercellular bone marrow for age with  plasmacytosis  -See comment   PERIPHERAL BLOOD:  -Microcytic-normochromic anemia   COMMENT:   Hannah bone marrow shows increased number of atypical plasma cells  representing 31% of all cells in Hannah aspirate associated with prominent  interstitial infiltrates and numerous variably sized clusters in Hannah  clot and biopsy sections.  Hannah findings are most consistent with  persistent/recurrent/previously known plasma cell neoplasm.  For  completeness, immunohistochemical stain for CD138 and in situ  hybridization for kappa and lambda will be performed and Hannah results  reported in an addendum.  Correlation with cytogenetic and FISH studies  is recommended.   08/03/2020 -  Chemotherapy   Zometa q4weeks starting 08/03/20    08/17/2020 - 11/29/2020 Chemotherapy   Velcade weekly, Revlimid, dex 20mg  weekly (VRd)  Starting 08/17/20. Last dose Velcade 11/23/20 and last dose revlimid on 11/29/20       Discussed Hannah use of AI scribe software for clinical note transcription with Hannah [name], who gave verbal consent to proceed.  History of Present Illness   Hannah [name], a 62 year old female with a history of multiple myeloma, presents for a follow-up visit. She reports that her back pain has improved, although it is still present at times. She recently had a shoulder replacement and is awaiting physical therapy. She has been taking Revlimid for her multiple myeloma and has noticed an increase in bowel movements, particularly when she is off Hannah medication. Hannah [name] reports that these bowel movements are often loose and uncontrollable, leading to concerns about  managing this at work. She also mentions having her gallbladder removed years ago, which may contribute to  her bowel issues.         All other systems were reviewed with Hannah [name] and are negative.  MEDICAL HISTORY:  Past Medical History:  Diagnosis Date   Allergy    Anemia    Anxiety    Arthritis    Asthma    Cancer (HCC)    multiple myeloma   Depression    Hypertension     SURGICAL HISTORY: Past Surgical History:  Procedure Laterality Date   ABDOMINAL HYSTERECTOMY     BICEPT TENODESIS Right 01/31/2023   Procedure: BICEPS TENODESIS;  Surgeon: Cammy Copa, MD;  Location: [facility] [place] OR;  Service: Orthopedics;  Laterality: Right;   CHOLECYSTECTOMY  1993   COLONOSCOPY     HERNIA REPAIR     REVERSE SHOULDER ARTHROPLASTY Right 01/31/2023   Procedure: REVERSE SHOULDER ARTHROPLASTY;  Surgeon: Cammy Copa, MD;  Location: [facility] OR;  Service: Orthopedics;  Laterality: Right;   TOTAL KNEE ARTHROPLASTY Right 01/28/2016   TOTAL KNEE ARTHROPLASTY Right 01/28/2016   Procedure: RIGHT TOTAL KNEE ARTHROPLASTY;  Surgeon: Tarry Kos, MD;  Location: [facility] OR;  Service: Orthopedics;  Laterality: Right;   TRIGGER FINGER RELEASE Right 06/18/2014   Procedure: RIGHT LONG FINGER TRIGGER RELEASE;  Surgeon: Cheral Almas, MD;  Location: [facility];  Service: Orthopedics;  Laterality: Right;   TUBAL LIGATION     VAGINAL HYSTERECTOMY Bilateral 11/04/2016   Procedure: HYSTERECTOMY VAGINAL with Bilateral Salpingectomy;  Surgeon: Hal Morales, MD;  Location: [facility] ORS;  Service: Gynecology;  Laterality: Bilateral;   WEIL OSTEOTOMY Right 07/07/2020   Procedure: RIGHT FOOT WEIL OSTEOTOMY 2, 3, AND 4 METATARSALS AND PROXIMAL INTERPHALANGEAL JOINT FUSION 2 & 4 TOES;  Surgeon: Nadara Mustard, MD;  Location: [facility];  Service: Orthopedics;  Laterality: Right;    I have reviewed Hannah social history and family history with Hannah [name] and they are unchanged from previous note.  ALLERGIES:  is allergic to elemental sulfur and sulfa antibiotics.  MEDICATIONS:  Current  Outpatient Medications  Medication Sig Dispense Refill   aspirin EC 81 MG tablet Take 81 mg by mouth daily. Swallow whole.     calcium carbonate (OSCAL) 1500 (600 Ca) MG TABS tablet Take 600 mg of elemental calcium by mouth daily.     Cholecalciferol (VITAMIN D3) 1.25 MG (50000 UT) CAPS Take 1 capsule by mouth every Sunday.     docusate sodium (COLACE) 100 MG capsule Take 1 capsule (100 mg total) by mouth 2 (two) times daily. 10 capsule 0   ferrous sulfate 325 (65 FE) MG tablet Take 325 mg by mouth 2 (two) times daily with a meal.     lenalidomide (REVLIMID) 10 MG capsule Take 1 capsule (10 mg total) by mouth daily. [employer] Auth # [id number]  Date Obtained 04/14/2023 TAKE 1 CAPSULE BY MOUTH 1 TIME A DAY FOR 21 DAYS ON THEN 7 DAYS OFF for a 28-day cycle 21 capsule 0   methocarbamol (ROBAXIN) 500 MG tablet Take 1 tablet (500 mg total) by mouth every 8 (eight) hours as needed for muscle spasms. 30 tablet 0   naproxen (NAPROSYN) 250 MG tablet Take 1 tablet (250 mg total) by mouth 2 (two) times daily with a meal. 30 tablet 0   oxyCODONE (OXY IR/ROXICODONE) 5 MG immediate release tablet Take 1 tablet (5 mg total) by mouth every 4 (four) hours as  needed for moderate pain (pain score 4-6). 30 tablet 0   sertraline (ZOLOFT) 50 MG tablet Take 50 mg by mouth daily.     valACYclovir (VALTREX) 500 MG tablet Take 500 mg by mouth 2 (two) times daily.     No current facility-administered medications for this visit.    PHYSICAL EXAMINATION: ECOG PERFORMANCE STATUS: 1 - Symptomatic but completely ambulatory  Vitals:   05/03/23 0918  BP: (!) 146/87  Pulse: 90  Resp: 16  Temp: 98 F (36.7 C)  SpO2: 99%   Wt Readings from Last 3 Encounters:  05/03/23 221 lb 14.4 oz (100.7 kg)  03/06/23 226 lb 4.8 oz (102.6 kg)  01/31/23 215 lb (97.5 kg)     GENERAL:alert, no distress and comfortable SKIN: skin color, texture, turgor are normal, no rashes or significant lesions EYES: normal, Conjunctiva are pink and  non-injected, sclera clear NECK: supple, thyroid normal size, non-tender, without nodularity LYMPH:  no palpable lymphadenopathy in Hannah cervical, axillary  LUNGS: clear to auscultation and percussion with normal breathing effort HEART: regular rate & rhythm and no murmurs and no lower extremity edema ABDOMEN:abdomen soft, non-tender and normal bowel sounds Musculoskeletal:no cyanosis of digits and no clubbing  NEURO: alert & oriented x 3 with fluent speech, no focal motor/sensory deficits   LABORATORY DATA:  I have reviewed Hannah data as listed    Latest Ref Rng & Units 05/03/2023    8:17 AM 03/06/2023    8:41 AM 01/18/2023   10:00 AM  CBC  WBC 4.0 - 10.5 K/uL 6.4  5.4  6.1   Hemoglobin 12.0 - 15.0 g/dL 69.6  78.9  38.1   Hematocrit 36.0 - 46.0 % 37.9  31.6  37.2   Platelets 150 - 400 K/uL 181  163  155         Latest Ref Rng & Units 05/03/2023    8:17 AM 03/06/2023    8:41 AM 01/18/2023   10:00 AM  CMP  Glucose 70 - 99 mg/dL 017  91  96   BUN 6 - 20 mg/dL 9  13  10    Creatinine 0.44 - 1.00 mg/dL 5.10  2.58  5.27   Sodium 135 - 145 mmol/L 141  142  139   Potassium 3.5 - 5.1 mmol/L 4.1  3.9  3.7   Chloride 98 - 111 mmol/L 108  107  101   CO2 22 - 32 mmol/L 28  31  29    Calcium 8.9 - 10.3 mg/dL 9.1  8.8  8.9   Total Protein 6.5 - 8.1 g/dL 7.3  6.6    Total Bilirubin <1.2 mg/dL 0.5  0.4    Alkaline Phos 38 - 126 U/L 78  77    AST 15 - 41 U/L 12  14    ALT 0 - 44 U/L 10  10        RADIOGRAPHIC STUDIES: I have personally reviewed Hannah radiological images as listed and agreed with Hannah findings in Hannah report. No results found.    No orders of Hannah defined types were placed in this encounter.  All questions were answered. Hannah [name] knows to call Hannah clinic with any problems, questions or concerns. No barriers to learning was detected. Hannah total time spent in Hannah appointment was 25 minutes.     Malachy Mood, MD 05/03/2023

## 2023-05-03 NOTE — Assessment & Plan Note (Signed)
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   -bone marrow biopsy on 07/30/20 showed slightly hypercellular marrow for age and 31% plasma cells. Cytogenetics and FISH were unremarkable -she received VRD (weekly Velcade and dexamethasone, Revlimid 2 weeks on 1 week off) 08/17/20 - June 2022. She responded well. -She underwent high-dose chemo with melphalan and autologous stem cell transplant at [facility] 12/23/20 by Dr. Rosaria Ferries.  White cells engrafted 01/03/21 and platelets 01/13/21, last platelet transfusion 01/02/21 -posttreatment PET 04/06/21 showed NED  -posttreatment bone marrow biopsy 04/29/21 showed 10-20% hypocellular marrow with myeloid hypoplasia and 3% plasma cells -s/p prophylactic valacyclovir for 1 year and dapsone for 6 months posttransplant -she will continue revaccination per [facility] protocol -she began maintenance Revlimid 10 mg daily days 1-21 q. 28 days on 05/03/21. She is tolerating well with no noticeable side effects. will continue indefinitely or until disease progression. -Her last MM panel showed negative M-protein, but slightly increase Kappa light chain level in 02/2023, overall  stable  -Revlimid was briefly held around her shoulder replacement in 27 February 2023.  She has restarted.

## 2023-05-04 ENCOUNTER — Telehealth: Payer: Self-pay | Admitting: *Deleted

## 2023-05-04 LAB — KAPPA/LAMBDA LIGHT CHAINS
Kappa free light chain: 32.7 mg/L — ABNORMAL HIGH (ref 3.3–19.4)
Kappa, lambda light chain ratio: 1.91 — ABNORMAL HIGH (ref 0.26–1.65)
Lambda free light chains: 17.1 mg/L (ref 5.7–26.3)

## 2023-05-04 NOTE — Progress Notes (Signed)
Hi I left a message on her machine and she will potentially call back if she wants to get set up with injections.  Thanks

## 2023-05-04 NOTE — Telephone Encounter (Addendum)
05/03/2023 Maricela Curet delivered work accommodation form  to Engineering geologist.  Form completed today placed in collaborative pick up bin for provider to review, amend an return to this nurse to process.

## 2023-05-08 NOTE — Telephone Encounter (Signed)
Today this nurse received work accommodation form signed by  provider.  Successfully returned via fax to [facility] [phone]).  Copy to [facility] H.I.M bin designated for items to be scanned.  Form mailed to Maricela Curet address on file.   [address] [place] [address] No further instructions received or actions performed by this nurse.

## 2023-05-09 LAB — MULTIPLE MYELOMA PANEL, SERUM
Albumin SerPl Elph-Mcnc: 3.6 g/dL (ref 2.9–4.4)
Albumin/Glob SerPl: 1.2 (ref 0.7–1.7)
Alpha 1: 0.2 g/dL (ref 0.0–0.4)
Alpha2 Glob SerPl Elph-Mcnc: 0.6 g/dL (ref 0.4–1.0)
B-Globulin SerPl Elph-Mcnc: 1 g/dL (ref 0.7–1.3)
Gamma Glob SerPl Elph-Mcnc: 1.3 g/dL (ref 0.4–1.8)
Globulin, Total: 3.2 g/dL (ref 2.2–3.9)
IgA: 201 mg/dL (ref 87–352)
IgG (Immunoglobin G), Serum: 1562 mg/dL (ref 586–1602)
IgM (Immunoglobulin M), Srm: 36 mg/dL (ref 26–217)
Total Protein ELP: 6.8 g/dL (ref 6.0–8.5)

## 2023-05-10 ENCOUNTER — Encounter: Payer: Self-pay | Admitting: Orthopedic Surgery

## 2023-05-10 ENCOUNTER — Other Ambulatory Visit (INDEPENDENT_AMBULATORY_CARE_PROVIDER_SITE_OTHER): Payer: BC Managed Care – PPO

## 2023-05-10 ENCOUNTER — Ambulatory Visit (INDEPENDENT_AMBULATORY_CARE_PROVIDER_SITE_OTHER): Payer: BC Managed Care – PPO | Admitting: Orthopedic Surgery

## 2023-05-10 DIAGNOSIS — Z96611 Presence of right artificial shoulder joint: Secondary | ICD-10-CM

## 2023-05-10 DIAGNOSIS — M545 Low back pain, unspecified: Secondary | ICD-10-CM

## 2023-05-10 NOTE — Progress Notes (Signed)
Office Visit Note   Patient: Hannah [name]           Date of Birth: 07/19/1960           MRN: [id number] Visit Date: 05/10/2023 Requested by: Gaspar Garbe, MD [address] [place],  [place] [address] PCP: Gaspar Garbe, MD  Subjective: Chief Complaint  Patient presents with   Right Shoulder - Routine Post Op    01/31/23 (42m 8d) Reverse Shoulder Arthroplasty - Right  Biceps Tenodesis - Right       HPI: CHALLIS WIDEMAN is a 62 y.o. female who presents to the office reporting right shoulder pain as well as some back pain.  Patient had a fall around Thanksgiving.  This was a fall where she impacted her right knee primarily and then essentially rolled onto the shoulder.  She was doing well prior to that fall.  Has been taking some Tylenol arthritis which has helped.  Also doing physical therapy exercises.  She has also had since she was last seen MRIs of her pelvis as well as lumbar spine.  She does have some L 5 S1 degenerative changes.  She definitely does not want any type of back injection.  Aqua therapy has helped her in the past..                ROS: All systems reviewed are negative as they relate to the chief complaint within the history of present illness.  Patient denies fevers or chills.  Assessment & Plan: Visit Diagnoses:  1. S/P reverse total shoulder arthroplasty, right   2. Low back pain, unspecified back pain laterality, unspecified chronicity, unspecified whether sciatica present     Plan: Impression is no structural problem in that right shoulder.  I think she may have some rotator cuff tendinitis versus some bruising.  Shoulder still has good passive and active range of motion.  No indication for imaging at this time but if she is not trending better over the next 6 weeks we could consider CT scan.  No acromial tenderness today.  Regarding her back I think physical therapy for aqua therapy at [facility] would be indicated.  We have ordered this before but we  will reorder aqua therapy 2 times a week for 4 to 6 weeks for lumbar spine spondylosis to be done at [facility].  No evidence of any recurrence or enlarging of the multiple myeloma lesions has occurred.  Follow-Up Instructions: No follow-ups on file.   Orders:  Orders Placed This Encounter  Procedures   XR Shoulder Right   Ambulatory referral to Physical Therapy   No orders of the defined types were placed in this encounter.     Procedures: No procedures performed   Clinical Data: No additional findings.  Objective: Vital Signs: LMP 10/22/2016 (Exact Date)   Physical Exam:  Constitutional: Patient appears well-developed HEENT:  Head: Normocephalic Eyes:EOM are normal Neck: Normal range of motion Cardiovascular: Normal rate Pulmonary/chest: Effort normal Neurologic: Patient is alert Skin: Skin is warm Psychiatric: Patient has normal mood and affect  Ortho Exam: Ortho exam demonstrates pretty reasonable range of motion of that right shoulder of 30/95/150.  Rotator cuff strength is good but external rotation is a little painful.  Internal rotation strength is intact.  No masses lymphadenopathy or skin changes noted in that shoulder girdle region.  No nerve root tension signs in bilateral lower extremities.  Specialty Comments:  No specialty comments available.  Imaging: XR Shoulder Right  Result  Date: 05/10/2023 AP axillary outlet radiographs right shoulder reviewed.  Reverse shoulder prosthesis in good position alignment with no complicating features.  No fracture.  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    Major depression, recurrent (HCC) 01/12/2018   Chronic right shoulder pain 12/20/2017   MDD (major depressive disorder), recurrent episode, moderate (HCC) 11/04/2017   Primary osteoarthritis of both hands 09/26/2017   Primary osteoarthritis of right shoulder 09/26/2017   Primary osteoarthritis of both hips 09/26/2017   Status post total knee replacement, right 09/26/2017   Primary osteoarthritis of both feet 09/26/2017   History of asthma 09/26/2017   Primary osteoarthritis of left knee 04/18/2017   Menorrhagia 11/04/2016   Fibroid tumor 11/04/2016   Adenomyosis 11/04/2016   Hypertension, essential 11/04/2016   Total knee replacement status 01/28/2016   Past Medical History:  Diagnosis Date   Allergy    Anemia    Anxiety    Arthritis    Asthma    Cancer (HCC)    multiple myeloma   Depression    Hypertension     Family History  Problem Relation Age of Onset   Cancer Mother        uterine    Asthma Mother    Arthritis Daughter    Depression Maternal Uncle    Colon cancer Neg Hx     Past Surgical History:  Procedure Laterality Date   ABDOMINAL HYSTERECTOMY     BICEPT TENODESIS Right 01/31/2023   Procedure: BICEPS TENODESIS;  Surgeon: Cammy Copa, MD;  Location: [facility] OR;  Service: Orthopedics;  Laterality: Right;   CHOLECYSTECTOMY  1993   COLONOSCOPY     HERNIA REPAIR     REVERSE SHOULDER ARTHROPLASTY Right 01/31/2023   Procedure: REVERSE SHOULDER ARTHROPLASTY;  Surgeon: Cammy Copa, MD;  Location: [facility] OR;  Service: Orthopedics;  Laterality: Right;   TOTAL KNEE ARTHROPLASTY Right 01/28/2016   TOTAL KNEE ARTHROPLASTY Right 01/28/2016   Procedure: RIGHT TOTAL KNEE ARTHROPLASTY;  Surgeon: Tarry Kos, MD;  Location: [facility] OR;  Service: Orthopedics;  Laterality: Right;   TRIGGER FINGER RELEASE Right 06/18/2014   Procedure: RIGHT LONG FINGER TRIGGER RELEASE;  Surgeon: Cheral Almas, MD;  Location: [facility];  Service: Orthopedics;  Laterality:  Right;   TUBAL LIGATION     VAGINAL HYSTERECTOMY Bilateral 11/04/2016   Procedure: HYSTERECTOMY VAGINAL with Bilateral Salpingectomy;  Surgeon: Hal Morales, MD;  Location: [facility];  Service: Gynecology;  Laterality: Bilateral;   WEIL OSTEOTOMY Right 07/07/2020   Procedure: RIGHT FOOT WEIL OSTEOTOMY 2, 3, AND 4 METATARSALS AND PROXIMAL INTERPHALANGEAL JOINT FUSION 2 & 4 TOES;  Surgeon: Nadara Mustard, MD;  Location: [facility];  Service: Orthopedics;  Laterality: Right;   Social History   Occupational History   Not on file  Tobacco Use   Smoking status: Never   Smokeless tobacco: Never  Vaping Use   Vaping status: Never Used  Substance and Sexual Activity   Alcohol use: No    Alcohol/week: 0.0 standard drinks of alcohol   Drug use: No   Sexual activity: Yes  Partners: Male    Birth control/protection: Surgical

## 2023-05-26 ENCOUNTER — Other Ambulatory Visit: Payer: Self-pay

## 2023-05-26 ENCOUNTER — Other Ambulatory Visit: Payer: Self-pay | Admitting: Hematology

## 2023-05-26 MED ORDER — LENALIDOMIDE 10 MG PO CAPS: 10.0000 mg | ORAL_CAPSULE | Freq: Every day | ORAL | 0 refills | Status: DC

## 2023-06-13 NOTE — Therapy (Signed)
 OUTPATIENT PHYSICAL THERAPY THORACOLUMBAR EVALUATION   Patient Name: Hannah [name] MRN: [id number] DOB:[birth year], 63 y.o., female Today's Date: 06/14/2023  END OF SESSION:  PT End of Session - 06/14/23 0902     Visit Number 1    Number of Visits 12    Date for PT Re-Evaluation 08/05/23    Authorization Type BCBS    PT Start Time [phone]    PT Stop Time 0845    PT Time Calculation (min) 29 min    Activity Tolerance Patient tolerated treatment well    Behavior During Therapy [facility] for tasks assessed/performed;Agitated             Past Medical History:  Diagnosis Date   Allergy    Anemia    Anxiety    Arthritis    Asthma    Cancer (HCC)    multiple myeloma   Depression    Hypertension    Past Surgical History:  Procedure Laterality Date   ABDOMINAL HYSTERECTOMY     BICEPT TENODESIS Right 01/31/2023   Procedure: BICEPS TENODESIS;  Surgeon: Jasmine Mesi, MD;  [facility];  Service: Orthopedics;  Laterality: Right;   CHOLECYSTECTOMY  1993   COLONOSCOPY     HERNIA REPAIR     REVERSE SHOULDER ARTHROPLASTY Right 01/31/2023   Procedure: REVERSE SHOULDER ARTHROPLASTY;  Surgeon: Jasmine Mesi, MD;  Location[facility] OR;  Service: Orthopedics;  Laterality: Right;   TOTAL KNEE ARTHROPLASTY Right 01/28/2016   TOTAL KNEE ARTHROPLASTY Right 01/28/2016   Procedure: RIGHT TOTAL KNEE ARTHROPLASTY;  Surgeon: Wes Hamman, MD;  [facility]: [facility];  Service: Orthopedics;  Laterality: Right;   TRIGGER FINGER RELEASE Right 06/18/2014   Procedure: RIGHT LONG FINGER TRIGGER RELEASE;  Surgeon: Edison Gore, MD;  Location: [facility];  Service: Orthopedics;  Laterality: Right;   TUBAL LIGATION     VAGINAL HYSTERECTOMY Bilateral 11/04/2016   Procedure: HYSTERECTOMY VAGINAL with Bilateral Salpingectomy;  Surgeon: Stevenson Elbe, MD;  Location: [facility];  Service: Gynecology;  Laterality: Bilateral;   WEIL OSTEOTOMY Right 07/07/2020   Procedure: RIGHT FOOT WEIL  OSTEOTOMY 2, 3, AND 4 METATARSALS AND PROXIMAL INTERPHALANGEAL JOINT FUSION 2 & 4 TOES;  Surgeon: Timothy Ford, MD;  Location: [facility];  Service: Orthopedics;  Laterality: Right;   Patient Active Problem List   Diagnosis Date Noted   Biceps tendonitis on right 02/11/2023   S/P reverse total shoulder arthroplasty, right 01/31/2023   Multiple myeloma in remission (HCC) 07/13/2020   Claw toe, right    Trigger finger, left middle finger 04/09/2020   Stenosing tenosynovitis of finger of left hand 02/27/2020   Hemoglobin C trait (HCC) 02/27/2018   Major depression, recurrent (HCC) 01/12/2018   Chronic right shoulder pain 12/20/2017   MDD (major depressive disorder), recurrent episode, moderate (HCC) 11/04/2017   Primary osteoarthritis of both hands 09/26/2017   Primary osteoarthritis of right shoulder 09/26/2017   Primary osteoarthritis of both hips 09/26/2017   Status post total knee replacement, right 09/26/2017   Primary osteoarthritis of both feet 09/26/2017   History of asthma 09/26/2017   Primary osteoarthritis of left knee 04/18/2017   Menorrhagia 11/04/2016   Fibroid tumor 11/04/2016   Adenomyosis 11/04/2016   Hypertension, essential 11/04/2016   Total knee replacement status 01/28/2016    PCP: Dr Malachi Screws  REFERRING PROVIDER:   Jasmine Mesi, MD    REFERRING DIAG: M54.50 (ICD-10-CM) - Low back pain, unspecified back pain laterality, unspecified  chronicity, unspecified whether sciatica present   Rationale for Evaluation and Treatment: Rehabilitation  THERAPY DIAG:  Other low back pain - Plan: PT plan of care cert/re-cert  Muscle weakness (generalized) - Plan: PT plan of care cert/re-cert  Fatigue, unspecified type - Plan: PT plan of care cert/re-cert  ONSET DATE: 3 yrs  SUBJECTIVE:                                                                                                                                                                                            SUBJECTIVE STATEMENT: Tingling in both feet since cancer treatment. LBP for past 3 years since ca treatment  PERTINENT HISTORY:  Aqua therapy hip and back 2x/wk for 4-6wks lumbar spine spondylosis  -recent reverse shoulder replacement -multiple myeloma  PAIN:  Are you having pain? Yes: NPRS scale: current 5/10; worst 9/10; least 3/10 Pain location:   Pain description: varies Aggravating factors: undefined Relieving factors: undefined  PRECAUTIONS: None  RED FLAGS: None   WEIGHT BEARING RESTRICTIONS: No  FALLS:  Has patient fallen in last 6 months? No  LIVING ENVIRONMENT: Lives with: lives with their family Lives in: House/apartment Stairs: Yes: Internal: 16 steps; on right going up Has following equipment at home: None  OCCUPATION: sititng job  PLOF: Independent  PATIENT GOALS: decrease pain  NEXT MD VISIT: post aquaitcs  OBJECTIVE:  Note: Objective measures were completed at Evaluation unless otherwise noted.  DIAGNOSTIC FINDINGS:  04/08/23 MRI Lumbar IMPRESSION: 1. Treated myelomatous lesion in the bony pelvis. No new myelomatous lesion observed. 2. Lumbar spondylosis and degenerative disc disease, causing mild impingement at L5-S1.  PATIENT SURVEYS:  FOTO Primary score 42% with goal of 53%  COGNITION: Overall cognitive status: Within functional limits for tasks assessed     SENSATION: P&N bilateral feet  MUSCLE LENGTH:   POSTURE: No Significant postural limitations  PALPATION: No TTP  LUMBAR ROM:   WFL with pain  LOWER EXTREMITY ROM:     WFL  LOWER EXTREMITY MMT:    HD (lbs) Right eval Left eval  Hip flexion 27.2 34.2  Hip extension    Hip abduction 19.3 23.5  Hip adduction    Hip internal rotation    Hip external rotation    Knee flexion    Knee extension 42.0 41.8  Ankle dorsiflexion    Ankle plantarflexion    Ankle inversion    Ankle eversion     (Blank rows = not tested)  LUMBAR SPECIAL  TESTS:  Slump test: Negative  FUNCTIONAL TESTS:  5 times sit to stand: 22.59 TUG 15.49  GAIT: Distance walked: 500 ft Assistive device  utilized: None Level of assistance: Complete Independence Comments: Cadence slowed  TREATMENT Eval                                                                                                                                PATIENT EDUCATION:  Education details: Discussed eval findings, rehab rationale, aquatic program progression/POC and pools in area. Patient is in agreement  Person educated: Patient Education method: Explanation Education comprehension: verbalized understanding  HOME EXERCISE PROGRAM: Aquatic TBA  ASSESSMENT:  CLINICAL IMPRESSION: Patient is a 63 y.o. f who was seen today for physical therapy evaluation and treatment for LBP. She has hx of multiple myeloma and is currently taking oral form of chemo on a 21 day cycle.  She reports having multiple lesions on her spine which has caused weakness.  She reports P&N in feet constantly since beginning chemo 3 years ago. She has received therapy in past but has had a decline in strength since.  Pt EDU on importance of maintaining and exercises program to retain gains made and potentially increasing.  She VU.  She will benefit from aquatic therapy intervention to improve deficits, decrease pain and return pt to optimal function/QOL. Will be assisted in finding access to a pool as able, to continue with program and/or refer to land based intervention for land based program.  OBJECTIVE IMPAIRMENTS: decreased activity tolerance, decreased endurance, decreased mobility, decreased strength, and pain.   ACTIVITY LIMITATIONS: carrying, lifting, bending, and locomotion level  PARTICIPATION LIMITATIONS: cleaning, laundry, community activity, and yard work  PERSONAL FACTORS: Behavior pattern, Past/current experiences, and 1-2 comorbidities: see PmHx  are also affecting patient's functional  outcome.   REHAB POTENTIAL: Good  CLINICAL DECISION MAKING: Evolving/moderate complexity  EVALUATION COMPLEXITY: Moderate   GOALS: Goals reviewed with patient? Yes  SHORT TERM GOALS: Target date: 08/05/23  Pt to meet stated Foto Goal Baseline: Goal status: INITIAL  2. Pt will tolerate walking x >15 minutes without limitation to pain Baseline: up to 10 Goal status: INITIAL  3.  Pt will tolerate full aquatic sessions consistently without increase in pain and with improving function to demonstrate good toleration and effectiveness of intervention.  Baseline:  Goal status: INITIAL  4.  Pt will improve on Tug test to <or= 13s to demonstrate statistically significant change and improvement in lower extremity function, mobility and decreased fall risk. Baseline: 15.49 Goal status: INITIAL  5.  Pt will improve on 5 X STS test to <or= 20s to demonstrate the MCID, and improving functional lower extremity strength, transitional movements, and balance Baseline: 22.59 Goal status: INITIAL  6.  Pt will be indep with final aquatic HEP for continued management of condition and will be instructed/informed of available pool access in area Baseline: no access Goal status: INITIAL  LONG TERM GOALS: Will set as approp at re-cert   PLAN:  PT FREQUENCY: 2x/week  PT DURATION: 7 weeks  PLANNED INTERVENTIONS: 97164- PT Re-evaluation, 97110-Therapeutic exercises, 97530-  Therapeutic activity, [id number]- Neuromuscular re-education, [phone]- Self Care, 60454- Manual therapy, [phone]- Gait training, [phone]- Orthotic Fit/training, [phone]- Aquatic Therapy, [phone]- Ionotophoresis 4mg /ml Dexamethasone , Patient/Family education, Balance training, Stair training, Taping, Dry Needling, Joint mobilization, DME instructions, Cryotherapy, and Moist heat.  PLAN FOR NEXT SESSION: Aquatics only: general strengthening with focus on core and hips.  Pain management   Lucinda Saber) Corbyn Steedman MPT 06/14/23 10:46 AM [facility] [address] [place], [place], [address] Phone: [phone]   Fax:  [phone]

## 2023-06-14 ENCOUNTER — Ambulatory Visit (HOSPITAL_BASED_OUTPATIENT_CLINIC_OR_DEPARTMENT_OTHER): Payer: BC Managed Care – PPO | Attending: Orthopedic Surgery | Admitting: Physical Therapy

## 2023-06-14 ENCOUNTER — Other Ambulatory Visit: Payer: Self-pay

## 2023-06-14 ENCOUNTER — Encounter (HOSPITAL_BASED_OUTPATIENT_CLINIC_OR_DEPARTMENT_OTHER): Payer: Self-pay | Admitting: Physical Therapy

## 2023-06-14 ENCOUNTER — Encounter: Payer: Self-pay | Admitting: Orthopedic Surgery

## 2023-06-14 DIAGNOSIS — M6281 Muscle weakness (generalized): Secondary | ICD-10-CM | POA: Insufficient documentation

## 2023-06-14 DIAGNOSIS — R5383 Other fatigue: Secondary | ICD-10-CM | POA: Insufficient documentation

## 2023-06-14 DIAGNOSIS — M5459 Other low back pain: Secondary | ICD-10-CM | POA: Diagnosis not present

## 2023-06-14 DIAGNOSIS — M545 Low back pain, unspecified: Secondary | ICD-10-CM | POA: Insufficient documentation

## 2023-06-14 NOTE — Telephone Encounter (Signed)
 Standard ct ok thx

## 2023-06-15 ENCOUNTER — Other Ambulatory Visit: Payer: Self-pay

## 2023-06-15 DIAGNOSIS — Z96611 Presence of right artificial shoulder joint: Secondary | ICD-10-CM

## 2023-06-22 ENCOUNTER — Telehealth: Payer: Self-pay | Admitting: Pharmacy Technician

## 2023-06-22 NOTE — Telephone Encounter (Signed)
Oral Oncology Patient Advocate Encounter   Received notification that prior authorization for lenalidomide is due for renewal.   PA submitted on 06/22/23 Case ID: [id number] Submitted via e-fax [phone] Status is pending     Jinger Neighbors, CPhT-Adv Oncology Pharmacy Patient Advocate [facility] Direct Number: [phone]  Fax: [phone]

## 2023-06-23 NOTE — Telephone Encounter (Signed)
Oral Oncology Patient Advocate Encounter  Prior Authorization for lenalidomide has been approved.    PA# [id number] Effective dates: 06/23/23 through 06/22/24  Patient will continue to fill at [facility].    Jinger Neighbors, CPhT-Adv Oncology Pharmacy Patient Advocate [facility] Direct Number: [phone]  Fax: [phone]

## 2023-06-30 ENCOUNTER — Other Ambulatory Visit: Payer: Self-pay

## 2023-06-30 MED ORDER — LENALIDOMIDE 10 MG PO CAPS: 10.0000 mg | ORAL_CAPSULE | Freq: Every day | ORAL | 0 refills | Status: DC

## 2023-07-05 ENCOUNTER — Encounter (HOSPITAL_BASED_OUTPATIENT_CLINIC_OR_DEPARTMENT_OTHER): Payer: Self-pay | Admitting: Physical Therapy

## 2023-07-05 ENCOUNTER — Ambulatory Visit (HOSPITAL_BASED_OUTPATIENT_CLINIC_OR_DEPARTMENT_OTHER): Payer: BC Managed Care – PPO | Attending: Orthopedic Surgery | Admitting: Physical Therapy

## 2023-07-05 DIAGNOSIS — M6281 Muscle weakness (generalized): Secondary | ICD-10-CM | POA: Diagnosis not present

## 2023-07-05 DIAGNOSIS — R5383 Other fatigue: Secondary | ICD-10-CM | POA: Insufficient documentation

## 2023-07-05 DIAGNOSIS — M5459 Other low back pain: Secondary | ICD-10-CM | POA: Diagnosis not present

## 2023-07-05 NOTE — Therapy (Signed)
 OUTPATIENT PHYSICAL THERAPY THORACOLUMBAR TREATMENT   Patient Name: Hannah [name] MRN: [id number] DOB:19-Feb-1961, 63 y.o., female Today's Date: 07/05/2023  END OF SESSION:  PT End of Session - 07/05/23 0803     Visit Number 2    Number of Visits 12    Date for PT Re-Evaluation 08/05/23    Authorization Type BCBS    PT Start Time 0801    PT Stop Time 0839    PT Time Calculation (min) 38 min    Behavior During Therapy [facility] for tasks assessed/performed             Past Medical History:  Diagnosis Date   Allergy    Anemia    Anxiety    Arthritis    Asthma    Cancer (HCC)    multiple myeloma   Depression    Hypertension    Past Surgical History:  Procedure Laterality Date   ABDOMINAL HYSTERECTOMY     BICEPT TENODESIS Right 01/31/2023   Procedure: BICEPS TENODESIS;  Surgeon: Addie Cordella Hamilton, MD;  Location: [facility] OR;  Service: Orthopedics;  Laterality: Right;   CHOLECYSTECTOMY  1993   COLONOSCOPY     HERNIA REPAIR     REVERSE SHOULDER ARTHROPLASTY Right 01/31/2023   Procedure: REVERSE SHOULDER ARTHROPLASTY;  Surgeon: Addie Cordella Hamilton, MD;  Location: [facility] OR;  Service: Orthopedics;  Laterality: Right;   TOTAL KNEE ARTHROPLASTY Right 01/28/2016   TOTAL KNEE ARTHROPLASTY Right 01/28/2016   Procedure: RIGHT TOTAL KNEE ARTHROPLASTY;  Surgeon: Kay CHRISTELLA Cummins, MD;  Location: [facility] OR;  Service: Orthopedics;  Laterality: Right;   TRIGGER FINGER RELEASE Right 06/18/2014   Procedure: RIGHT LONG FINGER TRIGGER RELEASE;  Surgeon: Kay Ozell Cummins, MD;  Location: [facility];  Service: Orthopedics;  Laterality: Right;   TUBAL LIGATION     VAGINAL HYSTERECTOMY Bilateral 11/04/2016   Procedure: HYSTERECTOMY VAGINAL with Bilateral Salpingectomy;  Surgeon: Raeanne Shanda SQUIBB, MD;  Location: [facility];  Service: Gynecology;  Laterality: Bilateral;   WEIL OSTEOTOMY Right 07/07/2020   Procedure: RIGHT FOOT WEIL OSTEOTOMY 2, 3, AND 4 METATARSALS AND PROXIMAL INTERPHALANGEAL JOINT  FUSION 2 & 4 TOES;  Surgeon: Harden Jerona GAILS, MD;  Location:  SURGERY CENTER;  Service: Orthopedics;  Laterality: Right;   Patient Active Problem List   Diagnosis Date Noted   Biceps tendonitis on right 02/11/2023   S/P reverse total shoulder arthroplasty, right 01/31/2023   Multiple myeloma in remission (HCC) 07/13/2020   Claw toe, right    Trigger finger, left middle finger 04/09/2020   Stenosing tenosynovitis of finger of left hand 02/27/2020   Hemoglobin C trait (HCC) 02/27/2018   Major depression, recurrent (HCC) 01/12/2018   Chronic right shoulder pain 12/20/2017   MDD (major depressive disorder), recurrent episode, moderate (HCC) 11/04/2017   Primary osteoarthritis of both hands 09/26/2017   Primary osteoarthritis of right shoulder 09/26/2017   Primary osteoarthritis of both hips 09/26/2017   Status post total knee replacement, right 09/26/2017   Primary osteoarthritis of both feet 09/26/2017   History of asthma 09/26/2017   Primary osteoarthritis of left knee 04/18/2017   Menorrhagia 11/04/2016   Fibroid tumor 11/04/2016   Adenomyosis 11/04/2016   Hypertension, essential 11/04/2016   Total knee replacement status 01/28/2016    PCP: Dr Charlie Reas  REFERRING PROVIDER:   Addie Cordella Hamilton, MD    REFERRING DIAG: M54.50 (ICD-10-CM) - Low back pain, unspecified back pain laterality, unspecified chronicity, unspecified whether sciatica present   Rationale for  Evaluation and Treatment: Rehabilitation  THERAPY DIAG:  Other low back pain  Muscle weakness (generalized)  ONSET DATE: 3 yrs  SUBJECTIVE:                                                                                                                                                                                           SUBJECTIVE STATEMENT: Pt reports no changes since evaluation.  POOL ACCESS:  currently none.  PERTINENT HISTORY:  Aqua therapy hip and back 2x/wk for 4-6wks lumbar spine  spondylosis  -recent reverse shoulder replacement -multiple myeloma  PAIN:  Are you having pain? Yes: NPRS scale: current 4/10; Pain location:  L hip Pain description: dull Aggravating factors: undefined Relieving factors: undefined  PRECAUTIONS: None  RED FLAGS: None   WEIGHT BEARING RESTRICTIONS: No  FALLS:  Has patient fallen in last 6 months? No  LIVING ENVIRONMENT: Lives with: lives with their family Lives in: House/apartment Stairs: Yes: Internal: 16 steps; on right going up Has following equipment at home: None  OCCUPATION: sititng job  PLOF: Independent  PATIENT GOALS: decrease pain  NEXT MD VISIT: post aquaitcs  OBJECTIVE:  Note: Objective measures were completed at Evaluation unless otherwise noted.  DIAGNOSTIC FINDINGS:  04/08/23 MRI Lumbar IMPRESSION: 1. Treated myelomatous lesion in the bony pelvis. No new myelomatous lesion observed. 2. Lumbar spondylosis and degenerative disc disease, causing mild impingement at L5-S1.  PATIENT SURVEYS:  FOTO Primary score 42% with goal of 53%  COGNITION: Overall cognitive status: Within functional limits for tasks assessed     SENSATION: P&N bilateral feet  MUSCLE LENGTH:   POSTURE: No Significant postural limitations  PALPATION: No TTP  LUMBAR ROM:   WFL with pain  LOWER EXTREMITY ROM:     WFL  LOWER EXTREMITY MMT:    HD (lbs) Right eval Left eval  Hip flexion 27.2 34.2  Hip extension    Hip abduction 19.3 23.5  Hip adduction    Hip internal rotation    Hip external rotation    Knee flexion    Knee extension 42.0 41.8  Ankle dorsiflexion    Ankle plantarflexion    Ankle inversion    Ankle eversion     (Blank rows = not tested)  LUMBAR SPECIAL TESTS:  Slump test: Negative  FUNCTIONAL TESTS:  5 times sit to stand: 22.59 TUG 15.49  GAIT: Distance walked: 500 ft Assistive device utilized: None Level of assistance: Complete Independence Comments: Cadence  slowed  TREATMENT    Pt seen for aquatic therapy today.  Treatment took place in water  3.5-4.75 ft in depth at the [facility] pool. Temp of water  was  91.  Pt entered/exited the pool via stairs independently with bilat rail. - unsupported: walking multiple laps forward/ backward with cues for vertical trunk - side stepping R/L -> with yellow hand floats and arm addct/ abdct -> rainbow hand floats -> into wide squat - TrA set with rainbow hand float pull down to tap at same side knee/ opp side knee - UE on wall: hip abdct/ addct 2 x 10 (cues to avoid R side bending with R hip abdct);  leg swings into hip flex/ext x 10 - staggered stance with kick board row (increased R shoulder pain; stopped) - 3 way LE stretch with ankle on solid noodle, 15 sec each position   Pt requires the buoyancy and hydrostatic pressure of water  for support, and to offload joints by unweighting joint load by at least 50 % in navel deep water  and by at least 75-80% in chest to neck deep water .  Viscosity of the water  is needed for resistance of strengthening. Water  current perturbations provides challenge to standing balance requiring increased core activation.                                                                                                                          PATIENT EDUCATION:  Education details: reacquainting with aquatic therapy  Person educated: Patient Education method: Explanation Education comprehension: verbalized understanding  HOME EXERCISE PROGRAM: Aquatic TBA  ASSESSMENT:  CLINICAL IMPRESSION: Pt demonstrates safety and independence in aquatic setting with therapist instructing from deck. Pt demonstrates confidence in setting, moving throughout all depths easily. She tolerated LE stretch with solid noodle well.  She reported sharp pain in R shoulder with staggered stance row; eased with change in exercise.   Goals are ongoing.    From initial evaluation:  Patient is a  63 y.o. f who was seen today for physical therapy evaluation and treatment for LBP. She has hx of multiple myeloma and is currently taking oral form of chemo on a 21 day cycle.  She reports having multiple lesions on her spine which has caused weakness.  She reports P&N in feet constantly since beginning chemo 3 years ago. She has received therapy in past but has had a decline in strength since.  Pt EDU on importance of maintaining and exercises program to retain gains made and potentially increasing.  She VU.  She will benefit from aquatic therapy intervention to improve deficits, decrease pain and return pt to optimal function/QOL. Will be assisted in finding access to a pool as able, to continue with program and/or refer to land based intervention for land based program.  OBJECTIVE IMPAIRMENTS: decreased activity tolerance, decreased endurance, decreased mobility, decreased strength, and pain.   ACTIVITY LIMITATIONS: carrying, lifting, bending, and locomotion level  PARTICIPATION LIMITATIONS: cleaning, laundry, community activity, and yard work  PERSONAL FACTORS: Behavior pattern, Past/current experiences, and 1-2 comorbidities: see PmHx  are also affecting patient's functional outcome.   REHAB POTENTIAL: Good  CLINICAL DECISION MAKING: Evolving/moderate complexity  EVALUATION COMPLEXITY: Moderate   GOALS: Goals reviewed with patient? Yes  SHORT TERM GOALS: Target date: 08/05/23  Pt to meet stated Foto Goal Baseline: Goal status: INITIAL  2. Pt will tolerate walking x >15 minutes without limitation to pain Baseline: up to 10 Goal status: INITIAL  3.  Pt will tolerate full aquatic sessions consistently without increase in pain and with improving function to demonstrate good toleration and effectiveness of intervention.  Baseline:  Goal status: INITIAL  4.  Pt will improve on Tug test to <or= 13s to demonstrate statistically significant change and improvement in lower extremity  function, mobility and decreased fall risk. Baseline: 15.49 Goal status: INITIAL  5.  Pt will improve on 5 X STS test to <or= 20s to demonstrate the MCID, and improving functional lower extremity strength, transitional movements, and balance Baseline: 22.59 Goal status: INITIAL  6.  Pt will be indep with final aquatic HEP for continued management of condition and will be instructed/informed of available pool access in area Baseline: no access Goal status: INITIAL  LONG TERM GOALS: Will set as approp at re-cert   PLAN:  PT FREQUENCY: 2x/week  PT DURATION: 7 weeks  PLANNED INTERVENTIONS: 97164- PT Re-evaluation, 97110-Therapeutic exercises, 97530- Therapeutic activity, 97112- Neuromuscular re-education, 97535- Self Care, 02859- Manual therapy, [id number]- Gait training, [id number]- Orthotic Fit/training, [id number]- Aquatic Therapy, [phone]- Ionotophoresis 4mg /ml Dexamethasone , Patient/Family education, Balance training, Stair training, Taping, Dry Needling, Joint mobilization, DME instructions, Cryotherapy, and Moist heat.  PLAN FOR NEXT SESSION: Aquatics only: general strengthening with focus on core and hips.  Pain management  Delon Aquas, PTA 07/05/23 12:28 PM [facility] [address] [place], [place], [address] Phone: [phone]   Fax:  [phone]

## 2023-07-07 ENCOUNTER — Encounter (HOSPITAL_BASED_OUTPATIENT_CLINIC_OR_DEPARTMENT_OTHER): Payer: Self-pay

## 2023-07-07 ENCOUNTER — Ambulatory Visit (HOSPITAL_BASED_OUTPATIENT_CLINIC_OR_DEPARTMENT_OTHER): Payer: BC Managed Care – PPO | Admitting: Physical Therapy

## 2023-07-12 ENCOUNTER — Ambulatory Visit (HOSPITAL_BASED_OUTPATIENT_CLINIC_OR_DEPARTMENT_OTHER): Payer: BC Managed Care – PPO | Admitting: Physical Therapy

## 2023-07-12 ENCOUNTER — Encounter: Payer: Self-pay | Admitting: Hematology

## 2023-07-12 ENCOUNTER — Telehealth: Payer: Self-pay

## 2023-07-12 NOTE — Telephone Encounter (Signed)
Pt called asking if there was a copay assistance for lanalidomide (Revlimid).  Pt is asking for a return call regarding an assistance program.

## 2023-07-13 ENCOUNTER — Telehealth: Payer: Self-pay | Admitting: Pharmacy Technician

## 2023-07-13 ENCOUNTER — Other Ambulatory Visit: Payer: Self-pay

## 2023-07-13 ENCOUNTER — Other Ambulatory Visit (HOSPITAL_COMMUNITY): Payer: Self-pay

## 2023-07-13 MED ORDER — LENALIDOMIDE 10 MG PO CAPS: 10.0000 mg | ORAL_CAPSULE | Freq: Every day | ORAL | 0 refills | Status: DC

## 2023-07-13 MED ORDER — LENALIDOMIDE 10 MG PO CAPS
10.0000 mg | ORAL_CAPSULE | Freq: Every day | ORAL | 0 refills | Status: DC
Start: 2023-07-13 — End: 2023-07-13

## 2023-07-13 NOTE — Telephone Encounter (Signed)
Oral Oncology Patient Advocate Encounter  I confirmed with the rep that there was already a copay card on file for [employer] manufactured lenalidomide as they also offer a copay card program. This manufacturer is currently having supply chain issues and cannot be used for patients until stock is replenished. There is no timeline estimated for this.  The patient has a copay card on file from past fills for branded Revlimid. I have [facility] so that the pharmacy can attempt to use the [employer] copay card.  Jinger Neighbors, CPhT-Adv Oncology Pharmacy Patient Advocate [facility] Direct Number: [phone]  Fax: [phone]

## 2023-07-13 NOTE — Telephone Encounter (Signed)
Oral Oncology Patient Advocate Encounter   Was successful in obtaining a copay card for lenalidomide.  This copay card will only cover lenalidomide manufactured by [employer]. The card has a max benefit of $100 per 30 day fill  The billing information is as follows and has been shared with [facility]. After processing, the representative still showed a large copay balance for the patient.   RxBin: [id number] PCN: [id number] Member ID: [id number] Group ID: [id number]   Jinger Neighbors, CPhT-Adv Oncology Pharmacy Patient Advocate [facility] Direct Number: [phone]  Fax: [phone]

## 2023-07-13 NOTE — Telephone Encounter (Signed)
Oral Oncology Patient Advocate Encounter  Spoke with [facility] and confirmed the new rx has been received for brand REVLIMID The rx was able to be processed with a  copay card the patient had on file. The representative, Victorino Dike, confirmed the claim now shows a $0 copay.  Patient will need to call and reschedule shipment.  Jinger Neighbors, CPhT-Adv Oncology Pharmacy Patient Advocate [facility] Direct Number: [phone]  Fax: [phone]

## 2023-07-14 ENCOUNTER — Encounter (HOSPITAL_BASED_OUTPATIENT_CLINIC_OR_DEPARTMENT_OTHER): Payer: Self-pay | Admitting: Physical Therapy

## 2023-07-14 ENCOUNTER — Ambulatory Visit (HOSPITAL_BASED_OUTPATIENT_CLINIC_OR_DEPARTMENT_OTHER): Payer: BC Managed Care – PPO | Admitting: Physical Therapy

## 2023-07-14 DIAGNOSIS — M5459 Other low back pain: Secondary | ICD-10-CM

## 2023-07-14 DIAGNOSIS — M6281 Muscle weakness (generalized): Secondary | ICD-10-CM | POA: Diagnosis not present

## 2023-07-14 DIAGNOSIS — R5383 Other fatigue: Secondary | ICD-10-CM | POA: Diagnosis not present

## 2023-07-14 NOTE — Therapy (Signed)
OUTPATIENT PHYSICAL THERAPY THORACOLUMBAR TREATMENT   Patient Name: Hannah [name] MRN: [id number] DOB:18-Oct-1960, 63 y.o., female Today's Date: 07/14/2023  END OF SESSION:  PT End of Session - 07/14/23 0806     Visit Number 3    Number of Visits 12    Date for PT Re-Evaluation 08/05/23    Authorization Type BCBS    PT Start Time 0802    PT Stop Time 0840    PT Time Calculation (min) 38 min    Activity Tolerance Patient tolerated treatment well    Behavior During Therapy [facility] for tasks assessed/performed             Past Medical History:  Diagnosis Date   Allergy    Anemia    Anxiety    Arthritis    Asthma    Cancer (HCC)    multiple myeloma   Depression    Hypertension    Past Surgical History:  Procedure Laterality Date   ABDOMINAL HYSTERECTOMY     BICEPT TENODESIS Right 01/31/2023   Procedure: BICEPS TENODESIS;  Surgeon: Cammy Copa, MD;  Location: [facility] OR;  Service: Orthopedics;  Laterality: Right;   CHOLECYSTECTOMY  1993   COLONOSCOPY     HERNIA REPAIR     REVERSE SHOULDER ARTHROPLASTY Right 01/31/2023   Procedure: REVERSE SHOULDER ARTHROPLASTY;  Surgeon: Cammy Copa, MD;  Location: [facility] OR;  Service: Orthopedics;  Laterality: Right;   TOTAL KNEE ARTHROPLASTY Right 01/28/2016   TOTAL KNEE ARTHROPLASTY Right 01/28/2016   Procedure: RIGHT TOTAL KNEE ARTHROPLASTY;  Surgeon: Tarry Kos, MD;  Location: [facility];  Service: Orthopedics;  Laterality: Right;   TRIGGER FINGER RELEASE Right 06/18/2014   Procedure: RIGHT LONG FINGER TRIGGER RELEASE;  Surgeon: Cheral Almas, MD;  Location: [facility];  Service: Orthopedics;  Laterality: Right;   TUBAL LIGATION     VAGINAL HYSTERECTOMY Bilateral 11/04/2016   Procedure: HYSTERECTOMY VAGINAL with Bilateral Salpingectomy;  Surgeon: Hal Morales, MD;  Location: [facility];  Service: Gynecology;  Laterality: Bilateral;   WEIL OSTEOTOMY Right 07/07/2020   Procedure: RIGHT FOOT WEIL OSTEOTOMY 2, 3,  AND 4 METATARSALS AND PROXIMAL INTERPHALANGEAL JOINT FUSION 2 & 4 TOES;  Surgeon: Nadara Mustard, MD;  Location: [facility];  Service: Orthopedics;  Laterality: Right;   Patient Active Problem List   Diagnosis Date Noted   Biceps tendonitis on right 02/11/2023   S/P reverse total shoulder arthroplasty, right 01/31/2023   Multiple myeloma in remission (HCC) 07/13/2020   Claw toe, right    Trigger finger, left middle finger 04/09/2020   Stenosing tenosynovitis of finger of left hand 02/27/2020   Hemoglobin C trait (HCC) 02/27/2018   Major depression, recurrent (HCC) 01/12/2018   Chronic right shoulder pain 12/20/2017   MDD (major depressive disorder), recurrent episode, moderate (HCC) 11/04/2017   Primary osteoarthritis of both hands 09/26/2017   Primary osteoarthritis of right shoulder 09/26/2017   Primary osteoarthritis of both hips 09/26/2017   Status post total knee replacement, right 09/26/2017   Primary osteoarthritis of both feet 09/26/2017   History of asthma 09/26/2017   Primary osteoarthritis of left knee 04/18/2017   Menorrhagia 11/04/2016   Fibroid tumor 11/04/2016   Adenomyosis 11/04/2016   Hypertension, essential 11/04/2016   Total knee replacement status 01/28/2016    PCP: Dr Guerry Bruin  REFERRING PROVIDER:   Cammy Copa, MD    REFERRING DIAG: M54.50 (ICD-10-CM) - Low back pain, unspecified back pain laterality, unspecified  chronicity, unspecified whether sciatica present   Rationale for Evaluation and Treatment: Rehabilitation  THERAPY DIAG:  Other low back pain  Muscle weakness (generalized)  ONSET DATE: 3 yrs  SUBJECTIVE:                                                                                                                                                                                           SUBJECTIVE STATEMENT: Pt reports she was a little sore after last session, but not too bad. Had a deep tissue massage last  Sunday, and her R shoulder/arm is feeling better.   POOL ACCESS:  currently none.  PERTINENT HISTORY:  Aqua therapy hip and back 2x/wk for 4-6wks lumbar spine spondylosis  -recent reverse shoulder replacement -multiple myeloma  PAIN:  Are you having pain? Yes: NPRS scale: current 3-4/10; Pain location:  L hip Pain description: dull Aggravating factors: prolonged positions Relieving factors: stretches  PRECAUTIONS: None  RED FLAGS: None   WEIGHT BEARING RESTRICTIONS: No  FALLS:  Has patient fallen in last 6 months? No  LIVING ENVIRONMENT: Lives with: lives with their family Lives in: House/apartment Stairs: Yes: Internal: 16 steps; on right going up Has following equipment at home: None  OCCUPATION: sititng job  PLOF: Independent  PATIENT GOALS: decrease pain  NEXT MD VISIT: post aquatic therapy  OBJECTIVE:  Note: Objective measures were completed at Evaluation unless otherwise noted.  DIAGNOSTIC FINDINGS:  04/08/23 MRI Lumbar IMPRESSION: 1. Treated myelomatous lesion in the bony pelvis. No new myelomatous lesion observed. 2. Lumbar spondylosis and degenerative disc disease, causing mild impingement at L5-S1.  PATIENT SURVEYS:  FOTO Primary score 42% with goal of 53%  COGNITION: Overall cognitive status: Within functional limits for tasks assessed     SENSATION: P&N bilateral feet  MUSCLE LENGTH:   POSTURE: No Significant postural limitations  PALPATION: No TTP  LUMBAR ROM:   WFL with pain  LOWER EXTREMITY ROM:     WFL  LOWER EXTREMITY MMT:    HD (lbs) Right eval Left eval  Hip flexion 27.2 34.2  Hip extension    Hip abduction 19.3 23.5  Hip adduction    Hip internal rotation    Hip external rotation    Knee flexion    Knee extension 42.0 41.8  Ankle dorsiflexion    Ankle plantarflexion    Ankle inversion    Ankle eversion     (Blank rows = not tested)  LUMBAR SPECIAL TESTS:  Slump test: Negative  FUNCTIONAL TESTS:  5  times sit to stand: 22.59 TUG 15.49  GAIT: Distance walked: 500 ft Assistive device utilized: None Level of assistance: Complete  Independence Comments: Cadence slowed  TREATMENT    Pt seen for aquatic therapy today.  Treatment took place in water 3.5-4.75 ft in depth at the [facility] pool. Temp of water was 91.  Pt entered/exited the pool via stairs independently with bilat rail. - unsupported: walking multiple laps forward/ backward with cues for vertical trunk - side stepping R/L -> with rainbow hand floats and arm addct/ abdct -> into wide squat - farmer carry with bilat rainbow hand floats at side, marching forward/ backward - TrA set with rainbow hand float pull down to tap at same side knee/ opp side knee - UE on wall: hip openers and crosses;  single leg clams x 10; hip circles CW/CCW x 10 each LE;  - return to walking forward/backward - straddling yellow noodle, UE on wall-> UE on rainbow hand floats:  cycling,  hip abdct/ addct  (hands returned to wall0-> cross country ski - 3 way LE stretch with ankle on solid noodle, 15 sec each position, 2 reps L/R/L - trial of fig 4 at stairs - not tolerated with LLE.  - self care: discussed getting up from desk every 45 min-1 hr during work day to help avoid stiffness in back and hip  Pt requires the buoyancy and hydrostatic pressure of water for support, and to offload joints by unweighting joint load by at least 50 % in navel deep water and by at least 75-80% in chest to neck deep water.  Viscosity of the water is needed for resistance of strengthening. Water current perturbations provides challenge to standing balance requiring increased core activation.                                                                                                                          PATIENT EDUCATION:  Education details: reacquainting with aquatic therapy  Person educated: Patient Education method: Explanation Education  comprehension: verbalized understanding  HOME EXERCISE PROGRAM: Access Code: [email or web address] URL: [email or web address] Date: 07/14/2023  * not issued yet.  Prepared by: [facility] - [facility] - [address] [address]   ASSESSMENT:  CLINICAL IMPRESSION: Pt tolerated session well, with reported "increased awareness of movement in L hip"; no increase in pain.  List of area pools issued today.  Began creating aquatic HEP and will continue modifying and editing until ready to issue. Lt hip rotators appear tight; limited tolerance for fig 4 stretch.  Goals are ongoing.    From initial evaluation:  Patient is a 63 y.o. f who was seen today for physical therapy evaluation and treatment for LBP. She has hx of multiple myeloma and is currently taking oral form of chemo on a 21 day cycle.  She reports having multiple lesions on her spine which has caused weakness.  She reports P&N in feet constantly since beginning chemo 3 years ago. She has received therapy in past but has had a decline in strength since.  Pt EDU on importance of maintaining and exercises program to retain gains  made and potentially increasing.  She VU.  She will benefit from aquatic therapy intervention to improve deficits, decrease pain and return pt to optimal function/QOL. Will be assisted in finding access to a pool as able, to continue with program and/or refer to land based intervention for land based program.  OBJECTIVE IMPAIRMENTS: decreased activity tolerance, decreased endurance, decreased mobility, decreased strength, and pain.   ACTIVITY LIMITATIONS: carrying, lifting, bending, and locomotion level  PARTICIPATION LIMITATIONS: cleaning, laundry, community activity, and yard work  PERSONAL FACTORS: Behavior pattern, Past/current experiences, and 1-2 comorbidities: see PmHx  are also affecting patient's functional outcome.   REHAB POTENTIAL: Good  CLINICAL DECISION MAKING: Evolving/moderate complexity  EVALUATION  COMPLEXITY: Moderate   GOALS: Goals reviewed with patient? Yes  SHORT TERM GOALS: Target date: 08/05/23  Pt to meet stated Foto Goal Baseline: Goal status: INITIAL  2. Pt will tolerate walking x >15 minutes without limitation to pain Baseline: up to 10 Goal status: INITIAL  3.  Pt will tolerate full aquatic sessions consistently without increase in pain and with improving function to demonstrate good toleration and effectiveness of intervention.  Baseline:  Goal status: INITIAL  4.  Pt will improve on Tug test to <or= 13s to demonstrate statistically significant change and improvement in lower extremity function, mobility and decreased fall risk. Baseline: 15.49 Goal status: INITIAL  5.  Pt will improve on 5 X STS test to <or= 20s to demonstrate the MCID, and improving functional lower extremity strength, transitional movements, and balance Baseline: 22.59 Goal status: INITIAL  6.  Pt will be indep with final aquatic HEP for continued management of condition and will be instructed/informed of available pool access in area Baseline: no access Goal status: INITIAL  LONG TERM GOALS: Will set as approp at re-cert   PLAN:  PT FREQUENCY: 2x/week  PT DURATION: 7 weeks  PLANNED INTERVENTIONS: 97164- PT Re-evaluation, 97110-Therapeutic exercises, 97530- Therapeutic activity, 97112- Neuromuscular re-education, 97535- Self Care, 16109- Manual therapy, [id number]- Gait training, [phone]- Orthotic Fit/training, [id number]- Aquatic Therapy, [phone]- Ionotophoresis 4mg /ml Dexamethasone, Patient/Family education, Balance training, Stair training, Taping, Dry Needling, Joint mobilization, DME instructions, Cryotherapy, and Moist heat.  PLAN FOR NEXT SESSION: Aquatics only: general strengthening with focus on core and hips.  Pain management  Mayer Camel, PTA 07/14/23 10:49 AM [facility] [address] [place], [place], [address] Phone:  [phone]   Fax:  [phone]

## 2023-07-19 ENCOUNTER — Ambulatory Visit (HOSPITAL_BASED_OUTPATIENT_CLINIC_OR_DEPARTMENT_OTHER): Payer: BC Managed Care – PPO | Admitting: Physical Therapy

## 2023-07-20 ENCOUNTER — Ambulatory Visit (HOSPITAL_BASED_OUTPATIENT_CLINIC_OR_DEPARTMENT_OTHER): Payer: Self-pay | Admitting: Physical Therapy

## 2023-07-21 ENCOUNTER — Ambulatory Visit (HOSPITAL_BASED_OUTPATIENT_CLINIC_OR_DEPARTMENT_OTHER): Payer: BC Managed Care – PPO | Admitting: Physical Therapy

## 2023-07-21 ENCOUNTER — Encounter (HOSPITAL_BASED_OUTPATIENT_CLINIC_OR_DEPARTMENT_OTHER): Payer: Self-pay | Admitting: Physical Therapy

## 2023-07-21 DIAGNOSIS — M6281 Muscle weakness (generalized): Secondary | ICD-10-CM

## 2023-07-21 DIAGNOSIS — M5459 Other low back pain: Secondary | ICD-10-CM | POA: Diagnosis not present

## 2023-07-21 DIAGNOSIS — R5383 Other fatigue: Secondary | ICD-10-CM

## 2023-07-21 NOTE — Therapy (Signed)
OUTPATIENT PHYSICAL THERAPY THORACOLUMBAR TREATMENT   Patient Name: Hannah [name] MRN: [id number] DOB:11-Aug-1960, 63 y.o., female Today's Date: 07/21/2023  END OF SESSION:  PT End of Session - 07/21/23 0821     Visit Number 4    Number of Visits 12    Date for PT Re-Evaluation 08/05/23    Authorization Type BCBS    PT Start Time 0801    PT Stop Time 0854    PT Time Calculation (min) 53 min    Activity Tolerance Patient tolerated treatment well    Behavior During Therapy [facility] for tasks assessed/performed             Past Medical History:  Diagnosis Date   Allergy    Anemia    Anxiety    Arthritis    Asthma    Cancer (HCC)    multiple myeloma   Depression    Hypertension    Past Surgical History:  Procedure Laterality Date   ABDOMINAL HYSTERECTOMY     BICEPT TENODESIS Right 01/31/2023   Procedure: BICEPS TENODESIS;  Surgeon: Cammy Copa, MD;  Location: [facility] OR;  Service: Orthopedics;  Laterality: Right;   CHOLECYSTECTOMY  1993   COLONOSCOPY     HERNIA REPAIR     REVERSE SHOULDER ARTHROPLASTY Right 01/31/2023   Procedure: REVERSE SHOULDER ARTHROPLASTY;  Surgeon: Cammy Copa, MD;  Location: [facility] OR;  Service: Orthopedics;  Laterality: Right;   TOTAL KNEE ARTHROPLASTY Right 01/28/2016   TOTAL KNEE ARTHROPLASTY Right 01/28/2016   Procedure: RIGHT TOTAL KNEE ARTHROPLASTY;  Surgeon: Tarry Kos, MD;  Location: [facility] OR;  Service: Orthopedics;  Laterality: Right;   TRIGGER FINGER RELEASE Right 06/18/2014   Procedure: RIGHT LONG FINGER TRIGGER RELEASE;  Surgeon: Cheral Almas, MD;  Location: [facility];  Service: Orthopedics;  Laterality: Right;   TUBAL LIGATION     VAGINAL HYSTERECTOMY Bilateral 11/04/2016   Procedure: HYSTERECTOMY VAGINAL with Bilateral Salpingectomy;  Surgeon: Hal Morales, MD;  Location: [facility];  Service: Gynecology;  Laterality: Bilateral;   WEIL OSTEOTOMY Right 07/07/2020   Procedure: RIGHT FOOT WEIL OSTEOTOMY 2, 3,  AND 4 METATARSALS AND PROXIMAL INTERPHALANGEAL JOINT FUSION 2 & 4 TOES;  Surgeon: Nadara Mustard, MD;  Location: [facility];  Service: Orthopedics;  Laterality: Right;   Patient Active Problem List   Diagnosis Date Noted   Biceps tendonitis on right 02/11/2023   S/P reverse total shoulder arthroplasty, right 01/31/2023   Multiple myeloma in remission (HCC) 07/13/2020   Claw toe, right    Trigger finger, left middle finger 04/09/2020   Stenosing tenosynovitis of finger of left hand 02/27/2020   Hemoglobin C trait (HCC) 02/27/2018   Major depression, recurrent (HCC) 01/12/2018   Chronic right shoulder pain 12/20/2017   MDD (major depressive disorder), recurrent episode, moderate (HCC) 11/04/2017   Primary osteoarthritis of both hands 09/26/2017   Primary osteoarthritis of right shoulder 09/26/2017   Primary osteoarthritis of both hips 09/26/2017   Status post total knee replacement, right 09/26/2017   Primary osteoarthritis of both feet 09/26/2017   History of asthma 09/26/2017   Primary osteoarthritis of left knee 04/18/2017   Menorrhagia 11/04/2016   Fibroid tumor 11/04/2016   Adenomyosis 11/04/2016   Hypertension, essential 11/04/2016   Total knee replacement status 01/28/2016    PCP: Dr Guerry Bruin  REFERRING PROVIDER:   Cammy Copa, MD    REFERRING DIAG: M54.50 (ICD-10-CM) - Low back pain, unspecified back pain laterality, unspecified  chronicity, unspecified whether sciatica present   Rationale for Evaluation and Treatment: Rehabilitation  THERAPY DIAG:  Other low back pain  Muscle weakness (generalized)  Fatigue, unspecified type  ONSET DATE: 3 yrs  SUBJECTIVE:                                                                                                                                                                                           SUBJECTIVE STATEMENT: Pt reports she has been 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   POOL ACCESS:  currently none.  PERTINENT HISTORY:  Aqua therapy hip and back 2x/wk for 4-6wks lumbar spine spondylosis  -recent reverse shoulder replacement -multiple myeloma  PAIN:  Are you having pain? Yes: NPRS scale: current 2/10; Pain location:  L hip Pain description: dull Aggravating factors: prolonged positions Relieving factors: stretches  PRECAUTIONS: None  RED FLAGS: None   WEIGHT BEARING RESTRICTIONS: No  FALLS:  Has patient fallen in last 6 months? No  LIVING ENVIRONMENT: Lives with: lives with their family Lives in: House/apartment Stairs: Yes: Internal: 16 steps; on right going up Has following equipment at home: None  OCCUPATION: sititng job  PLOF: Independent  PATIENT GOALS: decrease pain  NEXT MD VISIT: post aquatic therapy  OBJECTIVE:  Note: Objective measures were completed at Evaluation unless otherwise noted.  DIAGNOSTIC FINDINGS:  04/08/23 MRI Lumbar IMPRESSION: 1. Treated myelomatous lesion in the bony pelvis. No new myelomatous lesion observed. 2. Lumbar spondylosis and degenerative disc disease, causing mild impingement at L5-S1.  PATIENT SURVEYS:  FOTO Primary score 42% with goal of 53%  COGNITION: Overall cognitive status: Within functional limits for tasks assessed     SENSATION: P&N bilateral feet  MUSCLE LENGTH:   POSTURE: No Significant postural limitations  PALPATION: No TTP  LUMBAR ROM:   WFL with pain  LOWER EXTREMITY ROM:     WFL  LOWER EXTREMITY MMT:    HD (lbs) Right eval Left eval  Hip flexion 27.2 34.2  Hip extension    Hip abduction 19.3 23.5  Hip adduction    Hip internal rotation    Hip external rotation    Knee flexion    Knee extension 42.0 41.8  Ankle dorsiflexion    Ankle plantarflexion    Ankle inversion    Ankle eversion     (Blank rows = not tested)  LUMBAR SPECIAL TESTS:  Slump test:  Negative  FUNCTIONAL TESTS:  5 times sit to stand: 22.59 TUG 15.49  GAIT: Distance walked: 500 ft Assistive device utilized: None Level of assistance:  Complete Independence Comments: Cadence slowed  TREATMENT    Pt seen for aquatic therapy today.  Treatment took place in water 3.5-4.75 ft in depth at the [facility] pool. Temp of water was 91.  Pt entered/exited the pool via stairs independently with bilat rail. - unsupported: walking multiple laps forward/ backward with cues for vertical trunk and reciprocal arm swing - side stepping R/L -> with rainbow hand floats and arm addct/ abdct -> into wide squat - TrA set with rainbow hand float pull down to tap at opp side knee - farmer carry with bilat rainbow hand floats at side, marching in place then side stepping - UE on rainbow hand floats: hip circles CW/CCW x 10 each LE - staggered stance with bilat shoulder horiz abdct/ addct with rainbow hand floats  - UE on wall: alternating single leg clams x 10; Leg swings into hip abdct/ addct crossing midline  - straddling yellow noodle, UE on rainbow hand floats:  cycling,  hip abdct/ addct - 3 way LE stretch with ankle on solid noodle, 15 sec each position, 2 reps L/R/L  Pt requires the buoyancy and hydrostatic pressure of water for support, and to offload joints by unweighting joint load by at least 50 % in navel deep water and by at least 75-80% in chest to neck deep water.  Viscosity of the water is needed for resistance of strengthening. Water current perturbations provides challenge to standing balance requiring increased core activation.   PATIENT EDUCATION:  Education details: reacquainting with aquatic therapy  Person educated: Patient Education method: Explanation Education comprehension: verbalized understanding  HOME EXERCISE PROGRAM: Access Code: [email or web address] URL: [email or web address] Date: 07/14/2023  * not issued yet.  Prepared by: [facility] - Outpatient  Rehab - [address]   ASSESSMENT:  CLINICAL IMPRESSION: Pt tolerated session well, without increase in pain. Will continue modifying and editing aquatic HEP until ready to issue. Lt hip rotators appear tight; pt shown modified pigeon pose for alternative method to stretch on land.  Goals are ongoing. Plan to ck 5x STS or TUG next session.    From initial evaluation:  Patient is a 63 y.o. f who was seen today for physical therapy evaluation and treatment for LBP. She has hx of multiple myeloma and is currently taking oral form of chemo on a 21 day cycle.  She reports having multiple lesions on her spine which has caused weakness.  She reports P&N in feet constantly since beginning chemo 3 years ago. She has received therapy in past but has had a decline in strength since.  Pt EDU on importance of maintaining and exercises program to retain gains made and potentially increasing.  She VU.  She will benefit from aquatic therapy intervention to improve deficits, decrease pain and return pt to optimal function/QOL. Will be assisted in finding access to a pool as able, to continue with program and/or refer to land based intervention for land based program.  OBJECTIVE IMPAIRMENTS: decreased activity tolerance, decreased endurance, decreased mobility, decreased strength, and pain.   ACTIVITY LIMITATIONS: carrying, lifting, bending, and locomotion level  PARTICIPATION LIMITATIONS: cleaning, laundry, community activity, and yard work  PERSONAL FACTORS: Behavior pattern, Past/current experiences, and 1-2 comorbidities: see PmHx  are also affecting patient's functional outcome.   REHAB POTENTIAL: Good  CLINICAL DECISION MAKING: Evolving/moderate complexity  EVALUATION COMPLEXITY: Moderate   GOALS: Goals reviewed with patient? Yes  SHORT TERM GOALS: Target date: 08/05/23  Pt to meet stated Foto Goal Baseline: Goal status: INITIAL  2.  Pt will tolerate walking x >15 minutes without  limitation to pain Baseline: up to 10 Goal status: INITIAL  3.  Pt will tolerate full aquatic sessions consistently without increase in pain and with improving function to demonstrate good toleration and effectiveness of intervention.  Baseline:  Goal status: MET - 07/21/23  4.  Pt will improve on Tug test to <or= 13s to demonstrate statistically significant change and improvement in lower extremity function, mobility and decreased fall risk. Baseline: 15.49 Goal status: INITIAL  5.  Pt will improve on 5 X STS test to <or= 20s to demonstrate the MCID, and improving functional lower extremity strength, transitional movements, and balance Baseline: 22.59 Goal status: INITIAL  6.  Pt will be indep with final aquatic HEP for continued management of condition and will be instructed/informed of available pool access in area Baseline: no access Goal status: INITIAL  LONG TERM GOALS: Will set as approp at re-cert   PLAN:  PT FREQUENCY: 2x/week  PT DURATION: 7 weeks  PLANNED INTERVENTIONS: 97164- PT Re-evaluation, 97110-Therapeutic exercises, 97530- Therapeutic activity, 97112- Neuromuscular re-education, 97535- Self Care, 16109- Manual therapy, [id number]- Gait training, [phone]- Orthotic Fit/training, [id number]- Aquatic Therapy, [phone]- Ionotophoresis 4mg /ml Dexamethasone, Patient/Family education, Balance training, Stair training, Taping, Dry Needling, Joint mobilization, DME instructions, Cryotherapy, and Moist heat.  PLAN FOR NEXT SESSION: Aquatics only: general strengthening with focus on core and hips.  Pain management   Mayer Camel, PTA 07/21/23 9:10 AM [facility] [address] [place], [place], [address] Phone: [phone]   Fax:  [phone]

## 2023-07-26 ENCOUNTER — Telehealth (HOSPITAL_BASED_OUTPATIENT_CLINIC_OR_DEPARTMENT_OTHER): Payer: Self-pay | Admitting: Physical Therapy

## 2023-07-26 ENCOUNTER — Ambulatory Visit (HOSPITAL_BASED_OUTPATIENT_CLINIC_OR_DEPARTMENT_OTHER): Payer: BC Managed Care – PPO | Admitting: Physical Therapy

## 2023-07-26 ENCOUNTER — Encounter (HOSPITAL_BASED_OUTPATIENT_CLINIC_OR_DEPARTMENT_OTHER): Payer: Self-pay

## 2023-07-26 NOTE — Telephone Encounter (Signed)
 Patient did not show to Aquatic PT appointment.  Called patient and spoke with her regarding missed appointment.  Patient stated that she wasn't feeling well and had forgotten appointment.  Confirmed next upcoming appointment with her.    Mayer Camel, PTA 07/26/23 8:25 AM [facility] [address] [place], [place], [address] Phone: [phone]   Fax:  [phone]

## 2023-07-28 ENCOUNTER — Ambulatory Visit (HOSPITAL_BASED_OUTPATIENT_CLINIC_OR_DEPARTMENT_OTHER): Payer: BC Managed Care – PPO | Admitting: Physical Therapy

## 2023-07-28 ENCOUNTER — Encounter (HOSPITAL_BASED_OUTPATIENT_CLINIC_OR_DEPARTMENT_OTHER): Payer: Self-pay | Admitting: Physical Therapy

## 2023-07-28 DIAGNOSIS — M6281 Muscle weakness (generalized): Secondary | ICD-10-CM

## 2023-07-28 DIAGNOSIS — R5383 Other fatigue: Secondary | ICD-10-CM

## 2023-07-28 DIAGNOSIS — M5459 Other low back pain: Secondary | ICD-10-CM

## 2023-07-28 NOTE — Therapy (Signed)
 OUTPATIENT PHYSICAL THERAPY THORACOLUMBAR TREATMENT   Patient Name: Hannah [name] MRN: [id number] DOB:03-Aug-1960, 63 y.o., female Today's Date: 07/28/2023  END OF SESSION:  PT End of Session - 07/28/23 0811     Visit Number 5    Number of Visits 12    Date for PT Re-Evaluation 08/05/23    Authorization Type BC[employer]    PT Start Time 0802    PT Stop Time 0840    PT Time Calculation (min) 38 min    Activity Tolerance Patient tolerated treatment well    Behavior During Therapy [facility] for tasks assessed/performed              Past Medical History:  Diagnosis Date   Allergy    Anemia    Anxiety    Arthritis    Asthma    Cancer (HCC)    multiple myeloma   Depression    Hypertension    Past Surgical History:  Procedure Laterality Date   ABDOMINAL HYSTERECTOMY     BICEPT TENODESIS Right 01/31/2023   Procedure: BICEPS TENODESIS;  Surgeon: Cammy Copa, MD;  Location: [facility] OR;  Service: Orthopedics;  Laterality: Right;   CHOLECYSTECTOMY  1993   COLONOSCOPY     HERNIA REPAIR     REVERSE SHOULDER ARTHROPLASTY Right 01/31/2023   Procedure: REVERSE SHOULDER ARTHROPLASTY;  Surgeon: Cammy Copa, MD;  Location: [facility] OR;  Service: Orthopedics;  Laterality: Right;   TOTAL KNEE ARTHROPLASTY Right 01/28/2016   TOTAL KNEE ARTHROPLASTY Right 01/28/2016   Procedure: RIGHT TOTAL KNEE ARTHROPLASTY;  Surgeon: Tarry Kos, MD;  Location: [facility] OR;  Service: Orthopedics;  Laterality: Right;   TRIGGER FINGER RELEASE Right 06/18/2014   Procedure: RIGHT LONG FINGER TRIGGER RELEASE;  Surgeon: Cheral Almas, MD;  Location: [facility];  Service: Orthopedics;  Laterality: Right;   TUBAL LIGATION     VAGINAL HYSTERECTOMY Bilateral 11/04/2016   Procedure: HYSTERECTOMY VAGINAL with Bilateral Salpingectomy;  Surgeon: Hal Morales, MD;  Location: [facility] ORS;  Service: Gynecology;  Laterality: Bilateral;   WEIL OSTEOTOMY Right 07/07/2020   Procedure: RIGHT FOOT WEIL OSTEOTOMY 2,  3, AND 4 METATARSALS AND PROXIMAL INTERPHALANGEAL JOINT FUSION 2 & 4 TOES;  Surgeon: Nadara Mustard, MD;  Location:  SURGERY CENTER;  Service: Orthopedics;  Laterality: Right;   Patient Active Problem List   Diagnosis Date Noted   Biceps tendonitis on right 02/11/2023   S/P reverse total shoulder arthroplasty, right 01/31/2023   Multiple myeloma in remission (HCC) 07/13/2020   Claw toe, right    Trigger finger, left middle finger 04/09/2020   Stenosing tenosynovitis of finger of left hand 02/27/2020   Hemoglobin C trait (HCC) 02/27/2018   Major depression, recurrent (HCC) 01/12/2018   Chronic right shoulder pain 12/20/2017   MDD (major depressive disorder), recurrent episode, moderate (HCC) 11/04/2017   Primary osteoarthritis of both hands 09/26/2017   Primary osteoarthritis of right shoulder 09/26/2017   Primary osteoarthritis of both hips 09/26/2017   Status post total knee replacement, right 09/26/2017   Primary osteoarthritis of both feet 09/26/2017   History of asthma 09/26/2017   Primary osteoarthritis of left knee 04/18/2017   Menorrhagia 11/04/2016   Fibroid tumor 11/04/2016   Adenomyosis 11/04/2016   Hypertension, essential 11/04/2016   Total knee replacement status 01/28/2016    PCP: Dr Guerry Bruin  REFERRING PROVIDER:   Cammy Copa, MD    REFERRING DIAG: M54.50 (ICD-10-CM) - Low back pain, unspecified back pain laterality,  unspecified chronicity, unspecified whether sciatica present   Rationale for Evaluation and Treatment: Rehabilitation  THERAPY DIAG:  Other low back pain  Fatigue, unspecified type  Muscle weakness (generalized)  ONSET DATE: 3 yrs  SUBJECTIVE:                                                                                                                                                                                           SUBJECTIVE STATEMENT: Pt reports walking through grocery stores infrequently but able to  push cart and tolerate for > 20 minutes.   POOL ACCESS:  currently none.  PERTINENT HISTORY:  Aqua therapy hip and back 2x/wk for 4-6wks lumbar spine spondylosis  -recent reverse shoulder replacement -multiple myeloma  PAIN:  Are you having pain? Yes: NPRS scale: current 4/10; Pain location:  L hip Pain description: dull Aggravating factors: prolonged positions Relieving factors: stretches  PRECAUTIONS: None  RED FLAGS: None   WEIGHT BEARING RESTRICTIONS: No  FALLS:  Has patient fallen in last 6 months? No  LIVING ENVIRONMENT: Lives with: lives with their family Lives in: House/apartment Stairs: Yes: Internal: 16 steps; on right going up Has following equipment at home: None  OCCUPATION: sititng job  PLOF: Independent  PATIENT GOALS: decrease pain  NEXT MD VISIT: post aquatic therapy  OBJECTIVE:  Note: Objective measures were completed at Evaluation unless otherwise noted.  DIAGNOSTIC FINDINGS:  04/08/23 MRI Lumbar IMPRESSION: 1. Treated myelomatous lesion in the bony pelvis. No new myelomatous lesion observed. 2. Lumbar spondylosis and degenerative disc disease, causing mild impingement at L5-S1.  PATIENT SURVEYS:  FOTO Primary score 42% with goal of 53%  COGNITION: Overall cognitive status: Within functional limits for tasks assessed     SENSATION: P&N bilateral feet  MUSCLE LENGTH:   POSTURE: No Significant postural limitations  PALPATION: No TTP  LUMBAR ROM:   WFL with pain  LOWER EXTREMITY ROM:     WFL  LOWER EXTREMITY MMT:    HD (lbs) Right eval Left eval  Hip flexion 27.2 34.2  Hip extension    Hip abduction 19.3 23.5  Hip adduction    Hip internal rotation    Hip external rotation    Knee flexion    Knee extension 42.0 41.8  Ankle dorsiflexion    Ankle plantarflexion    Ankle inversion    Ankle eversion     (Blank rows = not tested)  LUMBAR SPECIAL TESTS:  Slump test: Negative  FUNCTIONAL TESTS:  5 times sit to  stand: 22.59 TUG 15.49  07/28/23 5 x STS: 22.23 TUG: 14.36   GAIT: Distance walked: 500 ft Assistive device utilized: None  Level of assistance: Complete Independence Comments: Cadence slowed  TREATMENT    Pt seen for aquatic therapy today.  Treatment took place in water 3.5-4.75 ft in depth at the [facility] pool. Temp of water was 91.  Pt entered/exited the pool via stairs independently with bilat rail.   - unsupported: walking multiple laps forward/ backward with cues for vertical trunk and reciprocal arm swing - side stepping R/L -> with rainbow hand floats and arm addct/ abdct -> into wide squat - UE on rainbow hand floats: hip circles CW/CCW x 10 each LE; hip flex/ext x 10 - [place] - UE on wall: alternating single leg clams x 10; Leg swings into hip abdct/ addct crossing midline; relaxed squats - TrA set with rainbow hand float pull down to tap at opp side knee - straddling yellow noodle, UE on rainbow hand floats:  cycling,  hip abdct/ addct   Pt requires the buoyancy and hydrostatic pressure of water for support, and to offload joints by unweighting joint load by at least 50 % in navel deep water and by at least 75-80% in chest to neck deep water.  Viscosity of the water is needed for resistance of strengthening. Water current perturbations provides challenge to standing balance requiring increased core activation.   PATIENT EDUCATION:  Education details: reacquainting with aquatic therapy  Person educated: Patient Education method: Explanation Education comprehension: verbalized understanding  HOME EXERCISE PROGRAM: Access Code: [email or web address] URL: [email or web address] Date: 07/14/2023  * not issued yet.  Prepared by: [facility] [address] [address]   ASSESSMENT:  CLINICAL IMPRESSION: TUG and 5 x STS test completed with relatively no change.  Pt reports interest in membership here at [employer].  Encouraged pool access prior to DC  for "smooth handoff" to indep HEP.  She does reports some hip pain prior to session which stays consistent throughout but no increase.  HEP Creation continues.  She has good toleration with session with multiple rest periods.  She is able to gage well her tolerance to activity and rests when needed. Slow progress evident towards goals.     From initial evaluation:  Patient is a 63 y.o. f who was seen today for physical therapy evaluation and treatment for LBP. She has hx of multiple myeloma and is currently taking oral form of chemo on a 21 day cycle.  She reports having multiple lesions on her spine which has caused weakness.  She reports P&N in feet constantly since beginning chemo 3 years ago. She has received therapy in past but has had a decline in strength since.  Pt EDU on importance of maintaining and exercises program to retain gains made and potentially increasing.  She VU.  She will benefit from aquatic therapy intervention to improve deficits, decrease pain and return pt to optimal function/QOL. Will be assisted in finding access to a pool as able, to continue with program and/or refer to land based intervention for land based program.  OBJECTIVE IMPAIRMENTS: decreased activity tolerance, decreased endurance, decreased mobility, decreased strength, and pain.   ACTIVITY LIMITATIONS: carrying, lifting, bending, and locomotion level  PARTICIPATION LIMITATIONS: cleaning, laundry, community activity, and yard work  PERSONAL FACTORS: Behavior pattern, Past/current experiences, and 1-2 comorbidities: see PmHx  are also affecting patient's functional outcome.   REHAB POTENTIAL: Good  CLINICAL DECISION MAKING: Evolving/moderate complexity  EVALUATION COMPLEXITY: Moderate   GOALS: Goals reviewed with patient? Yes  SHORT TERM GOALS: Target date: 08/05/23  Pt to meet stated Foto Goal Baseline:  Goal status: INITIAL  2. Pt will tolerate walking x >15 minutes without limitation to  pain Baseline: up to 10 Goal status: INITIAL  3.  Pt will tolerate full aquatic sessions consistently without increase in pain and with improving function to demonstrate good toleration and effectiveness of intervention.  Baseline:  Goal status: MET - 07/21/23  4.  Pt will improve on Tug test to <or= 13s to demonstrate statistically significant change and improvement in lower extremity function, mobility and decreased fall risk. Baseline: 15.49 Goal status: INITIAL  5.  Pt will improve on 5 X STS test to <or= 20s to demonstrate the MCID, and improving functional lower extremity strength, transitional movements, and balance Baseline: 22.59 Goal status: INITIAL  6.  Pt will be indep with final aquatic HEP for continued management of condition and will be instructed/informed of available pool access in area Baseline: no access Goal status: INITIAL  LONG TERM GOALS: Will set as approp at re-cert   PLAN:  PT FREQUENCY: 2x/week  PT DURATION: 7 weeks  PLANNED INTERVENTIONS: 97164- PT Re-evaluation, 97110-Therapeutic exercises, 97530- Therapeutic activity, 97112- Neuromuscular re-education, 97535- Self Care, 16109- Manual therapy, [id number]- Gait training, [phone]- Orthotic Fit/training, [id number]- Aquatic Therapy, [phone]- Ionotophoresis 4mg /ml Dexamethasone, Patient/Family education, Balance training, Stair training, Taping, Dry Needling, Joint mobilization, DME instructions, Cryotherapy, and Moist heat.  PLAN FOR NEXT SESSION: Aquatics only: general strengthening with focus on core and hips.  Pain management   Rushie Chestnut) Devanta Daniel MPT 07/28/23 8:12 AM [facility] [address] [place], [place], [address] Phone: [phone]   Fax:  [phone]

## 2023-07-31 NOTE — Assessment & Plan Note (Signed)
 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   -bone marrow biopsy on 07/30/20 showed slightly hypercellular marrow for age and 31% plasma cells. Cytogenetics and FISH were unremarkable -she received VRD (weekly Velcade and dexamethasone, Revlimid 2 weeks on 1 week off) 08/17/20 - June 2022. She responded well. -She underwent high-dose chemo with melphalan and autologous stem cell transplant at [facility] 12/23/20 by Dr. Rosaria Ferries.  White cells engrafted 01/03/21 and platelets 01/13/21, last platelet transfusion 01/02/21 -posttreatment PET 04/06/21 showed NED  -posttreatment bone marrow biopsy 04/29/21 showed 10-20% hypocellular marrow with myeloid hypoplasia and 3% plasma cells -s/p prophylactic valacyclovir for 1 year and dapsone for 6 months posttransplant -she will continue revaccination per [facility] protocol -she began maintenance Revlimid 10 mg daily days 1-21 q. 28 days on 05/03/21. She is tolerating well with no noticeable side effects. will continue indefinitely or until disease progression. -Her last MM panel showed negative M-protein, but slightly increase Kappa light chain level in 02/2023, overall  stable  -Revlimid was briefly held around her shoulder replacement in 27 February 2023.  She has restarted.

## 2023-08-02 ENCOUNTER — Inpatient Hospital Stay (HOSPITAL_BASED_OUTPATIENT_CLINIC_OR_DEPARTMENT_OTHER): Payer: BC Managed Care – PPO | Admitting: Hematology

## 2023-08-02 ENCOUNTER — Inpatient Hospital Stay: Payer: BC Managed Care – PPO | Attending: Hematology

## 2023-08-02 ENCOUNTER — Encounter (HOSPITAL_BASED_OUTPATIENT_CLINIC_OR_DEPARTMENT_OTHER): Payer: Self-pay | Admitting: Physical Therapy

## 2023-08-02 ENCOUNTER — Ambulatory Visit (HOSPITAL_BASED_OUTPATIENT_CLINIC_OR_DEPARTMENT_OTHER): Payer: Self-pay | Attending: Orthopedic Surgery | Admitting: Physical Therapy

## 2023-08-02 VITALS — BP 136/80 | HR 101 | Temp 97.3°F | Resp 21 | Ht 65.0 in | Wt 222.1 lb

## 2023-08-02 DIAGNOSIS — C9001 Multiple myeloma in remission: Secondary | ICD-10-CM

## 2023-08-02 DIAGNOSIS — Z7961 Long term (current) use of immunomodulator: Secondary | ICD-10-CM | POA: Diagnosis not present

## 2023-08-02 DIAGNOSIS — M5459 Other low back pain: Secondary | ICD-10-CM | POA: Insufficient documentation

## 2023-08-02 DIAGNOSIS — Z9221 Personal history of antineoplastic chemotherapy: Secondary | ICD-10-CM | POA: Diagnosis not present

## 2023-08-02 DIAGNOSIS — Z923 Personal history of irradiation: Secondary | ICD-10-CM | POA: Diagnosis not present

## 2023-08-02 DIAGNOSIS — D649 Anemia, unspecified: Secondary | ICD-10-CM | POA: Diagnosis not present

## 2023-08-02 DIAGNOSIS — Z1321 Encounter for screening for nutritional disorder: Secondary | ICD-10-CM | POA: Diagnosis not present

## 2023-08-02 DIAGNOSIS — G8929 Other chronic pain: Secondary | ICD-10-CM | POA: Insufficient documentation

## 2023-08-02 DIAGNOSIS — M6281 Muscle weakness (generalized): Secondary | ICD-10-CM | POA: Insufficient documentation

## 2023-08-02 DIAGNOSIS — M1990 Unspecified osteoarthritis, unspecified site: Secondary | ICD-10-CM | POA: Insufficient documentation

## 2023-08-02 DIAGNOSIS — M25511 Pain in right shoulder: Secondary | ICD-10-CM | POA: Insufficient documentation

## 2023-08-02 DIAGNOSIS — C9000 Multiple myeloma not having achieved remission: Secondary | ICD-10-CM

## 2023-08-02 DIAGNOSIS — R5383 Other fatigue: Secondary | ICD-10-CM | POA: Insufficient documentation

## 2023-08-02 LAB — CMP (CANCER CENTER ONLY)
ALT: 8 U/L (ref 0–44)
AST: 12 U/L — ABNORMAL LOW (ref 15–41)
Albumin: 3.9 g/dL (ref 3.5–5.0)
Alkaline Phosphatase: 79 U/L (ref 38–126)
Anion gap: 6 (ref 5–15)
BUN: 15 mg/dL (ref 6–20)
CO2: 29 mmol/L (ref 22–32)
Calcium: 8.7 mg/dL — ABNORMAL LOW (ref 8.9–10.3)
Chloride: 106 mmol/L (ref 98–111)
Creatinine: 0.81 mg/dL (ref 0.44–1.00)
GFR, Estimated: >60 mL/min (ref >60)
Glucose, Bld: 119 mg/dL — ABNORMAL HIGH (ref 70–99)
Potassium: 3.5 mmol/L (ref 3.5–5.1)
Sodium: 141 mmol/L (ref 135–145)
Total Bilirubin: 0.5 mg/dL (ref 0.0–1.2)
Total Protein: 7 g/dL (ref 6.5–8.1)

## 2023-08-02 LAB — CBC WITH DIFFERENTIAL (CANCER CENTER ONLY)
Abs Immature Granulocytes: 0.03 10*3/uL (ref 0.00–0.07)
Basophils Absolute: 0 10*3/uL (ref 0.0–0.1)
Basophils Relative: 1 %
Eosinophils Absolute: 0.2 10*3/uL (ref 0.0–0.5)
Eosinophils Relative: 5 %
HCT: 34.2 % — ABNORMAL LOW (ref 36.0–46.0)
Hemoglobin: 11.3 g/dL — ABNORMAL LOW (ref 12.0–15.0)
Immature Granulocytes: 1 %
Lymphocytes Relative: 28 %
Lymphs Abs: 1.2 10*3/uL (ref 0.7–4.0)
MCH: 24.4 pg — ABNORMAL LOW (ref 26.0–34.0)
MCHC: 33 g/dL (ref 30.0–36.0)
MCV: 73.7 fL — ABNORMAL LOW (ref 80.0–100.0)
Monocytes Absolute: 0.2 10*3/uL (ref 0.1–1.0)
Monocytes Relative: 5 %
Neutro Abs: 2.7 10*3/uL (ref 1.7–7.7)
Neutrophils Relative %: 60 %
Platelet Count: 199 10*3/uL (ref 150–400)
RBC: 4.64 MIL/uL (ref 3.87–5.11)
RDW: 17.4 % — ABNORMAL HIGH (ref 11.5–15.5)
WBC Count: 4.4 10*3/uL (ref 4.0–10.5)
nRBC: 0 % (ref 0.0–0.2)

## 2023-08-02 NOTE — Progress Notes (Signed)
 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   -bone marrow biopsy on 07/30/20 showed slightly hypercellular marrow for age and 31% plasma cells. Cytogenetics and FISH were unremarkable -she received VRD (weekly Velcade and dexamethasone, Revlimid 2 weeks on 1 week off) 08/17/20 - June 2022. She responded well. -She underwent high-dose chemo with melphalan and autologous stem cell transplant at [facility] 12/23/20 by Dr. Rosaria Ferries.  White cells engrafted 01/03/21 and platelets 01/13/21, last platelet transfusion 01/02/21 -posttreatment PET 04/06/21 showed NED  -posttreatment bone marrow biopsy 04/29/21 showed 10-20% hypocellular marrow with myeloid hypoplasia and 3% plasma cells -s/p prophylactic valacyclovir for 1 year and dapsone for 6 months posttransplant -she will continue revaccination per [facility] protocol -she began maintenance  Revlimid 10 mg daily days 1-21 q. 28 days on 05/03/21. She is tolerating well with no noticeable side effects. will continue indefinitely or until disease progression. -Her last MM panel showed negative M-protein, but slightly increase Kappa light chain level in 02/2023, overall  stable  -Revlimid was briefly held around her shoulder replacement in 27 February 2023.  She has restarted.   Assessment and Plan    Multiple Myeloma In remission based on labs from three months ago. Currently on Revlimid, first week of the cycle. No new bone pain. Mild anemia likely due to Revlimid. Slightly low calcium levels. No disease progression. Discussed increased risk of secondary cancers with Revlimid and the need for regular cancer screenings, including annual mammograms, Pap smears, and colonoscopies. - Continue Revlimid - Monitor blood counts regularly - Order vitamin D level next visit - Recommend over-the-counter vitamin D (1000-2000 IU daily) - Encourage weight-bearing exercise - Refer to [facility] for annual follow-up and vaccines - Schedule follow-up in six months  Osteoarthritis Reports normal aches and pains. Engaging in water therapy and plans to resume walking and weight-bearing exercises. - Continue water therapy - Encourage weight-bearing exercises and walking when weather permits  General Health Maintenance Due for a colonoscopy. Last colonoscopy in 2015. Mammograms are up to date, performed annually at the beginning of the year. - Refer for colonoscopy - Ensure annual mammogram is performed  Dental Procedure Clearance Requires dental work for a chipped tooth and old filling replacement. Last dose of Zemaira was over a year ago, no contraindications for dental work. - Clear for dental procedure  Plan -Lab reviewed, stable -Continue maintenance Revlimid - Schedule follow-up in six months, she will call her hematologist at [facility] for follow-up in 3 months with lab -Refer  her  back to [facility] for screening colonoscopy         SUMMARY OF ONCOLOGIC HISTORY: Oncology History Overview Note  Cancer Staging Multiple myeloma (HCC) Staging form: Plasma Cell Myeloma and Plasma Cell Disorders, AJCC 8th Edition - Clinical stage from 07/20/2020: Beta-2-microglobulin (mg/L): 2.4, Albumin (g/dL): 3.2, ISS: Stage II, High-risk cytogenetics: Unknown, LDH: Normal - Signed by Malachy Mood, MD on 08/02/2020 Beta 2 microglobulin range (mg/L): Less than 3.5 Albumin range (g/dL): Less than 3.5 Cytogenetics: Unknown    Multiple myeloma in remission (HCC)  07/03/2020 Imaging   MRI Lumbar Spine  IMPRESSION: 1. Numerous T1 hypointense and STIR hyperintense lesions throughout the visualized spine and sacrum with multiple areas of extraosseous extension in the sacrum, detailed above and compatible with multiple myeloma given the clinical history. 2. An MRI of the pelvis with contrast could evaluate the full extent of the partially imaged sacral lesions, including left S1-S2 neural foraminal involvement and suspected involvement of the exited left L5 nerve. 3. Multilevel degenerative change without significant canal or foraminal stenosis in the lumbar spine.   07/13/2020 Initial Diagnosis   Multiple myeloma (HCC)   07/20/2020 Cancer Staging   Staging form: Plasma Cell Myeloma and Plasma Cell Disorders, AJCC 8th Edition - Clinical stage from 07/20/2020: Beta-2-microglobulin (mg/L): 2.4, Albumin (g/dL): 3.2, ISS: Stage II, High-risk cytogenetics: Unknown, LDH: Normal - Signed by Malachy Mood, MD on 03/09/2022 Stage prefix: Initial diagnosis Beta 2 microglobulin range (mg/L): Less than 3.5 Albumin range (g/dL): Less than 3.5 Cytogenetics: No abnormalities Bone disease on imaging: Present   07/24/2020 PET scan   IMPRESSION: 1. Moderate to high hypermetabolic activity associated with the expansile soft tissue masses within the LEFT and RIGHT pelvic bones is consistent with active  multiple myeloma / plasmacytoma. 2. Lytic lesion in the LEFT scapula with mild metabolic activity is indeterminate. 3. Metabolic activity associated with the RIGHT knee prosthetic and RIGHT distal foot fusion is favored post procedural inflammation.     07/30/2020 - 08/12/2020 Radiation Therapy   Palliative Radiation to Pelvic lesions with Dr Mitzi Hansen    07/30/2020 Pathology Results   DIAGNOSIS:   BONE MARROW, ASPIRATE, CLOT, CORE:  -Normocellular to slightly hypercellular bone marrow for age with  plasmacytosis  -See comment   PERIPHERAL BLOOD:  -Microcytic-normochromic anemia   COMMENT:   The bone marrow shows increased number of atypical plasma cells  representing 31% of all cells in the aspirate associated with prominent  interstitial infiltrates and numerous variably sized clusters in the  clot and biopsy sections.  The findings are most consistent with  persistent/recurrent/previously known plasma cell neoplasm.  For  completeness, immunohistochemical stain for CD138 and in situ  hybridization for kappa and lambda will be performed and the results  reported in an addendum.  Correlation with cytogenetic and FISH studies  is recommended.   08/03/2020 -  Chemotherapy   Zometa q4weeks starting 08/03/20    08/17/2020 - 11/29/2020 Chemotherapy   Velcade weekly, Revlimid, dex 20mg  weekly (VRd)  Starting 08/17/20. Last dose Velcade 11/23/20 and last dose revlimid on 11/29/20       Discussed the use of AI scribe software for clinical note transcription with the patient, who gave verbal consent to proceed.  History of Present Illness   The patient, a 63 year old female with a history of multiple myeloma, presents for a routine follow-up visit. She reports experiencing the usual aches and pains associated with her arthritis, but denies any new bone pain. She is currently on  her first week of a new cycle of Revlimid, a medication for her multiple myeloma, and reports no problems or side effects  from the medication. She is diligent about using her chart to monitor her health and would have reported any significant changes.  She is unsure if she has done a Cologuard or stool test for colon cancer screening, but believes it may have been ordered by her primary care physician.  She has informed her dentist that she needs to check with her oncologist before any major dental work is done.  She is unsure if she is currently taking vitamin D supplements, but has taken high-dose supplements in the past.  The patient is also engaging in weight-bearing exercises to manage her multiple myeloma, including water therapy and walking. [facility].         All other systems were reviewed with the patient and are negative.  MEDICAL HISTORY:  Past Medical History:  Diagnosis Date   Allergy    Anemia    Anxiety    Arthritis    Asthma    Cancer (HCC)    multiple myeloma   Depression    Hypertension     SURGICAL HISTORY: Past Surgical History:  Procedure Laterality Date   ABDOMINAL HYSTERECTOMY     BICEPT TENODESIS Right 01/31/2023   Procedure: BICEPS TENODESIS;  Surgeon: Cammy Copa, MD;  Location: [facility] OR;  Service: Orthopedics;  Laterality: Right;   CHOLECYSTECTOMY  1993   COLONOSCOPY     HERNIA REPAIR     REVERSE SHOULDER ARTHROPLASTY Right 01/31/2023   Procedure: REVERSE SHOULDER ARTHROPLASTY;  Surgeon: Cammy Copa, MD;  Location: [facility] OR;  Service: Orthopedics;  Laterality: Right;   TOTAL KNEE ARTHROPLASTY Right 01/28/2016   TOTAL KNEE ARTHROPLASTY Right 01/28/2016   Procedure: RIGHT TOTAL KNEE ARTHROPLASTY;  Surgeon: Tarry Kos, MD;  Location: [facility] OR;  Service: Orthopedics;  Laterality: Right;   TRIGGER FINGER RELEASE Right 06/18/2014   Procedure: RIGHT LONG FINGER TRIGGER RELEASE;  Surgeon: Cheral Almas, MD;  Location: [facility];  Service: Orthopedics;  Laterality: Right;    TUBAL LIGATION     VAGINAL HYSTERECTOMY Bilateral 11/04/2016   Procedure: HYSTERECTOMY VAGINAL with Bilateral Salpingectomy;  Surgeon: Hal Morales, MD;  Location: [facility] ORS;  Service: Gynecology;  Laterality: Bilateral;   WEIL OSTEOTOMY Right 07/07/2020   Procedure: RIGHT FOOT WEIL OSTEOTOMY 2, 3, AND 4 METATARSALS AND PROXIMAL INTERPHALANGEAL JOINT FUSION 2 & 4 TOES;  Surgeon: Nadara Mustard, MD;  Location: [facility];  Service: Orthopedics;  Laterality: Right;    I have reviewed the social history and family history with the patient and they are unchanged from previous note.  ALLERGIES:  is allergic to elemental sulfur and sulfa antibiotics.  MEDICATIONS:  Current Outpatient Medications  Medication Sig Dispense Refill   aspirin EC 81 MG tablet Take 81 mg by mouth daily. Swallow whole.     calcium carbonate (OSCAL) 1500 (600 Ca) MG TABS tablet Take 600 mg of elemental calcium by mouth daily.     Cholecalciferol (VITAMIN D3) 1.25 MG (50000 UT) CAPS Take 1 capsule by mouth every Sunday.     docusate sodium (COLACE) 100 MG capsule Take 1 capsule (100 mg total) by mouth 2 (two) times daily. 10 capsule 0   ferrous sulfate 325 (65 FE) MG tablet Take 325 mg by mouth 2 (two) times daily with a  meal.     lenalidomide (REVLIMID) 10 MG capsule Take 1 capsule (10 mg total) by mouth daily. [employer] Auth # [id number]  Date Obtained 06/29/2023 TAKE 1 CAPSULE BY MOUTH 1 TIME A DAY FOR 21 DAYS ON THEN 7 DAYS OFF for a 28-day cycle 21 capsule 0   methocarbamol (ROBAXIN) 500 MG tablet Take 1 tablet (500 mg total) by mouth every 8 (eight) hours as needed for muscle spasms. 30 tablet 0   naproxen (NAPROSYN) 250 MG tablet Take 1 tablet (250 mg total) by mouth 2 (two) times daily with a meal. 30 tablet 0   oxyCODONE (OXY IR/ROXICODONE) 5 MG immediate release tablet Take 1 tablet (5 mg total) by mouth every 4 (four) hours as needed for moderate pain (pain score 4-6). 30 tablet 0   sertraline (ZOLOFT) 50  MG tablet Take 50 mg by mouth daily.     valACYclovir (VALTREX) 500 MG tablet Take 500 mg by mouth 2 (two) times daily.     No current facility-administered medications for this visit.    PHYSICAL EXAMINATION: ECOG PERFORMANCE STATUS: 0 - Asymptomatic  Vitals:   08/02/23 0859  BP: 136/80  Pulse: (!) 101  Resp: (!) 21  Temp: (!) 97.3 F (36.3 C)  SpO2: 99%   Wt Readings from Last 3 Encounters:  08/02/23 222 lb 1.6 oz (100.7 kg)  05/03/23 221 lb 14.4 oz (100.7 kg)  03/06/23 226 lb 4.8 oz (102.6 kg)     GENERAL:alert, no distress and comfortable SKIN: skin color, texture, turgor are normal, no rashes or significant lesions EYES: normal, Conjunctiva are pink and non-injected, sclera clear NECK: supple, thyroid normal size, non-tender, without nodularity LYMPH:  no palpable lymphadenopathy in the cervical, axillary  LUNGS: clear to auscultation and percussion with normal breathing effort HEART: regular rate & rhythm and no murmurs and no lower extremity edema ABDOMEN:abdomen soft, non-tender and normal bowel sounds Musculoskeletal:no cyanosis of digits and no clubbing  NEURO: alert & oriented x 3 with fluent speech, no focal motor/sensory deficits  Physical Exam          LABORATORY DATA:  I have reviewed the data as listed    Latest Ref Rng & Units 08/02/2023    8:39 AM 05/03/2023    8:17 AM 03/06/2023    8:41 AM  CBC  WBC 4.0 - 10.5 K/uL 4.4  6.4  5.4   Hemoglobin 12.0 - 15.0 g/dL 54.0  98.1  19.1   Hematocrit 36.0 - 46.0 % 34.2  37.9  31.6   Platelets 150 - 400 K/uL 199  181  163         Latest Ref Rng & Units 08/02/2023    8:39 AM 05/03/2023    8:17 AM 03/06/2023    8:41 AM  CMP  Glucose 70 - 99 mg/dL 478  295  91   BUN 6 - 20 mg/dL 15  9  13    Creatinine 0.44 - 1.00 mg/dL 6.21  3.08  6.57   Sodium 135 - 145 mmol/L 141  141  142   Potassium 3.5 - 5.1 mmol/L 3.5  4.1  3.9   Chloride 98 - 111 mmol/L 106  108  107   CO2 22 - 32 mmol/L 29  28  31    Calcium 8.9 -  10.3 mg/dL 8.7  9.1  8.8   Total Protein 6.5 - 8.1 g/dL 7.0  7.3  6.6   Total Bilirubin 0.0 - 1.2 mg/dL 0.5  0.5  0.4  Alkaline Phos 38 - 126 U/L 79  78  77   AST 15 - 41 U/L 12  12  14    ALT 0 - 44 U/L 8  10  10        RADIOGRAPHIC STUDIES: I have personally reviewed the radiological images as listed and agreed with the findings in the report. No results found.    Orders Placed This Encounter  Procedures   Vitamin D 25 hydroxy    Standing Status:   Future    Expected Date:   11/02/2023    Expiration Date:   08/01/2024   Ambulatory referral to Gastroenterology    Referral Priority:   Routine    Referral Type:   Consultation    Referral Reason:   Specialty Services Required    Number of Visits Requested:   1   All questions were answered. The patient knows to call the clinic with any problems, questions or concerns. No barriers to learning was detected. The total time spent in the appointment was 25 minutes.     Malachy Mood, MD 08/02/2023

## 2023-08-02 NOTE — Therapy (Signed)
 OUTPATIENT PHYSICAL THERAPY THORACOLUMBAR TREATMENT           Re-Cert  Patient Name: Hannah [name] MRN: [id number] DOB:11/15/1960, 63 y.o., female Today's Date: 08/02/2023  END OF SESSION:  PT End of Session - 08/02/23 1007     Visit Number 6    Number of Visits 12    Date for PT Re-Evaluation 08/18/23    Authorization Type BCBS    PT Start Time 0722    PT Stop Time 0800    PT Time Calculation (min) 38 min    Activity Tolerance Patient tolerated treatment well    Behavior During Therapy [facility] for tasks assessed/performed               Past Medical History:  Diagnosis Date   Allergy    Anemia    Anxiety    Arthritis    Asthma    Cancer (HCC)    multiple myeloma   Depression    Hypertension    Past Surgical History:  Procedure Laterality Date   ABDOMINAL HYSTERECTOMY     BICEPT TENODESIS Right 01/31/2023   Procedure: BICEPS TENODESIS;  Surgeon: Cammy Copa, MD;  Location: [facility] OR;  Service: Orthopedics;  Laterality: Right;   CHOLECYSTECTOMY  1993   COLONOSCOPY     HERNIA REPAIR     REVERSE SHOULDER ARTHROPLASTY Right 01/31/2023   Procedure: REVERSE SHOULDER ARTHROPLASTY;  Surgeon: Cammy Copa, MD;  Location: [facility] OR;  Service: Orthopedics;  Laterality: Right;   TOTAL KNEE ARTHROPLASTY Right 01/28/2016   TOTAL KNEE ARTHROPLASTY Right 01/28/2016   Procedure: RIGHT TOTAL KNEE ARTHROPLASTY;  Surgeon: Tarry Kos, MD;  Location: [facility] OR;  Service: Orthopedics;  Laterality: Right;   TRIGGER FINGER RELEASE Right 06/18/2014   Procedure: RIGHT LONG FINGER TRIGGER RELEASE;  Surgeon: Cheral Almas, MD;  Location: [facility];  Service: Orthopedics;  Laterality: Right;   TUBAL LIGATION     VAGINAL HYSTERECTOMY Bilateral 11/04/2016   Procedure: HYSTERECTOMY VAGINAL with Bilateral Salpingectomy;  Surgeon: Hal Morales, MD;  Location: [facility];  Service: Gynecology;  Laterality: Bilateral;   WEIL OSTEOTOMY Right 07/07/2020   Procedure: RIGHT FOOT  WEIL OSTEOTOMY 2, 3, AND 4 METATARSALS AND PROXIMAL INTERPHALANGEAL JOINT FUSION 2 & 4 TOES;  Surgeon: Nadara Mustard, MD;  Location: [facility];  Service: Orthopedics;  Laterality: Right;   Patient Active Problem List   Diagnosis Date Noted   Biceps tendonitis on right 02/11/2023   S/P reverse total shoulder arthroplasty, right 01/31/2023   Multiple myeloma in remission (HCC) 07/13/2020   Claw toe, right    Trigger finger, left middle finger 04/09/2020   Stenosing tenosynovitis of finger of left hand 02/27/2020   Hemoglobin C trait (HCC) 02/27/2018   Major depression, recurrent (HCC) 01/12/2018   Chronic right shoulder pain 12/20/2017   MDD (major depressive disorder), recurrent episode, moderate (HCC) 11/04/2017   Primary osteoarthritis of both hands 09/26/2017   Primary osteoarthritis of right shoulder 09/26/2017   Primary osteoarthritis of both hips 09/26/2017   Status post total knee replacement, right 09/26/2017   Primary osteoarthritis of both feet 09/26/2017   History of asthma 09/26/2017   Primary osteoarthritis of left knee 04/18/2017   Menorrhagia 11/04/2016   Fibroid tumor 11/04/2016   Adenomyosis 11/04/2016   Hypertension, essential 11/04/2016   Total knee replacement status 01/28/2016    PCP: Dr Guerry Bruin  REFERRING PROVIDER:   Cammy Copa, MD    REFERRING  DIAG: M54.50 (ICD-10-CM) - Low back pain, unspecified back pain laterality, unspecified chronicity, unspecified whether sciatica present   Rationale for Evaluation and Treatment: Rehabilitation  THERAPY DIAG:  Other low back pain - Plan: PT plan of care cert/re-cert  Fatigue, unspecified type - Plan: PT plan of care cert/re-cert  ONSET DATE: 3 yrs  SUBJECTIVE:                                                                                                                                                                                           SUBJECTIVE STATEMENT: "A little  sore today"  POOL ACCESS:  currently none.  PERTINENT HISTORY:  Aqua therapy hip and back 2x/wk for 4-6wks lumbar spine spondylosis  -recent reverse shoulder replacement -multiple myeloma  PAIN:  Are you having pain? Yes: NPRS scale: current 5/10; Pain location:  L hip Pain description: dull Aggravating factors: prolonged positions Relieving factors: stretches  PRECAUTIONS: None  RED FLAGS: None   WEIGHT BEARING RESTRICTIONS: No  FALLS:  Has patient fallen in last 6 months? No  LIVING ENVIRONMENT: Lives with: lives with their family Lives in: House/apartment Stairs: Yes: Internal: 16 steps; on right going up Has following equipment at home: None  OCCUPATION: sititng job  PLOF: Independent  PATIENT GOALS: decrease pain  NEXT MD VISIT: post aquatic therapy  OBJECTIVE:  Note: Objective measures were completed at Evaluation unless otherwise noted.  DIAGNOSTIC FINDINGS:  04/08/23 MRI Lumbar IMPRESSION: 1. Treated myelomatous lesion in the bony pelvis. No new myelomatous lesion observed. 2. Lumbar spondylosis and degenerative disc disease, causing mild impingement at L5-S1.  PATIENT SURVEYS:  FOTO Primary score 42% with goal of 53%  COGNITION: Overall cognitive status: Within functional limits for tasks assessed     SENSATION: P&N bilateral feet  MUSCLE LENGTH:   POSTURE: No Significant postural limitations  PALPATION: No TTP  LUMBAR ROM:   WFL with pain  LOWER EXTREMITY ROM:     WFL  LOWER EXTREMITY MMT:    HD (lbs) Right eval Left eval  Hip flexion 27.2 34.2  Hip extension    Hip abduction 19.3 23.5  Hip adduction    Hip internal rotation    Hip external rotation    Knee flexion    Knee extension 42.0 41.8  Ankle dorsiflexion    Ankle plantarflexion    Ankle inversion    Ankle eversion     (Blank rows = not tested)  LUMBAR SPECIAL TESTS:  Slump test: Negative  FUNCTIONAL TESTS:  5 times sit to stand: 22.59 TUG  15.49  07/28/23 5 x STS: 22.23 TUG: 14.36   GAIT: Distance walked:  500 ft Assistive device utilized: None Level of assistance: Complete Independence Comments: Cadence slowed  TREATMENT    Pt seen for aquatic therapy today.  Treatment took place in water 3.5-4.75 ft in depth at the [facility] pool. Temp of water was 91.  Pt entered/exited the pool via stairs independently with bilat rail.   - unsupported: walking multiple laps forward/ backward with cues for vertical trunk and reciprocal arm swing - side stepping R/L -> with rainbow hand floats and arm addct/ abdct -> into wide squat - UE on rainbow hand floats: hip circles CW/CCW x 10 each LE; hip flex/ext x 10 - [place] - UE on wall: alternating single leg clams x 10; 1/2 diamond; reverse 1/2 diamond; Leg swings into hip abdct/ addct crossing midline; relaxed squats - TrA set with rainbow hand float pull down to tap at opp side knee - straddling yellow noodle, UE on rainbow hand floats:  cycling,  hip abdct/ addct   Pt requires the buoyancy and hydrostatic pressure of water for support, and to offload joints by unweighting joint load by at least 50 % in navel deep water and by at least 75-80% in chest to neck deep water.  Viscosity of the water is needed for resistance of strengthening. Water current perturbations provides challenge to standing balance requiring increased core activation.   PATIENT EDUCATION:  Education details: reacquainting with aquatic therapy  Person educated: Patient Education method: Explanation Education comprehension: verbalized understanding  HOME EXERCISE PROGRAM: Access Code: [email or web address] URL: [email or web address] Date: 08/02/2023 Prepared by: Geni Bers  Exercises - Walking March  - 3 x weekly - Side Stepping with Hand Floats  - 3 x weekly - Hand Buoy Carry  - 1 x daily - 1-3 x weekly - Single Leg Clam - hold wall or hand floats  - 1 x daily - 1-3 x weekly - 1-2  sets - 10 reps - Standing Hip Abduction  - 1 x daily - 1-3 x weekly - 1-2 sets - 10 reps - Standing Hip Circles at [employer]  - 1 x daily - 7 x weekly - 3 sets - 10 reps - Side to Side Hamstring Stretch with Noodle at [employer]  - 1 x daily - 1-3 x weekly - 1 sets - 3 reps - Noodle Press  - 1 x daily - 1-3 x weekly - 1-2 sets - 10 reps - Drawing Bow  - 1 x daily - 1-3 x weekly - 1-2 sets - 10 reps - Seated Straddle on Flotation Forward Breast Stroke Arms and Bicycle Legs  - 1 x daily - 1-3 x weekly   ASSESSMENT:  CLINICAL IMPRESSION: Pt with improved toleration to activity.  She has a positive outlook and great attitude during todays treatment. HEP is completed and printed but not yet instructed.  Goals ongoing.    Re-cert TUG and 5 x STS test completed with relatively no change.  Pt reports interest in membership here at [facility] [employer].  Encouraged pool access prior to DC for "smooth handoff" to indep HEP.  She does reports some hip pain prior to session which stays consistent throughout but no increase.  HEP Creation continues.  She has good toleration with session with multiple rest periods.  She is able to gage well her tolerance to activity and rests when needed. Slow progress evident towards goals. Will extend cert through 2 more weeks to finish with HEP creation and instruction and to progress towards all goals.  From initial evaluation:  Patient is a 63 y.o. f who was seen today for physical therapy evaluation and treatment for LBP. She has hx of multiple myeloma and is currently taking oral form of chemo on a 21 day cycle.  She reports having multiple lesions on her spine which has caused weakness.  She reports P&N in feet constantly since beginning chemo 3 years ago. She has received therapy in past but has had a decline in strength since.  Pt EDU on importance of maintaining and exercises program to retain gains made and potentially increasing.  She VU.  She will benefit from aquatic  therapy intervention to improve deficits, decrease pain and return pt to optimal function/QOL. Will be assisted in finding access to a pool as able, to continue with program and/or refer to land based intervention for land based program.  OBJECTIVE IMPAIRMENTS: decreased activity tolerance, decreased endurance, decreased mobility, decreased strength, and pain.   ACTIVITY LIMITATIONS: carrying, lifting, bending, and locomotion level  PARTICIPATION LIMITATIONS: cleaning, laundry, community activity, and yard work  PERSONAL FACTORS: Behavior pattern, Past/current experiences, and 1-2 comorbidities: see PmHx  are also affecting patient's functional outcome.   REHAB POTENTIAL: Good  CLINICAL DECISION MAKING: Evolving/moderate complexity  EVALUATION COMPLEXITY: Moderate   GOALS: Goals reviewed with patient? Yes  SHORT TERM GOALS: Target date: 08/18/23  Pt to meet stated Foto Goal Baseline: Goal status: INITIAL  2. Pt will tolerate walking x >15 minutes without limitation to pain Baseline: up to 10 Goal status: In progress 08/02/23  3.  Pt will tolerate full aquatic sessions consistently without increase in pain and with improving function to demonstrate good toleration and effectiveness of intervention.  Baseline:  Goal status: MET - 07/21/23  4.  Pt will improve on Tug test to <or= 13s to demonstrate statistically significant change and improvement in lower extremity function, mobility and decreased fall risk. Baseline: 15.49 Goal status: In progress 08/02/23  5.  Pt will improve on 5 X STS test to <or= 20s to demonstrate the MCID, and improving functional lower extremity strength, transitional movements, and balance Baseline: 22.59 Goal status: In progress 08/02/23  6.  Pt will be indep with final aquatic HEP for continued management of condition and will be instructed/informed of available pool access in area Baseline: no access Goal status: In Process 08/02/23  LONG TERM GOALS: Will  set as approp at re-cert   PLAN:  PT FREQUENCY:1-2 x week  PT DURATION: 2 weeks  PLANNED INTERVENTIONS: 97164- PT Re-evaluation, 97110-Therapeutic exercises, 97530- Therapeutic activity, 97112- Neuromuscular re-education, 97535- Self Care, 16109- Manual therapy, [phone]- Gait training, [phone]- Orthotic Fit/training, [phone]- Aquatic Therapy, [phone]- Ionotophoresis 4mg /ml Dexamethasone, Patient/Family education, Balance training, Stair training, Taping, Dry Needling, Joint mobilization, DME instructions, Cryotherapy, and Moist heat.  PLAN FOR NEXT SESSION: Aquatics only: general strengthening with focus on core and hips.  Pain management   Rushie Chestnut) Irmgard Rampersaud MPT 08/02/23 10:16 AM [facility] [address] [place], [place], [address] Phone: [phone]   Fax:  [phone]

## 2023-08-03 ENCOUNTER — Other Ambulatory Visit: Payer: Self-pay

## 2023-08-03 ENCOUNTER — Other Ambulatory Visit: Payer: Self-pay | Admitting: Hematology

## 2023-08-03 LAB — KAPPA/LAMBDA LIGHT CHAINS
Kappa free light chain: 29.8 mg/L — ABNORMAL HIGH (ref 3.3–19.4)
Kappa, lambda light chain ratio: 1.94 — ABNORMAL HIGH (ref 0.26–1.65)
Lambda free light chains: 15.4 mg/L (ref 5.7–26.3)

## 2023-08-03 MED ORDER — LENALIDOMIDE 10 MG PO CAPS
10.0000 mg | ORAL_CAPSULE | Freq: Every day | ORAL | 0 refills | Status: DC
Start: 2023-08-03 — End: 2023-09-04

## 2023-08-04 ENCOUNTER — Encounter (HOSPITAL_BASED_OUTPATIENT_CLINIC_OR_DEPARTMENT_OTHER): Payer: Self-pay | Admitting: Physical Therapy

## 2023-08-04 ENCOUNTER — Ambulatory Visit (HOSPITAL_BASED_OUTPATIENT_CLINIC_OR_DEPARTMENT_OTHER): Payer: Self-pay | Admitting: Physical Therapy

## 2023-08-04 DIAGNOSIS — G8929 Other chronic pain: Secondary | ICD-10-CM

## 2023-08-04 DIAGNOSIS — M6281 Muscle weakness (generalized): Secondary | ICD-10-CM

## 2023-08-04 DIAGNOSIS — M25511 Pain in right shoulder: Secondary | ICD-10-CM | POA: Diagnosis not present

## 2023-08-04 DIAGNOSIS — M5459 Other low back pain: Secondary | ICD-10-CM

## 2023-08-04 DIAGNOSIS — R5383 Other fatigue: Secondary | ICD-10-CM

## 2023-08-04 NOTE — Therapy (Signed)
 OUTPATIENT PHYSICAL THERAPY THORACOLUMBAR TREATMENT             Patient Name: Hannah [name] MRN: [id number] DOB:[birth year], 63 y.o., female Today's Date: 08/04/2023  END OF SESSION:  PT End of Session - 08/04/23 0818     Visit Number 7    Number of Visits 12    Date for PT Re-Evaluation 08/18/23    Authorization Type BCBS    PT Start Time 0804    PT Stop Time 0842    PT Time Calculation (min) 38 min    Behavior During Therapy [facility] for tasks assessed/performed               Past Medical History:  Diagnosis Date   Allergy    Anemia    Anxiety    Arthritis    Asthma    Cancer (HCC)    multiple myeloma   Depression    Hypertension    Past Surgical History:  Procedure Laterality Date   ABDOMINAL HYSTERECTOMY     BICEPT TENODESIS Right 01/31/2023   Procedure: BICEPS TENODESIS;  Surgeon: Cammy Copa, MD;  Location: [facility] OR;  Service: Orthopedics;  Laterality: Right;   CHOLECYSTECTOMY  1993   COLONOSCOPY     HERNIA REPAIR     REVERSE SHOULDER ARTHROPLASTY Right 01/31/2023   Procedure: REVERSE SHOULDER ARTHROPLASTY;  Surgeon: Cammy Copa, MD;  Location: [facility] OR;  Service: Orthopedics;  Laterality: Right;   TOTAL KNEE ARTHROPLASTY Right 01/28/2016   TOTAL KNEE ARTHROPLASTY Right 01/28/2016   Procedure: RIGHT TOTAL KNEE ARTHROPLASTY;  Surgeon: Tarry Kos, MD;  Location: [facility] OR;  Service: Orthopedics;  Laterality: Right;   TRIGGER FINGER RELEASE Right 06/18/2014   Procedure: RIGHT LONG FINGER TRIGGER RELEASE;  Surgeon: Cheral Almas, MD;  Location: [facility];  Service: Orthopedics;  Laterality: Right;   TUBAL LIGATION     VAGINAL HYSTERECTOMY Bilateral 11/04/2016   Procedure: HYSTERECTOMY VAGINAL with Bilateral Salpingectomy;  Surgeon: Hal Morales, MD;  Location: [facility];  Service: Gynecology;  Laterality: Bilateral;   WEIL OSTEOTOMY Right 07/07/2020   Procedure: RIGHT FOOT WEIL OSTEOTOMY 2, 3, AND 4 METATARSALS AND PROXIMAL  INTERPHALANGEAL JOINT FUSION 2 & 4 TOES;  Surgeon: Nadara Mustard, MD;  Location:  SURGERY CENTER;  Service: Orthopedics;  Laterality: Right;   Patient Active Problem List   Diagnosis Date Noted   Biceps tendonitis on right 02/11/2023   S/P reverse total shoulder arthroplasty, right 01/31/2023   Multiple myeloma in remission (HCC) 07/13/2020   Claw toe, right    Trigger finger, left middle finger 04/09/2020   Stenosing tenosynovitis of finger of left hand 02/27/2020   Hemoglobin C trait (HCC) 02/27/2018   Major depression, recurrent (HCC) 01/12/2018   Chronic right shoulder pain 12/20/2017   MDD (major depressive disorder), recurrent episode, moderate (HCC) 11/04/2017   Primary osteoarthritis of both hands 09/26/2017   Primary osteoarthritis of right shoulder 09/26/2017   Primary osteoarthritis of both hips 09/26/2017   Status post total knee replacement, right 09/26/2017   Primary osteoarthritis of both feet 09/26/2017   History of asthma 09/26/2017   Primary osteoarthritis of left knee 04/18/2017   Menorrhagia 11/04/2016   Fibroid tumor 11/04/2016   Adenomyosis 11/04/2016   Hypertension, essential 11/04/2016   Total knee replacement status 01/28/2016    PCP: Dr Guerry Bruin  REFERRING PROVIDER:   Cammy Copa, MD    REFERRING DIAG: M54.50 (ICD-10-CM) - Low back pain, unspecified back  pain laterality, unspecified chronicity, unspecified whether sciatica present   Rationale for Evaluation and Treatment: Rehabilitation  THERAPY DIAG:  Other low back pain  Fatigue, unspecified type  Muscle weakness (generalized)  Chronic right shoulder pain  ONSET DATE: 3 yrs  SUBJECTIVE:                                                                                                                                                                                           SUBJECTIVE STATEMENT: Lt hip was very painful last night.    POOL ACCESS:  currently  none.  PERTINENT HISTORY:  Aqua therapy hip and back 2x/wk for 4-6wks lumbar spine spondylosis  -recent reverse shoulder replacement -multiple myeloma  PAIN:  Are you having pain? Yes: NPRS scale: current 4-5/10; Pain location:  L hip; Rt shoulder blade Pain description: dull Aggravating factors: prolonged positions Relieving factors: stretches  PRECAUTIONS: None  RED FLAGS: None   WEIGHT BEARING RESTRICTIONS: No  FALLS:  Has patient fallen in last 6 months? No  LIVING ENVIRONMENT: Lives with: lives with their family Lives in: House/apartment Stairs: Yes: Internal: 16 steps; on right going up Has following equipment at home: None  OCCUPATION: sititng job  PLOF: Independent  PATIENT GOALS: decrease pain  NEXT MD VISIT: post aquatic therapy  OBJECTIVE:  Note: Objective measures were completed at Evaluation unless otherwise noted.  DIAGNOSTIC FINDINGS:  04/08/23 MRI Lumbar IMPRESSION: 1. Treated myelomatous lesion in the bony pelvis. No new myelomatous lesion observed. 2. Lumbar spondylosis and degenerative disc disease, causing mild impingement at L5-S1.  PATIENT SURVEYS:  FOTO Primary score 42% with goal of 53%  COGNITION: Overall cognitive status: Within functional limits for tasks assessed     SENSATION: P&N bilateral feet  MUSCLE LENGTH:   POSTURE: No Significant postural limitations  PALPATION: No TTP  LUMBAR ROM:   WFL with pain  LOWER EXTREMITY ROM:     WFL  LOWER EXTREMITY MMT:    HD (lbs) Right eval Left eval  Hip flexion 27.2 34.2  Hip extension    Hip abduction 19.3 23.5  Hip adduction    Hip internal rotation    Hip external rotation    Knee flexion    Knee extension 42.0 41.8  Ankle dorsiflexion    Ankle plantarflexion    Ankle inversion    Ankle eversion     (Blank rows = not tested)  LUMBAR SPECIAL TESTS:  Slump test: Negative  FUNCTIONAL TESTS:  5 times sit to stand: 22.59 TUG 15.49  07/28/23 5 x STS:  22.23 TUG: 14.36   GAIT: Distance walked: 500 ft Assistive device utilized: None  Level of assistance: Complete Independence Comments: Cadence slowed  TREATMENT    Pt seen for aquatic therapy today.  Treatment took place in water 3.5-4.75 ft in depth at the [facility] pool. Temp of water was 91.  Pt entered/exited the pool via stairs independently with bilat rail.  - unsupported: walking multiple laps forward/ backward  - side stepping R/L -> with rainbow hand floats and arm addct/ abdct x 3 laps  - [place] with step back with yellow hand floats 2 x 10 each side  - return to walking with reciprocal arm swing - UE on yellow hand floats: hip circles CW/CCW 2 x 10 each LE; - marching with reciprocal row motion, walking forward/ backward - UE on wall: alternating single leg clams 2 x 10; Leg swings into hip abdct/ addct crossing midline 2 x 10 - TrA set with hollow long noodle pull down to thighs x 5 - straddling yellow noodle, UE on rainbow hand floats:  cycling  Pt requires the buoyancy and hydrostatic pressure of water for support, and to offload joints by unweighting joint load by at least 50 % in navel deep water and by at least 75-80% in chest to neck deep water.  Viscosity of the water is needed for resistance of strengthening. Water current perturbations provides challenge to standing balance requiring increased core activation.   PATIENT EDUCATION:  Education details: reacquainting with aquatic therapy  Person educated: Patient Education method: Explanation Education comprehension: verbalized understanding  HOME EXERCISE PROGRAM: Access Code: [email or web address] URL: [email or web address] Date: 08/02/2023 Prepared by: Geni Bers This aquatic home exercise program from Med[employer] utilizes pictures from land based exercises, but has been adapted prior to lamination and issuance.  * not issued yet    ASSESSMENT:  CLINICAL IMPRESSION: Good tolerance  for exercises completed in pool today. No increase in pain. Instruction began on HEP, but not issued yet.  Progressing well towards remained goals.       From initial evaluation:  Patient is a 63 y.o. f who was seen today for physical therapy evaluation and treatment for LBP. She has hx of multiple myeloma and is currently taking oral form of chemo on a 21 day cycle.  She reports having multiple lesions on her spine which has caused weakness.  She reports P&N in feet constantly since beginning chemo 3 years ago. She has received therapy in past but has had a decline in strength since.  Pt EDU on importance of maintaining and exercises program to retain gains made and potentially increasing.  She VU.  She will benefit from aquatic therapy intervention to improve deficits, decrease pain and return pt to optimal function/QOL. Will be assisted in finding access to a pool as able, to continue with program and/or refer to land based intervention for land based program.  OBJECTIVE IMPAIRMENTS: decreased activity tolerance, decreased endurance, decreased mobility, decreased strength, and pain.   ACTIVITY LIMITATIONS: carrying, lifting, bending, and locomotion level  PARTICIPATION LIMITATIONS: cleaning, laundry, community activity, and yard work  PERSONAL FACTORS: Behavior pattern, Past/current experiences, and 1-2 comorbidities: see PmHx  are also affecting patient's functional outcome.   REHAB POTENTIAL: Good  CLINICAL DECISION MAKING: Evolving/moderate complexity  EVALUATION COMPLEXITY: Moderate   GOALS: Goals reviewed with patient? Yes  SHORT TERM GOALS: Target date: 08/18/23  Pt to meet stated Foto Goal Baseline: Goal status: INITIAL  2. Pt will tolerate walking x >15 minutes without limitation to pain Baseline: up to 10 Goal status: In progress 08/02/23  3.  Pt will tolerate full aquatic sessions consistently without increase in pain and with improving function to demonstrate good  toleration and effectiveness of intervention.  Baseline:  Goal status: MET - 07/21/23  4.  Pt will improve on Tug test to <or= 13s to demonstrate statistically significant change and improvement in lower extremity function, mobility and decreased fall risk. Baseline: 15.49 Goal status: In progress 08/02/23  5.  Pt will improve on 5 X STS test to <or= 20s to demonstrate the MCID, and improving functional lower extremity strength, transitional movements, and balance Baseline: 22.59 Goal status: In progress 08/02/23  6.  Pt will be indep with final aquatic HEP for continued management of condition and will be instructed/informed of available pool access in area Baseline: no access Goal status: In Process 08/02/23  LONG TERM GOALS: Will set as approp at re-cert   PLAN:  PT FREQUENCY:1-2 x week  PT DURATION: 2 weeks  PLANNED INTERVENTIONS: 97164- PT Re-evaluation, 97110-Therapeutic exercises, 97530- Therapeutic activity, 97112- Neuromuscular re-education, 97535- Self Care, 41324- Manual therapy, [phone]- Gait training, [phone]- Orthotic Fit/training, [phone]- Aquatic Therapy, [phone]- Ionotophoresis 4mg /ml Dexamethasone, Patient/Family education, Balance training, Stair training, Taping, Dry Needling, Joint mobilization, DME instructions, Cryotherapy, and Moist heat.  PLAN FOR NEXT SESSION: Aquatics only: general strengthening with focus on core and hips.  Pain management  Mayer Camel, PTA 08/04/23 8:50 AM [facility] [address] [place], [place], [address] Phone: [phone]   Fax:  [phone]

## 2023-08-07 LAB — MULTIPLE MYELOMA PANEL, SERUM
Albumin SerPl Elph-Mcnc: 3.3 g/dL (ref 2.9–4.4)
Albumin/Glob SerPl: 1.1 (ref 0.7–1.7)
Alpha 1: 0.2 g/dL (ref 0.0–0.4)
Alpha2 Glob SerPl Elph-Mcnc: 0.6 g/dL (ref 0.4–1.0)
B-Globulin SerPl Elph-Mcnc: 1 g/dL (ref 0.7–1.3)
Gamma Glob SerPl Elph-Mcnc: 1.3 g/dL (ref 0.4–1.8)
Globulin, Total: 3.2 g/dL (ref 2.2–3.9)
IgA: 192 mg/dL (ref 87–352)
IgG (Immunoglobin G), Serum: 1428 mg/dL (ref 586–1602)
IgM (Immunoglobulin M), Srm: 26 mg/dL (ref 26–217)
Total Protein ELP: 6.5 g/dL (ref 6.0–8.5)

## 2023-08-09 ENCOUNTER — Ambulatory Visit (HOSPITAL_BASED_OUTPATIENT_CLINIC_OR_DEPARTMENT_OTHER): Payer: Self-pay | Admitting: Physical Therapy

## 2023-08-09 ENCOUNTER — Encounter (HOSPITAL_BASED_OUTPATIENT_CLINIC_OR_DEPARTMENT_OTHER): Payer: Self-pay | Admitting: Physical Therapy

## 2023-08-09 DIAGNOSIS — M6281 Muscle weakness (generalized): Secondary | ICD-10-CM | POA: Diagnosis not present

## 2023-08-09 DIAGNOSIS — R5383 Other fatigue: Secondary | ICD-10-CM

## 2023-08-09 DIAGNOSIS — G8929 Other chronic pain: Secondary | ICD-10-CM | POA: Diagnosis not present

## 2023-08-09 DIAGNOSIS — M5459 Other low back pain: Secondary | ICD-10-CM | POA: Diagnosis not present

## 2023-08-09 DIAGNOSIS — M25511 Pain in right shoulder: Secondary | ICD-10-CM | POA: Diagnosis not present

## 2023-08-09 NOTE — Therapy (Signed)
 OUTPATIENT PHYSICAL THERAPY THORACOLUMBAR TREATMENT             Patient Name: Hannah [name] MRN: [id number] DOB:[birth year], 63 y.o., female Today's Date: 08/09/2023  END OF SESSION:  PT End of Session - 08/09/23 0807     Visit Number 8    Number of Visits 12    Date for PT Re-Evaluation 08/18/23    Authorization Type BCBS    PT Start Time 0805    PT Stop Time 0843    PT Time Calculation (min) 38 min    Behavior During Therapy [facility] for tasks assessed/performed               Past Medical History:  Diagnosis Date   Allergy    Anemia    Anxiety    Arthritis    Asthma    Cancer (HCC)    multiple myeloma   Depression    Hypertension    Past Surgical History:  Procedure Laterality Date   ABDOMINAL HYSTERECTOMY     BICEPT TENODESIS Right 01/31/2023   Procedure: BICEPS TENODESIS;  Surgeon: Cammy Copa, MD;  Location: [facility] OR;  Service: Orthopedics;  Laterality: Right;   CHOLECYSTECTOMY  1993   COLONOSCOPY     HERNIA REPAIR     REVERSE SHOULDER ARTHROPLASTY Right 01/31/2023   Procedure: REVERSE SHOULDER ARTHROPLASTY;  Surgeon: Cammy Copa, MD;  Location: [facility] OR;  Service: Orthopedics;  Laterality: Right;   TOTAL KNEE ARTHROPLASTY Right 01/28/2016   TOTAL KNEE ARTHROPLASTY Right 01/28/2016   Procedure: RIGHT TOTAL KNEE ARTHROPLASTY;  Surgeon: Tarry Kos, MD;  Location: [facility] OR;  Service: Orthopedics;  Laterality: Right;   TRIGGER FINGER RELEASE Right 06/18/2014   Procedure: RIGHT LONG FINGER TRIGGER RELEASE;  Surgeon: Cheral Almas, MD;  Location: [facility];  Service: Orthopedics;  Laterality: Right;   TUBAL LIGATION     VAGINAL HYSTERECTOMY Bilateral 11/04/2016   Procedure: HYSTERECTOMY VAGINAL with Bilateral Salpingectomy;  Surgeon: Hal Morales, MD;  Location: [facility]S;  Service: Gynecology;  Laterality: Bilateral;   WEIL OSTEOTOMY Right 07/07/2020   Procedure: RIGHT FOOT WEIL OSTEOTOMY 2, 3, AND 4 METATARSALS AND PROXIMAL  INTERPHALANGEAL JOINT FUSION 2 & 4 TOES;  Surgeon: Nadara Mustard, MD;  Location: [facility];  Service: Orthopedics;  Laterality: Right;   Patient Active Problem List   Diagnosis Date Noted   Biceps tendonitis on right 02/11/2023   S/P reverse total shoulder arthroplasty, right 01/31/2023   Multiple myeloma in remission (HCC) 07/13/2020   Claw toe, right    Trigger finger, left middle finger 04/09/2020   Stenosing tenosynovitis of finger of left hand 02/27/2020   Hemoglobin C trait (HCC) 02/27/2018   Major depression, recurrent (HCC) 01/12/2018   Chronic right shoulder pain 12/20/2017   MDD (major depressive disorder), recurrent episode, moderate (HCC) 11/04/2017   Primary osteoarthritis of both hands 09/26/2017   Primary osteoarthritis of right shoulder 09/26/2017   Primary osteoarthritis of both hips 09/26/2017   Status post total knee replacement, right 09/26/2017   Primary osteoarthritis of both feet 09/26/2017   History of asthma 09/26/2017   Primary osteoarthritis of left knee 04/18/2017   Menorrhagia 11/04/2016   Fibroid tumor 11/04/2016   Adenomyosis 11/04/2016   Hypertension, essential 11/04/2016   Total knee replacement status 01/28/2016    PCP: Dr Guerry Bruin  REFERRING PROVIDER:   Cammy Copa, MD    REFERRING DIAG: M54.50 (ICD-10-CM) - Low back pain, unspecified back  pain laterality, unspecified chronicity, unspecified whether sciatica present   Rationale for Evaluation and Treatment: Rehabilitation  THERAPY DIAG:  Other low back pain  Fatigue, unspecified type  Muscle weakness (generalized)  ONSET DATE: 3 yrs  SUBJECTIVE:                                                                                                                                                                                           SUBJECTIVE STATEMENT: " I'm a little achy"   POOL ACCESS:  currently none.  PERTINENT HISTORY:  Aqua therapy hip and  back 2x/wk for 4-6wks lumbar spine spondylosis  -recent reverse shoulder replacement -multiple myeloma  PAIN:  Are you having pain? Yes: NPRS scale: current 4/10; Pain location:  L hip; Rt shoulder blade Pain description: dull, ache Aggravating factors: prolonged positions Relieving factors: stretches  PRECAUTIONS: None  RED FLAGS: None   WEIGHT BEARING RESTRICTIONS: No  FALLS:  Has patient fallen in last 6 months? No  LIVING ENVIRONMENT: Lives with: lives with their family Lives in: House/apartment Stairs: Yes: Internal: 16 steps; on right going up Has following equipment at home: None  OCCUPATION: sititng job  PLOF: Independent  PATIENT GOALS: decrease pain  NEXT MD VISIT: post aquatic therapy  OBJECTIVE:  Note: Objective measures were completed at Evaluation unless otherwise noted.  DIAGNOSTIC FINDINGS:  04/08/23 MRI Lumbar IMPRESSION: 1. Treated myelomatous lesion in the bony pelvis. No new myelomatous lesion observed. 2. Lumbar spondylosis and degenerative disc disease, causing mild impingement at L5-S1.  PATIENT SURVEYS:  FOTO Primary score 42% with goal of 53%  COGNITION: Overall cognitive status: Within functional limits for tasks assessed     SENSATION: P&N bilateral feet  MUSCLE LENGTH:   POSTURE: No Significant postural limitations  PALPATION: No TTP  LUMBAR ROM:   WFL with pain  LOWER EXTREMITY ROM:     WFL  LOWER EXTREMITY MMT:    HD (lbs) Right eval Left eval  Hip flexion 27.2 34.2  Hip extension    Hip abduction 19.3 23.5  Hip adduction    Hip internal rotation    Hip external rotation    Knee flexion    Knee extension 42.0 41.8  Ankle dorsiflexion    Ankle plantarflexion    Ankle inversion    Ankle eversion     (Blank rows = not tested)  LUMBAR SPECIAL TESTS:  Slump test: Negative  FUNCTIONAL TESTS:  5 times sit to stand: 22.59 TUG 15.49  07/28/23 5 x STS: 22.23 TUG: 14.36   GAIT: Distance walked: 500  ft Assistive device utilized: None Level of assistance: Complete Independence Comments: Cadence  slowed  TREATMENT    Pt seen for aquatic therapy today.  Treatment took place in water 3.5-4.75 ft in depth at the [facility] pool. Temp of water was 91.  Pt entered/exited the pool via stairs independently with bilat rail.  - unsupported: walking multiple laps forward/ backward  - side stepping with rainbow hand floats and arm addct/ abdct x 3 laps  - farmer carry with single/ bilat rainbow hand floats, walking forward/ backward  - UE on wall:  alternating single leg clams x 8 each LE; hip abdct/ addct 2 x 10 each LE ; hip circles 2 x 10 each LE - [place] [employer] [place] with step back with yellow hand floats  x 10 each side  - TrA set with hollow long noodle pull down to thighs x 10 - straddling yellow noodle, UE on rainbow hand floats:  cycling - 3 way LE stretch with ankle on holllow long noodle  Pt requires the buoyancy and hydrostatic pressure of water for support, and to offload joints by unweighting joint load by at least 50 % in navel deep water and by at least 75-80% in chest to neck deep water.  Viscosity of the water is needed for resistance of strengthening. Water current perturbations provides challenge to standing balance requiring increased core activation.   PATIENT EDUCATION:  Education details: aquatic HEP - instruction given Person educated: Patient Education method: Explanation Education comprehension: verbalized understanding  HOME EXERCISE PROGRAM: Access Code: [email or web address] URL: [email or web address] Date: 08/02/2023 Prepared by: Geni Bers This aquatic home exercise program from MedBridge utilizes pictures from land based exercises, but has been adapted prior to lamination and issuance.      ASSESSMENT:  CLINICAL IMPRESSION: Good tolerance for exercises completed in pool today. No increase in pain. Instruction continued on HEP, but not issued yet;  will issue on next (last visit).  D/C planning.  Progressing well towards remained goals. Will assess goals next session prior to pool time.       From initial evaluation:  Patient is a 63 y.o. f who was seen today for physical therapy evaluation and treatment for LBP. She has hx of multiple myeloma and is currently taking oral form of chemo on a 21 day cycle.  She reports having multiple lesions on her spine which has caused weakness.  She reports P&N in feet constantly since beginning chemo 3 years ago. She has received therapy in past but has had a decline in strength since.  Pt EDU on importance of maintaining and exercises program to retain gains made and potentially increasing.  She VU.  She will benefit from aquatic therapy intervention to improve deficits, decrease pain and return pt to optimal function/QOL. Will be assisted in finding access to a pool as able, to continue with program and/or refer to land based intervention for land based program.  OBJECTIVE IMPAIRMENTS: decreased activity tolerance, decreased endurance, decreased mobility, decreased strength, and pain.   ACTIVITY LIMITATIONS: carrying, lifting, bending, and locomotion level  PARTICIPATION LIMITATIONS: cleaning, laundry, community activity, and yard work  PERSONAL FACTORS: Behavior pattern, Past/current experiences, and 1-2 comorbidities: see PmHx  are also affecting patient's functional outcome.   REHAB POTENTIAL: Good  CLINICAL DECISION MAKING: Evolving/moderate complexity  EVALUATION COMPLEXITY: Moderate   GOALS: Goals reviewed with patient? Yes  SHORT TERM GOALS: Target date: 08/18/23  Pt to meet stated Foto Goal Baseline: Goal status: INITIAL  2. Pt will tolerate walking x >15 minutes without limitation to pain Baseline: up to 10 Goal  status: In progress 08/02/23  3.  Pt will tolerate full aquatic sessions consistently without increase in pain and with improving function to demonstrate good toleration  and effectiveness of intervention.  Baseline:  Goal status: MET - 07/21/23  4.  Pt will improve on Tug test to <or= 13s to demonstrate statistically significant change and improvement in lower extremity function, mobility and decreased fall risk. Baseline: 15.49 Goal status: In progress 08/02/23  5.  Pt will improve on 5 X STS test to <or= 20s to demonstrate the MCID, and improving functional lower extremity strength, transitional movements, and balance Baseline: 22.59 Goal status: In progress 08/02/23  6.  Pt will be indep with final aquatic HEP for continued management of condition and will be instructed/informed of available pool access in area Baseline: no access Goal status: In Process 08/02/23  LONG TERM GOALS: Will set as approp at re-cert   PLAN:  PT FREQUENCY:1-2 x week  PT DURATION: 2 weeks  PLANNED INTERVENTIONS: 97164- PT Re-evaluation, 97110-Therapeutic exercises, 97530- Therapeutic activity, 97112- Neuromuscular re-education, 97535- Self Care, 13086- Manual therapy, [phone]- Gait training, [phone]- Orthotic Fit/training, [phone]- Aquatic Therapy, [phone]- Ionotophoresis 4mg /ml Dexamethasone, Patient/Family education, Balance training, Stair training, Taping, Dry Needling, Joint mobilization, DME instructions, Cryotherapy, and Moist heat.  PLAN FOR NEXT SESSION: Aquatics only: general strengthening with focus on core and hips.  Pain management  Mayer Camel, PTA 08/09/23 8:39 AM [facility] [address] [place], [place], [address] Phone: [phone]   Fax:  [phone]

## 2023-08-11 ENCOUNTER — Ambulatory Visit (HOSPITAL_BASED_OUTPATIENT_CLINIC_OR_DEPARTMENT_OTHER): Payer: Self-pay | Admitting: Physical Therapy

## 2023-08-11 DIAGNOSIS — R5383 Other fatigue: Secondary | ICD-10-CM | POA: Diagnosis not present

## 2023-08-11 DIAGNOSIS — M6281 Muscle weakness (generalized): Secondary | ICD-10-CM | POA: Diagnosis not present

## 2023-08-11 DIAGNOSIS — G8929 Other chronic pain: Secondary | ICD-10-CM | POA: Diagnosis not present

## 2023-08-11 DIAGNOSIS — M5459 Other low back pain: Secondary | ICD-10-CM | POA: Diagnosis not present

## 2023-08-11 DIAGNOSIS — M25511 Pain in right shoulder: Secondary | ICD-10-CM | POA: Diagnosis not present

## 2023-08-11 NOTE — Therapy (Addendum)
 OUTPATIENT PHYSICAL THERAPY THORACOLUMBAR TREATMENT           PHYSICAL THERAPY DISCHARGE SUMMARY  Visits from Start of Care: 9  Current functional level related to goals / functional outcomes: Improved see goals   Remaining deficits: Chronic condition   Education / Equipment: Management of chronic condition/HEP   Patient agrees to discharge. Patient goals were partially met. Patient is being discharged due to maximized rehab potential.   Addend Corrie Dandy Tomma Lightning) Ziemba MPT 09/04/23 4:49 PM [facility] [address] [place], [place], [address] Phone: [phone]   Fax:  [phone]    Patient Name: Hannah [name] MRN: [id number] DOB:April 24, 1961, 63 y.o., female Today's Date: 08/11/2023  END OF SESSION:  PT End of Session - 08/11/23 0815     Visit Number 9    Number of Visits 12    Date for PT Re-Evaluation 08/18/23    Authorization Type BCBS    PT Start Time 0804    PT Stop Time 0842    PT Time Calculation (min) 38 min    Activity Tolerance Patient tolerated treatment well    Behavior During Therapy [facility] for tasks assessed/performed               Past Medical History:  Diagnosis Date   Allergy    Anemia    Anxiety    Arthritis    Asthma    Cancer (HCC)    multiple myeloma   Depression    Hypertension    Past Surgical History:  Procedure Laterality Date   ABDOMINAL HYSTERECTOMY     BICEPT TENODESIS Right 01/31/2023   Procedure: BICEPS TENODESIS;  Surgeon: Cammy Copa, MD;  Location: [facility] OR;  Service: Orthopedics;  Laterality: Right;   CHOLECYSTECTOMY  1993   COLONOSCOPY     HERNIA REPAIR     REVERSE SHOULDER ARTHROPLASTY Right 01/31/2023   Procedure: REVERSE SHOULDER ARTHROPLASTY;  Surgeon: Cammy Copa, MD;  [facility] OR;  Service: Orthopedics;  Laterality: Right;   TOTAL KNEE ARTHROPLASTY Right 01/28/2016   TOTAL KNEE ARTHROPLASTY Right 01/28/2016   Procedure: RIGHT TOTAL KNEE  ARTHROPLASTY;  Surgeon: Tarry Kos, MD;  Location: [facility] OR;  Service: Orthopedics;  Laterality: Right;   TRIGGER FINGER RELEASE Right 06/18/2014   Procedure: RIGHT LONG FINGER TRIGGER RELEASE;  Surgeon: Cheral Almas, MD;  Location: [facility];  Service: Orthopedics;  Laterality: Right;   TUBAL LIGATION     VAGINAL HYSTERECTOMY Bilateral 11/04/2016   Procedure: HYSTERECTOMY VAGINAL with Bilateral Salpingectomy;  Surgeon: Hal Morales, MD;  Location: [facility];  Service: Gynecology;  Laterality: Bilateral;   WEIL OSTEOTOMY Right 07/07/2020   Procedure: RIGHT FOOT WEIL OSTEOTOMY 2, 3, AND 4 METATARSALS AND PROXIMAL INTERPHALANGEAL JOINT FUSION 2 & 4 TOES;  Surgeon: Nadara Mustard, MD;  Location: [facility];  Service: Orthopedics;  Laterality: Right;   Patient Active Problem List   Diagnosis Date Noted   Biceps tendonitis on right 02/11/2023   S/P reverse total shoulder arthroplasty, right 01/31/2023   Multiple myeloma in remission (HCC) 07/13/2020   Claw toe, right    Trigger finger, left middle finger 04/09/2020   Stenosing tenosynovitis of finger of left hand 02/27/2020   Hemoglobin C trait (HCC) 02/27/2018   Major depression, recurrent (HCC) 01/12/2018   Chronic right shoulder pain 12/20/2017   MDD (major depressive disorder), recurrent episode, moderate (HCC) 11/04/2017   Primary osteoarthritis of both hands 09/26/2017  Primary osteoarthritis of right shoulder 09/26/2017   Primary osteoarthritis of both hips 09/26/2017   Status post total knee replacement, right 09/26/2017   Primary osteoarthritis of both feet 09/26/2017   History of asthma 09/26/2017   Primary osteoarthritis of left knee 04/18/2017   Menorrhagia 11/04/2016   Fibroid tumor 11/04/2016   Adenomyosis 11/04/2016   Hypertension, essential 11/04/2016   Total knee replacement status 01/28/2016    PCP: Dr Guerry Bruin  REFERRING PROVIDER:   Cammy Copa, MD     REFERRING DIAG: M54.50 (ICD-10-CM) - Low back pain, unspecified back pain laterality, unspecified chronicity, unspecified whether sciatica present   Rationale for Evaluation and Treatment: Rehabilitation  THERAPY DIAG:  Other low back pain  Fatigue, unspecified type  Muscle weakness (generalized)  Chronic right shoulder pain  ONSET DATE: 3 yrs  SUBJECTIVE:                                                                                                                                                                                           SUBJECTIVE STATEMENT: Pt reports no new changes since last visit.   POOL ACCESS:  currently none.  PERTINENT HISTORY:  Aqua therapy hip and back 2x/wk for 4-6wks lumbar spine spondylosis  -recent reverse shoulder replacement -multiple myeloma  PAIN:  Are you having pain? Yes: NPRS scale: current 3/10; Pain location:  L hip; Rt shoulder blade Pain description: dull, ache Aggravating factors: prolonged positions Relieving factors: stretches  PRECAUTIONS: None  RED FLAGS: None   WEIGHT BEARING RESTRICTIONS: No  FALLS:  Has patient fallen in last 6 months? No  LIVING ENVIRONMENT: Lives with: lives with their family Lives in: House/apartment Stairs: Yes: Internal: 16 steps; on right going up Has following equipment at home: None  OCCUPATION: sititng job  PLOF: Independent  PATIENT GOALS: decrease pain  NEXT MD VISIT: post aquatic therapy  OBJECTIVE:  Note: Objective measures were completed at Evaluation unless otherwise noted.  DIAGNOSTIC FINDINGS:  04/08/23 MRI Lumbar IMPRESSION: 1. Treated myelomatous lesion in the bony pelvis. No new myelomatous lesion observed. 2. Lumbar spondylosis and degenerative disc disease, causing mild impingement at L5-S1.  PATIENT SURVEYS:  FOTO Primary score 42% with goal of 53%  08/11/23: FOTO 53%  COGNITION: Overall cognitive status: Within functional limits for tasks  assessed     SENSATION: P&N bilateral feet  MUSCLE LENGTH:   POSTURE: No Significant postural limitations  PALPATION: No TTP  LUMBAR ROM:   WFL with pain  LOWER EXTREMITY ROM:     WFL  LOWER EXTREMITY MMT:    HD (lbs) Right eval Left eval  Hip flexion 27.2  34.2  Hip extension    Hip abduction 19.3 23.5  Hip adduction    Hip internal rotation    Hip external rotation    Knee flexion    Knee extension 42.0 41.8  Ankle dorsiflexion    Ankle plantarflexion    Ankle inversion    Ankle eversion     (Blank rows = not tested)  LUMBAR SPECIAL TESTS:  Slump test: Negative  FUNCTIONAL TESTS:  5 times sit to stand: 22.59 TUG 15.49  07/28/23 5 x STS: 22.23 TUG: 14.36  08/11/23 5x STS: 21.53s TUG: 12.71s   GAIT: Distance walked: 500 ft Assistive device utilized: None Level of assistance: Complete Independence Comments: Cadence slowed  TREATMENT    Pt seen for aquatic therapy today.  Treatment took place in water 3.5-4.75 ft in depth at the [facility] pool. Temp of water was 91.  Pt entered/exited the pool via stairs independently with bilat rail.  - unsupported: walking multiple laps forward/ backward  - side stepping with rainbow hand floats and arm addct/ abdct x 3 laps  - farmer carry with single/ bilat rainbow hand floats, walking forward/ backward, multiple laps   - UE on wall:  alternating single leg clams x 10 each LE; hip abdct/ addct  x 10 each LE ; hip circles 2 x 10 each LE - 3 way LE stretch with ankle on holllow long noodle - TrA set with hollow long noodle pull down to thighs x 10 - [place] with step back with yellow hand floats  x 10 each side  - straddling yellow noodle, UE on rainbow hand floats:  cycling   Pt requires the buoyancy and hydrostatic pressure of water for support, and to offload joints by unweighting joint load by at least 50 % in navel deep water and by at least 75-80% in chest to neck deep water.  Viscosity of  the water is needed for resistance of strengthening. Water current perturbations provides challenge to standing balance requiring increased core activation.   PATIENT EDUCATION:  Education details: aquatic HEP - instruction given Person educated: Patient Education method: Explanation Education comprehension: verbalized understanding  HOME EXERCISE PROGRAM: Access Code: [email or web address] URL: [email or web address] Date: 08/02/2023 Prepared by: Geni Bers This aquatic home exercise program from [employer] utilizes pictures from land based exercises, but has been adapted prior to lamination and issuance.      ASSESSMENT:  CLINICAL IMPRESSION: Good tolerance for exercises completed in pool today. No increase in pain reported. Pt issued laminated HEP and required only minimal cues for execution of exercises.  FOTO goal met.  5x STS time improved as well as TUG.  Pt has partially met her goals and in agreement for d/c to HEP.      From initial evaluation:  Patient is a 63 y.o. f who was seen today for physical therapy evaluation and treatment for LBP. She has hx of multiple myeloma and is currently taking oral form of chemo on a 21 day cycle.  She reports having multiple lesions on her spine which has caused weakness.  She reports P&N in feet constantly since beginning chemo 3 years ago. She has received therapy in past but has had a decline in strength since.  Pt EDU on importance of maintaining and exercises program to retain gains made and potentially increasing.  She VU.  She will benefit from aquatic therapy intervention to improve deficits, decrease pain and return pt to optimal function/QOL. Will be assisted in finding access to  a pool as able, to continue with program and/or refer to land based intervention for land based program.  OBJECTIVE IMPAIRMENTS: decreased activity tolerance, decreased endurance, decreased mobility, decreased strength, and pain.   ACTIVITY  LIMITATIONS: carrying, lifting, bending, and locomotion level  PARTICIPATION LIMITATIONS: cleaning, laundry, community activity, and yard work  PERSONAL FACTORS: Behavior pattern, Past/current experiences, and 1-2 comorbidities: see PmHx  are also affecting patient's functional outcome.   REHAB POTENTIAL: Good  CLINICAL DECISION MAKING: Evolving/moderate complexity  EVALUATION COMPLEXITY: Moderate   GOALS: Goals reviewed with patient? Yes  SHORT TERM GOALS: Target date: 08/18/23  Pt to meet stated Foto Goal Baseline: Goal status: met 08/11/23  2. Pt will tolerate walking x >15 minutes without limitation to pain Baseline: up to 10 Goal status: not met 08/11/23  3.  Pt will tolerate full aquatic sessions consistently without increase in pain and with improving function to demonstrate good toleration and effectiveness of intervention.  Baseline:  Goal status: MET - 07/21/23  4.  Pt will improve on Tug test to <or= 13s to demonstrate statistically significant change and improvement in lower extremity function, mobility and decreased fall risk. Baseline: 15.49 Goal status:MET - 08/11/23  5.  Pt will improve on 5 X STS test to <or= 20s to demonstrate the MCID, and improving functional lower extremity strength, transitional movements, and balance Baseline: 22.59, see above  Goal status: not met - 08/11/23  6.  Pt will be indep with final aquatic HEP for continued management of condition and will be instructed/informed of available pool access in area Baseline: no access Goal status: met - 08/11/23  LONG TERM GOALS: Will set as approp at re-cert   PLAN:  PT FREQUENCY:1-2 x week  PT DURATION: 2 weeks  PLANNED INTERVENTIONS: 97164- PT Re-evaluation, 97110-Therapeutic exercises, 97530- Therapeutic activity, 97112- Neuromuscular re-education, 97535- Self Care, 16109- Manual therapy, [id number]- Gait training, [phone]- Orthotic Fit/training, [phone]- Aquatic Therapy, [phone]- Ionotophoresis  4mg /ml Dexamethasone, Patient/Family education, Balance training, Stair training, Taping, Dry Needling, Joint mobilization, DME instructions, Cryotherapy, and Moist heat.   Mayer Camel, PTA 08/11/23 8:42 AM [facility] [address] [place], [place], [address] Phone: [phone]   Fax:  [phone]

## 2023-08-29 ENCOUNTER — Other Ambulatory Visit: Payer: Self-pay

## 2023-08-30 ENCOUNTER — Other Ambulatory Visit: Payer: Self-pay | Admitting: Hematology

## 2023-09-04 ENCOUNTER — Other Ambulatory Visit: Payer: Self-pay

## 2023-09-04 MED ORDER — LENALIDOMIDE 10 MG PO CAPS: 10.0000 mg | ORAL_CAPSULE | Freq: Every day | ORAL | 0 refills | Status: DC

## 2023-09-12 DIAGNOSIS — J4540 Moderate persistent asthma, uncomplicated: Secondary | ICD-10-CM | POA: Diagnosis not present

## 2023-09-12 DIAGNOSIS — R062 Wheezing: Secondary | ICD-10-CM | POA: Diagnosis not present

## 2023-10-03 DIAGNOSIS — I10 Essential (primary) hypertension: Secondary | ICD-10-CM | POA: Diagnosis not present

## 2023-10-03 DIAGNOSIS — J4540 Moderate persistent asthma, uncomplicated: Secondary | ICD-10-CM | POA: Diagnosis not present

## 2023-10-03 DIAGNOSIS — C9000 Multiple myeloma not having achieved remission: Secondary | ICD-10-CM | POA: Diagnosis not present

## 2023-10-04 DIAGNOSIS — I10 Essential (primary) hypertension: Secondary | ICD-10-CM | POA: Diagnosis not present

## 2023-10-04 DIAGNOSIS — D509 Iron deficiency anemia, unspecified: Secondary | ICD-10-CM | POA: Diagnosis not present

## 2023-10-05 ENCOUNTER — Other Ambulatory Visit: Payer: Self-pay | Admitting: Hematology

## 2023-10-05 MED ORDER — LENALIDOMIDE 10 MG PO CAPS
10.0000 mg | ORAL_CAPSULE | Freq: Every day | ORAL | 0 refills | Status: DC
Start: 2023-10-05 — End: 2023-11-03

## 2023-10-06 ENCOUNTER — Other Ambulatory Visit: Payer: Self-pay

## 2023-10-11 DIAGNOSIS — Z1339 Encounter for screening examination for other mental health and behavioral disorders: Secondary | ICD-10-CM | POA: Diagnosis not present

## 2023-10-11 DIAGNOSIS — I10 Essential (primary) hypertension: Secondary | ICD-10-CM | POA: Diagnosis not present

## 2023-10-11 DIAGNOSIS — C9000 Multiple myeloma not having achieved remission: Secondary | ICD-10-CM | POA: Diagnosis not present

## 2023-10-11 DIAGNOSIS — R82998 Other abnormal findings in urine: Secondary | ICD-10-CM | POA: Diagnosis not present

## 2023-10-11 DIAGNOSIS — Z0000 Encounter for general adult medical examination without abnormal findings: Secondary | ICD-10-CM | POA: Diagnosis not present

## 2023-10-11 DIAGNOSIS — Z1331 Encounter for screening for depression: Secondary | ICD-10-CM | POA: Diagnosis not present

## 2023-11-03 ENCOUNTER — Other Ambulatory Visit: Payer: Self-pay

## 2023-11-03 ENCOUNTER — Other Ambulatory Visit: Payer: Self-pay | Admitting: Hematology

## 2023-11-03 MED ORDER — LENALIDOMIDE 10 MG PO CAPS: 10.0000 mg | ORAL_CAPSULE | Freq: Every day | ORAL | 0 refills | Status: DC

## 2023-11-06 ENCOUNTER — Other Ambulatory Visit: Payer: Self-pay

## 2023-11-21 ENCOUNTER — Ambulatory Visit
Admission: RE | Admit: 2023-11-21 | Discharge: 2023-11-21 | Disposition: A | Discharge status: Home / Self Care | Source: Ambulatory Visit | Attending: Orthopedic Surgery | Admitting: Orthopedic Surgery

## 2023-11-21 DIAGNOSIS — S52044K Nondisplaced fracture of coronoid process of right ulna, subsequent encounter for closed fracture with nonunion: Secondary | ICD-10-CM | POA: Diagnosis not present

## 2023-11-21 DIAGNOSIS — Z96611 Presence of right artificial shoulder joint: Secondary | ICD-10-CM | POA: Diagnosis not present

## 2023-11-21 DIAGNOSIS — M25511 Pain in right shoulder: Secondary | ICD-10-CM | POA: Diagnosis not present

## 2023-11-22 ENCOUNTER — Other Ambulatory Visit

## 2023-12-05 ENCOUNTER — Ambulatory Visit: Payer: Self-pay | Admitting: Orthopedic Surgery

## 2023-12-05 NOTE — Progress Notes (Signed)
 Hi Lauren can you set her up for follow-up at some point with either me or [facility].  Thanks

## 2023-12-06 ENCOUNTER — Other Ambulatory Visit: Payer: Self-pay | Admitting: Hematology

## 2023-12-07 NOTE — Telephone Encounter (Signed)
 Can you please schedule with Dr Addie or Herlene to review scan?

## 2023-12-07 NOTE — Telephone Encounter (Signed)
-----   Message from KANDICE Glendia Hutchinson sent at 12/05/2023  4:47 PM EDT ----- Erskin Maxwell can you set her up for follow-up at some point with either me or [facility].  Thanks ----- Message ----- From: Interface, Rad Results In Sent: 11/25/2023   6:54 AM EDT To: Cordella Glendia Hutchinson, MD

## 2023-12-08 ENCOUNTER — Encounter: Payer: Self-pay | Admitting: Hematology

## 2023-12-08 DIAGNOSIS — I10 Essential (primary) hypertension: Secondary | ICD-10-CM | POA: Diagnosis not present

## 2023-12-08 DIAGNOSIS — R5383 Other fatigue: Secondary | ICD-10-CM | POA: Diagnosis not present

## 2023-12-08 DIAGNOSIS — D509 Iron deficiency anemia, unspecified: Secondary | ICD-10-CM | POA: Diagnosis not present

## 2023-12-11 ENCOUNTER — Telehealth: Payer: Self-pay | Admitting: Hematology

## 2023-12-11 NOTE — Telephone Encounter (Signed)
 Scheduled appointments per 7/14 secure chat. Talked with the patient and he is aware of the made appointments.

## 2023-12-12 ENCOUNTER — Other Ambulatory Visit: Payer: Self-pay

## 2023-12-12 DIAGNOSIS — C9001 Multiple myeloma in remission: Secondary | ICD-10-CM

## 2023-12-12 NOTE — Assessment & Plan Note (Signed)
 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   -bone marrow biopsy on 07/30/20 showed slightly hypercellular marrow for age and 31% plasma cells. Cytogenetics and FISH were unremarkable -she received VRD (weekly Velcade and dexamethasone, Revlimid 2 weeks on 1 week off) 08/17/20 - June 2022. She responded well. -She underwent high-dose chemo with melphalan and autologous stem cell transplant at [facility] 12/23/20 by Dr. Rosaria Ferries.  White cells engrafted 01/03/21 and platelets 01/13/21, last platelet transfusion 01/02/21 -posttreatment PET 04/06/21 showed NED  -posttreatment bone marrow biopsy 04/29/21 showed 10-20% hypocellular marrow with myeloid hypoplasia and 3% plasma cells -s/p prophylactic valacyclovir for 1 year and dapsone for 6 months posttransplant -she will continue revaccination per [facility] protocol -she began maintenance Revlimid 10 mg daily days 1-21 q. 28 days on 05/03/21. She is tolerating well with no noticeable side effects. will continue indefinitely or until disease progression. -Her last MM panel showed negative M-protein, but slightly increase Kappa light chain level in 02/2023, overall  stable  -Revlimid was briefly held around her shoulder replacement in 27 February 2023.  She has restarted.

## 2023-12-13 ENCOUNTER — Inpatient Hospital Stay: Attending: Hematology

## 2023-12-13 ENCOUNTER — Encounter: Payer: Self-pay | Admitting: Internal Medicine

## 2023-12-13 ENCOUNTER — Ambulatory Visit (HOSPITAL_BASED_OUTPATIENT_CLINIC_OR_DEPARTMENT_OTHER): Admitting: Hematology

## 2023-12-13 ENCOUNTER — Ambulatory Visit (AMBULATORY_SURGERY_CENTER)

## 2023-12-13 ENCOUNTER — Other Ambulatory Visit: Payer: Self-pay

## 2023-12-13 VITALS — BP 126/76 | HR 98 | Temp 97.2°F | Resp 15 | Ht 65.0 in | Wt 214.4 lb

## 2023-12-13 VITALS — Ht 65.0 in | Wt 210.0 lb

## 2023-12-13 DIAGNOSIS — Z566 Other physical and mental strain related to work: Secondary | ICD-10-CM | POA: Insufficient documentation

## 2023-12-13 DIAGNOSIS — Z923 Personal history of irradiation: Secondary | ICD-10-CM | POA: Diagnosis not present

## 2023-12-13 DIAGNOSIS — E559 Vitamin D deficiency, unspecified: Secondary | ICD-10-CM | POA: Diagnosis not present

## 2023-12-13 DIAGNOSIS — M549 Dorsalgia, unspecified: Secondary | ICD-10-CM | POA: Diagnosis not present

## 2023-12-13 DIAGNOSIS — C9001 Multiple myeloma in remission: Secondary | ICD-10-CM

## 2023-12-13 DIAGNOSIS — F419 Anxiety disorder, unspecified: Secondary | ICD-10-CM | POA: Insufficient documentation

## 2023-12-13 DIAGNOSIS — F32A Depression, unspecified: Secondary | ICD-10-CM | POA: Insufficient documentation

## 2023-12-13 DIAGNOSIS — Z9221 Personal history of antineoplastic chemotherapy: Secondary | ICD-10-CM | POA: Insufficient documentation

## 2023-12-13 DIAGNOSIS — Z1321 Encounter for screening for nutritional disorder: Secondary | ICD-10-CM

## 2023-12-13 DIAGNOSIS — Z79899 Other long term (current) drug therapy: Secondary | ICD-10-CM | POA: Insufficient documentation

## 2023-12-13 DIAGNOSIS — Z7961 Long term (current) use of immunomodulator: Secondary | ICD-10-CM | POA: Insufficient documentation

## 2023-12-13 DIAGNOSIS — Z1211 Encounter for screening for malignant neoplasm of colon: Secondary | ICD-10-CM

## 2023-12-13 LAB — CBC WITH DIFFERENTIAL (CANCER CENTER ONLY)
Abs Immature Granulocytes: 0.03 K/uL (ref 0.00–0.07)
Basophils Absolute: 0 K/uL (ref 0.0–0.1)
Basophils Relative: 0 %
Eosinophils Absolute: 0.2 K/uL (ref 0.0–0.5)
Eosinophils Relative: 3 %
HCT: 34.4 % — ABNORMAL LOW (ref 36.0–46.0)
Hemoglobin: 11.4 g/dL — ABNORMAL LOW (ref 12.0–15.0)
Immature Granulocytes: 1 %
Lymphocytes Relative: 35 %
Lymphs Abs: 1.9 K/uL (ref 0.7–4.0)
MCH: 24.3 pg — ABNORMAL LOW (ref 26.0–34.0)
MCHC: 33.1 g/dL (ref 30.0–36.0)
MCV: 73.3 fL — ABNORMAL LOW (ref 80.0–100.0)
Monocytes Absolute: 0.3 K/uL (ref 0.1–1.0)
Monocytes Relative: 5 %
Neutro Abs: 3 K/uL (ref 1.7–7.7)
Neutrophils Relative %: 56 %
Platelet Count: 276 K/uL (ref 150–400)
RBC: 4.69 MIL/uL (ref 3.87–5.11)
RDW: 15.9 % — ABNORMAL HIGH (ref 11.5–15.5)
WBC Count: 5.3 K/uL (ref 4.0–10.5)
nRBC: 0 % (ref 0.0–0.2)

## 2023-12-13 LAB — CMP (CANCER CENTER ONLY)
ALT: 11 U/L (ref 0–44)
AST: 13 U/L — ABNORMAL LOW (ref 15–41)
Albumin: 3.8 g/dL (ref 3.5–5.0)
Alkaline Phosphatase: 68 U/L (ref 38–126)
Anion gap: 6 (ref 5–15)
BUN: 15 mg/dL (ref 6–20)
CO2: 30 mmol/L (ref 22–32)
Calcium: 8.8 mg/dL — ABNORMAL LOW (ref 8.9–10.3)
Chloride: 106 mmol/L (ref 98–111)
Creatinine: 0.83 mg/dL (ref 0.44–1.00)
GFR, Estimated: >60 mL/min (ref >60)
Glucose, Bld: 92 mg/dL (ref 70–99)
Potassium: 3.5 mmol/L (ref 3.5–5.1)
Sodium: 142 mmol/L (ref 135–145)
Total Bilirubin: 0.4 mg/dL (ref 0.0–1.2)
Total Protein: 7.3 g/dL (ref 6.5–8.1)

## 2023-12-13 LAB — VITAMIN D 25 HYDROXY (VIT D DEFICIENCY, FRACTURES): Vit D, 25-Hydroxy: 35.01 ng/mL (ref 30–100)

## 2023-12-13 MED ORDER — NA SULFATE-K SULFATE-MG SULF 17.5-3.13-1.6 GM/177ML PO SOLN: 1.0000 | Freq: Once | ORAL | 0 refills | Status: AC

## 2023-12-13 MED ORDER — LENALIDOMIDE 10 MG PO CAPS: 10.0000 mg | ORAL_CAPSULE | Freq: Every day | ORAL | 0 refills | Status: DC

## 2023-12-13 NOTE — Progress Notes (Signed)
 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   -bone marrow biopsy on 07/30/20 showed slightly hypercellular marrow for age and 31% plasma cells. Cytogenetics and FISH were unremarkable -she received VRD (weekly Velcade  and dexamethasone , Revlimid  2 weeks on 1 week off) 08/17/20 - June 2022. She responded well. -She underwent high-dose chemo with melphalan and autologous stem cell transplant at [facility] 12/23/20 by Dr. Arman.  White cells engrafted 01/03/21 and platelets 01/13/21, last platelet transfusion 01/02/21 -posttreatment PET 04/06/21 showed NED  -posttreatment bone marrow biopsy 04/29/21 showed 10-20% hypocellular marrow with myeloid hypoplasia and 3% plasma cells -s/p prophylactic valacyclovir  for 1 year and dapsone for 6 months posttransplant -she will continue revaccination per [facility] protocol -she began maintenance Revlimid  10  mg daily days 1-21 q. 28 days on 05/03/21. She is tolerating well with no noticeable side effects. will continue indefinitely or until disease progression. -Her last MM panel showed negative M-protein, but slightly increase Kappa light chain level in 02/2023, overall  stable  -Revlimid  was briefly held around her shoulder replacement in 27 February 2023.  She has restarted.  Assessment & Plan Multiple myeloma in remission Multiple myeloma is managed with maintenance Revlimid . She reports intermittent back pain, more frequent but not constant, possibly related to myeloma or other conditions like arthritis. Previous MRI showed multiped myeloma lesions in the bony pelvis, lumbar spondylosis, and degenerative changes causing mild nerve impingement on L5 and S1. No new bone lesions were identified. Lab results from March were stable. Revlimid  is continued to reduce recurrence risk, which is very high if stopped. - Continue Revlimid  therapy. - Order refill for Revlimid  prescription. - Monitor labs every three months. - Follow up with orthopedic specialist for back pain. - Postpone follow-up with his transplant team to September or Oct  Lumbar spondylosis with mild nerve impingement She experiences back pain, more frequent but not constant. Previous MRI showed lumbar spondylosis and degenerative changes causing mild nerve impingement on L5 and S1. She is scheduled to follow up with her orthopedic specialist for further evaluation and management. - Follow up with orthopedic specialist for back pain management.  Depression and anxiety She reports symptoms of depression and anxiety, including difficulty focusing, memory issues, and sleep disturbances. She is on sertraline  and has been referred for therapy by her PCP. She is hesitant to change or increase medication dosage. She is experiencing significant stress from work and personal life, including the recent loss of her dog. She is considering early  retirement to reduce  stress and improve her quality of life. - Continue sertraline  as prescribed. - Follow up with therapist as referred by PCP. - Consider discussing medication adjustment with PCP if symptoms persist.  Vitamin D  deficiency Vitamin D  deficiency was noted in previous labs. She has been advised to increase her intake of over-the-counter vitamin D3 supplements. - Increase vitamin D3 supplementation as advised by PCP.  Plan - She is clinically stable, lab reviewed, no concern for multiple myeloma progression - Continue maintenance Revlimid  - She will call [facility] Dr. Arman to postpone her appointment on 01/10/2024 to Sep or Oct  -f/u 3 months after her visit at [facility]   SUMMARY OF ONCOLOGIC HISTORY: Oncology History Overview Note  Cancer Staging Multiple myeloma (HCC) Staging form: Plasma Cell Myeloma and Plasma Cell Disorders, AJCC 8th Edition - Clinical stage from 07/20/2020: Beta-2 -microglobulin (mg/L): 2.4, Albumin (g/dL): 3.2, ISS: Stage II, High-risk cytogenetics: Unknown, LDH: Normal - Signed by Lanny Callander, MD on 08/02/2020 Beta 2 microglobulin range (mg/L): Less than 3.5 Albumin range (g/dL): Less than 3.5 Cytogenetics: Unknown    Multiple myeloma in remission (HCC)  07/03/2020 Imaging   MRI Lumbar Spine  IMPRESSION: 1. Numerous T1 hypointense and STIR hyperintense lesions throughout the visualized spine and sacrum with multiple areas of extraosseous extension in the sacrum, detailed above and compatible with multiple myeloma given the clinical history. 2. An MRI of the pelvis with contrast could evaluate the full extent of the partially imaged sacral lesions, including left S1-S2 neural foraminal involvement and suspected involvement of the exited left L5 nerve. 3. Multilevel degenerative change without significant canal or foraminal stenosis in the lumbar spine.   07/13/2020 Initial Diagnosis   Multiple myeloma (HCC)   07/20/2020 Cancer Staging   Staging form:  Plasma Cell Myeloma and Plasma Cell Disorders, AJCC 8th Edition - Clinical stage from 07/20/2020: Beta-2 -microglobulin (mg/L): 2.4, Albumin (g/dL): 3.2, ISS: Stage II, High-risk cytogenetics: Unknown, LDH: Normal - Signed by Lanny Callander, MD on 03/09/2022 Stage prefix: Initial diagnosis Beta 2 microglobulin range (mg/L): Less than 3.5 Albumin range (g/dL): Less than 3.5 Cytogenetics: No abnormalities Bone disease on imaging: Present   07/24/2020 PET scan   IMPRESSION: 1. Moderate to high hypermetabolic activity associated with the expansile soft tissue masses within the LEFT and RIGHT pelvic bones is consistent with active multiple myeloma / plasmacytoma. 2. Lytic lesion in the LEFT scapula with mild metabolic activity is indeterminate. 3. Metabolic activity associated with the RIGHT knee prosthetic and RIGHT distal foot fusion is favored post procedural inflammation.     07/30/2020 - 08/12/2020 Radiation Therapy   Palliative Radiation to Pelvic lesions with Dr Dewey    07/30/2020 Pathology Results   DIAGNOSIS:   BONE MARROW, ASPIRATE, CLOT, CORE:  -Normocellular to slightly hypercellular bone marrow for age with  plasmacytosis  -See comment   PERIPHERAL BLOOD:  -Microcytic-normochromic anemia   COMMENT:   The bone marrow shows increased number of atypical plasma cells  representing 31% of all cells in the aspirate associated with prominent  interstitial infiltrates and numerous variably sized clusters in the  clot and biopsy sections.  The findings are most consistent with  persistent/recurrent/previously known plasma cell neoplasm.  For  completeness, immunohistochemical stain for CD138 and in situ  hybridization for kappa and lambda will be performed and the results  reported in an addendum.  Correlation with cytogenetic and FISH studies  is recommended.   08/03/2020 -  Chemotherapy   Zometa  q4weeks starting 08/03/20    08/17/2020 - 11/29/2020  Chemotherapy   Velcade  weekly,  Revlimid , dex 20mg  weekly (VRd)  Starting 08/17/20. Last dose Velcade  11/23/20 and last dose revlimid  on 11/29/20       Discussed the use of AI scribe software for clinical note transcription with the patient, who gave verbal consent to proceed.  History of Present Illness Hannah [name] is a 63 year old female with multiple myeloma who presents for follow-up.  She is on Revlimid  for maintenance therapy and experiences digestive issues, particularly diarrhea, during her week off the medication. She has intermittent back pain in the same area as previous lesions, occurring more frequently but not constantly, managed with Aleve . An MRI from November last year showed no new bone lesions but revealed lumbar spondylosis and degenerative changes at L5 and S1.  She experiences cognitive difficulties, including trouble focusing, memory issues, and feeling overwhelmed, which she attributes to 'chemo brain.' She has been back at work for over a year but finds tasks that were previously simple now require more effort.     All other systems were reviewed with the patient and are negative.  MEDICAL HISTORY:  Past Medical History:  Diagnosis Date   Allergy    Anemia    Anxiety    Arthritis    Asthma    Cancer (HCC)    multiple myeloma   Depression    Hypertension     SURGICAL HISTORY: Past Surgical History:  Procedure Laterality Date   ABDOMINAL HYSTERECTOMY     BICEPT TENODESIS Right 01/31/2023   Procedure: BICEPS TENODESIS;  Surgeon: Addie Cordella Hamilton, MD;  Location: [facility] OR;  Service: Orthopedics;  Laterality: Right;   CHOLECYSTECTOMY  1993   COLONOSCOPY     HERNIA REPAIR     REVERSE SHOULDER ARTHROPLASTY Right 01/31/2023   Procedure: REVERSE SHOULDER ARTHROPLASTY;  Surgeon: Addie Cordella Hamilton, MD;  Location: [facility] OR;  Service: Orthopedics;  Laterality: Right;   TOTAL KNEE ARTHROPLASTY Right 01/28/2016   TOTAL KNEE ARTHROPLASTY Right 01/28/2016   Procedure: RIGHT TOTAL KNEE ARTHROPLASTY;   Surgeon: Kay CHRISTELLA Cummins, MD;  Location: [facility] OR;  Service: Orthopedics;  Laterality: Right;   TRIGGER FINGER RELEASE Right 06/18/2014   Procedure: RIGHT LONG FINGER TRIGGER RELEASE;  Surgeon: Kay Ozell Cummins, MD;  Location: [facility];  Service: Orthopedics;  Laterality: Right;   TUBAL LIGATION     VAGINAL HYSTERECTOMY Bilateral 11/04/2016   Procedure: HYSTERECTOMY VAGINAL with Bilateral Salpingectomy;  Surgeon: Raeanne Shanda SQUIBB, MD;  Location: [facility] ORS;  Service: Gynecology;  Laterality: Bilateral;   WEIL OSTEOTOMY Right 07/07/2020   Procedure: RIGHT FOOT WEIL OSTEOTOMY 2, 3, AND 4 METATARSALS AND PROXIMAL INTERPHALANGEAL JOINT FUSION 2 & 4 TOES;  Surgeon: Harden Jerona GAILS, MD;  Location: [facility];  Service: Orthopedics;  Laterality: Right;    I have reviewed the social history and family history with the patient and they are unchanged from previous note.  ALLERGIES:  is allergic to elemental sulfur and sulfa antibiotics.  MEDICATIONS:  Current Outpatient Medications  Medication Sig Dispense Refill   acetaminophen  (TYLENOL ) 650 MG CR tablet 2 tablets as needed Orally every 8 hrs     AIRSUPRA 90-80 MCG/ACT AERO 2 puffs as needed Inhalation Six times a day for 30 days     aspirin  EC 81 MG tablet Take 81 mg by mouth daily. Swallow whole.     calcium  carbonate (OSCAL) 1500 (600 Ca) MG TABS tablet Take 600 mg of elemental calcium  by mouth daily.  Cholecalciferol (VITAMIN D3) 1.25 MG (50000 UT) CAPS Take 1 capsule by mouth every Sunday.     docusate sodium  (COLACE) 100 MG capsule Take 1 capsule (100 mg total) by mouth 2 (two) times daily. (Patient not taking: Reported on 12/13/2023) 10 capsule 0   ferrous sulfate  325 (65 FE) MG tablet Take 325 mg by mouth 2 (two) times daily with a meal.     lenalidomide  (REVLIMID ) 10 MG capsule Take 1 capsule (10 mg total) by mouth daily. [employer] Auth #[id number]   Date Obtained 12/13/2023 TAKE 1 CAPSULE BY MOUTH 1 TIME A DAY FOR 21 DAYS  ON THEN 7 DAYS OFF for a 28-day cycle  Dispense the BRAND Name Revlimid  ONLY due to copay assistance 21 capsule 0   methocarbamol  (ROBAXIN ) 500 MG tablet Take 1 tablet (500 mg total) by mouth every 8 (eight) hours as needed for muscle spasms. 30 tablet 0   montelukast (SINGULAIR) 10 MG tablet TAKE 1 TABLET BY MOUTH EVERY DAY FOR 30 DAYS for 90     Na Sulfate-K Sulfate-Mg Sulfate concentrate (SUPREP) 17.5-3.13-1.6 GM/177ML SOLN Take 1 kit (354 mLs total) by mouth once for 1 dose. May use generic Suprep, no Prior Authorization; Use Singlecare or Good RX. 354 mL 0   naproxen  (NAPROSYN ) 250 MG tablet Take 1 tablet (250 mg total) by mouth 2 (two) times daily with a meal. 30 tablet 0   oxyCODONE  (OXY IR/ROXICODONE ) 5 MG immediate release tablet Take 1 tablet (5 mg total) by mouth every 4 (four) hours as needed for moderate pain (pain score 4-6). 30 tablet 0   sertraline  (ZOLOFT ) 50 MG tablet Take 50 mg by mouth daily.     valACYclovir  (VALTREX ) 500 MG tablet Take 500 mg by mouth 2 (two) times daily.     No current facility-administered medications for this visit.    PHYSICAL EXAMINATION: ECOG PERFORMANCE STATUS: 1 - Symptomatic but completely ambulatory  Vitals:   12/13/23 1124  BP: 126/76  Pulse: 98  Resp: 15  Temp: (!) 97.2 F (36.2 C)  SpO2: 99%   Wt Readings from Last 3 Encounters:  12/13/23 214 lb 6.4 oz (97.3 kg)  12/13/23 210 lb (95.3 kg)  08/02/23 222 lb 1.6 oz (100.7 kg)     GENERAL:alert, no distress and comfortable SKIN: skin color, texture, turgor are normal, no rashes or significant lesions EYES: normal, Conjunctiva are pink and non-injected, sclera clear Musculoskeletal:no cyanosis of digits and no clubbing  NEURO: alert & oriented x 3 with fluent speech, no focal motor/sensory deficits  Physical Exam    LABORATORY DATA:  I have reviewed the data as listed    Latest Ref Rng & Units 12/13/2023   10:55 AM 08/02/2023    8:39 AM 05/03/2023    8:17 AM  CBC  WBC 4.0 -  10.5 K/uL 5.3  4.4  6.4   Hemoglobin 12.0 - 15.0 g/dL 88.5  88.6  87.8   Hematocrit 36.0 - 46.0 % 34.4  34.2  37.9   Platelets 150 - 400 K/uL 276  199  181         Latest Ref Rng & Units 12/13/2023   10:55 AM 08/02/2023    8:39 AM 05/03/2023    8:17 AM  CMP  Glucose 70 - 99 mg/dL 92  880  887   BUN 6 - 20 mg/dL 15  15  9    Creatinine 0.44 - 1.00 mg/dL 9.16  9.18  9.13   Sodium 135 - 145 mmol/L  142  141  141   Potassium 3.5 - 5.1 mmol/L 3.5  3.5  4.1   Chloride 98 - 111 mmol/L 106  106  108   CO2 22 - 32 mmol/L 30  29  28    Calcium  8.9 - 10.3 mg/dL 8.8  8.7  9.1   Total Protein 6.5 - 8.1 g/dL 7.3  7.0  7.3   Total Bilirubin 0.0 - 1.2 mg/dL 0.4  0.5  0.5   Alkaline Phos 38 - 126 U/L 68  79  78   AST 15 - 41 U/L 13  12  12    ALT 0 - 44 U/L 11  8  10        RADIOGRAPHIC STUDIES: I have personally reviewed the radiological images as listed and agreed with the findings in the report. No results found.    No orders of the defined types were placed in this encounter.  All questions were answered. The patient knows to call the clinic with any problems, questions or concerns. No barriers to learning was detected. The total time spent in the appointment was 30 minutes, including review of chart and various tests results, discussions about plan of care and coordination of care plan     Onita Mattock, MD 12/13/2023

## 2023-12-13 NOTE — Progress Notes (Signed)

## 2023-12-14 LAB — KAPPA/LAMBDA LIGHT CHAINS
Kappa free light chain: 34.9 mg/L — ABNORMAL HIGH (ref 3.3–19.4)
Kappa, lambda light chain ratio: 1.75 — ABNORMAL HIGH (ref 0.26–1.65)
Lambda free light chains: 20 mg/L (ref 5.7–26.3)

## 2023-12-15 ENCOUNTER — Other Ambulatory Visit: Payer: Self-pay

## 2023-12-18 LAB — MULTIPLE MYELOMA PANEL, SERUM
Albumin SerPl Elph-Mcnc: 3.7 g/dL (ref 2.9–4.4)
Albumin/Glob SerPl: 1.2 (ref 0.7–1.7)
Alpha 1: 0.2 g/dL (ref 0.0–0.4)
Alpha2 Glob SerPl Elph-Mcnc: 0.7 g/dL (ref 0.4–1.0)
B-Globulin SerPl Elph-Mcnc: 1 g/dL (ref 0.7–1.3)
Gamma Glob SerPl Elph-Mcnc: 1.4 g/dL (ref 0.4–1.8)
Globulin, Total: 3.2 g/dL (ref 2.2–3.9)
IgA: 253 mg/dL (ref 87–352)
IgG (Immunoglobin G), Serum: 1591 mg/dL (ref 586–1602)
IgM (Immunoglobulin M), Srm: 31 mg/dL (ref 26–217)
Total Protein ELP: 6.9 g/dL (ref 6.0–8.5)

## 2023-12-22 ENCOUNTER — Encounter: Payer: Self-pay | Admitting: Hematology

## 2023-12-25 ENCOUNTER — Other Ambulatory Visit (HOSPITAL_COMMUNITY): Payer: Self-pay

## 2023-12-26 ENCOUNTER — Telehealth: Payer: Self-pay

## 2023-12-26 NOTE — Telephone Encounter (Signed)
 Oral Oncology Patient Advocate Encounter   Spoke with [facility] to obtain copay assistance for Lenalidomide .     Patient's copay is now $0.00    Home delivery is expected 12/27/23     Charlott Hamilton,  CPhT-Adv  she/her/hers [facility]  [facility] Pharmacy Technician Patient Advocate Specialist III [facility] Phone: [phone]  Fax: [phone] [email or web address]

## 2023-12-28 DIAGNOSIS — R053 Chronic cough: Secondary | ICD-10-CM | POA: Diagnosis not present

## 2023-12-29 ENCOUNTER — Ambulatory Visit: Admitting: Orthopedic Surgery

## 2023-12-29 ENCOUNTER — Encounter: Payer: Self-pay | Admitting: Orthopedic Surgery

## 2023-12-29 DIAGNOSIS — M79601 Pain in right arm: Secondary | ICD-10-CM

## 2023-12-29 NOTE — Progress Notes (Signed)
 Office Visit Note   Patient: Hannah [name]           Date of Birth: [birth year]           MRN: [id number] Visit Date: 12/29/2023 Requested by: Tisovec, Richard W, MD [address] [place],  [place] [address] PCP: Tisovec, Charlie ORN, MD  Subjective: Chief Complaint  Patient presents with   Right Shoulder - Follow-up    Review scan    HPI: Hannah [name] is a 63 y.o. female who presents to the office reporting right shoulder pain.  She had a fall last November and has been having some pain since then.  Primarily scapular region which comes and goes.  Overall trending better.  Does have some soreness in the shoulder when laying down.  Nighttime is the worst for her.  She has a history of multiple myeloma which is under good control.  CT scan is reviewed.  Hardware in good position with no lucency or complicating features.  Nondisplaced coracoid fracture is present which is not really in the area of her maximal tenderness or symptoms..                ROS: All systems reviewed are negative as they relate to the chief complaint within the history of present illness.  Patient denies fevers or chills.  Assessment & Plan: Visit Diagnoses:  1. Right arm pain     Plan: Impression is right shoulder pain with possible radicular component.  Given her history of multiple myeloma MRI C-spine indicated to evaluate right-sided radiculopathy.  Follow-up after that study.  May be bad enough that she would consider injections in her neck.  Overall her symptoms are improving but I think would be good to put the issue to rest in terms of her neck.  No real acromial tenderness today and no evidence of stress reaction or stress fracture on the CT scan.  Follow-Up Instructions: No follow-ups on file.   Orders:  Orders Placed This Encounter  Procedures   MR Cervical Spine w/o contrast   No orders of the defined types were placed in this encounter.     Procedures: No procedures performed   Clinical  Data: No additional findings.  Objective: Vital Signs: LMP 10/22/2016 (Exact Date)   Physical Exam:  Constitutional: Patient appears well-developed HEENT:  Head: Normocephalic Eyes:EOM are normal Neck: Normal range of motion Cardiovascular: Normal rate Pulmonary/chest: Effort normal Neurologic: Patient is alert Skin: Skin is warm Psychiatric: Patient has normal mood and affect  Ortho Exam: Ortho exam demonstrates good rotator cuff strength below shoulder level to internal/external rotation.  Deltoid fires nicely.  Incision intact.  No coarseness or popping with range of motion.  Fairly minimal tenderness to palpation over the coracoid.  No tenderness to palpation over the acromion.  Range of motion is 60/95/140 with very good subscap strength and external rotation strength.  Specialty Comments:  No specialty comments available.  Imaging: No results found.   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   Major depression, recurrent (HCC) 01/12/2018   Chronic right shoulder pain 12/20/2017   MDD (major depressive disorder), recurrent episode, moderate (  HCC) 11/04/2017   Primary osteoarthritis of both hands 09/26/2017   Primary osteoarthritis of right shoulder 09/26/2017   Primary osteoarthritis of both hips 09/26/2017   Status post total knee replacement, right 09/26/2017   Primary osteoarthritis of both feet 09/26/2017   History of asthma 09/26/2017   Primary osteoarthritis of left knee 04/18/2017   Menorrhagia 11/04/2016   Fibroid tumor 11/04/2016   Adenomyosis 11/04/2016   Hypertension, essential 11/04/2016   Total knee replacement  status 01/28/2016   Past Medical History:  Diagnosis Date   Allergy    Anemia    Anxiety    Arthritis    Asthma    Cancer (HCC)    multiple myeloma   Depression    Hypertension     Family History  Problem Relation Age of Onset   Cancer Mother        uterine    Asthma Mother    Depression Maternal Uncle    Arthritis Daughter    Colon cancer Neg Hx    Colon polyps Neg Hx    Diabetes Neg Hx    Rectal cancer Neg Hx    Stomach cancer Neg Hx     Past Surgical History:  Procedure Laterality Date   ABDOMINAL HYSTERECTOMY     BICEPT TENODESIS Right 01/31/2023   Procedure: BICEPS TENODESIS;  Surgeon: Addie Cordella Hamilton, MD;  Location: [facility] OR;  Service: Orthopedics;  Laterality: Right;   CHOLECYSTECTOMY  1993   COLONOSCOPY     HERNIA REPAIR     REVERSE SHOULDER ARTHROPLASTY Right 01/31/2023   Procedure: REVERSE SHOULDER ARTHROPLASTY;  Surgeon: Addie Cordella Hamilton, MD;  Location: [facility] OR;  Service: Orthopedics;  Laterality: Right;   TOTAL KNEE ARTHROPLASTY Right 01/28/2016   TOTAL KNEE ARTHROPLASTY Right 01/28/2016   Procedure: RIGHT TOTAL KNEE ARTHROPLASTY;  Surgeon: Kay CHRISTELLA Cummins, MD;  Location: [facility] OR;  Service: Orthopedics;  Laterality: Right;   TRIGGER FINGER RELEASE Right 06/18/2014   Procedure: RIGHT LONG FINGER TRIGGER RELEASE;  Surgeon: Kay Ozell Cummins, MD;  Location: [facility];  Service: Orthopedics;  Laterality: Right;   TUBAL LIGATION     VAGINAL HYSTERECTOMY Bilateral 11/04/2016   Procedure: HYSTERECTOMY VAGINAL with Bilateral Salpingectomy;  Surgeon: Raeanne Shanda SQUIBB, MD;  Location: [facility];  Service: Gynecology;  Laterality: Bilateral;   WEIL OSTEOTOMY Right 07/07/2020   Procedure: RIGHT FOOT WEIL OSTEOTOMY 2, 3, AND 4 METATARSALS AND PROXIMAL INTERPHALANGEAL JOINT FUSION 2 & 4 TOES;  Surgeon: Harden Jerona GAILS, MD;  Location: [facility];  Service: Orthopedics;  Laterality: Right;   Social History   Occupational History   Not on file   Tobacco Use   Smoking status: Never   Smokeless tobacco: Never  Vaping Use   Vaping status: Never Used  Substance and Sexual Activity   Alcohol  use: No    Alcohol /week: 0.0 standard drinks of alcohol    Drug use: No   Sexual activity: Yes    Partners: Male    Birth control/protection: Surgical

## 2024-01-03 ENCOUNTER — Encounter: Payer: Self-pay | Admitting: Internal Medicine

## 2024-01-03 ENCOUNTER — Ambulatory Visit (AMBULATORY_SURGERY_CENTER): Admitting: Internal Medicine

## 2024-01-03 VITALS — BP 105/78 | HR 74 | Temp 97.7°F | Resp 16 | Ht 65.0 in | Wt 210.0 lb

## 2024-01-03 DIAGNOSIS — Z1211 Encounter for screening for malignant neoplasm of colon: Secondary | ICD-10-CM | POA: Diagnosis not present

## 2024-01-03 MED ORDER — SODIUM CHLORIDE 0.9 % IV SOLN: 500.0000 mL | Freq: Once | INTRAVENOUS | Status: DC

## 2024-01-03 NOTE — Patient Instructions (Signed)
 YOU HAD AN ENDOSCOPIC PROCEDURE TODAY AT THE  ENDOSCOPY CENTER:   Refer to the procedure report that was given to you for any specific questions about what was found during the examination.  If the procedure report does not answer your questions, please call your gastroenterologist to clarify.  If you requested that your care partner not be given the details of your procedure findings, then the procedure report has been included in a sealed envelope for you to review at your convenience later.  YOU SHOULD EXPECT: Some feelings of bloating in the abdomen. Passage of more gas than usual.  Walking can help get rid of the air that was put into your GI tract during the procedure and reduce the bloating. If you had a lower endoscopy (such as a colonoscopy or flexible sigmoidoscopy) you may notice spotting of blood in your stool or on the toilet paper. If you underwent a bowel prep for your procedure, you may not have a normal bowel movement for a few days.  Please Note:  You might notice some irritation and congestion in your nose or some drainage.  This is from the oxygen used during your procedure.  There is no need for concern and it should clear up in a day or so.  SYMPTOMS TO REPORT IMMEDIATELY:  Following lower endoscopy (colonoscopy or flexible sigmoidoscopy):  Excessive amounts of blood in the stool  Significant tenderness or worsening of abdominal pains  Swelling of the abdomen that is new, acute  Fever of 100F or higher  Resume previous diet Continue present medications   For urgent or emergent issues, a gastroenterologist can be reached at any hour by calling [phone]. Do not use MyChart messaging for urgent concerns.    DIET:  We do recommend a small meal at first, but then you may proceed to your regular diet.  Drink plenty of fluids but you should avoid alcoholic beverages for 24 hours.  ACTIVITY:  You should plan to take it easy for the rest of today and you should NOT  DRIVE or use heavy machinery until tomorrow (because of the sedation medicines used during the test).    FOLLOW UP: Our staff will call the number listed on your records the next business day following your procedure.  We will call around 7:15- 8:00 am to check on you and address any questions or concerns that you may have regarding the information given to you following your procedure. If we do not reach you, we will leave a message.     If any biopsies were taken you will be contacted by phone or by letter within the next 1-3 weeks.  Please call us  at [phone] if you have not heard about the biopsies in 3 weeks.    SIGNATURES/CONFIDENTIALITY: You and/or your care partner have signed paperwork which will be entered into your electronic medical record.  These signatures attest to the fact that that the information above on your After Visit Summary has been reviewed and is understood.  Full responsibility of the confidentiality of this discharge information lies with you and/or your care-partner.

## 2024-01-03 NOTE — Progress Notes (Signed)
 Sedate, gd SR, tolerated procedure well, VSS, report to RN

## 2024-01-03 NOTE — Progress Notes (Signed)
 HISTORY OF PRESENT ILLNESS:  Hannah [name] is a 63 y.o. female who is sent directly for screening colonoscopy.  Previous exam with Dr. Teressa 2015 was normal  REVIEW OF SYSTEMS:  All non-GI ROS negative except for  Past Medical History:  Diagnosis Date   Allergy    Anemia    Anxiety    Arthritis    Asthma    Cancer (HCC)    multiple myeloma   Depression    Hypertension     Past Surgical History:  Procedure Laterality Date   ABDOMINAL HYSTERECTOMY     BICEPT TENODESIS Right 01/31/2023   Procedure: BICEPS TENODESIS;  Surgeon: Addie Cordella Hamilton, MD;  Location: [facility] OR;  Service: Orthopedics;  Laterality: Right;   CHOLECYSTECTOMY  1993   COLONOSCOPY     HERNIA REPAIR     REVERSE SHOULDER ARTHROPLASTY Right 01/31/2023   Procedure: REVERSE SHOULDER ARTHROPLASTY;  Surgeon: Addie Cordella Hamilton, MD;  Location: [facility] OR;  Service: Orthopedics;  Laterality: Right;   TOTAL KNEE ARTHROPLASTY Right 01/28/2016   TOTAL KNEE ARTHROPLASTY Right 01/28/2016   Procedure: RIGHT TOTAL KNEE ARTHROPLASTY;  Surgeon: Kay CHRISTELLA Cummins, MD;  Location: [facility] OR;  Service: Orthopedics;  Laterality: Right;   TRIGGER FINGER RELEASE Right 06/18/2014   Procedure: RIGHT LONG FINGER TRIGGER RELEASE;  Surgeon: Kay Ozell Cummins, MD;  Location: [facility];  Service: Orthopedics;  Laterality: Right;   TUBAL LIGATION     VAGINAL HYSTERECTOMY Bilateral 11/04/2016   Procedure: HYSTERECTOMY VAGINAL with Bilateral Salpingectomy;  Surgeon: Raeanne Shanda SQUIBB, MD;  Location: [facility] ORS;  Service: Gynecology;  Laterality: Bilateral;   WEIL OSTEOTOMY Right 07/07/2020   Procedure: RIGHT FOOT WEIL OSTEOTOMY 2, 3, AND 4 METATARSALS AND PROXIMAL INTERPHALANGEAL JOINT FUSION 2 & 4 TOES;  Surgeon: Harden Jerona GAILS, MD;  Location: [facility];  Service: Orthopedics;  Laterality: Right;    Social History Hannah [name]  reports that she has never smoked. She has never used smokeless tobacco. She reports that she does  not drink alcohol  and does not use drugs.  family history includes Arthritis in her daughter; Asthma in her mother; Cancer in her mother; Depression in her maternal uncle.  Allergies  Allergen Reactions   Elemental Sulfur Swelling    Facial swelling    Sulfa Antibiotics Swelling       PHYSICAL EXAMINATION: Vital signs: BP 129/66   Pulse 64   Temp 97.7 F (36.5 C)   Resp 19   Ht 5' 5 (1.651 m)   Wt 210 lb (95.3 kg)   LMP 10/22/2016 (Exact Date)   SpO2 99%   BMI 34.95 kg/m  General: Well-developed, well-nourished, no acute distress HEENT: Sclerae are anicteric, conjunctiva pink. Oral mucosa intact Lungs: Clear Heart: Regular Abdomen: soft, nontender, nondistended, no obvious ascites, no peritoneal signs, normal bowel sounds. No organomegaly. Extremities: No edema Psychiatric: alert and oriented x3. Cooperative     ASSESSMENT:  Colon cancer screening   PLAN: Screening colonoscopy

## 2024-01-03 NOTE — Progress Notes (Signed)
 Pt's states no medical or surgical changes since previsit or office visit.

## 2024-01-03 NOTE — Op Note (Signed)
 [facility] Patient Name: Hannah [name] Procedure Date: 01/03/2024 7:21 AM MRN: [id number] Endoscopist: Norleen SAILOR. Abran , MD, [id number] Age: 63 Referring MD:  Date of Birth: 1960-08-11 Gender: Female Account #: [id number] Procedure:                Colonoscopy Indications:              Screening for colorectal malignant neoplasm Medicines:                Monitored Anesthesia Care Procedure:                Pre-Anesthesia Assessment:                           - Prior to the procedure, a History and Physical                            was performed, and patient medications and                            allergies were reviewed. The patient's tolerance of                            previous anesthesia was also reviewed. The risks                            and benefits of the procedure and the sedation                            options and risks were discussed with the patient.                            All questions were answered, and informed consent                            was obtained. Prior Anticoagulants: The patient has                            taken no anticoagulant or antiplatelet agents. ASA                            Grade Assessment: II - A patient with mild systemic                            disease. After reviewing the risks and benefits,                            the patient was deemed in satisfactory condition to                            undergo the procedure.                           After obtaining informed consent, the colonoscope  was passed under direct vision. Throughout the                            procedure, the patient's blood pressure, pulse, and                            oxygen saturations were monitored continuously. The                            CF [id number] #[id number] was introduced through the anus                            and advanced to the the cecum, identified by                            appendiceal orifice  and ileocecal valve. The                            ileocecal valve, appendiceal orifice, and rectum                            were photographed. The quality of the bowel                            preparation was excellent. The colonoscopy was                            performed without difficulty. The patient tolerated                            the procedure well. The bowel preparation used was                            SUPREP via split dose instruction. Scope In: 8:31:41 AM Scope Out: 8:43:41 AM Scope Withdrawal Time: 0 hours 9 minutes 42 seconds  Total Procedure Duration: 0 hours 12 minutes 0 seconds  Findings:                 The entire examined colon appeared normal on direct                            and retroflexion views. Complications:            No immediate complications. Estimated blood loss:                            None. Estimated Blood Loss:     Estimated blood loss: none. Impression:               - The entire examined colon is normal on direct and                            retroflexion views.                           - No  specimens collected. Recommendation:           - Repeat colonoscopy in 10 years for screening                            purposes.                           - Patient has a contact number available for                            emergencies. The signs and symptoms of potential                            delayed complications were discussed with the                            patient. Return to normal activities tomorrow.                            Written discharge instructions were provided to the                            patient.                           - Resume previous diet.                           - Continue present medications. Norleen SAILOR. Abran, MD 01/03/2024 8:57:24 AM This report has been signed electronically.

## 2024-01-04 ENCOUNTER — Telehealth: Payer: Self-pay | Admitting: *Deleted

## 2024-01-04 NOTE — Telephone Encounter (Signed)
  Follow up Call-     01/03/2024    7:29 AM  Call back number  Post procedure Call Back phone  # [phone]  Permission to leave phone message Yes     Patient questions:  Do you have a fever, pain , or abdominal swelling? No. Pain Score  0 *  Have you tolerated food without any problems? Yes.    Have you been able to return to your normal activities? Yes.    Do you have any questions about your discharge instructions: Diet   No. Medications  No. Follow up visit  No.  Do you have questions or concerns about your Care? No.  Actions: * If pain score is 4 or above: No action needed, pain <4.

## 2024-01-09 ENCOUNTER — Ambulatory Visit
Admission: RE | Admit: 2024-01-09 | Discharge: 2024-01-09 | Disposition: A | Discharge status: Home / Self Care | Source: Ambulatory Visit | Attending: Orthopedic Surgery | Admitting: Orthopedic Surgery

## 2024-01-09 DIAGNOSIS — M79601 Pain in right arm: Secondary | ICD-10-CM

## 2024-01-10 DIAGNOSIS — R053 Chronic cough: Secondary | ICD-10-CM | POA: Diagnosis not present

## 2024-01-16 ENCOUNTER — Other Ambulatory Visit: Payer: Self-pay | Admitting: Hematology

## 2024-01-16 ENCOUNTER — Other Ambulatory Visit: Payer: Self-pay

## 2024-01-16 MED ORDER — LENALIDOMIDE 10 MG PO CAPS: 10.0000 mg | ORAL_CAPSULE | Freq: Every day | ORAL | 0 refills | Status: DC

## 2024-01-19 DIAGNOSIS — J4540 Moderate persistent asthma, uncomplicated: Secondary | ICD-10-CM | POA: Diagnosis not present

## 2024-01-19 DIAGNOSIS — R053 Chronic cough: Secondary | ICD-10-CM | POA: Diagnosis not present

## 2024-01-24 ENCOUNTER — Ambulatory Visit (INDEPENDENT_AMBULATORY_CARE_PROVIDER_SITE_OTHER): Admitting: Orthopedic Surgery

## 2024-01-24 DIAGNOSIS — M79601 Pain in right arm: Secondary | ICD-10-CM | POA: Diagnosis not present

## 2024-01-25 ENCOUNTER — Encounter: Payer: Self-pay | Admitting: Orthopedic Surgery

## 2024-01-25 NOTE — Progress Notes (Signed)
 Office Visit Note   Patient: Hannah [name]           Date of Birth: 1960-12-25           MRN: [id number] Visit Date: 01/24/2024 Requested by: Tisovec, Richard W, MD [address] [place],  [place] [address] PCP: Tisovec, Charlie ORN, MD  Subjective: Chief Complaint  Patient presents with   Neck - Follow-up    HPI: Hannah [name] is a 63 y.o. female who presents to the office reporting neck as well as scapular pain and right shoulder pain.  Patient had a fall last Thanksgiving.  CT scan of the reverse shoulder replacement looks good.  She is not taking any medications except Aleve .  That has not helped much.  Hurts her to sleep on that right-hand side.  All right-sided symptoms no left-sided symptoms.  Cervical spine MRI is reviewed.  Patient does have mild foraminal stenosis at C4-5 and C5-6.  The stenosis is a little bit worse on the left-hand side at C6-7.SABRA                ROS: All systems reviewed are negative as they relate to the chief complaint within the history of present illness.  Patient denies fevers or chills.  Assessment & Plan: Visit Diagnoses:  1. Right arm pain     Plan: Impression is right shoulder and scapular pain.  Shoulder itself does not really look to be adversely affected at this time.  Recommend diagnostic and therapeutic cervical spine injection.  I think she may have a little bit of right sided C3-4 radiculopathy.  Follow-up 2 to 3 weeks after that injection  Follow-Up Instructions: No follow-ups on file.   Orders:  Orders Placed This Encounter  Procedures   Ambulatory referral to Physical Medicine Rehab   No orders of the defined types were placed in this encounter.     Procedures: No procedures performed   Clinical Data: No additional findings.  Objective: Vital Signs: LMP 10/22/2016 (Exact Date)   Physical Exam:  Constitutional: Patient appears well-developed HEENT:  Head: Normocephalic Eyes:EOM are normal Neck: Normal range of  motion Cardiovascular: Normal rate Pulmonary/chest: Effort normal Neurologic: Patient is alert Skin: Skin is warm Psychiatric: Patient has normal mood and affect  Ortho Exam: Ortho exam demonstrates pretty good cervical spine range of motion.  She has good rotator cuff strength infraspinatus supraspinatus and subscap muscle testing.  Shoulder forward flexion abduction both above 90 degrees.  Motor or sensory function of the hand is intact.  EPL FPL interosseous recessions extension biceps triceps and deltoid strength is intact.  No coarseness or grinding with passive range of motion of the right shoulder.  Specialty Comments:  CLINICAL DATA:  Right radicular pain   EXAM: MRI CERVICAL SPINE WITHOUT CONTRAST   TECHNIQUE: Multiplanar, multisequence MR imaging of the cervical spine was performed. No intravenous contrast was administered.   COMPARISON:  MRI of the cervical spine dated 09/12/2019   FINDINGS: Alignment: Grade 1 anterolisthesis of C4 on C5.   Vertebrae: Degenerative endplate marrow changes at a few levels. No acute fracture is identified.   Cord: Normal signal and morphology.   Posterior Fossa, vertebral arteries, paraspinal tissues: The visualized portions of the skull base and the posterior fossa are normal. No soft tissue abnormality is identified.   Disc levels:   C2-C3: The disk is normal in configuration. Mild bilateral facet arthropathy. No uncovertebral joint disease. No neuroforaminal stenosis. No spinal canal stenosis.   C3-C4:  Disc osteophyte complex. Mild bilateral facet arthropathy. Mild bilateral uncovertebral joint disease. No neuroforaminal stenosis. Mild spinal canal stenosis.   C4-C5: Disc osteophyte complex. Mild bilateral facet arthropathy. No uncovertebral joint disease. No neuroforaminal stenosis. No spinal canal stenosis.   C5-C6: Disc osteophyte complex. No facet arthropathy. Mild right uncovertebral joint disease. Mild right  neuroforaminal stenosis. No spinal canal stenosis.   C6-C7: Disc osteophyte complex. No facet arthropathy. Severe left uncovertebral joint disease. Severe left neuroforaminal stenosis. No spinal canal stenosis.   C7-T1: Disc osteophyte complex. Mild facet arthropathy. Mild bilateral uncovertebral joint disease. Mild bilateral neuroforaminal stenosis. No spinal canal stenosis.   IMPRESSION: 1. 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 Mild foraminal stenoses at C5-C6 and C7-T1. 2. Mild canal stenosis at C3-C4 secondary to disc osteophyte complex     Electronically Signed   By: Clem Savory M.D.   On: 01/16/2024 16:35  Imaging: No results found.   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   Major depression, recurrent (HCC) 01/12/2018   Chronic right shoulder pain 12/20/2017   MDD (major depressive disorder), recurrent episode, moderate (HCC) 11/04/2017   Primary osteoarthritis of both hands 09/26/2017   Primary osteoarthritis of right shoulder 09/26/2017   Primary osteoarthritis of both hips 09/26/2017   Status post total knee replacement, right 09/26/2017   Primary osteoarthritis of both feet 09/26/2017   History of asthma 09/26/2017   Primary osteoarthritis of left knee 04/18/2017   Menorrhagia 11/04/2016   Fibroid tumor 11/04/2016   Adenomyosis 11/04/2016   Hypertension, essential 11/04/2016   Total knee replacement status 01/28/2016   Past Medical History:  Diagnosis Date   Allergy    Anemia    Anxiety    Arthritis    Asthma    Cancer (HCC)    multiple myeloma   Depression     Hypertension     Family History  Problem Relation Age of Onset   Cancer Mother        uterine    Asthma Mother    Depression Maternal Uncle    Arthritis Daughter    Colon cancer Neg Hx    Colon polyps Neg Hx    Diabetes Neg Hx    Rectal cancer Neg Hx    Stomach cancer Neg Hx     Past Surgical History:  Procedure Laterality Date   ABDOMINAL HYSTERECTOMY     BICEPT TENODESIS Right 01/31/2023   Procedure: BICEPS TENODESIS;  Surgeon: Addie Cordella Hamilton, MD;  Location: [facility] OR;  Service: Orthopedics;  Laterality: Right;   CHOLECYSTECTOMY  1993   COLONOSCOPY     HERNIA REPAIR     REVERSE SHOULDER ARTHROPLASTY Right 01/31/2023   Procedure: REVERSE SHOULDER ARTHROPLASTY;  Surgeon: Addie Cordella Hamilton, MD;  Location: [facility] OR;  Service: Orthopedics;  Laterality: Right;   TOTAL KNEE ARTHROPLASTY Right 01/28/2016   TOTAL KNEE ARTHROPLASTY Right 01/28/2016   Procedure: RIGHT TOTAL KNEE ARTHROPLASTY;  Surgeon: Kay CHRISTELLA Cummins, MD;  Location: [facility] OR;  Service: Orthopedics;  Laterality: Right;   TRIGGER FINGER RELEASE Right 06/18/2014   Procedure: RIGHT LONG FINGER TRIGGER RELEASE;  Surgeon: Kay Sharper  Jerri, MD;  Location: [facility];  Service: Orthopedics;  Laterality: Right;   TUBAL LIGATION     VAGINAL HYSTERECTOMY Bilateral 11/04/2016   Procedure: HYSTERECTOMY VAGINAL with Bilateral Salpingectomy;  Surgeon: Raeanne Shanda SQUIBB, MD;  Location: [facility];  Service: Gynecology;  Laterality: Bilateral;   WEIL OSTEOTOMY Right 07/07/2020   Procedure: RIGHT FOOT WEIL OSTEOTOMY 2, 3, AND 4 METATARSALS AND PROXIMAL INTERPHALANGEAL JOINT FUSION 2 & 4 TOES;  Surgeon: Harden Jerona GAILS, MD;  Location: [facility];  Service: Orthopedics;  Laterality: Right;   Social History   Occupational History   Not on file  Tobacco Use   Smoking status: Never   Smokeless tobacco: Never  Vaping Use   Vaping status: Never Used  Substance and Sexual Activity   Alcohol  use: No    Alcohol /week:  0.0 standard drinks of alcohol    Drug use: No   Sexual activity: Yes    Partners: Male    Birth control/protection: Surgical

## 2024-01-31 ENCOUNTER — Encounter: Payer: Self-pay | Admitting: Hematology

## 2024-02-05 DIAGNOSIS — R238 Other skin changes: Secondary | ICD-10-CM | POA: Diagnosis not present

## 2024-02-07 ENCOUNTER — Ambulatory Visit: Admitting: Hematology

## 2024-02-07 ENCOUNTER — Other Ambulatory Visit

## 2024-02-14 ENCOUNTER — Other Ambulatory Visit: Payer: Self-pay | Admitting: Hematology

## 2024-02-14 ENCOUNTER — Other Ambulatory Visit: Payer: Self-pay

## 2024-02-14 MED ORDER — LENALIDOMIDE 10 MG PO CAPS: 10.0000 mg | ORAL_CAPSULE | Freq: Every day | ORAL | 0 refills | Status: DC

## 2024-02-20 ENCOUNTER — Ambulatory Visit (INDEPENDENT_AMBULATORY_CARE_PROVIDER_SITE_OTHER): Admitting: Physical Medicine and Rehabilitation

## 2024-02-20 ENCOUNTER — Other Ambulatory Visit: Payer: Self-pay

## 2024-02-20 VITALS — BP 146/90 | HR 83

## 2024-02-20 DIAGNOSIS — M5412 Radiculopathy, cervical region: Secondary | ICD-10-CM | POA: Diagnosis not present

## 2024-02-20 DIAGNOSIS — M5416 Radiculopathy, lumbar region: Secondary | ICD-10-CM

## 2024-02-20 MED ORDER — METHYLPREDNISOLONE ACETATE 80 MG/ML IJ SUSP
40.0000 mg | Freq: Once | INTRAMUSCULAR | Status: AC
  Administered 2024-02-20: 40 mg

## 2024-02-20 NOTE — Progress Notes (Signed)
 Pain Scale   Average Pain 5 Patient advising she has chronic neck pain radiating to right shoulder and pain is constant.        +Driver, -BT, -Dye Allergies.

## 2024-02-20 NOTE — Procedures (Signed)
 Cervical Epidural Steroid Injection - Interlaminar Approach with Fluoroscopic Guidance  Patient: Hannah [name]      Date of Birth: [birth year] MRN: [id number] PCP: Vernadine Charlie ORN, MD      Visit Date: 02/20/2024   Universal Protocol:    Date/Time: 09/23/258:35 AM  Consent Given By: the patient  Position: PRONE  Additional Comments: Vital signs were monitored before and after the procedure. Patient was prepped and draped in the usual sterile fashion. The correct patient, procedure, and site was verified.   Injection Procedure Details:   Procedure diagnoses: Cervical radiculopathy [M54.12]    Meds Administered:  Meds ordered this encounter  Medications   methylPREDNISolone  acetate (DEPO-MEDROL ) injection 40 mg     Laterality: Right  Location/Site: C7-T1  Needle: 3.5 in., 20 ga. Tuohy  Needle Placement: Paramedian epidural space  Findings:  -Comments: Excellent flow of contrast into the epidural space.  Procedure Details: Using a paramedian approach from the side mentioned above, the region overlying the inferior lamina was localized under fluoroscopic visualization and the soft tissues overlying this structure were infiltrated with 4 ml. of 1% Lidocaine  without Epinephrine . A # 20 gauge, Tuohy needle was inserted into the epidural space using a paramedian approach.  The epidural space was localized using loss of resistance along with contralateral oblique bi-planar fluoroscopic views.  After negative aspirate for air, blood, and CSF, a 2 ml. volume of Isovue-250 was injected into the epidural space and the flow of contrast was observed. Radiographs were obtained for documentation purposes.   The injectate was administered into the level noted above.  Additional Comments:  The patient tolerated the procedure well Dressing: 2 x 2 sterile gauze and Band-Aid    Post-procedure details: Patient was observed during the procedure. Post-procedure instructions were  reviewed.  Patient left the clinic in stable condition.

## 2024-02-20 NOTE — Progress Notes (Signed)
 Hannah [name] - 63 y.o. female MRN [id number]  Date of birth: Oct 18, 1960  Office Visit Note: Visit Date: 02/20/2024 PCP: Tisovec, Richard W, MD Referred by: Vernadine Charlie ORN, MD  Subjective: Chief Complaint  Patient presents with   Neck - Pain   HPI:  Hannah [name] is a 63 y.o. female who comes in today at the request of Dr. JUDITHANN Glendia Hutchinson for planned Right C7-T1 Cervical Interlaminar epidural steroid injection with fluoroscopic guidance.  The patient has failed conservative care including home exercise, medications, time and activity modification.  This injection will be diagnostic and hopefully therapeutic.  Please see requesting physician notes for further details and justification.   ROS Otherwise per HPI.  Assessment & Plan: Visit Diagnoses:    ICD-10-CM   1. Cervical radiculopathy  M54.12 XR C-ARM NO REPORT    Epidural Steroid injection    methylPREDNISolone  acetate (DEPO-MEDROL ) injection 40 mg    2. Lumbar radiculopathy  M54.16       Plan: No additional findings.   Meds & Orders:  Meds ordered this encounter  Medications   methylPREDNISolone  acetate (DEPO-MEDROL ) injection 40 mg    Orders Placed This Encounter  Procedures   XR C-ARM NO REPORT   Epidural Steroid injection    Follow-up: Return for visit to requesting provider as needed.   Procedures: No procedures performed  Cervical Epidural Steroid Injection - Interlaminar Approach with Fluoroscopic Guidance  Patient: Hannah [name]      Date of Birth: Sep 27, 1960 MRN: [id number] PCP: Vernadine Charlie ORN, MD      Visit Date: 02/20/2024   Universal Protocol:    Date/Time: 09/23/258:35 AM  Consent Given By: the patient  Position: PRONE  Additional Comments: Vital signs were monitored before and after the procedure. Patient was prepped and draped in the usual sterile fashion. The correct patient, procedure, and site was verified.   Injection Procedure Details:   Procedure diagnoses: Cervical  radiculopathy [M54.12]    Meds Administered:  Meds ordered this encounter  Medications   methylPREDNISolone  acetate (DEPO-MEDROL ) injection 40 mg     Laterality: Right  Location/Site: C7-T1  Needle: 3.5 in., 20 ga. Tuohy  Needle Placement: Paramedian epidural space  Findings:  -Comments: Excellent flow of contrast into the epidural space.  Procedure Details: Using a paramedian approach from the side mentioned above, the region overlying the inferior lamina was localized under fluoroscopic visualization and the soft tissues overlying this structure were infiltrated with 4 ml. of 1% Lidocaine  without Epinephrine . A # 20 gauge, Tuohy needle was inserted into the epidural space using a paramedian approach.  The epidural space was localized using loss of resistance along with contralateral oblique bi-planar fluoroscopic views.  After negative aspirate for air, blood, and CSF, a 2 ml. volume of Isovue-250 was injected into the epidural space and the flow of contrast was observed. Radiographs were obtained for documentation purposes.   The injectate was administered into the level noted above.  Additional Comments:  The patient tolerated the procedure well Dressing: 2 x 2 sterile gauze and Band-Aid    Post-procedure details: Patient was observed during the procedure. Post-procedure instructions were reviewed.  Patient left the clinic in stable condition.   Clinical History: CLINICAL DATA:  Right radicular pain   EXAM: MRI CERVICAL SPINE WITHOUT CONTRAST   TECHNIQUE: Multiplanar, multisequence MR imaging of the cervical spine was performed. No intravenous contrast was administered.   COMPARISON:  MRI of the cervical spine dated 09/12/2019   FINDINGS:  Alignment: Grade 1 anterolisthesis of C4 on C5.   Vertebrae: Degenerative endplate marrow changes at a few levels. No acute fracture is identified.   Cord: Normal signal and morphology.   Posterior Fossa, vertebral  arteries, paraspinal tissues: The visualized portions of the skull base and the posterior fossa are normal. No soft tissue abnormality is identified.   Disc levels:   C2-C3: The disk is normal in configuration. Mild bilateral facet arthropathy. No uncovertebral joint disease. No neuroforaminal stenosis. No spinal canal stenosis.   C3-C4: Disc osteophyte complex. Mild bilateral facet arthropathy. Mild bilateral uncovertebral joint disease. No neuroforaminal stenosis. Mild spinal canal stenosis.   C4-C5: Disc osteophyte complex. Mild bilateral facet arthropathy. No uncovertebral joint disease. No neuroforaminal stenosis. No spinal canal stenosis.   C5-C6: Disc osteophyte complex. No facet arthropathy. Mild right uncovertebral joint disease. Mild right neuroforaminal stenosis. No spinal canal stenosis.   C6-C7: Disc osteophyte complex. No facet arthropathy. Severe left uncovertebral joint disease. Severe left neuroforaminal stenosis. No spinal canal stenosis.   C7-T1: Disc osteophyte complex. Mild facet arthropathy. Mild bilateral uncovertebral joint disease. Mild bilateral neuroforaminal stenosis. No spinal canal stenosis.   IMPRESSION: 1. 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 Mild foraminal stenoses at C5-C6 and C7-T1. 2. Mild canal stenosis at C3-C4 secondary to disc osteophyte complex     Electronically Signed   By: Clem Savory M.D.   On: 01/16/2024 16:35     Objective:  VS:  HT:    WT:   BMI:     BP:(!) 146/90  HR:83bpm  TEMP: ( )  RESP:  Physical Exam Vitals and nursing note reviewed.  Constitutional:      General: She is not in acute distress.    Appearance: Normal appearance. She is not ill-appearing.  HENT:     Head: Normocephalic and atraumatic.     Right Ear: External ear normal.     Left Ear: External ear normal.  Eyes:     Extraocular Movements: Extraocular movements intact.  Cardiovascular:     Rate  and Rhythm: Normal rate.     Pulses: Normal pulses.  Musculoskeletal:     Cervical back: Tenderness present. No rigidity.     Right lower leg: No edema.     Left lower leg: No edema.     Comments: Patient has good strength in the upper extremities including 5 out of 5 strength in wrist extension long finger flexion and APB.  There is no atrophy of the hands intrinsically.  There is a negative Hoffmann's test.   Lymphadenopathy:     Cervical: No cervical adenopathy.  Skin:    Findings: No erythema, lesion or rash.  Neurological:     General: No focal deficit present.     Mental Status: She is alert and oriented to person, place, and time.     Sensory: No sensory deficit.     Motor: No weakness or abnormal muscle tone.     Coordination: Coordination normal.  Psychiatric:        Mood and Affect: Mood normal.        Behavior: Behavior normal.      Imaging: No results found.

## 2024-02-21 DIAGNOSIS — C9001 Multiple myeloma in remission: Secondary | ICD-10-CM | POA: Diagnosis not present

## 2024-02-21 DIAGNOSIS — Z9484 Stem cells transplant status: Secondary | ICD-10-CM | POA: Diagnosis not present

## 2024-02-27 ENCOUNTER — Other Ambulatory Visit (HOSPITAL_BASED_OUTPATIENT_CLINIC_OR_DEPARTMENT_OTHER): Payer: Self-pay | Admitting: Registered Nurse

## 2024-02-27 ENCOUNTER — Ambulatory Visit (HOSPITAL_BASED_OUTPATIENT_CLINIC_OR_DEPARTMENT_OTHER)
Admission: RE | Admit: 2024-02-27 | Discharge: 2024-02-27 | Disposition: A | Discharge status: Home / Self Care | Source: Ambulatory Visit | Attending: Registered Nurse | Admitting: Registered Nurse

## 2024-02-27 DIAGNOSIS — C9000 Multiple myeloma not having achieved remission: Secondary | ICD-10-CM | POA: Insufficient documentation

## 2024-02-27 DIAGNOSIS — R109 Unspecified abdominal pain: Secondary | ICD-10-CM | POA: Insufficient documentation

## 2024-02-27 DIAGNOSIS — Z90710 Acquired absence of both cervix and uterus: Secondary | ICD-10-CM | POA: Diagnosis not present

## 2024-02-27 DIAGNOSIS — S3210XA Unspecified fracture of sacrum, initial encounter for closed fracture: Secondary | ICD-10-CM | POA: Diagnosis not present

## 2024-02-27 DIAGNOSIS — Z9049 Acquired absence of other specified parts of digestive tract: Secondary | ICD-10-CM | POA: Diagnosis not present

## 2024-02-27 MED ORDER — IOHEXOL 300 MG/ML  SOLN
100.0000 mL | Freq: Once | INTRAMUSCULAR | Status: AC | PRN
  Administered 2024-02-27: 100 mL via INTRAVENOUS

## 2024-03-12 ENCOUNTER — Other Ambulatory Visit: Payer: Self-pay | Admitting: Hematology

## 2024-03-13 ENCOUNTER — Other Ambulatory Visit: Payer: Self-pay

## 2024-03-13 MED ORDER — LENALIDOMIDE 10 MG PO CAPS: 10.0000 mg | ORAL_CAPSULE | Freq: Every day | ORAL | 0 refills | Status: DC

## 2024-04-01 ENCOUNTER — Encounter: Payer: Self-pay | Admitting: Radiology

## 2024-04-11 ENCOUNTER — Telehealth: Payer: Self-pay

## 2024-04-11 NOTE — Telephone Encounter (Signed)
 Received referral from Beverley Drinkard FNP-CS at [facility] - [phone] - for sleep study eval due to HTN, obesity, snoring, witnessed apnea and daytime somnolence. Placed in sleep mailbox

## 2024-04-19 ENCOUNTER — Other Ambulatory Visit: Payer: Self-pay | Admitting: Hematology

## 2024-04-19 MED ORDER — LENALIDOMIDE 10 MG PO CAPS: 10.0000 mg | ORAL_CAPSULE | Freq: Every day | ORAL | 0 refills | Status: DC

## 2024-04-22 ENCOUNTER — Other Ambulatory Visit: Payer: Self-pay

## 2024-04-30 NOTE — Assessment & Plan Note (Signed)
 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   -bone marrow biopsy on 07/30/20 showed slightly hypercellular marrow for age and 31% plasma cells. Cytogenetics and FISH were unremarkable -she received VRD (weekly Velcade and dexamethasone, Revlimid 2 weeks on 1 week off) 08/17/20 - June 2022. She responded well. -She underwent high-dose chemo with melphalan and autologous stem cell transplant at [facility] 12/23/20 by Dr. Rosaria Ferries.  White cells engrafted 01/03/21 and platelets 01/13/21, last platelet transfusion 01/02/21 -posttreatment PET 04/06/21 showed NED  -posttreatment bone marrow biopsy 04/29/21 showed 10-20% hypocellular marrow with myeloid hypoplasia and 3% plasma cells -s/p prophylactic valacyclovir for 1 year and dapsone for 6 months posttransplant -she will continue revaccination per [facility] protocol -she began maintenance Revlimid 10 mg daily days 1-21 q. 28 days on 05/03/21. She is tolerating well with no noticeable side effects. will continue indefinitely or until disease progression. -Her last MM panel showed negative M-protein, but slightly increase Kappa light chain level in 02/2023, overall  stable  -Revlimid was briefly held around her shoulder replacement in 27 February 2023.  She has restarted.

## 2024-05-01 ENCOUNTER — Inpatient Hospital Stay: Admitting: Hematology

## 2024-05-01 ENCOUNTER — Inpatient Hospital Stay: Attending: Hematology

## 2024-05-01 VITALS — BP 124/86 | HR 78 | Temp 97.8°F | Resp 16 | Ht 65.0 in | Wt 218.6 lb

## 2024-05-01 DIAGNOSIS — C9001 Multiple myeloma in remission: Secondary | ICD-10-CM | POA: Insufficient documentation

## 2024-05-01 DIAGNOSIS — D649 Anemia, unspecified: Secondary | ICD-10-CM | POA: Diagnosis not present

## 2024-05-01 DIAGNOSIS — F419 Anxiety disorder, unspecified: Secondary | ICD-10-CM | POA: Diagnosis not present

## 2024-05-01 DIAGNOSIS — Z7961 Long term (current) use of immunomodulator: Secondary | ICD-10-CM | POA: Insufficient documentation

## 2024-05-01 LAB — CBC WITH DIFFERENTIAL (CANCER CENTER ONLY)
Abs Immature Granulocytes: 0.02 K/uL (ref 0.00–0.07)
Basophils Absolute: 0 K/uL (ref 0.0–0.1)
Basophils Relative: 1 %
Eosinophils Absolute: 0.2 K/uL (ref 0.0–0.5)
Eosinophils Relative: 4 %
HCT: 34 % — ABNORMAL LOW (ref 36.0–46.0)
Hemoglobin: 11.4 g/dL — ABNORMAL LOW (ref 12.0–15.0)
Immature Granulocytes: 0 %
Lymphocytes Relative: 25 %
Lymphs Abs: 1.4 K/uL (ref 0.7–4.0)
MCH: 24.8 pg — ABNORMAL LOW (ref 26.0–34.0)
MCHC: 33.5 g/dL (ref 30.0–36.0)
MCV: 74.1 fL — ABNORMAL LOW (ref 80.0–100.0)
Monocytes Absolute: 0.3 K/uL (ref 0.1–1.0)
Monocytes Relative: 5 %
Neutro Abs: 3.5 K/uL (ref 1.7–7.7)
Neutrophils Relative %: 65 %
Platelet Count: 170 K/uL (ref 150–400)
RBC: 4.59 MIL/uL (ref 3.87–5.11)
RDW: 16.1 % — ABNORMAL HIGH (ref 11.5–15.5)
WBC Count: 5.4 K/uL (ref 4.0–10.5)
nRBC: 0 % (ref 0.0–0.2)

## 2024-05-01 LAB — CMP (CANCER CENTER ONLY)
ALT: 10 U/L (ref 0–44)
AST: 18 U/L (ref 15–41)
Albumin: 4.1 g/dL (ref 3.5–5.0)
Alkaline Phosphatase: 98 U/L (ref 38–126)
Anion gap: 8 (ref 5–15)
BUN: 10 mg/dL (ref 8–23)
CO2: 29 mmol/L (ref 22–32)
Calcium: 9 mg/dL (ref 8.9–10.3)
Chloride: 106 mmol/L (ref 98–111)
Creatinine: 0.79 mg/dL (ref 0.44–1.00)
GFR, Estimated: >60 mL/min (ref >60)
Glucose, Bld: 77 mg/dL (ref 70–99)
Potassium: 3.6 mmol/L (ref 3.5–5.1)
Sodium: 143 mmol/L (ref 135–145)
Total Bilirubin: 0.3 mg/dL (ref 0.0–1.2)
Total Protein: 7.5 g/dL (ref 6.5–8.1)

## 2024-05-01 NOTE — Progress Notes (Signed)
 [facility]   Telephone:([phone] [phone]   Clinic Follow up Note   Patient Care Team: Tisovec, Charlie ORN, MD as PCP - General (Internal Medicine) Dolphus Reiter, MD as Consulting Physician (Rheumatology) Lanny Callander, MD as Consulting Physician (Hematology) Gloriann Chick, MD as Consulting Physician (Obstetrics and Gynecology) Ezzard Rolin BIRCH, LCSW as Social Worker (Licensed Clinical Social Worker)  Date of Service:  05/01/2024  CHIEF COMPLAINT: f/u of MM  CURRENT THERAPY:  Maintenance Revlimid  10 mg daily, 3 weeks on and 1 week off  Oncology History   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   -bone marrow biopsy on 07/30/20 showed slightly hypercellular marrow for age and 31% plasma cells. Cytogenetics and FISH were unremarkable -she received VRD (weekly Velcade  and dexamethasone , Revlimid  2 weeks on 1 week off) 08/17/20 - June 2022. She responded well. -She underwent high-dose chemo with melphalan and autologous stem cell transplant at [facility] 12/23/20 by Dr. Arman.  White cells engrafted 01/03/21 and platelets 01/13/21, last platelet transfusion 01/02/21 -posttreatment PET 04/06/21 showed NED  -posttreatment bone marrow biopsy 04/29/21 showed 10-20% hypocellular marrow with myeloid hypoplasia and 3% plasma cells -s/p prophylactic valacyclovir  for 1 year and dapsone for 6 months posttransplant -she will continue revaccination per [facility]  protocol -she began maintenance Revlimid  10 mg daily days 1-21 q. 28 days on 05/03/21. She is tolerating well with no noticeable side effects. will continue indefinitely or until disease progression. -Her last MM panel showed negative M-protein, but slightly increase Kappa light chain level in 02/2023, overall  stable  -Revlimid  was briefly held around her shoulder replacement in 27 February 2023.  She has restarted.  Assessment & Plan Multiple myeloma in remission on lenalidomide  maintenance Multiple myeloma remains in remission with stable disease. Recent blood tests show mild anemia, likely secondary to lenalidomide . Previous tests in September showed negative spike, normal immunoglobulin, and moderate elevated light chain ratio, consistent with remission. Discussed potential future use of CAR T therapy if disease progresses, noting its effectiveness and side effects. - Continue lenalidomide  maintenance therapy with three weeks on and one week off. - We discussed other treatment options, especially CAR T therapy if disease progresses. - We discussed the slightly increased risk of secondary malignancy from lenalidomide , she is up-to-date for cancer screening.  Mild anemia secondary to lenalidomide  Mild anemia likely secondary to lenalidomide  use. Blood counts are otherwise stable.  Compulsive cuticle picking (skin picking behavior) Reports compulsive cuticle picking, causing soreness. Behavior may be related to anxiety. - Consider wearing gloves to prevent picking. - Trim nails to reduce risk of injury.  Plan - She is clinically doing well, and tolerating maintenance lenalidomide  well. - Lab reviewed, will continue lenalidomide  - Lab and follow-up in 3 months.   SUMMARY OF ONCOLOGIC HISTORY: Oncology History Overview Note  Cancer Staging Multiple myeloma (HCC) Staging form: Plasma Cell Myeloma and Plasma Cell Disorders, AJCC 8th Edition - Clinical stage from 07/20/2020:  Beta-2 -microglobulin (mg/L): 2.4, Albumin (g/dL): 3.2, ISS: Stage II, High-risk cytogenetics:  Unknown, LDH: Normal - Signed by Lanny Callander, MD on 08/02/2020 Beta 2 microglobulin range (mg/L): Less than 3.5 Albumin range (g/dL): Less than 3.5 Cytogenetics: Unknown    Multiple myeloma in remission (HCC)  07/03/2020 Imaging   MRI Lumbar Spine  IMPRESSION: 1. Numerous T1 hypointense and STIR hyperintense lesions throughout the visualized spine and sacrum with multiple areas of extraosseous extension in the sacrum, detailed above and compatible with multiple myeloma given the clinical history. 2. An MRI of the pelvis with contrast could evaluate the full extent of the partially imaged sacral lesions, including left S1-S2 neural foraminal involvement and suspected involvement of the exited left L5 nerve. 3. Multilevel degenerative change without significant canal or foraminal stenosis in the lumbar spine.   07/13/2020 Initial Diagnosis   Multiple myeloma (HCC)   07/20/2020 Cancer Staging   Staging form: Plasma Cell Myeloma and Plasma Cell Disorders, AJCC 8th Edition - Clinical stage from 07/20/2020: Beta-2 -microglobulin (mg/L): 2.4, Albumin (g/dL): 3.2, ISS: Stage II, High-risk cytogenetics: Unknown, LDH: Normal - Signed by Lanny Callander, MD on 03/09/2022 Stage prefix: Initial diagnosis Beta 2 microglobulin range (mg/L): Less than 3.5 Albumin range (g/dL): Less than 3.5 Cytogenetics: No abnormalities Bone disease on imaging: Present   07/24/2020 PET scan   IMPRESSION: 1. Moderate to high hypermetabolic activity associated with the expansile soft tissue masses within the LEFT and RIGHT pelvic bones is consistent with active multiple myeloma / plasmacytoma. 2. Lytic lesion in the LEFT scapula with mild metabolic activity is indeterminate. 3. Metabolic activity associated with the RIGHT knee prosthetic and RIGHT distal foot fusion is favored post procedural inflammation.     07/30/2020 - 08/12/2020  Radiation Therapy   Palliative Radiation to Pelvic lesions with Dr Dewey    07/30/2020 Pathology Results   DIAGNOSIS:   BONE MARROW, ASPIRATE, CLOT, CORE:  -Normocellular to slightly hypercellular bone marrow for age with  plasmacytosis  -See comment   PERIPHERAL BLOOD:  -Microcytic-normochromic anemia   COMMENT:   The bone marrow shows increased number of atypical plasma cells  representing 31% of all cells in the aspirate associated with prominent  interstitial infiltrates and numerous variably sized clusters in the  clot and biopsy sections.  The findings are most consistent with  persistent/recurrent/previously known plasma cell neoplasm.  For  completeness, immunohistochemical stain for CD138 and in situ  hybridization for kappa and lambda will be performed and the results  reported in an addendum.  Correlation with cytogenetic and FISH studies  is recommended.   08/03/2020 -  Chemotherapy   Zometa  q4weeks starting 08/03/20    08/17/2020 - 11/29/2020 Chemotherapy   Velcade  weekly, Revlimid , dex 20mg  weekly (VRd)  Starting 08/17/20. Last dose Velcade  11/23/20 and last dose revlimid  on 11/29/20       Discussed the use of AI scribe software for clinical note transcription with the patient, who gave verbal consent to proceed.  History of Present Illness Hannah [name] is a 63 year old female with multiple myeloma who presents for follow-up.  She is on lenalidomide  three weeks on and one week off. A recent insurance change caused a refill delay, and she will miss several days of this cycle. She completed two years of Zometa . Labs today show mild anemia with otherwise acceptable counts. Her September myeloma panel showed no M spike, normal immunoglobulins, and a moderately elevated but stable light chain level.  She has urgency and frequent stools and is scheduled to see gastroenterology this week.  She has new compulsive picking  at her cuticles that she attributes to anxiety and is  not taking her anxiety medication consistently.  Her current medications are lenalidomide , vitamin D , and calcium . She no longer uses oxycodone  or Robaxin . She follows with [facility]. Her husband is a disabled veteran, and she is not working, which contributed to her current insurance change affecting lenalidomide  access.     All other systems were reviewed with the patient and are negative.  MEDICAL HISTORY:  Past Medical History:  Diagnosis Date   Allergy    Anemia    Anxiety    Arthritis    Asthma    Cancer (HCC)    multiple myeloma   Depression    Hypertension     SURGICAL HISTORY: Past Surgical History:  Procedure Laterality Date   ABDOMINAL HYSTERECTOMY     BICEPT TENODESIS Right 01/31/2023   Procedure: BICEPS TENODESIS;  Surgeon: Addie Cordella Hamilton, MD;  Location: [facility] OR;  Service: Orthopedics;  Laterality: Right;   CHOLECYSTECTOMY  1993   COLONOSCOPY     HERNIA REPAIR     REVERSE SHOULDER ARTHROPLASTY Right 01/31/2023   Procedure: REVERSE SHOULDER ARTHROPLASTY;  Surgeon: Addie Cordella Hamilton, MD;  Location: [facility] OR;  Service: Orthopedics;  Laterality: Right;   TOTAL KNEE ARTHROPLASTY Right 01/28/2016   TOTAL KNEE ARTHROPLASTY Right 01/28/2016   Procedure: RIGHT TOTAL KNEE ARTHROPLASTY;  Surgeon: Kay CHRISTELLA Cummins, MD;  Location: [facility] OR;  Service: Orthopedics;  Laterality: Right;   TRIGGER FINGER RELEASE Right 06/18/2014   Procedure: RIGHT LONG FINGER TRIGGER RELEASE;  Surgeon: Kay Ozell Cummins, MD;  Location: [facility];  Service: Orthopedics;  Laterality: Right;   TUBAL LIGATION     VAGINAL HYSTERECTOMY Bilateral 11/04/2016   Procedure: HYSTERECTOMY VAGINAL with Bilateral Salpingectomy;  Surgeon: Raeanne Shanda SQUIBB, MD;  Location: [facility];  Service: Gynecology;  Laterality: Bilateral;   WEIL OSTEOTOMY Right 07/07/2020   Procedure: RIGHT FOOT WEIL OSTEOTOMY 2, 3, AND 4 METATARSALS AND PROXIMAL INTERPHALANGEAL JOINT FUSION 2 & 4 TOES;  Surgeon: Harden Jerona GAILS, MD;  Location: [facility];  Service: Orthopedics;  Laterality: Right;    I have reviewed the social history and family history with the patient and they are unchanged from previous note.  ALLERGIES:  is allergic to elemental sulfur and sulfa antibiotics.  MEDICATIONS:  Current Outpatient Medications  Medication Sig Dispense Refill   acetaminophen  (TYLENOL ) 650 MG CR tablet 2 tablets as needed Orally every 8 hrs     AIRSUPRA 90-80 MCG/ACT AERO 2 puffs as needed Inhalation Six times a day for 30 days     aspirin  EC 81 MG tablet Take 81 mg by mouth daily. Swallow whole.     calcium  carbonate (OSCAL) 1500 (600 Ca) MG TABS tablet Take 600 mg of elemental calcium  by mouth daily.     Cholecalciferol (VITAMIN D3) 1.25 MG (50000 UT) CAPS Take 1 capsule by mouth every Sunday.     docusate sodium  (COLACE) 100 MG capsule Take 1 capsule (100 mg total) by mouth 2 (two) times daily. (Patient not taking: No sig reported) 10 capsule 0   ferrous sulfate  325 (65 FE) MG tablet Take 325 mg by mouth 2 (two) times daily with a meal.     lenalidomide  (REVLIMID ) 10 MG capsule Take 1 capsule (10 mg total) by mouth daily. [employer] Auth #[id number]  Date Obtained 04/19/2024 TAKE 1 CAPSULE BY MOUTH 1 TIME A DAY FOR 21 DAYS ON THEN 7 DAYS OFF for a 28-day  cycle  Generic can be dispensed in place of the brand 21 capsule 0   montelukast (SINGULAIR) 10 MG tablet TAKE 1 TABLET BY MOUTH EVERY DAY FOR 30 DAYS for 90     naproxen  (NAPROSYN ) 250 MG tablet Take 1 tablet (250 mg total) by mouth 2 (two) times daily with a meal. 30 tablet 0   sertraline  (ZOLOFT ) 50 MG tablet Take 50 mg by mouth daily.     valACYclovir  (VALTREX ) 500 MG tablet Take 500 mg by mouth 2 (two) times daily.     No current facility-administered medications for this visit.    PHYSICAL EXAMINATION: ECOG PERFORMANCE STATUS: 0 - Asymptomatic  Vitals:   05/01/24 0920  BP: 124/86  Pulse: 78  Resp: 16  Temp: 97.8 F (36.6 C)  SpO2: 95%    Wt Readings from Last 3 Encounters:  05/01/24 218 lb 9.6 oz (99.2 kg)  01/03/24 210 lb (95.3 kg)  12/13/23 214 lb 6.4 oz (97.3 kg)     GENERAL:alert, no distress and comfortable SKIN: skin color, texture, turgor are normal, no rashes or significant lesions EYES: normal, Conjunctiva are pink and non-injected, sclera clear NECK: supple, thyroid normal size, non-tender, without nodularity LYMPH:  no palpable lymphadenopathy in the cervical, axillary  LUNGS: clear to auscultation and percussion with normal breathing effort HEART: regular rate & rhythm and no murmurs and no lower extremity edema ABDOMEN:abdomen soft, non-tender and normal bowel sounds Musculoskeletal:no cyanosis of digits and no clubbing  NEURO: alert & oriented x 3 with fluent speech, no focal motor/sensory deficits  Physical Exam    LABORATORY DATA:  I have reviewed the data as listed    Latest Ref Rng & Units 05/01/2024    8:47 AM 12/13/2023   10:55 AM 08/02/2023    8:39 AM  CBC  WBC 4.0 - 10.5 K/uL 5.4  5.3  4.4   Hemoglobin 12.0 - 15.0 g/dL 88.5  88.5  88.6   Hematocrit 36.0 - 46.0 % 34.0  34.4  34.2   Platelets 150 - 400 K/uL 170  276  199         Latest Ref Rng & Units 05/01/2024    8:47 AM 12/13/2023   10:55 AM 08/02/2023    8:39 AM  CMP  Glucose 70 - 99 mg/dL 77  92  880   BUN 8 - 23 mg/dL 10  15  15    Creatinine 0.44 - 1.00 mg/dL 9.20  9.16  9.18   Sodium 135 - 145 mmol/L 143  142  141   Potassium 3.5 - 5.1 mmol/L 3.6  3.5  3.5   Chloride 98 - 111 mmol/L 106  106  106   CO2 22 - 32 mmol/L 29  30  29    Calcium  8.9 - 10.3 mg/dL 9.0  8.8  8.7   Total Protein 6.5 - 8.1 g/dL 7.5  7.3  7.0   Total Bilirubin 0.0 - 1.2 mg/dL 0.3  0.4  0.5   Alkaline Phos 38 - 126 U/L 98  68  79   AST 15 - 41 U/L 18  13  12    ALT 0 - 44 U/L 10  11  8        RADIOGRAPHIC STUDIES: I have personally reviewed the radiological images as listed and agreed with the findings in the report. No results found.    No orders  of the defined types were placed in this encounter.  All questions were answered. The patient knows to call  the clinic with any problems, questions or concerns. No barriers to learning was detected. The total time spent in the appointment was 25 minutes, including review of chart and various tests results, discussions about plan of care and coordination of care plan     Onita Mattock, MD 05/01/2024

## 2024-05-02 ENCOUNTER — Ambulatory Visit: Admitting: Hematology

## 2024-05-02 ENCOUNTER — Other Ambulatory Visit

## 2024-05-02 LAB — KAPPA/LAMBDA LIGHT CHAINS
Kappa free light chain: 31.5 mg/L — ABNORMAL HIGH (ref 3.3–19.4)
Kappa, lambda light chain ratio: 1.53 (ref 0.26–1.65)
Lambda free light chains: 20.6 mg/L (ref 5.7–26.3)

## 2024-05-06 LAB — MULTIPLE MYELOMA PANEL, SERUM
Albumin SerPl Elph-Mcnc: 3.6 g/dL (ref 2.9–4.4)
Albumin/Glob SerPl: 1.1 (ref 0.7–1.7)
Alpha 1: 0.2 g/dL (ref 0.0–0.4)
Alpha2 Glob SerPl Elph-Mcnc: 0.6 g/dL (ref 0.4–1.0)
B-Globulin SerPl Elph-Mcnc: 1 g/dL (ref 0.7–1.3)
Gamma Glob SerPl Elph-Mcnc: 1.4 g/dL (ref 0.4–1.8)
Globulin, Total: 3.3 g/dL (ref 2.2–3.9)
IgA: 225 mg/dL (ref 87–352)
IgG (Immunoglobin G), Serum: 1644 mg/dL — ABNORMAL HIGH (ref 586–1602)
IgM (Immunoglobulin M), Srm: 24 mg/dL — ABNORMAL LOW (ref 26–217)
Total Protein ELP: 6.9 g/dL (ref 6.0–8.5)

## 2024-05-29 ENCOUNTER — Other Ambulatory Visit: Payer: Self-pay | Admitting: Hematology

## 2024-05-29 ENCOUNTER — Other Ambulatory Visit: Payer: Self-pay

## 2024-05-29 MED ORDER — LENALIDOMIDE 10 MG PO CAPS: 10.0000 mg | ORAL_CAPSULE | Freq: Every day | ORAL | 0 refills | Status: DC

## 2024-06-07 ENCOUNTER — Ambulatory Visit: Admitting: Physician Assistant

## 2024-06-07 ENCOUNTER — Encounter: Payer: Self-pay | Admitting: Physician Assistant

## 2024-06-07 ENCOUNTER — Other Ambulatory Visit: Payer: Self-pay

## 2024-06-07 DIAGNOSIS — M79641 Pain in right hand: Secondary | ICD-10-CM | POA: Diagnosis not present

## 2024-06-07 DIAGNOSIS — M67431 Ganglion, right wrist: Secondary | ICD-10-CM | POA: Insufficient documentation

## 2024-06-07 NOTE — Progress Notes (Signed)
 "  Office Visit Note   Patient: Hannah [name]           Date of Birth: March 25, 1961           MRN: [id number] Visit Date: 06/07/2024              Requested by: Tisovec, Richard W, MD [address] [place],  [place] [address] PCP: Vernadine Charlie ORN, MD   Assessment & Plan: Visit Diagnoses:  1. Pain of right hand   2. Ganglion cyst of dorsum of right wrist     Plan: Hannah [name] is a pleasant right-hand-dominant 64 year old woman who comes in today with a about a month history of a lump the top of her hand and wrist.  She denies any injury she does say that it seems to get bigger and then goes away.  She denies any pain today.  She denies any paresthesias in her fingers.  She does have findings consistent with a ganglion cyst of the dorsum of the right wrist.  We talked about the natural history of this.  She does not have to have any treatment if she does not want to.  If it becomes painful or she would like to seek treatment she could see Dr. Jerri who has done trigger releases on her hands before.  She contact us  if she gets any redness or pain  Follow-Up Instructions: Return if symptoms worsen or fail to improve.   Orders:  Orders Placed This Encounter  Procedures   XR Hand 2 View Right   No orders of the defined types were placed in this encounter.     Procedures: No procedures performed   Clinical Data: No additional findings.   Subjective: Chief Complaint  Patient presents with   Right Hand - Pain   Lump    HPI patient is a pleasant 64 year old right-hand-dominant woman comes in today with a 1 month history of a mass on the top of her hand and wrist.  She denies any injury.  She does have a history of multiple myeloma which is in remission.  She has no pain and says this mass comes and goes  Review of Systems  All other systems reviewed and are negative.    Objective: Vital Signs: LMP 10/22/2016   Physical Exam Constitutional:      Appearance: Normal appearance.   Pulmonary:     Effort: Pulmonary effort is normal.  Skin:    General: Skin is warm and dry.  Neurological:     General: No focal deficit present.     Mental Status: She is alert and oriented to person, place, and time.  Psychiatric:        Mood and Affect: Mood normal.        Behavior: Behavior normal.     Ortho Exam Examination of her right hand she has a strong pulse she has good grip strength brisk capillary refill.  She has good extension and flexion of her wrist without pain she has a mobile cystic mass on the dorsum of her wrist which is not tender not red consistent with ganglion cyst Specialty Comments:  CLINICAL DATA:  Right radicular pain   EXAM: MRI CERVICAL SPINE WITHOUT CONTRAST   TECHNIQUE: Multiplanar, multisequence MR imaging of the cervical spine was performed. No intravenous contrast was administered.   COMPARISON:  MRI of the cervical spine dated 09/12/2019   FINDINGS: Alignment: Grade 1 anterolisthesis of C4 on C5.   Vertebrae: Degenerative endplate marrow changes at  a few levels. No acute fracture is identified.   Cord: Normal signal and morphology.   Posterior Fossa, vertebral arteries, paraspinal tissues: The visualized portions of the skull base and the posterior fossa are normal. No soft tissue abnormality is identified.   Disc levels:   C2-C3: The disk is normal in configuration. Mild bilateral facet arthropathy. No uncovertebral joint disease. No neuroforaminal stenosis. No spinal canal stenosis.   C3-C4: Disc osteophyte complex. Mild bilateral facet arthropathy. Mild bilateral uncovertebral joint disease. No neuroforaminal stenosis. Mild spinal canal stenosis.   C4-C5: Disc osteophyte complex. Mild bilateral facet arthropathy. No uncovertebral joint disease. No neuroforaminal stenosis. No spinal canal stenosis.   C5-C6: Disc osteophyte complex. No facet arthropathy. Mild right uncovertebral joint disease. Mild right neuroforaminal  stenosis. No spinal canal stenosis.   C6-C7: Disc osteophyte complex. No facet arthropathy. Severe left uncovertebral joint disease. Severe left neuroforaminal stenosis. No spinal canal stenosis.   C7-T1: Disc osteophyte complex. Mild facet arthropathy. Mild bilateral uncovertebral joint disease. Mild bilateral neuroforaminal stenosis. No spinal canal stenosis.   IMPRESSION: 1. 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 Mild foraminal stenoses at C5-C6 and C7-T1. 2. Mild canal stenosis at C3-C4 secondary to disc osteophyte complex     Electronically Signed   By: Clem Savory M.D.   On: 01/16/2024 16:35  Imaging: XR Hand 2 View Right Result Date: 06/07/2024 Radiographs of the right hand were obtained today.  No evidence of any significant degenerative changes or fractures no evidence of any bony tumors.  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   Major depression, recurrent 01/12/2018   Chronic right shoulder pain 12/20/2017   MDD (major depressive disorder), recurrent episode, moderate (HCC) 11/04/2017   Primary osteoarthritis of both hands 09/26/2017   Primary osteoarthritis of right shoulder 09/26/2017   Primary osteoarthritis of both hips 09/26/2017   Status post total knee replacement, right 09/26/2017   Primary osteoarthritis of both feet 09/26/2017   History of asthma 09/26/2017   Primary osteoarthritis of left knee 04/18/2017    Menorrhagia 11/04/2016   Fibroid tumor 11/04/2016   Adenomyosis 11/04/2016   Hypertension, essential 11/04/2016   Total knee replacement status 01/28/2016   Past Medical History:  Diagnosis Date   Allergy    Anemia    Anxiety    Arthritis    Asthma    Cancer (HCC)    multiple myeloma   Depression    Hypertension     Family History  Problem Relation Age of Onset   Cancer Mother        uterine    Asthma Mother    Depression Maternal Uncle    Arthritis Daughter    Colon cancer Neg Hx    Colon polyps Neg Hx    Diabetes Neg Hx    Rectal cancer Neg Hx    Stomach cancer Neg Hx     Past Surgical History:  Procedure Laterality Date  ABDOMINAL HYSTERECTOMY     BICEPT TENODESIS Right 01/31/2023   Procedure: BICEPS TENODESIS;  Surgeon: Addie Cordella Hamilton, MD;  Location: [facility] OR;  Service: Orthopedics;  Laterality: Right;   CHOLECYSTECTOMY  1993   COLONOSCOPY     HERNIA REPAIR     REVERSE SHOULDER ARTHROPLASTY Right 01/31/2023   Procedure: REVERSE SHOULDER ARTHROPLASTY;  Surgeon: Addie Cordella Hamilton, MD;  Location[facility] OR;  Service: Orthopedics;  Laterality: Right;   TOTAL KNEE ARTHROPLASTY Right 01/28/2016   TOTAL KNEE ARTHROPLASTY Right 01/28/2016   Procedure: RIGHT TOTAL KNEE ARTHROPLASTY;  Surgeon: Kay CHRISTELLA Cummins, MD;  Location: [facility] OR;  Service: Orthopedics;  Laterality: Right;   TRIGGER FINGER RELEASE Right 06/18/2014   Procedure: RIGHT LONG FINGER TRIGGER RELEASE;  Surgeon: Kay Ozell Cummins, MD;  Location: [facility];  Service: Orthopedics;  Laterality: Right;   TUBAL LIGATION     VAGINAL HYSTERECTOMY Bilateral 11/04/2016   Procedure: HYSTERECTOMY VAGINAL with Bilateral Salpingectomy;  Surgeon: Raeanne Shanda SQUIBB, MD;  Location: [facility]S;  Service: Gynecology;  Laterality: Bilateral;   WEIL OSTEOTOMY Right 07/07/2020   Procedure: RIGHT FOOT WEIL OSTEOTOMY 2, 3, AND 4 METATARSALS AND PROXIMAL INTERPHALANGEAL JOINT FUSION 2 & 4 TOES;  Surgeon: Harden Jerona GAILS, MD;   Location: [facility];  Service: Orthopedics;  Laterality: Right;   Social History   Occupational History   Not on file  Tobacco Use   Smoking status: Never   Smokeless tobacco: Never  Vaping Use   Vaping status: Never Used  Substance and Sexual Activity   Alcohol  use: No    Alcohol /week: 0.0 standard drinks of alcohol    Drug use: No   Sexual activity: Yes    Partners: Male    Birth control/protection: Surgical        "

## 2024-06-15 ENCOUNTER — Encounter: Payer: Self-pay | Admitting: Hematology

## 2024-06-17 ENCOUNTER — Other Ambulatory Visit: Payer: Self-pay | Admitting: Hematology

## 2024-06-17 DIAGNOSIS — Z8349 Family history of other endocrine, nutritional and metabolic diseases: Secondary | ICD-10-CM

## 2024-07-05 ENCOUNTER — Other Ambulatory Visit: Payer: Self-pay | Admitting: Hematology

## 2024-07-05 ENCOUNTER — Other Ambulatory Visit: Payer: Self-pay

## 2024-07-05 MED ORDER — LENALIDOMIDE 10 MG PO CAPS: 10.0000 mg | ORAL_CAPSULE | Freq: Every day | ORAL | 0 refills | Status: AC

## 2024-07-30 ENCOUNTER — Inpatient Hospital Stay: Admitting: Hematology

## 2024-07-30 ENCOUNTER — Inpatient Hospital Stay: Attending: Hematology
